# Patient Record
Sex: Male | Born: 1935 | Race: White | Hispanic: No | Marital: Single | Smoking: Never smoker
Health system: Southern US, Community
[De-identification: ages and names within clinical notes are randomized; demographics above are authoritative.]

## PROBLEM LIST (undated history)

## (undated) DIAGNOSIS — Z87442 Personal history of urinary calculi: Secondary | ICD-10-CM

## (undated) DIAGNOSIS — E118 Type 2 diabetes mellitus with unspecified complications: Secondary | ICD-10-CM

## (undated) DIAGNOSIS — K219 Gastro-esophageal reflux disease without esophagitis: Secondary | ICD-10-CM

## (undated) DIAGNOSIS — K635 Polyp of colon: Secondary | ICD-10-CM

## (undated) DIAGNOSIS — I499 Cardiac arrhythmia, unspecified: Secondary | ICD-10-CM

## (undated) DIAGNOSIS — M1990 Unspecified osteoarthritis, unspecified site: Secondary | ICD-10-CM

## (undated) DIAGNOSIS — N200 Calculus of kidney: Secondary | ICD-10-CM

## (undated) DIAGNOSIS — R7303 Prediabetes: Secondary | ICD-10-CM

## (undated) DIAGNOSIS — C61 Malignant neoplasm of prostate: Secondary | ICD-10-CM

## (undated) DIAGNOSIS — Z9289 Personal history of other medical treatment: Secondary | ICD-10-CM

## (undated) DIAGNOSIS — K602 Anal fissure, unspecified: Secondary | ICD-10-CM

## (undated) DIAGNOSIS — I4891 Unspecified atrial fibrillation: Secondary | ICD-10-CM

## (undated) DIAGNOSIS — M549 Dorsalgia, unspecified: Secondary | ICD-10-CM

## (undated) DIAGNOSIS — E785 Hyperlipidemia, unspecified: Secondary | ICD-10-CM

## (undated) DIAGNOSIS — I509 Heart failure, unspecified: Secondary | ICD-10-CM

## (undated) DIAGNOSIS — C439 Malignant melanoma of skin, unspecified: Secondary | ICD-10-CM

## (undated) DIAGNOSIS — Z9889 Other specified postprocedural states: Secondary | ICD-10-CM

## (undated) DIAGNOSIS — I2510 Atherosclerotic heart disease of native coronary artery without angina pectoris: Secondary | ICD-10-CM

## (undated) DIAGNOSIS — G4730 Sleep apnea, unspecified: Secondary | ICD-10-CM

## (undated) DIAGNOSIS — I10 Essential (primary) hypertension: Secondary | ICD-10-CM

## (undated) HISTORY — DX: Personal history of other medical treatment: Z92.89

## (undated) HISTORY — DX: Hyperlipidemia, unspecified: E78.5

## (undated) HISTORY — DX: Essential (primary) hypertension: I10

## (undated) HISTORY — DX: Heart failure, unspecified: I50.9

## (undated) HISTORY — DX: Anal fissure, unspecified: K60.2

## (undated) HISTORY — PX: URETHRAL STRICTURE DILATATION: SHX477

## (undated) HISTORY — DX: Other specified postprocedural states: Z98.890

## (undated) HISTORY — DX: Calculus of kidney: N20.0

## (undated) HISTORY — PX: UPPER GASTROINTESTINAL ENDOSCOPY: SHX188

## (undated) HISTORY — DX: Polyp of colon: K63.5

## (undated) HISTORY — DX: Type 2 diabetes mellitus with unspecified complications: E11.8

## (undated) HISTORY — DX: Atherosclerotic heart disease of native coronary artery without angina pectoris: I25.10

## (undated) HISTORY — DX: Prediabetes: R73.03

## (undated) HISTORY — DX: Unspecified atrial fibrillation: I48.91

## (undated) HISTORY — DX: Malignant neoplasm of prostate: C61

---
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
## 1978-08-10 HISTORY — PX: THROAT SURGERY: SHX803

## 1987-08-11 DIAGNOSIS — Z9889 Other specified postprocedural states: Secondary | ICD-10-CM

## 1987-08-11 HISTORY — DX: Other specified postprocedural states: Z98.890

## 1992-05-23 DIAGNOSIS — C4492 Squamous cell carcinoma of skin, unspecified: Secondary | ICD-10-CM

## 1992-05-23 HISTORY — DX: Squamous cell carcinoma of skin, unspecified: C44.92

## 2000-08-10 HISTORY — PX: POLYPECTOMY: SHX149

## 2000-08-10 HISTORY — PX: COLONOSCOPY: SHX174

## 2001-05-11 ENCOUNTER — Encounter (INDEPENDENT_AMBULATORY_CARE_PROVIDER_SITE_OTHER): Payer: Self-pay

## 2001-05-11 ENCOUNTER — Other Ambulatory Visit: Admission: RE | Admit: 2001-05-11 | Discharge: 2001-05-11 | Payer: Self-pay | Admitting: Gastroenterology

## 2001-06-17 ENCOUNTER — Ambulatory Visit (HOSPITAL_COMMUNITY): Admission: RE | Admit: 2001-06-17 | Discharge: 2001-06-17 | Payer: Self-pay | Admitting: Neurological Surgery

## 2001-06-17 ENCOUNTER — Encounter: Payer: Self-pay | Admitting: Neurological Surgery

## 2003-06-13 ENCOUNTER — Ambulatory Visit (HOSPITAL_COMMUNITY): Admission: RE | Admit: 2003-06-13 | Discharge: 2003-06-13 | Payer: Self-pay | Admitting: Neurological Surgery

## 2004-05-08 ENCOUNTER — Encounter: Admission: RE | Admit: 2004-05-08 | Discharge: 2004-05-08 | Payer: Self-pay | Admitting: Urology

## 2004-05-14 ENCOUNTER — Ambulatory Visit (HOSPITAL_COMMUNITY): Admission: RE | Admit: 2004-05-14 | Discharge: 2004-05-14 | Payer: Self-pay | Admitting: Urology

## 2004-05-14 ENCOUNTER — Ambulatory Visit (HOSPITAL_BASED_OUTPATIENT_CLINIC_OR_DEPARTMENT_OTHER): Admission: RE | Admit: 2004-05-14 | Discharge: 2004-05-14 | Payer: Self-pay | Admitting: Urology

## 2005-06-14 ENCOUNTER — Ambulatory Visit (HOSPITAL_COMMUNITY): Admission: RE | Admit: 2005-06-14 | Discharge: 2005-06-14 | Payer: Self-pay | Admitting: Neurological Surgery

## 2006-02-18 ENCOUNTER — Encounter: Admission: RE | Admit: 2006-02-18 | Discharge: 2006-02-18 | Payer: Self-pay | Admitting: Internal Medicine

## 2007-09-01 ENCOUNTER — Emergency Department (HOSPITAL_COMMUNITY): Admission: EM | Admit: 2007-09-01 | Discharge: 2007-09-02 | Payer: Self-pay | Admitting: Emergency Medicine

## 2008-03-16 ENCOUNTER — Ambulatory Visit (HOSPITAL_BASED_OUTPATIENT_CLINIC_OR_DEPARTMENT_OTHER): Admission: RE | Admit: 2008-03-16 | Discharge: 2008-03-16 | Payer: Self-pay | Admitting: Internal Medicine

## 2008-08-10 DIAGNOSIS — C61 Malignant neoplasm of prostate: Secondary | ICD-10-CM

## 2008-08-10 HISTORY — DX: Malignant neoplasm of prostate: C61

## 2009-01-25 ENCOUNTER — Emergency Department (HOSPITAL_COMMUNITY): Admission: EM | Admit: 2009-01-25 | Discharge: 2009-01-25 | Payer: Self-pay | Admitting: Emergency Medicine

## 2009-01-30 ENCOUNTER — Ambulatory Visit (HOSPITAL_COMMUNITY): Admission: RE | Admit: 2009-01-30 | Discharge: 2009-01-30 | Payer: Self-pay | Admitting: Urology

## 2009-02-05 ENCOUNTER — Ambulatory Visit: Admission: RE | Admit: 2009-02-05 | Discharge: 2009-04-17 | Payer: Self-pay | Admitting: Radiation Oncology

## 2009-02-28 ENCOUNTER — Encounter: Admission: RE | Admit: 2009-02-28 | Discharge: 2009-02-28 | Payer: Self-pay | Admitting: Urology

## 2009-03-13 ENCOUNTER — Ambulatory Visit (HOSPITAL_BASED_OUTPATIENT_CLINIC_OR_DEPARTMENT_OTHER): Admission: RE | Admit: 2009-03-13 | Discharge: 2009-03-13 | Payer: Self-pay | Admitting: Urology

## 2009-03-13 HISTORY — PX: INSERTION PROSTATE RADIATION SEED: SUR718

## 2010-02-13 DIAGNOSIS — Z9289 Personal history of other medical treatment: Secondary | ICD-10-CM

## 2010-02-13 HISTORY — DX: Personal history of other medical treatment: Z92.89

## 2010-11-16 LAB — COMPREHENSIVE METABOLIC PANEL
Alkaline Phosphatase: 42 U/L (ref 39–117)
BUN: 15 mg/dL (ref 6–23)
CO2: 27 mEq/L (ref 19–32)
Calcium: 9.3 mg/dL (ref 8.4–10.5)
GFR calc non Af Amer: >60 mL/min (ref >60)
Glucose, Bld: 121 mg/dL — ABNORMAL HIGH (ref 70–99)
Potassium: 4.4 mEq/L (ref 3.5–5.1)
Total Protein: 6.4 g/dL (ref 6.0–8.3)

## 2010-11-16 LAB — CBC
MCHC: 34 g/dL (ref 30.0–36.0)
MCV: 90.7 fL (ref 78.0–100.0)
RBC: 4.6 MIL/uL (ref 4.22–5.81)
RDW: 13.3 % (ref 11.5–15.5)
WBC: 6.4 10*3/uL (ref 4.0–10.5)

## 2010-11-16 LAB — PROTIME-INR
INR: 1 (ref 0.00–1.49)
Prothrombin Time: 13.9 seconds (ref 11.6–15.2)

## 2010-11-17 LAB — POCT I-STAT, CHEM 8
Chloride: 109 mEq/L (ref 96–112)
Glucose, Bld: 133 mg/dL — ABNORMAL HIGH (ref 70–99)
HCT: 38 % — ABNORMAL LOW (ref 39.0–52.0)
Hemoglobin: 12.9 g/dL — ABNORMAL LOW (ref 13.0–17.0)
Potassium: 4.6 mEq/L (ref 3.5–5.1)
Sodium: 140 mEq/L (ref 135–145)

## 2010-11-17 LAB — POCT CARDIAC MARKERS
CKMB, poc: <1 ng/mL — ABNORMAL LOW (ref 1.0–8.0)
Myoglobin, poc: 133 ng/mL (ref 12–200)
Troponin i, poc: <0.05 ng/mL (ref 0.00–0.09)

## 2010-11-17 LAB — DIFFERENTIAL
Basophils Absolute: 0 10*3/uL (ref 0.0–0.1)
Basophils Relative: 0 % (ref 0–1)
Eosinophils Absolute: 0.1 10*3/uL (ref 0.0–0.7)
Monocytes Relative: 6 % (ref 3–12)
Neutro Abs: 6.7 10*3/uL (ref 1.7–7.7)
Neutrophils Relative %: 80 % — ABNORMAL HIGH (ref 43–77)

## 2010-11-17 LAB — CBC
MCHC: 33.9 g/dL (ref 30.0–36.0)
Platelets: 133 10*3/uL — ABNORMAL LOW (ref 150–400)
RBC: 4.34 MIL/uL (ref 4.22–5.81)

## 2010-12-01 ENCOUNTER — Telehealth: Payer: Self-pay | Admitting: Cardiology

## 2010-12-01 NOTE — Telephone Encounter (Signed)
WANTED TO KNOW IF HE SHOULD COME IN TO SEE DR. Deborah Chalk. HE WAS HERE IN July 2011. PLS CALL HIM BACK TO LET HIM KNOW WHAT TO DO.

## 2010-12-02 NOTE — Telephone Encounter (Signed)
RN scheduled pt to see Dr. Deborah Chalk on 12/29/10.  Pt aware.

## 2010-12-23 NOTE — Op Note (Signed)
NAME:  Antonio Clark, Antonio Clark                 ACCOUNT NO.:  [id number]   MEDICAL RECORD NO.:  [id number]          PATIENT TYPE:  AMB   LOCATION:  [facility]                         FACILITY:  [facility]   PHYSICIAN:  Ronald L. Earlene Plater, M.D.  DATE OF BIRTH:  29-Nov-1935   DATE OF PROCEDURE:  03/13/2009  DATE OF DISCHARGE:                               OPERATIVE REPORT   DIAGNOSES:  Adenocarcinoma of the prostate and urethral stricture  disease.   OPERATIVE PROCEDURE:  Cystourethroscopy, visual internal urethrotomy,  radioactive seed implantation with iodine 125 isotope   SURGEON:  Ronald L. Earlene Plater, M.D.   ASSISTANT:  Maryln Gottron, M.D.   ANESTHESIA:  LMA.   ESTIMATED BLOOD LOSS:  15 mL.   TUBES:  A 20-French Foley.   COMPLICATIONS:  None.   A total of 91 seeds were implanted with 29 needles at 50.2320 mCi total  apparent activity.   INDICATIONS FOR PROCEDURE:  Mr. Eberlein is a very nice 75 year old [place]  male who originally had a low grade prostate cancer and underwent  expected management protocol.  He subsequently underwent a repeat biopsy  of his prostate.  His PSA was quite low at 1.88.  However, he was found  to have a nidus of Gleason score 7 or 3 + 4 and 15% on one core from the  right mid prostate and 3 + 3 or 6 from the right apex.  His metastatic  workup was negative and after understanding the risks, benefits and  alternatives he has elected proceed with seed implantation.  He has been  properly simulated and properly informed.  In addition, he has known  urethral stricture disease and has had some slowing of flow and it was  felt that we would be set up to perform an optical urethrotomy at the  time of the procedure.   PROCEDURE IN DETAIL:  The patient was placed in the supine position.  After proper LMA anesthesia was placed in the dorsal lithotomy position,  prepped and draped in a sterile fashion.  Cystourethroscopy was  performed with an optical urethrotome.  There was a  filamentous fairly  tight, deep bulbar urethral stricture just distal to the membranous  urethra.  It was approximately 5 mm in length.  Utilizing the cold knife  it was incised at the 12 o'clock position to bleeding tissue and opened  quite nicely.  The optical urethrotome was removed and a 20-French Foley  catheter was inserted with ease and the seed procedure was approached.  The transrectal ultrasound was performed with B and K biplanar  ultrasound and both axial and sagittal scans were performed.  Two  holding needles were placed in unused coordinates, utilizing both the  electronic and physical grid and replanting was performed and augmented  at surgery.  We were very comfortable with the plan as is documented in  the record.  A base needle was placed and the machine calibrated and  serial implantation was then performed and planned coordinates.  A total  of 91, I-125 seeds were planted with 29 needles with a total apparent  activity of 50.2320 mCi.  Following implantation, we were very  comfortable with the location.  All hardware was removed and a static  image was taken fluoroscopically and again, we were comfortable with the  location of the seeds.  The patient was then placed in the supine  position.  The Foley catheter was removed and scanned for seeds and  there were none within it.  Flexible cystourethroscopy was then  performed.  The urethra was widely patent.  The bladder was smooth wall  with grade 1 trabeculation.  There were no lesions noted.  Efflux of  clear urine was noted from normally placed ureteral orifices  bilaterally.  There were no seeds or spacers or lesions in the urethra  or in the bladder.  The flexible cystourethroscope was visually removed.  A new 20-French Foley catheter was inserted.  The bladder was drained  and the patient was taken to the recovery room stable.      Ronald L. Earlene Plater, M.D.  Electronically Signed     RLD/MEDQ  D:  03/13/2009   T:  03/13/2009  Job:  [id number]

## 2010-12-23 NOTE — Consult Note (Signed)
NAMEPAYTEN, BEAUMIER NO.:  [id number]   MEDICAL RECORD NO.:  [id number]          PATIENT TYPE:  EMS   LOCATION:  ED                           FACILITY:  [facility]   PHYSICIAN:  Lindaann Slough, M.D.  DATE OF BIRTH:  05-13-1936   DATE OF CONSULTATION:  09/01/2007  DATE OF DISCHARGE:  09/02/2007                                 CONSULTATION   REASON FOR CONSULTATION:  Hematuria and suprapubic pain.   The patient is 75 year old male patient of Dr. Earlene Plater who had urethral  dilation on September 01, 2007.  He returned to the office in the  afternoon and Dr. Earlene Plater did a cystoscopy and inserted a Foley catheter  in the bladder.  He did not have any blood clots in the bladder at that  time.  He was sent home; however, he returned to the emergency room in  the evening of September 01, 2007 complaining of suprapubic pain and the  catheter not draining.  The catheter was replaced with a #22 [place];  however, he continued to have pain.  The catheter was irrigated with  normal saline and there were no blood clots; however, he still continued  to complain of pain.  I was then asked to see the patient for further  evaluation.   I irrigated the catheter with normal saline and there were no blood  clots and the return from the irrigation was clear.  The patient  continued to complain of pain.  At that time I was concerned that he had  a bladder full of blood clots, and I decided to do a cystoscopy.  I  passed a flexible cystoscope in the bladder.  The anterior urethra is  normal.  The prostatic urethra is reddened and there is some edema at  the bladder neck.  There are no blood clots in the bladder.  There is no  tumor in the bladder.  The ureteral orifices are in normal position and  shape.  I then removed the cystoscope.   I inserted a #20 [place] coude catheter in the bladder and the catheter  was draining well.  I gave him an injection of 15 mg of morphine IM.   He was discharged  home and he will follow up with Dr. Earlene Plater.      Lindaann Slough, M.D.  Electronically Signed     MN/MEDQ  D:  09/02/2007  T:  09/02/2007  Job:  [id number]

## 2010-12-23 NOTE — Op Note (Signed)
NAME:  Antonio Clark, Antonio Clark                 ACCOUNT NO.:  [id number]   MEDICAL RECORD NO.:  [id number]          PATIENT TYPE:  AMB   LOCATION:  [facility]                         FACILITY:  [facility]   PHYSICIAN:  Ronald L. Earlene Plater, M.D.  DATE OF BIRTH:  [birth year]   DATE OF PROCEDURE:  03/16/2008  DATE OF DISCHARGE:                               OPERATIVE REPORT   __________   PREOPERATIVE DIAGNOSIS:  Bulbar urethral stricture.   POSTOPERATIVE DIAGNOSIS:  Bulbar urethral stricture.   PROCEDURES:  1. Cystourethroscopy.  2. DVIU of bulbar urethral stricture.  3. Placement of 20-French Foley catheter.  4. We used a 26-French resectoscope with a straight cold knife along      with a 0 degree lens and sterile water as our irrigant.   ATTENDING PHYSICIAN:  Dr. Maudie Flakes.   ANESTHESIA:  General.   INDICATIONS FOR PROCEDURE:  Antonio Clark is a 75 year old [place] male with  past medical history positive for urethral stricture.  He has had direct  visual internal urethrotomy in the past and the stricture has recurred.  He does have symptoms from this.  Preoperative counseling included  risks, benefits, consequences and concerns and options and informed  consent was obtained.   PROCEDURE IN DETAIL:  The patient was brought to the operating room and  placed in a supine position.  He was correctly identified by his  wristband and an appropriate timeout was taken.  General anesthesia was  delivered and IV antibiotics were administered.  He was placed in dorsal  lithotomy position with great care taken to minimize the risk of  peripheral neuropathy or compartment syndrome.  His perineum was prepped  and draped sterilely.  We began our procedure by performing a rigid  ureteroscopy with a 26-French rigid resectoscope.  We placed the sheath  over an obturator through a slightly narrowed fossa navicularis.  There  was some minimal bleeding from this.  The rest of the anterior urethra  was widely patent until the  bulbar urethra was encountered, where there  was a circumferential area of stricture noted by pale mucosa.  The  stricture measured approximately 1 cm.  In the background, the external  sphincter was visualized.  We were able to use a straight cold knife and  incised the stricture at the 12 o'clock position in the midline and  carried it down to pink and viable tissue.  There was no significant  bleeding.  Once our incision had been completed, we were able to advance  the resectoscope through it without any significant resistance.  We  continued our urethroscopy, which noted a normal wall membranous and  prostatic urethra with a high median bar.  Upon entering the bladder,  clear urine was identified.  Both ureteral orifices were noted to be in  the normal anatomic position, effluxing clear urine.  He had grade 1-2  trabeculations, but no urothelial abnormalities were appreciated.  We  then removed the resectoscope in an antegrade fashion, once again  inspecting our incision.  There was no significant bleeding.  We then  placed a 20-French catheter  transurethrally into the bladder without  resistance  or difficulty, inflated with 10 mL of sterile water and this marked the  end of our procedure.  He awoke from anesthesia and was taken to the  recovery room in stable condition.  He tolerated the procedure well.  There were no complications.  Dr. Earlene Plater was present and participated in  all aspects of the case.     ______________________________  Laury Deep, MD      Lucrezia Starch. Earlene Plater, M.D.  Electronically Signed    DW/MEDQ  D:  03/16/2008  T:  03/16/2008  Job:  [id number]

## 2010-12-26 ENCOUNTER — Encounter: Payer: Self-pay | Admitting: Cardiology

## 2010-12-26 NOTE — Op Note (Signed)
NAME:  Antonio Clark, Antonio Clark                 ACCOUNT NO.:  [id number]   MEDICAL RECORD NO.:  [id number]          PATIENT TYPE:  AMB   LOCATION:  [facility]                         FACILITY:  [facility]   PHYSICIAN:  Ronald L. Ovidio Hanger, M.D.DATE OF BIRTH:  1936/04/01   DATE OF PROCEDURE:  05/14/2004  DATE OF DISCHARGE:                                 OPERATIVE REPORT   PREOPERATIVE DIAGNOSIS:  Urethral stricture disease.   POSTOPERATIVE DIAGNOSIS:  Urethral stricture disease.   PROCEDURE:  Cystourethroscopy, optical urethrotomy.   SURGEON:  Lucrezia Starch. Earlene Plater, M.D.   ANESTHESIA:  LMA.   ESTIMATED BLOOD LOSS:  10 cc.   TUBES:  22 French Foley.   COMPLICATIONS:  None.   INDICATIONS FOR PROCEDURE:  Mr. Glasscock is a very nice 75 year old [place] male  with known urethral stricture disease.  He came in with great difficulty  urinating and had a very tight bulbar urethral stricture that could only be  dilated to 30 [place] with filiforms and followers.  After understanding the  risks, benefits, and alternatives, he elected to proceed with optical  urethrotomy.   DESCRIPTION OF PROCEDURE:  The patient was placed in the supine position.  After proper LMA anesthesia, he was placed in the dorsal lithotomy position  and prepped and draped with Betadine in sterile fashion.   Cystourethroscopy was performed with a 22.5 [place] Olympus panendoscope.  In  the deep bulbar urethra, there was an approximately 0.5-cm fairly tight  stricture but was well distal to the sphincter.  Utilizing the optical  urethrotome at the 12 o'clock position, the stricture was incised to healthy  tissue, and it opened it widely.  Cystourethroscopy was then performed with  the optical urethrotome.  Utilizing the 12 and 70-degree lenses, the bladder  was carefully inspected.  Grade 1 trabeculation was noted.  Efflux of clear  urine was noted from the normally placed ureteral orifices bilaterally.  There was moderate trilobar hypertrophy.   The scope was removed, and a 22  French 5 cc balloon Foley was passed.  The bladder was irrigated clear.   The patient was taken to the recovery room in stable condition.    RLD/MEDQ  D:  05/14/2004  T:  05/14/2004  Job:  [id number]

## 2010-12-29 ENCOUNTER — Ambulatory Visit (INDEPENDENT_AMBULATORY_CARE_PROVIDER_SITE_OTHER): Payer: Medicare Other | Admitting: Cardiology

## 2010-12-29 ENCOUNTER — Encounter: Payer: Self-pay | Admitting: Cardiology

## 2010-12-29 DIAGNOSIS — E785 Hyperlipidemia, unspecified: Secondary | ICD-10-CM | POA: Insufficient documentation

## 2010-12-29 DIAGNOSIS — R7303 Prediabetes: Secondary | ICD-10-CM | POA: Insufficient documentation

## 2010-12-29 DIAGNOSIS — I10 Essential (primary) hypertension: Secondary | ICD-10-CM | POA: Insufficient documentation

## 2010-12-29 DIAGNOSIS — E119 Type 2 diabetes mellitus without complications: Secondary | ICD-10-CM | POA: Insufficient documentation

## 2010-12-29 NOTE — Progress Notes (Signed)
Subjective:   Antonio Clark is seen today for followup visit. He has a strong family history of heart disease.  Overall, he is continued to do well. He is a strong family history of heart disease with his brother is dying after having bypass surgery. He walks on a daily basis. He stopped taking statin therapy because of aching in his legs he is currently taking red yeast rice extract. I've asked him to bring in lab work for me to review. I encouraged him to stay on statin therapy. Overall, he continues to do well. He did have a history of prostate cancer treated with seed implants in August of 2010.  Current Outpatient Prescriptions  Medication Sig Dispense Refill  . aspirin 81 MG tablet Take 81 mg by mouth daily.        . Flaxseed, Linseed, (FLAX SEED OIL) 1000 MG CAPS Take by mouth daily.        Marland Kitchen losartan-hydrochlorothiazide (HYZAAR) 100-12.5 MG per tablet Take 1 tablet by mouth daily.        . multivitamin (THERAGRAN) per tablet Take 1 tablet by mouth daily.        . Omega-3 Fatty Acids (FISH OIL) 1200 MG CAPS Take 1,200 mg by mouth daily.        . Red Yeast Rice 600 MG TABS Take 1,200 mg by mouth daily.        . Tamsulosin HCl (FLOMAX) 0.4 MG CAPS Take 0.4 mg by mouth daily.        Marland Kitchen DISCONTD: simvastatin (ZOCOR) 40 MG tablet Take 40 mg by mouth at bedtime.          No Known Allergies  Patient Active Problem List  Diagnoses  . Hyperlipidemia  . Borderline diabetic  . Hypertension    History  Smoking status  . Never Smoker   Smokeless tobacco  . Never Used    History  Alcohol Use No    Family History  Problem Relation Age of Onset  . Coronary artery disease Brother     3 brothers had CABG  . Arrhythmia Brother   . Heart failure Brother   . Heart disease Father   . Hypertension Father   . Stroke Mother     cerebral hemorrage  . Suicidality Sister   . Sudden death Father     Review of Systems:   The patient denies any heat or cold intolerance.  No weight gain or weight  loss.  The patient denies headaches or blurry vision.  There is no cough or sputum production.  The patient denies dizziness.  There is no hematuria or hematochezia.  The patient denies any muscle aches or arthritis.  The patient denies any rash.  The patient denies frequent falling or instability.  There is no history of depression or anxiety.  All other systems were reviewed and are negative.   Physical Exam:   Weight is 198. Blood pressure is 108/64, heart rate is 88.The head is normocephalic and atraumatic.  Pupils are equally round and reactive to light.  Sclerae nonicteric.  Conjunctiva is clear.  Oropharynx is unremarkable.  There's adequate oral airway.  Neck is supple there are no masses.  Thyroid is not enlarged.  There is no lymphadenopathy.  Lungs are clear.  Chest is symmetric.  Heart shows a regular rate and rhythm.  S1 and S2 are normal.  There is no murmur click or gallop.  Abdomen is soft normal bowel sounds.  There is no organomegaly.  Genital  and rectal deferred.  Extremities are without edema.  Peripheral pulses are adequate.  Neurologically intact.  Full range of motion.  The patient is not depressed.  Skin is warm and dry.  Assessment / Plan:

## 2010-12-29 NOTE — Assessment & Plan Note (Signed)
Blood pressure has been well-controlled. We'll continue current medications. I will have him see Dr. Shirlee Latch in approximately one year.

## 2010-12-29 NOTE — Assessment & Plan Note (Signed)
I will review recent lab work. I've encouraged him to stay on a statin therapy if at all possible.  I will have followup with Dr. Shirlee Latch because of the markedly positive family history of heart disease

## 2011-01-21 ENCOUNTER — Emergency Department (HOSPITAL_COMMUNITY)
Admission: EM | Admit: 2011-01-21 | Discharge: 2011-01-21 | Disposition: A | Discharge status: Home / Self Care | Payer: Medicare Other | Attending: Emergency Medicine | Admitting: Emergency Medicine

## 2011-01-21 ENCOUNTER — Emergency Department (HOSPITAL_COMMUNITY): Payer: Medicare Other

## 2011-01-21 DIAGNOSIS — R5383 Other fatigue: Secondary | ICD-10-CM | POA: Insufficient documentation

## 2011-01-21 DIAGNOSIS — R5381 Other malaise: Secondary | ICD-10-CM | POA: Insufficient documentation

## 2011-01-21 DIAGNOSIS — I10 Essential (primary) hypertension: Secondary | ICD-10-CM | POA: Insufficient documentation

## 2011-01-21 DIAGNOSIS — R112 Nausea with vomiting, unspecified: Secondary | ICD-10-CM | POA: Insufficient documentation

## 2011-01-21 DIAGNOSIS — E7800 Pure hypercholesterolemia, unspecified: Secondary | ICD-10-CM | POA: Insufficient documentation

## 2011-01-21 LAB — CBC
HCT: 39.4 % (ref 39.0–52.0)
MCHC: 34.5 g/dL (ref 30.0–36.0)
Platelets: 108 10*3/uL — ABNORMAL LOW (ref 150–400)
RDW: 13.1 % (ref 11.5–15.5)
WBC: 7.6 10*3/uL (ref 4.0–10.5)

## 2011-01-21 LAB — DIFFERENTIAL
Basophils Absolute: 0 10*3/uL (ref 0.0–0.1)
Eosinophils Absolute: 0.1 10*3/uL (ref 0.0–0.7)
Eosinophils Relative: 2 % (ref 0–5)
Monocytes Absolute: 0.3 10*3/uL (ref 0.1–1.0)

## 2011-01-21 LAB — URINALYSIS, ROUTINE W REFLEX MICROSCOPIC
Bilirubin Urine: NEGATIVE
Hgb urine dipstick: NEGATIVE
Ketones, ur: NEGATIVE mg/dL
Specific Gravity, Urine: 1.023 (ref 1.005–1.030)
pH: 5 (ref 5.0–8.0)

## 2011-01-21 LAB — CK TOTAL AND CKMB (NOT AT ARMC)
CK, MB: 3.2 ng/mL (ref 0.3–4.0)
CK, MB: 3.3 ng/mL (ref 0.3–4.0)
Relative Index: 1.4 (ref 0.0–2.5)
Relative Index: 1.5 (ref 0.0–2.5)
Total CK: 216 U/L (ref 7–232)

## 2011-01-21 LAB — BASIC METABOLIC PANEL
BUN: 18 mg/dL (ref 6–23)
Chloride: 102 mEq/L (ref 96–112)
Creatinine, Ser: 0.96 mg/dL (ref 0.4–1.5)
GFR calc Af Amer: >60 mL/min (ref >60)
GFR calc non Af Amer: >60 mL/min (ref >60)
Glucose, Bld: 175 mg/dL — ABNORMAL HIGH (ref 70–99)
Potassium: 3.4 mEq/L — ABNORMAL LOW (ref 3.5–5.1)

## 2011-01-21 LAB — URINE MICROSCOPIC-ADD ON

## 2011-01-21 LAB — TROPONIN I: Troponin I: <0.3 ng/mL (ref <0.30)

## 2011-04-15 ENCOUNTER — Other Ambulatory Visit: Payer: Self-pay | Admitting: Dermatology

## 2011-04-30 LAB — URINE MICROSCOPIC-ADD ON

## 2011-04-30 LAB — URINALYSIS, ROUTINE W REFLEX MICROSCOPIC
Nitrite: NEGATIVE
Specific Gravity, Urine: 1.005
pH: 5

## 2011-05-08 LAB — POCT I-STAT 4, (NA,K, GLUC, HGB,HCT): Glucose, Bld: 112 — ABNORMAL HIGH

## 2011-06-22 DIAGNOSIS — N35919 Unspecified urethral stricture, male, unspecified site: Secondary | ICD-10-CM | POA: Insufficient documentation

## 2011-08-10 ENCOUNTER — Emergency Department (HOSPITAL_COMMUNITY)
Admission: EM | Admit: 2011-08-10 | Discharge: 2011-08-10 | Discharge status: Against Medical Advice | Payer: Medicare Other | Attending: Emergency Medicine | Admitting: Emergency Medicine

## 2011-08-10 DIAGNOSIS — Z0389 Encounter for observation for other suspected diseases and conditions ruled out: Secondary | ICD-10-CM | POA: Insufficient documentation

## 2011-08-13 DIAGNOSIS — C61 Malignant neoplasm of prostate: Secondary | ICD-10-CM | POA: Diagnosis not present

## 2011-08-13 DIAGNOSIS — N529 Male erectile dysfunction, unspecified: Secondary | ICD-10-CM | POA: Diagnosis not present

## 2011-08-13 DIAGNOSIS — N400 Enlarged prostate without lower urinary tract symptoms: Secondary | ICD-10-CM | POA: Diagnosis not present

## 2011-08-13 DIAGNOSIS — N35919 Unspecified urethral stricture, male, unspecified site: Secondary | ICD-10-CM | POA: Diagnosis not present

## 2011-09-23 DIAGNOSIS — L570 Actinic keratosis: Secondary | ICD-10-CM | POA: Diagnosis not present

## 2011-10-26 ENCOUNTER — Other Ambulatory Visit: Payer: Self-pay | Admitting: Dermatology

## 2011-10-26 DIAGNOSIS — D485 Neoplasm of uncertain behavior of skin: Secondary | ICD-10-CM | POA: Diagnosis not present

## 2011-10-26 DIAGNOSIS — D0439 Carcinoma in situ of skin of other parts of face: Secondary | ICD-10-CM | POA: Diagnosis not present

## 2011-10-26 DIAGNOSIS — L570 Actinic keratosis: Secondary | ICD-10-CM | POA: Diagnosis not present

## 2011-11-02 ENCOUNTER — Encounter: Payer: Self-pay | Admitting: Cardiology

## 2011-11-02 ENCOUNTER — Ambulatory Visit (INDEPENDENT_AMBULATORY_CARE_PROVIDER_SITE_OTHER): Payer: Medicare Other | Admitting: Cardiology

## 2011-11-02 VITALS — BP 132/78 | HR 89 | Ht 69.0 in | Wt 200.4 lb

## 2011-11-02 DIAGNOSIS — E785 Hyperlipidemia, unspecified: Secondary | ICD-10-CM

## 2011-11-02 DIAGNOSIS — R079 Chest pain, unspecified: Secondary | ICD-10-CM

## 2011-11-02 DIAGNOSIS — I2510 Atherosclerotic heart disease of native coronary artery without angina pectoris: Secondary | ICD-10-CM | POA: Insufficient documentation

## 2011-11-02 DIAGNOSIS — I10 Essential (primary) hypertension: Secondary | ICD-10-CM | POA: Diagnosis not present

## 2011-11-02 MED ORDER — PRAVASTATIN SODIUM 40 MG PO TABS: 40.0000 mg | ORAL_TABLET | Freq: Every evening | ORAL | Status: DC

## 2011-11-02 NOTE — Assessment & Plan Note (Signed)
Per patient, lipids were high when checked by his PCP.  He was on a statin in the past and had myalgias.  He takes red yeast rice extract which includes pravastatin.  Therefore, I will have him stop the red yeast rice extract and instead take pravastatin 40 mg daily.  I will get lipids/LFTs in 2 months, would ideally like to see LDL< 100 with his family history.

## 2011-11-02 NOTE — Assessment & Plan Note (Signed)
One episode of exertional chest pain (short-lived) this past weekend.  No recent stress test.  Risk factors include HTN, hyperlipidemia, and strong family history of CAD.  Given his worrisome family history, I think that the best course here will be risk stratification with ETT-myoview.  He will continue ASA 81.

## 2011-11-02 NOTE — Assessment & Plan Note (Signed)
BP controlled on current regimen.

## 2011-11-02 NOTE — Patient Instructions (Signed)
Stop red yeast rice extract.  Start pravachol 40mg  daily in the evening.  Your physician recommends that you return for a FASTING lipid profile /liver profile in 2 months.  Your physician has requested that you have en exercise stress myoview. For further information please visit [email or web address] Please follow instruction sheet, as given.  Your physician wants you to follow-up in: 6 months with Dr Shirlee Latch. (September 2013). You will receive a reminder letter in the mail two months in advance. If you don't receive a letter, please call our office to schedule the follow-up appointment.

## 2011-11-02 NOTE — Progress Notes (Signed)
PCP: Dr. Allyne Gee  76 yo with history of HTN, hyperlipidemia, and strong family history of CAD presents for cardiology followup.  He has been seen by Dr. Deborah Chalk in the past and is seen by me for the first time today.  He had an episode of chest pain this past weekend while walking fast to get to the [employer].  It was left-sided and dull, lasting for about 30 seconds.  He kept on walking and it resolved.  He has not had a recurrence.  Prior to this episode, he had had no chest pain for years.  He is in good shape in general, walking 3 miles/day and doing yardwork.  No exertional dyspnea.  He has a strong family history of CAD, with 3 brothers having CABG in their late 51s to 20s and his father having sudden cardiac death.  He took a statin in the past, he is not sure which, and had myalgias.  He is now taking red yeast rice extract.  BP is under good control on current regimen.   ECG: NSR, normal  PMH: 1. Prostate cancer: s/p seed implantation 8/10.  2. Hyperlipidemia: Myalgias with one of the statins, ? Crestor 3. HTN 4. Borderline diabetes mellitus 5. Stress test ? 2005 normal per patient's report  SH: Never smoked, lives in [place], retired from [employer] and [employer], never married  FH: 3 brothers with CAD, all found in late 34s-70s.  Father with sudden cardiac death.  Mother with CVA.   ROS: All systems reviewed and negative except as per HPI.   Current Outpatient Prescriptions  Medication Sig Dispense Refill  . aspirin 81 MG tablet Take 81 mg by mouth daily.        . Flaxseed, Linseed, (FLAX SEED OIL) 1000 MG CAPS Take by mouth daily.        Marland Kitchen losartan-hydrochlorothiazide (HYZAAR) 100-12.5 MG per tablet Take 1 tablet by mouth daily.        . multivitamin (THERAGRAN) per tablet Take 1 tablet by mouth daily.        . Omega-3 Fatty Acids (FISH OIL) 1200 MG CAPS Take 1,200 mg by mouth daily.        . Tamsulosin HCl (FLOMAX) 0.4 MG CAPS Take 0.4 mg by mouth daily.        .  pravastatin (PRAVACHOL) 40 MG tablet Take 1 tablet (40 mg total) by mouth every evening.  30 tablet  6    BP 132/78  Pulse 89  Ht 5\' 9"  (1.753 m)  Wt 200 lb 6.4 oz (90.901 kg)  BMI 29.59 kg/m2 General: NAD Neck: No JVD, no thyromegaly or thyroid nodule.  Lungs: Clear to auscultation bilaterally with normal respiratory effort. CV: Nondisplaced PMI.  Heart regular S1/S2, no S3/S4, no murmur.  No peripheral edema.  No carotid bruit.  Normal pedal pulses.  Abdomen: Soft, nontender, no hepatosplenomegaly, no distention.  Neurologic: Alert and oriented x 3.  Psych: Normal affect. Extremities: No clubbing or cyanosis. Marland Kitchen

## 2011-11-12 ENCOUNTER — Encounter (HOSPITAL_COMMUNITY): Payer: Medicare Other

## 2011-11-19 DIAGNOSIS — M5137 Other intervertebral disc degeneration, lumbosacral region: Secondary | ICD-10-CM | POA: Diagnosis not present

## 2011-11-19 DIAGNOSIS — S336XXA Sprain of sacroiliac joint, initial encounter: Secondary | ICD-10-CM | POA: Diagnosis not present

## 2011-11-19 DIAGNOSIS — IMO0002 Reserved for concepts with insufficient information to code with codable children: Secondary | ICD-10-CM | POA: Diagnosis not present

## 2011-11-19 DIAGNOSIS — M999 Biomechanical lesion, unspecified: Secondary | ICD-10-CM | POA: Diagnosis not present

## 2011-11-20 DIAGNOSIS — IMO0002 Reserved for concepts with insufficient information to code with codable children: Secondary | ICD-10-CM | POA: Diagnosis not present

## 2011-11-20 DIAGNOSIS — M5137 Other intervertebral disc degeneration, lumbosacral region: Secondary | ICD-10-CM | POA: Diagnosis not present

## 2011-11-20 DIAGNOSIS — M999 Biomechanical lesion, unspecified: Secondary | ICD-10-CM | POA: Diagnosis not present

## 2011-11-20 DIAGNOSIS — S336XXA Sprain of sacroiliac joint, initial encounter: Secondary | ICD-10-CM | POA: Diagnosis not present

## 2011-11-23 DIAGNOSIS — M5137 Other intervertebral disc degeneration, lumbosacral region: Secondary | ICD-10-CM | POA: Diagnosis not present

## 2011-11-23 DIAGNOSIS — IMO0002 Reserved for concepts with insufficient information to code with codable children: Secondary | ICD-10-CM | POA: Diagnosis not present

## 2011-11-23 DIAGNOSIS — S336XXA Sprain of sacroiliac joint, initial encounter: Secondary | ICD-10-CM | POA: Diagnosis not present

## 2011-11-23 DIAGNOSIS — M999 Biomechanical lesion, unspecified: Secondary | ICD-10-CM | POA: Diagnosis not present

## 2011-11-25 DIAGNOSIS — S336XXA Sprain of sacroiliac joint, initial encounter: Secondary | ICD-10-CM | POA: Diagnosis not present

## 2011-11-25 DIAGNOSIS — M5137 Other intervertebral disc degeneration, lumbosacral region: Secondary | ICD-10-CM | POA: Diagnosis not present

## 2011-11-25 DIAGNOSIS — M999 Biomechanical lesion, unspecified: Secondary | ICD-10-CM | POA: Diagnosis not present

## 2011-11-25 DIAGNOSIS — IMO0002 Reserved for concepts with insufficient information to code with codable children: Secondary | ICD-10-CM | POA: Diagnosis not present

## 2011-11-26 ENCOUNTER — Ambulatory Visit (HOSPITAL_COMMUNITY): Payer: Medicare Other | Attending: Cardiology | Admitting: Radiology

## 2011-11-26 VITALS — BP 155/85 | Ht 69.0 in | Wt 200.0 lb

## 2011-11-26 DIAGNOSIS — Z8249 Family history of ischemic heart disease and other diseases of the circulatory system: Secondary | ICD-10-CM | POA: Diagnosis not present

## 2011-11-26 DIAGNOSIS — I10 Essential (primary) hypertension: Secondary | ICD-10-CM | POA: Insufficient documentation

## 2011-11-26 DIAGNOSIS — I4949 Other premature depolarization: Secondary | ICD-10-CM | POA: Diagnosis not present

## 2011-11-26 DIAGNOSIS — E785 Hyperlipidemia, unspecified: Secondary | ICD-10-CM | POA: Diagnosis not present

## 2011-11-26 DIAGNOSIS — R079 Chest pain, unspecified: Secondary | ICD-10-CM | POA: Insufficient documentation

## 2011-11-26 MED ORDER — TECHNETIUM TC 99M TETROFOSMIN IV KIT
10.0000 | PACK | Freq: Once | INTRAVENOUS | Status: AC | PRN
  Administered 2011-11-26: 10 via INTRAVENOUS

## 2011-11-26 MED ORDER — TECHNETIUM TC 99M TETROFOSMIN IV KIT
30.0000 | PACK | Freq: Once | INTRAVENOUS | Status: AC | PRN
  Administered 2011-11-26: 30 via INTRAVENOUS

## 2011-11-26 NOTE — Progress Notes (Addendum)
[facility] SITE 3 NUCLEAR MED [address] [place] [address] [phone]  Cardiology Nuclear Med Study  Antonio Clark is a 76 y.o. male     MRN : [id number]     DOB: July 07, 1936  Procedure Date: 11/26/2011  Nuclear Med Background Indication for Stress Test:  Evaluation for Ischemia History: 2005 MPS: NL per pt  Cardiac Risk Factors: Family History - CAD, Hypertension and Lipids  Symptoms:  Chest Pain while walking fast   Nuclear Pre-Procedure Caffeine/Decaff Intake:  None NPO After: 7:00pm   Lungs:  clear O2 Sat: 96% on room air. IV 0.9% NS with Angio Cath:  20g  IV Site: R Antecubital  IV Started by:  Stanton Kidney, EMT-P  Chest Size (in):  44 Cup Size: n/a  Height: 5\' 9"  (1.753 m)  Weight:  200 lb (90.719 kg)  BMI:  Body mass index is 29.53 kg/(m^2). Tech Comments:  NA    Nuclear Med Study 1 or 2 day study: 1 day  Stress Test Type:  Stress  Reading MD: Willa Rough, MD  Order Authorizing Provider:  Shirlee Latch  Resting Radionuclide: Technetium 93m Tetrofosmin  Resting Radionuclide Dose: 10.7 mCi   Stress Radionuclide:  Technetium 13m Tetrofosmin  Stress Radionuclide Dose: 33.0 mCi           Stress Protocol Rest HR: 64 Stress HR: 155  Rest BP: 155/85 Stress BP: 208/66  Exercise Time (min): 10:30 METS: 12.50   Predicted Max HR: 147 bpm % Max HR: 105.44 bpm Rate Pressure Product: 29528   Dose of Adenosine (mg):  n/a Dose of Lexiscan: n/a mg  Dose of Atropine (mg): n/a Dose of Dobutamine: n/a mcg/kg/min (at max HR)  Stress Test Technologist: Milana Na, EMT-P  Nuclear Technologist:  Domenic Polite, CNMT     Rest Procedure:  Myocardial perfusion imaging was performed at rest 45 minutes following the intravenous administration of Technetium 41m Tetrofosmin. Rest ECG: SR with PVCs  Stress Procedure:  The patient performed treadmill exercise using a Bruce  Protocol for 10:30 minutes. The patient stopped due to fatigue and denied any chest pain.   There were no significant ST-T wave changes and freq multi focal pvcs/pacs.  Technetium 47m Tetrofosmin was injected at peak exercise and myocardial perfusion imaging was performed after a brief delay. Stress ECG: No significant change from baseline ECG  QPS Raw Data Images:  Patient motion noted; appropriate software correction applied. Stress Images:  Normal homogeneous uptake in all areas of the myocardium. Rest Images:  Normal homogeneous uptake in all areas of the myocardium. Subtraction (SDS):  No evidence of ischemia. Transient Ischemic Dilatation (Normal <1.22):  0.93 Lung/Heart Ratio (Normal <0.45):  0.42  Quantitative Gated Spect Images QGS EDV:  103 ml QGS ESV:  47 ml  Impression Exercise Capacity:  Good exercise capacity. BP Response:  Hypertensive blood pressure response. Clinical Symptoms:  No symptoms. ECG Impression:  No significant ST segment change suggestive of ischemia. Comparison with Prior Nuclear Study: No previous nuclear study performed  Overall Impression:  Normal stress nuclear study.  LV Ejection Fraction: 54%.  LV Wall Motion:  Normal Wall Motion  Willa Rough, MD   No evidence for ischemia or infarction.  Normal EF.  Good exercise tolerance.  Please inform patient.   Antonio Clark 11/27/2011 10:59 AM

## 2011-11-27 ENCOUNTER — Telehealth: Payer: Self-pay | Admitting: *Deleted

## 2011-11-27 DIAGNOSIS — IMO0002 Reserved for concepts with insufficient information to code with codable children: Secondary | ICD-10-CM | POA: Diagnosis not present

## 2011-11-27 DIAGNOSIS — S336XXA Sprain of sacroiliac joint, initial encounter: Secondary | ICD-10-CM | POA: Diagnosis not present

## 2011-11-27 DIAGNOSIS — M999 Biomechanical lesion, unspecified: Secondary | ICD-10-CM | POA: Diagnosis not present

## 2011-11-27 DIAGNOSIS — M5137 Other intervertebral disc degeneration, lumbosacral region: Secondary | ICD-10-CM | POA: Diagnosis not present

## 2011-11-27 NOTE — Telephone Encounter (Signed)
F/U  Patient would like return call to discuss f/u with nurse, he can be reached at [phone]

## 2011-11-27 NOTE — Telephone Encounter (Signed)
Called pt and LM on identified answering machine "stress test normal"--nt

## 2011-11-30 NOTE — Telephone Encounter (Signed)
LMTCB

## 2011-11-30 NOTE — Progress Notes (Signed)
LMTCB

## 2011-12-01 NOTE — Telephone Encounter (Signed)
F/U  Patient returning nurse AL call, he can be reached at [phone].

## 2011-12-01 NOTE — Telephone Encounter (Signed)
Spoke with pt about recent myoview results 

## 2011-12-01 NOTE — Progress Notes (Signed)
Discussed with pt

## 2011-12-04 DIAGNOSIS — M5137 Other intervertebral disc degeneration, lumbosacral region: Secondary | ICD-10-CM | POA: Diagnosis not present

## 2011-12-04 DIAGNOSIS — IMO0002 Reserved for concepts with insufficient information to code with codable children: Secondary | ICD-10-CM | POA: Diagnosis not present

## 2011-12-04 DIAGNOSIS — S336XXA Sprain of sacroiliac joint, initial encounter: Secondary | ICD-10-CM | POA: Diagnosis not present

## 2011-12-04 DIAGNOSIS — M999 Biomechanical lesion, unspecified: Secondary | ICD-10-CM | POA: Diagnosis not present

## 2011-12-07 DIAGNOSIS — M5137 Other intervertebral disc degeneration, lumbosacral region: Secondary | ICD-10-CM | POA: Diagnosis not present

## 2011-12-07 DIAGNOSIS — S336XXA Sprain of sacroiliac joint, initial encounter: Secondary | ICD-10-CM | POA: Diagnosis not present

## 2011-12-07 DIAGNOSIS — IMO0002 Reserved for concepts with insufficient information to code with codable children: Secondary | ICD-10-CM | POA: Diagnosis not present

## 2011-12-07 DIAGNOSIS — M999 Biomechanical lesion, unspecified: Secondary | ICD-10-CM | POA: Diagnosis not present

## 2011-12-08 DIAGNOSIS — Z8546 Personal history of malignant neoplasm of prostate: Secondary | ICD-10-CM | POA: Diagnosis not present

## 2011-12-08 DIAGNOSIS — D075 Carcinoma in situ of prostate: Secondary | ICD-10-CM | POA: Diagnosis not present

## 2011-12-08 DIAGNOSIS — E785 Hyperlipidemia, unspecified: Secondary | ICD-10-CM | POA: Diagnosis not present

## 2011-12-08 DIAGNOSIS — H25019 Cortical age-related cataract, unspecified eye: Secondary | ICD-10-CM | POA: Diagnosis not present

## 2011-12-08 DIAGNOSIS — Z79899 Other long term (current) drug therapy: Secondary | ICD-10-CM | POA: Diagnosis not present

## 2011-12-08 DIAGNOSIS — I10 Essential (primary) hypertension: Secondary | ICD-10-CM | POA: Diagnosis not present

## 2011-12-08 DIAGNOSIS — H52209 Unspecified astigmatism, unspecified eye: Secondary | ICD-10-CM | POA: Diagnosis not present

## 2011-12-10 ENCOUNTER — Other Ambulatory Visit: Payer: Self-pay | Admitting: Dermatology

## 2011-12-10 DIAGNOSIS — IMO0002 Reserved for concepts with insufficient information to code with codable children: Secondary | ICD-10-CM | POA: Diagnosis not present

## 2011-12-10 DIAGNOSIS — D485 Neoplasm of uncertain behavior of skin: Secondary | ICD-10-CM | POA: Diagnosis not present

## 2011-12-10 DIAGNOSIS — D0439 Carcinoma in situ of skin of other parts of face: Secondary | ICD-10-CM | POA: Diagnosis not present

## 2011-12-10 DIAGNOSIS — M5137 Other intervertebral disc degeneration, lumbosacral region: Secondary | ICD-10-CM | POA: Diagnosis not present

## 2011-12-10 DIAGNOSIS — L820 Inflamed seborrheic keratosis: Secondary | ICD-10-CM | POA: Diagnosis not present

## 2011-12-10 DIAGNOSIS — M999 Biomechanical lesion, unspecified: Secondary | ICD-10-CM | POA: Diagnosis not present

## 2011-12-10 DIAGNOSIS — S336XXA Sprain of sacroiliac joint, initial encounter: Secondary | ICD-10-CM | POA: Diagnosis not present

## 2011-12-15 DIAGNOSIS — S336XXA Sprain of sacroiliac joint, initial encounter: Secondary | ICD-10-CM | POA: Diagnosis not present

## 2011-12-15 DIAGNOSIS — M999 Biomechanical lesion, unspecified: Secondary | ICD-10-CM | POA: Diagnosis not present

## 2011-12-15 DIAGNOSIS — IMO0002 Reserved for concepts with insufficient information to code with codable children: Secondary | ICD-10-CM | POA: Diagnosis not present

## 2011-12-15 DIAGNOSIS — M5137 Other intervertebral disc degeneration, lumbosacral region: Secondary | ICD-10-CM | POA: Diagnosis not present

## 2011-12-17 DIAGNOSIS — C61 Malignant neoplasm of prostate: Secondary | ICD-10-CM | POA: Diagnosis not present

## 2011-12-17 DIAGNOSIS — N35919 Unspecified urethral stricture, male, unspecified site: Secondary | ICD-10-CM | POA: Diagnosis not present

## 2011-12-17 DIAGNOSIS — N400 Enlarged prostate without lower urinary tract symptoms: Secondary | ICD-10-CM | POA: Diagnosis not present

## 2011-12-17 DIAGNOSIS — N529 Male erectile dysfunction, unspecified: Secondary | ICD-10-CM | POA: Diagnosis not present

## 2011-12-23 DIAGNOSIS — M999 Biomechanical lesion, unspecified: Secondary | ICD-10-CM | POA: Diagnosis not present

## 2011-12-23 DIAGNOSIS — IMO0002 Reserved for concepts with insufficient information to code with codable children: Secondary | ICD-10-CM | POA: Diagnosis not present

## 2011-12-23 DIAGNOSIS — S336XXA Sprain of sacroiliac joint, initial encounter: Secondary | ICD-10-CM | POA: Diagnosis not present

## 2011-12-23 DIAGNOSIS — M5137 Other intervertebral disc degeneration, lumbosacral region: Secondary | ICD-10-CM | POA: Diagnosis not present

## 2012-01-05 ENCOUNTER — Other Ambulatory Visit (INDEPENDENT_AMBULATORY_CARE_PROVIDER_SITE_OTHER): Payer: Medicare Other

## 2012-01-05 ENCOUNTER — Other Ambulatory Visit: Payer: Medicare Other

## 2012-01-05 DIAGNOSIS — R079 Chest pain, unspecified: Secondary | ICD-10-CM

## 2012-01-05 LAB — HEPATIC FUNCTION PANEL
Albumin: 3.9 g/dL (ref 3.5–5.2)
Alkaline Phosphatase: 39 U/L (ref 39–117)
Total Protein: 6.7 g/dL (ref 6.0–8.3)

## 2012-01-05 LAB — LIPID PANEL
Cholesterol: 143 mg/dL (ref 0–200)
LDL Cholesterol: 61 mg/dL (ref 0–99)
Triglycerides: 183 mg/dL — ABNORMAL HIGH (ref 0.0–149.0)

## 2012-01-06 DIAGNOSIS — IMO0002 Reserved for concepts with insufficient information to code with codable children: Secondary | ICD-10-CM | POA: Diagnosis not present

## 2012-01-06 DIAGNOSIS — S336XXA Sprain of sacroiliac joint, initial encounter: Secondary | ICD-10-CM | POA: Diagnosis not present

## 2012-01-06 DIAGNOSIS — M999 Biomechanical lesion, unspecified: Secondary | ICD-10-CM | POA: Diagnosis not present

## 2012-01-06 DIAGNOSIS — M5137 Other intervertebral disc degeneration, lumbosacral region: Secondary | ICD-10-CM | POA: Diagnosis not present

## 2012-01-07 ENCOUNTER — Telehealth: Payer: Self-pay | Admitting: Cardiology

## 2012-01-07 NOTE — Telephone Encounter (Signed)
close

## 2012-01-18 DIAGNOSIS — N35919 Unspecified urethral stricture, male, unspecified site: Secondary | ICD-10-CM | POA: Diagnosis not present

## 2012-01-18 DIAGNOSIS — C61 Malignant neoplasm of prostate: Secondary | ICD-10-CM | POA: Diagnosis not present

## 2012-02-03 DIAGNOSIS — M5137 Other intervertebral disc degeneration, lumbosacral region: Secondary | ICD-10-CM | POA: Diagnosis not present

## 2012-02-03 DIAGNOSIS — S336XXA Sprain of sacroiliac joint, initial encounter: Secondary | ICD-10-CM | POA: Diagnosis not present

## 2012-02-03 DIAGNOSIS — IMO0002 Reserved for concepts with insufficient information to code with codable children: Secondary | ICD-10-CM | POA: Diagnosis not present

## 2012-02-03 DIAGNOSIS — M999 Biomechanical lesion, unspecified: Secondary | ICD-10-CM | POA: Diagnosis not present

## 2012-02-17 ENCOUNTER — Encounter: Payer: Self-pay | Admitting: Gastroenterology

## 2012-03-09 DIAGNOSIS — S336XXA Sprain of sacroiliac joint, initial encounter: Secondary | ICD-10-CM | POA: Diagnosis not present

## 2012-03-09 DIAGNOSIS — M5137 Other intervertebral disc degeneration, lumbosacral region: Secondary | ICD-10-CM | POA: Diagnosis not present

## 2012-03-09 DIAGNOSIS — IMO0002 Reserved for concepts with insufficient information to code with codable children: Secondary | ICD-10-CM | POA: Diagnosis not present

## 2012-03-09 DIAGNOSIS — M999 Biomechanical lesion, unspecified: Secondary | ICD-10-CM | POA: Diagnosis not present

## 2012-03-14 DIAGNOSIS — S336XXA Sprain of sacroiliac joint, initial encounter: Secondary | ICD-10-CM | POA: Diagnosis not present

## 2012-03-14 DIAGNOSIS — M5137 Other intervertebral disc degeneration, lumbosacral region: Secondary | ICD-10-CM | POA: Diagnosis not present

## 2012-03-14 DIAGNOSIS — M999 Biomechanical lesion, unspecified: Secondary | ICD-10-CM | POA: Diagnosis not present

## 2012-03-14 DIAGNOSIS — IMO0002 Reserved for concepts with insufficient information to code with codable children: Secondary | ICD-10-CM | POA: Diagnosis not present

## 2012-03-15 DIAGNOSIS — M5137 Other intervertebral disc degeneration, lumbosacral region: Secondary | ICD-10-CM | POA: Diagnosis not present

## 2012-03-15 DIAGNOSIS — S336XXA Sprain of sacroiliac joint, initial encounter: Secondary | ICD-10-CM | POA: Diagnosis not present

## 2012-03-15 DIAGNOSIS — IMO0002 Reserved for concepts with insufficient information to code with codable children: Secondary | ICD-10-CM | POA: Diagnosis not present

## 2012-03-15 DIAGNOSIS — M999 Biomechanical lesion, unspecified: Secondary | ICD-10-CM | POA: Diagnosis not present

## 2012-03-16 ENCOUNTER — Ambulatory Visit (AMBULATORY_SURGERY_CENTER): Payer: Medicare Other | Admitting: *Deleted

## 2012-03-16 VITALS — Ht 69.0 in | Wt 201.7 lb

## 2012-03-16 DIAGNOSIS — S336XXA Sprain of sacroiliac joint, initial encounter: Secondary | ICD-10-CM | POA: Diagnosis not present

## 2012-03-16 DIAGNOSIS — M5137 Other intervertebral disc degeneration, lumbosacral region: Secondary | ICD-10-CM | POA: Diagnosis not present

## 2012-03-16 DIAGNOSIS — IMO0002 Reserved for concepts with insufficient information to code with codable children: Secondary | ICD-10-CM | POA: Diagnosis not present

## 2012-03-16 DIAGNOSIS — Z1211 Encounter for screening for malignant neoplasm of colon: Secondary | ICD-10-CM

## 2012-03-16 DIAGNOSIS — M999 Biomechanical lesion, unspecified: Secondary | ICD-10-CM | POA: Diagnosis not present

## 2012-03-16 MED ORDER — MOVIPREP 100 G PO SOLR: ORAL | Status: DC

## 2012-03-18 DIAGNOSIS — S336XXA Sprain of sacroiliac joint, initial encounter: Secondary | ICD-10-CM | POA: Diagnosis not present

## 2012-03-18 DIAGNOSIS — M5137 Other intervertebral disc degeneration, lumbosacral region: Secondary | ICD-10-CM | POA: Diagnosis not present

## 2012-03-18 DIAGNOSIS — IMO0002 Reserved for concepts with insufficient information to code with codable children: Secondary | ICD-10-CM | POA: Diagnosis not present

## 2012-03-18 DIAGNOSIS — M999 Biomechanical lesion, unspecified: Secondary | ICD-10-CM | POA: Diagnosis not present

## 2012-03-21 DIAGNOSIS — M5137 Other intervertebral disc degeneration, lumbosacral region: Secondary | ICD-10-CM | POA: Diagnosis not present

## 2012-03-21 DIAGNOSIS — IMO0002 Reserved for concepts with insufficient information to code with codable children: Secondary | ICD-10-CM | POA: Diagnosis not present

## 2012-03-21 DIAGNOSIS — M999 Biomechanical lesion, unspecified: Secondary | ICD-10-CM | POA: Diagnosis not present

## 2012-03-21 DIAGNOSIS — S336XXA Sprain of sacroiliac joint, initial encounter: Secondary | ICD-10-CM | POA: Diagnosis not present

## 2012-03-29 DIAGNOSIS — M5137 Other intervertebral disc degeneration, lumbosacral region: Secondary | ICD-10-CM | POA: Diagnosis not present

## 2012-03-29 DIAGNOSIS — M999 Biomechanical lesion, unspecified: Secondary | ICD-10-CM | POA: Diagnosis not present

## 2012-03-29 DIAGNOSIS — IMO0002 Reserved for concepts with insufficient information to code with codable children: Secondary | ICD-10-CM | POA: Diagnosis not present

## 2012-03-29 DIAGNOSIS — S336XXA Sprain of sacroiliac joint, initial encounter: Secondary | ICD-10-CM | POA: Diagnosis not present

## 2012-03-30 ENCOUNTER — Encounter: Payer: Self-pay | Admitting: Gastroenterology

## 2012-03-30 ENCOUNTER — Ambulatory Visit (AMBULATORY_SURGERY_CENTER): Payer: Medicare Other | Admitting: Gastroenterology

## 2012-03-30 VITALS — BP 184/83 | HR 76 | Temp 98.2°F | Resp 17 | Ht 69.0 in | Wt 201.0 lb

## 2012-03-30 DIAGNOSIS — G8389 Other specified paralytic syndromes: Secondary | ICD-10-CM | POA: Diagnosis not present

## 2012-03-30 DIAGNOSIS — I10 Essential (primary) hypertension: Secondary | ICD-10-CM | POA: Diagnosis not present

## 2012-03-30 DIAGNOSIS — Z1211 Encounter for screening for malignant neoplasm of colon: Secondary | ICD-10-CM | POA: Diagnosis not present

## 2012-03-30 DIAGNOSIS — D126 Benign neoplasm of colon, unspecified: Secondary | ICD-10-CM

## 2012-03-30 DIAGNOSIS — E785 Hyperlipidemia, unspecified: Secondary | ICD-10-CM | POA: Diagnosis not present

## 2012-03-30 MED ORDER — SODIUM CHLORIDE 0.9 % IV SOLN: 500.0000 mL | INTRAVENOUS | Status: DC

## 2012-03-30 NOTE — Patient Instructions (Addendum)
YOU HAD AN ENDOSCOPIC PROCEDURE TODAY AT THE [facility]: Refer to the procedure report that was given to you for any specific questions about what was found during the examination.  If the procedure report does not answer your questions, please call your gastroenterologist to clarify.  If you requested that your care partner not be given the details of your procedure findings, then the procedure report has been included in a sealed envelope for you to review at your convenience later.  YOU SHOULD EXPECT: Some feelings of bloating in the abdomen. Passage of more gas than usual.  Walking can help get rid of the air that was put into your GI tract during the procedure and reduce the bloating. If you had a lower endoscopy (such as a colonoscopy or flexible sigmoidoscopy) you may notice spotting of blood in your stool or on the toilet paper. If you underwent a bowel prep for your procedure, then you may not have a normal bowel movement for a few days.  DIET: Your first meal following the procedure should be a light meal and then it is ok to progress to your normal diet.  A half-sandwich or bowl of soup is an example of a good first meal.  Heavy or fried foods are harder to digest and may make you feel nauseous or bloated.  Likewise meals heavy in dairy and vegetables can cause extra gas to form and this can also increase the bloating.  Drink plenty of fluids but you should avoid alcoholic beverages for 24 hours.  ACTIVITY: Your care partner should take you home directly after the procedure.  You should plan to take it easy, moving slowly for the rest of the day.  You can resume normal activity the day after the procedure however you should NOT DRIVE or use heavy machinery for 24 hours (because of the sedation medicines used during the test).    SYMPTOMS TO REPORT IMMEDIATELY: A gastroenterologist can be reached at any hour.  During normal business hours, 8:30 AM to 5:00 PM Monday through Friday,  call [phone].  After hours and on weekends, please call the GI answering service at [phone] who will take a message and have the physician on call contact you.   Following lower endoscopy (colonoscopy or flexible sigmoidoscopy):  Excessive amounts of blood in the stool  Significant tenderness or worsening of abdominal pains  Swelling of the abdomen that is new, acute  Fever of 100F or higher  Following upper endoscopy (EGD)  Vomiting of blood or coffee ground material  New chest pain or pain under the shoulder blades  Painful or persistently difficult swallowing  New shortness of breath  Fever of 100F or higher  Black, tarry-looking stools  FOLLOW UP: If any biopsies were taken you will be contacted by phone or by letter within the next 1-3 weeks.  Call your gastroenterologist if you have not heard about the biopsies in 3 weeks.  Our staff will call the home number listed on your records the next business day following your procedure to check on you and address any questions or concerns that you may have at that time regarding the information given to you following your procedure. This is a courtesy call and so if there is no answer at the home number and we have not heard from you through the emergency physician on call, we will assume that you have returned to your regular daily activities without incident.  SIGNATURES/CONFIDENTIALITY: You and/or your care   partner have signed paperwork which will be entered into your electronic medical record.  These signatures attest to the fact that that the information above on your After Visit Summary has been reviewed and is understood.  Full responsibility of the confidentiality of this discharge information lies with you and/or your care-partner.  

## 2012-03-30 NOTE — Op Note (Addendum)
[facility] [address] [place], [address]   COLONOSCOPY PROCEDURE REPORT  PATIENT: Antonio Clark, Antonio Clark  MR#: [id number] BIRTHDATE: 13-Jun-1936 , 76  yrs. old GENDER: Male ENDOSCOPIST: Mardella Layman, MD, [facility] REFERRED BY: PROCEDURE DATE:  03/30/2012 PROCEDURE:   Colonoscopy with snare polypectomy ASA CLASS:   Class III INDICATIONS:average risk patient for colon cancer. MEDICATIONS: Propofol (Diprivan) and Propofol (Diprivan) 230 mg IV   DESCRIPTION OF PROCEDURE:   After the risks and benefits and of the procedure were explained, informed consent was obtained.  A digital rectal exam revealed no abnormalities of the rectum.    The LB [id number]  endoscope was introduced through the anus and advanced to the cecum, which was identified by both the appendix and ileocecal valve .  The quality of the prep was excellent, using MoviPrep .  The instrument was then slowly withdrawn as the colon was fully examined.     COLON FINDINGS: A smooth flat polyp ranging between 3-62mm in size was found in the ascending colon.  A polypectomy was performed using snare cautery.  The resection was complete and the polyp tissue was completely retrieved.     Retroflexed views revealed no abnormalities.     The scope was then withdrawn from the patient and the procedure completed.  COMPLICATIONS: There were no complications. ENDOSCOPIC IMPRESSION: 1.   Flat polyp ranging between 3-66mm in size was found in the ascending colon; polypectomy was performed using snare cautery 2.    r/o adenoma  RECOMMENDATIONS: 1.  Await pathology results 2.  Repeat colonoscopy in 5 years if polyp adenomatous; otherwise 10 years   REPEAT EXAM:  NW:GNFAO Allyne Gee, MD  _______________________________ eSigned:  Mardella Layman, MD, [facility] 03/30/2012 9:00 AM

## 2012-03-30 NOTE — Progress Notes (Signed)
Patient did not experience any of the following events: a burn prior to discharge; a fall within the facility; wrong site/side/patient/procedure/implant event; or a hospital transfer or hospital admission upon discharge from the facility. (G8907) Patient did not have preoperative order for IV antibiotic SSI prophylaxis. (G8918)  

## 2012-03-31 ENCOUNTER — Telehealth: Payer: Self-pay | Admitting: *Deleted

## 2012-03-31 NOTE — Telephone Encounter (Signed)
  Follow up Call-  Call back number 03/30/2012  Post procedure Call Back phone  # [phone] or [phone]  Permission to leave phone message Yes     Patient questions:  Do you have a fever, pain , or abdominal swelling? no Pain Score  0 *  Have you tolerated food without any problems? yes  Have you been able to return to your normal activities? yes  Do you have any questions about your discharge instructions: Diet   no Medications  no Follow up visit  no  Do you have questions or concerns about your Care? no  Actions: * If pain score is 4 or above: No action needed, pain <4.

## 2012-04-05 ENCOUNTER — Encounter: Payer: Self-pay | Admitting: Gastroenterology

## 2012-04-18 ENCOUNTER — Other Ambulatory Visit: Payer: Self-pay | Admitting: Dermatology

## 2012-04-18 DIAGNOSIS — D485 Neoplasm of uncertain behavior of skin: Secondary | ICD-10-CM | POA: Diagnosis not present

## 2012-04-18 DIAGNOSIS — L820 Inflamed seborrheic keratosis: Secondary | ICD-10-CM | POA: Diagnosis not present

## 2012-04-25 DIAGNOSIS — S336XXA Sprain of sacroiliac joint, initial encounter: Secondary | ICD-10-CM | POA: Diagnosis not present

## 2012-04-25 DIAGNOSIS — M999 Biomechanical lesion, unspecified: Secondary | ICD-10-CM | POA: Diagnosis not present

## 2012-04-25 DIAGNOSIS — IMO0002 Reserved for concepts with insufficient information to code with codable children: Secondary | ICD-10-CM | POA: Diagnosis not present

## 2012-04-25 DIAGNOSIS — M5137 Other intervertebral disc degeneration, lumbosacral region: Secondary | ICD-10-CM | POA: Diagnosis not present

## 2012-05-23 DIAGNOSIS — M999 Biomechanical lesion, unspecified: Secondary | ICD-10-CM | POA: Diagnosis not present

## 2012-05-23 DIAGNOSIS — IMO0002 Reserved for concepts with insufficient information to code with codable children: Secondary | ICD-10-CM | POA: Diagnosis not present

## 2012-05-23 DIAGNOSIS — S336XXA Sprain of sacroiliac joint, initial encounter: Secondary | ICD-10-CM | POA: Diagnosis not present

## 2012-05-23 DIAGNOSIS — M5137 Other intervertebral disc degeneration, lumbosacral region: Secondary | ICD-10-CM | POA: Diagnosis not present

## 2012-06-07 IMAGING — CR DG CHEST 2V
2 series · 2 of 2 positions shown · non-contrast
Comparison: 02/28/2009

CLINICAL DATA: Vomiting

CHEST - 2 VIEW

[w chest lat]
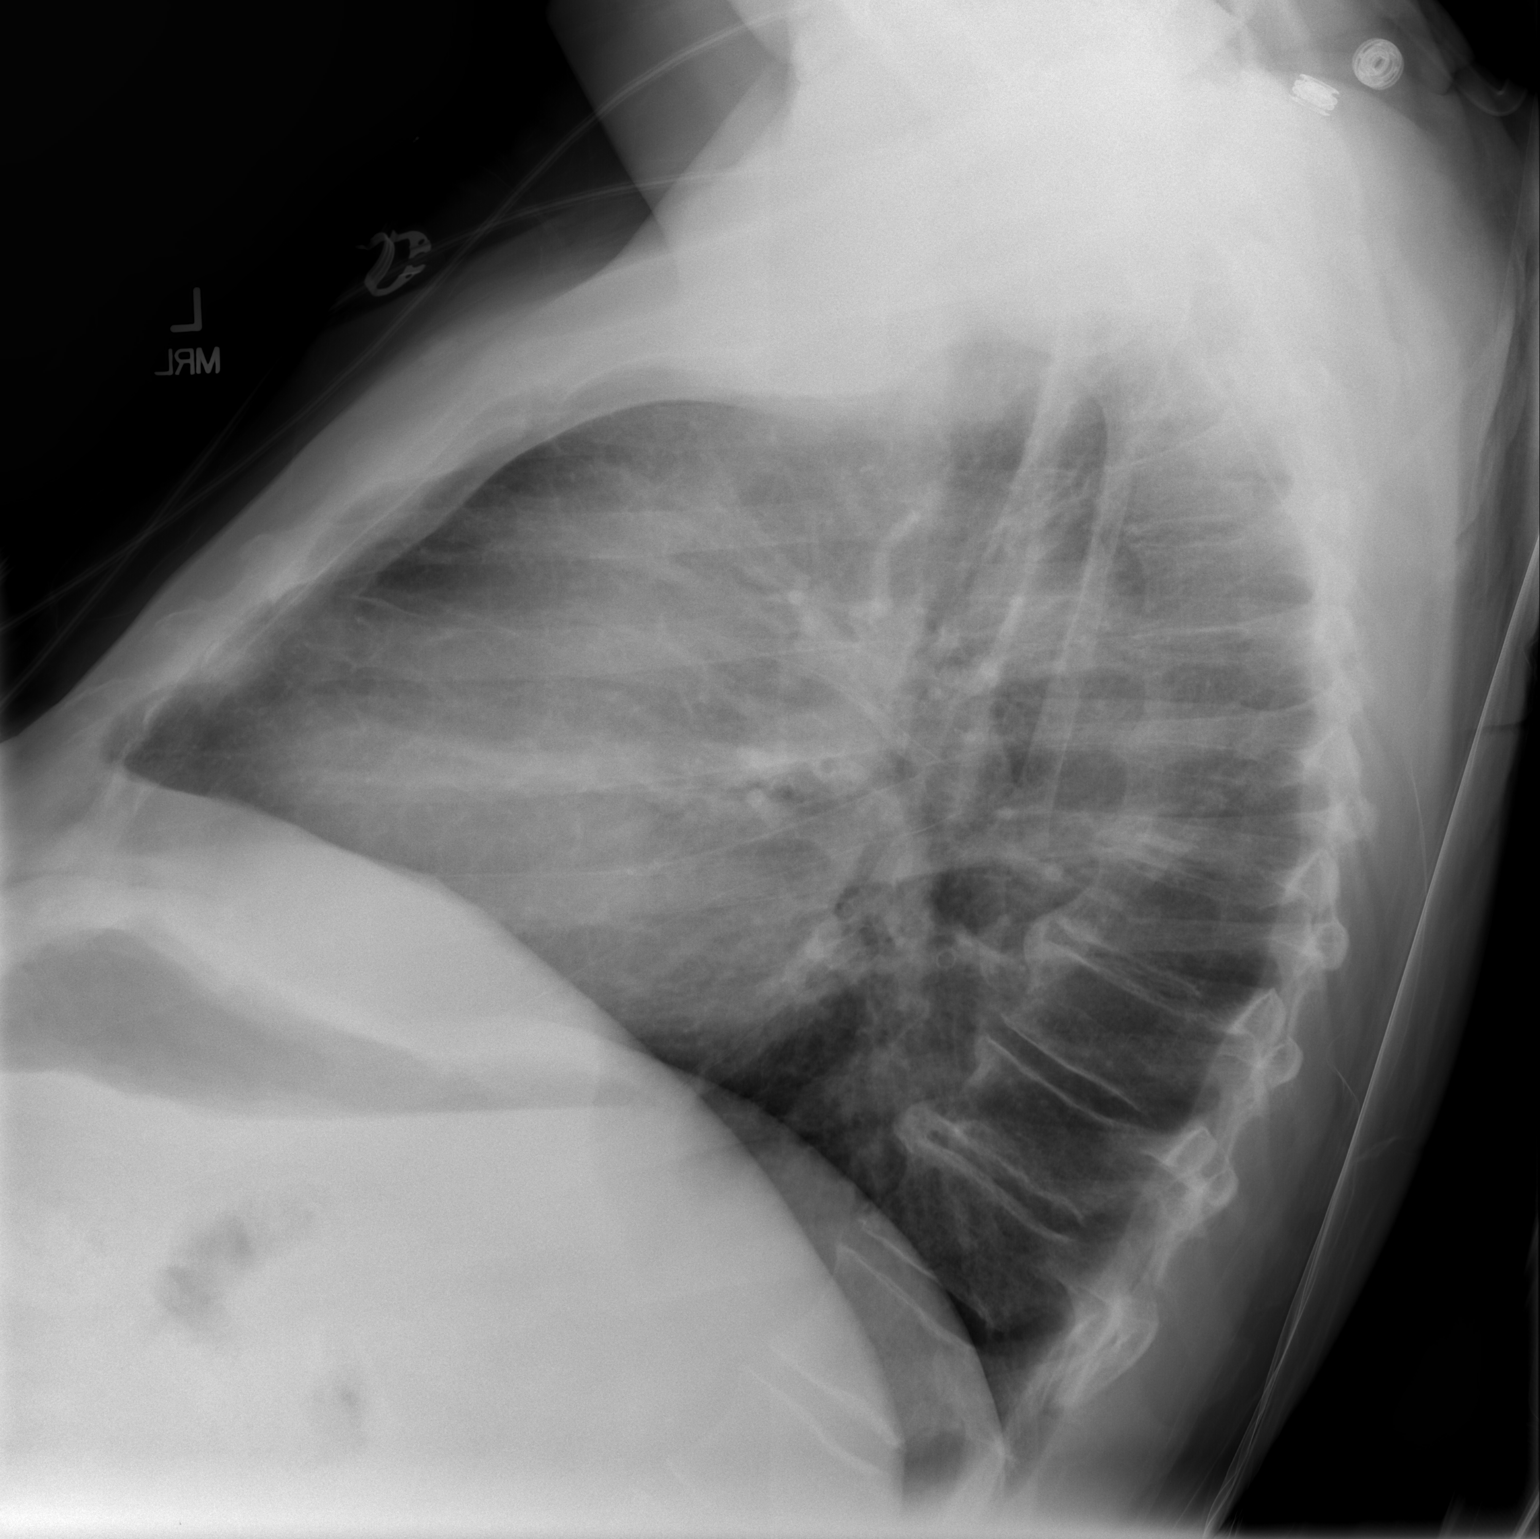

[view not recorded]
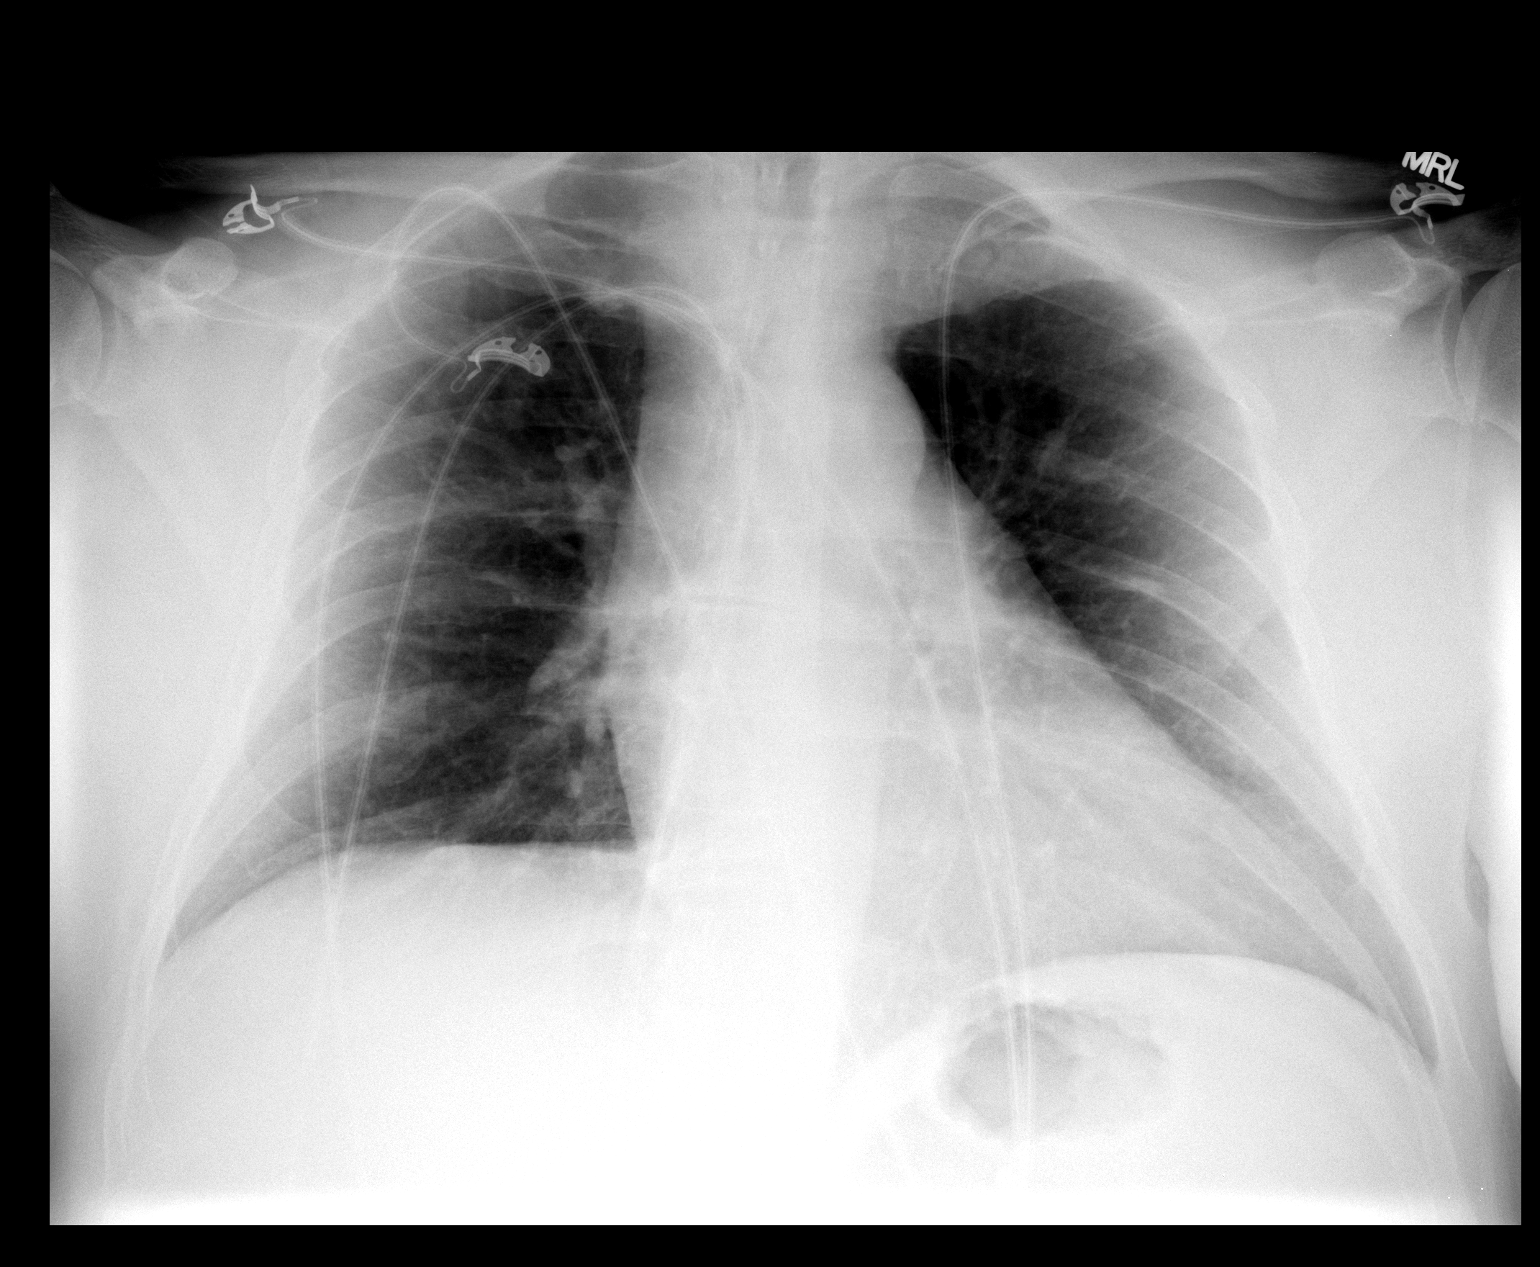

[2 of 2 positions shown; findings below may reference images not displayed]

FINDINGS: AP semi-erect and lateral views were obtained.  Heart and
lungs normal.  No pleural fluid or osseous lesions.
IMPRESSION: No active disease.

## 2012-06-14 ENCOUNTER — Other Ambulatory Visit: Payer: Self-pay | Admitting: Cardiology

## 2012-06-15 ENCOUNTER — Other Ambulatory Visit: Payer: Self-pay | Admitting: Cardiology

## 2012-06-15 DIAGNOSIS — J3489 Other specified disorders of nose and nasal sinuses: Secondary | ICD-10-CM | POA: Diagnosis not present

## 2012-06-15 DIAGNOSIS — Z79899 Other long term (current) drug therapy: Secondary | ICD-10-CM | POA: Diagnosis not present

## 2012-06-15 DIAGNOSIS — Z125 Encounter for screening for malignant neoplasm of prostate: Secondary | ICD-10-CM | POA: Diagnosis not present

## 2012-06-15 DIAGNOSIS — I10 Essential (primary) hypertension: Secondary | ICD-10-CM | POA: Diagnosis not present

## 2012-06-15 DIAGNOSIS — E785 Hyperlipidemia, unspecified: Secondary | ICD-10-CM | POA: Diagnosis not present

## 2012-06-15 DIAGNOSIS — K219 Gastro-esophageal reflux disease without esophagitis: Secondary | ICD-10-CM | POA: Diagnosis not present

## 2012-06-16 DIAGNOSIS — N35919 Unspecified urethral stricture, male, unspecified site: Secondary | ICD-10-CM | POA: Diagnosis not present

## 2012-06-16 DIAGNOSIS — C61 Malignant neoplasm of prostate: Secondary | ICD-10-CM | POA: Diagnosis not present

## 2012-06-16 DIAGNOSIS — N529 Male erectile dysfunction, unspecified: Secondary | ICD-10-CM | POA: Diagnosis not present

## 2012-06-16 DIAGNOSIS — N400 Enlarged prostate without lower urinary tract symptoms: Secondary | ICD-10-CM | POA: Diagnosis not present

## 2012-06-20 DIAGNOSIS — M5137 Other intervertebral disc degeneration, lumbosacral region: Secondary | ICD-10-CM | POA: Diagnosis not present

## 2012-06-20 DIAGNOSIS — S336XXA Sprain of sacroiliac joint, initial encounter: Secondary | ICD-10-CM | POA: Diagnosis not present

## 2012-06-20 DIAGNOSIS — M999 Biomechanical lesion, unspecified: Secondary | ICD-10-CM | POA: Diagnosis not present

## 2012-06-20 DIAGNOSIS — IMO0002 Reserved for concepts with insufficient information to code with codable children: Secondary | ICD-10-CM | POA: Diagnosis not present

## 2012-07-18 DIAGNOSIS — M999 Biomechanical lesion, unspecified: Secondary | ICD-10-CM | POA: Diagnosis not present

## 2012-07-18 DIAGNOSIS — IMO0002 Reserved for concepts with insufficient information to code with codable children: Secondary | ICD-10-CM | POA: Diagnosis not present

## 2012-07-18 DIAGNOSIS — S336XXA Sprain of sacroiliac joint, initial encounter: Secondary | ICD-10-CM | POA: Diagnosis not present

## 2012-07-18 DIAGNOSIS — M5137 Other intervertebral disc degeneration, lumbosacral region: Secondary | ICD-10-CM | POA: Diagnosis not present

## 2012-07-21 ENCOUNTER — Ambulatory Visit
Admission: RE | Admit: 2012-07-21 | Discharge: 2012-07-21 | Disposition: A | Discharge status: Home / Self Care | Payer: Medicare Other | Source: Ambulatory Visit | Attending: Adult Health | Admitting: Adult Health

## 2012-07-21 ENCOUNTER — Other Ambulatory Visit: Payer: Self-pay | Admitting: Adult Health

## 2012-07-21 DIAGNOSIS — R05 Cough: Secondary | ICD-10-CM | POA: Diagnosis not present

## 2012-07-21 DIAGNOSIS — R062 Wheezing: Secondary | ICD-10-CM | POA: Diagnosis not present

## 2012-07-25 DIAGNOSIS — N35919 Unspecified urethral stricture, male, unspecified site: Secondary | ICD-10-CM | POA: Diagnosis not present

## 2012-07-25 DIAGNOSIS — N529 Male erectile dysfunction, unspecified: Secondary | ICD-10-CM | POA: Insufficient documentation

## 2012-07-25 DIAGNOSIS — C61 Malignant neoplasm of prostate: Secondary | ICD-10-CM | POA: Diagnosis not present

## 2012-08-15 DIAGNOSIS — S336XXA Sprain of sacroiliac joint, initial encounter: Secondary | ICD-10-CM | POA: Diagnosis not present

## 2012-08-15 DIAGNOSIS — IMO0002 Reserved for concepts with insufficient information to code with codable children: Secondary | ICD-10-CM | POA: Diagnosis not present

## 2012-08-15 DIAGNOSIS — M5137 Other intervertebral disc degeneration, lumbosacral region: Secondary | ICD-10-CM | POA: Diagnosis not present

## 2012-08-15 DIAGNOSIS — M999 Biomechanical lesion, unspecified: Secondary | ICD-10-CM | POA: Diagnosis not present

## 2012-09-19 DIAGNOSIS — M999 Biomechanical lesion, unspecified: Secondary | ICD-10-CM | POA: Diagnosis not present

## 2012-09-19 DIAGNOSIS — IMO0002 Reserved for concepts with insufficient information to code with codable children: Secondary | ICD-10-CM | POA: Diagnosis not present

## 2012-09-19 DIAGNOSIS — M5137 Other intervertebral disc degeneration, lumbosacral region: Secondary | ICD-10-CM | POA: Diagnosis not present

## 2012-09-19 DIAGNOSIS — S336XXA Sprain of sacroiliac joint, initial encounter: Secondary | ICD-10-CM | POA: Diagnosis not present

## 2012-10-17 DIAGNOSIS — M999 Biomechanical lesion, unspecified: Secondary | ICD-10-CM | POA: Diagnosis not present

## 2012-10-17 DIAGNOSIS — S336XXA Sprain of sacroiliac joint, initial encounter: Secondary | ICD-10-CM | POA: Diagnosis not present

## 2012-10-17 DIAGNOSIS — IMO0002 Reserved for concepts with insufficient information to code with codable children: Secondary | ICD-10-CM | POA: Diagnosis not present

## 2012-10-17 DIAGNOSIS — M5137 Other intervertebral disc degeneration, lumbosacral region: Secondary | ICD-10-CM | POA: Diagnosis not present

## 2012-11-07 DIAGNOSIS — M999 Biomechanical lesion, unspecified: Secondary | ICD-10-CM | POA: Diagnosis not present

## 2012-11-07 DIAGNOSIS — S336XXA Sprain of sacroiliac joint, initial encounter: Secondary | ICD-10-CM | POA: Diagnosis not present

## 2012-11-07 DIAGNOSIS — IMO0002 Reserved for concepts with insufficient information to code with codable children: Secondary | ICD-10-CM | POA: Diagnosis not present

## 2012-11-07 DIAGNOSIS — M5137 Other intervertebral disc degeneration, lumbosacral region: Secondary | ICD-10-CM | POA: Diagnosis not present

## 2012-12-05 DIAGNOSIS — M5137 Other intervertebral disc degeneration, lumbosacral region: Secondary | ICD-10-CM | POA: Diagnosis not present

## 2012-12-05 DIAGNOSIS — M999 Biomechanical lesion, unspecified: Secondary | ICD-10-CM | POA: Diagnosis not present

## 2012-12-05 DIAGNOSIS — S336XXA Sprain of sacroiliac joint, initial encounter: Secondary | ICD-10-CM | POA: Diagnosis not present

## 2012-12-05 DIAGNOSIS — IMO0002 Reserved for concepts with insufficient information to code with codable children: Secondary | ICD-10-CM | POA: Diagnosis not present

## 2012-12-12 DIAGNOSIS — H52209 Unspecified astigmatism, unspecified eye: Secondary | ICD-10-CM | POA: Diagnosis not present

## 2012-12-12 DIAGNOSIS — H25019 Cortical age-related cataract, unspecified eye: Secondary | ICD-10-CM | POA: Diagnosis not present

## 2012-12-23 DIAGNOSIS — Z8546 Personal history of malignant neoplasm of prostate: Secondary | ICD-10-CM | POA: Diagnosis not present

## 2012-12-23 DIAGNOSIS — I10 Essential (primary) hypertension: Secondary | ICD-10-CM | POA: Diagnosis not present

## 2012-12-23 DIAGNOSIS — Z79899 Other long term (current) drug therapy: Secondary | ICD-10-CM | POA: Diagnosis not present

## 2012-12-23 DIAGNOSIS — E785 Hyperlipidemia, unspecified: Secondary | ICD-10-CM | POA: Diagnosis not present

## 2013-01-03 DIAGNOSIS — IMO0002 Reserved for concepts with insufficient information to code with codable children: Secondary | ICD-10-CM | POA: Diagnosis not present

## 2013-01-03 DIAGNOSIS — S336XXA Sprain of sacroiliac joint, initial encounter: Secondary | ICD-10-CM | POA: Diagnosis not present

## 2013-01-03 DIAGNOSIS — M5137 Other intervertebral disc degeneration, lumbosacral region: Secondary | ICD-10-CM | POA: Diagnosis not present

## 2013-01-03 DIAGNOSIS — M999 Biomechanical lesion, unspecified: Secondary | ICD-10-CM | POA: Diagnosis not present

## 2013-01-13 ENCOUNTER — Other Ambulatory Visit: Payer: Self-pay | Admitting: Cardiology

## 2013-01-16 NOTE — Telephone Encounter (Signed)
..   Requested Prescriptions   Pending Prescriptions Disp Refills  . pravastatin (PRAVACHOL) 40 MG tablet [Pharmacy Med Name: PRAVASTATIN SODIUM 40 MG TAB] 30 tablet 1    Sig: TAKE 1 TABLET (40 MG TOTAL) BY MOUTH EVERY EVENING.  Marland Kitchen.Patient needs to contact office to schedule  Appointment  for future refills.Ph:[phone]. Thank you.

## 2013-01-23 DIAGNOSIS — N529 Male erectile dysfunction, unspecified: Secondary | ICD-10-CM | POA: Diagnosis not present

## 2013-01-23 DIAGNOSIS — N35919 Unspecified urethral stricture, male, unspecified site: Secondary | ICD-10-CM | POA: Diagnosis not present

## 2013-01-23 DIAGNOSIS — C61 Malignant neoplasm of prostate: Secondary | ICD-10-CM | POA: Diagnosis not present

## 2013-02-06 DIAGNOSIS — M999 Biomechanical lesion, unspecified: Secondary | ICD-10-CM | POA: Diagnosis not present

## 2013-02-06 DIAGNOSIS — IMO0002 Reserved for concepts with insufficient information to code with codable children: Secondary | ICD-10-CM | POA: Diagnosis not present

## 2013-02-06 DIAGNOSIS — S336XXA Sprain of sacroiliac joint, initial encounter: Secondary | ICD-10-CM | POA: Diagnosis not present

## 2013-02-06 DIAGNOSIS — M5137 Other intervertebral disc degeneration, lumbosacral region: Secondary | ICD-10-CM | POA: Diagnosis not present

## 2013-03-06 DIAGNOSIS — M47814 Spondylosis without myelopathy or radiculopathy, thoracic region: Secondary | ICD-10-CM | POA: Diagnosis not present

## 2013-03-06 DIAGNOSIS — M999 Biomechanical lesion, unspecified: Secondary | ICD-10-CM | POA: Diagnosis not present

## 2013-03-06 DIAGNOSIS — IMO0002 Reserved for concepts with insufficient information to code with codable children: Secondary | ICD-10-CM | POA: Diagnosis not present

## 2013-03-09 DIAGNOSIS — C61 Malignant neoplasm of prostate: Secondary | ICD-10-CM | POA: Diagnosis not present

## 2013-03-09 DIAGNOSIS — E291 Testicular hypofunction: Secondary | ICD-10-CM | POA: Insufficient documentation

## 2013-03-09 DIAGNOSIS — N529 Male erectile dysfunction, unspecified: Secondary | ICD-10-CM | POA: Diagnosis not present

## 2013-03-09 DIAGNOSIS — N35919 Unspecified urethral stricture, male, unspecified site: Secondary | ICD-10-CM | POA: Diagnosis not present

## 2013-03-24 DIAGNOSIS — N35919 Unspecified urethral stricture, male, unspecified site: Secondary | ICD-10-CM | POA: Diagnosis not present

## 2013-03-24 DIAGNOSIS — E291 Testicular hypofunction: Secondary | ICD-10-CM | POA: Diagnosis not present

## 2013-03-24 DIAGNOSIS — N529 Male erectile dysfunction, unspecified: Secondary | ICD-10-CM | POA: Diagnosis not present

## 2013-03-24 DIAGNOSIS — C61 Malignant neoplasm of prostate: Secondary | ICD-10-CM | POA: Diagnosis not present

## 2013-04-17 DIAGNOSIS — M47814 Spondylosis without myelopathy or radiculopathy, thoracic region: Secondary | ICD-10-CM | POA: Diagnosis not present

## 2013-04-17 DIAGNOSIS — IMO0002 Reserved for concepts with insufficient information to code with codable children: Secondary | ICD-10-CM | POA: Diagnosis not present

## 2013-04-17 DIAGNOSIS — M999 Biomechanical lesion, unspecified: Secondary | ICD-10-CM | POA: Diagnosis not present

## 2013-05-15 DIAGNOSIS — IMO0002 Reserved for concepts with insufficient information to code with codable children: Secondary | ICD-10-CM | POA: Diagnosis not present

## 2013-05-15 DIAGNOSIS — M47814 Spondylosis without myelopathy or radiculopathy, thoracic region: Secondary | ICD-10-CM | POA: Diagnosis not present

## 2013-05-15 DIAGNOSIS — M999 Biomechanical lesion, unspecified: Secondary | ICD-10-CM | POA: Diagnosis not present

## 2013-06-07 DIAGNOSIS — IMO0002 Reserved for concepts with insufficient information to code with codable children: Secondary | ICD-10-CM | POA: Diagnosis not present

## 2013-06-07 DIAGNOSIS — M999 Biomechanical lesion, unspecified: Secondary | ICD-10-CM | POA: Diagnosis not present

## 2013-06-07 DIAGNOSIS — M47814 Spondylosis without myelopathy or radiculopathy, thoracic region: Secondary | ICD-10-CM | POA: Diagnosis not present

## 2013-06-12 DIAGNOSIS — M47814 Spondylosis without myelopathy or radiculopathy, thoracic region: Secondary | ICD-10-CM | POA: Diagnosis not present

## 2013-06-12 DIAGNOSIS — M999 Biomechanical lesion, unspecified: Secondary | ICD-10-CM | POA: Diagnosis not present

## 2013-06-12 DIAGNOSIS — IMO0002 Reserved for concepts with insufficient information to code with codable children: Secondary | ICD-10-CM | POA: Diagnosis not present

## 2013-06-14 DIAGNOSIS — IMO0002 Reserved for concepts with insufficient information to code with codable children: Secondary | ICD-10-CM | POA: Diagnosis not present

## 2013-06-14 DIAGNOSIS — M47814 Spondylosis without myelopathy or radiculopathy, thoracic region: Secondary | ICD-10-CM | POA: Diagnosis not present

## 2013-06-14 DIAGNOSIS — M999 Biomechanical lesion, unspecified: Secondary | ICD-10-CM | POA: Diagnosis not present

## 2013-06-16 DIAGNOSIS — IMO0002 Reserved for concepts with insufficient information to code with codable children: Secondary | ICD-10-CM | POA: Diagnosis not present

## 2013-06-16 DIAGNOSIS — M47814 Spondylosis without myelopathy or radiculopathy, thoracic region: Secondary | ICD-10-CM | POA: Diagnosis not present

## 2013-06-16 DIAGNOSIS — M999 Biomechanical lesion, unspecified: Secondary | ICD-10-CM | POA: Diagnosis not present

## 2013-06-21 DIAGNOSIS — IMO0002 Reserved for concepts with insufficient information to code with codable children: Secondary | ICD-10-CM | POA: Diagnosis not present

## 2013-06-21 DIAGNOSIS — M999 Biomechanical lesion, unspecified: Secondary | ICD-10-CM | POA: Diagnosis not present

## 2013-06-21 DIAGNOSIS — M47814 Spondylosis without myelopathy or radiculopathy, thoracic region: Secondary | ICD-10-CM | POA: Diagnosis not present

## 2013-07-24 DIAGNOSIS — E291 Testicular hypofunction: Secondary | ICD-10-CM | POA: Diagnosis not present

## 2013-07-24 DIAGNOSIS — N529 Male erectile dysfunction, unspecified: Secondary | ICD-10-CM | POA: Diagnosis not present

## 2013-07-24 DIAGNOSIS — N35919 Unspecified urethral stricture, male, unspecified site: Secondary | ICD-10-CM | POA: Diagnosis not present

## 2013-07-24 DIAGNOSIS — C61 Malignant neoplasm of prostate: Secondary | ICD-10-CM | POA: Diagnosis not present

## 2013-07-25 DIAGNOSIS — J45909 Unspecified asthma, uncomplicated: Secondary | ICD-10-CM | POA: Diagnosis not present

## 2013-07-26 ENCOUNTER — Other Ambulatory Visit: Payer: Self-pay | Admitting: Nurse Practitioner

## 2013-07-26 ENCOUNTER — Ambulatory Visit
Admission: RE | Admit: 2013-07-26 | Discharge: 2013-07-26 | Disposition: A | Discharge status: Home / Self Care | Payer: Medicare Other | Source: Ambulatory Visit | Attending: Nurse Practitioner | Admitting: Nurse Practitioner

## 2013-07-26 DIAGNOSIS — J189 Pneumonia, unspecified organism: Secondary | ICD-10-CM | POA: Diagnosis not present

## 2013-07-26 DIAGNOSIS — R05 Cough: Secondary | ICD-10-CM

## 2013-08-21 ENCOUNTER — Encounter: Payer: Self-pay | Admitting: Cardiology

## 2013-08-21 ENCOUNTER — Ambulatory Visit (INDEPENDENT_AMBULATORY_CARE_PROVIDER_SITE_OTHER): Payer: Medicare Other | Admitting: Cardiology

## 2013-08-21 VITALS — BP 120/90 | HR 73 | Ht 69.0 in | Wt 201.1 lb

## 2013-08-21 DIAGNOSIS — I10 Essential (primary) hypertension: Secondary | ICD-10-CM

## 2013-08-21 DIAGNOSIS — E785 Hyperlipidemia, unspecified: Secondary | ICD-10-CM | POA: Diagnosis not present

## 2013-08-21 LAB — BASIC METABOLIC PANEL
BUN: 16 mg/dL (ref 6–23)
CALCIUM: 9 mg/dL (ref 8.4–10.5)
CO2: 26 mEq/L (ref 19–32)
Chloride: 108 mEq/L (ref 96–112)
Creatinine, Ser: 1 mg/dL (ref 0.4–1.5)
GFR: 78.21 mL/min (ref >60.00)
GLUCOSE: 87 mg/dL (ref 70–99)
POTASSIUM: 4.2 meq/L (ref 3.5–5.1)
SODIUM: 140 meq/L (ref 135–145)

## 2013-08-21 LAB — LIPID PANEL
CHOL/HDL RATIO: 3
Cholesterol: 169 mg/dL (ref 0–200)
HDL: 50.3 mg/dL (ref >39.00)
LDL Cholesterol: 90 mg/dL (ref 0–99)
Triglycerides: 143 mg/dL (ref 0.0–149.0)
VLDL: 28.6 mg/dL (ref 0.0–40.0)

## 2013-08-21 NOTE — Patient Instructions (Signed)
Your physician recommends that you have a  lipid profile /BMET today.   Your physician wants you to follow-up in: 1 year with Dr Aundra Dubin. Antonio Clark 2016). You will receive a reminder letter in the mail two months in advance. If you don't receive a letter, please call our office to schedule the follow-up appointment.

## 2013-08-22 NOTE — Progress Notes (Signed)
Patient ID: Antonio Clark, male   DOB: 23-Mar-1936, 78 y.o.   MRN: [id number] PCP: Dr. Baird Cancer  78 yo with history of HTN, hyperlipidemia, and strong family history of CAD presents for cardiology followup.  He is in good shape in general, walking 30 minutes on the treadmill on most days.  No chest pain.  No exertional dyspnea.  He has a strong family history of CAD, with 3 brothers having CABG in their late 21s to 17s and his father having sudden cardiac death.  He is tolerating pravastatin.  BP is under good control on current regimen.  He had an ETT-Cardiolite in 4/13 for atypical chest pain.  This was a normal study.   ECG: NSR, normal  Labs (5/13): LDL 61, HDL 45  PMH: 1. Prostate cancer: s/p seed implantation 8/10.  2. Hyperlipidemia: Myalgias with one of the statins, ? Crestor 3. HTN 4. Borderline diabetes mellitus 5. Stress test ? 2005 normal per patient's report.  ETT-Cardiolite (4/13) with EF 54%, no ischemia or infarction.   SH: Never smoked, lives in [place], retired from [employer], never married  FH: 3 brothers with CAD, all found in late 70s-70s.  Father with sudden cardiac death.  Mother with CVA.   ROS: All systems reviewed and negative except as per HPI.   Current Outpatient Prescriptions  Medication Sig Dispense Refill  . aspirin 81 MG tablet Take 81 mg by mouth 2 (two) times daily.       Marland Kitchen losartan-hydrochlorothiazide (HYZAAR) 100-12.5 MG per tablet Take 1 tablet by mouth daily.        . multivitamin (THERAGRAN) per tablet Take 1 tablet by mouth daily.        . Omega-3 Fatty Acids (FISH OIL) 1200 MG CAPS Take 1,200 mg by mouth daily.        . pravastatin (PRAVACHOL) 40 MG tablet TAKE 1 TABLET (40 MG TOTAL) BY MOUTH EVERY EVENING.  30 tablet  5  . Tamsulosin HCl (FLOMAX) 0.4 MG CAPS Take 0.4 mg by mouth daily.         No current facility-administered medications for this visit.    BP 120/90  Pulse 73  Ht 5\' 9"  (1.753 m)  Wt 91.227 kg (201 lb 1.9 oz)   BMI 29.69 kg/m2 General: NAD Neck: No JVD, no thyromegaly or thyroid nodule.  Lungs: Clear to auscultation bilaterally with normal respiratory effort. CV: Nondisplaced PMI.  Heart regular S1/S2, no S3/S4, no murmur.  No peripheral edema.  No carotid bruit.  Normal pedal pulses.  Abdomen: Soft, nontender, no hepatosplenomegaly, no distention.  Neurologic: Alert and oriented x 3.  Psych: Normal affect. Extremities: No clubbing or cyanosis.   Assessment/Plan: 1. CAD:  No known CAD but strong family history.  ETT-Cardiolite in 4/13 was normal.  No further chest pain.  Continue ASA 81 mg daily.  Normal ECG.  2. HTN: BP is controlled on current regimen.  3. Hyperlipidemia: Check lipids on pravastatin.  I would like to keep LDL at least < 100.  4. Pre-operative evaluation: Patient needs hemorrhoid surgery.  I think that this can be done without further workup.   Loralie Champagne 08/22/2013

## 2013-08-28 ENCOUNTER — Telehealth: Payer: Self-pay | Admitting: Cardiology

## 2013-08-28 NOTE — Telephone Encounter (Signed)
Spoke with patient about recent lab results 

## 2013-08-28 NOTE — Telephone Encounter (Signed)
New message ° ° ° ° °Returned a nurses call °

## 2013-08-29 DIAGNOSIS — M999 Biomechanical lesion, unspecified: Secondary | ICD-10-CM | POA: Diagnosis not present

## 2013-08-29 DIAGNOSIS — IMO0002 Reserved for concepts with insufficient information to code with codable children: Secondary | ICD-10-CM | POA: Diagnosis not present

## 2013-08-29 DIAGNOSIS — M47814 Spondylosis without myelopathy or radiculopathy, thoracic region: Secondary | ICD-10-CM | POA: Diagnosis not present

## 2013-09-14 ENCOUNTER — Other Ambulatory Visit: Payer: Self-pay | Admitting: Cardiology

## 2013-09-27 DIAGNOSIS — M47814 Spondylosis without myelopathy or radiculopathy, thoracic region: Secondary | ICD-10-CM | POA: Diagnosis not present

## 2013-09-27 DIAGNOSIS — IMO0002 Reserved for concepts with insufficient information to code with codable children: Secondary | ICD-10-CM | POA: Diagnosis not present

## 2013-09-27 DIAGNOSIS — M999 Biomechanical lesion, unspecified: Secondary | ICD-10-CM | POA: Diagnosis not present

## 2013-10-10 DIAGNOSIS — C61 Malignant neoplasm of prostate: Secondary | ICD-10-CM | POA: Diagnosis not present

## 2013-10-10 DIAGNOSIS — N529 Male erectile dysfunction, unspecified: Secondary | ICD-10-CM | POA: Diagnosis not present

## 2013-10-10 DIAGNOSIS — N35919 Unspecified urethral stricture, male, unspecified site: Secondary | ICD-10-CM | POA: Diagnosis not present

## 2013-10-24 DIAGNOSIS — IMO0002 Reserved for concepts with insufficient information to code with codable children: Secondary | ICD-10-CM | POA: Diagnosis not present

## 2013-10-24 DIAGNOSIS — M47814 Spondylosis without myelopathy or radiculopathy, thoracic region: Secondary | ICD-10-CM | POA: Diagnosis not present

## 2013-10-24 DIAGNOSIS — M999 Biomechanical lesion, unspecified: Secondary | ICD-10-CM | POA: Diagnosis not present

## 2013-10-26 DIAGNOSIS — I491 Atrial premature depolarization: Secondary | ICD-10-CM | POA: Diagnosis not present

## 2013-10-26 DIAGNOSIS — I10 Essential (primary) hypertension: Secondary | ICD-10-CM | POA: Diagnosis not present

## 2013-11-06 DIAGNOSIS — N529 Male erectile dysfunction, unspecified: Secondary | ICD-10-CM | POA: Diagnosis not present

## 2013-11-06 DIAGNOSIS — I10 Essential (primary) hypertension: Secondary | ICD-10-CM | POA: Diagnosis not present

## 2013-11-06 DIAGNOSIS — Z7982 Long term (current) use of aspirin: Secondary | ICD-10-CM | POA: Diagnosis not present

## 2013-11-07 DIAGNOSIS — I10 Essential (primary) hypertension: Secondary | ICD-10-CM | POA: Diagnosis not present

## 2013-11-07 DIAGNOSIS — N529 Male erectile dysfunction, unspecified: Secondary | ICD-10-CM | POA: Diagnosis not present

## 2013-11-07 DIAGNOSIS — Z7982 Long term (current) use of aspirin: Secondary | ICD-10-CM | POA: Diagnosis not present

## 2013-12-04 DIAGNOSIS — M47814 Spondylosis without myelopathy or radiculopathy, thoracic region: Secondary | ICD-10-CM | POA: Diagnosis not present

## 2013-12-04 DIAGNOSIS — IMO0002 Reserved for concepts with insufficient information to code with codable children: Secondary | ICD-10-CM | POA: Diagnosis not present

## 2013-12-04 DIAGNOSIS — M999 Biomechanical lesion, unspecified: Secondary | ICD-10-CM | POA: Diagnosis not present

## 2013-12-28 ENCOUNTER — Other Ambulatory Visit: Payer: Self-pay

## 2013-12-28 MED ORDER — LOSARTAN POTASSIUM-HCTZ 100-12.5 MG PO TABS: 1.0000 | ORAL_TABLET | Freq: Every day | ORAL | Status: DC

## 2013-12-29 ENCOUNTER — Other Ambulatory Visit: Payer: Self-pay | Admitting: *Deleted

## 2013-12-29 MED ORDER — LOSARTAN POTASSIUM-HCTZ 100-12.5 MG PO TABS: 1.0000 | ORAL_TABLET | Freq: Every day | ORAL | Status: DC

## 2013-12-29 NOTE — Telephone Encounter (Signed)
Requested Prescriptions   Signed Prescriptions Disp Refills  . losartan-hydrochlorothiazide (HYZAAR) 100-12.5 MG per tablet 30 tablet 3    Sig: Take 1 tablet by mouth daily.    Authorizing Provider: Larey Dresser    Ordering User: Britt Bottom

## 2014-01-09 DIAGNOSIS — IMO0002 Reserved for concepts with insufficient information to code with codable children: Secondary | ICD-10-CM | POA: Diagnosis not present

## 2014-01-09 DIAGNOSIS — M999 Biomechanical lesion, unspecified: Secondary | ICD-10-CM | POA: Diagnosis not present

## 2014-01-09 DIAGNOSIS — M47814 Spondylosis without myelopathy or radiculopathy, thoracic region: Secondary | ICD-10-CM | POA: Diagnosis not present

## 2014-01-29 DIAGNOSIS — N529 Male erectile dysfunction, unspecified: Secondary | ICD-10-CM | POA: Diagnosis not present

## 2014-01-29 DIAGNOSIS — IMO0002 Reserved for concepts with insufficient information to code with codable children: Secondary | ICD-10-CM | POA: Diagnosis not present

## 2014-01-29 DIAGNOSIS — C61 Malignant neoplasm of prostate: Secondary | ICD-10-CM | POA: Diagnosis not present

## 2014-02-05 DIAGNOSIS — M47814 Spondylosis without myelopathy or radiculopathy, thoracic region: Secondary | ICD-10-CM | POA: Diagnosis not present

## 2014-02-05 DIAGNOSIS — IMO0002 Reserved for concepts with insufficient information to code with codable children: Secondary | ICD-10-CM | POA: Diagnosis not present

## 2014-02-05 DIAGNOSIS — M999 Biomechanical lesion, unspecified: Secondary | ICD-10-CM | POA: Diagnosis not present

## 2014-03-05 DIAGNOSIS — IMO0002 Reserved for concepts with insufficient information to code with codable children: Secondary | ICD-10-CM | POA: Diagnosis not present

## 2014-03-05 DIAGNOSIS — M999 Biomechanical lesion, unspecified: Secondary | ICD-10-CM | POA: Diagnosis not present

## 2014-03-05 DIAGNOSIS — M47814 Spondylosis without myelopathy or radiculopathy, thoracic region: Secondary | ICD-10-CM | POA: Diagnosis not present

## 2014-03-13 ENCOUNTER — Other Ambulatory Visit: Payer: Self-pay | Admitting: Cardiology

## 2014-03-19 DIAGNOSIS — E785 Hyperlipidemia, unspecified: Secondary | ICD-10-CM | POA: Diagnosis not present

## 2014-03-19 DIAGNOSIS — N182 Chronic kidney disease, stage 2 (mild): Secondary | ICD-10-CM | POA: Diagnosis not present

## 2014-03-19 DIAGNOSIS — H6120 Impacted cerumen, unspecified ear: Secondary | ICD-10-CM | POA: Diagnosis not present

## 2014-03-19 DIAGNOSIS — R7309 Other abnormal glucose: Secondary | ICD-10-CM | POA: Diagnosis not present

## 2014-03-19 DIAGNOSIS — I129 Hypertensive chronic kidney disease with stage 1 through stage 4 chronic kidney disease, or unspecified chronic kidney disease: Secondary | ICD-10-CM | POA: Diagnosis not present

## 2014-03-20 DIAGNOSIS — H5200 Hypermetropia, unspecified eye: Secondary | ICD-10-CM | POA: Diagnosis not present

## 2014-03-20 DIAGNOSIS — H2510 Age-related nuclear cataract, unspecified eye: Secondary | ICD-10-CM | POA: Diagnosis not present

## 2014-03-27 DIAGNOSIS — N529 Male erectile dysfunction, unspecified: Secondary | ICD-10-CM | POA: Diagnosis not present

## 2014-03-27 DIAGNOSIS — N35919 Unspecified urethral stricture, male, unspecified site: Secondary | ICD-10-CM | POA: Diagnosis not present

## 2014-03-27 DIAGNOSIS — E291 Testicular hypofunction: Secondary | ICD-10-CM | POA: Diagnosis not present

## 2014-03-27 DIAGNOSIS — Z8546 Personal history of malignant neoplasm of prostate: Secondary | ICD-10-CM | POA: Diagnosis not present

## 2014-04-02 DIAGNOSIS — M47814 Spondylosis without myelopathy or radiculopathy, thoracic region: Secondary | ICD-10-CM | POA: Diagnosis not present

## 2014-04-02 DIAGNOSIS — M999 Biomechanical lesion, unspecified: Secondary | ICD-10-CM | POA: Diagnosis not present

## 2014-04-02 DIAGNOSIS — IMO0002 Reserved for concepts with insufficient information to code with codable children: Secondary | ICD-10-CM | POA: Diagnosis not present

## 2014-04-04 DIAGNOSIS — IMO0002 Reserved for concepts with insufficient information to code with codable children: Secondary | ICD-10-CM | POA: Diagnosis not present

## 2014-04-04 DIAGNOSIS — M47814 Spondylosis without myelopathy or radiculopathy, thoracic region: Secondary | ICD-10-CM | POA: Diagnosis not present

## 2014-04-04 DIAGNOSIS — M999 Biomechanical lesion, unspecified: Secondary | ICD-10-CM | POA: Diagnosis not present

## 2014-04-09 DIAGNOSIS — M47814 Spondylosis without myelopathy or radiculopathy, thoracic region: Secondary | ICD-10-CM | POA: Diagnosis not present

## 2014-04-09 DIAGNOSIS — M999 Biomechanical lesion, unspecified: Secondary | ICD-10-CM | POA: Diagnosis not present

## 2014-04-09 DIAGNOSIS — IMO0002 Reserved for concepts with insufficient information to code with codable children: Secondary | ICD-10-CM | POA: Diagnosis not present

## 2014-04-10 ENCOUNTER — Other Ambulatory Visit: Payer: Self-pay | Admitting: Dermatology

## 2014-04-10 DIAGNOSIS — L565 Disseminated superficial actinic porokeratosis (DSAP): Secondary | ICD-10-CM | POA: Diagnosis not present

## 2014-04-10 DIAGNOSIS — D485 Neoplasm of uncertain behavior of skin: Secondary | ICD-10-CM | POA: Diagnosis not present

## 2014-04-10 DIAGNOSIS — L570 Actinic keratosis: Secondary | ICD-10-CM | POA: Diagnosis not present

## 2014-04-11 DIAGNOSIS — M999 Biomechanical lesion, unspecified: Secondary | ICD-10-CM | POA: Diagnosis not present

## 2014-04-11 DIAGNOSIS — IMO0002 Reserved for concepts with insufficient information to code with codable children: Secondary | ICD-10-CM | POA: Diagnosis not present

## 2014-04-11 DIAGNOSIS — M47814 Spondylosis without myelopathy or radiculopathy, thoracic region: Secondary | ICD-10-CM | POA: Diagnosis not present

## 2014-04-13 DIAGNOSIS — M999 Biomechanical lesion, unspecified: Secondary | ICD-10-CM | POA: Diagnosis not present

## 2014-04-13 DIAGNOSIS — IMO0002 Reserved for concepts with insufficient information to code with codable children: Secondary | ICD-10-CM | POA: Diagnosis not present

## 2014-04-13 DIAGNOSIS — M47814 Spondylosis without myelopathy or radiculopathy, thoracic region: Secondary | ICD-10-CM | POA: Diagnosis not present

## 2014-05-28 DIAGNOSIS — M9905 Segmental and somatic dysfunction of pelvic region: Secondary | ICD-10-CM | POA: Diagnosis not present

## 2014-05-28 DIAGNOSIS — M5136 Other intervertebral disc degeneration, lumbar region: Secondary | ICD-10-CM | POA: Diagnosis not present

## 2014-05-28 DIAGNOSIS — M9903 Segmental and somatic dysfunction of lumbar region: Secondary | ICD-10-CM | POA: Diagnosis not present

## 2014-05-28 DIAGNOSIS — M955 Acquired deformity of pelvis: Secondary | ICD-10-CM | POA: Diagnosis not present

## 2014-06-06 DIAGNOSIS — M5136 Other intervertebral disc degeneration, lumbar region: Secondary | ICD-10-CM | POA: Diagnosis not present

## 2014-06-06 DIAGNOSIS — M62830 Muscle spasm of back: Secondary | ICD-10-CM | POA: Diagnosis not present

## 2014-06-06 DIAGNOSIS — M9903 Segmental and somatic dysfunction of lumbar region: Secondary | ICD-10-CM | POA: Diagnosis not present

## 2014-06-06 DIAGNOSIS — M9902 Segmental and somatic dysfunction of thoracic region: Secondary | ICD-10-CM | POA: Diagnosis not present

## 2014-06-18 DIAGNOSIS — R7309 Other abnormal glucose: Secondary | ICD-10-CM | POA: Diagnosis not present

## 2014-06-18 DIAGNOSIS — I129 Hypertensive chronic kidney disease with stage 1 through stage 4 chronic kidney disease, or unspecified chronic kidney disease: Secondary | ICD-10-CM | POA: Diagnosis not present

## 2014-06-18 DIAGNOSIS — N182 Chronic kidney disease, stage 2 (mild): Secondary | ICD-10-CM | POA: Diagnosis not present

## 2014-06-20 ENCOUNTER — Other Ambulatory Visit: Payer: Self-pay | Admitting: Cardiology

## 2014-07-04 DIAGNOSIS — M62830 Muscle spasm of back: Secondary | ICD-10-CM | POA: Diagnosis not present

## 2014-07-04 DIAGNOSIS — M5136 Other intervertebral disc degeneration, lumbar region: Secondary | ICD-10-CM | POA: Diagnosis not present

## 2014-07-04 DIAGNOSIS — M9903 Segmental and somatic dysfunction of lumbar region: Secondary | ICD-10-CM | POA: Diagnosis not present

## 2014-07-04 DIAGNOSIS — M9902 Segmental and somatic dysfunction of thoracic region: Secondary | ICD-10-CM | POA: Diagnosis not present

## 2014-07-24 DIAGNOSIS — M5136 Other intervertebral disc degeneration, lumbar region: Secondary | ICD-10-CM | POA: Diagnosis not present

## 2014-07-24 DIAGNOSIS — M62830 Muscle spasm of back: Secondary | ICD-10-CM | POA: Diagnosis not present

## 2014-07-24 DIAGNOSIS — M9902 Segmental and somatic dysfunction of thoracic region: Secondary | ICD-10-CM | POA: Diagnosis not present

## 2014-07-24 DIAGNOSIS — M9903 Segmental and somatic dysfunction of lumbar region: Secondary | ICD-10-CM | POA: Diagnosis not present

## 2014-07-30 DIAGNOSIS — N529 Male erectile dysfunction, unspecified: Secondary | ICD-10-CM | POA: Diagnosis not present

## 2014-07-30 DIAGNOSIS — N35011 Post-traumatic bulbous urethral stricture: Secondary | ICD-10-CM | POA: Diagnosis not present

## 2014-07-30 DIAGNOSIS — N528 Other male erectile dysfunction: Secondary | ICD-10-CM | POA: Diagnosis not present

## 2014-07-30 DIAGNOSIS — E291 Testicular hypofunction: Secondary | ICD-10-CM | POA: Diagnosis not present

## 2014-07-30 DIAGNOSIS — I10 Essential (primary) hypertension: Secondary | ICD-10-CM | POA: Diagnosis not present

## 2014-07-30 DIAGNOSIS — N359 Urethral stricture, unspecified: Secondary | ICD-10-CM | POA: Diagnosis not present

## 2014-07-30 DIAGNOSIS — C61 Malignant neoplasm of prostate: Secondary | ICD-10-CM | POA: Diagnosis not present

## 2014-08-10 ENCOUNTER — Other Ambulatory Visit: Payer: Self-pay | Admitting: Cardiology

## 2014-08-13 DIAGNOSIS — J45901 Unspecified asthma with (acute) exacerbation: Secondary | ICD-10-CM | POA: Diagnosis not present

## 2014-08-13 DIAGNOSIS — R05 Cough: Secondary | ICD-10-CM | POA: Diagnosis not present

## 2014-08-13 DIAGNOSIS — J040 Acute laryngitis: Secondary | ICD-10-CM | POA: Diagnosis not present

## 2014-08-13 DIAGNOSIS — H6690 Otitis media, unspecified, unspecified ear: Secondary | ICD-10-CM | POA: Diagnosis not present

## 2014-08-22 DIAGNOSIS — M5136 Other intervertebral disc degeneration, lumbar region: Secondary | ICD-10-CM | POA: Diagnosis not present

## 2014-08-22 DIAGNOSIS — M62830 Muscle spasm of back: Secondary | ICD-10-CM | POA: Diagnosis not present

## 2014-08-22 DIAGNOSIS — M9902 Segmental and somatic dysfunction of thoracic region: Secondary | ICD-10-CM | POA: Diagnosis not present

## 2014-08-22 DIAGNOSIS — M9903 Segmental and somatic dysfunction of lumbar region: Secondary | ICD-10-CM | POA: Diagnosis not present

## 2014-09-08 ENCOUNTER — Other Ambulatory Visit: Payer: Self-pay | Admitting: Cardiology

## 2014-09-17 DIAGNOSIS — M9903 Segmental and somatic dysfunction of lumbar region: Secondary | ICD-10-CM | POA: Diagnosis not present

## 2014-09-17 DIAGNOSIS — M9902 Segmental and somatic dysfunction of thoracic region: Secondary | ICD-10-CM | POA: Diagnosis not present

## 2014-09-17 DIAGNOSIS — M5136 Other intervertebral disc degeneration, lumbar region: Secondary | ICD-10-CM | POA: Diagnosis not present

## 2014-09-17 DIAGNOSIS — M62830 Muscle spasm of back: Secondary | ICD-10-CM | POA: Diagnosis not present

## 2014-09-25 DIAGNOSIS — Z0000 Encounter for general adult medical examination without abnormal findings: Secondary | ICD-10-CM | POA: Diagnosis not present

## 2014-09-25 DIAGNOSIS — E559 Vitamin D deficiency, unspecified: Secondary | ICD-10-CM | POA: Diagnosis not present

## 2014-09-25 DIAGNOSIS — I129 Hypertensive chronic kidney disease with stage 1 through stage 4 chronic kidney disease, or unspecified chronic kidney disease: Secondary | ICD-10-CM | POA: Diagnosis not present

## 2014-09-25 DIAGNOSIS — N182 Chronic kidney disease, stage 2 (mild): Secondary | ICD-10-CM | POA: Diagnosis not present

## 2014-09-25 DIAGNOSIS — I120 Hypertensive chronic kidney disease with stage 5 chronic kidney disease or end stage renal disease: Secondary | ICD-10-CM | POA: Diagnosis not present

## 2014-09-25 DIAGNOSIS — Z1389 Encounter for screening for other disorder: Secondary | ICD-10-CM | POA: Diagnosis not present

## 2014-09-25 DIAGNOSIS — J45901 Unspecified asthma with (acute) exacerbation: Secondary | ICD-10-CM | POA: Diagnosis not present

## 2014-09-25 DIAGNOSIS — E780 Pure hypercholesterolemia: Secondary | ICD-10-CM | POA: Diagnosis not present

## 2014-09-25 DIAGNOSIS — R7309 Other abnormal glucose: Secondary | ICD-10-CM | POA: Diagnosis not present

## 2014-10-09 DIAGNOSIS — J45909 Unspecified asthma, uncomplicated: Secondary | ICD-10-CM | POA: Diagnosis not present

## 2014-10-09 DIAGNOSIS — Z23 Encounter for immunization: Secondary | ICD-10-CM | POA: Diagnosis not present

## 2014-10-10 ENCOUNTER — Other Ambulatory Visit: Payer: Self-pay | Admitting: Cardiology

## 2014-10-15 DIAGNOSIS — M62830 Muscle spasm of back: Secondary | ICD-10-CM | POA: Diagnosis not present

## 2014-10-15 DIAGNOSIS — M9902 Segmental and somatic dysfunction of thoracic region: Secondary | ICD-10-CM | POA: Diagnosis not present

## 2014-10-15 DIAGNOSIS — M5136 Other intervertebral disc degeneration, lumbar region: Secondary | ICD-10-CM | POA: Diagnosis not present

## 2014-10-15 DIAGNOSIS — M9903 Segmental and somatic dysfunction of lumbar region: Secondary | ICD-10-CM | POA: Diagnosis not present

## 2014-11-07 ENCOUNTER — Other Ambulatory Visit: Payer: Self-pay | Admitting: Cardiology

## 2014-11-12 DIAGNOSIS — M62830 Muscle spasm of back: Secondary | ICD-10-CM | POA: Diagnosis not present

## 2014-11-12 DIAGNOSIS — M9903 Segmental and somatic dysfunction of lumbar region: Secondary | ICD-10-CM | POA: Diagnosis not present

## 2014-11-12 DIAGNOSIS — M9902 Segmental and somatic dysfunction of thoracic region: Secondary | ICD-10-CM | POA: Diagnosis not present

## 2014-11-12 DIAGNOSIS — M5136 Other intervertebral disc degeneration, lumbar region: Secondary | ICD-10-CM | POA: Diagnosis not present

## 2014-11-14 DIAGNOSIS — M5136 Other intervertebral disc degeneration, lumbar region: Secondary | ICD-10-CM | POA: Diagnosis not present

## 2014-11-14 DIAGNOSIS — M9902 Segmental and somatic dysfunction of thoracic region: Secondary | ICD-10-CM | POA: Diagnosis not present

## 2014-11-14 DIAGNOSIS — M62830 Muscle spasm of back: Secondary | ICD-10-CM | POA: Diagnosis not present

## 2014-11-14 DIAGNOSIS — M9903 Segmental and somatic dysfunction of lumbar region: Secondary | ICD-10-CM | POA: Diagnosis not present

## 2014-11-19 DIAGNOSIS — M9903 Segmental and somatic dysfunction of lumbar region: Secondary | ICD-10-CM | POA: Diagnosis not present

## 2014-11-19 DIAGNOSIS — M9902 Segmental and somatic dysfunction of thoracic region: Secondary | ICD-10-CM | POA: Diagnosis not present

## 2014-11-19 DIAGNOSIS — M5136 Other intervertebral disc degeneration, lumbar region: Secondary | ICD-10-CM | POA: Diagnosis not present

## 2014-11-19 DIAGNOSIS — M62830 Muscle spasm of back: Secondary | ICD-10-CM | POA: Diagnosis not present

## 2014-11-26 DIAGNOSIS — M9903 Segmental and somatic dysfunction of lumbar region: Secondary | ICD-10-CM | POA: Diagnosis not present

## 2014-11-26 DIAGNOSIS — M9902 Segmental and somatic dysfunction of thoracic region: Secondary | ICD-10-CM | POA: Diagnosis not present

## 2014-11-26 DIAGNOSIS — M62830 Muscle spasm of back: Secondary | ICD-10-CM | POA: Diagnosis not present

## 2014-11-26 DIAGNOSIS — M5136 Other intervertebral disc degeneration, lumbar region: Secondary | ICD-10-CM | POA: Diagnosis not present

## 2014-12-03 DIAGNOSIS — M9903 Segmental and somatic dysfunction of lumbar region: Secondary | ICD-10-CM | POA: Diagnosis not present

## 2014-12-03 DIAGNOSIS — M9902 Segmental and somatic dysfunction of thoracic region: Secondary | ICD-10-CM | POA: Diagnosis not present

## 2014-12-03 DIAGNOSIS — M5136 Other intervertebral disc degeneration, lumbar region: Secondary | ICD-10-CM | POA: Diagnosis not present

## 2014-12-03 DIAGNOSIS — M62830 Muscle spasm of back: Secondary | ICD-10-CM | POA: Diagnosis not present

## 2014-12-05 DIAGNOSIS — M62830 Muscle spasm of back: Secondary | ICD-10-CM | POA: Diagnosis not present

## 2014-12-05 DIAGNOSIS — M9902 Segmental and somatic dysfunction of thoracic region: Secondary | ICD-10-CM | POA: Diagnosis not present

## 2014-12-05 DIAGNOSIS — M9903 Segmental and somatic dysfunction of lumbar region: Secondary | ICD-10-CM | POA: Diagnosis not present

## 2014-12-05 DIAGNOSIS — M5136 Other intervertebral disc degeneration, lumbar region: Secondary | ICD-10-CM | POA: Diagnosis not present

## 2014-12-10 DIAGNOSIS — M5136 Other intervertebral disc degeneration, lumbar region: Secondary | ICD-10-CM | POA: Diagnosis not present

## 2014-12-10 DIAGNOSIS — M62830 Muscle spasm of back: Secondary | ICD-10-CM | POA: Diagnosis not present

## 2014-12-10 DIAGNOSIS — M9903 Segmental and somatic dysfunction of lumbar region: Secondary | ICD-10-CM | POA: Diagnosis not present

## 2014-12-10 DIAGNOSIS — M9902 Segmental and somatic dysfunction of thoracic region: Secondary | ICD-10-CM | POA: Diagnosis not present

## 2014-12-11 ENCOUNTER — Other Ambulatory Visit: Payer: Self-pay | Admitting: Cardiology

## 2014-12-12 DIAGNOSIS — M9902 Segmental and somatic dysfunction of thoracic region: Secondary | ICD-10-CM | POA: Diagnosis not present

## 2014-12-12 DIAGNOSIS — M62830 Muscle spasm of back: Secondary | ICD-10-CM | POA: Diagnosis not present

## 2014-12-12 DIAGNOSIS — M5136 Other intervertebral disc degeneration, lumbar region: Secondary | ICD-10-CM | POA: Diagnosis not present

## 2014-12-12 DIAGNOSIS — M9903 Segmental and somatic dysfunction of lumbar region: Secondary | ICD-10-CM | POA: Diagnosis not present

## 2014-12-17 DIAGNOSIS — M62830 Muscle spasm of back: Secondary | ICD-10-CM | POA: Diagnosis not present

## 2014-12-17 DIAGNOSIS — M9903 Segmental and somatic dysfunction of lumbar region: Secondary | ICD-10-CM | POA: Diagnosis not present

## 2014-12-17 DIAGNOSIS — M9902 Segmental and somatic dysfunction of thoracic region: Secondary | ICD-10-CM | POA: Diagnosis not present

## 2014-12-17 DIAGNOSIS — M5136 Other intervertebral disc degeneration, lumbar region: Secondary | ICD-10-CM | POA: Diagnosis not present

## 2014-12-19 ENCOUNTER — Other Ambulatory Visit: Payer: Self-pay | Admitting: Cardiology

## 2014-12-24 DIAGNOSIS — M5136 Other intervertebral disc degeneration, lumbar region: Secondary | ICD-10-CM | POA: Diagnosis not present

## 2014-12-24 DIAGNOSIS — M9902 Segmental and somatic dysfunction of thoracic region: Secondary | ICD-10-CM | POA: Diagnosis not present

## 2014-12-24 DIAGNOSIS — M62830 Muscle spasm of back: Secondary | ICD-10-CM | POA: Diagnosis not present

## 2014-12-24 DIAGNOSIS — M9903 Segmental and somatic dysfunction of lumbar region: Secondary | ICD-10-CM | POA: Diagnosis not present

## 2014-12-31 DIAGNOSIS — M9903 Segmental and somatic dysfunction of lumbar region: Secondary | ICD-10-CM | POA: Diagnosis not present

## 2014-12-31 DIAGNOSIS — M5136 Other intervertebral disc degeneration, lumbar region: Secondary | ICD-10-CM | POA: Diagnosis not present

## 2014-12-31 DIAGNOSIS — M9902 Segmental and somatic dysfunction of thoracic region: Secondary | ICD-10-CM | POA: Diagnosis not present

## 2014-12-31 DIAGNOSIS — M62830 Muscle spasm of back: Secondary | ICD-10-CM | POA: Diagnosis not present

## 2015-01-09 ENCOUNTER — Other Ambulatory Visit: Payer: Self-pay | Admitting: Cardiology

## 2015-01-11 DIAGNOSIS — N528 Other male erectile dysfunction: Secondary | ICD-10-CM | POA: Diagnosis not present

## 2015-01-11 DIAGNOSIS — C61 Malignant neoplasm of prostate: Secondary | ICD-10-CM | POA: Diagnosis not present

## 2015-01-11 DIAGNOSIS — Z8546 Personal history of malignant neoplasm of prostate: Secondary | ICD-10-CM | POA: Diagnosis not present

## 2015-01-11 DIAGNOSIS — N35011 Post-traumatic bulbous urethral stricture: Secondary | ICD-10-CM | POA: Diagnosis not present

## 2015-03-10 ENCOUNTER — Other Ambulatory Visit: Payer: Self-pay | Admitting: Cardiology

## 2015-03-11 ENCOUNTER — Other Ambulatory Visit: Payer: Self-pay | Admitting: Dermatology

## 2015-03-11 ENCOUNTER — Other Ambulatory Visit: Payer: Medicare Other

## 2015-03-11 ENCOUNTER — Other Ambulatory Visit: Payer: Self-pay | Admitting: Internal Medicine

## 2015-03-11 ENCOUNTER — Ambulatory Visit
Admission: RE | Admit: 2015-03-11 | Discharge: 2015-03-11 | Disposition: A | Discharge status: Home / Self Care | Payer: Medicare Other | Source: Ambulatory Visit | Attending: Internal Medicine | Admitting: Internal Medicine

## 2015-03-11 DIAGNOSIS — C44329 Squamous cell carcinoma of skin of other parts of face: Secondary | ICD-10-CM | POA: Diagnosis not present

## 2015-03-11 DIAGNOSIS — R0781 Pleurodynia: Secondary | ICD-10-CM

## 2015-03-11 DIAGNOSIS — E780 Pure hypercholesterolemia: Secondary | ICD-10-CM | POA: Diagnosis not present

## 2015-03-11 DIAGNOSIS — C44320 Squamous cell carcinoma of skin of unspecified parts of face: Secondary | ICD-10-CM | POA: Diagnosis not present

## 2015-03-11 DIAGNOSIS — D485 Neoplasm of uncertain behavior of skin: Secondary | ICD-10-CM | POA: Diagnosis not present

## 2015-03-11 DIAGNOSIS — L570 Actinic keratosis: Secondary | ICD-10-CM | POA: Diagnosis not present

## 2015-03-11 DIAGNOSIS — C44629 Squamous cell carcinoma of skin of left upper limb, including shoulder: Secondary | ICD-10-CM | POA: Diagnosis not present

## 2015-03-11 DIAGNOSIS — N182 Chronic kidney disease, stage 2 (mild): Secondary | ICD-10-CM | POA: Diagnosis not present

## 2015-03-11 DIAGNOSIS — R7309 Other abnormal glucose: Secondary | ICD-10-CM | POA: Diagnosis not present

## 2015-03-11 DIAGNOSIS — I129 Hypertensive chronic kidney disease with stage 1 through stage 4 chronic kidney disease, or unspecified chronic kidney disease: Secondary | ICD-10-CM | POA: Diagnosis not present

## 2015-04-01 DIAGNOSIS — H25013 Cortical age-related cataract, bilateral: Secondary | ICD-10-CM | POA: Diagnosis not present

## 2015-04-01 DIAGNOSIS — H52203 Unspecified astigmatism, bilateral: Secondary | ICD-10-CM | POA: Diagnosis not present

## 2015-04-11 ENCOUNTER — Other Ambulatory Visit: Payer: Self-pay | Admitting: Cardiology

## 2015-04-11 ENCOUNTER — Other Ambulatory Visit: Payer: Self-pay | Admitting: Dermatology

## 2015-04-11 DIAGNOSIS — C44320 Squamous cell carcinoma of skin of unspecified parts of face: Secondary | ICD-10-CM | POA: Diagnosis not present

## 2015-04-11 DIAGNOSIS — L089 Local infection of the skin and subcutaneous tissue, unspecified: Secondary | ICD-10-CM | POA: Diagnosis not present

## 2015-04-19 DIAGNOSIS — Z4802 Encounter for removal of sutures: Secondary | ICD-10-CM | POA: Diagnosis not present

## 2015-04-24 DIAGNOSIS — M5136 Other intervertebral disc degeneration, lumbar region: Secondary | ICD-10-CM | POA: Diagnosis not present

## 2015-04-24 DIAGNOSIS — M9903 Segmental and somatic dysfunction of lumbar region: Secondary | ICD-10-CM | POA: Diagnosis not present

## 2015-04-24 DIAGNOSIS — M62830 Muscle spasm of back: Secondary | ICD-10-CM | POA: Diagnosis not present

## 2015-04-24 DIAGNOSIS — M9902 Segmental and somatic dysfunction of thoracic region: Secondary | ICD-10-CM | POA: Diagnosis not present

## 2015-04-25 DIAGNOSIS — E291 Testicular hypofunction: Secondary | ICD-10-CM | POA: Diagnosis not present

## 2015-04-25 DIAGNOSIS — Z8546 Personal history of malignant neoplasm of prostate: Secondary | ICD-10-CM | POA: Insufficient documentation

## 2015-04-25 DIAGNOSIS — Z9889 Other specified postprocedural states: Secondary | ICD-10-CM | POA: Diagnosis not present

## 2015-04-25 DIAGNOSIS — N529 Male erectile dysfunction, unspecified: Secondary | ICD-10-CM | POA: Diagnosis not present

## 2015-04-25 DIAGNOSIS — N35011 Post-traumatic bulbous urethral stricture: Secondary | ICD-10-CM | POA: Diagnosis not present

## 2015-04-25 DIAGNOSIS — R310 Gross hematuria: Secondary | ICD-10-CM | POA: Diagnosis not present

## 2015-04-25 DIAGNOSIS — N359 Urethral stricture, unspecified: Secondary | ICD-10-CM | POA: Diagnosis not present

## 2015-05-17 DIAGNOSIS — K8020 Calculus of gallbladder without cholecystitis without obstruction: Secondary | ICD-10-CM | POA: Diagnosis not present

## 2015-05-17 DIAGNOSIS — Z8546 Personal history of malignant neoplasm of prostate: Secondary | ICD-10-CM | POA: Diagnosis not present

## 2015-05-17 DIAGNOSIS — R828 Abnormal findings on cytological and histological examination of urine: Secondary | ICD-10-CM | POA: Diagnosis not present

## 2015-05-17 DIAGNOSIS — N359 Urethral stricture, unspecified: Secondary | ICD-10-CM | POA: Diagnosis not present

## 2015-05-17 DIAGNOSIS — R310 Gross hematuria: Secondary | ICD-10-CM | POA: Diagnosis not present

## 2015-05-17 DIAGNOSIS — E291 Testicular hypofunction: Secondary | ICD-10-CM | POA: Diagnosis not present

## 2015-05-17 DIAGNOSIS — K8689 Other specified diseases of pancreas: Secondary | ICD-10-CM | POA: Diagnosis not present

## 2015-05-17 DIAGNOSIS — N35011 Post-traumatic bulbous urethral stricture: Secondary | ICD-10-CM | POA: Diagnosis not present

## 2015-05-17 DIAGNOSIS — N281 Cyst of kidney, acquired: Secondary | ICD-10-CM | POA: Diagnosis not present

## 2015-05-17 DIAGNOSIS — N529 Male erectile dysfunction, unspecified: Secondary | ICD-10-CM | POA: Diagnosis not present

## 2015-05-28 DIAGNOSIS — Z23 Encounter for immunization: Secondary | ICD-10-CM | POA: Diagnosis not present

## 2015-06-06 DIAGNOSIS — C44629 Squamous cell carcinoma of skin of left upper limb, including shoulder: Secondary | ICD-10-CM | POA: Diagnosis not present

## 2015-06-21 ENCOUNTER — Other Ambulatory Visit: Payer: Self-pay | Admitting: Cardiology

## 2015-07-02 ENCOUNTER — Ambulatory Visit (INDEPENDENT_AMBULATORY_CARE_PROVIDER_SITE_OTHER): Payer: Medicare Other | Admitting: Cardiology

## 2015-07-02 ENCOUNTER — Encounter: Payer: Self-pay | Admitting: Cardiology

## 2015-07-02 ENCOUNTER — Encounter: Payer: Self-pay | Admitting: Family Medicine

## 2015-07-02 VITALS — BP 138/78 | HR 65 | Ht 69.0 in | Wt 196.8 lb

## 2015-07-02 DIAGNOSIS — E785 Hyperlipidemia, unspecified: Secondary | ICD-10-CM | POA: Diagnosis not present

## 2015-07-02 DIAGNOSIS — Z79899 Other long term (current) drug therapy: Secondary | ICD-10-CM | POA: Diagnosis not present

## 2015-07-02 DIAGNOSIS — I10 Essential (primary) hypertension: Secondary | ICD-10-CM

## 2015-07-02 LAB — LIPID PANEL
CHOL/HDL RATIO: 3.7 ratio (ref <=5.0)
Cholesterol: 154 mg/dL (ref 125–200)
HDL: 42 mg/dL (ref >=40)
LDL Cholesterol: 78 mg/dL (ref <130)
Triglycerides: 172 mg/dL — ABNORMAL HIGH (ref <150)
VLDL: 34 mg/dL — ABNORMAL HIGH (ref <30)

## 2015-07-02 LAB — CBC
HCT: 41.8 % (ref 39.0–52.0)
Hemoglobin: 14.2 g/dL (ref 13.0–17.0)
MCH: 30.1 pg (ref 26.0–34.0)
MCHC: 34 g/dL (ref 30.0–36.0)
MCV: 88.7 fL (ref 78.0–100.0)
MPV: 9.4 fL (ref 8.6–12.4)
PLATELETS: 148 10*3/uL — AB (ref 150–400)
RBC: 4.71 MIL/uL (ref 4.22–5.81)
RDW: 13.7 % (ref 11.5–15.5)
WBC: 5.9 10*3/uL (ref 4.0–10.5)

## 2015-07-02 LAB — BASIC METABOLIC PANEL
BUN: 15 mg/dL (ref 7–25)
CHLORIDE: 103 mmol/L (ref 98–110)
CO2: 27 mmol/L (ref 20–31)
Calcium: 9.3 mg/dL (ref 8.6–10.3)
Creat: 0.85 mg/dL (ref 0.70–1.18)
Glucose, Bld: 88 mg/dL (ref 65–99)
POTASSIUM: 4.2 mmol/L (ref 3.5–5.3)
SODIUM: 140 mmol/L (ref 135–146)

## 2015-07-02 NOTE — Patient Instructions (Addendum)
Medication Instructions:  The current medical regimen is effective;  continue present plan and medications.  Labwork: Please have blood work today (BMP/Lipid and CBC)  Follow-Up: Follow up in 1 year with Dr. Aundra Dubin.  You will receive a letter in the mail 2 months before you are due.  Please call us when you receive this letter to schedule your follow up appointment.   If you need a refill on your cardiac medications before your next appointment, please call your pharmacy.  Thank you for choosing [facility]!!

## 2015-07-02 NOTE — Progress Notes (Signed)
Patient ID: Antonio Clark, male   DOB: 11/16/1935, 79 y.o.   MRN: [id number] PCP: Dr. Dennard Schaumann  79 yo with history of HTN, hyperlipidemia, and strong family history of CAD presents for cardiology followup.  He is in good shape in general, walks 2-3 miles at the [facility] on most days.  No chest pain.  No exertional dyspnea.  He has a strong family history of CAD, with 3 brothers having CABG in their late 48s to 35s and his father having sudden cardiac death.  He is tolerating pravastatin.  BP is under good control on current regimen.  He had an ETT-Cardiolite in 4/13 for atypical chest pain.  This was a normal study.   ECG: NSR, PVC  Labs (5/13): LDL 61, HDL 45 Labs (1/15): LDL 90, HDL 50  PMH: 1. Prostate cancer: s/p seed implantation 8/10.  2. Hyperlipidemia: Myalgias with one of the statins, ? Crestor 3. HTN 4. Borderline diabetes mellitus 5. Stress test ? 2005 normal per patient's report.  ETT-Cardiolite (4/13) with EF 54%, no ischemia or infarction.   SH: Never smoked, lives in [place], retired from [employer], never married  FH: 3 brothers with CAD, all found in late 57s-70s.  Father with sudden cardiac death.  Mother with CVA.   ROS: All systems reviewed and negative except as per HPI.   Current Outpatient Prescriptions  Medication Sig Dispense Refill  . aspirin 81 MG tablet Take 81 mg by mouth 2 (two) times daily.     Marland Kitchen losartan-hydrochlorothiazide (HYZAAR) 100-12.5 MG tablet TAKE 1 TABLET BY MOUTH DAILY. 30 tablet 0  . multivitamin (THERAGRAN) per tablet Take 1 tablet by mouth daily.      . Omega-3 Fatty Acids (FISH OIL) 1200 MG CAPS Take 1,200 mg by mouth daily.      . pravastatin (PRAVACHOL) 40 MG tablet TAKE 1 TABLET (40 MG TOTAL) BY MOUTH EVERY EVENING. 30 tablet 6  . Tamsulosin HCl (FLOMAX) 0.4 MG CAPS Take 0.4 mg by mouth daily.       No current facility-administered medications for this visit.    BP 138/78 mmHg  Pulse 65  Ht 5\' 9"  (1.753 m)  Wt 196 lb 12.8  oz (89.268 kg)  BMI 29.05 kg/m2 General: NAD Neck: No JVD, no thyromegaly or thyroid nodule.  Lungs: Clear to auscultation bilaterally with normal respiratory effort. CV: Nondisplaced PMI.  Heart regular S1/S2, no S3/S4, no murmur.  No peripheral edema.  No carotid bruit.  Normal pedal pulses.  Abdomen: Soft, nontender, no hepatosplenomegaly, no distention.  Neurologic: Alert and oriented x 3.  Psych: Normal affect. Extremities: No clubbing or cyanosis.   Assessment/Plan: 1. CAD:  No known CAD but strong family history.  ETT-Cardiolite in 4/13 was normal.  No further chest pain.  Continue ASA 81 mg daily. 2. HTN: BP is controlled on current regimen. Check BMET on losartan/HCTZ. 3. Hyperlipidemia: Check lipids on pravastatin.  I would like to keep LDL at least < 100.   Loralie Champagne 07/02/2015

## 2015-07-08 ENCOUNTER — Encounter: Payer: Self-pay | Admitting: Family Medicine

## 2015-07-08 ENCOUNTER — Ambulatory Visit (INDEPENDENT_AMBULATORY_CARE_PROVIDER_SITE_OTHER): Payer: Medicare Other | Admitting: Family Medicine

## 2015-07-08 VITALS — BP 126/74 | HR 78 | Temp 98.5°F | Resp 16 | Ht 69.0 in | Wt 201.0 lb

## 2015-07-08 DIAGNOSIS — I10 Essential (primary) hypertension: Secondary | ICD-10-CM

## 2015-07-08 DIAGNOSIS — R7303 Prediabetes: Secondary | ICD-10-CM

## 2015-07-08 DIAGNOSIS — Z23 Encounter for immunization: Secondary | ICD-10-CM | POA: Diagnosis not present

## 2015-07-08 DIAGNOSIS — Z7189 Other specified counseling: Secondary | ICD-10-CM | POA: Diagnosis not present

## 2015-07-08 DIAGNOSIS — Z125 Encounter for screening for malignant neoplasm of prostate: Secondary | ICD-10-CM

## 2015-07-08 DIAGNOSIS — E785 Hyperlipidemia, unspecified: Secondary | ICD-10-CM

## 2015-07-08 DIAGNOSIS — Z7689 Persons encountering health services in other specified circumstances: Secondary | ICD-10-CM

## 2015-07-08 DIAGNOSIS — R7309 Other abnormal glucose: Secondary | ICD-10-CM | POA: Diagnosis not present

## 2015-07-08 DIAGNOSIS — Z0000 Encounter for general adult medical examination without abnormal findings: Secondary | ICD-10-CM | POA: Diagnosis not present

## 2015-07-08 LAB — HEMOGLOBIN A1C
HEMOGLOBIN A1C: 6.2 % — AB (ref <5.7)
Mean Plasma Glucose: 131 mg/dL — ABNORMAL HIGH (ref <117)

## 2015-07-08 NOTE — Addendum Note (Signed)
Addended by: Shary Decamp B on: 07/08/2015 02:43 PM   Modules accepted: Orders

## 2015-07-08 NOTE — Progress Notes (Signed)
Subjective:    Patient ID: Antonio Clark, male    DOB: [birth year], 79 y.o.   MRN: [id number]  HPI Patient is a very pleasant 79 year old [place] male who is here today to establish care. He has no medical concerns today. Past medical history is significant for prostate cancer. Dr. Rosana Hoes implanted radioactive seeds. He is currently followed by urology. His last PSA earlier this year was 0.01. He has a history of  Tubular adenomas. Last colonoscopy was in 2013 and is not due again until 2018. Patient states that he has had Pneumovax 23, the shingles vaccine, and his annual flu vaccine. He is due however for Prevnar 13. There is also a reported history of prediabetes. I have reviewed his recent lab work at his cardiologist office. His fasting blood sugar was excellent at 88. The remainder of his metabolic panel, CBC, and his fasting lipid panel were normal except for mild thrombocytopenia with platelet count 148. Past Medical History  Diagnosis Date  . Prostate CA ([place])   . Hyperlipidemia   . Borderline diabetic   . History of echocardiogram 07/ 07/ 2011  . Hypertension   . History of lithotripsy 1989   Past Surgical History  Procedure Laterality Date  . Insertion prostate radiation seed  8 4 2010    per Dr Rosana Hoes  . Throat surgery  1980    benign cyst  . Colonoscopy  2002  . Polypectomy  2002   Current Outpatient Prescriptions on File Prior to Visit  Medication Sig Dispense Refill  . aspirin 81 MG tablet Take 81 mg by mouth 2 (two) times daily.     Marland Kitchen losartan-hydrochlorothiazide (HYZAAR) 100-12.5 MG tablet TAKE 1 TABLET BY MOUTH DAILY. 30 tablet 0  . multivitamin (THERAGRAN) per tablet Take 1 tablet by mouth daily.      . Omega-3 Fatty Acids (FISH OIL) 1200 MG CAPS Take 1,200 mg by mouth daily.      . pravastatin (PRAVACHOL) 40 MG tablet TAKE 1 TABLET (40 MG TOTAL) BY MOUTH EVERY EVENING. 30 tablet 6  . Tamsulosin HCl (FLOMAX) 0.4 MG CAPS Take 0.4 mg by mouth daily.       No current  facility-administered medications on file prior to visit.   No Known Allergies Social History   Social History  . Marital Status: Single    Spouse Name: N/A  . Number of Children: N/A  . Years of Education: N/A   Occupational History  . retired    Social History Main Topics  . Smoking status: Never Smoker   . Smokeless tobacco: Never Used  . Alcohol Use: No  . Drug Use: No  . Sexual Activity: Yes   Other Topics Concern  . Not on file   Social History Narrative   Family History  Problem Relation Age of Onset  . Coronary artery disease Brother     3 brothers had CABG  . Arrhythmia Brother   . Diabetes Brother   . Hearing loss Brother   . Hypertension Brother   . Heart disease Brother   . Heart failure Brother   . Hypertension Brother   . Heart disease Father   . Hypertension Father   . Sudden death Father   . Stroke Mother     cerebral hemorrage  . Colon cancer Mother   . Cancer Mother   . Suicidality Sister   . Stroke Sister   . Diabetes Brother   . Hypertension Brother   . Hyperlipidemia Sister  Review of Systems  All other systems reviewed and are negative.      Objective:   Physical Exam  Constitutional: He is oriented to person, place, and time. He appears well-developed and well-nourished. No distress.  HENT:  Head: Normocephalic and atraumatic.  Right Ear: External ear normal.  Left Ear: External ear normal.  Nose: Nose normal.  Mouth/Throat: Oropharynx is clear and moist. No oropharyngeal exudate.  Eyes: Conjunctivae and EOM are normal. Pupils are equal, round, and reactive to light. Right eye exhibits no discharge. Left eye exhibits no discharge. No scleral icterus.  Neck: Normal range of motion. Neck supple. No JVD present. No tracheal deviation present. No thyromegaly present.  Cardiovascular: Normal rate, regular rhythm, normal heart sounds and intact distal pulses.  Exam reveals no gallop and no friction rub.   No murmur  heard. Pulmonary/Chest: Effort normal and breath sounds normal. No stridor. No respiratory distress. He has no wheezes. He has no rales. He exhibits no tenderness.  Abdominal: Soft. Bowel sounds are normal. He exhibits no distension and no mass. There is no tenderness. There is no rebound and no guarding.  Musculoskeletal: Normal range of motion. He exhibits no edema or tenderness.  Lymphadenopathy:    He has no cervical adenopathy.  Neurological: He is alert and oriented to person, place, and time. He has normal reflexes. He displays normal reflexes. No cranial nerve deficit. He exhibits normal muscle tone. Coordination normal.  Skin: Skin is warm. No rash noted. He is not diaphoretic. No erythema. No pallor.  Psychiatric: He has a normal mood and affect. His behavior is normal. Judgment and thought content normal.  Vitals reviewed. patient does have small actinic keratoses on his forehead, he sees Dr. [place] annually.        Assessment & Plan:  Establishing care with new doctor, encounter for  Routine general medical examination at a health care facility  Prostate cancer screening  Benign essential HTN  HLD (hyperlipidemia)  Prediabetes - Plan: Hemoglobin A1c  Cancer screening is up-to-date. His prostate exam/PSA is performed at urology annually. His colonoscopy is not due again until 2018. His immunizations are up-to-date except for Prevnar 13. He will receive that today in office. His recent fasting lab work is excellent. Given his reported history of prediabetes however, I would like to check a hemoglobin A1c. Blood pressure is excellent. Patient has his eyes checked annually. Exam today is significant for mild cataracts they're otherwise asymptomatic. Regular anticipatory guidance is provided. I will await the results of his hemoglobin A1c

## 2015-07-10 ENCOUNTER — Encounter: Payer: Self-pay | Admitting: Family Medicine

## 2015-07-15 DIAGNOSIS — Z8546 Personal history of malignant neoplasm of prostate: Secondary | ICD-10-CM | POA: Diagnosis not present

## 2015-07-15 DIAGNOSIS — N200 Calculus of kidney: Secondary | ICD-10-CM | POA: Diagnosis not present

## 2015-07-15 DIAGNOSIS — C61 Malignant neoplasm of prostate: Secondary | ICD-10-CM | POA: Diagnosis not present

## 2015-07-15 DIAGNOSIS — N529 Male erectile dysfunction, unspecified: Secondary | ICD-10-CM | POA: Diagnosis not present

## 2015-07-23 ENCOUNTER — Other Ambulatory Visit: Payer: Self-pay | Admitting: Cardiology

## 2015-08-23 ENCOUNTER — Other Ambulatory Visit: Payer: Self-pay | Admitting: Cardiology

## 2015-08-29 ENCOUNTER — Ambulatory Visit (INDEPENDENT_AMBULATORY_CARE_PROVIDER_SITE_OTHER): Payer: Medicare Other | Admitting: Family Medicine

## 2015-08-29 ENCOUNTER — Encounter: Payer: Self-pay | Admitting: Family Medicine

## 2015-08-29 VITALS — BP 130/70 | HR 64 | Temp 97.7°F | Resp 14 | Ht 69.0 in | Wt 202.0 lb

## 2015-08-29 DIAGNOSIS — J45901 Unspecified asthma with (acute) exacerbation: Secondary | ICD-10-CM | POA: Diagnosis not present

## 2015-08-29 MED ORDER — ALBUTEROL SULFATE HFA 108 (90 BASE) MCG/ACT IN AERS: 2.0000 | INHALATION_SPRAY | Freq: Four times a day (QID) | RESPIRATORY_TRACT | Status: DC | PRN

## 2015-08-29 MED ORDER — OMEPRAZOLE 20 MG PO CPDR: 20.0000 mg | DELAYED_RELEASE_CAPSULE | Freq: Every day | ORAL | Status: DC

## 2015-08-29 MED ORDER — PREDNISONE 20 MG PO TABS: ORAL_TABLET | ORAL | Status: DC

## 2015-08-29 NOTE — Progress Notes (Signed)
Subjective:    Patient ID: Antonio Clark, male    DOB: [birth year], 80 y.o.   MRN: [id number]  HPI 06/2015 Patient is a very pleasant 80 year old Antonio Clark male who is here today to establish care. He has no medical concerns today. Past medical history is significant for prostate cancer. Dr. Rosana Hoes implanted radioactive seeds. He is currently followed by urology. His last PSA earlier this year was 0.01. He has a history of  Tubular adenomas. Last colonoscopy was in 2013 and is not due again until 2018. Patient states that he has had Pneumovax 23, the shingles vaccine, and his annual flu vaccine. He is due however for Prevnar 13. There is also a reported history of prediabetes. I have reviewed his recent lab work at his cardiologist office. His fasting blood sugar was excellent at 88. The remainder of his metabolic panel, CBC, and his fasting lipid panel were normal except for mild thrombocytopenia with platelet count 148.  At that time, my plan was: Cancer screening is up-to-date. His prostate exam/PSA is performed at urology annually. His colonoscopy is not due again until 2018. His immunizations are up-to-date except for Prevnar 13. He will receive that today in office. His recent fasting lab work is excellent. Given his reported history of prediabetes however, I would like to check a hemoglobin A1c. Blood pressure is excellent. Patient has his eyes checked annually. Exam today is significant for mild cataracts they're otherwise asymptomatic. Regular anticipatory guidance is provided. I will await the results of his hemoglobin A1c Office Visit on 07/08/2015  Component Date Value Ref Range Status  . Hgb A1c MFr Bld 07/08/2015 6.2* <5.7 % Final   Comment:                                                                        According to the ADA Clinical Practice Recommendations for 2011, when HbA1c is used as a screening test:     >=6.5%   Diagnostic of Diabetes Mellitus            (if abnormal result  is confirmed)   5.7-6.4%   Increased risk of developing Diabetes Mellitus   References:Diagnosis and Classification of Diabetes Mellitus,Diabetes [id number] 1):S62-S69 and Standards of Medical Care in         Diabetes - 2011,Diabetes [id number] (Suppl 1):S11-S61.     . Mean Plasma Glucose 07/08/2015 131* <117 mg/dL Final  Office Visit on 07/02/2015  Component Date Value Ref Range Status  . WBC 07/02/2015 5.9  4.0 - 10.5 K/uL Final  . RBC 07/02/2015 4.71  4.22 - 5.81 MIL/uL Final  . Hemoglobin 07/02/2015 14.2  13.0 - 17.0 g/dL Final  . HCT 07/02/2015 41.8  39.0 - 52.0 % Final  . MCV 07/02/2015 88.7  78.0 - 100.0 fL Final  . MCH 07/02/2015 30.1  26.0 - 34.0 pg Final  . MCHC 07/02/2015 34.0  30.0 - 36.0 g/dL Final  . RDW 07/02/2015 13.7  11.5 - 15.5 % Final  . Platelets 07/02/2015 148* 150 - 400 K/uL Final  . MPV 07/02/2015 9.4  8.6 - 12.4 fL Final  . Sodium 07/02/2015 140  135 - 146 mmol/L Final  . Potassium 07/02/2015 4.2  3.5 - 5.3 mmol/L  Final  . Chloride 07/02/2015 103  98 - 110 mmol/L Final  . CO2 07/02/2015 27  20 - 31 mmol/L Final  . Glucose, Bld 07/02/2015 88  65 - 99 mg/dL Final  . BUN 07/02/2015 15  7 - 25 mg/dL Final  . Creat 07/02/2015 0.85  0.70 - 1.18 mg/dL Final  . Calcium 07/02/2015 9.3  8.6 - 10.3 mg/dL Final  . Cholesterol 07/02/2015 154  125 - 200 mg/dL Final  . Triglycerides 07/02/2015 172* <150 mg/dL Final  . HDL 07/02/2015 42  >=40 mg/dL Final  . Total CHOL/HDL Ratio 07/02/2015 3.7  <=5.0 Ratio Final  . VLDL 07/02/2015 34* <30 mg/dL Final  . LDL Cholesterol 07/02/2015 78  <130 mg/dL Final   Comment:   Total Cholesterol/HDL Ratio:CHD Risk                        Coronary Heart Disease Risk Table                                        Men       Women          1/2 Average Risk              3.4        3.3              Average Risk              5.0        4.4           2X Average Risk              9.6        7.1           3X Average Risk              23.4       11.0 Use the calculated Patient Ratio above and the CHD Risk table  to determine the patient's CHD Risk.    08/29/15 Usually once a year, the patient will develop a cough and wheezing. Previously his doctor treated him his reactive airway disease with prednisone and albuterol. He is only had to do this on one occasion in the past. Over the last 4 weeks he reports wheezing primarily at night with coughing and shortness of breath primarily at night. He denies any chest pain. He denies any peripheral edema. He denies any orthopnea. He denies any pleurisy. He denies any fevers or chills. He denies any rhinorrhea Past Medical History  Diagnosis Date  . Prostate CA ([place])   . Hyperlipidemia   . Borderline diabetic   . History of echocardiogram 07/ 07/ 2011  . Hypertension   . History of lithotripsy 1989   Past Surgical History  Procedure Laterality Date  . Insertion prostate radiation seed  8 4 2010    per Dr Rosana Hoes  . Throat surgery  1980    benign cyst  . Colonoscopy  2002  . Polypectomy  2002  . Urethral stricture dilatation      also penile implant   Current Outpatient Prescriptions on File Prior to Visit  Medication Sig Dispense Refill  . aspirin 81 MG tablet Take 81 mg by mouth 2 (two) times daily.     Marland Kitchen losartan-hydrochlorothiazide (HYZAAR) 100-12.5 MG tablet TAKE 1 TABLET BY MOUTH  DAILY. 30 tablet 10  . multivitamin (THERAGRAN) per tablet Take 1 tablet by mouth daily.      . Omega-3 Fatty Acids (FISH OIL) 1200 MG CAPS Take 1,200 mg by mouth daily.      . pravastatin (PRAVACHOL) 40 MG tablet TAKE 1 TABLET (40 MG TOTAL) BY MOUTH EVERY EVENING. 30 tablet 9  . Tamsulosin HCl (FLOMAX) 0.4 MG CAPS Take 0.4 mg by mouth daily.       No current facility-administered medications on file prior to visit.   No Known Allergies Social History   Social History  . Marital Status: Single    Spouse Name: N/A  . Number of Children: N/A  . Years of Education: N/A   Occupational  History  . retired    Social History Main Topics  . Smoking status: Never Smoker   . Smokeless tobacco: Never Used  . Alcohol Use: No  . Drug Use: No  . Sexual Activity: Yes   Other Topics Concern  . Not on file   Social History Narrative   Family History  Problem Relation Age of Onset  . Coronary artery disease Brother     3 brothers had CABG  . Arrhythmia Brother   . Diabetes Brother   . Hearing loss Brother   . Hypertension Brother   . Heart disease Brother   . Heart failure Brother   . Hypertension Brother   . Heart disease Father   . Hypertension Father   . Sudden death Father   . Stroke Mother     cerebral hemorrage  . Colon cancer Mother   . Cancer Mother   . Suicidality Sister   . Stroke Sister   . Diabetes Brother   . Hypertension Brother   . Hyperlipidemia Sister      Review of Systems  All other systems reviewed and are negative.      Objective:   Physical Exam  Constitutional: He appears well-developed and well-nourished. No distress.  HENT:  Head: Normocephalic and atraumatic.  Right Ear: External ear normal.  Left Ear: External ear normal.  Nose: Nose normal.  Mouth/Throat: Oropharynx is clear and moist. No oropharyngeal exudate.  Eyes: Conjunctivae and EOM are normal. Pupils are equal, round, and reactive to light. Right eye exhibits no discharge. Left eye exhibits no discharge. No scleral icterus.  Neck: Normal range of motion. Neck supple.  Cardiovascular: Normal rate, regular rhythm, normal heart sounds and intact distal pulses.  Exam reveals no gallop and no friction rub.   No murmur heard. Pulmonary/Chest: Effort normal and breath sounds normal. No respiratory distress. He has no wheezes. He has no rales. He exhibits no tenderness.  Abdominal: Soft. Bowel sounds are normal. He exhibits no distension. There is no tenderness. There is no rebound.  Lymphadenopathy:    He has no cervical adenopathy.  Skin: He is not diaphoretic.  Vitals  reviewed.         Assessment & Plan:  Reactive airway disease with acute exacerbation - Plan: predniSONE (DELTASONE) 20 MG tablet, albuterol (PROVENTIL HFA;VENTOLIN HFA) 108 (90 Base) MCG/ACT inhaler  I will treat the patient has reactive airway disease with a prednisone taper pack and albuterol 2 puffs inhaled every 4-6 hours as needed. I would like to obtain records from his previous doctor to look at the pulmonary function tests to determine if he has mild asthma. If this becomes a frequent exacerbation, he would likely benefit from taking an inhaled steroid on a daily basis to  prevent having to use prednisone. At the current time however he is only required it twice in his life and therefore I'm not willing to put him on the medication yet. There is no evidence of an infection today

## 2015-09-02 ENCOUNTER — Telehealth: Payer: Self-pay | Admitting: Family Medicine

## 2015-09-02 NOTE — Telephone Encounter (Signed)
Patient calling to let you know that he is better but is still coughing some  [phone]

## 2015-09-02 NOTE — Telephone Encounter (Signed)
LMTRC

## 2015-09-02 NOTE — Telephone Encounter (Signed)
I would suggest trying the patient on Symbicort 160/4.5, 2 puffs inhaled twice a day for possible asthma

## 2015-09-02 NOTE — Telephone Encounter (Signed)
Pt aware and sample left up front.

## 2015-10-01 DIAGNOSIS — L570 Actinic keratosis: Secondary | ICD-10-CM | POA: Diagnosis not present

## 2016-01-13 DIAGNOSIS — N200 Calculus of kidney: Secondary | ICD-10-CM | POA: Diagnosis not present

## 2016-01-13 DIAGNOSIS — N528 Other male erectile dysfunction: Secondary | ICD-10-CM | POA: Diagnosis not present

## 2016-01-13 DIAGNOSIS — N35013 Post-traumatic anterior urethral stricture: Secondary | ICD-10-CM | POA: Diagnosis not present

## 2016-01-13 DIAGNOSIS — Z8546 Personal history of malignant neoplasm of prostate: Secondary | ICD-10-CM | POA: Diagnosis not present

## 2016-01-13 DIAGNOSIS — C61 Malignant neoplasm of prostate: Secondary | ICD-10-CM | POA: Diagnosis not present

## 2016-01-15 ENCOUNTER — Other Ambulatory Visit: Payer: Medicare Other

## 2016-01-15 DIAGNOSIS — E785 Hyperlipidemia, unspecified: Secondary | ICD-10-CM | POA: Diagnosis not present

## 2016-01-15 DIAGNOSIS — I10 Essential (primary) hypertension: Secondary | ICD-10-CM

## 2016-01-15 DIAGNOSIS — R7303 Prediabetes: Secondary | ICD-10-CM | POA: Diagnosis not present

## 2016-01-15 DIAGNOSIS — R7309 Other abnormal glucose: Secondary | ICD-10-CM | POA: Diagnosis not present

## 2016-01-15 LAB — CBC WITH DIFFERENTIAL/PLATELET
BASOS PCT: 0 %
Basophils Absolute: 0 cells/uL (ref 0–200)
Eosinophils Absolute: 275 cells/uL (ref 15–500)
Eosinophils Relative: 5 %
HEMATOCRIT: 42.1 % (ref 38.5–50.0)
Hemoglobin: 14.2 g/dL (ref 13.0–17.0)
Lymphocytes Relative: 34 %
Lymphs Abs: 1870 cells/uL (ref 850–3900)
MCH: 30.3 pg (ref 27.0–33.0)
MCHC: 33.7 g/dL (ref 32.0–36.0)
MCV: 89.8 fL (ref 80.0–100.0)
MONO ABS: 385 {cells}/uL (ref 200–950)
MPV: 9.2 fL (ref 7.5–12.5)
Monocytes Relative: 7 %
NEUTROS ABS: 2970 {cells}/uL (ref 1500–7800)
Neutrophils Relative %: 54 %
Platelets: 142 10*3/uL (ref 140–400)
RBC: 4.69 MIL/uL (ref 4.20–5.80)
RDW: 13.9 % (ref 11.0–15.0)
WBC: 5.5 10*3/uL (ref 3.8–10.8)

## 2016-01-15 LAB — COMPLETE METABOLIC PANEL WITH GFR
ALK PHOS: 43 U/L (ref 40–115)
ALT: 15 U/L (ref 9–46)
AST: 16 U/L (ref 10–35)
Albumin: 4.3 g/dL (ref 3.6–5.1)
BUN: 19 mg/dL (ref 7–25)
CALCIUM: 9.1 mg/dL (ref 8.6–10.3)
CHLORIDE: 102 mmol/L (ref 98–110)
CO2: 22 mmol/L (ref 20–31)
Creat: 1.02 mg/dL (ref 0.70–1.18)
GFR, Est African American: 82 mL/min (ref >=60)
GFR, Est Non African American: 71 mL/min (ref >=60)
GLUCOSE: 134 mg/dL — AB (ref 70–99)
Potassium: 4.4 mmol/L (ref 3.5–5.3)
SODIUM: 137 mmol/L (ref 135–146)
Total Bilirubin: 0.7 mg/dL (ref 0.2–1.2)
Total Protein: 6.5 g/dL (ref 6.1–8.1)

## 2016-01-15 LAB — LIPID PANEL
Cholesterol: 161 mg/dL (ref 125–200)
HDL: 46 mg/dL (ref >=40)
LDL Cholesterol: 72 mg/dL (ref <130)
Total CHOL/HDL Ratio: 3.5 Ratio (ref <=5.0)
Triglycerides: 214 mg/dL — ABNORMAL HIGH (ref <150)
VLDL: 43 mg/dL — AB (ref <30)

## 2016-01-15 LAB — HEMOGLOBIN A1C
HEMOGLOBIN A1C: 6.5 % — AB (ref <5.7)
MEAN PLASMA GLUCOSE: 140 mg/dL

## 2016-01-20 ENCOUNTER — Ambulatory Visit (INDEPENDENT_AMBULATORY_CARE_PROVIDER_SITE_OTHER): Payer: Medicare Other | Admitting: Family Medicine

## 2016-01-20 ENCOUNTER — Encounter: Payer: Self-pay | Admitting: Family Medicine

## 2016-01-20 VITALS — BP 132/80 | HR 76 | Temp 98.1°F | Resp 18 | Ht 69.0 in | Wt 199.0 lb

## 2016-01-20 DIAGNOSIS — Z0000 Encounter for general adult medical examination without abnormal findings: Secondary | ICD-10-CM | POA: Diagnosis not present

## 2016-01-20 DIAGNOSIS — E785 Hyperlipidemia, unspecified: Secondary | ICD-10-CM

## 2016-01-20 DIAGNOSIS — Z23 Encounter for immunization: Secondary | ICD-10-CM | POA: Diagnosis not present

## 2016-01-20 DIAGNOSIS — I10 Essential (primary) hypertension: Secondary | ICD-10-CM | POA: Diagnosis not present

## 2016-01-20 DIAGNOSIS — R7303 Prediabetes: Secondary | ICD-10-CM | POA: Diagnosis not present

## 2016-01-20 NOTE — Progress Notes (Signed)
Subjective:    Patient ID: Antonio Clark, male    DOB: July 23, 1936, 80 y.o.   MRN: [id number]  HPI He has no medical concerns today. Past medical history is significant for prostate cancer. Dr. Rosana Hoes implanted radioactive seeds. He is currently followed by urology. His last PSA earlier this year was 0.04. He has a history of tubular adenomas. Last colonoscopy was in 2013 and is not due again until 2018. Patient states that he has had the shingles vaccine, and his annual flu vaccine, and prevnar 13.Most recent lab work is listed below: Lab on 01/15/2016  Component Date Value Ref Range Status  . WBC 01/15/2016 5.5  3.8 - 10.8 K/uL Final  . RBC 01/15/2016 4.69  4.20 - 5.80 MIL/uL Final  . Hemoglobin 01/15/2016 14.2  13.0 - 17.0 g/dL Final  . HCT 01/15/2016 42.1  38.5 - 50.0 % Final  . MCV 01/15/2016 89.8  80.0 - 100.0 fL Final  . MCH 01/15/2016 30.3  27.0 - 33.0 pg Final  . MCHC 01/15/2016 33.7  32.0 - 36.0 g/dL Final  . RDW 01/15/2016 13.9  11.0 - 15.0 % Final  . Platelets 01/15/2016 142  140 - 400 K/uL Final  . MPV 01/15/2016 9.2  7.5 - 12.5 fL Final  . Neutro Abs 01/15/2016 2970  1500 - 7800 cells/uL Final  . Lymphs Abs 01/15/2016 1870  850 - 3900 cells/uL Final  . Monocytes Absolute 01/15/2016 385  200 - 950 cells/uL Final  . Eosinophils Absolute 01/15/2016 275  15 - 500 cells/uL Final  . Basophils Absolute 01/15/2016 0  0 - 200 cells/uL Final  . Neutrophils Relative % 01/15/2016 54   Final  . Lymphocytes Relative 01/15/2016 34   Final  . Monocytes Relative 01/15/2016 7   Final  . Eosinophils Relative 01/15/2016 5   Final  . Basophils Relative 01/15/2016 0   Final  . Smear Review 01/15/2016 Criteria for review not met   Final   ** Please note change in unit of measure and reference range(s). **  . Sodium 01/15/2016 137  135 - 146 mmol/L Final  . Potassium 01/15/2016 4.4  3.5 - 5.3 mmol/L Final  . Chloride 01/15/2016 102  98 - 110 mmol/L Final  . CO2 01/15/2016 22  20 - 31 mmol/L  Final  . Glucose, Bld 01/15/2016 134* 70 - 99 mg/dL Final  . BUN 01/15/2016 19  7 - 25 mg/dL Final  . Creat 01/15/2016 1.02  0.70 - 1.18 mg/dL Final  . Total Bilirubin 01/15/2016 0.7  0.2 - 1.2 mg/dL Final  . Alkaline Phosphatase 01/15/2016 43  40 - 115 U/L Final  . AST 01/15/2016 16  10 - 35 U/L Final  . ALT 01/15/2016 15  9 - 46 U/L Final  . Total Protein 01/15/2016 6.5  6.1 - 8.1 g/dL Final  . Albumin 01/15/2016 4.3  3.6 - 5.1 g/dL Final  . Calcium 01/15/2016 9.1  8.6 - 10.3 mg/dL Final  . GFR, Est African American 01/15/2016 82  >=60 mL/min Final  . GFR, Est Non African American 01/15/2016 71  >=60 mL/min Final   Comment:   The estimated GFR is a calculation valid for adults (>=37 years old) that uses the CKD-EPI algorithm to adjust for age and sex. It is   not to be used for children, pregnant women, hospitalized patients,    patients on dialysis, or with rapidly changing kidney function. According to the NKDEP, eGFR >89 is normal, 60-89 shows mild  impairment, 30-59 shows moderate impairment, 15-29 shows severe impairment and <15 is ESRD.     Marland Kitchen Cholesterol 01/15/2016 161  125 - 200 mg/dL Final  . Triglycerides 01/15/2016 214* <150 mg/dL Final  . HDL 01/15/2016 46  >=40 mg/dL Final  . Total CHOL/HDL Ratio 01/15/2016 3.5  <=5.0 Ratio Final  . VLDL 01/15/2016 43* <30 mg/dL Final  . LDL Cholesterol 01/15/2016 72  <130 mg/dL Final   Comment:   Total Cholesterol/HDL Ratio:CHD Risk                        Coronary Heart Disease Risk Table                                        Men       Women          1/2 Average Risk              3.4        3.3              Average Risk              5.0        4.4           2X Average Risk              9.6        7.1           3X Average Risk             23.4       11.0 Use the calculated Patient Ratio above and the CHD Risk table  to determine the patient's CHD Risk.   . Hgb A1c MFr Bld 01/15/2016 6.5* <5.7 % Final   Comment:   For someone  without known diabetes, a hemoglobin A1c value of 6.5% or greater indicates that they may have diabetes and this should be confirmed with a follow-up test.   For someone with known diabetes, a value <7% indicates that their diabetes is well controlled and a value greater than or equal to 7% indicates suboptimal control. A1c targets should be individualized based on duration of diabetes, age, comorbid conditions, and other considerations.   Currently, no consensus exists for use of hemoglobin A1c for diagnosis of diabetes for children.     . Mean Plasma Glucose 01/15/2016 140   Final    Past Medical History  Diagnosis Date  . Prostate CA ([place])   . Hyperlipidemia   . Borderline diabetic   . History of echocardiogram 07/ 07/ 2011  . Hypertension   . History of lithotripsy 1989   Past Surgical History  Procedure Laterality Date  . Insertion prostate radiation seed  8 4 2010    per Dr Rosana Hoes  . Throat surgery  1980    benign cyst  . Colonoscopy  2002  . Polypectomy  2002  . Urethral stricture dilatation      also penile implant   Current Outpatient Prescriptions on File Prior to Visit  Medication Sig Dispense Refill  . albuterol (PROVENTIL HFA;VENTOLIN HFA) 108 (90 Base) MCG/ACT inhaler Inhale 2 puffs into the lungs every 6 (six) hours as needed for wheezing or shortness of breath. 1 Inhaler 0  . aspirin 81 MG tablet Take 81 mg by mouth 2 (two) times daily.     Marland Kitchen  losartan-hydrochlorothiazide (HYZAAR) 100-12.5 MG tablet TAKE 1 TABLET BY MOUTH DAILY. 30 tablet 10  . multivitamin (THERAGRAN) per tablet Take 1 tablet by mouth daily.      . Omega-3 Fatty Acids (FISH OIL) 1200 MG CAPS Take 1,200 mg by mouth daily.      Marland Kitchen omeprazole (PRILOSEC) 20 MG capsule Take 1 capsule (20 mg total) by mouth daily. 90 capsule 4  . pravastatin (PRAVACHOL) 40 MG tablet TAKE 1 TABLET (40 MG TOTAL) BY MOUTH EVERY EVENING. 30 tablet 9  . Tamsulosin HCl (FLOMAX) 0.4 MG CAPS Take 0.4 mg by mouth daily.        No current facility-administered medications on file prior to visit.   No Known Allergies Social History   Social History  . Marital Status: Single    Spouse Name: N/A  . Number of Children: N/A  . Years of Education: N/A   Occupational History  . retired    Social History Main Topics  . Smoking status: Never Smoker   . Smokeless tobacco: Never Used  . Alcohol Use: No  . Drug Use: No  . Sexual Activity: Yes   Other Topics Concern  . Not on file   Social History Narrative   Family History  Problem Relation Age of Onset  . Coronary artery disease Brother     3 brothers had CABG  . Arrhythmia Brother   . Diabetes Brother   . Hearing loss Brother   . Hypertension Brother   . Heart disease Brother   . Heart failure Brother   . Hypertension Brother   . Heart disease Father   . Hypertension Father   . Sudden death Father   . Stroke Mother     cerebral hemorrage  . Colon cancer Mother   . Cancer Mother   . Suicidality Sister   . Stroke Sister   . Diabetes Brother   . Hypertension Brother   . Hyperlipidemia Sister      Review of Systems  All other systems reviewed and are negative.      Objective:   Physical Exam  Constitutional: He is oriented to person, place, and time. He appears well-developed and well-nourished. No distress.  HENT:  Head: Normocephalic and atraumatic.  Right Ear: External ear normal.  Left Ear: External ear normal.  Nose: Nose normal.  Mouth/Throat: Oropharynx is clear and moist. No oropharyngeal exudate.  Eyes: Conjunctivae and EOM are normal. Pupils are equal, round, and reactive to light. Right eye exhibits no discharge. Left eye exhibits no discharge. No scleral icterus.  Neck: Normal range of motion. Neck supple. No JVD present. No tracheal deviation present. No thyromegaly present.  Cardiovascular: Normal rate, regular rhythm, normal heart sounds and intact distal pulses.  Exam reveals no gallop and no friction rub.   No  murmur heard. Pulmonary/Chest: Effort normal and breath sounds normal. No stridor. No respiratory distress. He has no wheezes. He has no rales. He exhibits no tenderness.  Abdominal: Soft. Bowel sounds are normal. He exhibits no distension and no mass. There is no tenderness. There is no rebound and no guarding.  Musculoskeletal: Normal range of motion. He exhibits no edema or tenderness.  Lymphadenopathy:    He has no cervical adenopathy.  Neurological: He is alert and oriented to person, place, and time. He has normal reflexes. No cranial nerve deficit. He exhibits normal muscle tone. Coordination normal.  Skin: Skin is warm. No rash noted. He is not diaphoretic. No erythema. No pallor.  Psychiatric:  He has a normal mood and affect. His behavior is normal. Judgment and thought content normal.  Vitals reviewed. patient does have small actinic keratoses on his forehead, he sees Dr. [place] annually.        Assessment & Plan:  Need for prophylactic vaccination against Streptococcus pneumoniae (pneumococcus) - Plan: Pneumococcal polysaccharide vaccine 23-valent greater than or equal to 2yo subcutaneous/IM  Routine general medical examination at a health care facility  Benign essential HTN  HLD (hyperlipidemia)  Prediabetes  PSA is stable and followed by urology. Colonoscopy is up-to-date. He received Pneumovax 23 today in clinic. Otherwise immunizations are up-to-date. Cholesterol is excellent. Lab work is significant for diabetes with a hemoglobin A1c of 6.5. We spent 15 minutes discussing therapeutic lifestyle changes including diet exercise and low carbohydrate diet. I will recheck this in 6 months. The remainder of his preventative care is up-to-date.

## 2016-04-07 DIAGNOSIS — H52203 Unspecified astigmatism, bilateral: Secondary | ICD-10-CM | POA: Diagnosis not present

## 2016-04-07 DIAGNOSIS — H25813 Combined forms of age-related cataract, bilateral: Secondary | ICD-10-CM | POA: Diagnosis not present

## 2016-05-04 DIAGNOSIS — M62830 Muscle spasm of back: Secondary | ICD-10-CM | POA: Diagnosis not present

## 2016-05-04 DIAGNOSIS — M5136 Other intervertebral disc degeneration, lumbar region: Secondary | ICD-10-CM | POA: Diagnosis not present

## 2016-05-04 DIAGNOSIS — M9903 Segmental and somatic dysfunction of lumbar region: Secondary | ICD-10-CM | POA: Diagnosis not present

## 2016-05-04 DIAGNOSIS — M9902 Segmental and somatic dysfunction of thoracic region: Secondary | ICD-10-CM | POA: Diagnosis not present

## 2016-05-06 DIAGNOSIS — M62830 Muscle spasm of back: Secondary | ICD-10-CM | POA: Diagnosis not present

## 2016-05-06 DIAGNOSIS — M9903 Segmental and somatic dysfunction of lumbar region: Secondary | ICD-10-CM | POA: Diagnosis not present

## 2016-05-06 DIAGNOSIS — M5136 Other intervertebral disc degeneration, lumbar region: Secondary | ICD-10-CM | POA: Diagnosis not present

## 2016-05-06 DIAGNOSIS — M9902 Segmental and somatic dysfunction of thoracic region: Secondary | ICD-10-CM | POA: Diagnosis not present

## 2016-05-11 DIAGNOSIS — M62830 Muscle spasm of back: Secondary | ICD-10-CM | POA: Diagnosis not present

## 2016-05-11 DIAGNOSIS — M9902 Segmental and somatic dysfunction of thoracic region: Secondary | ICD-10-CM | POA: Diagnosis not present

## 2016-05-11 DIAGNOSIS — M9903 Segmental and somatic dysfunction of lumbar region: Secondary | ICD-10-CM | POA: Diagnosis not present

## 2016-05-11 DIAGNOSIS — M5136 Other intervertebral disc degeneration, lumbar region: Secondary | ICD-10-CM | POA: Diagnosis not present

## 2016-05-12 ENCOUNTER — Ambulatory Visit (INDEPENDENT_AMBULATORY_CARE_PROVIDER_SITE_OTHER): Payer: Medicare Other

## 2016-05-12 DIAGNOSIS — Z23 Encounter for immunization: Secondary | ICD-10-CM

## 2016-05-13 DIAGNOSIS — M9903 Segmental and somatic dysfunction of lumbar region: Secondary | ICD-10-CM | POA: Diagnosis not present

## 2016-05-13 DIAGNOSIS — M5136 Other intervertebral disc degeneration, lumbar region: Secondary | ICD-10-CM | POA: Diagnosis not present

## 2016-05-13 DIAGNOSIS — M9902 Segmental and somatic dysfunction of thoracic region: Secondary | ICD-10-CM | POA: Diagnosis not present

## 2016-05-13 DIAGNOSIS — M62830 Muscle spasm of back: Secondary | ICD-10-CM | POA: Diagnosis not present

## 2016-05-18 DIAGNOSIS — M5136 Other intervertebral disc degeneration, lumbar region: Secondary | ICD-10-CM | POA: Diagnosis not present

## 2016-05-18 DIAGNOSIS — M9902 Segmental and somatic dysfunction of thoracic region: Secondary | ICD-10-CM | POA: Diagnosis not present

## 2016-05-18 DIAGNOSIS — M62830 Muscle spasm of back: Secondary | ICD-10-CM | POA: Diagnosis not present

## 2016-05-18 DIAGNOSIS — M9903 Segmental and somatic dysfunction of lumbar region: Secondary | ICD-10-CM | POA: Diagnosis not present

## 2016-05-25 DIAGNOSIS — M5136 Other intervertebral disc degeneration, lumbar region: Secondary | ICD-10-CM | POA: Diagnosis not present

## 2016-05-25 DIAGNOSIS — M9903 Segmental and somatic dysfunction of lumbar region: Secondary | ICD-10-CM | POA: Diagnosis not present

## 2016-05-25 DIAGNOSIS — M9902 Segmental and somatic dysfunction of thoracic region: Secondary | ICD-10-CM | POA: Diagnosis not present

## 2016-05-25 DIAGNOSIS — M62830 Muscle spasm of back: Secondary | ICD-10-CM | POA: Diagnosis not present

## 2016-06-14 ENCOUNTER — Other Ambulatory Visit: Payer: Self-pay | Admitting: Cardiology

## 2016-06-21 ENCOUNTER — Other Ambulatory Visit: Payer: Self-pay | Admitting: Cardiology

## 2016-06-23 DIAGNOSIS — R3912 Poor urinary stream: Secondary | ICD-10-CM | POA: Diagnosis not present

## 2016-06-23 DIAGNOSIS — C61 Malignant neoplasm of prostate: Secondary | ICD-10-CM | POA: Diagnosis not present

## 2016-06-23 DIAGNOSIS — N529 Male erectile dysfunction, unspecified: Secondary | ICD-10-CM | POA: Diagnosis not present

## 2016-06-23 DIAGNOSIS — N35013 Post-traumatic anterior urethral stricture: Secondary | ICD-10-CM | POA: Diagnosis not present

## 2016-06-23 DIAGNOSIS — E291 Testicular hypofunction: Secondary | ICD-10-CM | POA: Diagnosis not present

## 2016-07-16 ENCOUNTER — Other Ambulatory Visit: Payer: Self-pay | Admitting: Cardiology

## 2016-07-17 DIAGNOSIS — N35013 Post-traumatic anterior urethral stricture: Secondary | ICD-10-CM | POA: Diagnosis not present

## 2016-07-17 DIAGNOSIS — N200 Calculus of kidney: Secondary | ICD-10-CM | POA: Diagnosis not present

## 2016-07-17 DIAGNOSIS — N529 Male erectile dysfunction, unspecified: Secondary | ICD-10-CM | POA: Diagnosis not present

## 2016-07-17 DIAGNOSIS — Z8546 Personal history of malignant neoplasm of prostate: Secondary | ICD-10-CM | POA: Diagnosis not present

## 2016-07-17 DIAGNOSIS — E291 Testicular hypofunction: Secondary | ICD-10-CM | POA: Diagnosis not present

## 2016-07-24 ENCOUNTER — Ambulatory Visit (INDEPENDENT_AMBULATORY_CARE_PROVIDER_SITE_OTHER): Payer: Medicare Other | Admitting: Family Medicine

## 2016-07-24 VITALS — BP 136/76 | HR 60 | Temp 98.1°F | Resp 18 | Ht 69.0 in | Wt 200.0 lb

## 2016-07-24 DIAGNOSIS — Z79899 Other long term (current) drug therapy: Secondary | ICD-10-CM | POA: Diagnosis not present

## 2016-07-24 DIAGNOSIS — E7800 Pure hypercholesterolemia, unspecified: Secondary | ICD-10-CM | POA: Diagnosis not present

## 2016-07-24 DIAGNOSIS — R7303 Prediabetes: Secondary | ICD-10-CM

## 2016-07-24 DIAGNOSIS — I10 Essential (primary) hypertension: Secondary | ICD-10-CM | POA: Diagnosis not present

## 2016-07-24 LAB — HEMOGLOBIN A1C
Hgb A1c MFr Bld: 5.7 % — ABNORMAL HIGH (ref <5.7)
Mean Plasma Glucose: 117 mg/dL

## 2016-07-24 LAB — COMPLETE METABOLIC PANEL WITH GFR
ALT: 14 U/L (ref 9–46)
AST: 16 U/L (ref 10–35)
Albumin: 4.5 g/dL (ref 3.6–5.1)
Alkaline Phosphatase: 52 U/L (ref 40–115)
BUN: 18 mg/dL (ref 7–25)
CALCIUM: 9.4 mg/dL (ref 8.6–10.3)
CHLORIDE: 104 mmol/L (ref 98–110)
CO2: 25 mmol/L (ref 20–31)
Creat: 1.02 mg/dL (ref 0.70–1.18)
GFR, EST NON AFRICAN AMERICAN: 70 mL/min (ref >=60)
GFR, Est African American: 81 mL/min (ref >=60)
Glucose, Bld: 135 mg/dL — ABNORMAL HIGH (ref 70–99)
POTASSIUM: 4.2 mmol/L (ref 3.5–5.3)
Sodium: 139 mmol/L (ref 135–146)
Total Bilirubin: 0.5 mg/dL (ref 0.2–1.2)
Total Protein: 6.8 g/dL (ref 6.1–8.1)

## 2016-07-24 LAB — CBC WITH DIFFERENTIAL/PLATELET
Basophils Absolute: 0 cells/uL (ref 0–200)
Basophils Relative: 0 %
Eosinophils Absolute: 204 cells/uL (ref 15–500)
Eosinophils Relative: 4 %
HEMATOCRIT: 40.8 % (ref 38.5–50.0)
Hemoglobin: 13.8 g/dL (ref 13.0–17.0)
Lymphocytes Relative: 26 %
Lymphs Abs: 1326 cells/uL (ref 850–3900)
MCH: 30.4 pg (ref 27.0–33.0)
MCHC: 33.8 g/dL (ref 32.0–36.0)
MCV: 89.9 fL (ref 80.0–100.0)
MONO ABS: 510 {cells}/uL (ref 200–950)
MPV: 9.4 fL (ref 7.5–12.5)
Monocytes Relative: 10 %
NEUTROS PCT: 60 %
Neutro Abs: 3060 cells/uL (ref 1500–7800)
Platelets: 132 10*3/uL — ABNORMAL LOW (ref 140–400)
RBC: 4.54 MIL/uL (ref 4.20–5.80)
RDW: 13.9 % (ref 11.0–15.0)
WBC: 5.1 10*3/uL (ref 3.8–10.8)

## 2016-07-24 LAB — LIPID PANEL
Cholesterol: 147 mg/dL (ref <200)
HDL: 52 mg/dL (ref >40)
LDL Cholesterol: 73 mg/dL (ref <100)
Total CHOL/HDL Ratio: 2.8 Ratio (ref <5.0)
Triglycerides: 111 mg/dL (ref <150)
VLDL: 22 mg/dL (ref <30)

## 2016-07-24 NOTE — Progress Notes (Signed)
Subjective:    Patient ID: Antonio Clark, male    DOB: 05/01/1936, 80 y.o.   MRN: [id number]  HPI Patient is here today for follow-up of his diabetes. He is not checking his blood sugars. However he denies any polyuria, polydipsia, or blurred vision. He denies any neuropathy in his feet or in his legs. He denies any hypoglycemic episodes. His blood pressure today is adequately controlled. He denies any chest pain shortness of breath or dyspnea on exertion. He denies any myalgias or right upper quadrant pain on his statin medication. His immunizations are up-to-date. Past Medical History:  Diagnosis Date  . Borderline diabetic   . History of echocardiogram 07/ 07/ 2011  . History of lithotripsy 1989  . Hyperlipidemia   . Hypertension   . Prostate CA [facility])    Past Surgical History:  Procedure Laterality Date  . COLONOSCOPY  2002  . INSERTION PROSTATE RADIATION SEED  8 4 2010   per Dr Rosana Hoes  . POLYPECTOMY  2002  . THROAT SURGERY  1980   benign cyst  . URETHRAL STRICTURE DILATATION     also penile implant   Current Outpatient Prescriptions on File Prior to Visit  Medication Sig Dispense Refill  . albuterol (PROVENTIL HFA;VENTOLIN HFA) 108 (90 Base) MCG/ACT inhaler Inhale 2 puffs into the lungs every 6 (six) hours as needed for wheezing or shortness of breath. 1 Inhaler 0  . aspirin 81 MG tablet Take 81 mg by mouth 2 (two) times daily.     Marland Kitchen losartan-hydrochlorothiazide (HYZAAR) 100-12.5 MG tablet Take 1 tablet by mouth daily. *Please call and schedule an appointment for further refills* 15 tablet 0  . multivitamin (THERAGRAN) per tablet Take 1 tablet by mouth daily.      . Omega-3 Fatty Acids (FISH OIL) 1200 MG CAPS Take 1,200 mg by mouth daily.      Marland Kitchen omeprazole (PRILOSEC) 20 MG capsule Take 1 capsule (20 mg total) by mouth daily. 90 capsule 4  . pravastatin (PRAVACHOL) 40 MG tablet TAKE 1 TABLET (40 MG TOTAL) BY MOUTH EVERY EVENING. 30 tablet 0  . Tamsulosin HCl (FLOMAX) 0.4 MG  CAPS Take 0.4 mg by mouth daily.       No current facility-administered medications on file prior to visit.    No Known Allergies Social History   Social History  . Marital status: Single    Spouse name: N/A  . Number of children: N/A  . Years of education: N/A   Occupational History  . retired    Social History Main Topics  . Smoking status: Never Smoker  . Smokeless tobacco: Never Used  . Alcohol use No  . Drug use: No  . Sexual activity: Yes   Other Topics Concern  . Not on file   Social History Narrative  . No narrative on file      Review of Systems  All other systems reviewed and are negative.      Objective:   Physical Exam  Neck: Neck supple. No JVD present.  Cardiovascular: Normal rate, regular rhythm and normal heart sounds.   Pulmonary/Chest: Effort normal and breath sounds normal. No respiratory distress. He has no wheezes. He has no rales.  Abdominal: Soft. Bowel sounds are normal. He exhibits no distension. There is no tenderness. There is no rebound.  Musculoskeletal: He exhibits no edema.  Vitals reviewed.         Assessment & Plan:  Benign essential HTN  Pure hypercholesterolemia  Prediabetes -  Plan: CBC with Differential/Platelet, COMPLETE METABOLIC PANEL WITH GFR, Lipid panel, Hemoglobin A1c Blood pressures acceptable. Check fasting lipid panel. Goal LDL cholesterol is less than 100. Check hemoglobin A1c. Goal hemoglobin A1c is less than 6.5. Recommended annual diabetic eye exam. Immunizations are up-to-date.

## 2016-07-26 IMAGING — CR DG RIBS W/ CHEST 3+V*R*
3 series · 3 of 3 positions shown · non-contrast
Comparison: Chest radiograph 07/26/2013.

CLINICAL DATA: RIGHT rib pain. Symptoms for 3 months. Chest wall
discomfort. No trauma.

EXAM:
RIGHT RIBS AND CHEST - 3+ VIEW

[w chest pa]
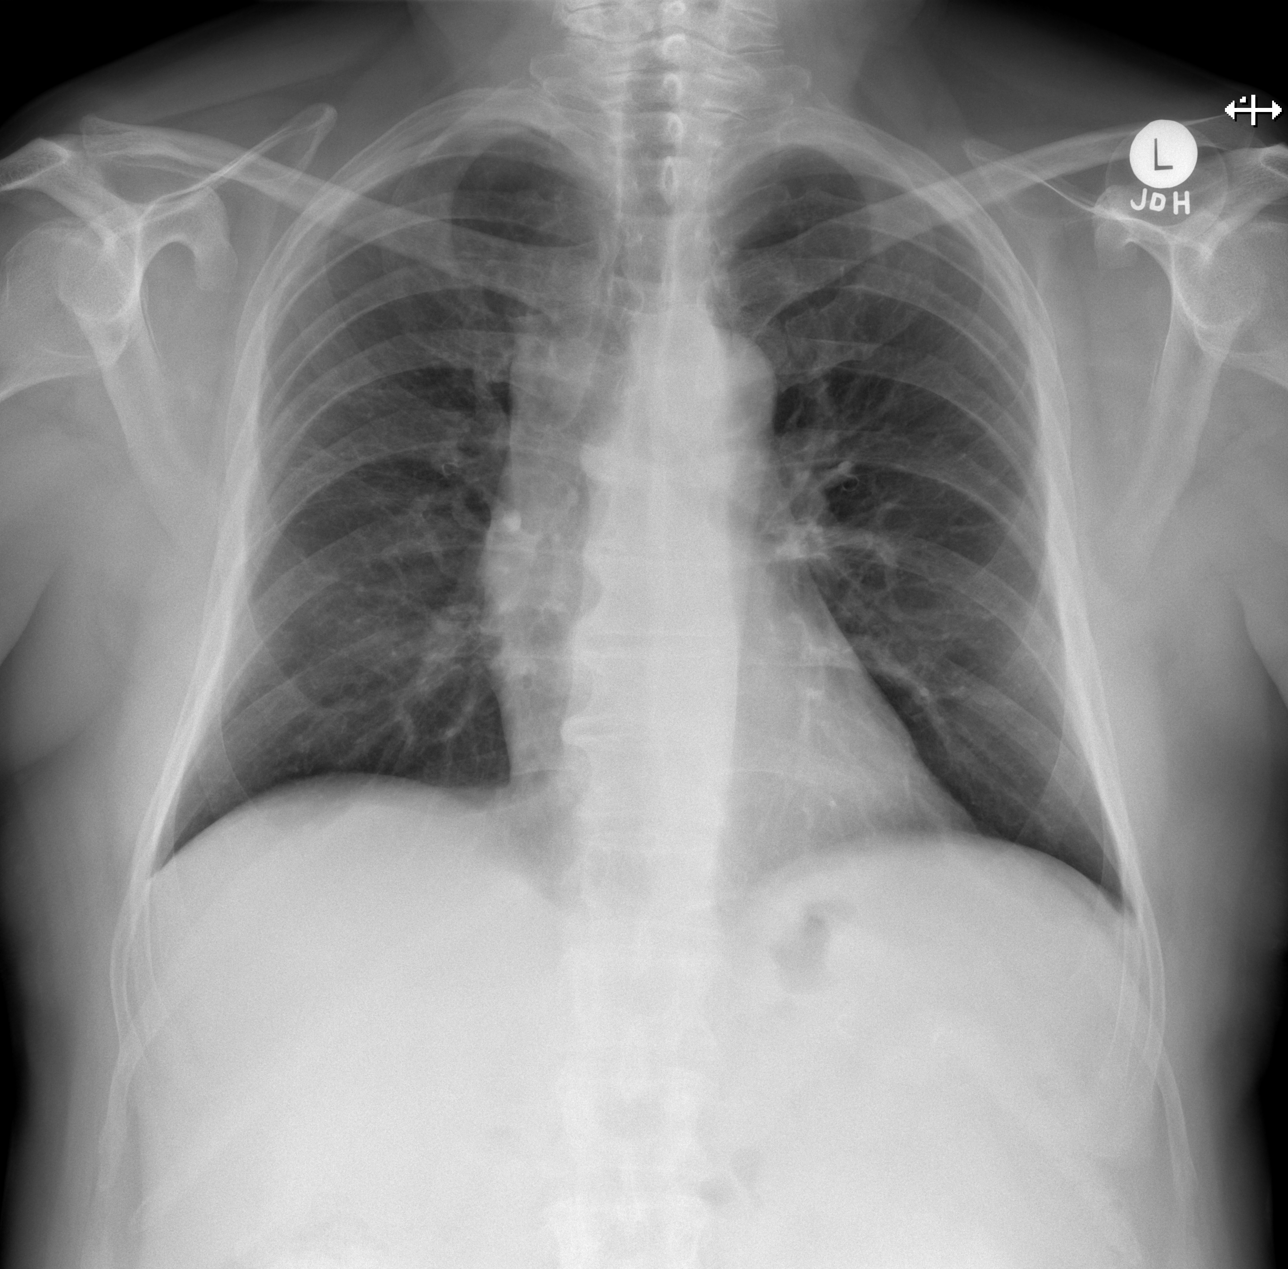

[w ribs ap lower right]
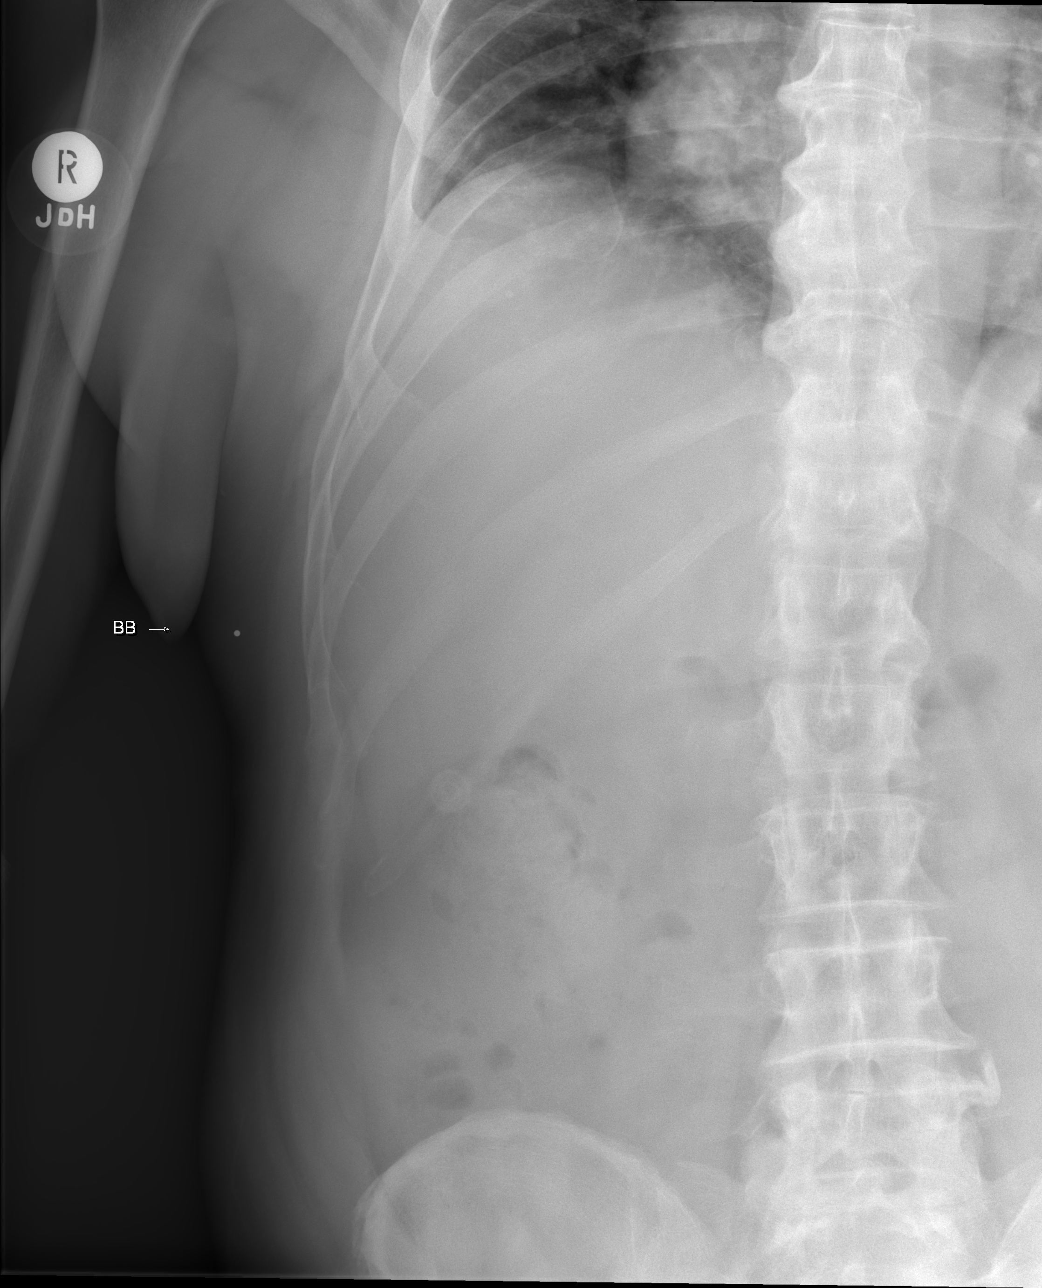

[w ribs obl right]
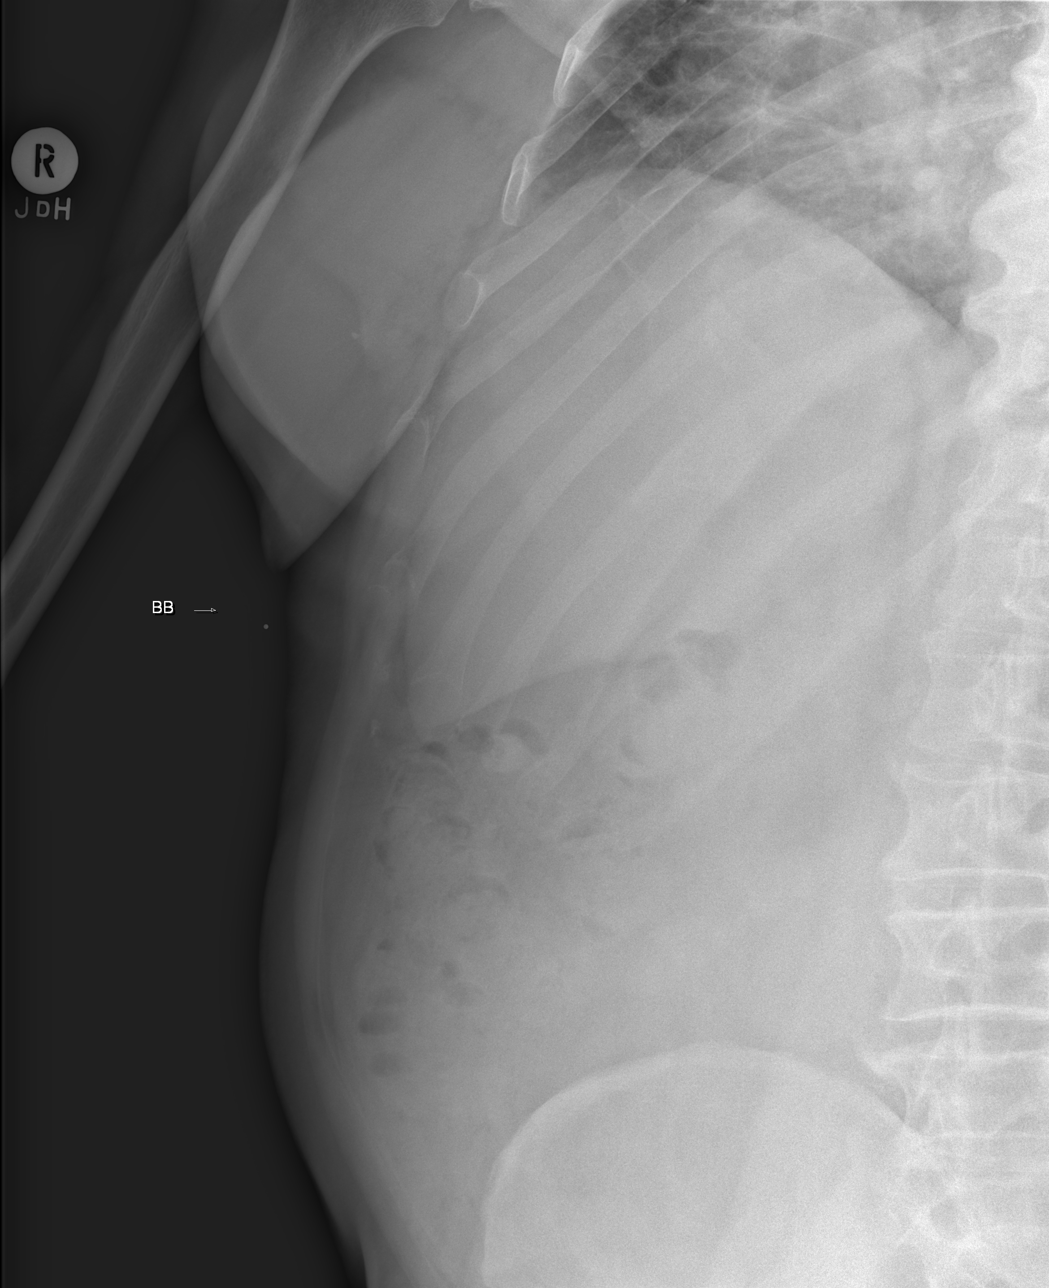

[3 of 3 positions shown; findings below may reference images not displayed]

FINDINGS: Normal heart size with clear lung fields.  No bony abnormality.

Area of concern is marked with a BB. Rib detail demonstrates occult
or healing fracture. No osseous expansile or destructive lesion.
Chest wall appears grossly unremarkable.
IMPRESSION: Negative.

## 2016-08-04 ENCOUNTER — Other Ambulatory Visit: Payer: Self-pay | Admitting: Cardiology

## 2016-08-07 DIAGNOSIS — N35013 Post-traumatic anterior urethral stricture: Secondary | ICD-10-CM | POA: Diagnosis not present

## 2016-08-07 DIAGNOSIS — N200 Calculus of kidney: Secondary | ICD-10-CM | POA: Diagnosis not present

## 2016-08-07 DIAGNOSIS — N528 Other male erectile dysfunction: Secondary | ICD-10-CM | POA: Diagnosis not present

## 2016-08-07 DIAGNOSIS — C61 Malignant neoplasm of prostate: Secondary | ICD-10-CM | POA: Diagnosis not present

## 2016-08-07 DIAGNOSIS — Z8546 Personal history of malignant neoplasm of prostate: Secondary | ICD-10-CM | POA: Diagnosis not present

## 2016-09-06 ENCOUNTER — Other Ambulatory Visit: Payer: Self-pay | Admitting: Cardiology

## 2016-09-09 ENCOUNTER — Encounter: Payer: Self-pay | Admitting: Cardiology

## 2016-09-09 ENCOUNTER — Ambulatory Visit (INDEPENDENT_AMBULATORY_CARE_PROVIDER_SITE_OTHER): Payer: Medicare Other | Admitting: Cardiology

## 2016-09-09 VITALS — BP 138/80 | HR 64 | Ht 69.0 in | Wt 202.0 lb

## 2016-09-09 DIAGNOSIS — Z79899 Other long term (current) drug therapy: Secondary | ICD-10-CM

## 2016-09-09 DIAGNOSIS — I10 Essential (primary) hypertension: Secondary | ICD-10-CM | POA: Diagnosis not present

## 2016-09-09 DIAGNOSIS — E7800 Pure hypercholesterolemia, unspecified: Secondary | ICD-10-CM

## 2016-09-09 DIAGNOSIS — Z8249 Family history of ischemic heart disease and other diseases of the circulatory system: Secondary | ICD-10-CM

## 2016-09-09 NOTE — Patient Instructions (Signed)

## 2016-09-09 NOTE — Progress Notes (Signed)
Cardiology Office Note    Date:  09/09/2016   ID:  Antonio Clark, DOB 1935/10/17, MRN [id number]  PCP:  Odette Fraction, MD  Cardiologist:   Candee Furbish, MD     History of Present Illness:  Antonio Clark is a 81 y.o. male with history of diabetes or hypertension, hyperlipidemia seen last in clinic by Dr. Aundra Dubin on 07/02/15 here for follow-up. Antonio Clark.   Strong family history of CAD. 3 family members have bypass. He also has another sister that is in her 74s. Tolerating pravastatin. Cardiolite 2013 atypical chest pain-normal. EKG with normal sinus rhythm, PVCs.  Blood sugar has been elevated recently. Blood pressure at times can be elevated as well. Compliant with medications.  Walks at the [facility] most days to 3 miles.  Retired from [employer] and [employer]. Has been taking aspirin 81 mg for prevention.  Enjoys going on [employer] trips. [place].  Past Medical History:  Diagnosis Date  . Borderline diabetic   . History of echocardiogram 07/ 07/ 2011  . History of lithotripsy 1989  . Hyperlipidemia   . Hypertension   . Prostate CA [facility])     Past Surgical History:  Procedure Laterality Date  . COLONOSCOPY  2002  . INSERTION PROSTATE RADIATION SEED  8 4 2010   per Dr Rosana Hoes  . POLYPECTOMY  2002  . THROAT SURGERY  1980   benign cyst  . URETHRAL STRICTURE DILATATION     also penile implant    Current Medications: Outpatient Medications Prior to Visit  Medication Sig Dispense Refill  . albuterol (PROVENTIL HFA;VENTOLIN HFA) 108 (90 Base) MCG/ACT inhaler Inhale 2 puffs into the lungs every 6 (six) hours as needed for wheezing or shortness of breath. 1 Inhaler 0  . aspirin 81 MG tablet Take 81 mg by mouth 2 (two) times daily.     Marland Kitchen losartan-hydrochlorothiazide (HYZAAR) 100-12.5 MG tablet Take 1 tablet by mouth daily. 30 tablet 0  . multivitamin (THERAGRAN) per tablet Take 1 tablet by mouth daily.      . Omega-3 Fatty Acids (FISH OIL) 1200 MG CAPS Take 1,200 mg by  mouth daily.      Marland Kitchen omeprazole (PRILOSEC) 20 MG capsule Take 1 capsule (20 mg total) by mouth daily. 90 capsule 4  . pravastatin (PRAVACHOL) 40 MG tablet TAKE 1 TABLET (40 MG TOTAL) BY MOUTH EVERY EVENING. 30 tablet 0  . Tamsulosin HCl (FLOMAX) 0.4 MG CAPS Take 0.4 mg by mouth daily.       No facility-administered medications prior to visit.      Allergies:   Patient has no known allergies.   Social History   Social History  . Marital status: Single    Spouse name: N/A  . Number of children: N/A  . Years of education: N/A   Occupational History  . retired    Social History Main Topics  . Smoking status: Never Smoker  . Smokeless tobacco: Never Used  . Alcohol use No  . Drug use: No  . Sexual activity: Yes   Other Topics Concern  . None   Social History Narrative  . None     Family History:  The patient's family history includes Arrhythmia in his brother; Clark in his mother; Colon Clark in his mother; Coronary artery disease in his brother; Diabetes in his brother and brother; Hearing loss in his brother; Heart disease in his brother and father; Heart failure in his brother; Hyperlipidemia in his sister; Hypertension in his  brother, brother, brother, and father; Stroke in his mother and sister; Sudden death in his father; Suicidality in his sister.   ROS:   Please see the history of present illness.    ROS All other systems reviewed and are negative.   PHYSICAL EXAM:   VS:  BP 138/80   Pulse 64   Ht 5\' 9"  (1.753 m)   Wt 202 lb (91.6 kg)   BMI 29.83 kg/m    GEN: Well nourished, well developed, in no acute distress  HEENT: normal  Neck: no JVD, carotid bruits, or masses Cardiac: RRR; no murmurs, rubs, or gallops,no edema  Respiratory:  clear to auscultation bilaterally, normal work of breathing GI: soft, nontender, nondistended, + BS, protuberant abdomen MS: no deformity or atrophy  Skin: warm and dry, no rash Neuro:  Alert and Oriented x 3, Strength and  sensation are intact Psych: euthymic mood, full affect  Wt Readings from Last 3 Encounters:  09/09/16 202 lb (91.6 kg)  07/24/16 200 lb (90.7 kg)  01/20/16 199 lb (90.3 kg)      Studies/Labs Reviewed:   EKG:  EKG is ordered today.  The ekg ordered today demonstrates 09/09/16-sinus rhythm 64 with no other abnormalities. Personally viewed.  Recent Labs: 07/24/2016: ALT 14; BUN 18; Creat 1.02; Hemoglobin 13.8; Platelets 132; Potassium 4.2; Sodium 139   Lipid Panel    Component Value Date/Time   CHOL 147 07/24/2016 0816   TRIG 111 07/24/2016 0816   HDL 52 07/24/2016 0816   CHOLHDL 2.8 07/24/2016 0816   VLDL 22 07/24/2016 0816   LDLCALC 73 07/24/2016 0816    Additional studies/ records that were reviewed today include:  Prior medical records reviewed, lab work reviewed. EKG reviewed.    ASSESSMENT:    1. Essential hypertension   2. Pure hypercholesterolemia   3. Long-term use of high-risk medication   4. Family history of early CAD      PLAN:  In order of problems listed above:  Family history of CAD  - Continue with primary prevention efforts. Aspirin given increased risk.  Essential hypertension  - Medications reviewed. Stable.   Hyperlipidemia  - Pravastatin.  - LDL 73  Elevated glucose  - Strongly encouraged weight loss. This will help not only with his glucose but also with his blood pressure.    Medication Adjustments/Labs and Tests Ordered: Current medicines are reviewed at length with the patient today.  Concerns regarding medicines are outlined above.  Medication changes, Labs and Tests ordered today are listed in the Patient Instructions below. Patient Instructions  Medication Instructions:  The current medical regimen is effective;  continue present plan and medications.  Follow-Up: Follow up in 1 year with Dr. Marlou Porch.  You will receive a letter in the mail 2 months before you are due.  Please call us when you receive this letter to schedule  your follow up appointment.  If you need a refill on your cardiac medications before your next appointment, please call your pharmacy.  Thank you for choosing [facility]!!        Signed, Candee Furbish, MD  09/09/2016 10:43 AM    [facility] [address], [place], [place]  [address] Phone: [phone]; Fax: [phone]

## 2016-09-10 ENCOUNTER — Other Ambulatory Visit: Payer: Self-pay | Admitting: Cardiology

## 2016-09-21 ENCOUNTER — Encounter: Payer: Self-pay | Admitting: Family Medicine

## 2016-09-21 ENCOUNTER — Ambulatory Visit (INDEPENDENT_AMBULATORY_CARE_PROVIDER_SITE_OTHER): Payer: Medicare Other | Admitting: Family Medicine

## 2016-09-21 VITALS — BP 116/74 | HR 68 | Temp 101.3°F | Resp 16 | Ht 69.0 in | Wt 200.0 lb

## 2016-09-21 DIAGNOSIS — J101 Influenza due to other identified influenza virus with other respiratory manifestations: Secondary | ICD-10-CM

## 2016-09-21 DIAGNOSIS — R509 Fever, unspecified: Secondary | ICD-10-CM | POA: Diagnosis not present

## 2016-09-21 LAB — INFLUENZA A AND B AG, IMMUNOASSAY
Influenza A Antigen: DETECTED — AB
Influenza B Antigen: NOT DETECTED

## 2016-09-21 MED ORDER — OSELTAMIVIR PHOSPHATE 75 MG PO CAPS: 75.0000 mg | ORAL_CAPSULE | Freq: Two times a day (BID) | ORAL | 0 refills | Status: DC

## 2016-09-21 NOTE — Progress Notes (Signed)
Subjective:    Patient ID: Antonio Clark, male    DOB: 1935/11/10, 81 y.o.   MRN: [id number]  HPI Symptoms began less than 48 hours ago. Symptoms consist of fever to 102, diffuse body aches, mild rhinorrhea, mild cough. He denies any shortness of breath or chest pain. He denies any sore throat. He denies any sinus pain. He denies any nausea vomiting or diarrhea. He denies any dysuria or rash. Flu test is positive Past Medical History:  Diagnosis Date  . Borderline diabetic   . History of echocardiogram 07/ 07/ 2011  . History of lithotripsy 1989  . Hyperlipidemia   . Hypertension   . Prostate CA [facility])    Past Surgical History:  Procedure Laterality Date  . COLONOSCOPY  2002  . INSERTION PROSTATE RADIATION SEED  8 4 2010   per Dr Rosana Hoes  . POLYPECTOMY  2002  . THROAT SURGERY  1980   benign cyst  . URETHRAL STRICTURE DILATATION     also penile implant   Current Outpatient Prescriptions on File Prior to Visit  Medication Sig Dispense Refill  . albuterol (PROVENTIL HFA;VENTOLIN HFA) 108 (90 Base) MCG/ACT inhaler Inhale 2 puffs into the lungs every 6 (six) hours as needed for wheezing or shortness of breath. 1 Inhaler 0  . aspirin 81 MG tablet Take 81 mg by mouth 2 (two) times daily.     Marland Kitchen losartan-hydrochlorothiazide (HYZAAR) 100-12.5 MG tablet Take 1 tablet by mouth daily. 30 tablet 0  . multivitamin (THERAGRAN) per tablet Take 1 tablet by mouth daily.      . Omega-3 Fatty Acids (FISH OIL) 1200 MG CAPS Take 1,200 mg by mouth daily.      Marland Kitchen omeprazole (PRILOSEC) 20 MG capsule Take 1 capsule (20 mg total) by mouth daily. 90 capsule 4  . pravastatin (PRAVACHOL) 40 MG tablet TAKE 1 TABLET (40 MG TOTAL) BY MOUTH EVERY EVENING. 90 tablet 3  . Tamsulosin HCl (FLOMAX) 0.4 MG CAPS Take 0.4 mg by mouth daily.       No current facility-administered medications on file prior to visit.    No Known Allergies Social History   Social History  . Marital status: Single    Spouse name: N/A  .  Number of children: N/A  . Years of education: N/A   Occupational History  . retired    Social History Main Topics  . Smoking status: Never Smoker  . Smokeless tobacco: Never Used  . Alcohol use No  . Drug use: No  . Sexual activity: Yes   Other Topics Concern  . Not on file   Social History Narrative  . No narrative on file      Review of Systems  All other systems reviewed and are negative.      Objective:   Physical Exam  Constitutional: He appears well-developed and well-nourished.  HENT:  Right Ear: External ear normal.  Left Ear: External ear normal.  Nose: Nose normal.  Mouth/Throat: Oropharynx is clear and moist. No oropharyngeal exudate.  Eyes: Conjunctivae are normal.  Neck: Neck supple.  Cardiovascular: Normal rate, regular rhythm and normal heart sounds.   No murmur heard. Pulmonary/Chest: Effort normal and breath sounds normal. No respiratory distress. He has no wheezes. He has no rales.  Abdominal: Soft. Bowel sounds are normal.  Lymphadenopathy:    He has no cervical adenopathy.  Vitals reviewed.         Assessment & Plan:  Fever chills - Plan: Influenza A and  B Ag, Immunoassay  Influenza A - Plan: oseltamivir (TAMIFLU) 75 MG capsule  Patient has influenza. Begin Tamiflu 75 mg by mouth twice a day for 5 days. Treat the fever with ibuprofen and/or Tylenol. Push fluids. I recommended the patient temporarily discontinue his blood pressure medication as he appears slightly dehydrated and his blood pressure is slightly low. Resume the medication as soon as he starts to feel better

## 2016-10-11 ENCOUNTER — Other Ambulatory Visit: Payer: Self-pay | Admitting: Cardiology

## 2016-11-11 DIAGNOSIS — L570 Actinic keratosis: Secondary | ICD-10-CM | POA: Diagnosis not present

## 2016-11-11 DIAGNOSIS — L299 Pruritus, unspecified: Secondary | ICD-10-CM | POA: Diagnosis not present

## 2016-11-11 DIAGNOSIS — D229 Melanocytic nevi, unspecified: Secondary | ICD-10-CM | POA: Diagnosis not present

## 2016-11-11 DIAGNOSIS — L111 Transient acantholytic dermatosis [Grover]: Secondary | ICD-10-CM | POA: Diagnosis not present

## 2017-01-13 ENCOUNTER — Other Ambulatory Visit: Payer: Self-pay | Admitting: Family Medicine

## 2017-01-13 DIAGNOSIS — Z0000 Encounter for general adult medical examination without abnormal findings: Secondary | ICD-10-CM

## 2017-01-13 DIAGNOSIS — I10 Essential (primary) hypertension: Secondary | ICD-10-CM

## 2017-01-13 DIAGNOSIS — Z79899 Other long term (current) drug therapy: Secondary | ICD-10-CM

## 2017-01-13 DIAGNOSIS — E7800 Pure hypercholesterolemia, unspecified: Secondary | ICD-10-CM

## 2017-01-13 DIAGNOSIS — R7303 Prediabetes: Secondary | ICD-10-CM

## 2017-01-18 ENCOUNTER — Other Ambulatory Visit: Payer: Medicare Other

## 2017-01-18 DIAGNOSIS — Z0000 Encounter for general adult medical examination without abnormal findings: Secondary | ICD-10-CM

## 2017-01-18 DIAGNOSIS — E7800 Pure hypercholesterolemia, unspecified: Secondary | ICD-10-CM

## 2017-01-18 DIAGNOSIS — R7303 Prediabetes: Secondary | ICD-10-CM

## 2017-01-18 DIAGNOSIS — I10 Essential (primary) hypertension: Secondary | ICD-10-CM

## 2017-01-18 DIAGNOSIS — Z79899 Other long term (current) drug therapy: Secondary | ICD-10-CM | POA: Diagnosis not present

## 2017-01-18 LAB — CBC WITH DIFFERENTIAL/PLATELET
BASOS PCT: 1 %
Basophils Absolute: 59 cells/uL (ref 0–200)
EOS PCT: 4 %
Eosinophils Absolute: 236 cells/uL (ref 15–500)
HCT: 41 % (ref 38.5–50.0)
HEMOGLOBIN: 13.6 g/dL (ref 13.0–17.0)
LYMPHS ABS: 1770 {cells}/uL (ref 850–3900)
Lymphocytes Relative: 30 %
MCH: 30.1 pg (ref 27.0–33.0)
MCHC: 33.2 g/dL (ref 32.0–36.0)
MCV: 90.7 fL (ref 80.0–100.0)
MPV: 8.8 fL (ref 7.5–12.5)
Monocytes Absolute: 354 cells/uL (ref 200–950)
Monocytes Relative: 6 %
NEUTROS ABS: 3481 {cells}/uL (ref 1500–7800)
Neutrophils Relative %: 59 %
Platelets: 133 10*3/uL — ABNORMAL LOW (ref 140–400)
RBC: 4.52 MIL/uL (ref 4.20–5.80)
RDW: 14.2 % (ref 11.0–15.0)
WBC: 5.9 10*3/uL (ref 3.8–10.8)

## 2017-01-19 DIAGNOSIS — R3912 Poor urinary stream: Secondary | ICD-10-CM | POA: Diagnosis not present

## 2017-01-19 DIAGNOSIS — E291 Testicular hypofunction: Secondary | ICD-10-CM | POA: Diagnosis not present

## 2017-01-19 DIAGNOSIS — Z8546 Personal history of malignant neoplasm of prostate: Secondary | ICD-10-CM | POA: Diagnosis not present

## 2017-01-19 DIAGNOSIS — N529 Male erectile dysfunction, unspecified: Secondary | ICD-10-CM | POA: Diagnosis not present

## 2017-01-19 DIAGNOSIS — N35013 Post-traumatic anterior urethral stricture: Secondary | ICD-10-CM | POA: Diagnosis not present

## 2017-01-19 LAB — LIPID PANEL
CHOL/HDL RATIO: 3.3 ratio (ref <5.0)
CHOLESTEROL: 143 mg/dL (ref <200)
HDL: 44 mg/dL (ref >40)
LDL Cholesterol: 65 mg/dL (ref <100)
Triglycerides: 169 mg/dL — ABNORMAL HIGH (ref <150)
VLDL: 34 mg/dL — AB (ref <30)

## 2017-01-19 LAB — HEMOGLOBIN A1C
Hgb A1c MFr Bld: 5.7 % — ABNORMAL HIGH (ref <5.7)
Mean Plasma Glucose: 117 mg/dL

## 2017-01-19 LAB — COMPREHENSIVE METABOLIC PANEL
ALK PHOS: 49 U/L (ref 40–115)
ALT: 15 U/L (ref 9–46)
AST: 15 U/L (ref 10–35)
Albumin: 4.3 g/dL (ref 3.6–5.1)
BILIRUBIN TOTAL: 0.5 mg/dL (ref 0.2–1.2)
BUN: 19 mg/dL (ref 7–25)
CALCIUM: 9.2 mg/dL (ref 8.6–10.3)
CO2: 22 mmol/L (ref 20–31)
CREATININE: 0.95 mg/dL (ref 0.70–1.18)
Chloride: 105 mmol/L (ref 98–110)
GLUCOSE: 113 mg/dL — AB (ref 70–99)
Potassium: 4 mmol/L (ref 3.5–5.3)
SODIUM: 140 mmol/L (ref 135–146)
Total Protein: 6.8 g/dL (ref 6.1–8.1)

## 2017-01-22 ENCOUNTER — Ambulatory Visit (INDEPENDENT_AMBULATORY_CARE_PROVIDER_SITE_OTHER): Payer: Medicare Other | Admitting: Family Medicine

## 2017-01-22 ENCOUNTER — Encounter: Payer: Self-pay | Admitting: Family Medicine

## 2017-01-22 VITALS — BP 126/72 | HR 68 | Temp 98.2°F | Resp 14 | Ht 69.0 in | Wt 189.0 lb

## 2017-01-22 DIAGNOSIS — Z0000 Encounter for general adult medical examination without abnormal findings: Secondary | ICD-10-CM

## 2017-01-22 DIAGNOSIS — R7303 Prediabetes: Secondary | ICD-10-CM | POA: Diagnosis not present

## 2017-01-22 DIAGNOSIS — E7800 Pure hypercholesterolemia, unspecified: Secondary | ICD-10-CM | POA: Diagnosis not present

## 2017-01-22 DIAGNOSIS — I10 Essential (primary) hypertension: Secondary | ICD-10-CM

## 2017-01-22 DIAGNOSIS — Z1211 Encounter for screening for malignant neoplasm of colon: Secondary | ICD-10-CM

## 2017-01-22 NOTE — Progress Notes (Signed)
Subjective:    Patient ID: Antonio Clark, male    DOB: [birth year], 81 y.o.   MRN: [id number]  HPI  Here for CPE.  He has no medical concerns today. Past medical history is significant for prostate cancer. Dr. Rosana Hoes implanted radioactive seeds. He is currently followed by urology.  He has a history of tubular adenomas. Last colonoscopy was in 2013 and is now due again. Most recent lab work is listed below: Appointment on 01/18/2017  Component Date Value Ref Range Status  . WBC 01/18/2017 5.9  3.8 - 10.8 K/uL Final  . RBC 01/18/2017 4.52  4.20 - 5.80 MIL/uL Final  . Hemoglobin 01/18/2017 13.6  13.0 - 17.0 g/dL Final  . HCT 01/18/2017 41.0  38.5 - 50.0 % Final  . MCV 01/18/2017 90.7  80.0 - 100.0 fL Final  . MCH 01/18/2017 30.1  27.0 - 33.0 pg Final  . MCHC 01/18/2017 33.2  32.0 - 36.0 g/dL Final  . RDW 01/18/2017 14.2  11.0 - 15.0 % Final  . Platelets 01/18/2017 133* 140 - 400 K/uL Final  . MPV 01/18/2017 8.8  7.5 - 12.5 fL Final  . Neutro Abs 01/18/2017 3481  1,500 - 7,800 cells/uL Final  . Lymphs Abs 01/18/2017 1770  850 - 3,900 cells/uL Final  . Monocytes Absolute 01/18/2017 354  200 - 950 cells/uL Final  . Eosinophils Absolute 01/18/2017 236  15 - 500 cells/uL Final  . Basophils Absolute 01/18/2017 59  0 - 200 cells/uL Final  . Neutrophils Relative % 01/18/2017 59  % Final  . Lymphocytes Relative 01/18/2017 30  % Final  . Monocytes Relative 01/18/2017 6  % Final  . Eosinophils Relative 01/18/2017 4  % Final  . Basophils Relative 01/18/2017 1  % Final  . Smear Review 01/18/2017 Criteria for review not met   Final  . Sodium 01/18/2017 140  135 - 146 mmol/L Final  . Potassium 01/18/2017 4.0  3.5 - 5.3 mmol/L Final  . Chloride 01/18/2017 105  98 - 110 mmol/L Final  . CO2 01/18/2017 22  20 - 31 mmol/L Final  . Glucose, Bld 01/18/2017 113* 70 - 99 mg/dL Final  . BUN 01/18/2017 19  7 - 25 mg/dL Final  . Creat 01/18/2017 0.95  0.70 - 1.18 mg/dL Final   Comment:   For patients > or =  79 years of age: The upper reference limit for Creatinine is approximately 13% higher for people identified as African-American.     . Total Bilirubin 01/18/2017 0.5  0.2 - 1.2 mg/dL Final  . Alkaline Phosphatase 01/18/2017 49  40 - 115 U/L Final  . AST 01/18/2017 15  10 - 35 U/L Final  . ALT 01/18/2017 15  9 - 46 U/L Final  . Total Protein 01/18/2017 6.8  6.1 - 8.1 g/dL Final  . Albumin 01/18/2017 4.3  3.6 - 5.1 g/dL Final  . Calcium 01/18/2017 9.2  8.6 - 10.3 mg/dL Final  . Hgb A1c MFr Bld 01/18/2017 5.7* <5.7 % Final   Comment:   For someone without known diabetes, a hemoglobin A1c value between 5.7% and 6.4% is consistent with prediabetes and should be confirmed with a follow-up test.   For someone with known diabetes, a value <7% indicates that their diabetes is well controlled. A1c targets should be individualized based on duration of diabetes, age, co-morbid conditions and other considerations.   This assay result is consistent with an increased risk of diabetes.   Currently, no consensus exists  regarding use of hemoglobin A1c for diagnosis of diabetes in children.     . Mean Plasma Glucose 01/18/2017 117  mg/dL Final  . Cholesterol 01/18/2017 143  <200 mg/dL Final  . Triglycerides 01/18/2017 169* <150 mg/dL Final  . HDL 01/18/2017 44  >40 mg/dL Final  . Total CHOL/HDL Ratio 01/18/2017 3.3  <5.0 Ratio Final  . VLDL 01/18/2017 34* <30 mg/dL Final  . LDL Cholesterol 01/18/2017 65  <100 mg/dL Final    Past Medical History:  Diagnosis Date  . Borderline diabetic   . History of echocardiogram 07/ 07/ 2011  . History of lithotripsy 1989  . Hyperlipidemia   . Hypertension   . Prostate CA [facility])    Past Surgical History:  Procedure Laterality Date  . COLONOSCOPY  2002  . INSERTION PROSTATE RADIATION SEED  8 4 2010   per Dr Rosana Hoes  . POLYPECTOMY  2002  . THROAT SURGERY  1980   benign cyst  . URETHRAL STRICTURE DILATATION     also penile implant   Current  Outpatient Prescriptions on File Prior to Visit  Medication Sig Dispense Refill  . albuterol (PROVENTIL HFA;VENTOLIN HFA) 108 (90 Base) MCG/ACT inhaler Inhale 2 puffs into the lungs every 6 (six) hours as needed for wheezing or shortness of breath. 1 Inhaler 0  . aspirin 81 MG tablet Take 81 mg by mouth 2 (two) times daily.     Marland Kitchen losartan-hydrochlorothiazide (HYZAAR) 100-12.5 MG tablet TAKE 1 TABLET BY MOUTH DAILY. 30 tablet 10  . multivitamin (THERAGRAN) per tablet Take 1 tablet by mouth daily.      . Omega-3 Fatty Acids (FISH OIL) 1200 MG CAPS Take 1,200 mg by mouth daily.      Marland Kitchen omeprazole (PRILOSEC) 20 MG capsule Take 1 capsule (20 mg total) by mouth daily. 90 capsule 4  . pravastatin (PRAVACHOL) 40 MG tablet TAKE 1 TABLET (40 MG TOTAL) BY MOUTH EVERY EVENING. 90 tablet 3  . Tamsulosin HCl (FLOMAX) 0.4 MG CAPS Take 0.4 mg by mouth daily.       No current facility-administered medications on file prior to visit.    No Known Allergies Social History   Social History  . Marital status: Single    Spouse name: N/A  . Number of children: N/A  . Years of education: N/A   Occupational History  . retired    Social History Main Topics  . Smoking status: Never Smoker  . Smokeless tobacco: Never Used  . Alcohol use No  . Drug use: No  . Sexual activity: Yes   Other Topics Concern  . Not on file   Social History Narrative  . No narrative on file   Family History  Problem Relation Age of Onset  . Coronary artery disease Brother        3 brothers had CABG  . Arrhythmia Brother   . Diabetes Brother   . Hearing loss Brother   . Hypertension Brother   . Heart disease Brother   . Heart failure Brother   . Hypertension Brother   . Heart disease Father   . Hypertension Father   . Sudden death Father   . Stroke Mother        cerebral hemorrage  . Colon cancer Mother   . Cancer Mother   . Suicidality Sister   . Stroke Sister   . Diabetes Brother   . Hypertension Brother   .  Hyperlipidemia Sister      Review of Systems  All other systems reviewed and are negative.      Objective:   Physical Exam  Constitutional: He is oriented to person, place, and time. He appears well-developed and well-nourished. No distress.  HENT:  Head: Normocephalic and atraumatic.  Right Ear: External ear normal.  Left Ear: External ear normal.  Nose: Nose normal.  Mouth/Throat: Oropharynx is clear and moist. No oropharyngeal exudate.  Eyes: Conjunctivae and EOM are normal. Pupils are equal, round, and reactive to light. Right eye exhibits no discharge. Left eye exhibits no discharge. No scleral icterus.  Neck: Normal range of motion. Neck supple. No JVD present. No tracheal deviation present. No thyromegaly present.  Cardiovascular: Normal rate, regular rhythm, normal heart sounds and intact distal pulses.  Exam reveals no gallop and no friction rub.   No murmur heard. Pulmonary/Chest: Effort normal and breath sounds normal. No stridor. No respiratory distress. He has no wheezes. He has no rales. He exhibits no tenderness.  Abdominal: Soft. Bowel sounds are normal. He exhibits no distension and no mass. There is no tenderness. There is no rebound and no guarding.  Musculoskeletal: Normal range of motion. He exhibits no edema or tenderness.  Lymphadenopathy:    He has no cervical adenopathy.  Neurological: He is alert and oriented to person, place, and time. He has normal reflexes. No cranial nerve deficit. He exhibits normal muscle tone. Coordination normal.  Skin: Skin is warm. No rash noted. He is not diaphoretic. No erythema. No pallor.  Psychiatric: He has a normal mood and affect. His behavior is normal. Judgment and thought content normal.  Vitals reviewed. patient does have small actinic keratoses on his forehead, he sees Dr. [place]ble [place] annually.        Assessment & Plan:  Routine general medical examination at a health care facility  Benign essential HTN  Pure  hypercholesterolemia  Prediabetes  Colon cancer screening - Plan: Ambulatory referral to Gastroenterology  Patient follows up with urology annually. He sees his urologist next week. He is due for colonoscopy. Even though he is almost 81 years old, he is in tremendous physical condition and would like to proceed with a colonoscopy. This will be the last time if no concerning findings. Immunizations are up-to-date. Immunization History  Administered Date(s) Administered  . Influenza,inj,Quad PF,36+ Mos 05/12/2016  . Influenza-Unspecified 05/30/2015  . Pneumococcal Conjugate-13 07/08/2015  . Pneumococcal Polysaccharide-23 01/20/2016  . Tdap 06/11/2011   Lab work is outstanding. Fasting blood sugar is much better than last time. He has been working hard on this and I congratulated him on his efforts and his success.

## 2017-02-19 DIAGNOSIS — C61 Malignant neoplasm of prostate: Secondary | ICD-10-CM | POA: Diagnosis not present

## 2017-02-19 DIAGNOSIS — N200 Calculus of kidney: Secondary | ICD-10-CM | POA: Diagnosis not present

## 2017-02-19 DIAGNOSIS — N529 Male erectile dysfunction, unspecified: Secondary | ICD-10-CM | POA: Diagnosis not present

## 2017-02-19 DIAGNOSIS — N35013 Post-traumatic anterior urethral stricture: Secondary | ICD-10-CM | POA: Diagnosis not present

## 2017-02-22 ENCOUNTER — Encounter: Payer: Self-pay | Admitting: Gastroenterology

## 2017-03-08 ENCOUNTER — Other Ambulatory Visit: Payer: Self-pay

## 2017-04-19 ENCOUNTER — Ambulatory Visit (AMBULATORY_SURGERY_CENTER): Payer: Self-pay | Admitting: *Deleted

## 2017-04-19 VITALS — Ht 69.0 in | Wt 197.0 lb

## 2017-04-19 DIAGNOSIS — Z86010 Personal history of colonic polyps: Secondary | ICD-10-CM

## 2017-04-19 MED ORDER — NA SULFATE-K SULFATE-MG SULF 17.5-3.13-1.6 GM/177ML PO SOLN: ORAL | 0 refills | Status: DC

## 2017-04-19 NOTE — Progress Notes (Signed)
Patient denies any allergies to eggs or soy. Patient denies any problems with anesthesia/sedation. Patient denies any oxygen use at home and does not take any diet/weight loss medications. EMMI education assisgned to patient on colonoscopy, this was explained and instructions given to patient. 

## 2017-04-21 DIAGNOSIS — H35351 Cystoid macular degeneration, right eye: Secondary | ICD-10-CM | POA: Diagnosis not present

## 2017-04-21 DIAGNOSIS — H35372 Puckering of macula, left eye: Secondary | ICD-10-CM | POA: Diagnosis not present

## 2017-04-21 DIAGNOSIS — H2513 Age-related nuclear cataract, bilateral: Secondary | ICD-10-CM | POA: Diagnosis not present

## 2017-04-21 DIAGNOSIS — H5203 Hypermetropia, bilateral: Secondary | ICD-10-CM | POA: Diagnosis not present

## 2017-04-27 DIAGNOSIS — H43822 Vitreomacular adhesion, left eye: Secondary | ICD-10-CM | POA: Diagnosis not present

## 2017-04-27 DIAGNOSIS — H35351 Cystoid macular degeneration, right eye: Secondary | ICD-10-CM | POA: Diagnosis not present

## 2017-05-03 ENCOUNTER — Encounter: Payer: Self-pay | Admitting: Gastroenterology

## 2017-05-03 ENCOUNTER — Ambulatory Visit (AMBULATORY_SURGERY_CENTER): Payer: Medicare Other | Admitting: Gastroenterology

## 2017-05-03 VITALS — BP 132/65 | HR 67 | Temp 97.5°F | Resp 20 | Ht 69.0 in | Wt 197.0 lb

## 2017-05-03 DIAGNOSIS — D123 Benign neoplasm of transverse colon: Secondary | ICD-10-CM

## 2017-05-03 DIAGNOSIS — Z86010 Personal history of colonic polyps: Secondary | ICD-10-CM

## 2017-05-03 DIAGNOSIS — I10 Essential (primary) hypertension: Secondary | ICD-10-CM | POA: Diagnosis not present

## 2017-05-03 DIAGNOSIS — D122 Benign neoplasm of ascending colon: Secondary | ICD-10-CM

## 2017-05-03 MED ORDER — SODIUM CHLORIDE 0.9 % IV SOLN: 500.0000 mL | INTRAVENOUS | Status: DC

## 2017-05-03 NOTE — Progress Notes (Signed)
Called to room to assist during endoscopic procedure.  Patient ID and intended procedure confirmed with present staff. Received instructions for my participation in the procedure from the performing physician.  

## 2017-05-03 NOTE — Progress Notes (Signed)
Report given to PACU, vss 

## 2017-05-03 NOTE — Progress Notes (Signed)
Pt's states no medical or surgical changes since previsit or office visit. maw 

## 2017-05-03 NOTE — Patient Instructions (Signed)
YOU HAD AN ENDOSCOPIC PROCEDURE TODAY AT [facility]:   Refer to the procedure report that was given to you for any specific questions about what was found during the examination.  If the procedure report does not answer your questions, please call your gastroenterologist to clarify.  If you requested that your care partner not be given the details of your procedure findings, then the procedure report has been included in a sealed envelope for you to review at your convenience later.  YOU SHOULD EXPECT: Some feelings of bloating in the abdomen. Passage of more gas than usual.  Walking can help get rid of the air that was put into your GI tract during the procedure and reduce the bloating. If you had a lower endoscopy (such as a colonoscopy or flexible sigmoidoscopy) you may notice spotting of blood in your stool or on the toilet paper. If you underwent a bowel prep for your procedure, you may not have a normal bowel movement for a few days.  Please Note:  You might notice some irritation and congestion in your nose or some drainage.  This is from the oxygen used during your procedure.  There is no need for concern and it should clear up in a day or so.  SYMPTOMS TO REPORT IMMEDIATELY:   Following lower endoscopy (colonoscopy or flexible sigmoidoscopy):  Excessive amounts of blood in the stool  Significant tenderness or worsening of abdominal pains  Swelling of the abdomen that is new, acute  Fever of 100F or higher  For urgent or emergent issues, a gastroenterologist can be reached at any hour by calling [phone].   DIET:  We do recommend a small meal at first, but then you may proceed to your regular diet.  Drink plenty of fluids but you should avoid alcoholic beverages for 24 hours.  ACTIVITY:  You should plan to take it easy for the rest of today and you should NOT DRIVE or use heavy machinery until tomorrow (because of the sedation medicines used during the test).     FOLLOW UP: Our staff will call the number listed on your records the next business day following your procedure to check on you and address any questions or concerns that you may have regarding the information given to you following your procedure. If we do not reach you, we will leave a message.  However, if you are feeling well and you are not experiencing any problems, there is no need to return our call.  We will assume that you have returned to your regular daily activities without incident.  If any biopsies were taken you will be contacted by phone or by letter within the next 1-3 weeks.  Please call us at [phone] if you have not heard about the biopsies in 3 weeks.   No repeat colonoscopy due to age  Await for biopsy results Polyps (handout given) Hemorrhoids (handout given) Diverticulosis (handout given)   SIGNATURES/CONFIDENTIALITY: You and/or your care partner have signed paperwork which will be entered into your electronic medical record.  These signatures attest to the fact that that the information above on your After Visit Summary has been reviewed and is understood.  Full responsibility of the confidentiality of this discharge information lies with you and/or your care-partner.

## 2017-05-03 NOTE — Op Note (Signed)
[facility] Patient Name: Antonio Clark Procedure Date: 05/03/2017 9:12 AM MRN: [id number] Endoscopist: Mauri Pole , MD Age: 81 Referring MD:  Date of Birth: 11-Jan-1936 Gender: Male Account #: [id number] Procedure:                Colonoscopy Indications:              High risk colon cancer surveillance: Personal                            history of colonic polyps Medicines:                Monitored Anesthesia Care Procedure:                Pre-Anesthesia Assessment:                           - Prior to the procedure, a History and Physical                            was performed, and patient medications and                            allergies were reviewed. The patient's tolerance of                            previous anesthesia was also reviewed. The risks                            and benefits of the procedure and the sedation                            options and risks were discussed with the patient.                            All questions were answered, and informed consent                            was obtained. Prior Anticoagulants: The patient has                            taken no previous anticoagulant or antiplatelet                            agents. ASA Grade Assessment: II - A patient with                            mild systemic disease. After reviewing the risks                            and benefits, the patient was deemed in                            satisfactory condition to undergo the procedure.  After obtaining informed consent, the colonoscope                            was passed under direct vision. Throughout the                            procedure, the patient's blood pressure, pulse, and                            oxygen saturations were monitored continuously. The                            Colonoscope was introduced through the anus and                            advanced to the the cecum, identified  by                            appendiceal orifice and ileocecal valve. The                            colonoscopy was performed without difficulty. The                            patient tolerated the procedure well. The quality                            of the bowel preparation was excellent. The                            ileocecal valve, appendiceal orifice, and rectum                            were photographed. Scope In: 9:17:36 AM Scope Out: 9:32:12 AM Scope Withdrawal Time: 0 hours 11 minutes 8 seconds  Total Procedure Duration: 0 hours 14 minutes 36 seconds  Findings:                 The perianal and digital rectal examinations were                            normal.                           Two sessile polyps were found in the transverse                            colon and ascending colon. The polyps were 5 to 7                            mm in size. These polyps were removed with a cold                            snare. Resection and retrieval were complete.  Scattered small-mouthed diverticula were found in                            the sigmoid colon, descending colon and ascending                            colon.                           Non-bleeding internal hemorrhoids were found during                            retroflexion. The hemorrhoids were small.                           The exam was otherwise without abnormality. Complications:            No immediate complications. Estimated Blood Loss:     Estimated blood loss was minimal. Impression:               - Two 5 to 7 mm polyps in the transverse colon and                            in the ascending colon, removed with a cold snare.                            Resected and retrieved.                           - Diverticulosis in the sigmoid colon, in the                            descending colon and in the ascending colon.                           - Non-bleeding internal hemorrhoids.                            - The examination was otherwise normal. Recommendation:           - Patient has a contact number available for                            emergencies. The signs and symptoms of potential                            delayed complications were discussed with the                            patient. Return to normal activities tomorrow.                            Written discharge instructions were provided to the                            patient.                           -  Resume previous diet.                           - Continue present medications.                           - Await pathology results.                           - No repeat colonoscopy due to age. Mauri Pole, MD 05/03/2017 9:38:38 AM This report has been signed electronically.

## 2017-05-04 ENCOUNTER — Telehealth: Payer: Self-pay

## 2017-05-04 NOTE — Telephone Encounter (Signed)
  Follow up Call-  Call Averiana Clouatre number 05/03/2017  Post procedure Call Imogean Ciampa phone  # [phone] cell  Permission to leave phone message Yes  Some recent data might be hidden     Patient questions:  Do you have a fever, pain , or abdominal swelling? No. Pain Score  0 *  Have you tolerated food without any problems? Yes.    Have you been able to return to your normal activities? Yes.    Do you have any questions about your discharge instructions: Diet   No. Medications  No. Follow up visit  No.  Do you have questions or concerns about your Care? No.  Actions: * If pain score is 4 or above: No action needed, pain <4.

## 2017-05-06 DIAGNOSIS — H43822 Vitreomacular adhesion, left eye: Secondary | ICD-10-CM | POA: Diagnosis not present

## 2017-05-06 DIAGNOSIS — H35351 Cystoid macular degeneration, right eye: Secondary | ICD-10-CM | POA: Diagnosis not present

## 2017-05-10 ENCOUNTER — Encounter: Payer: Self-pay | Admitting: Gastroenterology

## 2017-06-03 DIAGNOSIS — H35071 Retinal telangiectasis, right eye: Secondary | ICD-10-CM | POA: Diagnosis not present

## 2017-06-03 DIAGNOSIS — E119 Type 2 diabetes mellitus without complications: Secondary | ICD-10-CM | POA: Diagnosis not present

## 2017-06-03 DIAGNOSIS — H43822 Vitreomacular adhesion, left eye: Secondary | ICD-10-CM | POA: Diagnosis not present

## 2017-07-09 ENCOUNTER — Ambulatory Visit (INDEPENDENT_AMBULATORY_CARE_PROVIDER_SITE_OTHER): Payer: Medicare Other

## 2017-07-09 ENCOUNTER — Ambulatory Visit: Payer: Self-pay

## 2017-07-09 DIAGNOSIS — Z23 Encounter for immunization: Secondary | ICD-10-CM

## 2017-07-09 NOTE — Progress Notes (Signed)
Patient was seen in office for 1st dose of shingrix vaccine.Patient received the vaccine in his left deltoid.Patient tolerated well.

## 2017-07-27 DIAGNOSIS — E119 Type 2 diabetes mellitus without complications: Secondary | ICD-10-CM | POA: Diagnosis not present

## 2017-07-27 DIAGNOSIS — H43822 Vitreomacular adhesion, left eye: Secondary | ICD-10-CM | POA: Diagnosis not present

## 2017-07-27 DIAGNOSIS — H35071 Retinal telangiectasis, right eye: Secondary | ICD-10-CM | POA: Diagnosis not present

## 2017-07-29 DIAGNOSIS — N99114 Postprocedural urethral stricture, male, unspecified: Secondary | ICD-10-CM | POA: Diagnosis not present

## 2017-07-29 DIAGNOSIS — E291 Testicular hypofunction: Secondary | ICD-10-CM | POA: Diagnosis not present

## 2017-07-29 DIAGNOSIS — N529 Male erectile dysfunction, unspecified: Secondary | ICD-10-CM | POA: Diagnosis not present

## 2017-07-29 DIAGNOSIS — Z8546 Personal history of malignant neoplasm of prostate: Secondary | ICD-10-CM | POA: Diagnosis not present

## 2017-07-29 DIAGNOSIS — R3912 Poor urinary stream: Secondary | ICD-10-CM | POA: Diagnosis not present

## 2017-08-23 DIAGNOSIS — C61 Malignant neoplasm of prostate: Secondary | ICD-10-CM | POA: Diagnosis not present

## 2017-08-23 DIAGNOSIS — Z8546 Personal history of malignant neoplasm of prostate: Secondary | ICD-10-CM | POA: Diagnosis not present

## 2017-08-23 DIAGNOSIS — E291 Testicular hypofunction: Secondary | ICD-10-CM | POA: Diagnosis not present

## 2017-08-23 DIAGNOSIS — N528 Other male erectile dysfunction: Secondary | ICD-10-CM | POA: Diagnosis not present

## 2017-08-23 DIAGNOSIS — N200 Calculus of kidney: Secondary | ICD-10-CM | POA: Diagnosis not present

## 2017-08-23 DIAGNOSIS — N99114 Postprocedural urethral stricture, male, unspecified: Secondary | ICD-10-CM | POA: Diagnosis not present

## 2017-09-01 ENCOUNTER — Other Ambulatory Visit: Payer: Self-pay | Admitting: Cardiology

## 2017-09-01 DIAGNOSIS — M9902 Segmental and somatic dysfunction of thoracic region: Secondary | ICD-10-CM | POA: Diagnosis not present

## 2017-09-01 DIAGNOSIS — M5136 Other intervertebral disc degeneration, lumbar region: Secondary | ICD-10-CM | POA: Diagnosis not present

## 2017-09-01 DIAGNOSIS — M9903 Segmental and somatic dysfunction of lumbar region: Secondary | ICD-10-CM | POA: Diagnosis not present

## 2017-09-01 DIAGNOSIS — M62830 Muscle spasm of back: Secondary | ICD-10-CM | POA: Diagnosis not present

## 2017-09-02 ENCOUNTER — Other Ambulatory Visit: Payer: Self-pay | Admitting: Cardiology

## 2017-09-07 DIAGNOSIS — M9903 Segmental and somatic dysfunction of lumbar region: Secondary | ICD-10-CM | POA: Diagnosis not present

## 2017-09-07 DIAGNOSIS — M9902 Segmental and somatic dysfunction of thoracic region: Secondary | ICD-10-CM | POA: Diagnosis not present

## 2017-09-07 DIAGNOSIS — M62830 Muscle spasm of back: Secondary | ICD-10-CM | POA: Diagnosis not present

## 2017-09-07 DIAGNOSIS — M5136 Other intervertebral disc degeneration, lumbar region: Secondary | ICD-10-CM | POA: Diagnosis not present

## 2017-09-09 ENCOUNTER — Encounter: Payer: Self-pay | Admitting: Cardiology

## 2017-09-09 ENCOUNTER — Encounter (INDEPENDENT_AMBULATORY_CARE_PROVIDER_SITE_OTHER): Payer: Self-pay

## 2017-09-09 ENCOUNTER — Ambulatory Visit (INDEPENDENT_AMBULATORY_CARE_PROVIDER_SITE_OTHER): Payer: Medicare Other | Admitting: Cardiology

## 2017-09-09 VITALS — BP 144/78 | HR 75 | Ht 69.0 in | Wt 202.0 lb

## 2017-09-09 DIAGNOSIS — E7800 Pure hypercholesterolemia, unspecified: Secondary | ICD-10-CM | POA: Diagnosis not present

## 2017-09-09 DIAGNOSIS — I10 Essential (primary) hypertension: Secondary | ICD-10-CM

## 2017-09-09 DIAGNOSIS — Z8249 Family history of ischemic heart disease and other diseases of the circulatory system: Secondary | ICD-10-CM | POA: Diagnosis not present

## 2017-09-09 NOTE — Progress Notes (Signed)
Cardiology Office Note    Date:  09/09/2017   ID:  [name], DOB 1936-08-09, MRN [id number]  PCP:  Susy Frizzle, MD  Cardiologist:   Candee Furbish, MD     History of Present Illness:  Antonio Clark is a 82 y.o. male with history of diabetes or hypertension, hyperlipidemia seen previously by Dr. Aundra Dubin on 07/02/15 here for follow-up. Antonio Clark.   Strong family history of CAD. 3 family members have bypass. He also has another sister that is in her 71s. Tolerating pravastatin.   Brother died age 88 CAD.  Cardiolite 2011-11-27 atypical chest pain-normal. EKG with normal sinus rhythm, PVCs.  Blood sugar has been elevated recently. Blood pressure at times can be elevated as well. Compliant with medications.  Walks at the [facility] most days to 3 miles.  Retired from [employer] and [employer]. Has been taking aspirin 81 mg for prevention.  Enjoys going on [employer] trips. [place].  Past Medical History:  Diagnosis Date  . Borderline diabetic   . History of echocardiogram 07/ 07/ 11/26/2009  . History of lithotripsy 11/27/1987  . Hyperlipidemia   . Hypertension   . Prostate CA ([facility]) 11/26/2008    Past Surgical History:  Procedure Laterality Date  . COLONOSCOPY  26-Nov-2000  . INSERTION PROSTATE RADIATION SEED  8 4 2010   per Dr Rosana Hoes  . POLYPECTOMY  11/26/2000  . THROAT SURGERY  1980   benign cyst  . URETHRAL STRICTURE DILATATION     also penile implant    Current Medications: Outpatient Medications Prior to Visit  Medication Sig Dispense Refill  . aspirin 81 MG tablet Take 81 mg by mouth 2 (two) times daily.     Marland Kitchen losartan-hydrochlorothiazide (HYZAAR) 100-12.5 MG tablet Take 1 tablet by mouth daily. Please keep upcoming appt for future refills. Thank you 30 tablet 0  . multivitamin (THERAGRAN) per tablet Take 1 tablet by mouth daily.      . Omega-3 Fatty Acids (FISH OIL) 1200 MG CAPS Take 1,200 mg by mouth daily.      Marland Kitchen omeprazole (PRILOSEC) 20 MG capsule Take 1 capsule (20 mg total) by mouth  daily. 90 capsule 4  . pravastatin (PRAVACHOL) 40 MG tablet TAKE 1 TABLET (40 MG TOTAL) BY MOUTH EVERY EVENING. 30 tablet 0  . Tamsulosin HCl (FLOMAX) 0.4 MG CAPS Take 0.4 mg by mouth daily.      Marland Kitchen albuterol (PROVENTIL HFA;VENTOLIN HFA) 108 (90 Base) MCG/ACT inhaler Inhale 2 puffs into the lungs every 6 (six) hours as needed for wheezing or shortness of breath. (Patient not taking: Reported on 04/19/2017) 1 Inhaler 0   No facility-administered medications prior to visit.      Allergies:   Patient has no known allergies.   Social History   Socioeconomic History  . Marital status: Single    Spouse name: None  . Number of children: None  . Years of education: None  . Highest education level: None  Social Needs  . Financial resource strain: None  . Food insecurity - worry: None  . Food insecurity - inability: None  . Transportation needs - medical: None  . Transportation needs - non-medical: None  Occupational History  . Occupation: retired  Tobacco Use  . Smoking status: Never Smoker  . Smokeless tobacco: Never Used  Substance and Sexual Activity  . Alcohol use: No  . Drug use: No  . Sexual activity: Yes  Other Topics Concern  . None  Social History Narrative  .  None     Family History:  The patient's family history includes Arrhythmia in his brother; Clark in his mother; Colon Clark (age of onset: 33) in his mother; Coronary artery disease in his brother; Diabetes in his brother and brother; Hearing loss in his brother; Heart disease in his brother and father; Heart failure in his brother; Hyperlipidemia in his sister; Hypertension in his brother, brother, brother, and father; Stroke in his mother and sister; Sudden death in his father; Suicidality in his sister.   ROS:   Please see the history of present illness.    Review of Systems  All other systems reviewed and are negative.     PHYSICAL EXAM:   VS:  BP (!) 144/78   Pulse 75   Ht 5\' 9"  (1.753 m)   Wt 202 lb  (91.6 kg)   BMI 29.83 kg/m    GEN: Well nourished, well developed, in no acute distress  HEENT: normal  Neck: no JVD, carotid bruits, or masses Cardiac: RRR; no murmurs, rubs, or gallops,no edema  Respiratory:  clear to auscultation bilaterally, normal work of breathing GI: soft, nontender, nondistended, + BS MS: no deformity or atrophy  Skin: warm and dry, no rash Neuro:  Alert and Oriented x 3, Strength and sensation are intact Psych: euthymic mood, full affect   Wt Readings from Last 3 Encounters:  09/09/17 202 lb (91.6 kg)  05/03/17 197 lb (89.4 kg)  04/19/17 197 lb (89.4 kg)      Studies/Labs Reviewed:   EKG:  EKG is ordered today.  The ekg ordered today demonstrates 09/09/17 - NSR personally viewed. 09/09/16-sinus rhythm 64 with no other abnormalities. Personally viewed.  Recent Labs: 01/18/2017: ALT 15; BUN 19; Creat 0.95; Hemoglobin 13.6; Platelets 133; Potassium 4.0; Sodium 140   Lipid Panel    Component Value Date/Time   CHOL 143 01/18/2017 0813   TRIG 169 (H) 01/18/2017 0813   HDL 44 01/18/2017 0813   CHOLHDL 3.3 01/18/2017 0813   VLDL 34 (H) 01/18/2017 0813   LDLCALC 65 01/18/2017 0813    Additional studies/ records that were reviewed today include:  Prior medical records reviewed, lab work reviewed. EKG reviewed.    ASSESSMENT:    1. Essential hypertension   2. Pure hypercholesterolemia      PLAN:  In order of problems listed above:  Family history of CAD  - Continue with primary prevention efforts. Aspirin given increased risk. Takes 81 BID. Brother 22 MI  Essential hypertension  - Medications reviewed. Stable.   Hyperlipidemia  - Pravastatin. Doing well. No side effects.   - LDL 65  Elevated glucose  - Strongly encouraged weight loss. This will help not only with his glucose but also with his blood pressure. Discussed again.   Enjoy mission trip.   One year f/u    Medication Adjustments/Labs and Tests Ordered: Current medicines  are reviewed at length with the patient today.  Concerns regarding medicines are outlined above.  Medication changes, Labs and Tests ordered today are listed in the Patient Instructions below. Patient Instructions  Medication Instructions:  The current medical regimen is effective;  continue present plan and medications.  Follow-Up: Follow up in 1 year with Dr. Marlou Porch.  You will receive a letter in the mail 2 months before you are due.  Please call us when you receive this letter to schedule your follow up appointment.  If you need a refill on your cardiac medications before your next appointment, please call your pharmacy.  Thank you for choosing [facility]!!        Signed, Candee Furbish, MD  09/09/2017 11:40 AM    [address] [place], [place],   [address] Phone: [phone]; Fax: [phone]

## 2017-09-09 NOTE — Patient Instructions (Signed)

## 2017-09-10 ENCOUNTER — Ambulatory Visit: Payer: Medicare Other

## 2017-09-30 ENCOUNTER — Ambulatory Visit (INDEPENDENT_AMBULATORY_CARE_PROVIDER_SITE_OTHER): Payer: Medicare Other | Admitting: *Deleted

## 2017-09-30 ENCOUNTER — Other Ambulatory Visit: Payer: Self-pay

## 2017-09-30 DIAGNOSIS — Z23 Encounter for immunization: Secondary | ICD-10-CM

## 2017-10-04 ENCOUNTER — Other Ambulatory Visit: Payer: Self-pay | Admitting: Cardiology

## 2017-10-04 DIAGNOSIS — M62830 Muscle spasm of back: Secondary | ICD-10-CM | POA: Diagnosis not present

## 2017-10-04 DIAGNOSIS — M5136 Other intervertebral disc degeneration, lumbar region: Secondary | ICD-10-CM | POA: Diagnosis not present

## 2017-10-04 DIAGNOSIS — M9902 Segmental and somatic dysfunction of thoracic region: Secondary | ICD-10-CM | POA: Diagnosis not present

## 2017-10-04 DIAGNOSIS — M9903 Segmental and somatic dysfunction of lumbar region: Secondary | ICD-10-CM | POA: Diagnosis not present

## 2017-10-13 DIAGNOSIS — H25813 Combined forms of age-related cataract, bilateral: Secondary | ICD-10-CM | POA: Diagnosis not present

## 2017-10-13 DIAGNOSIS — H35372 Puckering of macula, left eye: Secondary | ICD-10-CM | POA: Diagnosis not present

## 2017-10-13 DIAGNOSIS — H35351 Cystoid macular degeneration, right eye: Secondary | ICD-10-CM | POA: Diagnosis not present

## 2017-10-17 ENCOUNTER — Other Ambulatory Visit: Payer: Self-pay | Admitting: Cardiology

## 2017-10-20 DIAGNOSIS — M9902 Segmental and somatic dysfunction of thoracic region: Secondary | ICD-10-CM | POA: Diagnosis not present

## 2017-10-20 DIAGNOSIS — M62830 Muscle spasm of back: Secondary | ICD-10-CM | POA: Diagnosis not present

## 2017-10-20 DIAGNOSIS — M9903 Segmental and somatic dysfunction of lumbar region: Secondary | ICD-10-CM | POA: Diagnosis not present

## 2017-10-20 DIAGNOSIS — M5136 Other intervertebral disc degeneration, lumbar region: Secondary | ICD-10-CM | POA: Diagnosis not present

## 2017-10-25 DIAGNOSIS — M9903 Segmental and somatic dysfunction of lumbar region: Secondary | ICD-10-CM | POA: Diagnosis not present

## 2017-10-25 DIAGNOSIS — M9902 Segmental and somatic dysfunction of thoracic region: Secondary | ICD-10-CM | POA: Diagnosis not present

## 2017-10-25 DIAGNOSIS — M62830 Muscle spasm of back: Secondary | ICD-10-CM | POA: Diagnosis not present

## 2017-10-25 DIAGNOSIS — M5136 Other intervertebral disc degeneration, lumbar region: Secondary | ICD-10-CM | POA: Diagnosis not present

## 2017-10-29 ENCOUNTER — Encounter: Payer: Self-pay | Admitting: Internal Medicine

## 2017-10-29 ENCOUNTER — Ambulatory Visit (INDEPENDENT_AMBULATORY_CARE_PROVIDER_SITE_OTHER): Payer: Medicare Other | Admitting: Internal Medicine

## 2017-10-29 ENCOUNTER — Telehealth: Payer: Self-pay | Admitting: Cardiology

## 2017-10-29 VITALS — BP 126/90 | HR 149 | Ht 69.0 in | Wt 202.0 lb

## 2017-10-29 DIAGNOSIS — I4891 Unspecified atrial fibrillation: Secondary | ICD-10-CM

## 2017-10-29 DIAGNOSIS — E291 Testicular hypofunction: Secondary | ICD-10-CM | POA: Diagnosis not present

## 2017-10-29 DIAGNOSIS — N99114 Postprocedural urethral stricture, male, unspecified: Secondary | ICD-10-CM | POA: Diagnosis not present

## 2017-10-29 DIAGNOSIS — R000 Tachycardia, unspecified: Secondary | ICD-10-CM | POA: Diagnosis not present

## 2017-10-29 DIAGNOSIS — R3912 Poor urinary stream: Secondary | ICD-10-CM | POA: Diagnosis not present

## 2017-10-29 DIAGNOSIS — Z8546 Personal history of malignant neoplasm of prostate: Secondary | ICD-10-CM | POA: Diagnosis not present

## 2017-10-29 DIAGNOSIS — N529 Male erectile dysfunction, unspecified: Secondary | ICD-10-CM | POA: Diagnosis not present

## 2017-10-29 LAB — CBC WITH DIFFERENTIAL/PLATELET
BASOS: 0 %
Basophils Absolute: 0 10*3/uL (ref 0.0–0.2)
EOS (ABSOLUTE): 0.1 10*3/uL (ref 0.0–0.4)
EOS: 2 %
HEMATOCRIT: 42.7 % (ref 37.5–51.0)
HEMOGLOBIN: 14.6 g/dL (ref 13.0–17.7)
IMMATURE GRANULOCYTES: 0 %
Immature Grans (Abs): 0 10*3/uL (ref 0.0–0.1)
LYMPHS ABS: 1.6 10*3/uL (ref 0.7–3.1)
Lymphs: 25 %
MCH: 30.3 pg (ref 26.6–33.0)
MCHC: 34.2 g/dL (ref 31.5–35.7)
MCV: 89 fL (ref 79–97)
MONOCYTES: 9 %
Monocytes Absolute: 0.6 10*3/uL (ref 0.1–0.9)
NEUTROS PCT: 64 %
Neutrophils Absolute: 4.1 10*3/uL (ref 1.4–7.0)
Platelets: 144 10*3/uL — ABNORMAL LOW (ref 150–379)
RBC: 4.82 x10E6/uL (ref 4.14–5.80)
RDW: 14.2 % (ref 12.3–15.4)
WBC: 6.5 10*3/uL (ref 3.4–10.8)

## 2017-10-29 LAB — BASIC METABOLIC PANEL
BUN / CREAT RATIO: 19 (ref 10–24)
BUN: 19 mg/dL (ref 8–27)
CO2: 20 mmol/L (ref 20–29)
CREATININE: 1.02 mg/dL (ref 0.76–1.27)
Calcium: 10 mg/dL (ref 8.6–10.2)
Chloride: 103 mmol/L (ref 96–106)
GFR calc Af Amer: 80 mL/min/{1.73_m2} (ref >59)
GFR, EST NON AFRICAN AMERICAN: 70 mL/min/{1.73_m2} (ref >59)
Glucose: 112 mg/dL — ABNORMAL HIGH (ref 65–99)
Potassium: 4.4 mmol/L (ref 3.5–5.2)
SODIUM: 140 mmol/L (ref 134–144)

## 2017-10-29 NOTE — Patient Instructions (Addendum)
Medication Instructions:  Your physician has recommended you make the following change in your medication:   1. STOP Aspirin 2. Begin taking Eliquis (apixiban), 5mg  tablet, two times daily  Labwork:  You will have a CBC and BMP drawn today  Testing/Procedures: Your physician has requested that you have an echocardiogram. Echocardiography is a painless test that uses sound waves to create images of your heart. It provides your doctor with information about the size and shape of your heart and how well your heart's chambers and valves are working. This procedure takes approximately one hour. There are no restrictions for this procedure.  Your physician has recommended that you have a sleep study. This test records several body functions during sleep, including: brain activity, eye movement, oxygen and carbon dioxide blood levels, heart rate and rhythm, breathing rate and rhythm, the flow of air through your mouth and nose, snoring, body muscle movements, and chest and belly movement.   Follow-Up: Your physician recommends that you schedule a follow-up appointment in:   Follow up with Dr Marlou Porch within the month  Any Other Special Instructions Will Be Listed Below (If Applicable).     If you need a refill on your cardiac medications before your next appointment, please call your pharmacy.  Eliquis (Apixaban) oral tablets What is this medicine? APIXABAN (a PIX a ban) is an anticoagulant (blood thinner). It is used to lower the chance of stroke in people with a medical condition called atrial fibrillation. It is also used to treat or prevent blood clots in the lungs or in the veins. This medicine may be used for other purposes; ask your health care provider or pharmacist if you have questions. COMMON BRAND NAME(S): Eliquis What should I tell my health care provider before I take this medicine? They need to know if you have any of these conditions: -bleeding disorders -bleeding in the  brain -blood in your stools (black or tarry stools) or if you have blood in your vomit -history of stomach bleeding -kidney disease -liver disease -mechanical heart valve -an unusual or allergic reaction to apixaban, other medicines, foods, dyes, or preservatives -pregnant or trying to get pregnant -breast-feeding How should I use this medicine? Take this medicine by mouth with a glass of water. Follow the directions on the prescription label. You can take it with or without food. If it upsets your stomach, take it with food. Take your medicine at regular intervals. Do not take it more often than directed. Do not stop taking except on your doctor's advice. Stopping this medicine may increase your risk of a blot clot. Be sure to refill your prescription before you run out of medicine. Talk to your pediatrician regarding the use of this medicine in children. Special care may be needed. Overdosage: If you think you have taken too much of this medicine contact a poison control center or emergency room at once. NOTE: This medicine is only for you. Do not share this medicine with others. What if I miss a dose? If you miss a dose, take it as soon as you can. If it is almost time for your next dose, take only that dose. Do not take double or extra doses. What may interact with this medicine? This medicine may interact with the following: -aspirin and aspirin-like medicines -certain medicines for fungal infections like ketoconazole and itraconazole -certain medicines for seizures like carbamazepine and phenytoin -certain medicines that treat or prevent blood clots like warfarin, enoxaparin, and dalteparin -clarithromycin -NSAIDs, medicines for pain  and inflammation, like ibuprofen or naproxen -rifampin -ritonavir -St. John's wort This list may not describe all possible interactions. Give your health care provider a list of all the medicines, herbs, non-prescription drugs, or dietary supplements you  use. Also tell them if you smoke, drink alcohol, or use illegal drugs. Some items may interact with your medicine. What should I watch for while using this medicine? Visit your doctor or health care professional for regular checks on your progress. Notify your doctor or health care professional and seek emergency treatment if you develop breathing problems; changes in vision; chest pain; severe, sudden headache; pain, swelling, warmth in the leg; trouble speaking; sudden numbness or weakness of the face, arm or leg. These can be signs that your condition has gotten worse. If you are going to have surgery or other procedure, tell your doctor that you are taking this medicine. What side effects may I notice from receiving this medicine? Side effects that you should report to your doctor or health care professional as soon as possible: -allergic reactions like skin rash, itching or hives, swelling of the face, lips, or tongue -signs and symptoms of bleeding such as bloody or black, tarry stools; red or dark-brown urine; spitting up blood or brown material that looks like coffee grounds; red spots on the skin; unusual bruising or bleeding from the eye, gums, or nose This list may not describe all possible side effects. Call your doctor for medical advice about side effects. You may report side effects to FDA at 1-800-FDA-1088. Where should I keep my medicine? Keep out of the reach of children. Store at room temperature between 20 and 25 degrees C (68 and 77 degrees F). Throw away any unused medicine after the expiration date. NOTE: This sheet is a summary. It may not cover all possible information. If you have questions about this medicine, talk to your doctor, pharmacist, or health care provider.  2018 Elsevier/Gold Standard (2016-02-17 11:54:23)

## 2017-10-29 NOTE — Progress Notes (Signed)
Patient Care Team: Susy Frizzle, MD as PCP - General (Family Medicine)   HPI  Antonio Clark is a 82 y.o. male Seen today as an add-on from the urology office where he presented with tachycardia.  He had noted tachycardia this morning when he took a shower and appreciated significant fatigue.  His blood pressure machine said that his blood systolic was in the 42V and his heart rate was in the 150s.  He was able to proceed to his urology appointment where they confirmed the abnormal vital signs called our office and referred him here.  This is his first known episode.  He denies prior neurological events.  He is fit.  He exercises regularly.  No abnormal bleeding history on aspirin.  Records and Results Reviewed    Thromboembolic risk factors ( age  -2, HTN-1, DM-[borderline]) for a CHADSVASc Score of 3   Myoview 2013 neg no echo   Past Medical History:  Diagnosis Date  . Borderline diabetic   . History of echocardiogram 07/ 07/ 2011  . History of lithotripsy 1989  . Hyperlipidemia   . Hypertension   . Prostate CA ([place]) 2010    Past Surgical History:  Procedure Laterality Date  . COLONOSCOPY  2002  . INSERTION PROSTATE RADIATION SEED  8 4 2010   per Dr Rosana Hoes  . POLYPECTOMY  2002  . THROAT SURGERY  1980   benign cyst  . URETHRAL STRICTURE DILATATION     also penile implant    Current Meds  Medication Sig  . aspirin 81 MG tablet Take 81 mg by mouth 2 (two) times daily.   Marland Kitchen losartan-hydrochlorothiazide (HYZAAR) 100-12.5 MG tablet Take 1 tablet by mouth daily.  . multivitamin (THERAGRAN) per tablet Take 1 tablet by mouth daily.    . Omega-3 Fatty Acids (FISH OIL) 1200 MG CAPS Take 1,200 mg by mouth daily.    Marland Kitchen omeprazole (PRILOSEC) 20 MG capsule Take 1 capsule (20 mg total) by mouth daily.  . pravastatin (PRAVACHOL) 40 MG tablet TAKE 1 TABLET BY MOUTH EVERY DAY IN THE EVENING  . Tamsulosin HCl (FLOMAX) 0.4 MG CAPS Take 0.4 mg by mouth daily.      No  Known Allergies    Review of Systems negative except from HPI and PMH  Physical Exam BP 126/90   Pulse (!) 149   Ht 5\' 9"  (1.753 m)   Wt 202 lb (91.6 kg)   SpO2 98%   BMI 29.83 kg/m  Well developed and well nourished in no acute distress HENT normal E scleral and icterus clear Neck Supple JVP flat; carotids brisk and full Clear to ausculation Regular rate and rhythm, no murmurs gallops or rub Soft with active bowel sounds No clubbing cyanosis  Edema Alert and oriented, grossly normal motor and sensory function Skin Warm and Dry  ECG demonstrates first atrial flutter with 2-1 conduction and variable conduction through the right bundle resulting in a bigeminal pattern ECG #2 demonstrates sinus rhythm at 98 with frequent PVCs.  Intervals 18/12/36 with a left axis of -64 PVCs had a left bundle branch block pattern  Assessment and  Plan  Atrial flutter-paroxysmal  Hypertension  Diabetes  Sleep disordered breathing and daytime somnolence   The patient has paroxysmal atrial flutter.  He converted spontaneously.  He was moderately symptomatic with some lightheadedness hypotension and fatigue.  His thromboembolic risk profile suggests anticoagulation is in order.  The issue as to whether this episode is  more like SCAF or clinical atrial fibrillation is not so clear to me with the brevity of the event.  However, following discussions related to risks of anticoagulation versus aspirin and thromboembolic risk being somewhere between a SCAF risk and a clinical risk i.e. 2-4%, the patient is interested in undertaking anticoagulation.  We reviewed major bleeding risks.  He has sleep disordered breathing and daytime somnolence.  Modification of atrial fibrillation risks merits than a sleep study.  We will need  echocardiogram to look at LV function and LA structure   More than 50% of 40 min was spent in counseling related to the above Current medicines are reviewed at length with  the patient today .  The patient does not  have concerns regarding medicines.

## 2017-11-01 DIAGNOSIS — M5136 Other intervertebral disc degeneration, lumbar region: Secondary | ICD-10-CM | POA: Diagnosis not present

## 2017-11-01 DIAGNOSIS — M9905 Segmental and somatic dysfunction of pelvic region: Secondary | ICD-10-CM | POA: Diagnosis not present

## 2017-11-01 DIAGNOSIS — M5416 Radiculopathy, lumbar region: Secondary | ICD-10-CM | POA: Diagnosis not present

## 2017-11-01 DIAGNOSIS — M9903 Segmental and somatic dysfunction of lumbar region: Secondary | ICD-10-CM | POA: Diagnosis not present

## 2017-11-02 ENCOUNTER — Telehealth: Payer: Self-pay | Admitting: Internal Medicine

## 2017-11-02 ENCOUNTER — Telehealth: Payer: Self-pay | Admitting: *Deleted

## 2017-11-02 MED ORDER — APIXABAN 5 MG PO TABS: 5.0000 mg | ORAL_TABLET | Freq: Two times a day (BID) | ORAL | 11 refills | Status: DC

## 2017-11-02 NOTE — Addendum Note (Signed)
Addended by: Dollene Primrose on: 11/02/2017 12:55 PM   Modules accepted: Orders

## 2017-11-02 NOTE — Telephone Encounter (Signed)
Sent to pre cert/ sleep pool 

## 2017-11-02 NOTE — Telephone Encounter (Signed)
Antonio Clark ( [place] ) is calling to get clarification on Eliquis 5 mg ( the date it was written) the date says 11/27/17. Please call to clarify . Thanks

## 2017-11-02 NOTE — Telephone Encounter (Signed)
Spoke with Judson Roch, pharmacist, pt had a hand written prescription for Eliquis 5mg  bid. I sent in 30 days prescription with 11 refills so pt can use his discount card given to him with his samples. No additional requests.

## 2017-11-02 NOTE — Telephone Encounter (Signed)
-----   Message from Dollene Primrose, RN sent at 10/29/2017 12:21 PM EDT ----- Hello,  We will need a sleep study scheduled for this patient.   Thank you :)  [place]

## 2017-11-05 ENCOUNTER — Ambulatory Visit (HOSPITAL_COMMUNITY): Payer: Medicare Other | Attending: Cardiovascular Disease

## 2017-11-05 ENCOUNTER — Other Ambulatory Visit: Payer: Self-pay

## 2017-11-05 ENCOUNTER — Telehealth: Payer: Self-pay | Admitting: *Deleted

## 2017-11-05 DIAGNOSIS — I351 Nonrheumatic aortic (valve) insufficiency: Secondary | ICD-10-CM | POA: Diagnosis not present

## 2017-11-05 DIAGNOSIS — I119 Hypertensive heart disease without heart failure: Secondary | ICD-10-CM | POA: Insufficient documentation

## 2017-11-05 DIAGNOSIS — I4891 Unspecified atrial fibrillation: Secondary | ICD-10-CM

## 2017-11-05 DIAGNOSIS — E785 Hyperlipidemia, unspecified: Secondary | ICD-10-CM | POA: Diagnosis not present

## 2017-11-05 NOTE — Telephone Encounter (Signed)
-----   Message from Dollene Primrose, RN sent at 10/29/2017 12:21 PM EDT ----- Hello,  We will need a sleep study scheduled for this patient.   Thank you :)  [place]

## 2017-11-05 NOTE — Telephone Encounter (Signed)
Sent to precert 

## 2017-11-05 NOTE — Telephone Encounter (Signed)
Faxed PA request for in-lab sleep study.

## 2017-11-08 DIAGNOSIS — M9903 Segmental and somatic dysfunction of lumbar region: Secondary | ICD-10-CM | POA: Diagnosis not present

## 2017-11-08 DIAGNOSIS — M9905 Segmental and somatic dysfunction of pelvic region: Secondary | ICD-10-CM | POA: Diagnosis not present

## 2017-11-08 DIAGNOSIS — M5136 Other intervertebral disc degeneration, lumbar region: Secondary | ICD-10-CM | POA: Diagnosis not present

## 2017-11-08 DIAGNOSIS — M5416 Radiculopathy, lumbar region: Secondary | ICD-10-CM | POA: Diagnosis not present

## 2017-11-11 DIAGNOSIS — H2511 Age-related nuclear cataract, right eye: Secondary | ICD-10-CM | POA: Diagnosis not present

## 2017-11-11 DIAGNOSIS — H25811 Combined forms of age-related cataract, right eye: Secondary | ICD-10-CM | POA: Diagnosis not present

## 2017-11-15 ENCOUNTER — Encounter: Payer: Self-pay | Admitting: *Deleted

## 2017-11-15 NOTE — Telephone Encounter (Signed)
Patient is scheduled for lab study on December 06 2017. The sleep study will be done at [facility] sleep lab. Left detailed message on voicemail with date and time of titration and informed patient to call back to confirm or reschedule.

## 2017-11-15 NOTE — Addendum Note (Signed)
Addended by: Freada Bergeron on: 11/15/2017 06:23 PM   Modules accepted: Level of Service, SmartSet

## 2017-11-15 NOTE — Telephone Encounter (Signed)
This encounter was created in error - please disregard.

## 2017-11-19 DIAGNOSIS — Z8546 Personal history of malignant neoplasm of prostate: Secondary | ICD-10-CM | POA: Diagnosis not present

## 2017-11-19 DIAGNOSIS — N99114 Postprocedural urethral stricture, male, unspecified: Secondary | ICD-10-CM | POA: Diagnosis not present

## 2017-11-19 DIAGNOSIS — R3912 Poor urinary stream: Secondary | ICD-10-CM | POA: Diagnosis not present

## 2017-11-25 DIAGNOSIS — H35071 Retinal telangiectasis, right eye: Secondary | ICD-10-CM | POA: Diagnosis not present

## 2017-11-25 DIAGNOSIS — H43822 Vitreomacular adhesion, left eye: Secondary | ICD-10-CM | POA: Diagnosis not present

## 2017-12-06 ENCOUNTER — Ambulatory Visit (HOSPITAL_BASED_OUTPATIENT_CLINIC_OR_DEPARTMENT_OTHER): Payer: Medicare Other | Attending: Internal Medicine | Admitting: Cardiology

## 2017-12-06 DIAGNOSIS — G4733 Obstructive sleep apnea (adult) (pediatric): Secondary | ICD-10-CM | POA: Diagnosis not present

## 2017-12-06 DIAGNOSIS — I4891 Unspecified atrial fibrillation: Secondary | ICD-10-CM | POA: Diagnosis not present

## 2017-12-19 NOTE — Procedures (Signed)
   Patient Name: Antonio Clark, Antonio Clark Date:07/27/2017 12/06/2017 Gender: Male D.O.B: 11/03/1935 Age (years): 81 Referring Provider: Virl Axe Height (inches): 28 Interpreting Physician: Fransico Him MD, ABSM Weight (lbs): 197 RPSGT: Zadie Rhine BMI: 29 MRN: [id number] Neck Size: 16.50  CLINICAL INFORMATION Sleep Study Type: NPSG  Indication for sleep study: OSA  Epworth Sleepiness Score: 6  SLEEP STUDY TECHNIQUE As per the AASM Manual for the Scoring of Sleep and Associated Events v2.3 (April 2016) with a hypopnea requiring 4% desaturations.  The channels recorded and monitored were frontal, central and occipital EEG, electrooculogram (EOG), submentalis EMG (chin), nasal and oral airflow, thoracic and abdominal wall motion, anterior tibialis EMG, snore microphone, electrocardiogram, and pulse oximetry.  MEDICATIONS Medications self-administered by patient taken the night of the study : N/A  SLEEP ARCHITECTURE The study was initiated at 10:17:20 PM and ended at 5:01:08 AM.  Sleep onset time was 97.0 minutes and the sleep efficiency was 66.2%%. The total sleep time was 267.5 minutes.  Stage REM latency was 95.5 minutes.  The patient spent 2.1%% of the night in stage N1 sleep, 68.4%% in stage N2 sleep, 3.2%% in stage N3 and 26.36% in REM.  Alpha intrusion was absent.  Supine sleep was 0.00%.  RESPIRATORY PARAMETERS The overall apnea/hypopnea index (AHI) was 13.5 per hour. There were 1 total apneas, including 1 obstructive, 0 central and 0 mixed apneas. There were 59 hypopneas and 7 RERAs.  The AHI during Stage REM sleep was 23.8 per hour.  AHI while supine was N/A per hour.  The mean oxygen saturation was 91.7%. The minimum SpO2 during sleep was 82.0%.  moderate snoring was noted during this study.  CARDIAC DATA The 2 lead EKG demonstrated sinus rhythm. The mean heart rate was 58.9 beats per minute. Other EKG findings include: PVCs.  LEG MOVEMENT DATA The total  PLMS were 0 with a resulting PLMS index of 0.0. Associated arousal with leg movement index was 0.9 .  IMPRESSIONS - Mild to moderate obstructive sleep apnea occurred during this study (AHI = 13.5/h). - No significant central sleep apnea occurred during this study (CAI = 0.0/h). - Mild oxygen desaturation was noted during this study (Min O2 = 82.0%). - The patient snored with moderate snoring volume. - EKG findings include PVCs. - Clinically significant periodic limb movements did not occur during sleep. No significant associated arousals.  DIAGNOSIS - Obstructive Sleep Apnea (327.23 [G47.33 ICD-10]) - Nocturnal Hypoxemia (327.26 [G47.36 ICD-10])  RECOMMENDATIONS - Therapeutic CPAP titration to determine optimal pressure required to alleviate sleep disordered breathing. - Avoid alcohol, sedatives and other CNS depressants that may worsen sleep apnea and disrupt normal sleep architecture. - Sleep hygiene should be reviewed to assess factors that may improve sleep quality. - Weight management and regular exercise should be initiated or continued if appropriate.  [Electronically signed] 12/19/2017 01:05 PM  Fransico Him MD, ABSM Diplomate, American Board of Sleep Medicine

## 2017-12-20 ENCOUNTER — Telehealth: Payer: Self-pay | Admitting: *Deleted

## 2017-12-20 DIAGNOSIS — R400 Somnolence: Secondary | ICD-10-CM

## 2017-12-20 NOTE — Telephone Encounter (Signed)
Informed patient of sleep study results and patient understanding was verbalized. Patient understands his sleep study showed he has  sleep apnea and Dr Radford Pax recommends a CPAP titration. Please set up titration in the sleep lab.

## 2017-12-20 NOTE — Telephone Encounter (Signed)
-----   Message from Sueanne Margarita, MD sent at 12/19/2017  1:07 PM EDT ----- Please let patient know that they have sleep apnea and recommend CPAP titration. Please set up titration in the sleep lab.

## 2017-12-23 DIAGNOSIS — H268 Other specified cataract: Secondary | ICD-10-CM | POA: Diagnosis not present

## 2017-12-23 DIAGNOSIS — H25812 Combined forms of age-related cataract, left eye: Secondary | ICD-10-CM | POA: Diagnosis not present

## 2017-12-24 ENCOUNTER — Telehealth: Payer: Self-pay | Admitting: *Deleted

## 2017-12-24 NOTE — Telephone Encounter (Signed)
PA submitted to [facility] for CPAP titration in lab study.

## 2017-12-24 NOTE — Telephone Encounter (Signed)
-----   Message from Freada Bergeron, Barlow sent at 12/23/2017  1:04 PM EDT ----- Regarding: PRE CERT CPAP TITRATION

## 2017-12-28 ENCOUNTER — Telehealth: Payer: Self-pay | Admitting: *Deleted

## 2017-12-28 NOTE — Telephone Encounter (Signed)
-----   Message from Freada Bergeron, Mazie sent at 12/23/2017  1:04 PM EDT ----- Regarding: PRE CERT CPAP TITRATION

## 2017-12-28 NOTE — Telephone Encounter (Signed)
Received a call from [facility] Informing me the patient's medicare is primary therefore a PA is not required for the titration study. Gae Bon notified ok to schedule.

## 2017-12-29 ENCOUNTER — Encounter: Payer: Self-pay | Admitting: Cardiology

## 2017-12-29 ENCOUNTER — Ambulatory Visit (INDEPENDENT_AMBULATORY_CARE_PROVIDER_SITE_OTHER): Payer: Medicare Other | Admitting: Cardiology

## 2017-12-29 ENCOUNTER — Encounter: Payer: Self-pay | Admitting: *Deleted

## 2017-12-29 VITALS — BP 120/82 | HR 60 | Ht 69.0 in | Wt 201.2 lb

## 2017-12-29 DIAGNOSIS — G4733 Obstructive sleep apnea (adult) (pediatric): Secondary | ICD-10-CM | POA: Diagnosis not present

## 2017-12-29 DIAGNOSIS — I4891 Unspecified atrial fibrillation: Secondary | ICD-10-CM | POA: Diagnosis not present

## 2017-12-29 DIAGNOSIS — Z8249 Family history of ischemic heart disease and other diseases of the circulatory system: Secondary | ICD-10-CM | POA: Diagnosis not present

## 2017-12-29 NOTE — Progress Notes (Signed)
Cardiology Office Note    Date:  12/29/2017   ID:  Antonio Clark, DOB [birth year], MRN [id number]  PCP:  Susy Frizzle, MD  Cardiologist:   Candee Furbish, MD     History of Present Illness:  Antonio Clark is a 82 y.o. male with history of diabetes or hypertension, hyperlipidemia seen previously by Dr. Aundra Dubin on 07/02/15 here for follow-up. Antonio Clark.   Strong family history of CAD. 3 family members have bypass. He also has another sister that is in her 20s. Tolerating pravastatin.   Brother died age 65 CAD.  Cardiolite 11-26-11 atypical chest pain-normal. EKG with normal sinus rhythm, PVCs.  Blood sugar has been elevated recently. Blood pressure at times can be elevated as well. Compliant with medications.  Walks at the [facility] most days to 3 miles.  Retired from [employer] and [employer]. Has been taking aspirin 81 mg for prevention.  Enjoys going on [employer] trips. [place].  12/29/17 - rapid HR, BP 90's. Saw Dr. Caryl Comes. Urology office.  By the time he saw Dr. Caryl Comes, atrial fibrillation was resolved.  Sleep eval. +OSA. Trying CPAP but claustrophobic. Started Eliquis.   Past Medical History:  Diagnosis Date  . Borderline diabetic   . History of echocardiogram 07/ 07/ Nov 25, 2009  . History of lithotripsy 11/26/87  . Hyperlipidemia   . Hypertension   . Prostate CA ([place]) 11-25-2008    Past Surgical History:  Procedure Laterality Date  . COLONOSCOPY  Nov 25, 2000  . INSERTION PROSTATE RADIATION SEED  8 4 2010   per Dr Rosana Hoes  . POLYPECTOMY  11/25/2000  . THROAT SURGERY  1980   benign cyst  . URETHRAL STRICTURE DILATATION     also penile implant    Current Medications: Outpatient Medications Prior to Visit  Medication Sig Dispense Refill  . apixaban (ELIQUIS) 5 MG TABS tablet Take 1 tablet (5 mg total) by mouth 2 (two) times daily. 60 tablet 11  . losartan-hydrochlorothiazide (HYZAAR) 100-12.5 MG tablet Take 1 tablet by mouth daily. 90 tablet 3  . multivitamin (THERAGRAN) per tablet Take 1 tablet  by mouth daily.      . Omega-3 Fatty Acids (FISH OIL) 1200 MG CAPS Take 1,200 mg by mouth daily.      Marland Kitchen omeprazole (PRILOSEC) 20 MG capsule Take 1 capsule (20 mg total) by mouth daily. 90 capsule 4  . pravastatin (PRAVACHOL) 40 MG tablet TAKE 1 TABLET BY MOUTH EVERY DAY IN THE EVENING 90 tablet 3  . Tamsulosin HCl (FLOMAX) 0.4 MG CAPS Take 0.4 mg by mouth daily.       No facility-administered medications prior to visit.      Allergies:   Patient has no known allergies.   Social History   Socioeconomic History  . Marital status: Single    Spouse name: Not on file  . Number of children: Not on file  . Years of education: Not on file  . Highest education level: Not on file  Occupational History  . Occupation: retired  Scientific laboratory technician  . Financial resource strain: Not on file  . Food insecurity:    Worry: Not on file    Inability: Not on file  . Transportation needs:    Medical: Not on file    Non-medical: Not on file  Tobacco Use  . Smoking status: Never Smoker  . Smokeless tobacco: Never Used  Substance and Sexual Activity  . Alcohol use: No  . Drug use: No  . Sexual activity: Yes  Lifestyle  . Physical activity:    Days per week: Not on file    Minutes per session: Not on file  . Stress: Not on file  Relationships  . Social connections:    Talks on phone: Not on file    Gets together: Not on file    Attends religious service: Not on file    Active member of club or organization: Not on file    Attends meetings of clubs or organizations: Not on file    Relationship status: Not on file  Other Topics Concern  . Not on file  Social History Narrative  . Not on file     Family History:  The patient's family history includes Arrhythmia in his brother; Clark in his mother; Colon Clark (age of onset: 89) in his mother; Coronary artery disease in his brother; Diabetes in his brother and brother; Hearing loss in his brother; Heart disease in his brother and father; Heart  failure in his brother; Hyperlipidemia in his sister; Hypertension in his brother, brother, brother, and father; Stroke in his mother and sister; Sudden death in his father; Suicidality in his sister.   ROS:   Please see the history of present illness.    Review of Systems  All other systems reviewed and are negative.     PHYSICAL EXAM:   VS:  BP 120/82   Pulse 60   Ht 5\' 9"  (1.753 m)   Wt 201 lb 3.2 oz (91.3 kg)   BMI 29.71 kg/m    GEN: Well nourished, well developed, in no acute distress  HEENT: normal  Neck: no JVD, carotid bruits, or masses Cardiac: RRR; no murmurs, rubs, or gallops,no edema  Respiratory:  clear to auscultation bilaterally, normal work of breathing GI: soft, nontender, nondistended, + BS MS: no deformity or atrophy  Skin: warm and dry, no rash Neuro:  Alert and Oriented x 3, Strength and sensation are intact Psych: euthymic mood, full affect    Wt Readings from Last 3 Encounters:  12/29/17 201 lb 3.2 oz (91.3 kg)  12/06/17 197 lb (89.4 kg)  10/29/17 202 lb (91.6 kg)      Studies/Labs Reviewed:   EKG:  09/09/17 - NSR personally viewed. 09/09/16-sinus rhythm 64 with no other abnormalities. Personally viewed.  Recent Labs: 01/18/2017: ALT 15 10/29/2017: BUN 19; Creatinine, Ser 1.02; Hemoglobin 14.6; Platelets 144; Potassium 4.4; Sodium 140   Lipid Panel    Component Value Date/Time   CHOL 143 01/18/2017 0813   TRIG 169 (H) 01/18/2017 0813   HDL 44 01/18/2017 0813   CHOLHDL 3.3 01/18/2017 0813   VLDL 34 (H) 01/18/2017 0813   LDLCALC 65 01/18/2017 0813    Additional studies/ records that were reviewed today include:  Prior medical records reviewed, lab work reviewed. EKG reviewed.  11/05/2017-echocardiogram - Left ventricle: The cavity size was normal. There was mild focal   basal hypertrophy of the septum. Systolic function was normal.   The estimated ejection fraction was in the range of 50% to 55%.   Wall motion was normal; there were no  regional wall motion   abnormalities. Features are consistent with a pseudonormal left   ventricular filling pattern, with concomitant abnormal relaxation   and increased filling pressure (grade 2 diastolic dysfunction). - Aortic valve: Trileaflet; moderately thickened, mildly calcified   leaflets. There was mild regurgitation. Regurgitation pressure   half-time: 701 ms. - Aorta: Aortic root dimension: 38 mm (ED). - Aortic root: The aortic root was mildly dilated. -  Mitral valve: Calcified annulus. There was trivial regurgitation. - Left atrium: The atrium was moderately dilated. - Right ventricle: The cavity size was mildly dilated. Wall   thickness was normal. - Atrial septum: There was increased thickness of the septum,   consistent with lipomatous hypertrophy. - Tricuspid valve: There was trivial regurgitation. - Pulmonic valve: There was trivial regurgitation.    ASSESSMENT:    1. New onset atrial fibrillation ([place])   2. Family history of early CAD   3. OSA (obstructive sleep apnea)      PLAN:  In order of problems listed above:  ATRIAL FIBRILLATION-newly diagnosed 10/29/2017  - CHADSVAC - 3.  Symptomatic, blood pressure was low, felt poor.  Seen originally by Dr. Caryl Comes as a work in visit.  DOD.  He is not on any antiarrhythmics.  If heart rate were to accelerate again and hypotension were to occur, we have room to decrease his Hyzaar and potentially add diltiazem for instance.  Antiarrhythmics could be a strategy we have to watch for hypotension.  We also have ablative therapies as well.  Roderic Palau in atrial fibrillation clinic can be helpful.  OSA  - claustrophobic.  Concerned about mask.  Family history of CAD  - Continue with primary prevention efforts.  Brother had myocardial infarction age 72.  We have stopped the aspirin since he is on Eliquis.  Essential hypertension  - Medications reviewed. Stable.  Very well controlled.   Hyperlipidemia  - Pravastatin.  Doing well. No side effects.   - LDL 65 doing well.  Elevated glucose  - Strongly encouraged weight loss. This will help not only with his glucose but also with his blood pressure.   A1c 5.7.  Enjoy mission trip.   One year f/u with me, 6 months with Cecille Rubin.     Medication Adjustments/Labs and Tests Ordered: Current medicines are reviewed at length with the patient today.  Concerns regarding medicines are outlined above.  Medication changes, Labs and Tests ordered today are listed in the Patient Instructions below. Patient Instructions  Medication Instructions:  The current medical regimen is effective;  continue present plan and medications.  Follow-Up: Follow up in 6 months with Truitt Merle, NP.  You will receive a letter in the mail 2 months before you are due.  Please call us when you receive this letter to schedule your follow up appointment.  Follow up in 1 year with Dr. Marlou Porch.  You will receive a letter in the mail 2 months before you are due.  Please call us when you receive this letter to schedule your follow up appointment.  If you need a refill on your cardiac medications before your next appointment, please call your pharmacy.  Thank you for choosing [facility]!!        Signed, Candee Furbish, MD  12/29/2017 9:10 AM    [facility] [address], [place], [place]  [address] Phone: [phone]; Fax: [phone]

## 2017-12-29 NOTE — Telephone Encounter (Addendum)
Patient is scheduled for lab study on 01/24/18. Patient understands his CPAP titration study will be done at [facility] sleep lab. Patient understands he will receive a sleep packet in a week or so. Patient understands to call if he does not receive the sleep packet in a timely manner. Patient agrees with treatment and thanked me for call.

## 2017-12-29 NOTE — Patient Instructions (Signed)
Medication Instructions:  The current medical regimen is effective;  continue present plan and medications.  Follow-Up: Follow up in 6 months with Lori Gerhardt, NP.  You will receive a letter in the mail 2 months before you are due.  Please call us when you receive this letter to schedule your follow up appointment.  Follow up in 1 year with Dr. Skains.  You will receive a letter in the mail 2 months before you are due.  Please call us when you receive this letter to schedule your follow up appointment.  If you need a refill on your cardiac medications before your next appointment, please call your pharmacy.  Thank you for choosing [facility]!!     

## 2018-01-07 DIAGNOSIS — Z960 Presence of urogenital implants: Secondary | ICD-10-CM | POA: Diagnosis not present

## 2018-01-07 DIAGNOSIS — N393 Stress incontinence (female) (male): Secondary | ICD-10-CM | POA: Diagnosis not present

## 2018-01-07 DIAGNOSIS — E291 Testicular hypofunction: Secondary | ICD-10-CM | POA: Diagnosis not present

## 2018-01-07 DIAGNOSIS — Z87442 Personal history of urinary calculi: Secondary | ICD-10-CM | POA: Diagnosis not present

## 2018-01-07 DIAGNOSIS — C61 Malignant neoplasm of prostate: Secondary | ICD-10-CM | POA: Diagnosis not present

## 2018-01-07 DIAGNOSIS — R3912 Poor urinary stream: Secondary | ICD-10-CM | POA: Diagnosis not present

## 2018-01-07 DIAGNOSIS — N529 Male erectile dysfunction, unspecified: Secondary | ICD-10-CM | POA: Diagnosis not present

## 2018-01-07 DIAGNOSIS — N99114 Postprocedural urethral stricture, male, unspecified: Secondary | ICD-10-CM | POA: Diagnosis not present

## 2018-01-11 ENCOUNTER — Other Ambulatory Visit: Payer: Self-pay | Admitting: Family Medicine

## 2018-01-11 DIAGNOSIS — E785 Hyperlipidemia, unspecified: Secondary | ICD-10-CM

## 2018-01-11 DIAGNOSIS — Z8546 Personal history of malignant neoplasm of prostate: Secondary | ICD-10-CM

## 2018-01-11 DIAGNOSIS — E7800 Pure hypercholesterolemia, unspecified: Secondary | ICD-10-CM

## 2018-01-11 DIAGNOSIS — Z79899 Other long term (current) drug therapy: Secondary | ICD-10-CM

## 2018-01-11 DIAGNOSIS — C61 Malignant neoplasm of prostate: Secondary | ICD-10-CM

## 2018-01-11 DIAGNOSIS — Z0000 Encounter for general adult medical examination without abnormal findings: Secondary | ICD-10-CM

## 2018-01-11 DIAGNOSIS — I10 Essential (primary) hypertension: Secondary | ICD-10-CM

## 2018-01-24 ENCOUNTER — Other Ambulatory Visit: Payer: Medicare Other

## 2018-01-24 ENCOUNTER — Ambulatory Visit (HOSPITAL_BASED_OUTPATIENT_CLINIC_OR_DEPARTMENT_OTHER): Payer: Medicare Other | Attending: Cardiology | Admitting: Cardiology

## 2018-01-24 VITALS — Ht 69.0 in | Wt 197.0 lb

## 2018-01-24 DIAGNOSIS — R400 Somnolence: Secondary | ICD-10-CM

## 2018-01-24 DIAGNOSIS — G4733 Obstructive sleep apnea (adult) (pediatric): Secondary | ICD-10-CM | POA: Diagnosis not present

## 2018-01-25 ENCOUNTER — Encounter (HOSPITAL_BASED_OUTPATIENT_CLINIC_OR_DEPARTMENT_OTHER): Payer: Medicare Other

## 2018-01-25 ENCOUNTER — Other Ambulatory Visit: Payer: Medicare Other

## 2018-01-25 DIAGNOSIS — I10 Essential (primary) hypertension: Secondary | ICD-10-CM

## 2018-01-25 DIAGNOSIS — E7800 Pure hypercholesterolemia, unspecified: Secondary | ICD-10-CM

## 2018-01-25 DIAGNOSIS — Z79899 Other long term (current) drug therapy: Secondary | ICD-10-CM | POA: Diagnosis not present

## 2018-01-25 DIAGNOSIS — Z0000 Encounter for general adult medical examination without abnormal findings: Secondary | ICD-10-CM | POA: Diagnosis not present

## 2018-01-25 DIAGNOSIS — E785 Hyperlipidemia, unspecified: Secondary | ICD-10-CM

## 2018-01-27 ENCOUNTER — Telehealth: Payer: Self-pay | Admitting: *Deleted

## 2018-01-27 MED ORDER — DABIGATRAN ETEXILATE MESYLATE 150 MG PO CAPS: 150.0000 mg | ORAL_CAPSULE | Freq: Two times a day (BID) | ORAL | 3 refills | Status: DC

## 2018-01-27 NOTE — Telephone Encounter (Signed)
Pt requests something less expensive than Eliquis.  Pradaxa is on his formulary and has been ordred by Dr Marlou Porch 150 mg twice a day.

## 2018-01-28 ENCOUNTER — Encounter: Payer: Self-pay | Admitting: Family Medicine

## 2018-01-28 ENCOUNTER — Ambulatory Visit (INDEPENDENT_AMBULATORY_CARE_PROVIDER_SITE_OTHER): Payer: Medicare Other | Admitting: Family Medicine

## 2018-01-28 VITALS — BP 128/70 | HR 80 | Temp 97.9°F | Resp 16 | Ht 69.0 in | Wt 200.0 lb

## 2018-01-28 DIAGNOSIS — I10 Essential (primary) hypertension: Secondary | ICD-10-CM

## 2018-01-28 DIAGNOSIS — I4891 Unspecified atrial fibrillation: Secondary | ICD-10-CM | POA: Diagnosis not present

## 2018-01-28 DIAGNOSIS — Z0000 Encounter for general adult medical examination without abnormal findings: Secondary | ICD-10-CM

## 2018-01-28 DIAGNOSIS — M720 Palmar fascial fibromatosis [Dupuytren]: Secondary | ICD-10-CM | POA: Diagnosis not present

## 2018-01-28 DIAGNOSIS — E7800 Pure hypercholesterolemia, unspecified: Secondary | ICD-10-CM | POA: Diagnosis not present

## 2018-01-28 DIAGNOSIS — R7303 Prediabetes: Secondary | ICD-10-CM | POA: Diagnosis not present

## 2018-01-28 DIAGNOSIS — I480 Paroxysmal atrial fibrillation: Secondary | ICD-10-CM | POA: Insufficient documentation

## 2018-01-28 DIAGNOSIS — C61 Malignant neoplasm of prostate: Secondary | ICD-10-CM

## 2018-01-28 NOTE — Progress Notes (Signed)
Subjective:    Patient ID: Antonio Clark, male    DOB: 06/14/1936, 82 y.o.   MRN: [id number]  HPI  Here for CPE.  He has no medical concerns today. Past medical history is significant for prostate cancer. Dr. Rosana Hoes implanted radioactive seeds. He is currently followed by urology.  Since I last saw the patient, he developed atrial fibrillation.  He has paroxysmal atrial fibrillation as today he is in normal sinus rhythm.  His rate is well controlled and he is currently anticoagulated with Pradaxa.  The remainder of his cancer screening is up-to-date.  Over the last few months, he admits to dietary indiscretion.  He is been eating a very high carbohydrate diet and therefore his fasting blood sugar has risen from 112-126 and his triglycerides have gone from 169 to greater than 300.  He is exercising but is not eating well.  His immunizations are up-to-date and his cancer screening is up-to-date.  Most recent lab work is listed below: Appointment on 01/25/2018  Component Date Value Ref Range Status  . WBC 01/25/2018 5.6  3.8 - 10.8 Thousand/uL Final  . RBC 01/25/2018 4.51  4.20 - 5.80 Million/uL Final  . Hemoglobin 01/25/2018 13.7  13.2 - 17.1 g/dL Final  . HCT 01/25/2018 39.3  38.5 - 50.0 % Final  . MCV 01/25/2018 87.1  80.0 - 100.0 fL Final  . MCH 01/25/2018 30.4  27.0 - 33.0 pg Final  . MCHC 01/25/2018 34.9  32.0 - 36.0 g/dL Final  . RDW 01/25/2018 13.1  11.0 - 15.0 % Final  . Platelets 01/25/2018 132* 140 - 400 Thousand/uL Final  . MPV 01/25/2018 9.2  7.5 - 12.5 fL Final  . Neutro Abs 01/25/2018 3,063  1,500 - 7,800 cells/uL Final  . Lymphs Abs 01/25/2018 1,467  850 - 3,900 cells/uL Final  . WBC mixed population 01/25/2018 560  200 - 950 cells/uL Final  . Eosinophils Absolute 01/25/2018 459  15 - 500 cells/uL Final  . Basophils Absolute 01/25/2018 50  0 - 200 cells/uL Final  . Neutrophils Relative % 01/25/2018 54.7  % Final  . Total Lymphocyte 01/25/2018 26.2  % Final  . Monocytes Relative  01/25/2018 10.0  % Final  . Eosinophils Relative 01/25/2018 8.2  % Final  . Basophils Relative 01/25/2018 0.9  % Final  . Glucose, Bld 01/25/2018 126* 65 - 99 mg/dL Final   Comment: .            Fasting reference interval . For someone without known diabetes, a glucose value >125 mg/dL indicates that they may have diabetes and this should be confirmed with a follow-up test. .   . BUN 01/25/2018 19  7 - 25 mg/dL Final  . Creat 01/25/2018 0.96  0.70 - 1.11 mg/dL Final   Comment: For patients >12 years of age, the reference limit for Creatinine is approximately 13% higher for people identified as African-American. .   Havery Moros Ratio 80/32/1224 NOT APPLICABLE  6 - 22 (calc) Final  . Sodium 01/25/2018 137  135 - 146 mmol/L Final  . Potassium 01/25/2018 4.1  3.5 - 5.3 mmol/L Final  . Chloride 01/25/2018 101  98 - 110 mmol/L Final  . CO2 01/25/2018 27  20 - 32 mmol/L Final  . Calcium 01/25/2018 9.7  8.6 - 10.3 mg/dL Final  . Total Protein 01/25/2018 6.9  6.1 - 8.1 g/dL Final  . Albumin 01/25/2018 4.5  3.6 - 5.1 g/dL Final  . Globulin 01/25/2018 2.4  1.9 -  3.7 g/dL (calc) Final  . AG Ratio 01/25/2018 1.9  1.0 - 2.5 (calc) Final  . Total Bilirubin 01/25/2018 0.6  0.2 - 1.2 mg/dL Final  . Alkaline phosphatase (APISO) 01/25/2018 50  40 - 115 U/L Final  . AST 01/25/2018 16  10 - 35 U/L Final  . ALT 01/25/2018 16  9 - 46 U/L Final  . Cholesterol 01/25/2018 168  <200 mg/dL Final  . HDL 01/25/2018 45  >40 mg/dL Final  . Triglycerides 01/25/2018 312* <150 mg/dL Final  . LDL Cholesterol (Calc) 01/25/2018 85  mg/dL (calc) Final   Comment: Reference range: <100 . Desirable range <100 mg/dL for primary prevention;   <70 mg/dL for patients with CHD or diabetic patients  with > or = 2 CHD risk factors. Marland Kitchen LDL-C is now calculated using the Martin-Hopkins  calculation, which is a validated novel method providing  better accuracy than the Friedewald equation in the  estimation of LDL-C.    Cresenciano Genre et al. Annamaria Helling. 0762;263(33): 2061-2068  (http://education.[facility].com/faq/FAQ164)   . Total CHOL/HDL Ratio 01/25/2018 3.7  <5.0 (calc) Final  . Non-HDL Cholesterol (Calc) 01/25/2018 123  <130 mg/dL (calc) Final   Comment: For patients with diabetes plus 1 major ASCVD risk  factor, treating to a non-HDL-C goal of <100 mg/dL  (LDL-C of <70 mg/dL) is considered a therapeutic  option.     Past Medical History:  Diagnosis Date  . Borderline diabetic   . History of echocardiogram 07/ 07/ 2011  . History of lithotripsy 1989  . Hyperlipidemia   . Hypertension   . Prostate CA ([place]) 2010   Past Surgical History:  Procedure Laterality Date  . COLONOSCOPY  2002  . INSERTION PROSTATE RADIATION SEED  8 4 2010   per Dr Rosana Hoes  . POLYPECTOMY  2002  . THROAT SURGERY  1980   benign cyst  . URETHRAL STRICTURE DILATATION     also penile implant   Current Outpatient Medications on File Prior to Visit  Medication Sig Dispense Refill  . dabigatran (PRADAXA) 150 MG CAPS capsule Take 1 capsule (150 mg total) by mouth 2 (two) times daily. 180 capsule 3  . losartan-hydrochlorothiazide (HYZAAR) 100-12.5 MG tablet Take 1 tablet by mouth daily. 90 tablet 3  . multivitamin (THERAGRAN) per tablet Take 1 tablet by mouth daily.      . Omega-3 Fatty Acids (FISH OIL) 1200 MG CAPS Take 1,200 mg by mouth daily.      Marland Kitchen omeprazole (PRILOSEC) 20 MG capsule Take 1 capsule (20 mg total) by mouth daily. 90 capsule 4  . pravastatin (PRAVACHOL) 40 MG tablet TAKE 1 TABLET BY MOUTH EVERY DAY IN THE EVENING 90 tablet 3  . Tamsulosin HCl (FLOMAX) 0.4 MG CAPS Take 0.4 mg by mouth daily.       No current facility-administered medications on file prior to visit.    No Known Allergies Social History   Socioeconomic History  . Marital status: Single    Spouse name: Not on file  . Number of children: Not on file  . Years of education: Not on file  . Highest education level: Not on file  Occupational  History  . Occupation: retired  Scientific laboratory technician  . Financial resource strain: Not on file  . Food insecurity:    Worry: Not on file    Inability: Not on file  . Transportation needs:    Medical: Not on file    Non-medical: Not on file  Tobacco Use  . Smoking  status: Never Smoker  . Smokeless tobacco: Never Used  Substance and Sexual Activity  . Alcohol use: No  . Drug use: No  . Sexual activity: Yes  Lifestyle  . Physical activity:    Days per week: Not on file    Minutes per session: Not on file  . Stress: Not on file  Relationships  . Social connections:    Talks on phone: Not on file    Gets together: Not on file    Attends religious service: Not on file    Active member of club or organization: Not on file    Attends meetings of clubs or organizations: Not on file    Relationship status: Not on file  . Intimate partner violence:    Fear of current or ex partner: Not on file    Emotionally abused: Not on file    Physically abused: Not on file    Forced sexual activity: Not on file  Other Topics Concern  . Not on file  Social History Narrative  . Not on file   Family History  Problem Relation Age of Onset  . Coronary artery disease Brother        3 brothers had CABG  . Arrhythmia Brother   . Diabetes Brother   . Hearing loss Brother   . Hypertension Brother   . Heart disease Brother   . Heart failure Brother   . Hypertension Brother   . Heart disease Father   . Hypertension Father   . Sudden death Father   . Stroke Mother        cerebral hemorrage  . Colon cancer Mother 35       small intestine  . Cancer Mother   . Suicidality Sister   . Stroke Sister   . Diabetes Brother   . Hypertension Brother   . Hyperlipidemia Sister   . Esophageal cancer Neg Hx   . Pancreatic cancer Neg Hx   . Prostate cancer Neg Hx   . Rectal cancer Neg Hx   . Stomach cancer Neg Hx      Review of Systems  All other systems reviewed and are negative.      Objective:    Physical Exam  Constitutional: He is oriented to person, place, and time. He appears well-developed and well-nourished. No distress.  HENT:  Head: Normocephalic and atraumatic.  Right Ear: External ear normal.  Left Ear: External ear normal.  Nose: Nose normal.  Mouth/Throat: Oropharynx is clear and moist. No oropharyngeal exudate.  Eyes: Pupils are equal, round, and reactive to light. Conjunctivae and EOM are normal. Right eye exhibits no discharge. Left eye exhibits no discharge. No scleral icterus.  Neck: Normal range of motion. Neck supple. No JVD present. No tracheal deviation present. No thyromegaly present.  Cardiovascular: Normal rate, regular rhythm, normal heart sounds and intact distal pulses. Exam reveals no gallop and no friction rub.  No murmur heard. Pulmonary/Chest: Effort normal and breath sounds normal. No stridor. No respiratory distress. He has no wheezes. He has no rales. He exhibits no tenderness.  Abdominal: Soft. Bowel sounds are normal. He exhibits no distension and no mass. There is no tenderness. There is no rebound and no guarding.  Musculoskeletal: Normal range of motion. He exhibits no edema or tenderness.  Lymphadenopathy:    He has no cervical adenopathy.  Neurological: He is alert and oriented to person, place, and time. He has normal reflexes. No cranial nerve deficit. He exhibits normal muscle tone.  Coordination normal.  Skin: Skin is warm. No rash noted. He is not diaphoretic. No erythema. No pallor.  Psychiatric: He has a normal mood and affect. His behavior is normal. Judgment and thought content normal.  Vitals reviewed. patient does have small actinic keratoses on his forehead, he sees Dr. [place] annually. Patient has scar tissue in the flexor tendon in the palm of his hand of his fourth digit on both hands and also the third digit on his right hand consistent with the contracture    Assessment & Plan:  Routine general medical examination at a  health care facility  Benign essential HTN  Pure hypercholesterolemia  Malignant neoplasm of prostate [facility])  Prediabetes  Patient is scheduled to see his urologist next week to check his PSA.  The remainder of his cancer screening is up-to-date.  He has Dupuytren's contracture in both hands I would like to see a hand surgeon to discuss treatment options.  I will schedule this for him.  His immunizations are up-to-date.  Lab work is excellent except for the significant rise in his triglycerides along with his fasting blood sugar.  Patient would like to try 3 months of aggressive diet and lifestyle modifications and then recheck fasting lab work which I believe is reasonable.  Reassess lab work in 3 months.  Today he is in normal sinus rhythm however he remains appropriately anticoagulated for paroxysmal atrial fibrillation Immunization History  Administered Date(s) Administered  . Influenza, High Dose Seasonal PF 06/07/2017  . Influenza,inj,Quad PF,6+ Mos 05/12/2016  . Influenza-Unspecified 05/30/2015  . Pneumococcal Conjugate-13 07/08/2015  . Pneumococcal Polysaccharide-23 01/20/2016  . Tdap 06/11/2011  . Zoster Recombinat (Shingrix) 07/09/2017, 09/30/2017   Lab work is outstanding. Fasting blood sugar is much better than last time. He has been working hard on this and I congratulated him on his efforts and his success.

## 2018-01-31 DIAGNOSIS — E291 Testicular hypofunction: Secondary | ICD-10-CM | POA: Diagnosis not present

## 2018-01-31 DIAGNOSIS — N200 Calculus of kidney: Secondary | ICD-10-CM | POA: Diagnosis not present

## 2018-01-31 DIAGNOSIS — N529 Male erectile dysfunction, unspecified: Secondary | ICD-10-CM | POA: Diagnosis not present

## 2018-01-31 DIAGNOSIS — Z960 Presence of urogenital implants: Secondary | ICD-10-CM | POA: Diagnosis not present

## 2018-01-31 DIAGNOSIS — C61 Malignant neoplasm of prostate: Secondary | ICD-10-CM | POA: Diagnosis not present

## 2018-01-31 DIAGNOSIS — N99114 Postprocedural urethral stricture, male, unspecified: Secondary | ICD-10-CM | POA: Diagnosis not present

## 2018-01-31 DIAGNOSIS — R3912 Poor urinary stream: Secondary | ICD-10-CM | POA: Diagnosis not present

## 2018-01-31 NOTE — Procedures (Signed)
   Patient Name: Antonio Clark, Antonio Clark Date: 01/24/2018 Gender: Male D.O.B: [birth year] Age (years): 80 Referring Provider: Virl Axe Height (inches): 18 Interpreting Physician: Fransico Him MD, ABSM Weight (lbs): 197 RPSGT: Jorge Ny BMI: 29 MRN: [id number] Neck Size: 16.50  CLINICAL INFORMATION The patient is referred for a CPAP titration to treat sleep apnea.  SLEEP STUDY TECHNIQUE As per the AASM Manual for the Scoring of Sleep and Associated Events v2.3 (April 2016) with a hypopnea requiring 4% desaturations.  The channels recorded and monitored were frontal, central and occipital EEG, electrooculogram (EOG), submentalis EMG (chin), nasal and oral airflow, thoracic and abdominal wall motion, anterior tibialis EMG, snore microphone, electrocardiogram, and pulse oximetry. Continuous positive airway pressure (CPAP) was initiated at the beginning of the study and titrated to treat sleep-disordered breathing.  MEDICATIONS Medications self-administered by patient taken the night of the study : N/A  TECHNICIAN COMMENTS Comments added by technician: NONE Comments added by scorer: N/A  RESPIRATORY PARAMETERS Optimal PAP Pressure (cm): N/A  AHI at Optimal Pressure (/hr):N/A Overall Minimal O2 (%):86.0  Supine % at Optimal Pressure (%):N/A Minimal O2 at Optimal Pressure (%): 86.0   SLEEP ARCHITECTURE The study was initiated at 10:26:14 PM and ended at 5:17:59 AM.  Sleep onset time was 41.7 minutes and the sleep efficiency was 57.8%%. The total sleep time was 238 minutes.  The patient spent 18.1%% of the night in stage N1 sleep, 67.2%% in stage N2 sleep, 0.0%% in stage N3 and 14.71% in REM.Stage REM latency was 104.0 minutes  Wake after sleep onset was 132.1. Alpha intrusion was absent. Supine sleep was 18.07%.  CARDIAC DATA The 2 lead EKG demonstrated sinus rhythm. The mean heart rate was 63.3 beats per minute. Other EKG findings include: None.  LEG MOVEMENT DATA The  total Periodic Limb Movements of Sleep (PLMS) were 0. The PLMS index was 0.0. A PLMS index of <15 is considered normal in adults.  IMPRESSIONS - An optimal PAP pressure could not be selected for this patient based on the available study data. - Central sleep apnea was not noted during this titration (CAI = 3.3/h). - Moderate oxygen desaturations were observed during this titration (min O2 = 86.0%). - The patient snored with soft snoring volume during this titration study. - No cardiac abnormalities were observed during this study. - Clinically significant periodic limb movements were not noted during this study. Arousals associated with PLMs were significant.  DIAGNOSIS - Obstructive Sleep Apnea (327.23 [G47.33 ICD-10])  RECOMMENDATIONS - Recommend BiPAP titration in lab as patient could not be adequately titrated on CPAP due to ongoing respiratory events.  - Avoid alcohol, sedatives and other CNS depressants that may worsen sleep apnea and disrupt normal sleep architecture. - Sleep hygiene should be reviewed to assess factors that may improve sleep quality. - Weight management and regular exercise should be initiated or continued.p  [Electronically signed] 01/31/2018 09:57 PM  Fransico Him MD, ABSM Diplomate, American Board of Sleep Medicine

## 2018-02-01 ENCOUNTER — Telehealth: Payer: Self-pay | Admitting: *Deleted

## 2018-02-01 DIAGNOSIS — G4733 Obstructive sleep apnea (adult) (pediatric): Secondary | ICD-10-CM

## 2018-02-01 NOTE — Telephone Encounter (Signed)
Bipap titration sent to sleep pool 

## 2018-02-01 NOTE — Telephone Encounter (Signed)
-----   Message from Sueanne Margarita, MD sent at 01/31/2018 10:00 PM EDT ----- Please let patient know that due to severity of OSA he could not be adequately titrated on CPAP.  Please set up BIPAP titration

## 2018-02-01 NOTE — Telephone Encounter (Signed)
Informed patient of sleep study results and patient understanding was verbalized. Patient understands his sleep study showed that due to severity of OSA he could not be adequately titrated on CPAP.  Please set up BIPAP titration. Pt is aware and agreeable to abnormal results.

## 2018-02-02 DIAGNOSIS — M720 Palmar fascial fibromatosis [Dupuytren]: Secondary | ICD-10-CM | POA: Diagnosis not present

## 2018-02-03 ENCOUNTER — Telehealth: Payer: Self-pay | Admitting: *Deleted

## 2018-02-03 NOTE — Telephone Encounter (Signed)
PA request sent to [facility] for BIPAP titration.

## 2018-02-08 ENCOUNTER — Telehealth: Payer: Self-pay | Admitting: *Deleted

## 2018-02-08 NOTE — Telephone Encounter (Signed)
-----   Message from Freada Bergeron, [place] sent at 02/01/2018  5:11 PM EDT ----- Regarding: pre cert Bipap titration

## 2018-02-08 NOTE — Telephone Encounter (Signed)
Staff message sent to [facility] approval received. Ok to schedule BIPAP titration. Approval information given to link appointment.

## 2018-02-09 NOTE — Telephone Encounter (Signed)
Patient is scheduled for lab study on 03/14/18. Patient understands his sleep study will be done at [facility] sleep lab. Patient understands he will receive a sleep packet in a week or so. Patient understands to call if he does not receive the sleep packet in a timely manner. Patient agrees with treatment and thanked me for call.

## 2018-02-11 NOTE — Telephone Encounter (Signed)
Referral assigned

## 2018-02-11 NOTE — Telephone Encounter (Signed)
  Lauralee Evener, CMA  Freada Bergeron, [place] approval received. Ok to schedule. auth # [id number] valid 02/03/18 to 08/09/18. Assign referral.     ----- Message -----  From: Freada Bergeron, CMA  Sent: 02/01/2018  5:11 PM  To: [facility]  Subject: pre cert                     Bipap titration

## 2018-03-10 ENCOUNTER — Encounter (HOSPITAL_BASED_OUTPATIENT_CLINIC_OR_DEPARTMENT_OTHER): Payer: Medicare Other

## 2018-03-11 DIAGNOSIS — C61 Malignant neoplasm of prostate: Secondary | ICD-10-CM | POA: Diagnosis not present

## 2018-03-11 DIAGNOSIS — N200 Calculus of kidney: Secondary | ICD-10-CM | POA: Diagnosis not present

## 2018-03-11 DIAGNOSIS — N99114 Postprocedural urethral stricture, male, unspecified: Secondary | ICD-10-CM | POA: Diagnosis not present

## 2018-03-11 DIAGNOSIS — N528 Other male erectile dysfunction: Secondary | ICD-10-CM | POA: Diagnosis not present

## 2018-03-11 DIAGNOSIS — N489 Disorder of penis, unspecified: Secondary | ICD-10-CM | POA: Diagnosis not present

## 2018-03-14 ENCOUNTER — Ambulatory Visit (HOSPITAL_BASED_OUTPATIENT_CLINIC_OR_DEPARTMENT_OTHER): Payer: Medicare Other | Attending: Cardiology | Admitting: Cardiology

## 2018-03-14 VITALS — Ht 69.0 in | Wt 202.0 lb

## 2018-03-14 DIAGNOSIS — G4733 Obstructive sleep apnea (adult) (pediatric): Secondary | ICD-10-CM | POA: Diagnosis not present

## 2018-03-15 NOTE — Procedures (Signed)
   Patient Name: Antonio Clark, Antonio Clark Date: 03/14/2018 Gender: Male D.O.B: 1935/10/12 Age (years): 6 Referring Provider: Virl Axe Height (inches): 74 Interpreting Physician: Fransico Him MD, ABSM Weight (lbs): 202 RPSGT: Zadie Rhine BMI: 30 MRN: [id number] Neck Size: 16.50  CLINICAL INFORMATION  The patient is referred for a BiPAP titration to treat sleep apnea.  SLEEP STUDY TECHNIQUE  As per the AASM Manual for the Scoring of Sleep and Associated Events v2.3 (April 2016) with a hypopnea requiring 4% desaturations. The channels recorded and monitored were frontal, central and occipital EEG, electrooculogram (EOG), submentalis EMG (chin), nasal and oral airflow, thoracic and abdominal wall motion, anterior tibialis EMG, snore microphone, electrocardiogram, and pulse oximetry. Bilevel positive airway pressure (BPAP) was initiated at the beginning of the study and titrated to treat sleep-disordered breathing.  MEDICATIONS  Medications self-administered by patient taken the night of the study : N/A  RESPIRATORY PARAMETERS  Optimal IPAP Pressure (cm):13 AHI at Optimal Pressure (/hr)0.0 Optimal EPAP Pressure (cm):9 Overall Minimal O2 (%):88.0 Minimal O2 at Optimal Pressure (%):93.0  SLEEP ARCHITECTURE  Start Time:9:50:29 PM Stop Time:4:02:27 AM Total Time (min):372 Total Sleep Time (min):242.5 Sleep Latency (min):42.5 Sleep Efficiency (%):65.2% REM Latency (min):96.0 WASO (min):87.0 Stage N1 (%):4.9% Stage N2 (%):84.1% Stage N3 (%):2.1% Stage R (%):8.9 Supine (%):0.87 Arousal Index (/hr):20.8  CARDIAC DATA  The 2 lead EKG demonstrated sinus rhythm. The mean heart rate was 60.8 beats per minute. Other EKG findings include: PVCs and PACs.  LEG MOVEMENT DATA  The total Periodic Limb Movements of Sleep (PLMS) were 0. The PLMS index was 0.0. A PLMS index of <15 is considered normal in adults.  IMPRESSIONS  - An optimal PAP pressure was selected for this patient ( 13/9  cm of water) - Central sleep apnea was not noted during this titration (CAI = 0.2/h). - Mild oxygen desaturations were observed during this titration (min O2 = 88.0%). - No snoring was audible during this study. - 2-lead EKG demonstrated: PVCs and PACs - Clinically significant periodic limb movements were not noted during this study. Arousals associated with PLMs were rare.  DIAGNOSIS  - Obstructive Sleep Apnea (327.23 [G47.33 ICD-10])  RECOMMENDATIONS  - Trial of BiPAP therapy on 13/9 cm H2O with a Small size Philips Respironics Full Face Mask Dreamwear mask and heated humidification. - Avoid alcohol, sedatives and other CNS depressants that may worsen sleep apnea and disrupt normal sleep architecture. - Sleep hygiene should be reviewed to assess factors that may improve sleep quality. - Weight management and regular exercise should be initiated or continued. - Return to [facility] Center for re-evaluation after 10 weeks of therapy  [Electronically signed] 03/15/2018 09:26 PM  Fransico Him MD, ABSM Diplomate, American Board of Sleep Medicine

## 2018-03-17 ENCOUNTER — Telehealth: Payer: Self-pay

## 2018-03-17 DIAGNOSIS — N529 Male erectile dysfunction, unspecified: Secondary | ICD-10-CM | POA: Diagnosis not present

## 2018-03-17 DIAGNOSIS — N489 Disorder of penis, unspecified: Secondary | ICD-10-CM | POA: Diagnosis not present

## 2018-03-17 DIAGNOSIS — E291 Testicular hypofunction: Secondary | ICD-10-CM | POA: Diagnosis not present

## 2018-03-17 DIAGNOSIS — N35914 Unspecified anterior urethral stricture, male: Secondary | ICD-10-CM | POA: Diagnosis not present

## 2018-03-17 DIAGNOSIS — R3912 Poor urinary stream: Secondary | ICD-10-CM | POA: Diagnosis not present

## 2018-03-17 DIAGNOSIS — Z8546 Personal history of malignant neoplasm of prostate: Secondary | ICD-10-CM | POA: Diagnosis not present

## 2018-03-17 NOTE — Telephone Encounter (Signed)
   [facility] Pre-operative Risk Assessment    Request for surgical clearance:  1. What type of surgery is being performed? Not provided  2. When is this surgery scheduled? 05/02/18  3. What type of clearance is required (medical clearance vs. Pharmacy clearance to hold med vs. Both)? Pharmacy clearance to hold med  4. Are there any medications that need to be held prior to surgery and how long? Pradaxa x 5 days   5. Practice name and name of physician performing surgery? Lenoria Chime, MD at [facility]   6. What is your office phone number [phone]   7.   What is your office fax number [phone]  8.   Anesthesia type (None, local, MAC, general) ? Not provided   Antonio Clark 03/17/2018, 4:40 PM  _________________________________________________________________   (provider comments below)  Fax request received did not include exactly what type of surgery is being performed or the anesthesia type.  VM left at [phone] advising that this request could not be processed until that information was received.    Please update request upon receipt of missing information

## 2018-03-17 NOTE — Telephone Encounter (Signed)
We will need to clarify what procedure will be performed to provide proper clearance.

## 2018-03-18 NOTE — Telephone Encounter (Addendum)
Pt takes Pradaxa for afib with CHADS2VASc score of 3 (age x2, HTN). Pt is pre diabetic with A1c of 5.7 but not taking any meds for this. CAD shows on PMH however pt only has a family history of this. CrCl is 1mL/min. Recommend holding Pradaxa for 3 days per protocol as this will be sufficient to clear Pradaxa prior to procedure.

## 2018-03-18 NOTE — Telephone Encounter (Signed)
Follow up    Antonio Clark returning call to provide the additional information for clearance          [facility] Pre-operative Risk Assessment    Request for surgical clearance:  1. What type of surgery is being performed? Laser of the ureteral stricture, biopsy of an aligment on the penis gland, work on penis implant  2. When is this surgery scheduled? 05/02/2018   3. What type of clearance is required (medical clearance vs. Pharmacy clearance to hold med vs. Both)? Pharmacy  4. Are there any medications that need to be held prior to surgery and how long? Pradaxa hold for 5 days  5. Practice name and name of physician performing surgery? Lenoria Chime, MD at [facility]   6. What is your office phone number [phone]    7.   What is your office fax number [phone]  8.   Anesthesia type (None, local, MAC, general) ? Not provided    Antonio Clark 03/18/2018, 8:52 AM  _________________________________________________________________   (provider comments below)

## 2018-03-21 NOTE — Telephone Encounter (Signed)
Informed patient of sleep study results and patient understanding was verbalized. Patient understands his sleep study showed they had a successful PAP titration and orders are in Epic. Upon patient request DME selection is [facility]. Patient understands he will be contacted by [facility] to set up his cpap. Patient understands to call if [facility] does not contact him with new setup in a timely manner. Patient understands they will be called once confirmation has been received from [facility] that they have received their new machine to schedule 10 week follow up appointment.  [facility] notified of new cpap order  Please add to airview Patient was grateful for the call and thanked me. 

## 2018-03-21 NOTE — Telephone Encounter (Signed)
Routing to requested provider.   Burtis Junes, RN, [facility] [address] [place], [place]  [address] [phone].

## 2018-04-13 ENCOUNTER — Other Ambulatory Visit: Payer: Self-pay | Admitting: Family Medicine

## 2018-04-13 DIAGNOSIS — Z79899 Other long term (current) drug therapy: Secondary | ICD-10-CM

## 2018-04-13 DIAGNOSIS — I10 Essential (primary) hypertension: Secondary | ICD-10-CM

## 2018-04-13 DIAGNOSIS — E7800 Pure hypercholesterolemia, unspecified: Secondary | ICD-10-CM

## 2018-04-22 DIAGNOSIS — K219 Gastro-esophageal reflux disease without esophagitis: Secondary | ICD-10-CM | POA: Diagnosis not present

## 2018-04-22 DIAGNOSIS — I4891 Unspecified atrial fibrillation: Secondary | ICD-10-CM | POA: Diagnosis not present

## 2018-04-22 DIAGNOSIS — C61 Malignant neoplasm of prostate: Secondary | ICD-10-CM | POA: Diagnosis not present

## 2018-04-22 DIAGNOSIS — N489 Disorder of penis, unspecified: Secondary | ICD-10-CM | POA: Diagnosis not present

## 2018-04-22 DIAGNOSIS — Z01810 Encounter for preprocedural cardiovascular examination: Secondary | ICD-10-CM | POA: Diagnosis not present

## 2018-04-22 DIAGNOSIS — Z79899 Other long term (current) drug therapy: Secondary | ICD-10-CM | POA: Diagnosis not present

## 2018-04-22 DIAGNOSIS — Z8546 Personal history of malignant neoplasm of prostate: Secondary | ICD-10-CM | POA: Diagnosis not present

## 2018-04-22 DIAGNOSIS — Z960 Presence of urogenital implants: Secondary | ICD-10-CM | POA: Diagnosis not present

## 2018-04-22 DIAGNOSIS — N35919 Unspecified urethral stricture, male, unspecified site: Secondary | ICD-10-CM | POA: Diagnosis not present

## 2018-04-22 DIAGNOSIS — Z923 Personal history of irradiation: Secondary | ICD-10-CM | POA: Diagnosis not present

## 2018-04-22 DIAGNOSIS — N529 Male erectile dysfunction, unspecified: Secondary | ICD-10-CM | POA: Diagnosis not present

## 2018-04-22 DIAGNOSIS — E785 Hyperlipidemia, unspecified: Secondary | ICD-10-CM | POA: Diagnosis not present

## 2018-04-22 DIAGNOSIS — G4733 Obstructive sleep apnea (adult) (pediatric): Secondary | ICD-10-CM | POA: Diagnosis not present

## 2018-04-22 DIAGNOSIS — N509 Disorder of male genital organs, unspecified: Secondary | ICD-10-CM | POA: Diagnosis not present

## 2018-04-22 DIAGNOSIS — I10 Essential (primary) hypertension: Secondary | ICD-10-CM | POA: Diagnosis not present

## 2018-04-22 DIAGNOSIS — N99114 Postprocedural urethral stricture, male, unspecified: Secondary | ICD-10-CM | POA: Diagnosis not present

## 2018-04-29 ENCOUNTER — Other Ambulatory Visit: Payer: Medicare Other

## 2018-04-29 DIAGNOSIS — I10 Essential (primary) hypertension: Secondary | ICD-10-CM | POA: Diagnosis not present

## 2018-04-29 DIAGNOSIS — Z79899 Other long term (current) drug therapy: Secondary | ICD-10-CM

## 2018-04-29 DIAGNOSIS — E7800 Pure hypercholesterolemia, unspecified: Secondary | ICD-10-CM

## 2018-04-29 DIAGNOSIS — R7309 Other abnormal glucose: Secondary | ICD-10-CM | POA: Diagnosis not present

## 2018-05-02 DIAGNOSIS — N509 Disorder of male genital organs, unspecified: Secondary | ICD-10-CM | POA: Diagnosis not present

## 2018-05-02 DIAGNOSIS — L438 Other lichen planus: Secondary | ICD-10-CM | POA: Diagnosis not present

## 2018-05-02 DIAGNOSIS — I10 Essential (primary) hypertension: Secondary | ICD-10-CM | POA: Diagnosis not present

## 2018-05-02 DIAGNOSIS — N489 Disorder of penis, unspecified: Secondary | ICD-10-CM | POA: Diagnosis not present

## 2018-05-02 DIAGNOSIS — N35919 Unspecified urethral stricture, male, unspecified site: Secondary | ICD-10-CM | POA: Diagnosis not present

## 2018-05-02 DIAGNOSIS — Z8546 Personal history of malignant neoplasm of prostate: Secondary | ICD-10-CM | POA: Diagnosis not present

## 2018-05-02 DIAGNOSIS — N35911 Unspecified urethral stricture, male, meatal: Secondary | ICD-10-CM | POA: Diagnosis not present

## 2018-05-02 DIAGNOSIS — E785 Hyperlipidemia, unspecified: Secondary | ICD-10-CM | POA: Diagnosis not present

## 2018-05-02 DIAGNOSIS — I4891 Unspecified atrial fibrillation: Secondary | ICD-10-CM | POA: Diagnosis not present

## 2018-05-02 DIAGNOSIS — N4889 Other specified disorders of penis: Secondary | ICD-10-CM | POA: Diagnosis not present

## 2018-05-03 LAB — TEST AUTHORIZATION

## 2018-05-03 LAB — CBC WITH DIFFERENTIAL/PLATELET
Basophils Absolute: 38 cells/uL (ref 0–200)
Basophils Relative: 0.8 %
EOS PCT: 6.9 %
Eosinophils Absolute: 331 cells/uL (ref 15–500)
HCT: 38.9 % (ref 38.5–50.0)
Hemoglobin: 13.5 g/dL (ref 13.2–17.1)
Lymphs Abs: 1339 cells/uL (ref 850–3900)
MCH: 30.3 pg (ref 27.0–33.0)
MCHC: 34.7 g/dL (ref 32.0–36.0)
MCV: 87.2 fL (ref 80.0–100.0)
MPV: 9.4 fL (ref 7.5–12.5)
Monocytes Relative: 9.2 %
NEUTROS PCT: 55.2 %
Neutro Abs: 2650 cells/uL (ref 1500–7800)
PLATELETS: 138 10*3/uL — AB (ref 140–400)
RBC: 4.46 10*6/uL (ref 4.20–5.80)
RDW: 13 % (ref 11.0–15.0)
TOTAL LYMPHOCYTE: 27.9 %
WBC mixed population: 442 cells/uL (ref 200–950)
WBC: 4.8 10*3/uL (ref 3.8–10.8)

## 2018-05-03 LAB — LIPID PANEL
CHOLESTEROL: 140 mg/dL (ref <200)
HDL: 44 mg/dL (ref >40)
LDL Cholesterol (Calc): 72 mg/dL (calc)
Non-HDL Cholesterol (Calc): 96 mg/dL (calc) (ref <130)
Total CHOL/HDL Ratio: 3.2 (calc) (ref <5.0)
Triglycerides: 158 mg/dL — ABNORMAL HIGH (ref <150)

## 2018-05-03 LAB — COMPREHENSIVE METABOLIC PANEL
AG RATIO: 2.3 (calc) (ref 1.0–2.5)
ALBUMIN MSPROF: 4.6 g/dL (ref 3.6–5.1)
ALT: 16 U/L (ref 9–46)
AST: 18 U/L (ref 10–35)
Alkaline phosphatase (APISO): 52 U/L (ref 40–115)
BILIRUBIN TOTAL: 0.8 mg/dL (ref 0.2–1.2)
BUN/Creatinine Ratio: 18 (calc) (ref 6–22)
BUN: 21 mg/dL (ref 7–25)
CALCIUM: 9.6 mg/dL (ref 8.6–10.3)
CHLORIDE: 103 mmol/L (ref 98–110)
CO2: 26 mmol/L (ref 20–32)
Creat: 1.14 mg/dL — ABNORMAL HIGH (ref 0.70–1.11)
Globulin: 2 g/dL (calc) (ref 1.9–3.7)
Glucose, Bld: 168 mg/dL — ABNORMAL HIGH (ref 65–99)
POTASSIUM: 4.5 mmol/L (ref 3.5–5.3)
Sodium: 139 mmol/L (ref 135–146)
TOTAL PROTEIN: 6.6 g/dL (ref 6.1–8.1)

## 2018-05-03 LAB — HEMOGLOBIN A1C W/OUT EAG: Hgb A1c MFr Bld: 6.3 % of total Hgb — ABNORMAL HIGH (ref <5.7)

## 2018-05-04 DIAGNOSIS — R339 Retention of urine, unspecified: Secondary | ICD-10-CM | POA: Diagnosis not present

## 2018-05-04 DIAGNOSIS — Z466 Encounter for fitting and adjustment of urinary device: Secondary | ICD-10-CM | POA: Diagnosis not present

## 2018-05-10 LAB — CBC WITH DIFFERENTIAL/PLATELET
Basophils Absolute: 50 cells/uL (ref 0–200)
Basophils Relative: 0.9 %
EOS PCT: 8.2 %
Eosinophils Absolute: 459 cells/uL (ref 15–500)
HEMATOCRIT: 39.3 % (ref 38.5–50.0)
HEMOGLOBIN: 13.7 g/dL (ref 13.2–17.1)
LYMPHS ABS: 1467 {cells}/uL (ref 850–3900)
MCH: 30.4 pg (ref 27.0–33.0)
MCHC: 34.9 g/dL (ref 32.0–36.0)
MCV: 87.1 fL (ref 80.0–100.0)
MPV: 9.2 fL (ref 7.5–12.5)
Monocytes Relative: 10 %
Neutro Abs: 3063 cells/uL (ref 1500–7800)
Neutrophils Relative %: 54.7 %
Platelets: 132 10*3/uL — ABNORMAL LOW (ref 140–400)
RBC: 4.51 10*6/uL (ref 4.20–5.80)
RDW: 13.1 % (ref 11.0–15.0)
Total Lymphocyte: 26.2 %
WBC: 5.6 10*3/uL (ref 3.8–10.8)
WBCMIX: 560 {cells}/uL (ref 200–950)

## 2018-05-10 LAB — COMPREHENSIVE METABOLIC PANEL
AG RATIO: 1.9 (calc) (ref 1.0–2.5)
ALBUMIN MSPROF: 4.5 g/dL (ref 3.6–5.1)
ALT: 16 U/L (ref 9–46)
AST: 16 U/L (ref 10–35)
Alkaline phosphatase (APISO): 50 U/L (ref 40–115)
BILIRUBIN TOTAL: 0.6 mg/dL (ref 0.2–1.2)
BUN: 19 mg/dL (ref 7–25)
CALCIUM: 9.7 mg/dL (ref 8.6–10.3)
CHLORIDE: 101 mmol/L (ref 98–110)
CO2: 27 mmol/L (ref 20–32)
Creat: 0.96 mg/dL (ref 0.70–1.11)
GLOBULIN: 2.4 g/dL (ref 1.9–3.7)
Glucose, Bld: 126 mg/dL — ABNORMAL HIGH (ref 65–99)
Potassium: 4.1 mmol/L (ref 3.5–5.3)
SODIUM: 137 mmol/L (ref 135–146)
TOTAL PROTEIN: 6.9 g/dL (ref 6.1–8.1)

## 2018-05-10 LAB — LIPID PANEL
CHOL/HDL RATIO: 3.7 (calc) (ref <5.0)
Cholesterol: 168 mg/dL (ref <200)
HDL: 45 mg/dL (ref >40)
LDL CHOLESTEROL (CALC): 85 mg/dL
Non-HDL Cholesterol (Calc): 123 mg/dL (calc) (ref <130)
Triglycerides: 312 mg/dL — ABNORMAL HIGH (ref <150)

## 2018-05-27 ENCOUNTER — Telehealth: Payer: Self-pay | Admitting: *Deleted

## 2018-05-27 NOTE — Telephone Encounter (Signed)
Patient called stating he can only wear his cpap for 2 hours and then it starts stopping up his head and sinuses. Please advise,

## 2018-05-31 DIAGNOSIS — H35071 Retinal telangiectasis, right eye: Secondary | ICD-10-CM | POA: Diagnosis not present

## 2018-05-31 NOTE — Telephone Encounter (Signed)
Patient has been advised to use nasal saline spray twice daily and find out if there is condensation in the mask or tubing. Pt is aware and agreeable to this treatment.

## 2018-05-31 NOTE — Telephone Encounter (Signed)
  Antonio Margarita, MD  Freada Bergeron, CMA        Please have him use saline nasal spray twice daily and find out if there is condensation in the mask or tubing   Traci

## 2018-06-01 NOTE — Telephone Encounter (Signed)
Patient has expressed to [facility] that he feels his pressure is too strong at 13/9. Anderson Malta says she would be happy to change to auto Bilevel if you would send the perimeters.

## 2018-06-02 NOTE — Telephone Encounter (Signed)
Faxed today to [facility]

## 2018-06-02 NOTE — Telephone Encounter (Signed)
  Antonio Margarita, MD  Freada Bergeron, CMA        Auto BIPAP is fine with IPAP max 62m H2O and EPAP min 6cm H2O with PS of 5cm H2O and get a download in 2 weeks   Fransico Him

## 2018-06-22 ENCOUNTER — Other Ambulatory Visit: Payer: Self-pay | Admitting: Dermatology

## 2018-06-22 DIAGNOSIS — L570 Actinic keratosis: Secondary | ICD-10-CM | POA: Diagnosis not present

## 2018-06-22 DIAGNOSIS — D0462 Carcinoma in situ of skin of left upper limb, including shoulder: Secondary | ICD-10-CM | POA: Diagnosis not present

## 2018-07-04 ENCOUNTER — Ambulatory Visit (INDEPENDENT_AMBULATORY_CARE_PROVIDER_SITE_OTHER): Payer: Medicare Other | Admitting: Nurse Practitioner

## 2018-07-04 ENCOUNTER — Encounter: Payer: Self-pay | Admitting: Nurse Practitioner

## 2018-07-04 VITALS — BP 158/82 | HR 70 | Ht 69.0 in | Wt 202.0 lb

## 2018-07-04 DIAGNOSIS — E7800 Pure hypercholesterolemia, unspecified: Secondary | ICD-10-CM

## 2018-07-04 DIAGNOSIS — I10 Essential (primary) hypertension: Secondary | ICD-10-CM | POA: Diagnosis not present

## 2018-07-04 DIAGNOSIS — Z79899 Other long term (current) drug therapy: Secondary | ICD-10-CM | POA: Diagnosis not present

## 2018-07-04 DIAGNOSIS — G4733 Obstructive sleep apnea (adult) (pediatric): Secondary | ICD-10-CM | POA: Diagnosis not present

## 2018-07-04 DIAGNOSIS — I480 Paroxysmal atrial fibrillation: Secondary | ICD-10-CM | POA: Diagnosis not present

## 2018-07-04 NOTE — Progress Notes (Signed)
CARDIOLOGY OFFICE NOTE  Date:  07/04/2018    Antonio Clark Date of Birth: 1936/05/01 Medical Record #[id number]  PCP:  Susy Frizzle, MD  Cardiologist:  [facility]  Chief Complaint  Patient presents with  . Atrial Fibrillation    6 month check - seen for Dr. Marlou Porch    History of Present Illness: Antonio Clark is a 82 y.o. male who presents today for a follow up visit. Seen for Dr. Marlou Porch. Formerly seen by Dr. Aundra Dubin as well as Dr. Susa Simmonds.   I previously took care of both his brother and his sister in law Data processing manager and Janson Lamar).   He has a history of HTN and HLD. Strong FH for CAD. Remote Cardiolite in 2013 was normal.   Seen back in May - had had AF while in the Urology office. Found to have OSA - but CPAP made him claustrophobic. Placed on Eliquis - changed to Pradaxa due to insurance coverage.   Comes in today. Here alone. He has been doing well. No AF that he is aware of. He had to have some adjustments of his CPAP - this is better for him and he is able to tolerate better. No chest pain. Breathing is good. Not dizzy. No falls. He walks 3 miles a day. Labs from PCP from September noted - has become borderline diabetic. BP typically lower at home.   Past Medical History:  Diagnosis Date  . Atrial fibrillation ([place])   . Borderline diabetic   . History of echocardiogram 07/ 07/ 2011  . History of lithotripsy 1989  . Hyperlipidemia   . Hypertension   . Prostate CA ([place]) 2010    Past Surgical History:  Procedure Laterality Date  . COLONOSCOPY  2002  . INSERTION PROSTATE RADIATION SEED  8 4 2010   per Dr Rosana Hoes  . POLYPECTOMY  2002  . THROAT SURGERY  1980   benign cyst  . URETHRAL STRICTURE DILATATION     also penile implant     Medications: Current Meds  Medication Sig  . dabigatran (PRADAXA) 150 MG CAPS capsule Take 1 capsule (150 mg total) by mouth 2 (two) times daily.  Marland Kitchen losartan-hydrochlorothiazide (HYZAAR) 100-12.5 MG tablet Take 1 tablet by mouth  daily.  . multivitamin (THERAGRAN) per tablet Take 1 tablet by mouth daily.    . Omega-3 Fatty Acids (FISH OIL) 1200 MG CAPS Take 1,200 mg by mouth daily.    Marland Kitchen omeprazole (PRILOSEC) 20 MG capsule Take 1 capsule (20 mg total) by mouth daily.  . pravastatin (PRAVACHOL) 40 MG tablet TAKE 1 TABLET BY MOUTH EVERY DAY IN THE EVENING  . Tamsulosin HCl (FLOMAX) 0.4 MG CAPS Take 0.4 mg by mouth daily.       Allergies: No Known Allergies  Social History: The patient  reports that he has never smoked. He has never used smokeless tobacco. He reports that he does not drink alcohol or use drugs.   Family History: The patient's family history includes Arrhythmia in his brother; Cancer in his mother; Colon cancer (age of onset: 74) in his mother; Coronary artery disease in his brother; Diabetes in his brother and brother; Hearing loss in his brother; Heart disease in his brother and father; Heart failure in his brother; Hyperlipidemia in his sister; Hypertension in his brother, brother, brother, and father; Stroke in his mother and sister; Sudden death in his father; Suicidality in his sister.   Review of Systems: Please see the history of present  illness.   Otherwise, the review of systems is positive for none.   All other systems are reviewed and negative.   Physical Exam: VS:  BP (!) 158/82 (BP Location: Left Arm, Patient Position: Sitting, Cuff Size: Normal)   Pulse 70   Ht 5\' 9"  (1.753 m)   Wt 202 lb (91.6 kg)   SpO2 99% Comment: at rest  BMI 29.83 kg/m  .  BMI Body mass index is 29.83 kg/m.  Wt Readings from Last 3 Encounters:  07/04/18 202 lb (91.6 kg)  03/14/18 202 lb (91.6 kg)  01/28/18 200 lb (90.7 kg)    General: Pleasant. Alert. He is in no acute distress.   HEENT: Normal.  Neck: Supple, no JVD, carotid bruits, or masses noted.  Cardiac: Regular rate and rhythm. No murmurs, rubs, or gallops. No edema.  Respiratory:  Lungs are clear to auscultation bilaterally with normal work of  breathing.  GI: Soft and nontender.  MS: No deformity or atrophy. Gait and ROM intact.  Skin: Warm and dry. Color is normal.  Neuro:  Strength and sensation are intact and no gross focal deficits noted.  Psych: Alert, appropriate and with normal affect.   LABORATORY DATA:  EKG:  EKG is not ordered today.  Lab Results  Component Value Date   WBC 4.8 04/29/2018   HGB 13.5 04/29/2018   HCT 38.9 04/29/2018   PLT 138 (L) 04/29/2018   GLUCOSE 168 (H) 04/29/2018   CHOL 140 04/29/2018   TRIG 158 (H) 04/29/2018   HDL 44 04/29/2018   LDLCALC 72 04/29/2018   ALT 16 04/29/2018   AST 18 04/29/2018   NA 139 04/29/2018   K 4.5 04/29/2018   CL 103 04/29/2018   CREATININE 1.14 (H) 04/29/2018   BUN 21 04/29/2018   CO2 26 04/29/2018   INR 1.0 03/06/2009   HGBA1C 6.3 (H) 04/29/2018     BNP (last 3 results) No results for input(s): BNP in the last 8760 hours.  ProBNP (last 3 results) No results for input(s): PROBNP in the last 8760 hours.   Other Studies Reviewed Today:  11/05/2017- Echocardiogram - Left ventricle: The cavity size was normal. There was mild focal basal hypertrophy of the septum. Systolic function was normal. The estimated ejection fraction was in the range of 50% to 55%. Wall motion was normal; there were no regional wall motion abnormalities. Features are consistent with a pseudonormal left ventricular filling pattern, with concomitant abnormal relaxation and increased filling pressure (grade 2 diastolic dysfunction). - Aortic valve: Trileaflet; moderately thickened, mildly calcified leaflets. There was mild regurgitation. Regurgitation pressure half-time: 701 ms. - Aorta: Aortic root dimension: 38 mm (ED). - Aortic root: The aortic root was mildly dilated. - Mitral valve: Calcified annulus. There was trivial regurgitation. - Left atrium: The atrium was moderately dilated. - Right ventricle: The cavity size was mildly dilated. Wall thickness  was normal. - Atrial septum: There was increased thickness of the septum, consistent with lipomatous hypertrophy. - Tricuspid valve: There was trivial regurgitation. - Pulmonic valve: There was trivial regurgitation.  Assessment/Plan:  1. PAF - remains in NSR - remains on anticoagulation. Surveillance labs from September noted.   2. FH + CAD - CV risk factor modification advised. He has no active symptoms.   3. OSA - on CPAP - tolerating better after adjustments.   4. Chronic anticoagulation - no problems noted.   5. HTN - recheck by me is 128/80  6. HLD - on statin therapy.   7.  Borderline DM - following with PCP  Current medicines are reviewed with the patient today.  The patient does not have concerns regarding medicines other than what has been noted above.  The following changes have been made:  See above.  Labs/ tests ordered today include:   No orders of the defined types were placed in this encounter.    Disposition:   FU with me in 6 months.    Patient is agreeable to this plan and will call if any problems develop in the interim.   SignedTruitt Merle, NP  07/04/2018 9:03 AM  [facility] [address] [place], [place]  [address] Phone: [phone] Fax: [phone]

## 2018-07-04 NOTE — Patient Instructions (Addendum)
We will be checking the following labs today - NONE    Medication Instructions:    Continue with your current medicines.    If you need a refill on your cardiac medications before your next appointment, please call your pharmacy.     Testing/Procedures To Be Arranged:  N/A  Follow-Up:   See me in 6 months    At [facility], you and your health needs are our priority.  As part of our continuing mission to provide you with exceptional heart care, we have created designated Provider Care Teams.  These Care Teams include your primary Cardiologist (physician) and Advanced Practice Providers (APPs -  Physician Assistants and Nurse Practitioners) who all work together to provide you with the care you need, when you need it.  Special Instructions:  . None  Call the [facility] office at [phone] if you have any questions, problems or concerns.       

## 2018-07-18 ENCOUNTER — Other Ambulatory Visit: Payer: Self-pay | Admitting: *Deleted

## 2018-07-18 MED ORDER — LOSARTAN POTASSIUM 100 MG PO TABS: 100.0000 mg | ORAL_TABLET | Freq: Every day | ORAL | 2 refills | Status: DC

## 2018-07-18 MED ORDER — HYDROCHLOROTHIAZIDE 25 MG PO TABS: 12.5000 mg | ORAL_TABLET | Freq: Every day | ORAL | 2 refills | Status: DC

## 2018-07-18 NOTE — Progress Notes (Signed)
Verbal order given to change from combo drug to losartan 100 HCTZ 12.5 mg #90 X 2

## 2018-08-01 ENCOUNTER — Other Ambulatory Visit: Payer: Medicare Other

## 2018-08-01 DIAGNOSIS — E7800 Pure hypercholesterolemia, unspecified: Secondary | ICD-10-CM

## 2018-08-01 DIAGNOSIS — I10 Essential (primary) hypertension: Secondary | ICD-10-CM

## 2018-08-01 DIAGNOSIS — R7303 Prediabetes: Secondary | ICD-10-CM | POA: Diagnosis not present

## 2018-08-02 LAB — LIPID PANEL
CHOL/HDL RATIO: 2.9 (calc) (ref <5.0)
CHOLESTEROL: 123 mg/dL (ref <200)
HDL: 43 mg/dL (ref >40)
LDL Cholesterol (Calc): 60 mg/dL (calc)
Non-HDL Cholesterol (Calc): 80 mg/dL (calc) (ref <130)
Triglycerides: 110 mg/dL (ref <150)

## 2018-08-02 LAB — CBC WITH DIFFERENTIAL/PLATELET
Absolute Monocytes: 584 cells/uL (ref 200–950)
Basophils Absolute: 29 cells/uL (ref 0–200)
Basophils Relative: 0.7 %
EOS ABS: 290 {cells}/uL (ref 15–500)
Eosinophils Relative: 6.9 %
HCT: 37.5 % — ABNORMAL LOW (ref 38.5–50.0)
HEMOGLOBIN: 12.4 g/dL — AB (ref 13.2–17.1)
Lymphs Abs: 853 cells/uL (ref 850–3900)
MCH: 29.6 pg (ref 27.0–33.0)
MCHC: 33.1 g/dL (ref 32.0–36.0)
MCV: 89.5 fL (ref 80.0–100.0)
MONOS PCT: 13.9 %
MPV: 9.4 fL (ref 7.5–12.5)
NEUTROS ABS: 2444 {cells}/uL (ref 1500–7800)
Neutrophils Relative %: 58.2 %
Platelets: 135 10*3/uL — ABNORMAL LOW (ref 140–400)
RBC: 4.19 10*6/uL — AB (ref 4.20–5.80)
RDW: 13 % (ref 11.0–15.0)
Total Lymphocyte: 20.3 %
WBC: 4.2 10*3/uL (ref 3.8–10.8)

## 2018-08-02 LAB — COMPREHENSIVE METABOLIC PANEL
AG Ratio: 2.1 (calc) (ref 1.0–2.5)
ALBUMIN MSPROF: 4.2 g/dL (ref 3.6–5.1)
ALT: 16 U/L (ref 9–46)
AST: 18 U/L (ref 10–35)
Alkaline phosphatase (APISO): 50 U/L (ref 40–115)
BILIRUBIN TOTAL: 0.5 mg/dL (ref 0.2–1.2)
BUN: 14 mg/dL (ref 7–25)
CALCIUM: 9.1 mg/dL (ref 8.6–10.3)
CHLORIDE: 107 mmol/L (ref 98–110)
CO2: 26 mmol/L (ref 20–32)
Creat: 1 mg/dL (ref 0.70–1.11)
GLOBULIN: 2 g/dL (ref 1.9–3.7)
Glucose, Bld: 140 mg/dL — ABNORMAL HIGH (ref 65–99)
POTASSIUM: 5 mmol/L (ref 3.5–5.3)
SODIUM: 141 mmol/L (ref 135–146)
TOTAL PROTEIN: 6.2 g/dL (ref 6.1–8.1)

## 2018-08-02 LAB — HEMOGLOBIN A1C
EAG (MMOL/L): 7 (calc)
HEMOGLOBIN A1C: 6 %{Hb} — AB (ref <5.7)
Mean Plasma Glucose: 126 (calc)

## 2018-08-08 ENCOUNTER — Encounter: Payer: Self-pay | Admitting: Family Medicine

## 2018-08-08 DIAGNOSIS — M9903 Segmental and somatic dysfunction of lumbar region: Secondary | ICD-10-CM | POA: Diagnosis not present

## 2018-08-08 DIAGNOSIS — M5416 Radiculopathy, lumbar region: Secondary | ICD-10-CM | POA: Diagnosis not present

## 2018-08-08 DIAGNOSIS — M5136 Other intervertebral disc degeneration, lumbar region: Secondary | ICD-10-CM | POA: Diagnosis not present

## 2018-08-08 DIAGNOSIS — M9905 Segmental and somatic dysfunction of pelvic region: Secondary | ICD-10-CM | POA: Diagnosis not present

## 2018-09-21 DIAGNOSIS — M9903 Segmental and somatic dysfunction of lumbar region: Secondary | ICD-10-CM | POA: Diagnosis not present

## 2018-09-21 DIAGNOSIS — M5416 Radiculopathy, lumbar region: Secondary | ICD-10-CM | POA: Diagnosis not present

## 2018-09-21 DIAGNOSIS — M9905 Segmental and somatic dysfunction of pelvic region: Secondary | ICD-10-CM | POA: Diagnosis not present

## 2018-09-21 DIAGNOSIS — M5136 Other intervertebral disc degeneration, lumbar region: Secondary | ICD-10-CM | POA: Diagnosis not present

## 2018-09-26 DIAGNOSIS — M5416 Radiculopathy, lumbar region: Secondary | ICD-10-CM | POA: Diagnosis not present

## 2018-09-26 DIAGNOSIS — M9905 Segmental and somatic dysfunction of pelvic region: Secondary | ICD-10-CM | POA: Diagnosis not present

## 2018-09-26 DIAGNOSIS — M9903 Segmental and somatic dysfunction of lumbar region: Secondary | ICD-10-CM | POA: Diagnosis not present

## 2018-09-26 DIAGNOSIS — M5136 Other intervertebral disc degeneration, lumbar region: Secondary | ICD-10-CM | POA: Diagnosis not present

## 2018-09-28 DIAGNOSIS — M9903 Segmental and somatic dysfunction of lumbar region: Secondary | ICD-10-CM | POA: Diagnosis not present

## 2018-09-28 DIAGNOSIS — M5136 Other intervertebral disc degeneration, lumbar region: Secondary | ICD-10-CM | POA: Diagnosis not present

## 2018-09-28 DIAGNOSIS — M9905 Segmental and somatic dysfunction of pelvic region: Secondary | ICD-10-CM | POA: Diagnosis not present

## 2018-09-28 DIAGNOSIS — M5416 Radiculopathy, lumbar region: Secondary | ICD-10-CM | POA: Diagnosis not present

## 2018-10-03 ENCOUNTER — Other Ambulatory Visit: Payer: Self-pay | Admitting: Cardiology

## 2018-10-03 DIAGNOSIS — M5136 Other intervertebral disc degeneration, lumbar region: Secondary | ICD-10-CM | POA: Diagnosis not present

## 2018-10-03 DIAGNOSIS — M5416 Radiculopathy, lumbar region: Secondary | ICD-10-CM | POA: Diagnosis not present

## 2018-10-03 DIAGNOSIS — M9905 Segmental and somatic dysfunction of pelvic region: Secondary | ICD-10-CM | POA: Diagnosis not present

## 2018-10-03 DIAGNOSIS — M9903 Segmental and somatic dysfunction of lumbar region: Secondary | ICD-10-CM | POA: Diagnosis not present

## 2018-10-05 DIAGNOSIS — M9903 Segmental and somatic dysfunction of lumbar region: Secondary | ICD-10-CM | POA: Diagnosis not present

## 2018-10-05 DIAGNOSIS — M5136 Other intervertebral disc degeneration, lumbar region: Secondary | ICD-10-CM | POA: Diagnosis not present

## 2018-10-05 DIAGNOSIS — M5416 Radiculopathy, lumbar region: Secondary | ICD-10-CM | POA: Diagnosis not present

## 2018-10-05 DIAGNOSIS — M9905 Segmental and somatic dysfunction of pelvic region: Secondary | ICD-10-CM | POA: Diagnosis not present

## 2018-10-10 DIAGNOSIS — M9905 Segmental and somatic dysfunction of pelvic region: Secondary | ICD-10-CM | POA: Diagnosis not present

## 2018-10-10 DIAGNOSIS — M5416 Radiculopathy, lumbar region: Secondary | ICD-10-CM | POA: Diagnosis not present

## 2018-10-10 DIAGNOSIS — M5136 Other intervertebral disc degeneration, lumbar region: Secondary | ICD-10-CM | POA: Diagnosis not present

## 2018-10-10 DIAGNOSIS — M9903 Segmental and somatic dysfunction of lumbar region: Secondary | ICD-10-CM | POA: Diagnosis not present

## 2018-10-12 DIAGNOSIS — M9905 Segmental and somatic dysfunction of pelvic region: Secondary | ICD-10-CM | POA: Diagnosis not present

## 2018-10-12 DIAGNOSIS — M9903 Segmental and somatic dysfunction of lumbar region: Secondary | ICD-10-CM | POA: Diagnosis not present

## 2018-10-12 DIAGNOSIS — M5136 Other intervertebral disc degeneration, lumbar region: Secondary | ICD-10-CM | POA: Diagnosis not present

## 2018-10-12 DIAGNOSIS — M5416 Radiculopathy, lumbar region: Secondary | ICD-10-CM | POA: Diagnosis not present

## 2018-10-14 ENCOUNTER — Telehealth: Payer: Self-pay | Admitting: Cardiology

## 2018-10-14 NOTE — Telephone Encounter (Signed)
Per Pt's pharmacist, losartan is on backorder at this time and the pt is completely out of stock. [facility] pharmacist would like to know if he could substitute with Valsartan or Olmesartan.   I will forward to Dr Marlou Porch for further recommendation.

## 2018-10-14 NOTE — Telephone Encounter (Signed)
° ° °  Pt c/o medication issue:  1. Name of Medication: losartan (COZAAR) 100 MG tablet  2. How are you currently taking this medication (dosage and times per day)? n/a  3. Are you having a reaction (difficulty breathing--STAT)? no  4. What is your medication issue? Out of stock - prescribe different  med

## 2018-10-17 ENCOUNTER — Other Ambulatory Visit: Payer: Self-pay

## 2018-10-17 DIAGNOSIS — M9903 Segmental and somatic dysfunction of lumbar region: Secondary | ICD-10-CM | POA: Diagnosis not present

## 2018-10-17 DIAGNOSIS — M9905 Segmental and somatic dysfunction of pelvic region: Secondary | ICD-10-CM | POA: Diagnosis not present

## 2018-10-17 DIAGNOSIS — M5416 Radiculopathy, lumbar region: Secondary | ICD-10-CM | POA: Diagnosis not present

## 2018-10-17 DIAGNOSIS — M5136 Other intervertebral disc degeneration, lumbar region: Secondary | ICD-10-CM | POA: Diagnosis not present

## 2018-10-17 MED ORDER — LOSARTAN POTASSIUM 100 MG PO TABS: 100.0000 mg | ORAL_TABLET | Freq: Every day | ORAL | 2 refills | Status: DC

## 2018-10-17 NOTE — Telephone Encounter (Signed)
lpmtcb 3/9 

## 2018-10-17 NOTE — Telephone Encounter (Signed)
Valsartan 320mg  PO QD

## 2018-10-18 MED ORDER — VALSARTAN 320 MG PO TABS: 320.0000 mg | ORAL_TABLET | Freq: Every day | ORAL | 3 refills | Status: DC

## 2018-10-18 NOTE — Telephone Encounter (Signed)
D/c Losartan and start Va;sartan 320 mg per Dr Marlou Porch

## 2018-10-19 DIAGNOSIS — M9905 Segmental and somatic dysfunction of pelvic region: Secondary | ICD-10-CM | POA: Diagnosis not present

## 2018-10-19 DIAGNOSIS — M9903 Segmental and somatic dysfunction of lumbar region: Secondary | ICD-10-CM | POA: Diagnosis not present

## 2018-10-19 DIAGNOSIS — M5416 Radiculopathy, lumbar region: Secondary | ICD-10-CM | POA: Diagnosis not present

## 2018-10-19 DIAGNOSIS — M5136 Other intervertebral disc degeneration, lumbar region: Secondary | ICD-10-CM | POA: Diagnosis not present

## 2018-10-20 DIAGNOSIS — N99114 Postprocedural urethral stricture, male, unspecified: Secondary | ICD-10-CM | POA: Diagnosis not present

## 2018-10-20 DIAGNOSIS — N529 Male erectile dysfunction, unspecified: Secondary | ICD-10-CM | POA: Diagnosis not present

## 2018-10-20 DIAGNOSIS — N200 Calculus of kidney: Secondary | ICD-10-CM | POA: Diagnosis not present

## 2018-10-20 DIAGNOSIS — E291 Testicular hypofunction: Secondary | ICD-10-CM | POA: Diagnosis not present

## 2018-10-20 DIAGNOSIS — N489 Disorder of penis, unspecified: Secondary | ICD-10-CM | POA: Diagnosis not present

## 2018-10-20 DIAGNOSIS — C61 Malignant neoplasm of prostate: Secondary | ICD-10-CM | POA: Diagnosis not present

## 2018-10-20 DIAGNOSIS — Z8546 Personal history of malignant neoplasm of prostate: Secondary | ICD-10-CM | POA: Diagnosis not present

## 2018-10-20 DIAGNOSIS — R3912 Poor urinary stream: Secondary | ICD-10-CM | POA: Diagnosis not present

## 2018-11-25 DIAGNOSIS — R3912 Poor urinary stream: Secondary | ICD-10-CM | POA: Diagnosis not present

## 2018-11-25 DIAGNOSIS — N529 Male erectile dysfunction, unspecified: Secondary | ICD-10-CM | POA: Diagnosis not present

## 2018-11-25 DIAGNOSIS — E291 Testicular hypofunction: Secondary | ICD-10-CM | POA: Diagnosis not present

## 2018-11-25 DIAGNOSIS — Z8546 Personal history of malignant neoplasm of prostate: Secondary | ICD-10-CM | POA: Diagnosis not present

## 2018-11-25 DIAGNOSIS — N99114 Postprocedural urethral stricture, male, unspecified: Secondary | ICD-10-CM | POA: Diagnosis not present

## 2018-12-12 DIAGNOSIS — M9903 Segmental and somatic dysfunction of lumbar region: Secondary | ICD-10-CM | POA: Diagnosis not present

## 2018-12-12 DIAGNOSIS — M5136 Other intervertebral disc degeneration, lumbar region: Secondary | ICD-10-CM | POA: Diagnosis not present

## 2018-12-12 DIAGNOSIS — M5416 Radiculopathy, lumbar region: Secondary | ICD-10-CM | POA: Diagnosis not present

## 2018-12-12 DIAGNOSIS — M9905 Segmental and somatic dysfunction of pelvic region: Secondary | ICD-10-CM | POA: Diagnosis not present

## 2018-12-13 DIAGNOSIS — M9903 Segmental and somatic dysfunction of lumbar region: Secondary | ICD-10-CM | POA: Diagnosis not present

## 2018-12-13 DIAGNOSIS — M5416 Radiculopathy, lumbar region: Secondary | ICD-10-CM | POA: Diagnosis not present

## 2018-12-13 DIAGNOSIS — M9905 Segmental and somatic dysfunction of pelvic region: Secondary | ICD-10-CM | POA: Diagnosis not present

## 2018-12-13 DIAGNOSIS — M5136 Other intervertebral disc degeneration, lumbar region: Secondary | ICD-10-CM | POA: Diagnosis not present

## 2018-12-14 DIAGNOSIS — M9903 Segmental and somatic dysfunction of lumbar region: Secondary | ICD-10-CM | POA: Diagnosis not present

## 2018-12-14 DIAGNOSIS — M9905 Segmental and somatic dysfunction of pelvic region: Secondary | ICD-10-CM | POA: Diagnosis not present

## 2018-12-14 DIAGNOSIS — M5416 Radiculopathy, lumbar region: Secondary | ICD-10-CM | POA: Diagnosis not present

## 2018-12-14 DIAGNOSIS — M5136 Other intervertebral disc degeneration, lumbar region: Secondary | ICD-10-CM | POA: Diagnosis not present

## 2018-12-16 DIAGNOSIS — M5416 Radiculopathy, lumbar region: Secondary | ICD-10-CM | POA: Diagnosis not present

## 2018-12-16 DIAGNOSIS — M5136 Other intervertebral disc degeneration, lumbar region: Secondary | ICD-10-CM | POA: Diagnosis not present

## 2018-12-16 DIAGNOSIS — M9905 Segmental and somatic dysfunction of pelvic region: Secondary | ICD-10-CM | POA: Diagnosis not present

## 2018-12-16 DIAGNOSIS — M9903 Segmental and somatic dysfunction of lumbar region: Secondary | ICD-10-CM | POA: Diagnosis not present

## 2018-12-19 DIAGNOSIS — M9903 Segmental and somatic dysfunction of lumbar region: Secondary | ICD-10-CM | POA: Diagnosis not present

## 2018-12-19 DIAGNOSIS — M9905 Segmental and somatic dysfunction of pelvic region: Secondary | ICD-10-CM | POA: Diagnosis not present

## 2018-12-19 DIAGNOSIS — M5136 Other intervertebral disc degeneration, lumbar region: Secondary | ICD-10-CM | POA: Diagnosis not present

## 2018-12-19 DIAGNOSIS — M5416 Radiculopathy, lumbar region: Secondary | ICD-10-CM | POA: Diagnosis not present

## 2018-12-20 DIAGNOSIS — M9905 Segmental and somatic dysfunction of pelvic region: Secondary | ICD-10-CM | POA: Diagnosis not present

## 2018-12-20 DIAGNOSIS — M5136 Other intervertebral disc degeneration, lumbar region: Secondary | ICD-10-CM | POA: Diagnosis not present

## 2018-12-20 DIAGNOSIS — M9903 Segmental and somatic dysfunction of lumbar region: Secondary | ICD-10-CM | POA: Diagnosis not present

## 2018-12-20 DIAGNOSIS — M5416 Radiculopathy, lumbar region: Secondary | ICD-10-CM | POA: Diagnosis not present

## 2018-12-21 DIAGNOSIS — M9903 Segmental and somatic dysfunction of lumbar region: Secondary | ICD-10-CM | POA: Diagnosis not present

## 2018-12-21 DIAGNOSIS — M5416 Radiculopathy, lumbar region: Secondary | ICD-10-CM | POA: Diagnosis not present

## 2018-12-21 DIAGNOSIS — M9905 Segmental and somatic dysfunction of pelvic region: Secondary | ICD-10-CM | POA: Diagnosis not present

## 2018-12-21 DIAGNOSIS — M5136 Other intervertebral disc degeneration, lumbar region: Secondary | ICD-10-CM | POA: Diagnosis not present

## 2018-12-26 ENCOUNTER — Ambulatory Visit: Payer: Medicare Other | Admitting: Nurse Practitioner

## 2018-12-26 DIAGNOSIS — M9905 Segmental and somatic dysfunction of pelvic region: Secondary | ICD-10-CM | POA: Diagnosis not present

## 2018-12-26 DIAGNOSIS — M5416 Radiculopathy, lumbar region: Secondary | ICD-10-CM | POA: Diagnosis not present

## 2018-12-26 DIAGNOSIS — M5136 Other intervertebral disc degeneration, lumbar region: Secondary | ICD-10-CM | POA: Diagnosis not present

## 2018-12-26 DIAGNOSIS — M9903 Segmental and somatic dysfunction of lumbar region: Secondary | ICD-10-CM | POA: Diagnosis not present

## 2018-12-28 DIAGNOSIS — M5416 Radiculopathy, lumbar region: Secondary | ICD-10-CM | POA: Diagnosis not present

## 2018-12-28 DIAGNOSIS — M9903 Segmental and somatic dysfunction of lumbar region: Secondary | ICD-10-CM | POA: Diagnosis not present

## 2018-12-28 DIAGNOSIS — M9905 Segmental and somatic dysfunction of pelvic region: Secondary | ICD-10-CM | POA: Diagnosis not present

## 2018-12-28 DIAGNOSIS — M5136 Other intervertebral disc degeneration, lumbar region: Secondary | ICD-10-CM | POA: Diagnosis not present

## 2018-12-29 ENCOUNTER — Telehealth: Payer: Self-pay | Admitting: *Deleted

## 2018-12-29 NOTE — Telephone Encounter (Signed)
Virtual Visit Pre-Appointment Phone Call  "(Name), I am calling you today to discuss your upcoming appointment. We are currently trying to limit exposure to the virus that causes COVID-19 by seeing patients at home rather than in the office."  1. "What is the BEST phone number to call the day of the visit?" - include this in appointment notes  2. "Do you have or have access to (through a family member/friend) a smartphone with video capability that we can use for your visit?" a. If yes - list this number in appt notes as "cell" (if different from BEST phone #) and list the appointment type as a VIDEO visit in appointment notes b. If no - list the appointment type as a PHONE visit in appointment notes  3. Confirm consent - "In the setting of the current Covid19 crisis, you are scheduled for a (phone or video) visit with your provider on (Tuesday, May 26) at (3:00 pm).  Just as we do with many in-office visits, in order for you to participate in this visit, we must obtain consent.  If you'd like, I can send this to your mychart (if signed up) or email for you to review.  Otherwise, I can obtain your verbal consent now.  All virtual visits are billed to your insurance company just like a normal visit would be.  By agreeing to a virtual visit, we'd like you to understand that the technology does not allow for your provider to perform an examination, and thus may limit your provider's ability to fully assess your condition. If your provider identifies any concerns that need to be evaluated in person, we will make arrangements to do so.  Finally, though the technology is pretty good, we cannot assure that it will always work on either your or our end, and in the setting of a video visit, we may have to convert it to a phone-only visit.  In either situation, we cannot ensure that we have a secure connection.  Are you willing to proceed?" STAFF: Did the patient verbally acknowledge consent to telehealth  visit? Document YES/NO here: YES.   4. Advise patient to be prepared - "Two hours prior to your appointment, go ahead and check your blood pressure, pulse, oxygen saturation, and your weight (if you have the equipment to check those) and write them all down. When your visit starts, your provider will ask you for this information. If you have an Apple Watch or Kardia device, please plan to have heart rate information ready on the day of your appointment. Please have a pen and paper handy nearby the day of the visit as well."  5. Give patient instructions for MyChart download to smartphone OR Doximity/Doxy.me as below if video visit (depending on what platform provider is using)  6. Inform patient they will receive a phone call 15 minutes prior to their appointment time (may be from unknown caller ID) so they should be prepared to answer    TELEPHONE CALL NOTE  Antonio Clark has been deemed a candidate for a follow-up tele-health visit to limit community exposure during the Covid-19 pandemic. I spoke with the patient via phone to ensure availability of phone/video source, confirm preferred email & phone number, and discuss instructions and expectations.  I reminded Antonio Clark to be prepared with any vital sign and/or heart rhythm information that could potentially be obtained via home monitoring, at the time of his visit. I reminded Antonio Clark to expect a  phone call prior to his visit.  Danielle Avanell Shackleton 12/29/2018 9:53 AM   INSTRUCTIONS FOR DOWNLOADING THE MYCHART APP TO SMARTPHONE  - The patient must first make sure to have activated MyChart and know their login information - If Apple, go to [employer] and type in MyChart in the search bar and download the app. If Android, ask patient to go to [employer] and type in [place] in the search bar and download the app. The app is free but as with any other app downloads, their phone may require them to verify saved payment  information or Apple/Android password.  - The patient will need to then log into the app with their MyChart username and password, and select [facility] as their healthcare provider to link the account. When it is time for your visit, go to the MyChart app, find appointments, and click Begin Video Visit. Be sure to Select Allow for your device to access the Microphone and Camera for your visit. You will then be connected, and your provider will be with you shortly.  **If they have any issues connecting, or need assistance please contact MyChart service desk [phone])**  **If using a computer, in order to ensure the best quality for their visit they will need to use either of the following Internet Browsers: [employer], or Google Chrome**  IF USING DOXIMITY or DOXY.ME - The patient will receive a link just prior to their visit by text.     FULL LENGTH CONSENT FOR TELE-HEALTH VISIT   I hereby voluntarily request, consent and authorize [facility] and its employed or contracted physicians, physician assistants, nurse practitioners or other licensed health care professionals (the Practitioner), to provide me with telemedicine health care services (the "Services") as deemed necessary by the treating Practitioner. I acknowledge and consent to receive the Services by the Practitioner via telemedicine. I understand that the telemedicine visit will involve communicating with the Practitioner through live audiovisual communication technology and the disclosure of certain medical information by electronic transmission. I acknowledge that I have been given the opportunity to request an in-person assessment or other available alternative prior to the telemedicine visit and am voluntarily participating in the telemedicine visit.  I understand that I have the right to withhold or withdraw my consent to the use of telemedicine in the course of my care at any time, without affecting my right  to future care or treatment, and that the Practitioner or I may terminate the telemedicine visit at any time. I understand that I have the right to inspect all information obtained and/or recorded in the course of the telemedicine visit and may receive copies of available information for a reasonable fee.  I understand that some of the potential risks of receiving the Services via telemedicine include:  Marland Kitchen Delay or interruption in medical evaluation due to technological equipment failure or disruption; . Information transmitted may not be sufficient (e.g. poor resolution of images) to allow for appropriate medical decision making by the Practitioner; and/or  . In rare instances, security protocols could fail, causing a breach of personal health information.  Furthermore, I acknowledge that it is my responsibility to provide information about my medical history, conditions and care that is complete and accurate to the best of my ability. I acknowledge that Practitioner's advice, recommendations, and/or decision may be based on factors not within their control, such as incomplete or inaccurate data provided by me or distortions of diagnostic images or specimens that may result  from electronic transmissions. I understand that the practice of medicine is not an exact science and that Practitioner makes no warranties or guarantees regarding treatment outcomes. I acknowledge that I will receive a copy of this consent concurrently upon execution via email to the email address I last provided but may also request a printed copy by calling the office of [facility].    I understand that my insurance will be billed for this visit.   I have read or had this consent read to me. . I understand the contents of this consent, which adequately explains the benefits and risks of the Services being provided via telemedicine.  . I have been provided ample opportunity to ask questions regarding this consent and the Services  and have had my questions answered to my satisfaction. . I give my informed consent for the services to be provided through the use of telemedicine in my medical care  By participating in this telemedicine visit I agree to the above.

## 2019-01-02 NOTE — Progress Notes (Signed)
Telehealth Visit     Virtual Visit via Video Note   This visit type was conducted due to national recommendations for restrictions regarding the COVID-19 Pandemic (e.g. social distancing) in an effort to limit this patient's exposure and mitigate transmission in our community.  Due to his co-morbid illnesses, this patient is at least at moderate risk for complications without adequate follow up.  This format is felt to be most appropriate for this patient at this time.  All issues noted in this document were discussed and addressed.  A limited physical exam was performed with this format.  Please refer to the patient's chart for his consent to telehealth for [facility].   Evaluation Performed:  Follow-up visit  This visit type was conducted due to national recommendations for restrictions regarding the COVID-19 Pandemic (e.g. social distancing).  This format is felt to be most appropriate for this patient at this time.  All issues noted in this document were discussed and addressed.  No physical exam was performed (except for noted visual exam findings with Video Visits).  Please refer to the patient's chart (MyChart message for video visits and phone note for telephone visits) for the patient's consent to telehealth for [facility].  Date:  01/03/2019   ID:  Antonio Clark, DOB Mar 27, 1936, MRN [id number]  Patient Location:  Car  Provider location:   Home  PCP:  Susy Frizzle, MD  Cardiologist:  Marisa Cyphers Electrophysiologist:  None   Chief Complaint:  Follow up  History of Present Illness:    Antonio Clark is a 83 y.o. male who presents via audio/video conferencing for a telehealth visit today.  Seen for Dr. Marlou Porch. Formerly seen by Dr. Aundra Dubin as well as Dr. Susa Simmonds.   I previously took care of both his brother and his sister in law Data processing manager and Mostafa Yuan).   He has a history of HTN and HLD. Strong FH for CAD. Remote Cardiolite in 2013 was normal.   Seen back in  May of 2019 by Dr. Marlou Porch - had had AF while in the Urology office. Found to have OSA - but CPAP made him claustrophobic. Placed on Eliquis - changed to Pradaxa due to insurance coverage.   I saw him in November - he was doing well. Was able to tolerate CPAP after some adjustments. Walking 3 miles a day. Rhythm was good. BP typically lower at home.   The patient does not have symptoms concerning for COVID-19 infection (fever, chills, cough, or new shortness of breath).   Seen today via Doximity video. He has consented for this visit. No real concerns. Doing well. He is actually waiting to go into the dentist office. He has been more limited by his back - had an injury earlier this year. [place] [facility] [place] [facility] And he had stumped his toe - but that is getting better. No chest pain. Not short of breath. No problem with the Pradaxa - no bleeding or excessive bruising. No palpitations. He has been successful with weight loss.   Past Medical History:  Diagnosis Date  . Atrial fibrillation ([place])   . Borderline diabetic   . History of echocardiogram 07/ 07/ 2011  . History of lithotripsy 1989  . Hyperlipidemia   . Hypertension   . Prostate CA ([place]) 2010   Past Surgical History:  Procedure Laterality Date  . COLONOSCOPY  2002  . INSERTION PROSTATE RADIATION SEED  8 4 2010   per  Dr Rosana Hoes  . POLYPECTOMY  2002  . THROAT SURGERY  1980   benign cyst  . URETHRAL STRICTURE DILATATION     also penile implant     Current Meds  Medication Sig  . clobetasol ointment (TEMOVATE) 7.74 % Apply 1 application topically as needed (skin irritation).   . dabigatran (PRADAXA) 150 MG CAPS capsule Take 1 capsule (150 mg total) by mouth 2 (two) times daily.  . hydrochlorothiazide (MICROZIDE) 12.5 MG capsule Take 12.5 mg by mouth daily.  . multivitamin (THERAGRAN) per tablet Take 1 tablet by mouth daily.    . Omega-3 Fatty Acids (FISH OIL) 1200 MG CAPS Take 1,200 mg by mouth daily.    Marland Kitchen  omeprazole (PRILOSEC) 20 MG capsule Take 20 mg by mouth as needed (heartburn).  . pravastatin (PRAVACHOL) 40 MG tablet TAKE 1 TABLET BY MOUTH EVERY DAY IN THE EVENING  . Tamsulosin HCl (FLOMAX) 0.4 MG CAPS Take 0.4 mg by mouth daily.    . valsartan (DIOVAN) 320 MG tablet Take 1 tablet (320 mg total) by mouth daily.  . [DISCONTINUED] omeprazole (PRILOSEC) 20 MG capsule Take 1 capsule (20 mg total) by mouth daily. (Patient taking differently: Take 20 mg by mouth as needed (heart burn). )     Allergies:   Patient has no known allergies.   Social History   Tobacco Use  . Smoking status: Never Smoker  . Smokeless tobacco: Never Used  Substance Use Topics  . Alcohol use: No  . Drug use: No     Family Hx: The patient's family history includes Arrhythmia in his brother; Cancer in his mother; Colon cancer (age of onset: 29) in his mother; Coronary artery disease in his brother; Diabetes in his brother and brother; Hearing loss in his brother; Heart disease in his brother and father; Heart failure in his brother; Hyperlipidemia in his sister; Hypertension in his brother, brother, brother, and father; Stroke in his mother and sister; Sudden death in his father; Suicidality in his sister. There is no history of Esophageal cancer, Pancreatic cancer, Prostate cancer, Rectal cancer, or Stomach cancer.  ROS:   Please see the history of present illness.   All other systems reviewed are negative.    Objective:    Vital Signs:  BP 135/64   Pulse 85   Ht 5\' 9"  (1.753 m)   Wt 193 lb (87.5 kg)   BMI 28.50 kg/m    Wt Readings from Last 3 Encounters:  01/03/19 193 lb (87.5 kg)  07/04/18 202 lb (91.6 kg)  03/14/18 202 lb (91.6 kg)    Alert male in no acute distress. Not short of breath with activity. Appropriate with his responses.    Labs/Other Tests and Data Reviewed:    Lab Results  Component Value Date   WBC 4.2 08/01/2018   HGB 12.4 (L) 08/01/2018   HCT 37.5 (L) 08/01/2018   PLT 135  (L) 08/01/2018   GLUCOSE 140 (H) 08/01/2018   CHOL 123 08/01/2018   TRIG 110 08/01/2018   HDL 43 08/01/2018   LDLCALC 60 08/01/2018   ALT 16 08/01/2018   AST 18 08/01/2018   NA 141 08/01/2018   K 5.0 08/01/2018   CL 107 08/01/2018   CREATININE 1.00 08/01/2018   BUN 14 08/01/2018   CO2 26 08/01/2018   INR 1.0 03/06/2009   HGBA1C 6.0 (H) 08/01/2018     BNP (last 3 results) No results for input(s): BNP in the last 8760 hours.  ProBNP (last 3  results) No results for input(s): PROBNP in the last 8760 hours.    Prior CV studies:    The following studies were reviewed today:  11/05/2017- Echocardiogram - Left ventricle: The cavity size was normal. There was mild focal basal hypertrophy of the septum. Systolic function was normal. The estimated ejection fraction was in the range of 50% to 55%. Wall motion was normal; there were no regional wall motion abnormalities. Features are consistent with a pseudonormal left ventricular filling pattern, with concomitant abnormal relaxation and increased filling pressure (grade 2 diastolic dysfunction). - Aortic valve: Trileaflet; moderately thickened, mildly calcified leaflets. There was mild regurgitation. Regurgitation pressure half-time: 701 ms. - Aorta: Aortic root dimension: 38 mm (ED). - Aortic root: The aortic root was mildly dilated. - Mitral valve: Calcified annulus. There was trivial regurgitation. - Left atrium: The atrium was moderately dilated. - Right ventricle: The cavity size was mildly dilated. Wall thickness was normal. - Atrial septum: There was increased thickness of the septum, consistent with lipomatous hypertrophy. - Tricuspid valve: There was trivial regurgitation. - Pulmonic valve: There was trivial regurgitation.    ASSESSMENT & PLAN:    1. PAF - remains in NSR - remains on anticoagulation. Surveillance labs from December 2019 noted.   2. FH + CAD - CV risk factor modification  advised. He has no active symptoms.   3. OSA - on CPAP - tolerating better after adjustments.   4. Chronic anticoagulation - no problems noted.   5. HTN - BP looks good today - no changes made today. He will continue to monitor.   6. HLD - on statin therapy. LDL is 60 per lab from 07/2018  7. Borderline DM - not discussed  8. COVID-19 Education: The signs and symptoms of COVID-19 were discussed with the patient and how to seek care for testing (follow up with PCP or arrange E-visit).  The importance of social distancing, staying at home, hand hygiene and wearing a mask when out in public were discussed today.  Patient Risk:   After full review of this patient's clinical status, I feel that they are at least moderate risk at this time.  Time:   Today, I have spent 8 minutes with the patient with telehealth technology discussing the above issues.     Medication Adjustments/Labs and Tests Ordered: Current medicines are reviewed at length with the patient today.  Concerns regarding medicines are outlined above.   Tests Ordered: No orders of the defined types were placed in this encounter.   Medication Changes: No orders of the defined types were placed in this encounter.   Disposition:  FU with me in about 6 months.    Patient is agreeable to this plan and will call if any problems develop in the interim.   Amie Critchley, NP  01/03/2019 2:58 PM    [facility]

## 2019-01-03 ENCOUNTER — Telehealth (INDEPENDENT_AMBULATORY_CARE_PROVIDER_SITE_OTHER): Payer: Medicare Other | Admitting: Nurse Practitioner

## 2019-01-03 ENCOUNTER — Other Ambulatory Visit: Payer: Self-pay

## 2019-01-03 ENCOUNTER — Encounter: Payer: Self-pay | Admitting: Nurse Practitioner

## 2019-01-03 VITALS — BP 135/64 | HR 85 | Ht 69.0 in | Wt 193.0 lb

## 2019-01-03 DIAGNOSIS — I10 Essential (primary) hypertension: Secondary | ICD-10-CM

## 2019-01-03 DIAGNOSIS — E7800 Pure hypercholesterolemia, unspecified: Secondary | ICD-10-CM

## 2019-01-03 DIAGNOSIS — Z7189 Other specified counseling: Secondary | ICD-10-CM

## 2019-01-03 DIAGNOSIS — I480 Paroxysmal atrial fibrillation: Secondary | ICD-10-CM

## 2019-01-03 NOTE — Patient Instructions (Addendum)
After Visit Summary:  We will be checking the following labs today - NONE   Medication Instructions:    Continue with your current medicines.    If you need a refill on your cardiac medications before your next appointment, please call your pharmacy.     Testing/Procedures To Be Arranged:  N/A  Follow-Up:   See me in about 6 months.    At [facility], you and your health needs are our priority.  As part of our continuing mission to provide you with exceptional heart care, we have created designated Provider Care Teams.  These Care Teams include your primary Cardiologist (physician) and Advanced Practice Providers (APPs -  Physician Assistants and Nurse Practitioners) who all work together to provide you with the care you need, when you need it.  Special Instructions:  . Stay safe, stay home, wash your hands for at least 20 seconds and wear a mask when out in public.  . It was good to talk with you today.    Call the [facility] office at [phone] if you have any questions, problems or concerns.       

## 2019-01-04 DIAGNOSIS — M9905 Segmental and somatic dysfunction of pelvic region: Secondary | ICD-10-CM | POA: Diagnosis not present

## 2019-01-04 DIAGNOSIS — M9903 Segmental and somatic dysfunction of lumbar region: Secondary | ICD-10-CM | POA: Diagnosis not present

## 2019-01-04 DIAGNOSIS — M5416 Radiculopathy, lumbar region: Secondary | ICD-10-CM | POA: Diagnosis not present

## 2019-01-04 DIAGNOSIS — M5136 Other intervertebral disc degeneration, lumbar region: Secondary | ICD-10-CM | POA: Diagnosis not present

## 2019-01-05 DIAGNOSIS — N99114 Postprocedural urethral stricture, male, unspecified: Secondary | ICD-10-CM | POA: Diagnosis not present

## 2019-01-05 DIAGNOSIS — C61 Malignant neoplasm of prostate: Secondary | ICD-10-CM | POA: Diagnosis not present

## 2019-01-05 DIAGNOSIS — E291 Testicular hypofunction: Secondary | ICD-10-CM | POA: Diagnosis not present

## 2019-01-05 DIAGNOSIS — N528 Other male erectile dysfunction: Secondary | ICD-10-CM | POA: Diagnosis not present

## 2019-01-09 DIAGNOSIS — M5136 Other intervertebral disc degeneration, lumbar region: Secondary | ICD-10-CM | POA: Diagnosis not present

## 2019-01-09 DIAGNOSIS — M5416 Radiculopathy, lumbar region: Secondary | ICD-10-CM | POA: Diagnosis not present

## 2019-01-09 DIAGNOSIS — M9903 Segmental and somatic dysfunction of lumbar region: Secondary | ICD-10-CM | POA: Diagnosis not present

## 2019-01-09 DIAGNOSIS — M9905 Segmental and somatic dysfunction of pelvic region: Secondary | ICD-10-CM | POA: Diagnosis not present

## 2019-01-10 DIAGNOSIS — M9905 Segmental and somatic dysfunction of pelvic region: Secondary | ICD-10-CM | POA: Diagnosis not present

## 2019-01-10 DIAGNOSIS — M9903 Segmental and somatic dysfunction of lumbar region: Secondary | ICD-10-CM | POA: Diagnosis not present

## 2019-01-10 DIAGNOSIS — M5416 Radiculopathy, lumbar region: Secondary | ICD-10-CM | POA: Diagnosis not present

## 2019-01-10 DIAGNOSIS — M5136 Other intervertebral disc degeneration, lumbar region: Secondary | ICD-10-CM | POA: Diagnosis not present

## 2019-01-11 DIAGNOSIS — H35071 Retinal telangiectasis, right eye: Secondary | ICD-10-CM | POA: Diagnosis not present

## 2019-01-11 DIAGNOSIS — H35372 Puckering of macula, left eye: Secondary | ICD-10-CM | POA: Diagnosis not present

## 2019-01-11 DIAGNOSIS — H5213 Myopia, bilateral: Secondary | ICD-10-CM | POA: Diagnosis not present

## 2019-01-11 DIAGNOSIS — Z961 Presence of intraocular lens: Secondary | ICD-10-CM | POA: Diagnosis not present

## 2019-01-16 DIAGNOSIS — M5416 Radiculopathy, lumbar region: Secondary | ICD-10-CM | POA: Diagnosis not present

## 2019-01-16 DIAGNOSIS — M5136 Other intervertebral disc degeneration, lumbar region: Secondary | ICD-10-CM | POA: Diagnosis not present

## 2019-01-16 DIAGNOSIS — M9905 Segmental and somatic dysfunction of pelvic region: Secondary | ICD-10-CM | POA: Diagnosis not present

## 2019-01-16 DIAGNOSIS — M9903 Segmental and somatic dysfunction of lumbar region: Secondary | ICD-10-CM | POA: Diagnosis not present

## 2019-01-18 DIAGNOSIS — M5136 Other intervertebral disc degeneration, lumbar region: Secondary | ICD-10-CM | POA: Diagnosis not present

## 2019-01-18 DIAGNOSIS — M9903 Segmental and somatic dysfunction of lumbar region: Secondary | ICD-10-CM | POA: Diagnosis not present

## 2019-01-18 DIAGNOSIS — M5416 Radiculopathy, lumbar region: Secondary | ICD-10-CM | POA: Diagnosis not present

## 2019-01-18 DIAGNOSIS — M9905 Segmental and somatic dysfunction of pelvic region: Secondary | ICD-10-CM | POA: Diagnosis not present

## 2019-01-23 DIAGNOSIS — M9903 Segmental and somatic dysfunction of lumbar region: Secondary | ICD-10-CM | POA: Diagnosis not present

## 2019-01-23 DIAGNOSIS — M5136 Other intervertebral disc degeneration, lumbar region: Secondary | ICD-10-CM | POA: Diagnosis not present

## 2019-01-23 DIAGNOSIS — M9905 Segmental and somatic dysfunction of pelvic region: Secondary | ICD-10-CM | POA: Diagnosis not present

## 2019-01-23 DIAGNOSIS — M5416 Radiculopathy, lumbar region: Secondary | ICD-10-CM | POA: Diagnosis not present

## 2019-01-25 DIAGNOSIS — M9905 Segmental and somatic dysfunction of pelvic region: Secondary | ICD-10-CM | POA: Diagnosis not present

## 2019-01-25 DIAGNOSIS — M5416 Radiculopathy, lumbar region: Secondary | ICD-10-CM | POA: Diagnosis not present

## 2019-01-25 DIAGNOSIS — M5136 Other intervertebral disc degeneration, lumbar region: Secondary | ICD-10-CM | POA: Diagnosis not present

## 2019-01-25 DIAGNOSIS — M9903 Segmental and somatic dysfunction of lumbar region: Secondary | ICD-10-CM | POA: Diagnosis not present

## 2019-01-30 DIAGNOSIS — M9905 Segmental and somatic dysfunction of pelvic region: Secondary | ICD-10-CM | POA: Diagnosis not present

## 2019-01-30 DIAGNOSIS — M9903 Segmental and somatic dysfunction of lumbar region: Secondary | ICD-10-CM | POA: Diagnosis not present

## 2019-01-30 DIAGNOSIS — M5136 Other intervertebral disc degeneration, lumbar region: Secondary | ICD-10-CM | POA: Diagnosis not present

## 2019-01-30 DIAGNOSIS — M5416 Radiculopathy, lumbar region: Secondary | ICD-10-CM | POA: Diagnosis not present

## 2019-02-01 ENCOUNTER — Other Ambulatory Visit: Payer: Self-pay | Admitting: Cardiology

## 2019-02-01 DIAGNOSIS — M9903 Segmental and somatic dysfunction of lumbar region: Secondary | ICD-10-CM | POA: Diagnosis not present

## 2019-02-01 DIAGNOSIS — M9905 Segmental and somatic dysfunction of pelvic region: Secondary | ICD-10-CM | POA: Diagnosis not present

## 2019-02-01 DIAGNOSIS — M5416 Radiculopathy, lumbar region: Secondary | ICD-10-CM | POA: Diagnosis not present

## 2019-02-01 DIAGNOSIS — M5136 Other intervertebral disc degeneration, lumbar region: Secondary | ICD-10-CM | POA: Diagnosis not present

## 2019-02-01 NOTE — Telephone Encounter (Signed)
Age 83, weight 87.5kg, SCr 1 on 08/01/18, CrCl 80mL/min, last visit in May 2020, afib indication

## 2019-02-06 DIAGNOSIS — M9905 Segmental and somatic dysfunction of pelvic region: Secondary | ICD-10-CM | POA: Diagnosis not present

## 2019-02-06 DIAGNOSIS — M9903 Segmental and somatic dysfunction of lumbar region: Secondary | ICD-10-CM | POA: Diagnosis not present

## 2019-02-06 DIAGNOSIS — M5136 Other intervertebral disc degeneration, lumbar region: Secondary | ICD-10-CM | POA: Diagnosis not present

## 2019-02-06 DIAGNOSIS — M5416 Radiculopathy, lumbar region: Secondary | ICD-10-CM | POA: Diagnosis not present

## 2019-02-08 DIAGNOSIS — M5136 Other intervertebral disc degeneration, lumbar region: Secondary | ICD-10-CM | POA: Diagnosis not present

## 2019-02-08 DIAGNOSIS — M5416 Radiculopathy, lumbar region: Secondary | ICD-10-CM | POA: Diagnosis not present

## 2019-02-08 DIAGNOSIS — M9905 Segmental and somatic dysfunction of pelvic region: Secondary | ICD-10-CM | POA: Diagnosis not present

## 2019-02-08 DIAGNOSIS — M9903 Segmental and somatic dysfunction of lumbar region: Secondary | ICD-10-CM | POA: Diagnosis not present

## 2019-02-09 ENCOUNTER — Other Ambulatory Visit: Payer: Medicare Other

## 2019-02-09 ENCOUNTER — Other Ambulatory Visit: Payer: Self-pay

## 2019-02-09 DIAGNOSIS — I10 Essential (primary) hypertension: Secondary | ICD-10-CM | POA: Diagnosis not present

## 2019-02-09 DIAGNOSIS — R7303 Prediabetes: Secondary | ICD-10-CM | POA: Diagnosis not present

## 2019-02-09 DIAGNOSIS — E7800 Pure hypercholesterolemia, unspecified: Secondary | ICD-10-CM | POA: Diagnosis not present

## 2019-02-10 LAB — COMPREHENSIVE METABOLIC PANEL
AG Ratio: 1.9 (calc) (ref 1.0–2.5)
ALT: 12 U/L (ref 9–46)
AST: 14 U/L (ref 10–35)
Albumin: 4.2 g/dL (ref 3.6–5.1)
Alkaline phosphatase (APISO): 49 U/L (ref 35–144)
BUN: 22 mg/dL (ref 7–25)
CO2: 24 mmol/L (ref 20–32)
Calcium: 9.4 mg/dL (ref 8.6–10.3)
Chloride: 107 mmol/L (ref 98–110)
Creat: 1 mg/dL (ref 0.70–1.11)
Globulin: 2.2 g/dL (calc) (ref 1.9–3.7)
Glucose, Bld: 129 mg/dL — ABNORMAL HIGH (ref 65–99)
Potassium: 4.3 mmol/L (ref 3.5–5.3)
Sodium: 140 mmol/L (ref 135–146)
Total Bilirubin: 0.6 mg/dL (ref 0.2–1.2)
Total Protein: 6.4 g/dL (ref 6.1–8.1)

## 2019-02-10 LAB — CBC WITH DIFFERENTIAL/PLATELET
Absolute Monocytes: 440 cells/uL (ref 200–950)
Basophils Absolute: 30 cells/uL (ref 0–200)
Basophils Relative: 0.6 %
Eosinophils Absolute: 350 cells/uL (ref 15–500)
Eosinophils Relative: 7 %
HCT: 38.8 % (ref 38.5–50.0)
Hemoglobin: 13.1 g/dL — ABNORMAL LOW (ref 13.2–17.1)
Lymphs Abs: 1485 cells/uL (ref 850–3900)
MCH: 30.3 pg (ref 27.0–33.0)
MCHC: 33.8 g/dL (ref 32.0–36.0)
MCV: 89.8 fL (ref 80.0–100.0)
MPV: 9.9 fL (ref 7.5–12.5)
Monocytes Relative: 8.8 %
Neutro Abs: 2695 cells/uL (ref 1500–7800)
Neutrophils Relative %: 53.9 %
Platelets: 132 10*3/uL — ABNORMAL LOW (ref 140–400)
RBC: 4.32 10*6/uL (ref 4.20–5.80)
RDW: 13.4 % (ref 11.0–15.0)
Total Lymphocyte: 29.7 %
WBC: 5 10*3/uL (ref 3.8–10.8)

## 2019-02-10 LAB — HEMOGLOBIN A1C
Hgb A1c MFr Bld: 6.6 % of total Hgb — ABNORMAL HIGH (ref <5.7)
Mean Plasma Glucose: 143 (calc)
eAG (mmol/L): 7.9 (calc)

## 2019-02-10 LAB — LIPID PANEL
Cholesterol: 141 mg/dL (ref <200)
HDL: 42 mg/dL (ref >=40)
LDL Cholesterol (Calc): 73 mg/dL (calc)
Non-HDL Cholesterol (Calc): 99 mg/dL (calc) (ref <130)
Total CHOL/HDL Ratio: 3.4 (calc) (ref <5.0)
Triglycerides: 179 mg/dL — ABNORMAL HIGH (ref <150)

## 2019-02-13 DIAGNOSIS — M5136 Other intervertebral disc degeneration, lumbar region: Secondary | ICD-10-CM | POA: Diagnosis not present

## 2019-02-13 DIAGNOSIS — M5416 Radiculopathy, lumbar region: Secondary | ICD-10-CM | POA: Diagnosis not present

## 2019-02-13 DIAGNOSIS — M9903 Segmental and somatic dysfunction of lumbar region: Secondary | ICD-10-CM | POA: Diagnosis not present

## 2019-02-13 DIAGNOSIS — M9905 Segmental and somatic dysfunction of pelvic region: Secondary | ICD-10-CM | POA: Diagnosis not present

## 2019-02-14 ENCOUNTER — Encounter: Payer: Self-pay | Admitting: Family Medicine

## 2019-02-14 ENCOUNTER — Other Ambulatory Visit: Payer: Self-pay

## 2019-02-14 ENCOUNTER — Ambulatory Visit (INDEPENDENT_AMBULATORY_CARE_PROVIDER_SITE_OTHER): Payer: Medicare Other | Admitting: Family Medicine

## 2019-02-14 VITALS — BP 154/82 | HR 70 | Temp 98.9°F | Resp 18 | Ht 69.0 in | Wt 200.0 lb

## 2019-02-14 DIAGNOSIS — Z0001 Encounter for general adult medical examination with abnormal findings: Secondary | ICD-10-CM | POA: Diagnosis not present

## 2019-02-14 DIAGNOSIS — I4891 Unspecified atrial fibrillation: Secondary | ICD-10-CM | POA: Diagnosis not present

## 2019-02-14 DIAGNOSIS — E7800 Pure hypercholesterolemia, unspecified: Secondary | ICD-10-CM

## 2019-02-14 DIAGNOSIS — M720 Palmar fascial fibromatosis [Dupuytren]: Secondary | ICD-10-CM | POA: Diagnosis not present

## 2019-02-14 DIAGNOSIS — R7303 Prediabetes: Secondary | ICD-10-CM | POA: Diagnosis not present

## 2019-02-14 DIAGNOSIS — I10 Essential (primary) hypertension: Secondary | ICD-10-CM | POA: Diagnosis not present

## 2019-02-14 DIAGNOSIS — Z0000 Encounter for general adult medical examination without abnormal findings: Secondary | ICD-10-CM

## 2019-02-14 MED ORDER — PREDNISONE 20 MG PO TABS: ORAL_TABLET | ORAL | 0 refills | Status: DC

## 2019-02-14 NOTE — Progress Notes (Signed)
Subjective:    Patient ID: Antonio Clark, male    DOB: 05/22/1936, 83 y.o.   MRN: [id number]  HPI  Here for CPE.  He has no medical concerns today. Past medical history is significant for prostate cancer. Dr. Rosana Hoes implanted radioactive seeds. He is currently followed by urology.  Due to age, the patient does not require colonoscopy.  He does complain of pain in both hands left greater than right.  He has a significant flexor contracture in the fourth digit bilaterally consistent with diffusions contracture.  He would like to see a hand surgeon to discuss treatment options.  He also complains of a one-month history of low back pain located around the level of L5 radiating into his right upper thigh.  It occurred while he was on a mission trip doing physical labor.  Is made worse by standing and walking.  He denies any leg weakness.  He denies any saddle anesthesia.  He denies any fevers or chills.  He has seen a chiropractor who did x-rays who said that he had degenerative disc disease at L5-S1 per his report.  There was no fracture seen.  There was no evidence of bone cancer on the x-ray per the patient's report.  He also has a red macule below his right breast.  There is thick Gastineau scale.  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  I have recommended that he follow-up with his dermatologist for an excisional biopsy.  Recent lab work is listed below.  Immunizations are listed below. Immunization History  Administered Date(s) Administered  . Influenza, High Dose Seasonal PF 06/07/2017, 05/07/2018  . Influenza,inj,Quad PF,6+ Mos 05/12/2016  . Influenza-Unspecified 05/30/2015  . Pneumococcal Conjugate-13 07/08/2015  . Pneumococcal Polysaccharide-23 01/20/2016  . Tdap 06/11/2011  . Zoster Recombinat (Shingrix) 07/09/2017, 09/30/2017    Lab on 02/09/2019  Component Date Value Ref Range Status  . WBC 02/09/2019 5.0  3.8 - 10.8 Thousand/uL Final  . RBC 02/09/2019 4.32   4.20 - 5.80 Million/uL Final  . Hemoglobin 02/09/2019 13.1* 13.2 - 17.1 g/dL Final  . HCT 02/09/2019 38.8  38.5 - 50.0 % Final  . MCV 02/09/2019 89.8  80.0 - 100.0 fL Final  . MCH 02/09/2019 30.3  27.0 - 33.0 pg Final  . MCHC 02/09/2019 33.8  32.0 - 36.0 g/dL Final  . RDW 02/09/2019 13.4  11.0 - 15.0 % Final  . Platelets 02/09/2019 132* 140 - 400 Thousand/uL Final  . MPV 02/09/2019 9.9  7.5 - 12.5 fL Final  . Neutro Abs 02/09/2019 2,695  1,500 - 7,800 cells/uL Final  . Lymphs Abs 02/09/2019 1,485  850 - 3,900 cells/uL Final  . Absolute Monocytes 02/09/2019 440  200 - 950 cells/uL Final  . Eosinophils Absolute 02/09/2019 350  15 - 500 cells/uL Final  . Basophils Absolute 02/09/2019 30  0 - 200 cells/uL Final  . Neutrophils Relative % 02/09/2019 53.9  % Final  . Total Lymphocyte 02/09/2019 29.7  % Final  . Monocytes Relative 02/09/2019 8.8  % Final  . Eosinophils Relative 02/09/2019 7.0  % Final  . Basophils Relative 02/09/2019 0.6  % Final  . Glucose, Bld 02/09/2019 129* 65 - 99 mg/dL Final   Comment: .            Fasting reference interval . For someone without known diabetes, a glucose value >125 mg/dL indicates that they may have diabetes and this should be confirmed with a follow-up test. .   Marland Kitchen  BUN 02/09/2019 22  7 - 25 mg/dL Final  . Creat 02/09/2019 1.00  0.70 - 1.11 mg/dL Final   Comment: For patients >78 years of age, the reference limit for Creatinine is approximately 13% higher for people identified as African-American. .   Havery Moros Ratio 45/40/9811 NOT APPLICABLE  6 - 22 (calc) Final  . Sodium 02/09/2019 140  135 - 146 mmol/L Final  . Potassium 02/09/2019 4.3  3.5 - 5.3 mmol/L Final  . Chloride 02/09/2019 107  98 - 110 mmol/L Final  . CO2 02/09/2019 24  20 - 32 mmol/L Final  . Calcium 02/09/2019 9.4  8.6 - 10.3 mg/dL Final  . Total Protein 02/09/2019 6.4  6.1 - 8.1 g/dL Final  . Albumin 02/09/2019 4.2  3.6 - 5.1 g/dL Final  . Globulin 02/09/2019 2.2  1.9  - 3.7 g/dL (calc) Final  . AG Ratio 02/09/2019 1.9  1.0 - 2.5 (calc) Final  . Total Bilirubin 02/09/2019 0.6  0.2 - 1.2 mg/dL Final  . Alkaline phosphatase (APISO) 02/09/2019 49  35 - 144 U/L Final  . AST 02/09/2019 14  10 - 35 U/L Final  . ALT 02/09/2019 12  9 - 46 U/L Final  . Cholesterol 02/09/2019 141  <200 mg/dL Final  . HDL 02/09/2019 42  > OR = 40 mg/dL Final  . Triglycerides 02/09/2019 179* <150 mg/dL Final  . LDL Cholesterol (Calc) 02/09/2019 73  mg/dL (calc) Final   Comment: Reference range: <100 . Desirable range <100 mg/dL for primary prevention;   <70 mg/dL for patients with CHD or diabetic patients  with > or = 2 CHD risk factors. Marland Kitchen LDL-C is now calculated using the Martin-Hopkins  calculation, which is a validated novel method providing  better accuracy than the Friedewald equation in the  estimation of LDL-C.  Cresenciano Genre et al. Annamaria Helling. 9147;829(56): 2061-2068  (http://education.[employer].com/faq/FAQ164)   . Total CHOL/HDL Ratio 02/09/2019 3.4  <5.0 (calc) Final  . Non-HDL Cholesterol (Calc) 02/09/2019 99  <130 mg/dL (calc) Final   Comment: For patients with diabetes plus 1 major ASCVD risk  factor, treating to a non-HDL-C goal of <100 mg/dL  (LDL-C of <70 mg/dL) is considered a therapeutic  option.   . Hgb A1c MFr Bld 02/09/2019 6.6* <5.7 % of total Hgb Final   Comment: For someone without known diabetes, a hemoglobin A1c value of 6.5% or greater indicates that they may have  diabetes and this should be confirmed with a follow-up  test. . For someone with known diabetes, a value <7% indicates  that their diabetes is well controlled and a value  greater than or equal to 7% indicates suboptimal  control. A1c targets should be individualized based on  duration of diabetes, age, comorbid conditions, and  other considerations. . Currently, no consensus exists regarding use of hemoglobin A1c for diagnosis of diabetes for children. .   . Mean Plasma Glucose  02/09/2019 143  (calc) Final  . eAG (mmol/L) 02/09/2019 7.9  (calc) Final    Past Medical History:  Diagnosis Date  . Atrial fibrillation ([place])   . Borderline diabetic   . History of echocardiogram 07/ 07/ 2011  . History of lithotripsy 1989  . Hyperlipidemia   . Hypertension   . Prostate CA ([place]) 2010   Past Surgical History:  Procedure Laterality Date  . COLONOSCOPY  2002  . INSERTION PROSTATE RADIATION SEED  8 4 2010   per Dr Rosana Hoes  . POLYPECTOMY  2002  . THROAT SURGERY  1980   benign cyst  . URETHRAL STRICTURE DILATATION     also penile implant   Current Outpatient Medications on File Prior to Visit  Medication Sig Dispense Refill  . clobetasol ointment (TEMOVATE) 3.22 % Apply 1 application topically as needed (skin irritation).     . hydrochlorothiazide (MICROZIDE) 12.5 MG capsule Take 12.5 mg by mouth daily.    . multivitamin (THERAGRAN) per tablet Take 1 tablet by mouth daily.      . Omega-3 Fatty Acids (FISH OIL) 1200 MG CAPS Take 1,200 mg by mouth daily.      Marland Kitchen omeprazole (PRILOSEC) 20 MG capsule Take 20 mg by mouth as needed (heartburn).    Marland Kitchen PRADAXA 150 MG CAPS capsule TAKE 1 CAPSULE BY MOUTH TWICE A DAY 180 capsule 1  . pravastatin (PRAVACHOL) 40 MG tablet TAKE 1 TABLET BY MOUTH EVERY DAY IN THE EVENING 90 tablet 2  . Tamsulosin HCl (FLOMAX) 0.4 MG CAPS Take 0.4 mg by mouth daily.      . valsartan (DIOVAN) 320 MG tablet Take 1 tablet (320 mg total) by mouth daily. 90 tablet 3   No current facility-administered medications on file prior to visit.    No Known Allergies Social History   Socioeconomic History  . Marital status: Single    Spouse name: Not on file  . Number of children: Not on file  . Years of education: Not on file  . Highest education level: Not on file  Occupational History  . Occupation: retired  Scientific laboratory technician  . Financial resource strain: Not on file  . Food insecurity    Worry: Not on file    Inability: Not on file  . Transportation  needs    Medical: Not on file    Non-medical: Not on file  Tobacco Use  . Smoking status: Never Smoker  . Smokeless tobacco: Never Used  Substance and Sexual Activity  . Alcohol use: No  . Drug use: No  . Sexual activity: Yes  Lifestyle  . Physical activity    Days per week: Not on file    Minutes per session: Not on file  . Stress: Not on file  Relationships  . Social Herbalist on phone: Not on file    Gets together: Not on file    Attends religious service: Not on file    Active member of club or organization: Not on file    Attends meetings of clubs or organizations: Not on file    Relationship status: Not on file  . Intimate partner violence    Fear of current or ex partner: Not on file    Emotionally abused: Not on file    Physically abused: Not on file    Forced sexual activity: Not on file  Other Topics Concern  . Not on file  Social History Narrative  . Not on file   Family History  Problem Relation Age of Onset  . Coronary artery disease Brother        3 brothers had CABG  . Arrhythmia Brother   . Diabetes Brother   . Hearing loss Brother   . Hypertension Brother   . Heart disease Brother   . Heart failure Brother   . Hypertension Brother   . Heart disease Father   . Hypertension Father   . Sudden death Father   . Stroke Mother        cerebral hemorrage  . Colon cancer Mother 28  small intestine  . Cancer Mother   . Suicidality Sister   . Stroke Sister   . Diabetes Brother   . Hypertension Brother   . Hyperlipidemia Sister   . Esophageal cancer Neg Hx   . Pancreatic cancer Neg Hx   . Prostate cancer Neg Hx   . Rectal cancer Neg Hx   . Stomach cancer Neg Hx      Review of Systems  All other systems reviewed and are negative.      Objective:   Physical Exam  Constitutional: He is oriented to person, place, and time. He appears well-developed and well-nourished. No distress.  HENT:  Head: Normocephalic and atraumatic.   Right Ear: External ear normal.  Left Ear: External ear normal.  Nose: Nose normal.  Mouth/Throat: Oropharynx is clear and moist. No oropharyngeal exudate.  Eyes: Pupils are equal, round, and reactive to light. Conjunctivae and EOM are normal. Right eye exhibits no discharge. Left eye exhibits no discharge. No scleral icterus.  Neck: Normal range of motion. Neck supple. No JVD present. No tracheal deviation present. No thyromegaly present.  Cardiovascular: Normal rate, regular rhythm, normal heart sounds and intact distal pulses. Exam reveals no gallop and no friction rub.  No murmur heard. Pulmonary/Chest: Effort normal and breath sounds normal. No stridor. No respiratory distress. He has no wheezes. He has no rales. He exhibits no tenderness.  Abdominal: Soft. Bowel sounds are normal. He exhibits no distension and no mass. There is no abdominal tenderness. There is no rebound and no guarding.  Musculoskeletal: Normal range of motion.        General: No tenderness or edema.  Lymphadenopathy:    He has no cervical adenopathy.  Neurological: He is alert and oriented to person, place, and time. He has normal reflexes. No cranial nerve deficit. He exhibits normal muscle tone. Coordination normal.  Skin: Skin is warm. No rash noted. He is not diaphoretic. No erythema. No pallor.  Psychiatric: He has a normal mood and affect. His behavior is normal. Judgment and thought content normal.  Vitals reviewed. patient does have small actinic keratoses on his forehead, he sees Dr. [place] annually. Patient has scar tissue in the flexor tendon in the palm of his hand of his fourth digit on both hands and also the third digit on his right hand consistent with the contracture    Assessment & Plan:  The primary encounter diagnosis was Dupuytren contracture. Diagnoses of Benign essential HTN, Pure hypercholesterolemia, Atrial fibrillation, unspecified type [facility]), Routine general medical examination at a  health care facility, and Borderline diabetic were also pertinent to this visit. Diabetes test has worsened.  I have recommended a low carbohydrate diet exercise and weight loss and reassess lab work in 6 months.  If worsening beyond 7, I would then add metformin.  Blood pressure today is elevated however the patient checks it every day at home and states that his systolic blood pressure is never greater than 140.  He monitors this closely at home.  Cholesterol is acceptable.  His PSA is followed by his urologist.  He does not require colonoscopy due to his age.  I recommended a dermatology consultation due to the actinic keratosis on his right chest below his nipple that is concerning for possible squamous cell carcinoma.  I will consult a hand surgeon given the diffusions contractures on both hands that are now causing him pain.  I will give the patient a prednisone taper pack for 6 days for what sounds  like degenerative disc disease in his lower back.  If no improvement is seen at that point, I would recommend an MRI of the lumbar spine to evaluate for the possibility of epidural steroid injections.  Reassess lab work in 6 months

## 2019-02-20 DIAGNOSIS — M5136 Other intervertebral disc degeneration, lumbar region: Secondary | ICD-10-CM | POA: Diagnosis not present

## 2019-02-20 DIAGNOSIS — M9903 Segmental and somatic dysfunction of lumbar region: Secondary | ICD-10-CM | POA: Diagnosis not present

## 2019-02-20 DIAGNOSIS — M9905 Segmental and somatic dysfunction of pelvic region: Secondary | ICD-10-CM | POA: Diagnosis not present

## 2019-02-20 DIAGNOSIS — M720 Palmar fascial fibromatosis [Dupuytren]: Secondary | ICD-10-CM | POA: Diagnosis not present

## 2019-02-20 DIAGNOSIS — M5416 Radiculopathy, lumbar region: Secondary | ICD-10-CM | POA: Diagnosis not present

## 2019-02-21 ENCOUNTER — Other Ambulatory Visit: Payer: Self-pay | Admitting: Orthopedic Surgery

## 2019-02-21 ENCOUNTER — Telehealth: Payer: Self-pay | Admitting: *Deleted

## 2019-02-21 NOTE — Telephone Encounter (Signed)
   [facility] Pre-operative Risk Assessment    Request for surgical clearance:  1. What type of surgery is being performed? SEGMENTAL FASCIECTOMY OF LEFT RING FINGER  2. When is this surgery scheduled? 03/28/19  3. What type of clearance is required (medical clearance vs. Pharmacy clearance to hold med vs. Both)? MEDICAL  4. Are there any medications that need to be held prior to surgery and how long? PRADAXA  5. Practice name and name of physician performing surgery? [facility] [place]; DR. Fredna Dow  6. What is your office phone number [phone]   7.   What is your office fax number [phone] 8.   Anesthesia type (None, local, MAC, general) ? NOT LISTED    Julaine Hua 02/21/2019, 5:10 PM  _________________________________________________________________   (provider comments below)

## 2019-02-22 NOTE — Telephone Encounter (Signed)
Patient with diagnosis of Afib on Pradaxa for anticoagulation.    Procedure: SEGMENTAL FASCIECTOMY OF LEFT RING FINGER Date of procedure: 03/28/2019  CHADS2-VASc score of  4 (CHF, HTN, AGE,AGE,)  CrCl 46ml/min  Per office protocol, patient can hold Pradaxa for 3 days prior to procedure.

## 2019-02-22 NOTE — Telephone Encounter (Signed)
Pharm please address pradaxa thanks.

## 2019-02-23 NOTE — Telephone Encounter (Signed)
Left message to call back  Kerin Ransom PA-C 02/23/2019 8:46 AM

## 2019-02-24 NOTE — Telephone Encounter (Signed)
   Primary Cardiologist: Dr Marlou Porch  Chart reviewed and patient contacted by phone today as part of pre-operative protocol coverage. Given past medical history and time since last visit, based on ACC/AHA guidelines, Antonio Clark would be at acceptable risk for the planned procedure without further cardiovascular testing.   OK to hold Pradaxa 3 days pre op  I will route this recommendation to the requesting party via Epic fax function and remove from pre-op pool.  Please call with questions.  Kerin Ransom, PA-C 02/24/2019, 3:55 PM

## 2019-02-24 NOTE — Telephone Encounter (Signed)
Patient is returning call.  °

## 2019-03-20 ENCOUNTER — Telehealth: Payer: Self-pay | Admitting: Family Medicine

## 2019-03-20 MED ORDER — PREDNISONE 20 MG PO TABS: ORAL_TABLET | ORAL | 0 refills | Status: DC

## 2019-03-20 NOTE — Telephone Encounter (Signed)
Can have 1 more prednisone taper and if no better, recommend MRI

## 2019-03-20 NOTE — Telephone Encounter (Signed)
Pt called and states that the prednisone worked well but he is still having some pain in his lower back. He questioned a refill of pred or ???

## 2019-03-20 NOTE — Telephone Encounter (Signed)
LMTRC

## 2019-03-20 NOTE — Telephone Encounter (Signed)
Patient aware of providers recommendations and med sent to pharm 

## 2019-03-21 ENCOUNTER — Other Ambulatory Visit: Payer: Self-pay

## 2019-03-21 ENCOUNTER — Encounter (HOSPITAL_BASED_OUTPATIENT_CLINIC_OR_DEPARTMENT_OTHER): Payer: Self-pay | Admitting: *Deleted

## 2019-03-24 ENCOUNTER — Encounter (HOSPITAL_BASED_OUTPATIENT_CLINIC_OR_DEPARTMENT_OTHER)
Admission: RE | Admit: 2019-03-24 | Discharge: 2019-03-24 | Disposition: A | Discharge status: Home / Self Care | Payer: Medicare Other | Source: Ambulatory Visit | Attending: Orthopedic Surgery | Admitting: Orthopedic Surgery

## 2019-03-24 ENCOUNTER — Other Ambulatory Visit (HOSPITAL_COMMUNITY)
Admission: RE | Admit: 2019-03-24 | Discharge: 2019-03-24 | Disposition: A | Discharge status: Home / Self Care | Payer: Medicare Other | Source: Ambulatory Visit | Attending: Orthopedic Surgery | Admitting: Orthopedic Surgery

## 2019-03-24 ENCOUNTER — Telehealth: Payer: Self-pay | Admitting: Family Medicine

## 2019-03-24 ENCOUNTER — Other Ambulatory Visit: Payer: Self-pay

## 2019-03-24 DIAGNOSIS — Z01812 Encounter for preprocedural laboratory examination: Secondary | ICD-10-CM | POA: Insufficient documentation

## 2019-03-24 DIAGNOSIS — M720 Palmar fascial fibromatosis [Dupuytren]: Secondary | ICD-10-CM | POA: Diagnosis not present

## 2019-03-24 DIAGNOSIS — I2510 Atherosclerotic heart disease of native coronary artery without angina pectoris: Secondary | ICD-10-CM | POA: Diagnosis not present

## 2019-03-24 DIAGNOSIS — K219 Gastro-esophageal reflux disease without esophagitis: Secondary | ICD-10-CM | POA: Diagnosis not present

## 2019-03-24 DIAGNOSIS — Z20828 Contact with and (suspected) exposure to other viral communicable diseases: Secondary | ICD-10-CM | POA: Diagnosis not present

## 2019-03-24 DIAGNOSIS — I10 Essential (primary) hypertension: Secondary | ICD-10-CM | POA: Diagnosis not present

## 2019-03-24 DIAGNOSIS — Z8546 Personal history of malignant neoplasm of prostate: Secondary | ICD-10-CM | POA: Diagnosis not present

## 2019-03-24 DIAGNOSIS — G4730 Sleep apnea, unspecified: Secondary | ICD-10-CM | POA: Diagnosis not present

## 2019-03-24 LAB — BASIC METABOLIC PANEL
Anion gap: 12 (ref 5–15)
BUN: 30 mg/dL — ABNORMAL HIGH (ref 8–23)
CO2: 20 mmol/L — ABNORMAL LOW (ref 22–32)
Calcium: 9.1 mg/dL (ref 8.9–10.3)
Chloride: 102 mmol/L (ref 98–111)
Creatinine, Ser: 1.02 mg/dL (ref 0.61–1.24)
GFR calc Af Amer: >60 mL/min (ref >60)
GFR calc non Af Amer: >60 mL/min (ref >60)
Glucose, Bld: 169 mg/dL — ABNORMAL HIGH (ref 70–99)
Potassium: 4.4 mmol/L (ref 3.5–5.1)
Sodium: 134 mmol/L — ABNORMAL LOW (ref 135–145)

## 2019-03-24 LAB — SARS CORONAVIRUS 2 (TAT 6-24 HRS): SARS Coronavirus 2: NEGATIVE

## 2019-03-24 NOTE — Telephone Encounter (Signed)
Pt states that the prednisone worked wonders for his back and would like a note to go back to walking at [employer].  He goes to the [employer]

## 2019-03-24 NOTE — Progress Notes (Signed)

## 2019-03-27 ENCOUNTER — Encounter (HOSPITAL_BASED_OUTPATIENT_CLINIC_OR_DEPARTMENT_OTHER): Payer: Self-pay | Admitting: Anesthesiology

## 2019-03-27 NOTE — Anesthesia Preprocedure Evaluation (Addendum)
Anesthesia Evaluation  Patient identified by MRN, date of birth, ID band Patient awake    Reviewed: Allergy & Precautions, NPO status , Patient's Chart, lab work & pertinent test results  Airway Mallampati: II  TM Distance: >3 FB Neck ROM: Full    Dental no notable dental hx. (+) Caps   Pulmonary sleep apnea and Continuous Positive Airway Pressure Ventilation ,    Pulmonary exam normal breath sounds clear to auscultation       Cardiovascular hypertension, Pt. on medications + CAD  + dysrhythmias Atrial Fibrillation  Rhythm:Regular Rate:Normal  EKG 03/24/2019 NSR, RBBB  Echo 11/05/2017 Left ventricle: The cavity size was normal. There was mild focal basal hypertrophy of the septum. Systolic function was normal. The estimated ejection fraction was in the range of 50% to 55%. Wall motion was normal; there were no regional wall motion  abnormalities. Features are consistent with a pseudonormal left  ventricular filling pattern, with concomitant abnormal relaxation and increased filling pressure (grade 2 diastolic dysfunction). - Aortic valve: Trileaflet; moderately thickened, mildly calcified  leaflets. There was mild regurgitation. Regurgitation pressure half-time: 701 ms. - Aorta: Aortic root dimension: 38 mm (ED). - Aortic root: The aortic root was mildly dilated. - Mitral valve: Calcified annulus. There was trivial regurgitation. - Left atrium: The atrium was moderately dilated. - Right ventricle: The cavity size was mildly dilated. Wall   thickness was normal. - Atrial septum: There was increased thickness of the septum, consistent with lipomatous hypertrophy. - Tricuspid valve: There was trivial regurgitation. - Pulmonic valve: There was trivial regurgitation.    Neuro/Psych negative neurological ROS  negative psych ROS   GI/Hepatic Neg liver ROS, GERD  Medicated and Controlled,  Endo/Other  HLD  Renal/GU Renal  diseaseHx/o renal calculi   Hx/o prostate Ca    Musculoskeletal  (+) Arthritis , Osteoarthritis,    Abdominal (+) - obese,   Peds  Hematology Pradaxa therapy- last dose 8/11   Anesthesia Other Findings   Reproductive/Obstetrics                            Anesthesia Physical Anesthesia Plan  ASA: III  Anesthesia Plan: Regional and MAC   Post-op Pain Management:    Induction:   PONV Risk Score and Plan: 1 and Ondansetron, Treatment may vary due to age or medical condition and Propofol infusion  Airway Management Planned: Natural Airway  Additional Equipment:   Intra-op Plan:   Post-operative Plan: Extubation in OR  Informed Consent: I have reviewed the patients History and Physical, chart, labs and discussed the procedure including the risks, benefits and alternatives for the proposed anesthesia with the patient or authorized representative who has indicated his/her understanding and acceptance.     Dental advisory given  Plan Discussed with: CRNA and Surgeon  Anesthesia Plan Comments:        Anesthesia Quick Evaluation

## 2019-03-27 NOTE — Telephone Encounter (Signed)
Patient aware of providers recommendations and states that if he decides to go back to the [place] they are social distancing people but you have to have a MD note to go in.

## 2019-03-27 NOTE — Telephone Encounter (Signed)
Recommend waiting 2 weeks to allow back to heal.  Otherwise, would not recommend exercising in crowds such as a gym given his age and comorbidities.  Would be safer to walk outside in the early morning.  However, I am fine writing a note if he insists.

## 2019-03-28 ENCOUNTER — Encounter (HOSPITAL_BASED_OUTPATIENT_CLINIC_OR_DEPARTMENT_OTHER): Payer: Self-pay

## 2019-03-28 ENCOUNTER — Ambulatory Visit (HOSPITAL_BASED_OUTPATIENT_CLINIC_OR_DEPARTMENT_OTHER)
Admission: RE | Admit: 2019-03-28 | Discharge: 2019-03-28 | Disposition: A | Discharge status: Home / Self Care | Payer: Medicare Other | Attending: Orthopedic Surgery | Admitting: Orthopedic Surgery

## 2019-03-28 ENCOUNTER — Other Ambulatory Visit: Payer: Self-pay

## 2019-03-28 ENCOUNTER — Ambulatory Visit (HOSPITAL_BASED_OUTPATIENT_CLINIC_OR_DEPARTMENT_OTHER): Payer: Medicare Other | Admitting: Anesthesiology

## 2019-03-28 ENCOUNTER — Encounter (HOSPITAL_BASED_OUTPATIENT_CLINIC_OR_DEPARTMENT_OTHER)
Admission: RE | Disposition: A | Discharge status: Home / Self Care | Payer: Self-pay | Source: Home / Self Care | Attending: Orthopedic Surgery

## 2019-03-28 DIAGNOSIS — G4730 Sleep apnea, unspecified: Secondary | ICD-10-CM | POA: Diagnosis not present

## 2019-03-28 DIAGNOSIS — I2510 Atherosclerotic heart disease of native coronary artery without angina pectoris: Secondary | ICD-10-CM | POA: Insufficient documentation

## 2019-03-28 DIAGNOSIS — Z8546 Personal history of malignant neoplasm of prostate: Secondary | ICD-10-CM | POA: Diagnosis not present

## 2019-03-28 DIAGNOSIS — I10 Essential (primary) hypertension: Secondary | ICD-10-CM | POA: Insufficient documentation

## 2019-03-28 DIAGNOSIS — K219 Gastro-esophageal reflux disease without esophagitis: Secondary | ICD-10-CM | POA: Diagnosis not present

## 2019-03-28 DIAGNOSIS — M720 Palmar fascial fibromatosis [Dupuytren]: Secondary | ICD-10-CM | POA: Insufficient documentation

## 2019-03-28 DIAGNOSIS — C61 Malignant neoplasm of prostate: Secondary | ICD-10-CM | POA: Diagnosis not present

## 2019-03-28 HISTORY — DX: Cardiac arrhythmia, unspecified: I49.9

## 2019-03-28 HISTORY — PX: FASCIECTOMY: SHX6525

## 2019-03-28 HISTORY — DX: Gastro-esophageal reflux disease without esophagitis: K21.9

## 2019-03-28 HISTORY — DX: Sleep apnea, unspecified: G47.30

## 2019-03-28 HISTORY — DX: Unspecified osteoarthritis, unspecified site: M19.90

## 2019-03-28 HISTORY — DX: Prediabetes: R73.03

## 2019-03-28 SURGERY — FASCIECTOMY, PALM
Anesthesia: Monitor Anesthesia Care | Site: Hand | Laterality: Left

## 2019-03-28 MED ORDER — ONDANSETRON HCL 4 MG/2ML IJ SOLN: 4.0000 mg | Freq: Once | INTRAMUSCULAR | Status: DC | PRN

## 2019-03-28 MED ORDER — CEFAZOLIN SODIUM-DEXTROSE 2-4 GM/100ML-% IV SOLN
INTRAVENOUS | Status: AC
  Filled 2019-03-28: qty 100

## 2019-03-28 MED ORDER — BUPIVACAINE HCL (PF) 0.25 % IJ SOLN
INTRAMUSCULAR | Status: AC
  Filled 2019-03-28: qty 30

## 2019-03-28 MED ORDER — CHLORHEXIDINE GLUCONATE 4 % EX LIQD: 60.0000 mL | Freq: Once | CUTANEOUS | Status: DC

## 2019-03-28 MED ORDER — CEFAZOLIN SODIUM-DEXTROSE 2-4 GM/100ML-% IV SOLN: 2.0000 g | INTRAVENOUS | Status: DC

## 2019-03-28 MED ORDER — TRAMADOL HCL 50 MG PO TABS: 50.0000 mg | ORAL_TABLET | Freq: Four times a day (QID) | ORAL | 0 refills | Status: DC | PRN

## 2019-03-28 MED ORDER — FENTANYL CITRATE (PF) 100 MCG/2ML IJ SOLN
INTRAMUSCULAR | Status: AC
  Filled 2019-03-28: qty 2

## 2019-03-28 MED ORDER — MIDAZOLAM HCL 2 MG/2ML IJ SOLN
INTRAMUSCULAR | Status: AC
  Filled 2019-03-28: qty 2

## 2019-03-28 MED ORDER — ONDANSETRON HCL 4 MG/2ML IJ SOLN
INTRAMUSCULAR | Status: DC | PRN
  Administered 2019-03-28: 4 mg via INTRAVENOUS

## 2019-03-28 MED ORDER — FENTANYL CITRATE (PF) 100 MCG/2ML IJ SOLN: 25.0000 ug | INTRAMUSCULAR | Status: DC | PRN

## 2019-03-28 MED ORDER — SCOPOLAMINE 1 MG/3DAYS TD PT72: 1.0000 | MEDICATED_PATCH | Freq: Once | TRANSDERMAL | Status: DC

## 2019-03-28 MED ORDER — BUPIVACAINE-EPINEPHRINE (PF) 0.5% -1:200000 IJ SOLN
INTRAMUSCULAR | Status: DC | PRN
  Administered 2019-03-28: 30 mL via PERINEURAL

## 2019-03-28 MED ORDER — FENTANYL CITRATE (PF) 100 MCG/2ML IJ SOLN
50.0000 ug | INTRAMUSCULAR | Status: DC | PRN
  Administered 2019-03-28: 50 ug via INTRAVENOUS

## 2019-03-28 MED ORDER — PROPOFOL 500 MG/50ML IV EMUL
INTRAVENOUS | Status: DC | PRN
  Administered 2019-03-28: 75 ug/kg/min via INTRAVENOUS

## 2019-03-28 MED ORDER — LACTATED RINGERS IV SOLN
INTRAVENOUS | Status: DC
  Administered 2019-03-28: 08:00:00 via INTRAVENOUS

## 2019-03-28 MED ORDER — MIDAZOLAM HCL 2 MG/2ML IJ SOLN
1.0000 mg | INTRAMUSCULAR | Status: DC | PRN
  Administered 2019-03-28: 1 mg via INTRAVENOUS

## 2019-03-28 MED ORDER — PROPOFOL 10 MG/ML IV BOLUS
INTRAVENOUS | Status: DC | PRN
  Administered 2019-03-28: 20 mg via INTRAVENOUS

## 2019-03-28 SURGICAL SUPPLY — 42 items
BLADE MINI RND TIP GREEN BEAV (BLADE) ×3 IMPLANT
BLADE SURG 15 STRL LF DISP TIS (BLADE) ×2 IMPLANT
BLADE SURG 15 STRL SS (BLADE) ×4
BNDG COHESIVE 3X5 TAN STRL LF (GAUZE/BANDAGES/DRESSINGS) ×3 IMPLANT
BNDG ESMARK 4X9 LF (GAUZE/BANDAGES/DRESSINGS) ×3 IMPLANT
BNDG GAUZE ELAST 4 BULKY (GAUZE/BANDAGES/DRESSINGS) ×3 IMPLANT
CHLORAPREP W/TINT 26 (MISCELLANEOUS) ×3 IMPLANT
CORD BIPOLAR FORCEPS 12FT (ELECTRODE) ×3 IMPLANT
COVER BACK TABLE REUSABLE LG (DRAPES) ×3 IMPLANT
COVER MAYO STAND REUSABLE (DRAPES) ×3 IMPLANT
COVER WAND RF STERILE (DRAPES) IMPLANT
CUFF TOURN SGL QUICK 18X4 (TOURNIQUET CUFF) IMPLANT
DECANTER SPIKE VIAL GLASS SM (MISCELLANEOUS) IMPLANT
DRAPE EXTREMITY T 121X128X90 (DISPOSABLE) ×3 IMPLANT
DRAPE SURG 17X23 STRL (DRAPES) ×3 IMPLANT
GAUZE SPONGE 4X4 12PLY STRL (GAUZE/BANDAGES/DRESSINGS) ×3 IMPLANT
GAUZE XEROFORM 1X8 LF (GAUZE/BANDAGES/DRESSINGS) ×3 IMPLANT
GLOVE BIOGEL PI IND STRL 8 (GLOVE) ×2 IMPLANT
GLOVE BIOGEL PI IND STRL 8.5 (GLOVE) ×1 IMPLANT
GLOVE BIOGEL PI INDICATOR 8 (GLOVE) ×4
GLOVE BIOGEL PI INDICATOR 8.5 (GLOVE) ×2
GLOVE ECLIPSE 8.0 STRL XLNG CF (GLOVE) ×6 IMPLANT
GLOVE SURG ORTHO 8.0 STRL STRW (GLOVE) ×3 IMPLANT
GOWN STRL REUS W/ TWL LRG LVL3 (GOWN DISPOSABLE) ×2 IMPLANT
GOWN STRL REUS W/TWL LRG LVL3 (GOWN DISPOSABLE) ×4
GOWN STRL REUS W/TWL XL LVL3 (GOWN DISPOSABLE) ×6 IMPLANT
LOOP VESSEL MAXI BLUE (MISCELLANEOUS) ×3 IMPLANT
NEEDLE PRECISIONGLIDE 27X1.5 (NEEDLE) ×3 IMPLANT
NS IRRIG 1000ML POUR BTL (IV SOLUTION) ×3 IMPLANT
PACK BASIN DAY SURGERY FS (CUSTOM PROCEDURE TRAY) ×3 IMPLANT
PAD CAST 3X4 CTTN HI CHSV (CAST SUPPLIES) ×1 IMPLANT
PADDING CAST COTTON 3X4 STRL (CAST SUPPLIES) ×2
SLEEVE SCD COMPRESS KNEE MED (MISCELLANEOUS) ×3 IMPLANT
SPLINT PLASTER CAST XFAST 3X15 (CAST SUPPLIES) ×10 IMPLANT
SPLINT PLASTER XTRA FASTSET 3X (CAST SUPPLIES) ×20
STOCKINETTE 4X48 STRL (DRAPES) ×3 IMPLANT
SUT ETHILON 4 0 PS 2 18 (SUTURE) ×3 IMPLANT
SUT SILK 2 0 PERMA HAND 18 BK (SUTURE) IMPLANT
SYR BULB 3OZ (MISCELLANEOUS) ×3 IMPLANT
SYR CONTROL 10ML LL (SYRINGE) ×3 IMPLANT
TOWEL GREEN STERILE FF (TOWEL DISPOSABLE) ×6 IMPLANT
UNDERPAD 30X30 (UNDERPADS AND DIAPERS) ×3 IMPLANT

## 2019-03-28 NOTE — Discharge Instructions (Addendum)

## 2019-03-28 NOTE — Anesthesia Procedure Notes (Signed)
Anesthesia Regional Block: Axillary brachial plexus block   Pre-Anesthetic Checklist: ,, timeout performed, Correct Patient, Correct Site, Correct Laterality, Correct Procedure, Correct Position, site marked, Risks and benefits discussed,  Surgical consent,  Pre-op evaluation,  At surgeon's request and post-op pain management  Laterality: Left  Prep: chloraprep       Needles:  Injection technique: Single-shot  Needle Type: Echogenic Stimulator Needle     Needle Length: 9cm  Needle Gauge: 21   Needle insertion depth: 6 cm   Additional Needles:   Procedures:,,,, ultrasound used (permanent image in chart),,,,  Motor weakness within 8 minutes.  Narrative:  Start time: 03/28/2019 7:52 AM End time: 03/28/2019 7:57 AM Injection made incrementally with aspirations every 5 mL.  Performed by: Personally  Anesthesiologist: Josephine Igo, MD  Additional Notes: Timeout performed. Patient sedated. Relevant anatomy ID'd using [place]. Incremental 2-27ml injection of LA with frequent aspiration. Patient tolerated procedure well.        Left Axilary Block

## 2019-03-28 NOTE — Op Note (Signed)
NAME: Antonio Clark [facility] MEDICAL RECORD NO: [id number] DATE OF BIRTH: 09/21/1935 FAC[name] LOCATION: [facility] PHYSICIAN: Wynonia Sours, MD   OPERATIVE REPORT   DATE OF PROCEDURE: 03/28/19    PREOPERATIVE DIAGNOSIS:   Dupuytren's contracture left ring finger   POSTOPERATIVE DIAGNOSIS:   Same   PROCEDURE:   Segmental fasciectomy palmar fascia left ring finger   SURGEON: Daryll Brod, M.D.   ASSISTANT: Leverne Humbles, [facility]   ANESTHESIA:  Regional with sedation   INTRAVENOUS FLUIDS:  Per anesthesia flow sheet.   ESTIMATED BLOOD LOSS:  Minimal.   COMPLICATIONS:  None.   SPECIMENS:   Palmar fascia   TOURNIQUET TIME:    Total Tourniquet Time Documented: Upper Arm (Left) - 28 minutes Total: Upper Arm (Left) - 28 minutes    DISPOSITION:  Stable to PACU.   INDICATIONS: Patient is an 83 year old male with a history of Dupuytren's contracture with contracture of his left ring finger which has progressed over the past year.  He is desirous having this removed to straighten his finger to the extent necessary.  Pre-peri-and postoperative course been discussed along with risks and complications he is aware that there is no guarantee to the surgery the possibility of infection recurrence injury to arteries nerves tendons complete relief symptoms and dystrophy.  The preoperative area the patient is seen extremity marked by both patient and surgeon antibiotic given a supraclavicular block was carried out without difficulty in the preoperative area.  OPERATIVE COURSE: He was brought to the operating room placed in a supine position prepped and draped with the left arm free.  A three-minute dry time was allowed timeout taken confirming patient procedure after ChloraPrep prep prep.  The limb was exsanguinated with an Esmarch bandage turn placed high in the arm was inflated to 275 mmHg.  Transverse incisions were made beginning at the junction of the palmar fascia with the flexor  retinaculum.  This carried down through subcutaneous tissue.  Bleeders were electrocauterized with bipolar.  The cord to the ring finger was isolated.  This was dissected free from the underlying neurovascular structures flexor tendons and transected with sharp dissection.  A second incision was made at the mid palm again carried down through subcutaneous tissue.  The cord was isolated from the flexor tendon neurovascular bundle radial and ulnarly which were protected retractors were placed.  A 1-1/2 cm segment of cord and palmar fascia was removed and sent to pathology.  This allowed the metacarpal phalangeal joint to come fully straight.  A transverse incision was then made at the metacarpal flexion crease of the finger carried down through subcutaneous tissue this was a large central radial cord.  Neurovascular bundles were identified both radially and ulnarly the skin was then dissected free and a segment approximately a centimeter was removed taking care to protect neurovascular bundle flexor tendons.  Distal of the PIP joint, to within 5 degrees of full extension.  The cord proceeded on the radial aspect of the finger.  A fourth incision was made over the PIP area.  This was carried down again through subcutaneous tissue with blunt sharp dissection it was dissected free from each neurovascular bundle which was protected and a segment removed approximately a centimeter in length.  This allowed the finger to come fully straight.  The wounds were irrigated the neurovascular bundles were identified and found to be intact in each of the incisions.  The wounds were copiously irrigated with saline the distal wound was able  to be closed the mid wound was able to be closed in the palm the other 2 wounds were left open sterile compressive dressing splint was applied and deflation the tourniquet all fingers immediately pink.  He was taken to the recovery room for observation in satisfactory condition.  Will be discharged  home to return the hand center [facility] in 1 week on Tylenol ibuprofen for pain with Ultram for breakthrough.   Daryll Brod, MD Electronically signed, 03/28/19

## 2019-03-28 NOTE — Transfer of Care (Signed)
Immediate Anesthesia Transfer of Care Note  Patient: Antonio Clark  Procedure(s) Performed: SEGMENTAL FASCIECTOMY LEFT RING FINGER (Left Hand)  Patient Location: PACU  Anesthesia Type:MAC combined with regional for post-op pain  Level of Consciousness: awake, alert  and oriented  Airway & Oxygen Therapy: Patient Spontanous Breathing and Patient connected to face mask oxygen  Post-op Assessment: Report given to RN and Post -op Vital signs reviewed and stable  Post vital signs: Reviewed and stable  Last Vitals:  Vitals Value Taken Time  BP    Temp    Pulse 74 03/28/19 0932  Resp 22 03/28/19 0932  SpO2 98 % 03/28/19 0932  Vitals shown include unvalidated device data.  Last Pain:  Vitals:   03/28/19 0707  TempSrc: Oral  PainSc: 4       Patients Stated Pain Goal: 4 (27/61/47 0929)  Complications: No apparent anesthesia complications

## 2019-03-28 NOTE — Anesthesia Postprocedure Evaluation (Signed)
Anesthesia Post Note  Patient: Antonio Clark  Procedure(s) Performed: SEGMENTAL FASCIECTOMY LEFT RING FINGER (Left Hand)     Patient location during evaluation: PACU Anesthesia Type: Regional and MAC Level of consciousness: awake and alert and oriented Pain management: pain level controlled Vital Signs Assessment: post-procedure vital signs reviewed and stable Respiratory status: spontaneous breathing, nonlabored ventilation and respiratory function stable Cardiovascular status: stable and blood pressure returned to baseline Postop Assessment: no apparent nausea or vomiting Anesthetic complications: no    Last Vitals:  Vitals:   03/28/19 0945 03/28/19 0949  BP: (!) 124/54   Pulse: 68 66  Resp: (!) 22 (!) 21  Temp:    SpO2: 97% 100%    Last Pain:  Vitals:   03/28/19 0949  TempSrc:   PainSc: 0-No pain                 Kamal Jurgens A.

## 2019-03-28 NOTE — Brief Op Note (Signed)
03/28/2019  9:26 AM  PATIENT:  Antonio Clark  83 y.o. male  PRE-OPERATIVE DIAGNOSIS:  DUPUYTRENS CONTRACTURE LEFT RING FINGER  POST-OPERATIVE DIAGNOSIS:  DUPUYTRENS CONTRACTURE LEFT RING FINGER  PROCEDURE:  Procedure(s) with comments: SEGMENTAL FASCIECTOMY LEFT RING FINGER (Left) - ANESTHESIA  AXILLARY BLOCK  SURGEON:  Surgeon(s) and Role:    Daryll Brod, MD - Primary  PHYSICIAN ASSISTANT:   ASSISTANTS: Leverne Humbles PA-C  ANESTHESIA:   regional and IV sedation  EBL: 76ml BLOOD ADMINISTERED:none  DRAINS: none   LOCAL MEDICATIONS USED:  NONE  SPECIMEN:  Excision  DISPOSITION OF SPECIMEN:  PATHOLOGY  COUNTS:  YES  TOURNIQUET:   Total Tourniquet Time Documented: Upper Arm (Left) - 28 minutes Total: Upper Arm (Left) - 28 minutes   DICTATION: .Viviann Spare Dictation  PLAN OF CARE: Discharge to home after PACU  PATIENT DISPOSITION:  PACU - hemodynamically stable.

## 2019-03-28 NOTE — Progress Notes (Signed)
Assisted Dr. Foster with left, ultrasound guided, axillary block. Side rails up, monitors on throughout procedure. See vital signs in flow sheet. Tolerated Procedure well. 

## 2019-03-28 NOTE — H&P (Signed)
Antonio Clark is an 83 y.o. male.   Chief Complaint: finger contracture left ring HPI: Antonio Clark is a 83 year old right-hand-dominant male referred by Antonio. Cindi Clark for consultation regarding flexion deformities of his ring fingers bilaterally. The right has been going on for years the left over the past 3 to 4 months. He has no history of injury. Is not complaining any pain or discomfort. He has siblings 4 brothers all of whom had similar problems he is of [place] descent. He states his mother had similar problems he does not have lumps on his feet or penis. He is borderline diabetic with no history of thyroid problems arthritis or gout. Family history is positive diabetes negative for thyroid problems arthritis and gout.   Past Medical History:  Diagnosis Date  . Arthritis   . Atrial fibrillation ([place])   . Borderline diabetic   . Dysrhythmia    a-fib  . GERD (gastroesophageal reflux disease)   . History of echocardiogram 07/ 07/ 2011  . History of lithotripsy 1989  . Hyperlipidemia   . Hypertension   . Pre-diabetes   . Prostate CA ([place]) 2010  . Sleep apnea    uses CPAP nightly    Past Surgical History:  Procedure Laterality Date  . COLONOSCOPY  2002  . INSERTION PROSTATE RADIATION SEED  8 4 2010   per Antonio Clark  . POLYPECTOMY  2002  . THROAT SURGERY  1980   benign cyst  . URETHRAL STRICTURE DILATATION     also penile implant    Family History  Problem Relation Age of Onset  . Coronary artery disease Brother        3 brothers had CABG  . Arrhythmia Brother   . Diabetes Brother   . Hearing loss Brother   . Hypertension Brother   . Heart disease Brother   . Heart failure Brother   . Hypertension Brother   . Heart disease Father   . Hypertension Father   . Sudden death Father   . Stroke Mother        cerebral hemorrage  . Colon cancer Mother 3       small intestine  . Cancer Mother   . Suicidality Sister   . Stroke Sister   . Diabetes Brother   . Hypertension  Brother   . Hyperlipidemia Sister   . Esophageal cancer Neg Hx   . Pancreatic cancer Neg Hx   . Prostate cancer Neg Hx   . Rectal cancer Neg Hx   . Stomach cancer Neg Hx    Social History:  reports that he has never smoked. He has never used smokeless tobacco. He reports current alcohol use. He reports that he does not use drugs.  Allergies: No Known Allergies  No medications prior to admission.    No results found for this or any previous visit (from the past 48 hour(s)).  No results found.   Pertinent items are noted in HPI.  Height 5\' 9"  (1.753 m), weight 86.2 kg.  General appearance: alert, cooperative and appears stated age Head: Normocephalic, without obvious abnormality Neck: no JVD Resp: clear to auscultation bilaterally Cardio: regular rate and rhythm, S1, S2 normal, no murmur, click, rub or gallop GI: soft, non-tender; bowel sounds normal; no masses,  no organomegaly Extremities: finger contracture left ring Pulses: 2+ and symmetric Skin: Skin color, texture, turgor normal. No rashes or lesions Neurologic: Grossly normal Incision/Wound: na  Assessment/Plan  Assessment:  1. Contracture of palmar fascia  Plan: We have discussed that he has every indication to proceed with intervention with regards to the Dupuytren's. We have discussed various alternatives including Xiaflex injection fasciotomy fasciectomy aponeurotomy we like to proceed with a segmental fasciectomy to his left ring finger. Pre-peri-and postoperative course are discussed along with risk complications. He is aware that there is no guarantee is surgery the possibility of infection recurrence injury to arteries nerves tendons complete relief of symptoms dystrophy possibility of incomplete extension the possibility loss of the finger the possibility of neurovascular injuries. Is aware that he will have open areas that will take period of time to heal. He would like to proceed in this scheduled as an  outpatient under regional anesthesia for segmental fasciectomy left ring finger.   Antonio Clark 03/28/2019, 5:48 AM

## 2019-03-29 ENCOUNTER — Encounter (HOSPITAL_BASED_OUTPATIENT_CLINIC_OR_DEPARTMENT_OTHER): Payer: Self-pay | Admitting: Orthopedic Surgery

## 2019-04-02 ENCOUNTER — Other Ambulatory Visit: Payer: Self-pay | Admitting: Cardiology

## 2019-04-27 DIAGNOSIS — Z23 Encounter for immunization: Secondary | ICD-10-CM | POA: Diagnosis not present

## 2019-05-01 ENCOUNTER — Other Ambulatory Visit: Payer: Self-pay | Admitting: Dermatology

## 2019-05-01 DIAGNOSIS — D0461 Carcinoma in situ of skin of right upper limb, including shoulder: Secondary | ICD-10-CM | POA: Diagnosis not present

## 2019-05-01 DIAGNOSIS — C439 Malignant melanoma of skin, unspecified: Secondary | ICD-10-CM

## 2019-05-01 DIAGNOSIS — L570 Actinic keratosis: Secondary | ICD-10-CM | POA: Diagnosis not present

## 2019-05-01 DIAGNOSIS — D044 Carcinoma in situ of skin of scalp and neck: Secondary | ICD-10-CM | POA: Diagnosis not present

## 2019-05-01 DIAGNOSIS — C4359 Malignant melanoma of other part of trunk: Secondary | ICD-10-CM | POA: Diagnosis not present

## 2019-05-01 HISTORY — DX: Malignant melanoma of skin, unspecified: C43.9

## 2019-05-08 DIAGNOSIS — M720 Palmar fascial fibromatosis [Dupuytren]: Secondary | ICD-10-CM | POA: Diagnosis not present

## 2019-05-08 DIAGNOSIS — M25442 Effusion, left hand: Secondary | ICD-10-CM | POA: Diagnosis not present

## 2019-05-08 DIAGNOSIS — R29898 Other symptoms and signs involving the musculoskeletal system: Secondary | ICD-10-CM | POA: Diagnosis not present

## 2019-05-08 DIAGNOSIS — M25642 Stiffness of left hand, not elsewhere classified: Secondary | ICD-10-CM | POA: Diagnosis not present

## 2019-05-10 DIAGNOSIS — M9905 Segmental and somatic dysfunction of pelvic region: Secondary | ICD-10-CM | POA: Diagnosis not present

## 2019-05-10 DIAGNOSIS — M5136 Other intervertebral disc degeneration, lumbar region: Secondary | ICD-10-CM | POA: Diagnosis not present

## 2019-05-10 DIAGNOSIS — M5416 Radiculopathy, lumbar region: Secondary | ICD-10-CM | POA: Diagnosis not present

## 2019-05-10 DIAGNOSIS — M9903 Segmental and somatic dysfunction of lumbar region: Secondary | ICD-10-CM | POA: Diagnosis not present

## 2019-05-19 DIAGNOSIS — M25442 Effusion, left hand: Secondary | ICD-10-CM | POA: Diagnosis not present

## 2019-05-19 DIAGNOSIS — M25642 Stiffness of left hand, not elsewhere classified: Secondary | ICD-10-CM | POA: Diagnosis not present

## 2019-05-19 DIAGNOSIS — M720 Palmar fascial fibromatosis [Dupuytren]: Secondary | ICD-10-CM | POA: Diagnosis not present

## 2019-05-19 DIAGNOSIS — R29898 Other symptoms and signs involving the musculoskeletal system: Secondary | ICD-10-CM | POA: Diagnosis not present

## 2019-05-22 DIAGNOSIS — M9903 Segmental and somatic dysfunction of lumbar region: Secondary | ICD-10-CM | POA: Diagnosis not present

## 2019-05-22 DIAGNOSIS — M5136 Other intervertebral disc degeneration, lumbar region: Secondary | ICD-10-CM | POA: Diagnosis not present

## 2019-05-22 DIAGNOSIS — M5416 Radiculopathy, lumbar region: Secondary | ICD-10-CM | POA: Diagnosis not present

## 2019-05-22 DIAGNOSIS — M9905 Segmental and somatic dysfunction of pelvic region: Secondary | ICD-10-CM | POA: Diagnosis not present

## 2019-05-23 DIAGNOSIS — M5416 Radiculopathy, lumbar region: Secondary | ICD-10-CM | POA: Diagnosis not present

## 2019-05-23 DIAGNOSIS — M25442 Effusion, left hand: Secondary | ICD-10-CM | POA: Diagnosis not present

## 2019-05-23 DIAGNOSIS — R29898 Other symptoms and signs involving the musculoskeletal system: Secondary | ICD-10-CM | POA: Diagnosis not present

## 2019-05-23 DIAGNOSIS — M9903 Segmental and somatic dysfunction of lumbar region: Secondary | ICD-10-CM | POA: Diagnosis not present

## 2019-05-23 DIAGNOSIS — M9905 Segmental and somatic dysfunction of pelvic region: Secondary | ICD-10-CM | POA: Diagnosis not present

## 2019-05-23 DIAGNOSIS — M5136 Other intervertebral disc degeneration, lumbar region: Secondary | ICD-10-CM | POA: Diagnosis not present

## 2019-05-23 DIAGNOSIS — M720 Palmar fascial fibromatosis [Dupuytren]: Secondary | ICD-10-CM | POA: Diagnosis not present

## 2019-05-23 DIAGNOSIS — M25642 Stiffness of left hand, not elsewhere classified: Secondary | ICD-10-CM | POA: Diagnosis not present

## 2019-05-24 DIAGNOSIS — M9905 Segmental and somatic dysfunction of pelvic region: Secondary | ICD-10-CM | POA: Diagnosis not present

## 2019-05-24 DIAGNOSIS — M9903 Segmental and somatic dysfunction of lumbar region: Secondary | ICD-10-CM | POA: Diagnosis not present

## 2019-05-24 DIAGNOSIS — M5136 Other intervertebral disc degeneration, lumbar region: Secondary | ICD-10-CM | POA: Diagnosis not present

## 2019-05-24 DIAGNOSIS — M5416 Radiculopathy, lumbar region: Secondary | ICD-10-CM | POA: Diagnosis not present

## 2019-05-29 DIAGNOSIS — M9905 Segmental and somatic dysfunction of pelvic region: Secondary | ICD-10-CM | POA: Diagnosis not present

## 2019-05-29 DIAGNOSIS — M5416 Radiculopathy, lumbar region: Secondary | ICD-10-CM | POA: Diagnosis not present

## 2019-05-29 DIAGNOSIS — M9903 Segmental and somatic dysfunction of lumbar region: Secondary | ICD-10-CM | POA: Diagnosis not present

## 2019-05-29 DIAGNOSIS — M5136 Other intervertebral disc degeneration, lumbar region: Secondary | ICD-10-CM | POA: Diagnosis not present

## 2019-05-30 DIAGNOSIS — M5416 Radiculopathy, lumbar region: Secondary | ICD-10-CM | POA: Diagnosis not present

## 2019-05-30 DIAGNOSIS — M9905 Segmental and somatic dysfunction of pelvic region: Secondary | ICD-10-CM | POA: Diagnosis not present

## 2019-05-30 DIAGNOSIS — M5136 Other intervertebral disc degeneration, lumbar region: Secondary | ICD-10-CM | POA: Diagnosis not present

## 2019-05-30 DIAGNOSIS — M9903 Segmental and somatic dysfunction of lumbar region: Secondary | ICD-10-CM | POA: Diagnosis not present

## 2019-05-31 DIAGNOSIS — M25442 Effusion, left hand: Secondary | ICD-10-CM | POA: Diagnosis not present

## 2019-05-31 DIAGNOSIS — R29898 Other symptoms and signs involving the musculoskeletal system: Secondary | ICD-10-CM | POA: Diagnosis not present

## 2019-05-31 DIAGNOSIS — M720 Palmar fascial fibromatosis [Dupuytren]: Secondary | ICD-10-CM | POA: Diagnosis not present

## 2019-05-31 DIAGNOSIS — M25642 Stiffness of left hand, not elsewhere classified: Secondary | ICD-10-CM | POA: Diagnosis not present

## 2019-06-03 ENCOUNTER — Other Ambulatory Visit: Payer: Self-pay | Admitting: General Surgery

## 2019-06-05 ENCOUNTER — Other Ambulatory Visit: Payer: Self-pay | Admitting: General Surgery

## 2019-06-05 DIAGNOSIS — M5136 Other intervertebral disc degeneration, lumbar region: Secondary | ICD-10-CM | POA: Diagnosis not present

## 2019-06-05 DIAGNOSIS — C4359 Malignant melanoma of other part of trunk: Secondary | ICD-10-CM | POA: Diagnosis not present

## 2019-06-05 DIAGNOSIS — M9903 Segmental and somatic dysfunction of lumbar region: Secondary | ICD-10-CM | POA: Diagnosis not present

## 2019-06-05 DIAGNOSIS — M5416 Radiculopathy, lumbar region: Secondary | ICD-10-CM | POA: Diagnosis not present

## 2019-06-05 DIAGNOSIS — M9905 Segmental and somatic dysfunction of pelvic region: Secondary | ICD-10-CM | POA: Diagnosis not present

## 2019-06-07 DIAGNOSIS — M5416 Radiculopathy, lumbar region: Secondary | ICD-10-CM | POA: Diagnosis not present

## 2019-06-07 DIAGNOSIS — M5136 Other intervertebral disc degeneration, lumbar region: Secondary | ICD-10-CM | POA: Diagnosis not present

## 2019-06-07 DIAGNOSIS — M9905 Segmental and somatic dysfunction of pelvic region: Secondary | ICD-10-CM | POA: Diagnosis not present

## 2019-06-07 DIAGNOSIS — M9903 Segmental and somatic dysfunction of lumbar region: Secondary | ICD-10-CM | POA: Diagnosis not present

## 2019-06-12 DIAGNOSIS — M5136 Other intervertebral disc degeneration, lumbar region: Secondary | ICD-10-CM | POA: Diagnosis not present

## 2019-06-12 DIAGNOSIS — M5416 Radiculopathy, lumbar region: Secondary | ICD-10-CM | POA: Diagnosis not present

## 2019-06-12 DIAGNOSIS — M9903 Segmental and somatic dysfunction of lumbar region: Secondary | ICD-10-CM | POA: Diagnosis not present

## 2019-06-12 DIAGNOSIS — M9905 Segmental and somatic dysfunction of pelvic region: Secondary | ICD-10-CM | POA: Diagnosis not present

## 2019-06-14 ENCOUNTER — Other Ambulatory Visit: Payer: Self-pay | Admitting: *Deleted

## 2019-06-14 ENCOUNTER — Telehealth: Payer: Self-pay | Admitting: Cardiology

## 2019-06-14 DIAGNOSIS — M5136 Other intervertebral disc degeneration, lumbar region: Secondary | ICD-10-CM | POA: Diagnosis not present

## 2019-06-14 DIAGNOSIS — M9903 Segmental and somatic dysfunction of lumbar region: Secondary | ICD-10-CM | POA: Diagnosis not present

## 2019-06-14 DIAGNOSIS — M9905 Segmental and somatic dysfunction of pelvic region: Secondary | ICD-10-CM | POA: Diagnosis not present

## 2019-06-14 DIAGNOSIS — M5416 Radiculopathy, lumbar region: Secondary | ICD-10-CM | POA: Diagnosis not present

## 2019-06-14 MED ORDER — HYDROCHLOROTHIAZIDE 25 MG PO TABS: 25.0000 mg | ORAL_TABLET | Freq: Every day | ORAL | Status: DC

## 2019-06-14 MED ORDER — HYDROCHLOROTHIAZIDE 25 MG PO TABS: 25.0000 mg | ORAL_TABLET | Freq: Every day | ORAL | 9 refills | Status: DC

## 2019-06-14 NOTE — Telephone Encounter (Signed)
Pt c/o BP issue: STAT if pt c/o blurred vision, one-sided weakness or slurred speech  1. What are your last 5 BP readings?  137/74 175/75 167/67 171/71 166/72  2. Are you having any other symptoms (ex. Dizziness, headache, blurred vision, passed out)? no  3. What is your BP issue? Patient states he feels fine but has elevated BP that he would like to discuss with a nurse.

## 2019-06-14 NOTE — Telephone Encounter (Signed)
S/w pt is aware of Lori's recommendation's.  Pt will increase HCTZ to one tablet (25 mg ) daily, sent in today to requested pharmacy # [id number].  Pt will keep diary of bp and bring to office along with bp cuff the day of pt's upcoming appt with Leisa Lenz, NP.  Pt did state has upcoming surgery on 11/12 for melanoma on chest.  Waiting for instructions on holding pradaxa.

## 2019-06-14 NOTE — Telephone Encounter (Signed)
Would increase the HCTZ to 25 mg a day - will plan to repeat BMET on return visit.  Continue to monitor BP in the interim.  Restrict salt.   Cecille Rubin

## 2019-06-16 ENCOUNTER — Other Ambulatory Visit: Payer: Self-pay

## 2019-06-16 ENCOUNTER — Encounter (HOSPITAL_BASED_OUTPATIENT_CLINIC_OR_DEPARTMENT_OTHER): Payer: Self-pay | Admitting: *Deleted

## 2019-06-19 ENCOUNTER — Other Ambulatory Visit (HOSPITAL_COMMUNITY)
Admission: RE | Admit: 2019-06-19 | Discharge: 2019-06-19 | Disposition: A | Discharge status: Home / Self Care | Payer: Medicare Other | Source: Ambulatory Visit | Attending: General Surgery | Admitting: General Surgery

## 2019-06-19 ENCOUNTER — Other Ambulatory Visit: Payer: Self-pay

## 2019-06-19 ENCOUNTER — Encounter (HOSPITAL_BASED_OUTPATIENT_CLINIC_OR_DEPARTMENT_OTHER)
Admission: RE | Admit: 2019-06-19 | Discharge: 2019-06-19 | Disposition: A | Discharge status: Home / Self Care | Payer: Medicare Other | Source: Ambulatory Visit | Attending: General Surgery | Admitting: General Surgery

## 2019-06-19 DIAGNOSIS — Z01812 Encounter for preprocedural laboratory examination: Secondary | ICD-10-CM | POA: Diagnosis not present

## 2019-06-19 DIAGNOSIS — Z20828 Contact with and (suspected) exposure to other viral communicable diseases: Secondary | ICD-10-CM | POA: Diagnosis not present

## 2019-06-19 LAB — CBC WITH DIFFERENTIAL/PLATELET
Abs Immature Granulocytes: 0.01 10*3/uL (ref 0.00–0.07)
Basophils Absolute: 0 10*3/uL (ref 0.0–0.1)
Basophils Relative: 1 %
Eosinophils Absolute: 0.3 10*3/uL (ref 0.0–0.5)
Eosinophils Relative: 5 %
HCT: 41.3 % (ref 39.0–52.0)
Hemoglobin: 14 g/dL (ref 13.0–17.0)
Immature Granulocytes: 0 %
Lymphocytes Relative: 32 %
Lymphs Abs: 1.7 10*3/uL (ref 0.7–4.0)
MCH: 30.5 pg (ref 26.0–34.0)
MCHC: 33.9 g/dL (ref 30.0–36.0)
MCV: 90 fL (ref 80.0–100.0)
Monocytes Absolute: 0.5 10*3/uL (ref 0.1–1.0)
Monocytes Relative: 9 %
Neutro Abs: 2.8 10*3/uL (ref 1.7–7.7)
Neutrophils Relative %: 53 %
Platelets: 146 10*3/uL — ABNORMAL LOW (ref 150–400)
RBC: 4.59 MIL/uL (ref 4.22–5.81)
RDW: 12.7 % (ref 11.5–15.5)
WBC: 5.2 10*3/uL (ref 4.0–10.5)
nRBC: 0 % (ref 0.0–0.2)

## 2019-06-19 LAB — COMPREHENSIVE METABOLIC PANEL
ALT: 20 U/L (ref 0–44)
AST: 22 U/L (ref 15–41)
Albumin: 4.2 g/dL (ref 3.5–5.0)
Alkaline Phosphatase: 45 U/L (ref 38–126)
Anion gap: 11 (ref 5–15)
BUN: 18 mg/dL (ref 8–23)
CO2: 22 mmol/L (ref 22–32)
Calcium: 9.4 mg/dL (ref 8.9–10.3)
Chloride: 105 mmol/L (ref 98–111)
Creatinine, Ser: 1.16 mg/dL (ref 0.61–1.24)
GFR calc Af Amer: >60 mL/min (ref >60)
GFR calc non Af Amer: 59 mL/min — ABNORMAL LOW (ref >60)
Glucose, Bld: 168 mg/dL — ABNORMAL HIGH (ref 70–99)
Potassium: 4.6 mmol/L (ref 3.5–5.1)
Sodium: 138 mmol/L (ref 135–145)
Total Bilirubin: 0.7 mg/dL (ref 0.3–1.2)
Total Protein: 6.7 g/dL (ref 6.5–8.1)

## 2019-06-19 NOTE — Progress Notes (Signed)

## 2019-06-20 LAB — NOVEL CORONAVIRUS, NAA (HOSP ORDER, SEND-OUT TO REF LAB; TAT 18-24 HRS): SARS-CoV-2, NAA: NOT DETECTED

## 2019-06-21 NOTE — H&P (Signed)
[name] Appointment: 06/05/2019 9:45 AM Location: [facility] Patient #: [id number] DOB: [birth year] Single / Language: [name] / Race: Ciotti Male   History of Present Illness Stark Klein MD; 06/05/2019 9:50 AM) The patient is a 83 year old male who presents with malignant melanoma. Pt is an 83 yo M who is referred by Dr. Denna Haggard with a new diagnosis of malignant melanoma of the right chest wall. He presented with an abnormal lesion on his right chest wall. He stated that it was just reddish, but was getting larger. Dr. Denna Haggard told him that without the history of the recent change, he might not have biopsied it. He has history of basal cell and squamous cell carcinomas. He had three things biopsied and the right chest wall ended up being melanoma. The breslow depth was 0.5 mm wtih positive peripheral margins and focally positive deep margin. There was no LVI, neurotropism, or satellitosis. TIL were normal. No appreciable mitoses were seen. No ulceration or regression was seen.   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   Path was at [place]. 05/01/2019   Past Surgical History (April Staton, [place]; 06/05/2019 9:28 AM) Anal Fissure Repair   Allergies (April Staton, [place]; 06/05/2019 9:28 AM) No Known Drug Allergies [06/05/2019]:  Medication History (April Staton, CMA; 06/05/2019 9:34 AM) Clobetasol & Clobetasol Emul (0.05 & 0.05% Misc, External) Active. hydroCHLOROthiazide (12.5MG  Tablet, Oral) Active. Multivitamin (Oral) Active. Fish Oil (1000MG  Capsule, Oral) Active. Omeprazole (40MG  Capsule DR, Oral) Active. Pradaxa (150MG  Capsule, Oral) Active. Pravastatin Sodium (40MG  Tablet, Oral) Active. Tamsulosin HCl (0.4MG  Capsule, Oral) Active. predniSONE (20MG  Tablet, Oral) Active. traMADol HCl (50MG  Tablet, Oral) Active. Valsartan (320MG  Tablet, Oral) Active. Medications Reconciled  Social History (April Staton,  CMA; 06/05/2019 9:28 AM) No drug use   Family History (April Staton, [place]; 06/05/2019 9:28 AM) Depression  Sister. Diabetes Mellitus  Brother. Heart Disease  Brother, Father.  Other Problems (April Staton, CMA; 06/05/2019 9:28 AM) Atrial Fibrillation  Back Pain  Hemorrhoids  High blood pressure     Review of Systems Stark Klein MD; 06/05/2019 9:50 AM) General Not Present- Appetite Loss, Chills, Fatigue, Fever, Night Sweats, Weight Gain and Weight Loss. HEENT Not Present- Earache, Hearing Loss, Hoarseness, Nose Bleed, Oral Ulcers, Ringing in the Ears, Seasonal Allergies, Sinus Pain, Sore Throat, Visual Disturbances, Wears glasses/contact lenses and Yellow Eyes. Neurological Not Present- Decreased Memory, Fainting, Headaches, Numbness, Seizures, Tingling, Tremor, Trouble walking and Weakness. Psychiatric Not Present- Anxiety, Bipolar, Change in Sleep Pattern, Depression, Fearful and Frequent crying. Hematology Present- Blood Thinners. Not Present- Easy Bruising, Excessive bleeding, Gland problems, HIV and Persistent Infections. All other systems negative  Vitals (April Staton CMA; 06/05/2019 9:31 AM) 06/05/2019 9:30 AM Weight: 201.13 lb Height: 69in Body Surface Area: 2.07 m Body Mass Index: 29.7 kg/m  Temp.: 97.64F(Oral)  Pulse: 89 (Regular)  P.OX: 96% (Room air) BP: 140/82 (Sitting, Left Arm, Standard)       Physical Exam Stark Klein MD; 06/05/2019 9:51 AM) General Mental Status-Alert. General Appearance-Consistent with stated age. Hydration-Well hydrated. Voice-Normal. Note: looks younger than stated age.   Integumentary Note: reddish scar inferomedial to right nipple. freckles and small other nevi scattered.   Head and Neck Head-normocephalic, atraumatic with no lesions or palpable masses. Trachea-midline. Thyroid Gland Characteristics - normal size and consistency.  Eye Eyeball - Bilateral-Extraocular movements  intact. Sclera/Conjunctiva - Bilateral-No scleral icterus.  Chest and Lung Exam Chest and lung exam reveals -quiet, even and easy respiratory effort  with no use of accessory muscles and on auscultation, normal breath sounds, no adventitious sounds and normal vocal resonance. Inspection Chest Wall - Normal. Back - normal.  Cardiovascular Cardiovascular examination reveals -normal heart sounds, regular rate and rhythm with no murmurs and normal pedal pulses bilaterally.  Abdomen Inspection Inspection of the abdomen reveals - No Hernias. Palpation/Percussion Palpation and Percussion of the abdomen reveal - Soft, Non Tender, No Rebound tenderness, No Rigidity (guarding) and No hepatosplenomegaly. Auscultation Auscultation of the abdomen reveals - Bowel sounds normal.  Neurologic Neurologic evaluation reveals -alert and oriented x 3 with no impairment of recent or remote memory. Mental Status-Normal.  Musculoskeletal Global Assessment -Note: no gross deformities.  Normal Exam - Left-Upper Extremity Strength Normal and Lower Extremity Strength Normal. Normal Exam - Right-Upper Extremity Strength Normal and Lower Extremity Strength Normal.  Lymphatic Head & Neck  General Head & Neck Lymphatics: Bilateral - Description - Normal. Axillary  General Axillary Region: Bilateral - Description - Normal. Tenderness - Non Tender. Femoral & Inguinal  Generalized Femoral & Inguinal Lymphatics: Bilateral - Description - No Generalized lymphadenopathy.    Assessment & Plan Stark Klein MD; 06/05/2019 9:52 AM) MALIGNANT MELANOMA OF SKIN OF CHEST (C43.59) Impression: Will plan WLE with advancement flap closure.  Do not think he needs sentinel node give only focally positive deep margin, no appreciable residual lesion, and decision dx category of 1.  Reviewed risks of surgery including wound infection/dehiscence, dissatisfaction with scar, possible permanent numbness of skin  around incision, heart or lung issues, possible melanoma recurrence, blood clot, and more.  Will contact Dr. Marlou Porch regarding pradaxa  He understands and wishes to proceed. Current Plans You are being scheduled for surgery- Our schedulers will call you.  You should hear from our office's scheduling department within 5 working days about the location, date, and time of surgery. We try to make accommodations for patient's preferences in scheduling surgery, but sometimes the OR schedule or the surgeon's schedule prevents [place] from making those accommodations.  If you have not heard from our office [phone]) in 5 working days, call the office and ask for your surgeon's nurse.  If you have other questions about your diagnosis, plan, or surgery, call the office and ask for your surgeon's nurse.  Pt Education - Melanoma: skin cancer   Signed electronically by Stark Klein, MD (06/05/2019 9:54 AM)

## 2019-06-22 ENCOUNTER — Ambulatory Visit (HOSPITAL_BASED_OUTPATIENT_CLINIC_OR_DEPARTMENT_OTHER): Payer: Medicare Other | Admitting: Anesthesiology

## 2019-06-22 ENCOUNTER — Encounter (HOSPITAL_BASED_OUTPATIENT_CLINIC_OR_DEPARTMENT_OTHER): Payer: Self-pay | Admitting: *Deleted

## 2019-06-22 ENCOUNTER — Other Ambulatory Visit: Payer: Self-pay

## 2019-06-22 ENCOUNTER — Ambulatory Visit (HOSPITAL_BASED_OUTPATIENT_CLINIC_OR_DEPARTMENT_OTHER)
Admission: RE | Admit: 2019-06-22 | Discharge: 2019-06-22 | Disposition: A | Discharge status: Home / Self Care | Payer: Medicare Other | Attending: General Surgery | Admitting: General Surgery

## 2019-06-22 ENCOUNTER — Encounter (HOSPITAL_BASED_OUTPATIENT_CLINIC_OR_DEPARTMENT_OTHER)
Admission: RE | Disposition: A | Discharge status: Home / Self Care | Payer: Self-pay | Source: Home / Self Care | Attending: General Surgery

## 2019-06-22 DIAGNOSIS — M1990 Unspecified osteoarthritis, unspecified site: Secondary | ICD-10-CM | POA: Diagnosis not present

## 2019-06-22 DIAGNOSIS — I2510 Atherosclerotic heart disease of native coronary artery without angina pectoris: Secondary | ICD-10-CM | POA: Diagnosis not present

## 2019-06-22 DIAGNOSIS — Z79899 Other long term (current) drug therapy: Secondary | ICD-10-CM | POA: Insufficient documentation

## 2019-06-22 DIAGNOSIS — Z7901 Long term (current) use of anticoagulants: Secondary | ICD-10-CM | POA: Diagnosis not present

## 2019-06-22 DIAGNOSIS — Z85828 Personal history of other malignant neoplasm of skin: Secondary | ICD-10-CM | POA: Insufficient documentation

## 2019-06-22 DIAGNOSIS — I4891 Unspecified atrial fibrillation: Secondary | ICD-10-CM | POA: Insufficient documentation

## 2019-06-22 DIAGNOSIS — I10 Essential (primary) hypertension: Secondary | ICD-10-CM | POA: Insufficient documentation

## 2019-06-22 DIAGNOSIS — C4359 Malignant melanoma of other part of trunk: Secondary | ICD-10-CM | POA: Insufficient documentation

## 2019-06-22 DIAGNOSIS — K219 Gastro-esophageal reflux disease without esophagitis: Secondary | ICD-10-CM | POA: Insufficient documentation

## 2019-06-22 DIAGNOSIS — G4730 Sleep apnea, unspecified: Secondary | ICD-10-CM | POA: Diagnosis not present

## 2019-06-22 DIAGNOSIS — E785 Hyperlipidemia, unspecified: Secondary | ICD-10-CM | POA: Insufficient documentation

## 2019-06-22 HISTORY — DX: Dorsalgia, unspecified: M54.9

## 2019-06-22 HISTORY — PX: MELANOMA EXCISION: SHX5266

## 2019-06-22 SURGERY — EXCISION, MELANOMA
Anesthesia: General | Site: Chest | Laterality: Right

## 2019-06-22 MED ORDER — PHENYLEPHRINE HCL-NACL 10-0.9 MG/250ML-% IV SOLN
INTRAVENOUS | Status: DC | PRN
  Administered 2019-06-22: 40 ug/min via INTRAVENOUS

## 2019-06-22 MED ORDER — MIDAZOLAM HCL 2 MG/2ML IJ SOLN
INTRAMUSCULAR | Status: AC
  Filled 2019-06-22: qty 2

## 2019-06-22 MED ORDER — BUPIVACAINE HCL (PF) 0.25 % IJ SOLN
INTRAMUSCULAR | Status: DC | PRN
  Administered 2019-06-22: 20 mL

## 2019-06-22 MED ORDER — FENTANYL CITRATE (PF) 100 MCG/2ML IJ SOLN
INTRAMUSCULAR | Status: AC
  Filled 2019-06-22: qty 2

## 2019-06-22 MED ORDER — CEFAZOLIN SODIUM-DEXTROSE 2-4 GM/100ML-% IV SOLN
INTRAVENOUS | Status: AC
  Filled 2019-06-22: qty 100

## 2019-06-22 MED ORDER — LIDOCAINE-EPINEPHRINE (PF) 1 %-1:200000 IJ SOLN
INTRAMUSCULAR | Status: DC | PRN
  Administered 2019-06-22: 20 mL

## 2019-06-22 MED ORDER — OXYCODONE HCL 5 MG PO TABS: 2.5000 mg | ORAL_TABLET | Freq: Four times a day (QID) | ORAL | 0 refills | Status: DC | PRN

## 2019-06-22 MED ORDER — PROPOFOL 10 MG/ML IV BOLUS
INTRAVENOUS | Status: AC
  Filled 2019-06-22: qty 20

## 2019-06-22 MED ORDER — PHENYLEPHRINE HCL (PRESSORS) 10 MG/ML IV SOLN
INTRAVENOUS | Status: DC | PRN
  Administered 2019-06-22 (×2): 80 ug via INTRAVENOUS

## 2019-06-22 MED ORDER — LACTATED RINGERS IV SOLN
INTRAVENOUS | Status: DC
  Administered 2019-06-22: 13:00:00 via INTRAVENOUS

## 2019-06-22 MED ORDER — ACETAMINOPHEN 500 MG PO TABS
ORAL_TABLET | ORAL | Status: AC
  Filled 2019-06-22: qty 2

## 2019-06-22 MED ORDER — LIDOCAINE HCL (CARDIAC) PF 100 MG/5ML IV SOSY
PREFILLED_SYRINGE | INTRAVENOUS | Status: DC | PRN
  Administered 2019-06-22: 40 mg via INTRAVENOUS

## 2019-06-22 MED ORDER — LIDOCAINE 2% (20 MG/ML) 5 ML SYRINGE
INTRAMUSCULAR | Status: DC | PRN
  Administered 2019-06-22: 50 mg via INTRAVENOUS

## 2019-06-22 MED ORDER — ONDANSETRON HCL 4 MG/2ML IJ SOLN: 4.0000 mg | Freq: Once | INTRAMUSCULAR | Status: DC | PRN

## 2019-06-22 MED ORDER — ACETAMINOPHEN 500 MG PO TABS
1000.0000 mg | ORAL_TABLET | ORAL | Status: AC
  Administered 2019-06-22: 1000 mg via ORAL

## 2019-06-22 MED ORDER — CHLORHEXIDINE GLUCONATE CLOTH 2 % EX PADS: 6.0000 | MEDICATED_PAD | Freq: Once | CUTANEOUS | Status: DC

## 2019-06-22 MED ORDER — FENTANYL CITRATE (PF) 100 MCG/2ML IJ SOLN: 25.0000 ug | INTRAMUSCULAR | Status: DC | PRN

## 2019-06-22 MED ORDER — FENTANYL CITRATE (PF) 100 MCG/2ML IJ SOLN
50.0000 ug | INTRAMUSCULAR | Status: DC | PRN
  Administered 2019-06-22: 50 ug via INTRAVENOUS

## 2019-06-22 MED ORDER — CEFAZOLIN SODIUM-DEXTROSE 2-4 GM/100ML-% IV SOLN
2.0000 g | INTRAVENOUS | Status: AC
  Administered 2019-06-22: 2 g via INTRAVENOUS

## 2019-06-22 MED ORDER — MIDAZOLAM HCL 2 MG/2ML IJ SOLN
1.0000 mg | INTRAMUSCULAR | Status: DC | PRN
  Administered 2019-06-22: 14:00:00 1 mg via INTRAVENOUS

## 2019-06-22 MED ORDER — DEXAMETHASONE SODIUM PHOSPHATE 4 MG/ML IJ SOLN
INTRAMUSCULAR | Status: DC | PRN
  Administered 2019-06-22: 4 mg via INTRAVENOUS
  Administered 2019-06-22: 5 mg via INTRAVENOUS

## 2019-06-22 MED ORDER — PROPOFOL 10 MG/ML IV BOLUS
INTRAVENOUS | Status: DC | PRN
  Administered 2019-06-22: 120 mg via INTRAVENOUS

## 2019-06-22 SURGICAL SUPPLY — 60 items
BENZOIN TINCTURE PRP APPL 2/3 (GAUZE/BANDAGES/DRESSINGS) ×3 IMPLANT
BLADE HEX COATED 2.75 (ELECTRODE) ×3 IMPLANT
BLADE SURG 10 STRL SS (BLADE) ×3 IMPLANT
BLADE SURG 15 STRL LF DISP TIS (BLADE) ×1 IMPLANT
BLADE SURG 15 STRL SS (BLADE) ×2
BNDG COHESIVE 4X5 TAN STRL (GAUZE/BANDAGES/DRESSINGS) IMPLANT
CANISTER SUCT 1200ML W/VALVE (MISCELLANEOUS) IMPLANT
CHLORAPREP W/TINT 26 (MISCELLANEOUS) ×3 IMPLANT
CLIP VESOCCLUDE MED 6/CT (CLIP) IMPLANT
CLOSURE WOUND 1/2 X4 (GAUZE/BANDAGES/DRESSINGS) ×1
COVER BACK TABLE REUSABLE LG (DRAPES) IMPLANT
COVER MAYO STAND REUSABLE (DRAPES) IMPLANT
COVER PROBE W GEL 5X96 (DRAPES) IMPLANT
COVER WAND RF STERILE (DRAPES) IMPLANT
DECANTER SPIKE VIAL GLASS SM (MISCELLANEOUS) ×3 IMPLANT
DERMABOND ADVANCED (GAUZE/BANDAGES/DRESSINGS)
DERMABOND ADVANCED .7 DNX12 (GAUZE/BANDAGES/DRESSINGS) IMPLANT
DRAPE IMP U-DRAPE 54X76 (DRAPES) ×3 IMPLANT
DRAPE LAPAROTOMY 100X72 PEDS (DRAPES) IMPLANT
DRAPE UTILITY XL STRL (DRAPES) ×3 IMPLANT
DRSG TEGADERM 4X4.75 (GAUZE/BANDAGES/DRESSINGS) ×12 IMPLANT
ELECT REM PT RETURN 9FT ADLT (ELECTROSURGICAL) ×3
ELECTRODE REM PT RTRN 9FT ADLT (ELECTROSURGICAL) ×1 IMPLANT
GAUZE SPONGE 4X4 12PLY STRL LF (GAUZE/BANDAGES/DRESSINGS) ×3 IMPLANT
GLOVE BIO SURGEON STRL SZ 6 (GLOVE) ×3 IMPLANT
GLOVE BIOGEL PI IND STRL 6.5 (GLOVE) ×1 IMPLANT
GLOVE BIOGEL PI INDICATOR 6.5 (GLOVE) ×2
GOWN STRL REUS W/ TWL LRG LVL3 (GOWN DISPOSABLE) ×1 IMPLANT
GOWN STRL REUS W/TWL 2XL LVL3 (GOWN DISPOSABLE) ×3 IMPLANT
GOWN STRL REUS W/TWL LRG LVL3 (GOWN DISPOSABLE) ×2
MICROMATRIX 500MG (Tissue) ×3 IMPLANT
NDL SAFETY ECLIPSE 18X1.5 (NEEDLE) IMPLANT
NEEDLE HYPO 18GX1.5 SHARP (NEEDLE)
NEEDLE HYPO 25X1 1.5 SAFETY (NEEDLE) ×3 IMPLANT
NS IRRIG 1000ML POUR BTL (IV SOLUTION) ×3 IMPLANT
PACK BASIN DAY SURGERY FS (CUSTOM PROCEDURE TRAY) ×3 IMPLANT
PACK UNIVERSAL I (CUSTOM PROCEDURE TRAY) ×3 IMPLANT
PENCIL SMOKE EVACUATOR (MISCELLANEOUS) ×3 IMPLANT
SLEEVE SCD COMPRESS KNEE MED (MISCELLANEOUS) ×3 IMPLANT
SOLUTION PARTIC MCRMTRX 500MG (Tissue) ×1 IMPLANT
SPONGE LAP 18X18 RF (DISPOSABLE) ×3 IMPLANT
STAPLER VISISTAT 35W (STAPLE) IMPLANT
STOCKINETTE IMPERVIOUS LG (DRAPES) IMPLANT
STRIP CLOSURE SKIN 1/2X4 (GAUZE/BANDAGES/DRESSINGS) ×2 IMPLANT
SUT ETHILON 2 0 FS 18 (SUTURE) IMPLANT
SUT MNCRL AB 4-0 PS2 18 (SUTURE) ×3 IMPLANT
SUT SILK 2 0 SH (SUTURE) ×3 IMPLANT
SUT SILK 3 0 TIES 17X18 (SUTURE)
SUT SILK 3-0 18XBRD TIE BLK (SUTURE) IMPLANT
SUT VIC AB 2-0 SH 27 (SUTURE) ×4
SUT VIC AB 2-0 SH 27XBRD (SUTURE) ×2 IMPLANT
SUT VIC AB 3-0 SH 27 (SUTURE) ×4
SUT VIC AB 3-0 SH 27X BRD (SUTURE) ×2 IMPLANT
SUT VICRYL 4-0 PS2 18IN ABS (SUTURE) ×3 IMPLANT
SYR CONTROL 10ML LL (SYRINGE) ×3 IMPLANT
SYR TB 1ML LL NO SAFETY (SYRINGE) ×3 IMPLANT
TOWEL GREEN STERILE FF (TOWEL DISPOSABLE) ×3 IMPLANT
TUBE CONNECTING 20'X1/4 (TUBING)
TUBE CONNECTING 20X1/4 (TUBING) IMPLANT
YANKAUER SUCT BULB TIP NO VENT (SUCTIONS) IMPLANT

## 2019-06-22 NOTE — Anesthesia Preprocedure Evaluation (Signed)
Anesthesia Evaluation  Patient identified by MRN, date of birth, ID band Patient awake    Reviewed: Allergy & Precautions, NPO status , Patient's Chart, lab work & pertinent test results  Airway Mallampati: II  TM Distance: >3 FB Neck ROM: Full    Dental no notable dental hx. (+) Caps   Pulmonary sleep apnea and Continuous Positive Airway Pressure Ventilation ,    Pulmonary exam normal breath sounds clear to auscultation       Cardiovascular hypertension, Pt. on medications + CAD  + dysrhythmias Atrial Fibrillation  Rhythm:Regular Rate:Normal  EKG 03/24/2019 NSR, RBBB  Echo 11/05/2017 Left ventricle: The cavity size was normal. There was mild focal basal hypertrophy of the septum. Systolic function was normal. The estimated ejection fraction was in the range of 50% to 55%. Wall motion was normal; there were no regional wall motion  abnormalities. Features are consistent with a pseudonormal left  ventricular filling pattern, with concomitant abnormal relaxation and increased filling pressure (grade 2 diastolic dysfunction). - Aortic valve: Trileaflet; moderately thickened, mildly calcified  leaflets. There was mild regurgitation. Regurgitation pressure half-time: 701 ms. - Aorta: Aortic root dimension: 38 mm (ED). - Aortic root: The aortic root was mildly dilated. - Mitral valve: Calcified annulus. There was trivial regurgitation. - Left atrium: The atrium was moderately dilated. - Right ventricle: The cavity size was mildly dilated. Wall   thickness was normal. - Atrial septum: There was increased thickness of the septum, consistent with lipomatous hypertrophy. - Tricuspid valve: There was trivial regurgitation. - Pulmonic valve: There was trivial regurgitation.    Neuro/Psych negative neurological ROS  negative psych ROS   GI/Hepatic Neg liver ROS, GERD  Medicated and Controlled,  Endo/Other  HLD  Renal/GU Renal  diseaseHx/o renal calculi   Hx/o prostate Ca negative genitourinary   Musculoskeletal  (+) Arthritis , Osteoarthritis,    Abdominal (+) - obese,   Peds  Hematology Pradaxa therapy- last dose 8/11   Anesthesia Other Findings Melanoma right chest wall  Reproductive/Obstetrics negative OB ROS                             Anesthesia Physical  Anesthesia Plan  ASA: III  Anesthesia Plan: General   Post-op Pain Management:    Induction: Intravenous  PONV Risk Score and Plan: 1 and Ondansetron, Treatment may vary due to age or medical condition and Propofol infusion  Airway Management Planned: Natural Airway  Additional Equipment: None  Intra-op Plan:   Post-operative Plan: Extubation in OR  Informed Consent: I have reviewed the patients History and Physical, chart, labs and discussed the procedure including the risks, benefits and alternatives for the proposed anesthesia with the patient or authorized representative who has indicated his/her understanding and acceptance.     Dental advisory given  Plan Discussed with: CRNA and Surgeon  Anesthesia Plan Comments:         Anesthesia Quick Evaluation

## 2019-06-22 NOTE — Op Note (Signed)
PRE-OPERATIVE DIAGNOSIS: cT1aN0 right chest wall melanoma  POST-OPERATIVE DIAGNOSIS:  Same  PROCEDURE:  Procedure(s): Wide local excision 1 cm margins, advancement flap closure for defect 4.1 x 9.1 cm SURGEON:  Surgeon(s): Stark Klein, MD  ANESTHESIA:   local and general  DRAINS: none   LOCAL MEDICATIONS USED:  MARCAINE    and XYLOCAINE   SPECIMEN:  Source of Specimen:  wide local excision right chest wall melanoma   FINDINGS:  No gross residual disease  DISPOSITION OF SPECIMEN:  PATHOLOGY  COUNTS:  YES  PLAN OF CARE: Discharge to home after PACU  PATIENT DISPOSITION:  PACU - hemodynamically stable.    PROCEDURE:   Pt was identified in the holding area, taken to the OR, and placed supine on the OR table.  General anesthesia was induced.  Time out was performed according to the surgical safety checklist.  When all was correct, we continued.      The patient was placed into the supine position.  The right chest wall was prepped and draped in sterile fashion.  The melanoma was identified and 1 cm margins were marked out.  This was turned into an ellipse.  Local was administered under the melanoma and the adjacent tissue.  A #10 blade was used to incise the skin around the melanoma.  The cautery was used to take the dissection down to the fascia.  The skin was marked in situ with orientation sutures.  The cautery was used to take the specimen off the fascia, and it was passed off the table.    Skin hooks were used to elevate the edges of the incision and the skin was freed up in all directions to create advancement flaps.  This was pulled together in a transverse orientation. The skin was pulled together to check the tension. . Deep interrupted 2-0 vicryl sutures were placed to relieve tension.  The skin was then reapproximated with 3-0 interrupted vicryl deep dermal sutures and 4-0 monocryl running subcuticular sutures.  Three 2-0 nylon horizontal mattress sutures were placed as well.     The melanoma site was cleaned, dried, and dressed with Benzoin, steristrips, gauze, and tegaderm.    Needle, sponge, and instrument counts were correct.  The patient was awakened from anesthesia and taken to the PACU in stable condition.

## 2019-06-22 NOTE — Discharge Instructions (Addendum)
[place] Office Phone Number [phone]   POST OP INSTRUCTIONS  Always review your discharge instruction sheet given to you by the facility where your surgery was performed.  IF YOU HAVE DISABILITY OR FAMILY LEAVE FORMS, YOU MUST BRING THEM TO THE OFFICE FOR PROCESSING.  DO NOT GIVE THEM TO YOUR DOCTOR.  1. A prescription for pain medication may be given to you upon discharge.  Take your pain medication as prescribed, if needed.  If narcotic pain medicine is not needed, then you may take acetaminophen (Tylenol) or ibuprofen (Advil) as needed. 2. Take your usually prescribed medications unless otherwise directed 3. If you need a refill on your pain medication, please contact your pharmacy.  They will contact our office to request authorization.  Prescriptions will not be filled after 5pm or on week-ends. 4. You should eat very light the first 24 hours after surgery, such as soup, crackers, pudding, etc.  Resume your normal diet the day after surgery 5. It is common to experience some constipation if taking pain medication after surgery.  Increasing fluid intake and taking a stool softener will usually help or prevent this problem from occurring.  A mild laxative (Milk of Magnesia or Miralax) should be taken according to package directions if there are no bowel movements after 48 hours. 6. You may shower in 48 hours.  The surgical glue will flake off in 2-3 weeks.   7. ACTIVITIES:  No strenuous activity or heavy lifting for 1 week.   a. You may drive when you no longer are taking prescription pain medication, you can comfortably wear a seatbelt, and you can safely maneuver your car and apply brakes. b. RETURN TO WORK:  __________n/a_______________ Dennis Bast should see your doctor in the office for a follow-up appointment approximately three-four weeks after your surgery.    WHEN TO CALL YOUR DOCTOR: 1. Fever over 101.0 2. Nausea and/or vomiting. 3. Extreme swelling or  bruising. 4. Continued bleeding from incision. 5. Increased pain, redness, or drainage from the incision.  The clinic staff is available to answer your questions during regular business hours.  Please dont hesitate to call and ask to speak to one of the nurses for clinical concerns.  If you have a medical emergency, go to the nearest emergency room or call 911.  A surgeon from [facility] is always on call at the hospital.  For further questions, please visit [email or web address]     No Tylenol until 6:30 PM on 06/22/2019.    Post Anesthesia Home Care Instructions  Activity: Get plenty of rest for the remainder of the day. A responsible individual must stay with you for 24 hours following the procedure.  For the next 24 hours, DO NOT: -Drive a car -Paediatric nurse -Drink alcoholic beverages -Take any medication unless instructed by your physician -Make any legal decisions or sign important papers.  Meals: Start with liquid foods such as gelatin or soup. Progress to regular foods as tolerated. Avoid greasy, spicy, heavy foods. If nausea and/or vomiting occur, drink only clear liquids until the nausea and/or vomiting subsides. Call your physician if vomiting continues.  Special Instructions/Symptoms: Your throat may feel dry or sore from the anesthesia or the breathing tube placed in your throat during surgery. If this causes discomfort, gargle with warm salt water. The discomfort should disappear within 24 hours.  If you had a scopolamine patch placed behind your ear for the management of post- operative nausea and/or vomiting:  1. The medication in the  patch is effective for 72 hours, after which it should be removed.  Wrap patch in a tissue and discard in the trash. Wash hands thoroughly with soap and water. 2. You may remove the patch earlier than 72 hours if you experience unpleasant side effects which may include dry mouth, dizziness or visual  disturbances. 3. Avoid touching the patch. Wash your hands with soap and water after contact with the patch.

## 2019-06-22 NOTE — Interval H&P Note (Signed)
History and Physical Interval Note:  06/22/2019 1:24 PM  Antonio Clark  has presented today for surgery, with the diagnosis of MELANOMA RIGHT CHEST WALL.  The various methods of treatment have been discussed with the patient and family. After consideration of risks, benefits and other options for treatment, the patient has consented to  Procedure(s): WIDE LOCAL EXCISION RIGHT CHEST WALL MELANOMA, ADVANCEMENT FLAP CLOSURE FOR DEFECT 3X6 CM (Right) as a surgical intervention.  The patient's history has been reviewed, patient examined, no change in status, stable for surgery.  I have reviewed the patient's chart and labs.  Questions were answered to the patient's satisfaction.     Stark Klein

## 2019-06-22 NOTE — Anesthesia Postprocedure Evaluation (Signed)
Anesthesia Post Note  Patient: Antonio Clark  Procedure(s) Performed: WIDE LOCAL EXCISION RIGHT CHEST WALL MELANOMA, ADVANCEMENT FLAP CLOSURE FOR DEFECT 3X6 CM (Right Chest)     Patient location during evaluation: PACU Anesthesia Type: General Level of consciousness: awake and alert Pain management: pain level controlled Vital Signs Assessment: post-procedure vital signs reviewed and stable Respiratory status: spontaneous breathing, nonlabored ventilation, respiratory function stable and patient connected to nasal cannula oxygen Cardiovascular status: blood pressure returned to baseline and stable Postop Assessment: no apparent nausea or vomiting Anesthetic complications: no    Last Vitals:  Vitals:   06/22/19 1552 06/22/19 1600  BP: (!) 101/51 (!) 113/53  Pulse: 73 71  Resp: 18 17  Temp:    SpO2: 97% 99%    Last Pain:  Vitals:   06/22/19 1600  TempSrc:   PainSc: 0-No pain                 Pervis Hocking

## 2019-06-22 NOTE — Anesthesia Procedure Notes (Signed)
Procedure Name: LMA Insertion Date/Time: 06/22/2019 1:54 PM Performed by: Willa Frater, CRNA Pre-anesthesia Checklist: Patient identified, Emergency Drugs available, Suction available and Patient being monitored Patient Re-evaluated:Patient Re-evaluated prior to induction Oxygen Delivery Method: Circle system utilized Preoxygenation: Pre-oxygenation with 100% oxygen Induction Type: IV induction Ventilation: Mask ventilation without difficulty LMA: LMA inserted LMA Size: 4.0 Number of attempts: 1 Airway Equipment and Method: Bite block Placement Confirmation: positive ETCO2 Tube secured with: Tape Dental Injury: Teeth and Oropharynx as per pre-operative assessment

## 2019-06-22 NOTE — Progress Notes (Signed)
CARDIOLOGY OFFICE NOTE  Date:  06/27/2019    Antonio Clark Date of Birth: 1936-05-11 Medical Record [id number]  PCP:  Susy Frizzle, MD  Cardiologist:  Marisa Cyphers  Chief Complaint  Patient presents with  . Follow-up  . Hypertension    History of Present Illness: Antonio Clark is a 83 y.o. male who presents today for a 6 month check.  Seen for Dr. Marlou Porch. Formerly seen by Dr. Webb Silversmith well as Dr. Susa Simmonds.   I previously took care of both his brother and his sister in law(Bill and Faustino Ferm).  He has a history of HTN and HLD. Strong FH for CAD. Remote Cardiolite in 2013 was normal.   Seen back in May of 2019 by Dr. Marlou Porch - had had AF while in the Urology office. Found to have OSA - but CPAP made him claustrophobic. Placed on Eliquis- changed to Pradaxa due to insurance coverage.   Last seen here in the office back in November - he was doing well. We did a telehealth visit back in May - was more limited by his back from an earlier injury. [facility]. BP was up last month and we increased his HCTZ for him.   The patient does not have symptoms concerning for COVID-19 infection (fever, chills, cough, or new shortness of breath).   Comes in today. Here alone. He feels pretty good. Obviously getting too much salt - likes "wings to go", [employer], etc and loves salt. He had a melanoma removed from his chest last week - this went ok for him. No palpitations, no AF that he is aware of. He is back exercising - walks 3 miles a day.   Past Medical History:  Diagnosis Date  . Arthritis   . Atrial fibrillation ([place])   . Back pain   . Borderline diabetic   . Dysrhythmia    a-fib  . GERD (gastroesophageal reflux disease)   . History of echocardiogram 07/ 07/ 2011  . History of lithotripsy 1989  . Hyperlipidemia   . Hypertension   . Pre-diabetes   . Prostate CA ([place]) 2010  . Sleep apnea    uses CPAP nightly    Past Surgical  History:  Procedure Laterality Date  . COLONOSCOPY  2002  . FASCIECTOMY Left 03/28/2019   Procedure: SEGMENTAL FASCIECTOMY LEFT RING FINGER;  Surgeon: Daryll Brod, MD;  Location: [facility];  Service: Orthopedics;  Laterality: Left;  ANESTHESIA  AXILLARY BLOCK  . INSERTION PROSTATE RADIATION SEED  8 4 2010   per Dr Rosana Hoes  . MELANOMA EXCISION Right 06/22/2019   Procedure: WIDE LOCAL EXCISION RIGHT CHEST WALL MELANOMA, ADVANCEMENT FLAP CLOSURE FOR DEFECT 3X6 CM;  Surgeon: Stark Klein, MD;  Location: [facility];  Service: General;  Laterality: Right;  . POLYPECTOMY  2002  . THROAT SURGERY  1980   benign cyst  . URETHRAL STRICTURE DILATATION     also penile implant     Medications: Current Meds  Medication Sig  . clobetasol ointment (TEMOVATE) [id number] % Apply 1 application topically as needed (skin irritation).   . hydrochlorothiazide (HYDRODIURIL) 25 MG tablet Take 1 tablet (25 mg total) by mouth daily.  . multivitamin (THERAGRAN) per tablet Take 1 tablet by mouth daily.    . Omega-3 Fatty Acids (FISH OIL) 1200 MG CAPS Take 1,200 mg by mouth daily.    Marland Kitchen omeprazole (PRILOSEC) 20 MG capsule Take 20 mg by  mouth as needed (heartburn).  Marland Kitchen oxyCODONE (OXY IR/ROXICODONE) 5 MG immediate release tablet Take 0.5-1 tablets (2.5-5 mg total) by mouth every 6 (six) hours as needed for severe pain.  Marland Kitchen PRADAXA 150 MG CAPS capsule TAKE 1 CAPSULE BY MOUTH TWICE A DAY  . pravastatin (PRAVACHOL) 40 MG tablet TAKE 1 TABLET BY MOUTH EVERY DAY IN THE EVENING  . Tamsulosin HCl (FLOMAX) 0.4 MG CAPS Take 0.4 mg by mouth daily.    . traMADol (ULTRAM) 50 MG tablet Take 1 tablet (50 mg total) by mouth every 6 (six) hours as needed.  . valsartan (DIOVAN) 320 MG tablet Take 1 tablet (320 mg total) by mouth daily.     Allergies: No Known Allergies  Social History: The patient  reports that he has never smoked. He has never used smokeless tobacco. He reports current alcohol use. He  reports that he does not use drugs.   Family History: The patient's family history includes Arrhythmia in his brother; Cancer in his mother; Colon cancer (age of onset: 76) in his mother; Coronary artery disease in his brother; Diabetes in his brother and brother; Hearing loss in his brother; Heart disease in his brother and father; Heart failure in his brother; Hyperlipidemia in his sister; Hypertension in his brother, brother, brother, and father; Stroke in his mother and sister; Sudden death in his father; Suicidality in his sister.   Review of Systems: Please see the history of present illness.   All other systems are reviewed and negative.   Physical Exam: VS:  BP (!) 148/84   Pulse 94   Ht 5\' 9"  (1.753 m)   Wt 197 lb (89.4 kg)   SpO2 95%   BMI 29.09 kg/m  .  BMI Body mass index is 29.09 kg/m.  Wt Readings from Last 3 Encounters:  06/27/19 197 lb (89.4 kg)  06/22/19 194 lb 10.7 oz (88.3 kg)  03/28/19 194 lb 14.2 oz (88.4 kg)    General: Pleasant. Well developed, well nourished and in no acute distress.   HEENT: Normal.  Neck: Supple, no JVD, carotid bruits, or masses noted.  Cardiac: Regular rate and rhythm. No murmurs, rubs, or gallops. No edema.  Respiratory:  Lungs are clear to auscultation bilaterally with normal work of breathing.  GI: Soft and nontender.  MS: No deformity or atrophy. Gait and ROM intact.  Skin: Warm and dry. Color is normal.  Neuro:  Strength and sensation are intact and no gross focal deficits noted.  Psych: Alert, appropriate and with normal affect.   LABORATORY DATA:  EKG:  EKG is not ordered today.   Lab Results  Component Value Date   WBC 5.2 06/19/2019   HGB 14.0 06/19/2019   HCT 41.3 06/19/2019   PLT 146 (L) 06/19/2019   GLUCOSE 168 (H) 06/19/2019   CHOL 141 02/09/2019   TRIG 179 (H) 02/09/2019   HDL 42 02/09/2019   LDLCALC 73 02/09/2019   ALT 20 06/19/2019   AST 22 06/19/2019   NA 138 06/19/2019   K 4.6 06/19/2019   CL 105  06/19/2019   CREATININE 1.16 06/19/2019   BUN 18 06/19/2019   CO2 22 06/19/2019   INR 1.0 03/06/2009   HGBA1C 6.6 (H) 02/09/2019     BNP (last 3 results) No results for input(s): BNP in the last 8760 hours.  ProBNP (last 3 results) No results for input(s): PROBNP in the last 8760 hours.   Other Studies Reviewed Today:  11/05/2017-Echocardiogram - Left ventricle: The cavity  size was normal. There was mild focal basal hypertrophy of the septum. Systolic function was normal. The estimated ejection fraction was in the range of 50% to 55%. Wall motion was normal; there were no regional wall motion abnormalities. Features are consistent with a pseudonormal left ventricular filling pattern, with concomitant abnormal relaxation and increased filling pressure (grade 2 diastolic dysfunction). - Aortic valve: Trileaflet; moderately thickened, mildly calcified leaflets. There was mild regurgitation. Regurgitation pressure half-time: 701 ms. - Aorta: Aortic root dimension: 38 mm (ED). - Aortic root: The aortic root was mildly dilated. - Mitral valve: Calcified annulus. There was trivial regurgitation. - Left atrium: The atrium was moderately dilated. - Right ventricle: The cavity size was mildly dilated. Wall thickness was normal. - Atrial septum: There was increased thickness of the septum, consistent with lipomatous hypertrophy. - Tricuspid valve: There was trivial regurgitation. - Pulmonic valve: There was trivial regurgitation.    ASSESSMENT & PLAN:    1. PAF -remains in NSR per exam. Remains on anticoagulation.   2. HTN - BP is better- he needs to work on less salt - will give him a couple of months and then see him back. His cuff is about 10 points lower than ours. He knows what he needs to be doing.   3. FH +CAD - continue with risk factor modification.   4. OSA - on CPAP  5. Chronic anticoagulation- no problems noted.  Labs noted.   6.  COVID-19 Education: The signs and symptoms of COVID-19 were discussed with the patient and how to seek care for testing (follow up with PCP or arrange E-visit).  The importance of social distancing, staying at home, hand hygiene and wearing a mask when out in public were discussed today.  Current medicines are reviewed with the patient today.  The patient does not have concerns regarding medicines other than what has been noted above.  The following changes have been made:  See above.  Labs/ tests ordered today include:   No orders of the defined types were placed in this encounter.    Disposition:   FU with me in about 3 to 4 months.   Patient is agreeable to this plan and will call if any problems develop in the interim.   SignedTruitt Merle, NP  06/27/2019 3:27 PM  [facility] [address] [place], [place]  [address] Phone: [phone] Fax: [phone]

## 2019-06-22 NOTE — Transfer of Care (Signed)
Immediate Anesthesia Transfer of Care Note  Patient: Antonio Clark  Procedure(s) Performed: WIDE LOCAL EXCISION RIGHT CHEST WALL MELANOMA, ADVANCEMENT FLAP CLOSURE FOR DEFECT 3X6 CM (Right Chest)  Patient Location: PACU  Anesthesia Type:General  Level of Consciousness: awake, alert  and oriented  Airway & Oxygen Therapy: Patient Spontanous Breathing and Patient connected to face mask oxygen  Post-op Assessment: Report given to RN and Post -op Vital signs reviewed and stable  Post vital signs: Reviewed and stable  Last Vitals:  Vitals Value Taken Time  BP 122/50 06/22/19 1503  Temp    Pulse 77 06/22/19 1506  Resp 19 06/22/19 1506  SpO2 98 % 06/22/19 1506  Vitals shown include unvalidated device data.  Last Pain:  Vitals:   06/22/19 1222  TempSrc: Oral  PainSc: 0-No pain         Complications: No apparent anesthesia complications

## 2019-06-26 ENCOUNTER — Encounter (HOSPITAL_BASED_OUTPATIENT_CLINIC_OR_DEPARTMENT_OTHER): Payer: Self-pay | Admitting: General Surgery

## 2019-06-27 ENCOUNTER — Other Ambulatory Visit: Payer: Self-pay

## 2019-06-27 ENCOUNTER — Ambulatory Visit (INDEPENDENT_AMBULATORY_CARE_PROVIDER_SITE_OTHER): Payer: Medicare Other | Admitting: Nurse Practitioner

## 2019-06-27 ENCOUNTER — Encounter: Payer: Self-pay | Admitting: Nurse Practitioner

## 2019-06-27 VITALS — BP 148/84 | HR 94 | Ht 69.0 in | Wt 197.0 lb

## 2019-06-27 DIAGNOSIS — Z7189 Other specified counseling: Secondary | ICD-10-CM | POA: Diagnosis not present

## 2019-06-27 DIAGNOSIS — G4733 Obstructive sleep apnea (adult) (pediatric): Secondary | ICD-10-CM

## 2019-06-27 DIAGNOSIS — I10 Essential (primary) hypertension: Secondary | ICD-10-CM | POA: Diagnosis not present

## 2019-06-27 DIAGNOSIS — I480 Paroxysmal atrial fibrillation: Secondary | ICD-10-CM

## 2019-06-27 DIAGNOSIS — E7800 Pure hypercholesterolemia, unspecified: Secondary | ICD-10-CM

## 2019-06-27 LAB — SURGICAL PATHOLOGY

## 2019-06-27 NOTE — Patient Instructions (Addendum)
After Visit Summary:  We will be checking the following labs today - NONE   Medication Instructions:    Continue with your current medicines.    If you need a refill on your cardiac medications before your next appointment, please call your pharmacy.     Testing/Procedures To Be Arranged:  N/A  Follow-Up:   See me in 3 months    At [facility], you and your health needs are our priority.  As part of our continuing mission to provide you with exceptional heart care, we have created designated Provider Care Teams.  These Care Teams include your primary Cardiologist (physician) and Advanced Practice Providers (APPs -  Physician Assistants and Nurse Practitioners) who all work together to provide you with the care you need, when you need it.  Special Instructions:  . Stay safe, stay home, wash your hands for at least 20 seconds and wear a mask when out in public.  . It was good to talk with you today.  . Less salt, less salt, less salt.  Marland Kitchen Keep a check on your BP for me.    Call the [place] office at [phone] if you have any questions, problems or concerns.

## 2019-06-28 NOTE — Progress Notes (Signed)
Please let patient know margins are negative!

## 2019-07-19 DIAGNOSIS — H35373 Puckering of macula, bilateral: Secondary | ICD-10-CM | POA: Diagnosis not present

## 2019-07-24 ENCOUNTER — Other Ambulatory Visit: Payer: Self-pay | Admitting: General Surgery

## 2019-07-24 DIAGNOSIS — C4359 Malignant melanoma of other part of trunk: Secondary | ICD-10-CM

## 2019-07-25 ENCOUNTER — Other Ambulatory Visit: Payer: Self-pay | Admitting: General Surgery

## 2019-07-25 ENCOUNTER — Other Ambulatory Visit (HOSPITAL_COMMUNITY): Payer: Self-pay | Admitting: General Surgery

## 2019-07-25 DIAGNOSIS — C4359 Malignant melanoma of other part of trunk: Secondary | ICD-10-CM

## 2019-07-26 ENCOUNTER — Other Ambulatory Visit: Payer: Self-pay

## 2019-07-26 ENCOUNTER — Ambulatory Visit
Admission: RE | Admit: 2019-07-26 | Discharge: 2019-07-26 | Disposition: A | Discharge status: Home / Self Care | Payer: Medicare Other | Source: Ambulatory Visit | Attending: General Surgery | Admitting: General Surgery

## 2019-07-26 ENCOUNTER — Other Ambulatory Visit: Payer: Self-pay | Admitting: Cardiology

## 2019-07-26 DIAGNOSIS — C4359 Malignant melanoma of other part of trunk: Secondary | ICD-10-CM

## 2019-07-26 MED ORDER — IOPAMIDOL (ISOVUE-300) INJECTION 61%
75.0000 mL | Freq: Once | INTRAVENOUS | Status: AC | PRN
  Administered 2019-07-26: 75 mL via INTRAVENOUS

## 2019-07-26 NOTE — Telephone Encounter (Addendum)
Pt last saw Truitt Merle, NP on 06/27/19, last labs 06/19/19 Creat 1.16, age 83, weight 89.4kg, CrCl 63.15, based on CrCl pt is on appropriate dosage of Pradaxa 150mg  BID.  Will refill rx.

## 2019-07-27 ENCOUNTER — Encounter: Payer: Self-pay | Admitting: Family Medicine

## 2019-07-27 DIAGNOSIS — C439 Malignant melanoma of skin, unspecified: Secondary | ICD-10-CM | POA: Insufficient documentation

## 2019-08-01 ENCOUNTER — Other Ambulatory Visit: Payer: Self-pay | Admitting: Cardiology

## 2019-08-14 ENCOUNTER — Other Ambulatory Visit: Payer: Medicare Other

## 2019-08-14 ENCOUNTER — Other Ambulatory Visit: Payer: Self-pay

## 2019-08-14 DIAGNOSIS — E785 Hyperlipidemia, unspecified: Secondary | ICD-10-CM

## 2019-08-14 DIAGNOSIS — R7303 Prediabetes: Secondary | ICD-10-CM

## 2019-08-14 DIAGNOSIS — I10 Essential (primary) hypertension: Secondary | ICD-10-CM

## 2019-08-15 LAB — CBC WITH DIFFERENTIAL/PLATELET
Absolute Monocytes: 524 cells/uL (ref 200–950)
Basophils Absolute: 38 cells/uL (ref 0–200)
Basophils Relative: 0.7 %
Eosinophils Absolute: 232 cells/uL (ref 15–500)
Eosinophils Relative: 4.3 %
HCT: 40.6 % (ref 38.5–50.0)
Hemoglobin: 13.6 g/dL (ref 13.2–17.1)
Lymphs Abs: 1766 cells/uL (ref 850–3900)
MCH: 29.4 pg (ref 27.0–33.0)
MCHC: 33.5 g/dL (ref 32.0–36.0)
MCV: 87.9 fL (ref 80.0–100.0)
MPV: 9.9 fL (ref 7.5–12.5)
Monocytes Relative: 9.7 %
Neutro Abs: 2840 cells/uL (ref 1500–7800)
Neutrophils Relative %: 52.6 %
Platelets: 140 10*3/uL (ref 140–400)
RBC: 4.62 10*6/uL (ref 4.20–5.80)
RDW: 12.5 % (ref 11.0–15.0)
Total Lymphocyte: 32.7 %
WBC: 5.4 10*3/uL (ref 3.8–10.8)

## 2019-08-15 LAB — COMPREHENSIVE METABOLIC PANEL
AG Ratio: 1.9 (calc) (ref 1.0–2.5)
ALT: 11 U/L (ref 9–46)
AST: 15 U/L (ref 10–35)
Albumin: 4.3 g/dL (ref 3.6–5.1)
Alkaline phosphatase (APISO): 47 U/L (ref 35–144)
BUN: 23 mg/dL (ref 7–25)
CO2: 25 mmol/L (ref 20–32)
Calcium: 9.4 mg/dL (ref 8.6–10.3)
Chloride: 105 mmol/L (ref 98–110)
Creat: 1.02 mg/dL (ref 0.70–1.11)
Globulin: 2.3 g/dL (calc) (ref 1.9–3.7)
Glucose, Bld: 180 mg/dL — ABNORMAL HIGH (ref 65–99)
Potassium: 4.3 mmol/L (ref 3.5–5.3)
Sodium: 139 mmol/L (ref 135–146)
Total Bilirubin: 0.4 mg/dL (ref 0.2–1.2)
Total Protein: 6.6 g/dL (ref 6.1–8.1)

## 2019-08-15 LAB — LIPID PANEL
Cholesterol: 167 mg/dL (ref <200)
HDL: 45 mg/dL (ref >=40)
LDL Cholesterol (Calc): 94 mg/dL (calc)
Non-HDL Cholesterol (Calc): 122 mg/dL (calc) (ref <130)
Total CHOL/HDL Ratio: 3.7 (calc) (ref <5.0)
Triglycerides: 184 mg/dL — ABNORMAL HIGH (ref <150)

## 2019-08-15 LAB — HEMOGLOBIN A1C
Hgb A1c MFr Bld: 7 % of total Hgb — ABNORMAL HIGH (ref <5.7)
Mean Plasma Glucose: 154 (calc)
eAG (mmol/L): 8.5 (calc)

## 2019-08-16 ENCOUNTER — Telehealth: Payer: Self-pay | Admitting: Cardiology

## 2019-08-16 ENCOUNTER — Other Ambulatory Visit: Payer: Self-pay

## 2019-08-16 ENCOUNTER — Encounter (HOSPITAL_BASED_OUTPATIENT_CLINIC_OR_DEPARTMENT_OTHER): Payer: Self-pay | Admitting: General Surgery

## 2019-08-16 NOTE — Telephone Encounter (Signed)
Pt takes Pradaxa for afib with CHADS2VASc score of 6 (age x2, CHF, HTN, DM, CAD). SCr 1.16, CrCl 73mL/min. Recommend holding Pradaxa for 3 days prior to biopsy.

## 2019-08-16 NOTE — Telephone Encounter (Signed)
Antonio Clark from [facility] just called for surgical clearance on this patient for a lymph node biopsy. She was wondering if she could get the surgical clearance for his August procedure from [facility] faxed to them as well. Please advise.    Fax number: [phone]

## 2019-08-16 NOTE — Telephone Encounter (Signed)
   Primary Cardiologist: Candee Furbish, MD  Chart reviewed as part of pre-operative protocol coverage. Patient was contacted 08/16/2019 in reference to pre-operative risk assessment for pending surgery as outlined below.  Antonio Clark was last seen on 06/27/2019 by Truitt Merle, NP.  Since that day, Antonio Clark has done well from a cardiac standpoint. He has had several procedures in the past year without complications. He continues to be quite active, walking 2-3 miles a day. He can easily complete 4 METs without anginal complaints.  Therefore, based on ACC/AHA guidelines, the patient would be at acceptable risk for the planned procedure without further cardiovascular testing.   Per pharmacy recommendations, patient can hold pradaxa 3 days prior to his upcoming biopsy and should restart pradaxa when cleared to do so by Dr. Barry Dienes.  I will route this recommendation to the requesting party via Epic fax function and remove from pre-op pool.  Please call with questions.  Abigail Butts, PA-C 08/16/2019, 5:07 PM

## 2019-08-16 NOTE — Telephone Encounter (Signed)
   [facility] Pre-operative Risk Assessment    Request for surgical clearance:  1. What type of surgery is being performed? Lymph node biopsy   2. When is this surgery scheduled? 08/23/19  3. What type of clearance is required (medical clearance vs. Pharmacy clearance to hold med vs. Both)? Cardiac Clearance  4. Are there any medications that need to be held prior to surgery and how long? Pradaxa  5. Practice name and name of physician performing surgery? Dr. Haskel Schroeder [facility]  6. What is your office phone number [phone]   7.   What is your office fax number [phone]  8.   Anesthesia type (None, local, MAC, general) ? General   Antonio Clark 08/16/2019, 3:05 PM  _________________________________________________________________   (provider comments below)

## 2019-08-17 ENCOUNTER — Encounter: Payer: Self-pay | Admitting: Family Medicine

## 2019-08-17 ENCOUNTER — Ambulatory Visit
Admission: RE | Admit: 2019-08-17 | Discharge: 2019-08-17 | Disposition: A | Discharge status: Home / Self Care | Payer: Medicare Other | Source: Ambulatory Visit | Attending: Family Medicine | Admitting: Family Medicine

## 2019-08-17 ENCOUNTER — Ambulatory Visit (INDEPENDENT_AMBULATORY_CARE_PROVIDER_SITE_OTHER): Payer: Medicare Other | Admitting: Family Medicine

## 2019-08-17 VITALS — BP 152/80 | HR 72 | Temp 96.4°F | Resp 14 | Ht 69.0 in | Wt 196.0 lb

## 2019-08-17 DIAGNOSIS — R7303 Prediabetes: Secondary | ICD-10-CM

## 2019-08-17 DIAGNOSIS — E7800 Pure hypercholesterolemia, unspecified: Secondary | ICD-10-CM | POA: Diagnosis not present

## 2019-08-17 DIAGNOSIS — M545 Low back pain, unspecified: Secondary | ICD-10-CM

## 2019-08-17 DIAGNOSIS — I10 Essential (primary) hypertension: Secondary | ICD-10-CM

## 2019-08-17 MED ORDER — ATORVASTATIN CALCIUM 40 MG PO TABS: 40.0000 mg | ORAL_TABLET | Freq: Every day | ORAL | 3 refills | Status: DC

## 2019-08-17 NOTE — Telephone Encounter (Signed)
I called and spoke with [facility], they did not receive fax yesterday. I faxed surgical clearance again. Will check back later today to make sure they received fax.

## 2019-08-17 NOTE — Progress Notes (Signed)
Subjective:    Patient ID: Antonio Clark, male    DOB: 1935/12/18, 84 y.o.   MRN: [id number]  HPI  Patient is a very pleasant 84 year old Caucasian male here today for regular diabetes follow-up.  Unfortunately he was recently diagnosed with melanoma of the right chest below his right nipple.  This was surgically excised.  He is going for lymph node biopsy later this month.  He is reporting occasional low back pain with forward flexion.  His most recent lab work is listed below.  Hemoglobin A1c has increased from 6.6-7.0 but is still acceptable.  LDL cholesterol however has risen from just barely over 7010 in the 90s and he has a history of coronary artery disease.  He is taking pravastatin 40 mg a day.  He denies any history of side effects due to the Lipitor Crestor.  He denies any neuropathy in his feet.  He denies any polyuria, polydipsia, or blurry vision.  He denies any chest pain shortness of breath or dyspnea on exertion. Lab on 08/14/2019  Component Date Value Ref Range Status  . Hgb A1c MFr Bld 08/14/2019 7.0* <5.7 % of total Hgb Final   Comment: For someone without known diabetes, a hemoglobin A1c value of 6.5% or greater indicates that they may have  diabetes and this should be confirmed with a follow-up  test. . For someone with known diabetes, a value <7% indicates  that their diabetes is well controlled and a value  greater than or equal to 7% indicates suboptimal  control. A1c targets should be individualized based on  duration of diabetes, age, comorbid conditions, and  other considerations. . Currently, no consensus exists regarding use of hemoglobin A1c for diagnosis of diabetes for children. .   . Mean Plasma Glucose 08/14/2019 154  (calc) Final  . eAG (mmol/L) 08/14/2019 8.5  (calc) Final  . Cholesterol 08/14/2019 167  <200 mg/dL Final  . HDL 08/14/2019 45  > OR = 40 mg/dL Final  . Triglycerides 08/14/2019 184* <150 mg/dL Final  . LDL Cholesterol (Calc) 08/14/2019  94  mg/dL (calc) Final   Comment: Reference range: <100 . Desirable range <100 mg/dL for primary prevention;   <70 mg/dL for patients with CHD or diabetic patients  with > or = 2 CHD risk factors. Marland Kitchen LDL-C is now calculated using the Martin-Hopkins  calculation, which is a validated novel method providing  better accuracy than the Friedewald equation in the  estimation of LDL-C.  Cresenciano Genre et al. Annamaria Helling. [id number]): 2061-2068  (http://education.[facility].com/faq/FAQ164)   . Total CHOL/HDL Ratio 08/14/2019 3.7  <5.0 (calc) Final  . Non-HDL Cholesterol (Calc) 08/14/2019 122  <130 mg/dL (calc) Final   Comment: For patients with diabetes plus 1 major ASCVD risk  factor, treating to a non-HDL-C goal of <100 mg/dL  (LDL-C of <70 mg/dL) is considered a therapeutic  option.   . Glucose, Bld 08/14/2019 180* 65 - 99 mg/dL Final   Comment: .            Fasting reference interval . For someone without known diabetes, a glucose value >125 mg/dL indicates that they may have diabetes and this should be confirmed with a follow-up test. .   . BUN 08/14/2019 23  7 - 25 mg/dL Final  . Creat 08/14/2019 1.02  0.70 - 1.11 mg/dL Final   Comment: For patients >26 years of age, the reference limit for Creatinine is approximately 13% higher for people identified as African-American. .   Havery Moros  Ratio [id number] NOT APPLICABLE  6 - 22 (calc) Final  . Sodium 08/14/2019 139  135 - 146 mmol/L Final  . Potassium 08/14/2019 4.3  3.5 - 5.3 mmol/L Final  . Chloride 08/14/2019 105  98 - 110 mmol/L Final  . CO2 08/14/2019 25  20 - 32 mmol/L Final  . Calcium 08/14/2019 9.4  8.6 - 10.3 mg/dL Final  . Total Protein 08/14/2019 6.6  6.1 - 8.1 g/dL Final  . Albumin 08/14/2019 4.3  3.6 - 5.1 g/dL Final  . Globulin 08/14/2019 2.3  1.9 - 3.7 g/dL (calc) Final  . AG Ratio 08/14/2019 1.9  1.0 - 2.5 (calc) Final  . Total Bilirubin 08/14/2019 0.4  0.2 - 1.2 mg/dL Final  . Alkaline phosphatase (APISO)  08/14/2019 47  35 - 144 U/L Final  . AST 08/14/2019 15  10 - 35 U/L Final  . ALT 08/14/2019 11  9 - 46 U/L Final  . WBC 08/14/2019 5.4  3.8 - 10.8 Thousand/uL Final  . RBC 08/14/2019 4.62  4.20 - 5.80 Million/uL Final  . Hemoglobin 08/14/2019 13.6  13.2 - 17.1 g/dL Final  . HCT 08/14/2019 40.6  38.5 - 50.0 % Final  . MCV 08/14/2019 87.9  80.0 - 100.0 fL Final  . MCH 08/14/2019 29.4  27.0 - 33.0 pg Final  . MCHC 08/14/2019 33.5  32.0 - 36.0 g/dL Final  . RDW 08/14/2019 12.5  11.0 - 15.0 % Final  . Platelets 08/14/2019 140  140 - 400 Thousand/uL Final  . MPV 08/14/2019 9.9  7.5 - 12.5 fL Final  . Neutro Abs 08/14/2019 2,840  1,500 - 7,800 cells/uL Final  . Lymphs Abs 08/14/2019 1,766  850 - 3,900 cells/uL Final  . Absolute Monocytes 08/14/2019 524  200 - 950 cells/uL Final  . Eosinophils Absolute 08/14/2019 232  15 - 500 cells/uL Final  . Basophils Absolute 08/14/2019 38  0 - 200 cells/uL Final  . Neutrophils Relative % 08/14/2019 52.6  % Final  . Total Lymphocyte 08/14/2019 32.7  % Final  . Monocytes Relative 08/14/2019 9.7  % Final  . Eosinophils Relative 08/14/2019 4.3  % Final  . Basophils Relative 08/14/2019 0.7  % Final    Past Medical History:  Diagnosis Date  . Arthritis   . Atrial fibrillation ([place])   . Back pain   . Borderline diabetic   . Dysrhythmia    a-fib  . GERD (gastroesophageal reflux disease)   . History of echocardiogram 07/ 07/ 2011  . History of lithotripsy 1989  . Hyperlipidemia   . Hypertension   . Melanoma ([place])    right chest wall (12/20)  . Pre-diabetes   . Prostate CA ([place]) 2010  . Sleep apnea    uses CPAP nightly   Past Surgical History:  Procedure Laterality Date  . COLONOSCOPY  2002  . FASCIECTOMY Left 03/28/2019   Procedure: SEGMENTAL FASCIECTOMY LEFT RING FINGER;  Surgeon: Daryll Brod, MD;  Location: [facility];  Service: Orthopedics;  Laterality: Left;  ANESTHESIA  AXILLARY BLOCK  . INSERTION PROSTATE RADIATION SEED  8 4  2010   per Dr Rosana Hoes  . MELANOMA EXCISION Right 06/22/2019   Procedure: WIDE LOCAL EXCISION RIGHT CHEST WALL MELANOMA, ADVANCEMENT FLAP CLOSURE FOR DEFECT 3X6 CM;  Surgeon: Stark Klein, MD;  Location: [facility];  Service: General;  Laterality: Right;  . POLYPECTOMY  2002  . THROAT SURGERY  1980   benign cyst  . URETHRAL STRICTURE DILATATION  also penile implant   Current Outpatient Medications on File Prior to Visit  Medication Sig Dispense Refill  . clobetasol ointment (TEMOVATE) [id number] % Apply 1 application topically as needed (skin irritation).     . hydrochlorothiazide (HYDRODIURIL) 25 MG tablet Take 1 tablet (25 mg total) by mouth daily. 25 tablet 9  . multivitamin (THERAGRAN) per tablet Take 1 tablet by mouth daily.      . Omega-3 Fatty Acids (FISH OIL) 1200 MG CAPS Take 1,200 mg by mouth daily.      Marland Kitchen omeprazole (PRILOSEC) 20 MG capsule Take 20 mg by mouth as needed (heartburn).    Marland Kitchen PRADAXA 150 MG CAPS capsule TAKE 1 CAPSULE BY MOUTH TWICE A DAY 60 capsule 5  . pravastatin (PRAVACHOL) 40 MG tablet Take 1 tablet (40 mg total) by mouth daily. Please keep upcoming appt for future refills. Thank you 90 tablet 0  . Tamsulosin HCl (FLOMAX) 0.4 MG CAPS Take 0.4 mg by mouth daily.      . valsartan (DIOVAN) 320 MG tablet Take 1 tablet (320 mg total) by mouth daily. 90 tablet 3   No current facility-administered medications on file prior to visit.   No Known Allergies Social History   Socioeconomic History  . Marital status: Single    Spouse name: Not on file  . Number of children: Not on file  . Years of education: Not on file  . Highest education level: Not on file  Occupational History  . Occupation: retired  Tobacco Use  . Smoking status: Never Smoker  . Smokeless tobacco: Never Used  Substance and Sexual Activity  . Alcohol use: Yes    Comment: social  . Drug use: No  . Sexual activity: Yes  Other Topics Concern  . Not on file  Social History  Narrative  . Not on file   Social Determinants of Health   Financial Resource Strain:   . Difficulty of Paying Living Expenses: Not on file  Food Insecurity:   . Worried About Charity fundraiser in the Last Year: Not on file  . Ran Out of Food in the Last Year: Not on file  Transportation Needs:   . Lack of Transportation (Medical): Not on file  . Lack of Transportation (Non-Medical): Not on file  Physical Activity:   . Days of Exercise per Week: Not on file  . Minutes of Exercise per Session: Not on file  Stress:   . Feeling of Stress : Not on file  Social Connections:   . Frequency of Communication with Friends and Family: Not on file  . Frequency of Social Gatherings with Friends and Family: Not on file  . Attends Religious Services: Not on file  . Active Member of Clubs or Organizations: Not on file  . Attends Archivist Meetings: Not on file  . Marital Status: Not on file  Intimate Partner Violence:   . Fear of Current or Ex-Partner: Not on file  . Emotionally Abused: Not on file  . Physically Abused: Not on file  . Sexually Abused: Not on file   Family History  Problem Relation Age of Onset  . Coronary artery disease Brother        3 brothers had CABG  . Arrhythmia Brother   . Diabetes Brother   . Hearing loss Brother   . Hypertension Brother   . Heart disease Brother   . Heart failure Brother   . Hypertension Brother   . Heart disease Father   .  Hypertension Father   . Sudden death Father   . Stroke Mother        cerebral hemorrage  . Colon cancer Mother 74       small intestine  . Cancer Mother   . Suicidality Sister   . Stroke Sister   . Diabetes Brother   . Hypertension Brother   . Hyperlipidemia Sister   . Esophageal cancer Neg Hx   . Pancreatic cancer Neg Hx   . Prostate cancer Neg Hx   . Rectal cancer Neg Hx   . Stomach cancer Neg Hx      Review of Systems  Musculoskeletal: Positive for back pain.  All other systems reviewed and  are negative.      Objective:   Physical Exam  Constitutional: He is oriented to person, place, and time. He appears well-developed and well-nourished. No distress.  HENT:  Head: Normocephalic and atraumatic.  Right Ear: External ear normal.  Left Ear: External ear normal.  Nose: Nose normal.  Mouth/Throat: Oropharynx is clear and moist. No oropharyngeal exudate.  Eyes: Pupils are equal, round, and reactive to light. Conjunctivae and EOM are normal. Right eye exhibits no discharge. Left eye exhibits no discharge. No scleral icterus.  Neck: No JVD present. No tracheal deviation present. No thyromegaly present.  Cardiovascular: Normal rate, regular rhythm, normal heart sounds and intact distal pulses. Exam reveals no gallop and no friction rub.  No murmur heard. Pulmonary/Chest: Effort normal and breath sounds normal. No stridor. No respiratory distress. He has no wheezes. He has no rales. He exhibits no tenderness.  Abdominal: Soft. Bowel sounds are normal. He exhibits no distension and no mass. There is no abdominal tenderness. There is no rebound and no guarding.  Musculoskeletal:        General: No tenderness or edema. Normal range of motion.     Cervical back: Normal range of motion and neck supple.  Lymphadenopathy:    He has no cervical adenopathy.  Neurological: He is alert and oriented to person, place, and time. He has normal reflexes. No cranial nerve deficit. He exhibits normal muscle tone. Coordination normal.  Skin: Skin is warm. No rash noted. He is not diaphoretic. No erythema. No pallor.  Psychiatric: He has a normal mood and affect. His behavior is normal. Judgment and thought content normal.  Vitals reviewed. e    Assessment & Plan:  Low back pain at multiple sites - Plan: DG Lumbar Spine Complete  Benign essential HTN  Pure hypercholesterolemia  Borderline diabetic  A1c is still acceptable at 7.0.  Encouraged the patient a low carbohydrate diet.  Blood  pressure today is elevated however he is watching his blood pressure every day at home and says it is consistently less than 140/90.  He states that this is only because he is nervous here.  Cholesterol is too high given his history of coronary artery disease so I recommended discontinuing pravastatin and replacing it with Lipitor 40 mg a day.  Given the patient's low back pain and his recent diagnosis of melanoma, I have recommended an x-ray of the lumbar spine however I suspect musculoskeletal pain and will treated as such if the x-ray is normal.  Patient will call me if his blood pressures greater than 140/90 we can add amlodipine.  Recheck in 6 months or as needed.

## 2019-08-18 ENCOUNTER — Other Ambulatory Visit: Payer: Self-pay

## 2019-08-18 ENCOUNTER — Encounter (HOSPITAL_BASED_OUTPATIENT_CLINIC_OR_DEPARTMENT_OTHER)
Admission: RE | Admit: 2019-08-18 | Discharge: 2019-08-18 | Disposition: A | Discharge status: Home / Self Care | Payer: Medicare Other | Source: Ambulatory Visit | Attending: General Surgery | Admitting: General Surgery

## 2019-08-18 DIAGNOSIS — Z01812 Encounter for preprocedural laboratory examination: Secondary | ICD-10-CM | POA: Diagnosis not present

## 2019-08-18 LAB — BASIC METABOLIC PANEL
Anion gap: 9 (ref 5–15)
BUN: 23 mg/dL (ref 8–23)
CO2: 24 mmol/L (ref 22–32)
Calcium: 9.6 mg/dL (ref 8.9–10.3)
Chloride: 105 mmol/L (ref 98–111)
Creatinine, Ser: 1.12 mg/dL (ref 0.61–1.24)
GFR calc Af Amer: >60 mL/min (ref >60)
GFR calc non Af Amer: >60 mL/min (ref >60)
Glucose, Bld: 159 mg/dL — ABNORMAL HIGH (ref 70–99)
Potassium: 4.6 mmol/L (ref 3.5–5.1)
Sodium: 138 mmol/L (ref 135–145)

## 2019-08-18 MED ORDER — ENSURE PRE-SURGERY PO LIQD: 296.0000 mL | Freq: Once | ORAL | Status: DC

## 2019-08-18 NOTE — Progress Notes (Signed)

## 2019-08-19 ENCOUNTER — Other Ambulatory Visit (HOSPITAL_COMMUNITY)
Admission: RE | Admit: 2019-08-19 | Discharge: 2019-08-19 | Disposition: A | Discharge status: Home / Self Care | Payer: Medicare Other | Source: Ambulatory Visit | Attending: General Surgery | Admitting: General Surgery

## 2019-08-19 DIAGNOSIS — Z20822 Contact with and (suspected) exposure to covid-19: Secondary | ICD-10-CM | POA: Diagnosis not present

## 2019-08-19 DIAGNOSIS — Z01812 Encounter for preprocedural laboratory examination: Secondary | ICD-10-CM | POA: Insufficient documentation

## 2019-08-20 LAB — NOVEL CORONAVIRUS, NAA (HOSP ORDER, SEND-OUT TO REF LAB; TAT 18-24 HRS): SARS-CoV-2, NAA: NOT DETECTED

## 2019-08-22 ENCOUNTER — Encounter (HOSPITAL_BASED_OUTPATIENT_CLINIC_OR_DEPARTMENT_OTHER): Payer: Self-pay | Admitting: General Surgery

## 2019-08-22 NOTE — H&P (Signed)
Antonio Clark Documented: 07/24/2019 9:12 AM Location: [facility] Patient #: [id number] DOB: [birth year] Single / Language: Antonio Clark / Race: Simer Male   History of Present Illness Antonio Klein MD; 07/24/2019 9:44 AM) The patient is a 84 year old male who presents for a follow-up for Malignant melanoma. Pt is an 84 yo M who is referred by Dr. Denna Clark with a new diagnosis of malignant melanoma of the right chest wall. He presented with an abnormal lesion on his right chest wall. He stated that it was just reddish, but was getting larger. Dr. Denna Clark told him that without the history of the recent change, he might not have biopsied it. He has history of basal cell and squamous cell carcinomas. He had three things biopsied and the right chest wall ended up being melanoma. The breslow depth was 0.5 mm wtih positive peripheral margins and focally positive deep margin. There was no LVI, neurotropism, or satellitosis. TIL were normal. No appreciable mitoses were seen. No ulceration or regression was seen. 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   Biopsy path was at [facility]. 05/01/2019   Pt underwent wide local excision with advancement flap closure 06/22/2019. Unfortunately, his melanoma was upstaged significantly. His depth was 2.7 mm, margins were free. He is doign well. He is just a little sore.   pathology 06/22/2019 A. SKIN, RIGHT CHEST WALL, EXCISION: RESIDUAL MALIGNANT MELANOMA, 2.7 MM IN DEPTH, STAGE UPSTAGED TO T3A NX MX; PLEASE SEE MELANOMA TABLE MELANOMA TABLE (AJCC 8TH EDITION*) PROCEDURE: EXCISION SPECIMEN ANATOMIC SITE: RIGHT CHEST WALL HISTOLOGIC TYPE: SUPERFICIAL SPREADING BRESLOW'S DEPTH/MAXIMUM TUMOR THICKNESS: 2.7 MM (block A6) CLARK/ANATOMIC LEVEL: IV MARGINS PERIPHERAL MARGINS: FREE DEEP MARGIN:  FREE ULCERATION: ABSENT SATELLITOSIS: ABSENT MITOTIC INDEX: 1/mm2 LYMPHO-VASCULAR INVASION: PRESENT (block A8) NEUROTROPISM: ABSENT TUMOR-INFILTRATING LYMPHOCYTES: NON-BRISK TUMOR REGRESSION: ABSENT LYMPH NODES (IF APPLICABLE): N/A PATHOLOGIC STAGE: PT3a COMMENT: THE MARGINS ARE FREE. MELAN-A AND SOX-10 WERE USED IN SELECTED SECTIONS TO EVALUATE THE MELANOMA. DR. Andria Clark IS IN AGREEMENT WITH THE DIAGNOSIS. *There is an incidental dysplastic nevus with moderate atypia; also free at the margin.   Allergies (Antonio Clark, CMA; 07/24/2019 9:25 AM) No Known Drug Allergies [06/05/2019]:  Medication History (Antonio Clark, CMA; 07/24/2019 9:25 AM) Clobetasol & Clobetasol Emul (0.05 & 0.05% Misc, External) Active. hydroCHLOROthiazide (12.5MG  Tablet, Oral) Active. Multivitamin (Oral) Active. Fish Oil (1000MG  Capsule, Oral) Active. Omeprazole (40MG  Capsule DR, Oral) Active. Pradaxa (150MG  Capsule, Oral) Active. Pravastatin Sodium (40MG  Tablet, Oral) Active. Tamsulosin HCl (0.4MG  Capsule, Oral) Active. predniSONE (20MG  Tablet, Oral) Active. traMADol HCl (50MG  Tablet, Oral) Active. Valsartan (320MG  Tablet, Oral) Active. Medications Reconciled    Review of Systems Antonio Klein MD; 07/24/2019 9:44 AM) All other systems negative  Vitals (Antonio Clark [place]; 07/24/2019 9:26 AM) 07/24/2019 9:25 AM Weight: 198.13 lb Height: 69in Body Surface Area: 2.06 m Body Mass Index: 29.26 kg/m  Temp.: 97.16F(Tympanic)  Pulse: 94 (Regular)  P.OX: 98% (Room air) BP: 118/62 (Sitting, Left Arm, Standard)       Physical Exam Antonio Klein MD; 07/24/2019 9:44 AM) Chest and Lung Exam Note: right chest wall incision healing well. sutures removed. no erythema. replaced with steristrips.     Assessment & Plan Antonio Klein MD; 07/24/2019 9:45 AM) MALIGNANT  MELANOMA OF SKIN OF CHEST (C43.59) Impression: Pt has been upstaged. Will do SLn bx. Will also order chest CT.  If SLN or CT positive, will send to oncology. Current Plans CT CHEST W CON ([address]) (r/o metastatic dx from melanoma)  You are being scheduled for surgery- Our schedulers will call you.  You should hear from our office's scheduling department within 5 working days about the location, date, and time of surgery. We try to make accommodations for patient's preferences in scheduling surgery, but sometimes the OR schedule or the surgeon's schedule prevents [place] from making those accommodations.  If you have not heard from our office [phone]) in 5 working days, call the office and ask for your surgeon's nurse.  If you have other questions about your diagnosis, plan, or surgery, call the office and ask for your surgeon's nurse.

## 2019-08-22 NOTE — Anesthesia Preprocedure Evaluation (Addendum)
Anesthesia Evaluation  Patient identified by MRN, date of birth, ID band Patient awake    Reviewed: Allergy & Precautions, NPO status , Patient's Chart, lab work & pertinent test results, reviewed documented beta blocker date and time   Airway Mallampati: II  TM Distance: >3 FB Neck ROM: Full    Dental  (+) Teeth Intact, Caps, Implants, Missing   Pulmonary sleep apnea and Continuous Positive Airway Pressure Ventilation ,    Pulmonary exam normal breath sounds clear to auscultation       Cardiovascular hypertension, Pt. on medications and Pt. on home beta blockers + CAD  Normal cardiovascular exam+ dysrhythmias Atrial Fibrillation  Rhythm:Regular Rate:Normal     Neuro/Psych negative neurological ROS  negative psych ROS   GI/Hepatic Neg liver ROS, GERD  ,  Endo/Other  Hyperlipidemia Borderline DM  Renal/GU Renal diseaseHx/o renal calculi   Hx/o prostate Ca Hx/o urethral stricture    Musculoskeletal  (+) Arthritis , Osteoarthritis,  Hx/o melanoma back   Abdominal   Peds  Hematology pradaxa therapy- last dose 08/19/2019   Anesthesia Other Findings   Reproductive/Obstetrics                           Anesthesia Physical Anesthesia Plan  ASA: III  Anesthesia Plan: General   Post-op Pain Management:    Induction: Intravenous  PONV Risk Score and Plan:   Airway Management Planned: LMA  Additional Equipment:   Intra-op Plan:   Post-operative Plan: Extubation in OR  Informed Consent: I have reviewed the patients History and Physical, chart, labs and discussed the procedure including the risks, benefits and alternatives for the proposed anesthesia with the patient or authorized representative who has indicated his/her understanding and acceptance.     Dental advisory given  Plan Discussed with: CRNA and Surgeon  Anesthesia Plan Comments:        Anesthesia Quick Evaluation

## 2019-08-23 ENCOUNTER — Other Ambulatory Visit: Payer: Self-pay

## 2019-08-23 ENCOUNTER — Ambulatory Visit (HOSPITAL_BASED_OUTPATIENT_CLINIC_OR_DEPARTMENT_OTHER): Payer: Medicare Other | Admitting: Anesthesiology

## 2019-08-23 ENCOUNTER — Ambulatory Visit (HOSPITAL_BASED_OUTPATIENT_CLINIC_OR_DEPARTMENT_OTHER)
Admission: RE | Admit: 2019-08-23 | Discharge: 2019-08-23 | Disposition: A | Discharge status: Home / Self Care | Payer: Medicare Other | Attending: General Surgery | Admitting: General Surgery

## 2019-08-23 ENCOUNTER — Encounter (HOSPITAL_BASED_OUTPATIENT_CLINIC_OR_DEPARTMENT_OTHER)
Admission: RE | Disposition: A | Discharge status: Home / Self Care | Payer: Self-pay | Source: Home / Self Care | Attending: General Surgery

## 2019-08-23 ENCOUNTER — Encounter (HOSPITAL_BASED_OUTPATIENT_CLINIC_OR_DEPARTMENT_OTHER): Payer: Self-pay | Admitting: General Surgery

## 2019-08-23 ENCOUNTER — Ambulatory Visit (HOSPITAL_COMMUNITY)
Admission: RE | Admit: 2019-08-23 | Discharge: 2019-08-23 | Disposition: A | Discharge status: Home / Self Care | Payer: Medicare Other | Source: Ambulatory Visit | Attending: General Surgery | Admitting: General Surgery

## 2019-08-23 DIAGNOSIS — M1990 Unspecified osteoarthritis, unspecified site: Secondary | ICD-10-CM | POA: Insufficient documentation

## 2019-08-23 DIAGNOSIS — R7303 Prediabetes: Secondary | ICD-10-CM | POA: Insufficient documentation

## 2019-08-23 DIAGNOSIS — Z85828 Personal history of other malignant neoplasm of skin: Secondary | ICD-10-CM | POA: Insufficient documentation

## 2019-08-23 DIAGNOSIS — Z8546 Personal history of malignant neoplasm of prostate: Secondary | ICD-10-CM | POA: Diagnosis not present

## 2019-08-23 DIAGNOSIS — C4359 Malignant melanoma of other part of trunk: Secondary | ICD-10-CM

## 2019-08-23 DIAGNOSIS — G4730 Sleep apnea, unspecified: Secondary | ICD-10-CM | POA: Insufficient documentation

## 2019-08-23 DIAGNOSIS — Z79899 Other long term (current) drug therapy: Secondary | ICD-10-CM | POA: Diagnosis not present

## 2019-08-23 DIAGNOSIS — Z87442 Personal history of urinary calculi: Secondary | ICD-10-CM | POA: Diagnosis not present

## 2019-08-23 DIAGNOSIS — I4891 Unspecified atrial fibrillation: Secondary | ICD-10-CM | POA: Diagnosis not present

## 2019-08-23 DIAGNOSIS — E785 Hyperlipidemia, unspecified: Secondary | ICD-10-CM | POA: Insufficient documentation

## 2019-08-23 DIAGNOSIS — K219 Gastro-esophageal reflux disease without esophagitis: Secondary | ICD-10-CM | POA: Insufficient documentation

## 2019-08-23 DIAGNOSIS — I2510 Atherosclerotic heart disease of native coronary artery without angina pectoris: Secondary | ICD-10-CM | POA: Insufficient documentation

## 2019-08-23 DIAGNOSIS — I10 Essential (primary) hypertension: Secondary | ICD-10-CM | POA: Diagnosis not present

## 2019-08-23 HISTORY — PX: AXILLARY SENTINEL NODE BIOPSY: SHX5738

## 2019-08-23 SURGERY — BIOPSY, LYMPH NODE, SENTINEL, AXILLARY
Anesthesia: General | Site: Axilla | Laterality: Right

## 2019-08-23 MED ORDER — FENTANYL CITRATE (PF) 100 MCG/2ML IJ SOLN: 25.0000 ug | INTRAMUSCULAR | Status: DC | PRN

## 2019-08-23 MED ORDER — OXYCODONE HCL 5 MG PO TABS: 5.0000 mg | ORAL_TABLET | Freq: Once | ORAL | Status: DC | PRN

## 2019-08-23 MED ORDER — MIDAZOLAM HCL 2 MG/2ML IJ SOLN: 1.0000 mg | INTRAMUSCULAR | Status: DC | PRN

## 2019-08-23 MED ORDER — LIDOCAINE-EPINEPHRINE 0.5 %-1:200000 IJ SOLN
INTRAMUSCULAR | Status: AC
  Filled 2019-08-23: qty 2

## 2019-08-23 MED ORDER — LIDOCAINE-EPINEPHRINE 0.5 %-1:200000 IJ SOLN
INTRAMUSCULAR | Status: DC | PRN
  Administered 2019-08-23: 10 mL

## 2019-08-23 MED ORDER — MEPERIDINE HCL 25 MG/ML IJ SOLN: 6.2500 mg | INTRAMUSCULAR | Status: DC | PRN

## 2019-08-23 MED ORDER — ONDANSETRON HCL 4 MG/2ML IJ SOLN: 4.0000 mg | Freq: Once | INTRAMUSCULAR | Status: DC | PRN

## 2019-08-23 MED ORDER — DEXAMETHASONE SODIUM PHOSPHATE 10 MG/ML IJ SOLN
INTRAMUSCULAR | Status: DC | PRN
  Administered 2019-08-23: 5 mg via INTRAVENOUS

## 2019-08-23 MED ORDER — ACETAMINOPHEN 500 MG PO TABS
1000.0000 mg | ORAL_TABLET | ORAL | Status: AC
  Administered 2019-08-23: 1000 mg via ORAL

## 2019-08-23 MED ORDER — CHLORHEXIDINE GLUCONATE CLOTH 2 % EX PADS: 6.0000 | MEDICATED_PAD | Freq: Once | CUTANEOUS | Status: DC

## 2019-08-23 MED ORDER — LIDOCAINE 2% (20 MG/ML) 5 ML SYRINGE
INTRAMUSCULAR | Status: DC | PRN
  Administered 2019-08-23: 50 mg via INTRAVENOUS

## 2019-08-23 MED ORDER — MIDAZOLAM HCL 2 MG/2ML IJ SOLN
INTRAMUSCULAR | Status: AC
  Filled 2019-08-23: qty 2

## 2019-08-23 MED ORDER — GABAPENTIN 100 MG PO CAPS
100.0000 mg | ORAL_CAPSULE | ORAL | Status: AC
  Administered 2019-08-23: 100 mg via ORAL

## 2019-08-23 MED ORDER — TRAMADOL HCL 50 MG PO TABS: 50.0000 mg | ORAL_TABLET | Freq: Three times a day (TID) | ORAL | 0 refills | Status: DC | PRN

## 2019-08-23 MED ORDER — BUPIVACAINE HCL (PF) 0.25 % IJ SOLN
INTRAMUSCULAR | Status: AC
  Filled 2019-08-23: qty 30

## 2019-08-23 MED ORDER — ACETAMINOPHEN 500 MG PO TABS
ORAL_TABLET | ORAL | Status: AC
  Filled 2019-08-23: qty 2

## 2019-08-23 MED ORDER — PROPOFOL 10 MG/ML IV BOLUS
INTRAVENOUS | Status: DC | PRN
  Administered 2019-08-23: 140 mg via INTRAVENOUS

## 2019-08-23 MED ORDER — BUPIVACAINE HCL (PF) 0.25 % IJ SOLN
INTRAMUSCULAR | Status: DC | PRN
  Administered 2019-08-23: 10 mL

## 2019-08-23 MED ORDER — TECHNETIUM TC 99M SULFUR COLLOID FILTERED
0.5000 | Freq: Once | INTRAVENOUS | Status: AC | PRN
  Administered 2019-08-23: 08:00:00 0.5 via INTRADERMAL

## 2019-08-23 MED ORDER — LACTATED RINGERS IV SOLN: INTRAVENOUS | Status: DC

## 2019-08-23 MED ORDER — ONDANSETRON HCL 4 MG/2ML IJ SOLN
INTRAMUSCULAR | Status: DC | PRN
  Administered 2019-08-23: 4 mg via INTRAVENOUS

## 2019-08-23 MED ORDER — FENTANYL CITRATE (PF) 100 MCG/2ML IJ SOLN
50.0000 ug | INTRAMUSCULAR | Status: DC | PRN
  Administered 2019-08-23: 09:00:00 25 ug via INTRAVENOUS

## 2019-08-23 MED ORDER — GABAPENTIN 100 MG PO CAPS
ORAL_CAPSULE | ORAL | Status: AC
  Filled 2019-08-23: qty 1

## 2019-08-23 MED ORDER — OXYCODONE HCL 5 MG/5ML PO SOLN: 5.0000 mg | Freq: Once | ORAL | Status: DC | PRN

## 2019-08-23 MED ORDER — CEFAZOLIN SODIUM-DEXTROSE 2-4 GM/100ML-% IV SOLN
INTRAVENOUS | Status: AC
  Filled 2019-08-23: qty 100

## 2019-08-23 MED ORDER — PHENYLEPHRINE 40 MCG/ML (10ML) SYRINGE FOR IV PUSH (FOR BLOOD PRESSURE SUPPORT)
PREFILLED_SYRINGE | INTRAVENOUS | Status: DC | PRN
  Administered 2019-08-23 (×7): 80 ug via INTRAVENOUS

## 2019-08-23 MED ORDER — FENTANYL CITRATE (PF) 100 MCG/2ML IJ SOLN
INTRAMUSCULAR | Status: AC
  Filled 2019-08-23: qty 2

## 2019-08-23 MED ORDER — ACETAMINOPHEN 500 MG PO TABS: 1000.0000 mg | ORAL_TABLET | ORAL | Status: DC

## 2019-08-23 MED ORDER — PROPOFOL 10 MG/ML IV BOLUS
INTRAVENOUS | Status: AC
  Filled 2019-08-23: qty 40

## 2019-08-23 MED ORDER — CEFAZOLIN SODIUM-DEXTROSE 2-4 GM/100ML-% IV SOLN
2.0000 g | INTRAVENOUS | Status: AC
  Administered 2019-08-23: 2 g via INTRAVENOUS

## 2019-08-23 SURGICAL SUPPLY — 52 items
BLADE CLIPPER SURG (BLADE) ×4 IMPLANT
BLADE HEX COATED 2.75 (ELECTRODE) ×4 IMPLANT
BLADE SURG 10 STRL SS (BLADE) ×4 IMPLANT
BLADE SURG 15 STRL LF DISP TIS (BLADE) ×2 IMPLANT
BLADE SURG 15 STRL SS (BLADE) ×2
BNDG COHESIVE 4X5 TAN STRL (GAUZE/BANDAGES/DRESSINGS) ×4 IMPLANT
CANISTER SUCT 1200ML W/VALVE (MISCELLANEOUS) ×4 IMPLANT
CHLORAPREP W/TINT 26 (MISCELLANEOUS) ×4 IMPLANT
CLIP VESOCCLUDE MED 6/CT (CLIP) ×12 IMPLANT
CLOSURE WOUND 1/2 X4 (GAUZE/BANDAGES/DRESSINGS) ×1
COVER MAYO STAND STRL (DRAPES) ×4 IMPLANT
COVER PROBE W GEL 5X96 (DRAPES) ×4 IMPLANT
DECANTER SPIKE VIAL GLASS SM (MISCELLANEOUS) IMPLANT
DERMABOND ADVANCED (GAUZE/BANDAGES/DRESSINGS) ×2
DERMABOND ADVANCED .7 DNX12 (GAUZE/BANDAGES/DRESSINGS) ×2 IMPLANT
DRAPE UTILITY XL STRL (DRAPES) ×4 IMPLANT
ELECT REM PT RETURN 9FT ADLT (ELECTROSURGICAL) ×4
ELECTRODE REM PT RTRN 9FT ADLT (ELECTROSURGICAL) ×2 IMPLANT
GLOVE BIO SURGEON STRL SZ 6 (GLOVE) ×4 IMPLANT
GLOVE BIO SURGEON STRL SZ 6.5 (GLOVE) ×6 IMPLANT
GLOVE BIO SURGEONS STRL SZ 6.5 (GLOVE) ×2
GLOVE BIOGEL PI IND STRL 6.5 (GLOVE) ×4 IMPLANT
GLOVE BIOGEL PI IND STRL 7.0 (GLOVE) ×2 IMPLANT
GLOVE BIOGEL PI INDICATOR 6.5 (GLOVE) ×4
GLOVE BIOGEL PI INDICATOR 7.0 (GLOVE) ×2
GOWN STRL REUS W/ TWL LRG LVL3 (GOWN DISPOSABLE) ×4 IMPLANT
GOWN STRL REUS W/TWL 2XL LVL3 (GOWN DISPOSABLE) ×4 IMPLANT
GOWN STRL REUS W/TWL LRG LVL3 (GOWN DISPOSABLE) ×4
NEEDLE HYPO 25X1 1.5 SAFETY (NEEDLE) ×4 IMPLANT
NS IRRIG 1000ML POUR BTL (IV SOLUTION) ×4 IMPLANT
PACK BASIN DAY SURGERY FS (CUSTOM PROCEDURE TRAY) ×4 IMPLANT
PACK UNIVERSAL I (CUSTOM PROCEDURE TRAY) ×4 IMPLANT
PENCIL SMOKE EVACUATOR (MISCELLANEOUS) ×4 IMPLANT
SLEEVE SCD COMPRESS KNEE MED (MISCELLANEOUS) ×4 IMPLANT
SPONGE LAP 18X18 RF (DISPOSABLE) ×4 IMPLANT
STOCKINETTE IMPERVIOUS LG (DRAPES) ×4 IMPLANT
STRIP CLOSURE SKIN 1/2X4 (GAUZE/BANDAGES/DRESSINGS) ×3 IMPLANT
SUT ETHILON 2 0 FS 18 (SUTURE) IMPLANT
SUT MON AB 4-0 PC3 18 (SUTURE) ×4 IMPLANT
SUT SILK 2 0 SH (SUTURE) IMPLANT
SUT VIC AB 2-0 SH 27 (SUTURE)
SUT VIC AB 2-0 SH 27XBRD (SUTURE) IMPLANT
SUT VIC AB 3-0 SH 27 (SUTURE) ×2
SUT VIC AB 3-0 SH 27X BRD (SUTURE) ×2 IMPLANT
SYR BULB 3OZ (MISCELLANEOUS) ×4 IMPLANT
SYR CONTROL 10ML LL (SYRINGE) ×4 IMPLANT
SYR TB 1ML LL NO SAFETY (SYRINGE) ×4 IMPLANT
TOWEL GREEN STERILE FF (TOWEL DISPOSABLE) ×4 IMPLANT
TUBE CONNECTING 20'X1/4 (TUBING) ×1
TUBE CONNECTING 20X1/4 (TUBING) ×3 IMPLANT
UNDERPAD 30X36 HEAVY ABSORB (UNDERPADS AND DIAPERS) ×4 IMPLANT
YANKAUER SUCT BULB TIP NO VENT (SUCTIONS) ×4 IMPLANT

## 2019-08-23 NOTE — Interval H&P Note (Signed)
History and Physical Interval Note:  08/23/2019 9:11 AM  Antonio Clark  has presented today for surgery, with the diagnosis of RIGHT CHEST MELANOMA.  The various methods of treatment have been discussed with the patient and family. After consideration of risks, benefits and other options for treatment, the patient has consented to  Procedure(s): SENTINEL LYMPH NODE BIOSPY RIGHT AXILLA (Right) as a surgical intervention.  The patient's history has been reviewed, patient examined, no change in status, stable for surgery.  I have reviewed the patient's chart and labs.  Questions were answered to the patient's satisfaction.     Stark Klein

## 2019-08-23 NOTE — Anesthesia Procedure Notes (Signed)
Procedure Name: LMA Insertion Date/Time: 08/23/2019 9:28 AM Performed by: Gwyndolyn Saxon, CRNA Pre-anesthesia Checklist: Patient identified, Emergency Drugs available, Suction available and Patient being monitored Patient Re-evaluated:Patient Re-evaluated prior to induction Oxygen Delivery Method: Circle system utilized Preoxygenation: Pre-oxygenation with 100% oxygen Induction Type: IV induction Ventilation: Mask ventilation without difficulty LMA: LMA inserted LMA Size: 4.0 Number of attempts: 1 Placement Confirmation: positive ETCO2 and breath sounds checked- equal and bilateral Tube secured with: Tape Dental Injury: Teeth and Oropharynx as per pre-operative assessment

## 2019-08-23 NOTE — Progress Notes (Signed)
Timeout was performed with nuclear medicine.  Remained at bedside with patient, he tolerated the procedure well.

## 2019-08-23 NOTE — Transfer of Care (Signed)
Immediate Anesthesia Transfer of Care Note  Patient: Antonio Clark  Procedure(s) Performed: Efraim Kaufmann LYMPH NODE BIOSPY RIGHT AXILLA (Right Axilla)  Patient Location: PACU  Anesthesia Type:General  Level of Consciousness: awake and alert   Airway & Oxygen Therapy: Patient Spontanous Breathing and Patient connected to face mask oxygen  Post-op Assessment: Report given to RN and Post -op Vital signs reviewed and stable  Post vital signs: Reviewed and stable  Last Vitals:  Vitals Value Taken Time  BP 131/61 08/23/19 1017  Temp    Pulse 67 08/23/19 1019  Resp 20 08/23/19 1019  SpO2 100 % 08/23/19 1019  Vitals shown include unvalidated device data.  Last Pain:  Vitals:   08/23/19 0737  TempSrc: Tympanic  PainSc: 0-No pain         Complications: No apparent anesthesia complications

## 2019-08-23 NOTE — Anesthesia Postprocedure Evaluation (Signed)
Anesthesia Post Note  Patient: ASMIR LAUERSDORF  Procedure(s) Performed: SENTINEL LYMPH NODE BIOSPY RIGHT AXILLA (Right Axilla)     Patient location during evaluation: PACU Anesthesia Type: General Level of consciousness: awake and alert and oriented Pain management: pain level controlled Vital Signs Assessment: post-procedure vital signs reviewed and stable Respiratory status: spontaneous breathing, nonlabored ventilation and respiratory function stable Cardiovascular status: blood pressure returned to baseline and stable Postop Assessment: no apparent nausea or vomiting Anesthetic complications: no    Last Vitals:  Vitals:   08/23/19 1030 08/23/19 1045  BP: (!) 117/55 118/62  Pulse: 65 64  Resp: 15 14  Temp:    SpO2: 97% 98%    Last Pain:  Vitals:   08/23/19 1017  TempSrc:   PainSc: 0-No pain                 Teja Judice A.

## 2019-08-23 NOTE — Discharge Instructions (Addendum)
[facility] Office Phone Number [phone]   No Tylenol until after 4pm today.   POST OP INSTRUCTIONS  Always review your discharge instruction sheet given to you by the facility where your surgery was performed.  IF YOU HAVE DISABILITY OR FAMILY LEAVE FORMS, YOU MUST BRING THEM TO THE OFFICE FOR PROCESSING.  DO NOT GIVE THEM TO YOUR DOCTOR.  1. A prescription for pain medication may be given to you upon discharge.  Take your pain medication as prescribed, if needed.  If narcotic pain medicine is not needed, then you may take acetaminophen (Tylenol) or ibuprofen (Advil) as needed. 2. Take your usually prescribed medications unless otherwise directed 3. If you need a refill on your pain medication, please contact your pharmacy.  They will contact our office to request authorization.  Prescriptions will not be filled after 5pm or on week-ends. 4. You should eat very light the first 24 hours after surgery, such as soup, crackers, pudding, etc.  Resume your normal diet the day after surgery 5. It is common to experience some constipation if taking pain medication after surgery.  Increasing fluid intake and taking a stool softener will usually help or prevent this problem from occurring.  A mild laxative (Milk of Magnesia or Miralax) should be taken according to package directions if there are no bowel movements after 48 hours. 6. You may shower in 48 hours.  The surgical glue will flake off in 2-3 weeks.   7. ACTIVITIES:  No strenuous activity or heavy lifting for 1 week.   a. You may drive when you no longer are taking prescription pain medication, you can comfortably wear a seatbelt, and you can safely maneuver your car and apply brakes. b. RETURN TO WORK:  __________n/a_______________ Dennis Bast should see your doctor in the office for a follow-up appointment approximately three-four weeks after your surgery.    WHEN TO CALL YOUR DOCTOR: 1. Fever over 101.0 2. Nausea and/or  vomiting. 3. Extreme swelling or bruising. 4. Continued bleeding from incision. 5. Increased pain, redness, or drainage from the incision.  The clinic staff is available to answer your questions during regular business hours.  Please don't hesitate to call and ask to speak to one of the nurses for clinical concerns.  If you have a medical emergency, go to the nearest emergency room or call 911.  A surgeon from [facility] is always on call at the hospital.  For further questions, please visit [email or web address]       Post Anesthesia Home Care Instructions  Activity: Get plenty of rest for the remainder of the day. A responsible individual must stay with you for 24 hours following the procedure.  For the next 24 hours, DO NOT: -Drive a car -Paediatric nurse -Drink alcoholic beverages -Take any medication unless instructed by your physician -Make any legal decisions or sign important papers.  Meals: Start with liquid foods such as gelatin or soup. Progress to regular foods as tolerated. Avoid greasy, spicy, heavy foods. If nausea and/or vomiting occur, drink only clear liquids until the nausea and/or vomiting subsides. Call your physician if vomiting continues.  Special Instructions/Symptoms: Your throat may feel dry or sore from the anesthesia or the breathing tube placed in your throat during surgery. If this causes discomfort, gargle with warm salt water. The discomfort should disappear within 24 hours.  If you had a scopolamine patch placed behind your ear for the management of post- operative nausea and/or vomiting:  1. The medication in the  patch is effective for 72 hours, after which it should be removed.  Wrap patch in a tissue and discard in the trash. Wash hands thoroughly with soap and water. 2. You may remove the patch earlier than 72 hours if you experience unpleasant side effects which may include dry mouth, dizziness or visual disturbances. 3.  Avoid touching the patch. Wash your hands with soap and water after contact with the patch.

## 2019-08-23 NOTE — Op Note (Signed)
PRE-OPERATIVE DIAGNOSIS: pT3a cNx right chest wall melanoma  POST-OPERATIVE DIAGNOSIS:  Same  PROCEDURE:  Procedure(s): Right axillary sentinel lymph node mapping and biopsy  SURGEON:  Surgeon(s): Stark Klein, MD  ASSIST:  Carlena Hurl, PA-C  ANESTHESIA:   local and general  DRAINS: none   LOCAL MEDICATIONS USED:  MARCAINE    and XYLOCAINE   SPECIMEN:  Source of Specimen:  Three deep right axillary sentinel lymph nodes  FINDINGS:  SLN #1 hot, cps 144.  SLN #2 palpable.  SLN #3 hot, cps 95.  Background 0  DISPOSITION OF SPECIMEN:  PATHOLOGY  COUNTS:  YES  PLAN OF CARE: Discharge to home after PACU  PATIENT DISPOSITION:  PACU - hemodynamically stable.    PROCEDURE:   Pt was identified in the holding area, taken to the OR, and placed supine on the OR table.  General anesthesia was induced.  Time out was performed according to the surgical safety checklist.  When all was correct, we continued.  Methylene blue was not injected as no additional skin was planned to be resected.    The patient was placed into the supine position.   The right upper chest/axilla, and right upper arm were prepped and draped in sterile fashion.    The point of maximum signal intensity was identified with the neoprobe.  A 4 cm incision was made with a #15 blade.  The subcutaneous tissues were divided with the cautery.  A Weitlaner retractor was used to assist with visualization.  The tonsil clamp was used to bluntly dissect the axillary fat pad.  Three deep right axillary sentinel lymph nodes were identified as described above.  The lymphovascular channels were clipped with hemoclips.  The nodes were passed off as specimens.  Hemostasis was achieved with the cautery.  The axilla was irrigated and closed with 3-0 Vicryl deep dermal interrupted sutures and 4-0 Monocryl running subcuticular suture.  The axilla was dressed with dermabond.    Needle, sponge, and instrument counts were correct.  The patient was  awakened from anesthesia and taken to the PACU in stable condition.

## 2019-08-24 ENCOUNTER — Encounter: Payer: Self-pay | Admitting: *Deleted

## 2019-08-28 LAB — SURGICAL PATHOLOGY

## 2019-08-30 ENCOUNTER — Telehealth: Payer: Self-pay | Admitting: Family Medicine

## 2019-08-30 NOTE — Telephone Encounter (Signed)
Pt called and wanted to know if you thought shots in his back would help with his pain?

## 2019-08-31 ENCOUNTER — Other Ambulatory Visit: Payer: Self-pay

## 2019-08-31 DIAGNOSIS — M545 Low back pain, unspecified: Secondary | ICD-10-CM

## 2019-08-31 NOTE — Telephone Encounter (Signed)
Pt notified. Pt states he will try PT. Referral has been placed.

## 2019-08-31 NOTE — Telephone Encounter (Signed)
Xray shows diffuse arthritis show an isolated shot in one spot would not likely help. I would recommend PT first.

## 2019-09-01 ENCOUNTER — Ambulatory Visit: Payer: Medicare Other | Attending: Internal Medicine

## 2019-09-01 DIAGNOSIS — Z23 Encounter for immunization: Secondary | ICD-10-CM | POA: Insufficient documentation

## 2019-09-01 NOTE — Progress Notes (Signed)
   Covid-19 Vaccination Clinic  Name:  Antonio Clark    MRN: [id number] DOB: Sep 27, 1935  09/01/2019  [name] was observed post Covid-19 immunization for 15 minutes without incidence. He was provided with Vaccine Information Sheet and instruction to access the V-Safe system.   [name] was instructed to call 911 with any severe reactions post vaccine: Marland Kitchen Difficulty breathing  . Swelling of your face and throat  . A fast heartbeat  . A bad rash all over your body  . Dizziness and weakness    Immunizations Administered    Name Date Dose VIS Date Route   Pfizer COVID-19 Vaccine 09/01/2019 12:35 PM 0.3 mL 07/21/2019 Intramuscular   Manufacturer: [place]   Lot: [id number]   [place]: [id number]

## 2019-09-11 ENCOUNTER — Ambulatory Visit: Payer: Medicare Other | Attending: Family Medicine

## 2019-09-11 ENCOUNTER — Other Ambulatory Visit: Payer: Self-pay

## 2019-09-11 DIAGNOSIS — M2560 Stiffness of unspecified joint, not elsewhere classified: Secondary | ICD-10-CM | POA: Diagnosis not present

## 2019-09-11 DIAGNOSIS — M545 Low back pain, unspecified: Secondary | ICD-10-CM

## 2019-09-11 DIAGNOSIS — M62830 Muscle spasm of back: Secondary | ICD-10-CM | POA: Insufficient documentation

## 2019-09-11 NOTE — Patient Instructions (Signed)
Prone press ups  X 5-01 5-10 sec hold 2x/day,   Knee to chest 30 xec x 2-3 2x/day,  LTR  10-20 sec 2-3 reps RT and LT 2 x/day

## 2019-09-11 NOTE — Therapy (Signed)
[place], [place], [address] Phone: [phone]   Fax:  [phone]  Physical Therapy Evaluation  Patient Details  Name: Antonio Clark MRN: [id number] Date of Birth: [birth year] Referring Provider (PT): Jenna Luo, MD   Encounter Date: 09/11/2019  PT End of Session - 09/11/19 1014    Visit Number  1    Number of Visits  12    Date for PT Re-Evaluation  10/20/19    Authorization Type  MCR/UHC    PT Start Time  1020    PT Stop Time  1115    PT Time Calculation (min)  55 min    Activity Tolerance  Patient tolerated treatment well    Behavior During Therapy  [facility] for tasks assessed/performed       Past Medical History:  Diagnosis Date  . Arthritis   . Atrial fibrillation ([place])   . Back pain   . Borderline diabetic   . Dysrhythmia    a-fib  . GERD (gastroesophageal reflux disease)   . History of echocardiogram 07/ 07/ 2011  . History of lithotripsy 1989  . Hyperlipidemia   . Hypertension   . Melanoma ([place])    right chest wall (12/20)  . Pre-diabetes   . Prostate CA ([place]) 2010  . Sleep apnea    uses CPAP nightly    Past Surgical History:  Procedure Laterality Date  . AXILLARY SENTINEL NODE BIOPSY Right 08/23/2019   Procedure: SENTINEL LYMPH NODE BIOSPY RIGHT AXILLA;  Surgeon: Stark Klein, MD;  Location: [facility];  Service: General;  Laterality: Right;  . COLONOSCOPY  2002  . FASCIECTOMY Left 03/28/2019   Procedure: SEGMENTAL FASCIECTOMY LEFT RING FINGER;  Surgeon: Daryll Brod, MD;  Location: [facility];  Service: Orthopedics;  Laterality: Left;  ANESTHESIA  AXILLARY BLOCK  . INSERTION PROSTATE RADIATION SEED  8 4 2010   per Dr Rosana Hoes  . MELANOMA EXCISION Right 06/22/2019   Procedure: WIDE LOCAL EXCISION RIGHT CHEST WALL MELANOMA, ADVANCEMENT FLAP CLOSURE FOR DEFECT 3X6 CM;  Surgeon: Stark Klein, MD;  Location: [facility];  Service: General;   Laterality: Right;  . POLYPECTOMY  2002  . THROAT SURGERY  1980   benign cyst  . URETHRAL STRICTURE DILATATION     also penile implant    There were no vitals filed for this visit.   Subjective Assessment - 09/11/19 1013    Subjective  He reports lower back pain.  Since 09/2018 bending at 45 degrees ahs significant pain.  Pain post mission trip  not during.  no problems walking. Getting out of chair  causes pain .  Bending causes most pain.  Limited with yard work    Limitations  Lifting;Sitting;House hold activities;Other (comment)    How long can you sit comfortably?  as needed    How long can you stand comfortably?  As needed    How long can you walk comfortably?  as needed    Diagnostic tests  xray:  degenerative changes    Patient Stated Goals  Decrease pain with bending and activity.    Currently in Pain?  No/denies    Pain Score  --   6-7/10 with bending   Pain Location  Back    Pain Orientation  Right;Left;Lower   more on RT   Pain Descriptors / Indicators  Dull    Pain Type  Chronic pain    Pain Onset  More than a  month ago    Pain Frequency  Intermittent    Aggravating Factors   bending    Pain Relieving Factors  changing to more erect postion         [facility] PT Assessment - 09/11/19 0001      Assessment   Medical Diagnosis  LBP    Referring Provider (PT)  Jenna Luo, MD    Onset Date/Surgical Date  --   09/2018   Next MD Visit  AS needed    Prior Therapy  No      Precautions   Precautions  None      Restrictions   Weight Bearing Restrictions  No      Balance Screen   Has the patient fallen in the past 6 months  No      Prior Function   Level of Independence  Independent   gets help with heavier yard activity now     Observation/Other Assessments   Focus on Therapeutic Outcomes (FOTO)   44% limited      Posture/Postural Control   Posture Comments  mild flat back  ilia  level       ROM / Strength   AROM / PROM / Strength  AROM;PROM;Strength       AROM   AROM Assessment Site  Lumbar    Lumbar Flexion  30    Lumbar Extension  20    Lumbar - Right Side Bend  10    Lumbar - Left Side Bend  10      PROM   Overall PROM Comments  stiffness of hip rotation more with IR LT hip      Strength   Overall Strength Comments  WNL      Flexibility   Soft Tissue Assessment /Muscle Length  yes    Hamstrings  --      Palpation   Palpation comment  --      Ambulation/Gait   Gait Comments  WNL                Objective measurements completed on examination: See above findings.              PT Education - 09/11/19 1122    Education Details  POC HEP   model to  demo mechanics of stretching,  using support with pillow when sitting to decreased flexion    Person(s) Educated  Patient    Methods  Tactile cues;Verbal cues;Handout    Comprehension  Verbalized understanding;Returned demonstration       PT Short Term Goals - 09/11/19 1019      PT SHORT TERM GOAL #1   Title  He will be independent with initial hEP    Time  3    Period  Weeks    Status  New      PT SHORT TERM GOAL #2   Title  He will report back pain decreased 25% or more   with getting out of chair    Time  3    Period  Weeks    Status  New        PT Long Term Goals - 09/11/19 1020      PT LONG TERM GOAL #1   Title  He will be indpendent with all HEP issued    Period  Weeks    Status  New      PT LONG TERM GOAL #2   Title  He will report pain   decreased  50% or more  bending to brush teeth and do self care    Time  6    Period  Weeks    Status  New      PT LONG TERM GOAL #3   Title  He will be able to stand from a chair without pain.    Time  6    Period  Weeks    Status  New      PT LONG TERM GOAL #4   Title  He will return to lifting with yard work with no pain.    Time  6    Period  Weeks    Status  New      PT LONG TERM GOAL #5   Title  FOTO score with improve to  34 % limited    Time  6    Period  Weeks    Status   New             Plan - 09/11/19 1018    Clinical Impression Statement  [name] presents with chronic  intermittant LBP since Feb 2020. He has pain with flexion  and getting out of chair. He is stiff in lower back and rotation of hips.    He has only mild tenderness to palpation and PA  pressures to lumar spine and sacrum. He shoulder benefit from skilled PT and consistnet HEP    Personal Factors and Comorbidities  Age;Time since onset of injury/illness/exacerbation    Examination-Activity Limitations  Bed Mobility   getting out of chair.   Examination-Participation Restrictions  Yard Work    Stability/Clinical Decision Making  Stable/Uncomplicated    Clinical Decision Making  Low    Rehab Potential  Good    PT Frequency  2x / week    PT Duration  6 weeks    PT Treatment/Interventions  Passive range of motion;Dry needling;Therapeutic exercise;Therapeutic activities;Moist Heat;Electrical Stimulation;Iontophoresis 4mg /ml Dexamethasone;Ultrasound;Traction    PT Next Visit Plan  REveiew stretching and  add to HEP as needed. manual and modalities as needed.    PT Home Exercise Plan  LTR, KtC, pressups    Consulted and Agree with Plan of Care  Patient       Patient will benefit from skilled therapeutic intervention in order to improve the following deficits and impairments:  Pain, Postural dysfunction, Decreased strength, Decreased activity tolerance, Increased muscle spasms  Visit Diagnosis: Bilateral low back pain without sciatica, unspecified chronicity  Muscle spasm of back     Problem List Patient Active Problem List   Diagnosis Date Noted  . Melanoma ([place])   . Atrial fibrillation ([place])   . Malignant neoplasm of prostate ([place]) 07/15/2015  . Recurrent nephrolithiasis 07/15/2015  . Personal history of prostate cancer 04/25/2015  . 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 Coronary artery disease 11/02/2011  . Urethral  stricture 06/22/2011  . Hyperlipidemia   . Borderline diabetic   . Hypertension     Darrel Hoover  PT 09/11/2019, 11:32 AM  [facility] [address] [place], [place], [address] Phone: [phone]   Fax:  [phone]  Name: Antonio Clark MRN: [id number] Date of Birth: 1936/04/13

## 2019-09-12 ENCOUNTER — Other Ambulatory Visit: Payer: Self-pay | Admitting: Cardiology

## 2019-09-14 ENCOUNTER — Ambulatory Visit: Payer: Medicare Other

## 2019-09-14 ENCOUNTER — Other Ambulatory Visit: Payer: Self-pay

## 2019-09-14 DIAGNOSIS — M545 Low back pain, unspecified: Secondary | ICD-10-CM

## 2019-09-14 DIAGNOSIS — M2560 Stiffness of unspecified joint, not elsewhere classified: Secondary | ICD-10-CM | POA: Diagnosis not present

## 2019-09-14 DIAGNOSIS — M62830 Muscle spasm of back: Secondary | ICD-10-CM | POA: Diagnosis not present

## 2019-09-14 NOTE — Therapy (Signed)
[place], [place], [address] Phone: [phone]   Fax:  [phone]  Physical Therapy Treatment  Patient Details  Name: Antonio Clark MRN: [id number] Date of Birth: Jan 11, 1936 Referring Provider (PT): Jenna Luo, MD   Encounter Date: 09/14/2019  PT End of Session - 09/14/19 1329    Visit Number  2    Number of Visits  12    Date for PT Re-Evaluation  10/20/19    Authorization Type  MCR/UHC    PT Start Time  1215    PT Stop Time  1255    PT Time Calculation (min)  40 min    Activity Tolerance  Patient tolerated treatment well    Behavior During Therapy  [facility] for tasks assessed/performed       Past Medical History:  Diagnosis Date  . Arthritis   . Atrial fibrillation ([place])   . Back pain   . Borderline diabetic   . Dysrhythmia    a-fib  . GERD (gastroesophageal reflux disease)   . History of echocardiogram 07/ 07/ 2011  . History of lithotripsy 1989  . Hyperlipidemia   . Hypertension   . Melanoma ([place])    right chest wall (12/20)  . Pre-diabetes   . Prostate CA ([place]) 2010  . Sleep apnea    uses CPAP nightly    Past Surgical History:  Procedure Laterality Date  . AXILLARY SENTINEL NODE BIOPSY Right 08/23/2019   Procedure: SENTINEL LYMPH NODE BIOSPY RIGHT AXILLA;  Surgeon: Stark Klein, MD;  Location: [facility];  Service: General;  Laterality: Right;  . COLONOSCOPY  2002  . FASCIECTOMY Left 03/28/2019   Procedure: SEGMENTAL FASCIECTOMY LEFT RING FINGER;  Surgeon: Daryll Brod, MD;  Location: [facility];  Service: Orthopedics;  Laterality: Left;  ANESTHESIA  AXILLARY BLOCK  . INSERTION PROSTATE RADIATION SEED  8 4 2010   per Dr Rosana Hoes  . MELANOMA EXCISION Right 06/22/2019   Procedure: WIDE LOCAL EXCISION RIGHT CHEST WALL MELANOMA, ADVANCEMENT FLAP CLOSURE FOR DEFECT 3X6 CM;  Surgeon: Stark Klein, MD;  Location: [facility];  Service: General;   Laterality: Right;  . POLYPECTOMY  2002  . THROAT SURGERY  1980   benign cyst  . URETHRAL STRICTURE DILATATION     also penile implant    There were no vitals filed for this visit.  Subjective Assessment - 09/14/19 1229    Subjective  No pain    Currently in Pain?  No/denies                       [facility] Adult PT Treatment/Exercise - 09/14/19 0001      Exercises   Exercises  Lumbar      Lumbar Exercises: Stretches   Single Knee to Chest Stretch  Right;Left;2 reps;30 seconds    Lower Trunk Rotation  2 reps;30 seconds      Lumbar Exercises: Aerobic   Nustep  L5  LE  5 min      Lumbar Exercises: Supine   Pelvic Tilt  5 seconds;15 reps    Bridge  15 reps      Lumbar Exercises: Sidelying   Clam  Right;Left;15 reps      Manual Therapy   Manual Therapy  Joint mobilization;Soft tissue mobilization;Passive ROM    Joint Mobilization  lumbar and sacrun GR 3-4    Soft tissue mobilization  lower back     Passive ROM  Hip IR bilaterally             PT Education - 09/14/19 1327    Education Details  HEP    Person(s) Educated  Patient    Methods  Explanation;Demonstration;Tactile cues;Verbal cues;Handout    Comprehension  Returned demonstration;Verbalized understanding       PT Short Term Goals - 09/11/19 1019      PT SHORT TERM GOAL #1   Title  He will be independent with initial hEP    Time  3    Period  Weeks    Status  New      PT SHORT TERM GOAL #2   Title  He will report back pain decreased 25% or more   with getting out of chair    Time  3    Period  Weeks    Status  New        PT Long Term Goals - 09/11/19 1020      PT LONG TERM GOAL #1   Title  He will be indpendent with all HEP issued    Period  Weeks    Status  New      PT LONG TERM GOAL #2   Title  He will report pain   decreased  50% or more  bending to brush teeth and do self care    Time  6    Period  Weeks    Status  New      PT LONG TERM GOAL #3   Title  He will be  able to stand from a chair without pain.    Time  6    Period  Weeks    Status  New      PT LONG TERM GOAL #4   Title  He will return to lifting with yard work with no pain.    Time  6    Period  Weeks    Status  New      PT LONG TERM GOAL #5   Title  FOTO score with improve to  34 % limited    Time  6    Period  Weeks    Status  New            Plan - 09/14/19 1230    Clinical Impression Statement  He reported feeling better and was able to bend with less pain post session . Progressing. Added strength exer for haome    PT Treatment/Interventions  Passive range of motion;Dry needling;Therapeutic exercise;Therapeutic activities;Moist Heat;Electrical Stimulation;Iontophoresis 4mg /ml Dexamethasone;Ultrasound;Traction    PT Next Visit Plan  REveiew add to HEP as needed. manual and modalities as needed.    PT Home Exercise Plan  LTR, KtC, pressups,, bridge, clam, PPT    Consulted and Agree with Plan of Care  Patient       Patient will benefit from skilled therapeutic intervention in order to improve the following deficits and impairments:  Pain, Postural dysfunction, Decreased strength, Decreased activity tolerance, Increased muscle spasms  Visit Diagnosis: Bilateral low back pain without sciatica, unspecified chronicity  Muscle spasm of back  Joint stiffness of spine     Problem List Patient Active Problem List   Diagnosis Date Noted  . Melanoma ([place])   . Atrial fibrillation ([place])   . Malignant neoplasm of prostate ([place]) 07/15/2015  . Recurrent nephrolithiasis 07/15/2015  . Personal history of prostate cancer 04/25/2015  . 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  Coronary artery disease 11/02/2011  . Urethral stricture 06/22/2011  . Hyperlipidemia   . Borderline diabetic   . Hypertension     Darrel Hoover  PT 09/14/2019, 1:30 PM  [facility] [address] [place], [place], [address] Phone: [phone]   Fax:  [phone]  Name: Antonio Clark MRN: [id number] Date of Birth: Dec 25, 1935

## 2019-09-14 NOTE — Patient Instructions (Signed)
Bridge, side hip clam, PPT x 10-20 reps daily 1-2 sets hole 1-5 sec

## 2019-09-18 ENCOUNTER — Other Ambulatory Visit: Payer: Self-pay

## 2019-09-18 ENCOUNTER — Ambulatory Visit: Payer: Medicare Other | Admitting: Physical Therapy

## 2019-09-18 ENCOUNTER — Encounter: Payer: Self-pay | Admitting: Physical Therapy

## 2019-09-18 DIAGNOSIS — M545 Low back pain, unspecified: Secondary | ICD-10-CM

## 2019-09-18 DIAGNOSIS — M62830 Muscle spasm of back: Secondary | ICD-10-CM

## 2019-09-18 DIAGNOSIS — M2560 Stiffness of unspecified joint, not elsewhere classified: Secondary | ICD-10-CM | POA: Diagnosis not present

## 2019-09-18 NOTE — Therapy (Signed)
[place], [place], [address] Phone: [phone]   Fax:  [phone]  Physical Therapy Treatment  Patient Details  Name: Antonio Clark MRN: [id number] Date of Birth: 08-03-1936 Referring Provider (PT): Jenna Luo, MD   Encounter Date: 09/18/2019  PT End of Session - 09/18/19 0848    Visit Number  3    Number of Visits  12    Date for PT Re-Evaluation  10/20/19    Authorization Type  MCR/UHC    PT Start Time  0845    PT Stop Time  0923    PT Time Calculation (min)  38 min       Past Medical History:  Diagnosis Date  . Arthritis   . Atrial fibrillation ([place])   . Back pain   . Borderline diabetic   . Dysrhythmia    a-fib  . GERD (gastroesophageal reflux disease)   . History of echocardiogram 07/ 07/ 2011  . History of lithotripsy 1989  . Hyperlipidemia   . Hypertension   . Melanoma ([place])    right chest wall (12/20)  . Pre-diabetes   . Prostate CA ([place]) 2010  . Sleep apnea    uses CPAP nightly    Past Surgical History:  Procedure Laterality Date  . AXILLARY SENTINEL NODE BIOPSY Right 08/23/2019   Procedure: SENTINEL LYMPH NODE BIOSPY RIGHT AXILLA;  Surgeon: Stark Klein, MD;  Location: [facility];  Service: General;  Laterality: Right;  . COLONOSCOPY  2002  . FASCIECTOMY Left 03/28/2019   Procedure: SEGMENTAL FASCIECTOMY LEFT RING FINGER;  Surgeon: Daryll Brod, MD;  Location: [facility];  Service: Orthopedics;  Laterality: Left;  ANESTHESIA  AXILLARY BLOCK  . INSERTION PROSTATE RADIATION SEED  8 4 2010   per Dr Rosana Hoes  . MELANOMA EXCISION Right 06/22/2019   Procedure: WIDE LOCAL EXCISION RIGHT CHEST WALL MELANOMA, ADVANCEMENT FLAP CLOSURE FOR DEFECT 3X6 CM;  Surgeon: Stark Klein, MD;  Location: [facility];  Service: General;  Laterality: Right;  . POLYPECTOMY  2002  . THROAT SURGERY  1980   benign cyst  . URETHRAL STRICTURE DILATATION      also penile implant    There were no vitals filed for this visit.  Subjective Assessment - 09/18/19 0846    Subjective  Had a little pain when I woke up. The exercises help resolve the pain. I also have a "teeter" I can do some stretches while I am on there.    Currently in Pain?  No/denies                       [facility] Adult PT Treatment/Exercise - 09/18/19 0001      Self-Care   Self-Care  ADL's    ADL's  Instructed pt in staggered stand verses foot in cabinet while washing dishes/ rushing teeth- he reported no back pain when using this strategy       Lumbar Exercises: Stretches   Single Knee to Chest Stretch  Right;Left;2 reps;30 seconds    Lower Trunk Rotation  2 reps;30 seconds      Lumbar Exercises: Aerobic   Nustep  L5  LE  5 min      Lumbar Exercises: Seated   Sit to Stand  10 reps    Other Seated Lumbar Exercises  modified childs pose using rolling stool forward and lateral        Lumbar Exercises: Supine  Pelvic Tilt  5 seconds;15 reps    Pelvic Tilt Limitations  cues for technique and breathing     Bridge  20 reps    Bridge Limitations  cues for breathing     Straight Leg Raise  10 reps    Straight Leg Raises Limitations  cues for abdominal draw in       Lumbar Exercises: Sidelying   Clam  Right;Left;20 reps    Hip Abduction  Right;Left;10 reps               PT Short Term Goals - 09/11/19 1019      PT SHORT TERM GOAL #1   Title  He will be independent with initial hEP    Time  3    Period  Weeks    Status  New      PT SHORT TERM GOAL #2   Title  He will report back pain decreased 25% or more   with getting out of chair    Time  3    Period  Weeks    Status  New        PT Long Term Goals - 09/11/19 1020      PT LONG TERM GOAL #1   Title  He will be indpendent with all HEP issued    Period  Weeks    Status  New      PT LONG TERM GOAL #2   Title  He will report pain   decreased  50% or more  bending to brush teeth and do  self care    Time  6    Period  Weeks    Status  New      PT LONG TERM GOAL #3   Title  He will be able to stand from a chair without pain.    Time  6    Period  Weeks    Status  New      PT LONG TERM GOAL #4   Title  He will return to lifting with yard work with no pain.    Time  6    Period  Weeks    Status  New      PT LONG TERM GOAL #5   Title  FOTO score with improve to  34 % limited    Time  6    Period  Weeks    Status  New            Plan - 09/18/19 0848    Clinical Impression Statement  Pt reports pain is still present with leaning forward. progressed with core strength and reviewed HEP. Instructed him in options for lreaning at sink to prevent back pain.    PT Next Visit Plan  REveiew add to HEP as needed. manual and modalities as needed.    PT Home Exercise Plan  LTR, KtC, pressups,, bridge, clam, PPT       Patient will benefit from skilled therapeutic intervention in order to improve the following deficits and impairments:  Pain, Postural dysfunction, Decreased strength, Decreased activity tolerance, Increased muscle spasms  Visit Diagnosis: Muscle spasm of back  Bilateral low back pain without sciatica, unspecified chronicity  Joint stiffness of spine     Problem List Patient Active Problem List   Diagnosis Date Noted  . Melanoma ([place])   . Atrial fibrillation ([place])   . Malignant neoplasm of prostate ([place]) 07/15/2015  . Recurrent nephrolithiasis 07/15/2015  . Personal history of prostate  cancer 04/25/2015  . 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 Coronary artery disease 11/02/2011  . Urethral stricture 06/22/2011  . Hyperlipidemia   . Borderline diabetic   . Hypertension     Dorene Ar, [place] 09/18/2019, 9:45 AM  [facility] [address] [place], [place], [address] Phone: [phone]   Fax:  [phone]  Name: Antonio Clark MRN: [id number] Date of Birth: 10-27-1935

## 2019-09-20 ENCOUNTER — Other Ambulatory Visit: Payer: Self-pay

## 2019-09-20 ENCOUNTER — Encounter: Payer: Self-pay | Admitting: Physical Therapy

## 2019-09-20 ENCOUNTER — Ambulatory Visit: Payer: Medicare Other | Admitting: Physical Therapy

## 2019-09-20 DIAGNOSIS — M2560 Stiffness of unspecified joint, not elsewhere classified: Secondary | ICD-10-CM | POA: Diagnosis not present

## 2019-09-20 DIAGNOSIS — M62830 Muscle spasm of back: Secondary | ICD-10-CM

## 2019-09-20 DIAGNOSIS — M545 Low back pain, unspecified: Secondary | ICD-10-CM

## 2019-09-20 NOTE — Therapy (Signed)
[place], [place], [address] Phone: [phone]   Fax:  [phone]  Physical Therapy Treatment  Patient Details  Name: Antonio Clark MRN: [id number] Date of Birth: [birth year] Referring Provider (PT): Jenna Luo, MD   Encounter Date: 09/20/2019  PT End of Session - 09/20/19 1016    Visit Number  4    Number of Visits  12    Date for PT Re-Evaluation  10/20/19    Authorization Type  MCR/UHC    PT Start Time  1010    PT Stop Time  1048    PT Time Calculation (min)  38 min       Past Medical History:  Diagnosis Date  . Arthritis   . Atrial fibrillation ([place])   . Back pain   . Borderline diabetic   . Dysrhythmia    a-fib  . GERD (gastroesophageal reflux disease)   . History of echocardiogram 07/ 07/ 2011  . History of lithotripsy 1989  . Hyperlipidemia   . Hypertension   . Melanoma ([place])    right chest wall (12/20)  . Pre-diabetes   . Prostate CA ([place]) 2010  . Sleep apnea    uses CPAP nightly    Past Surgical History:  Procedure Laterality Date  . AXILLARY SENTINEL NODE BIOPSY Right 08/23/2019   Procedure: SENTINEL LYMPH NODE BIOSPY RIGHT AXILLA;  Surgeon: Stark Klein, MD;  Location: [facility];  Service: General;  Laterality: Right;  . COLONOSCOPY  2002  . FASCIECTOMY Left 03/28/2019   Procedure: SEGMENTAL FASCIECTOMY LEFT RING FINGER;  Surgeon: Daryll Brod, MD;  Location: [facility];  Service: Orthopedics;  Laterality: Left;  ANESTHESIA  AXILLARY BLOCK  . INSERTION PROSTATE RADIATION SEED  8 4 2010   per Dr Rosana Hoes  . MELANOMA EXCISION Right 06/22/2019   Procedure: WIDE LOCAL EXCISION RIGHT CHEST WALL MELANOMA, ADVANCEMENT FLAP CLOSURE FOR DEFECT 3X6 CM;  Surgeon: Stark Klein, MD;  Location: [facility];  Service: General;  Laterality: Right;  . POLYPECTOMY  2002  . THROAT SURGERY  1980   benign cyst  . URETHRAL STRICTURE DILATATION      also penile implant    There were no vitals filed for this visit.  Subjective Assessment - 09/20/19 1012    Subjective  I walked 2 miles yesterday and I did my exercises. The body mechanics tips help to relieve pain with bending at the sink. Having less pai when first rise in the morning.    Currently in Pain?  No/denies                       [facility] Adult PT Treatment/Exercise - 09/20/19 0001      Lumbar Exercises: Stretches   Single Knee to Chest Stretch  Right;Left;2 reps;30 seconds    Lower Trunk Rotation  2 reps;30 seconds      Lumbar Exercises: Aerobic   Nustep  L5  LE  5 min      Lumbar Exercises: Standing   Row  20 reps    Theraband Level (Row)  Level 3 (Green)    Shoulder Extension  20 reps    Theraband Level (Shoulder Extension)  Level 3 (Green)    Other Standing Lumbar Exercises  sit-stand x 10, hip hinge x 10 with cues for alignment given     Other Standing Lumbar Exercises  Diagonal pull down and across green band x 15 each  Lumbar Exercises: Seated   Other Seated Lumbar Exercises  modified childs pose using physioball forward and lateral        Lumbar Exercises: Supine   Bridge  20 reps    Bridge Limitations  cues for breathing       Lumbar Exercises: Sidelying   Clam  Right;Left;20 reps    Hip Abduction  Right;Left;10 reps   2 sets              PT Short Term Goals - 09/11/19 1019      PT SHORT TERM GOAL #1   Title  He will be independent with initial hEP    Time  3    Period  Weeks    Status  New      PT SHORT TERM GOAL #2   Title  He will report back pain decreased 25% or more   with getting out of chair    Time  3    Period  Weeks    Status  New        PT Long Term Goals - 09/11/19 1020      PT LONG TERM GOAL #1   Title  He will be indpendent with all HEP issued    Period  Weeks    Status  New      PT LONG TERM GOAL #2   Title  He will report pain   decreased  50% or more  bending to brush teeth and do self  care    Time  6    Period  Weeks    Status  New      PT LONG TERM GOAL #3   Title  He will be able to stand from a chair without pain.    Time  6    Period  Weeks    Status  New      PT LONG TERM GOAL #4   Title  He will return to lifting with yard work with no pain.    Time  6    Period  Weeks    Status  New      PT LONG TERM GOAL #5   Title  FOTO score with improve to  34 % limited    Time  6    Period  Weeks    Status  New            Plan - 09/20/19 1053    Clinical Impression Statement  pt reprots body mechanics adjustments have been helpful for leaning at sink. Continued with hip hinge education posterior chain strenthening. Encouraged him to be mindful of mechanics with bending and transitions. He had no pain at end of session.    PT Next Visit Plan  assess toletance to strengthening this session, possible update HEP    PT Home Exercise Plan  LTR, KtC, pressups,, bridge, clam, PPT       Patient will benefit from skilled therapeutic intervention in order to improve the following deficits and impairments:  Pain, Postural dysfunction, Decreased strength, Decreased activity tolerance, Increased muscle spasms  Visit Diagnosis: Muscle spasm of back  Bilateral low back pain without sciatica, unspecified chronicity  Joint stiffness of spine     Problem List Patient Active Problem List   Diagnosis Date Noted  . Melanoma ([place])   . Atrial fibrillation ([place])   . Malignant neoplasm of prostate ([place]) 07/15/2015  . Recurrent nephrolithiasis 07/15/2015  . Personal history of prostate cancer 04/25/2015  .  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 Coronary artery disease 11/02/2011  . Urethral stricture 06/22/2011  . Hyperlipidemia   . Borderline diabetic   . Hypertension     Dorene Ar, [place] 09/20/2019, 10:55 AM  [facility] [address] [place], [place], [address] Phone: [phone]   Fax:  [phone]  Name: Antonio Clark MRN: [id number] Date of Birth: 05/31/1936

## 2019-09-21 ENCOUNTER — Ambulatory Visit: Payer: Medicare Other | Attending: Internal Medicine

## 2019-09-21 DIAGNOSIS — Z23 Encounter for immunization: Secondary | ICD-10-CM

## 2019-09-21 NOTE — Progress Notes (Signed)
   Covid-19 Vaccination Clinic  Name:  Antonio Clark    MRN: [id number] DOB: Aug 02, 1936  09/21/2019  Mr. Antonio Clark was observed post Covid-19 immunization for 15 minutes without incidence. He was provided with Vaccine Information Sheet and instruction to access the V-Safe system.   Mr. Antonio Clark was instructed to call 911 with any severe reactions post vaccine: Marland Kitchen Difficulty breathing  . Swelling of your face and throat  . A fast heartbeat  . A bad rash all over your body  . Dizziness and weakness    Immunizations Administered    Name Date Dose VIS Date Route   Pfizer COVID-19 Vaccine 09/21/2019  8:05 AM 0.3 mL 07/21/2019 Intramuscular   Manufacturer: [place]   Lot: [id number]   [place]: [id number]

## 2019-09-25 ENCOUNTER — Ambulatory Visit: Payer: Medicare Other

## 2019-09-28 ENCOUNTER — Ambulatory Visit: Payer: Medicare Other

## 2019-09-28 NOTE — Progress Notes (Signed)
CARDIOLOGY OFFICE NOTE  Date:  10/02/2019    Antonio Clark Date of Birth: 1936/07/27 Medical Record [id number]  PCP:  Antonio Frizzle, MD  Cardiologist:  Antonio Clark  Chief Complaint  Patient presents with  . Follow-up    History of Present Illness: Antonio Clark is a 84 y.o. male who presents today for a follow up visit. Seen for Dr. Marlou Clark. Formerly seen by Dr. Webb Clark well as Dr. Susa Clark.   I previously took care of both his brother and his sister in law(Antonio and Antonio Clark).  He has a history of HTN and HLD. Strong FH for CAD. Remote Cardiolite in 2013 was normal.   Seen back in Mayof 2019 by Dr. Marlou Clark- had had AF while in the Urology office. Found to have OSA - but CPAP made him claustrophobic. Placed on Eliquis- changed to Pradaxa due to insurance coverage.  I have followed him over the past year or so - we have increased his HCTZ for better BP control - last seen in November - was doing ok - still loves too much salt. He was back exercising.   Phone call last month about needing lymph node biopsy.   The patient does not have symptoms concerning for COVID-19 infection (fever, chills, cough, or new shortness of breath).   Comes in today. Here alone. He is doing well. No chest pain. Breahting is good. More issues with his back. Getting some therapy for his back - this has helped some and he is trying to keep walking. He wants to find out what is wrong with his back - discussing with PCP.  He has had both COVID vaccines. Tolerating his Pradaxa. No falls. [facility] and enjoying that.    Past Medical History:  Diagnosis Date  . Arthritis   . Atrial fibrillation ([place])   . Back pain   . Borderline diabetic   . Dysrhythmia    a-fib  . GERD (gastroesophageal reflux disease)   . History of echocardiogram 07/ 07/ 2011  . History of lithotripsy 1989  . Hyperlipidemia   . Hypertension   . Melanoma ([place])    right chest wall  (12/20)  . Pre-diabetes   . Prostate CA ([place]) 2010  . Sleep apnea    uses CPAP nightly    Past Surgical History:  Procedure Laterality Date  . AXILLARY SENTINEL NODE BIOPSY Right 08/23/2019   Procedure: SENTINEL LYMPH NODE BIOSPY RIGHT AXILLA;  Surgeon: Antonio Klein, MD;  Location: [facility];  Service: General;  Laterality: Right;  . COLONOSCOPY  2002  . FASCIECTOMY Left 03/28/2019   Procedure: SEGMENTAL FASCIECTOMY LEFT RING FINGER;  Surgeon: Antonio Brod, MD;  Location: [facility];  Service: Orthopedics;  Laterality: Left;  ANESTHESIA  AXILLARY BLOCK  . INSERTION PROSTATE RADIATION SEED  8 4 2010   per Dr Antonio Clark  . MELANOMA EXCISION Right 06/22/2019   Procedure: WIDE LOCAL EXCISION RIGHT CHEST WALL MELANOMA, ADVANCEMENT FLAP CLOSURE FOR DEFECT 3X6 CM;  Surgeon: Antonio Klein, MD;  Location: [facility];  Service: General;  Laterality: Right;  . POLYPECTOMY  2002  . THROAT SURGERY  1980   benign cyst  . URETHRAL STRICTURE DILATATION     also penile implant     Medications: Current Meds  Medication Sig  . atorvastatin (LIPITOR) 40 MG tablet Take 1 tablet (40 mg total) by mouth daily. Please stop pravastatin  .  clobetasol ointment (TEMOVATE) [id number] % Apply 1 application topically as needed (skin irritation).   . hydrocortisone (ANUSOL-HC) 2.5 % rectal cream Apply 1 application topically 2 (two) times daily as needed.  . multivitamin (THERAGRAN) per tablet Take 1 tablet by mouth daily.    . Omega-3 Fatty Acids (FISH OIL) 1200 MG CAPS Take 1,200 mg by mouth daily.    Marland Kitchen omeprazole (PRILOSEC) 20 MG capsule Take 20 mg by mouth as needed (heartburn).  Marland Kitchen PRADAXA 150 MG CAPS capsule TAKE 1 CAPSULE BY MOUTH TWICE A DAY  . pravastatin (PRAVACHOL) 40 MG tablet Take 1 tablet (40 mg total) by mouth daily. Please keep upcoming appt for future refills. Thank you  . Tamsulosin HCl (FLOMAX) 0.4 MG CAPS Take 0.4 mg by mouth daily.    . traMADol (ULTRAM) 50  MG tablet Take 1 tablet (50 mg total) by mouth every 8 (eight) hours as needed for moderate pain or severe pain.  . valsartan (DIOVAN) 320 MG tablet TAKE 1 TABLET BY MOUTH EVERY DAY     Allergies: No Known Allergies  Social History: The patient  reports that he has never smoked. He has never used smokeless tobacco. He reports current alcohol use. He reports that he does not use drugs.   Family History: The patient's family history includes Arrhythmia in his brother; Cancer in his mother; Colon cancer (age of onset: 50) in his mother; Coronary artery disease in his brother; Diabetes in his brother and brother; Hearing loss in his brother; Heart disease in his brother and father; Heart failure in his brother; Hyperlipidemia in his sister; Hypertension in his brother, brother, brother, and father; Stroke in his mother and sister; Sudden death in his father; Suicidality in his sister.   Review of Systems: Please see the history of present illness.   All other systems are reviewed and negative.   Physical Exam: VS:  BP 124/80   Pulse 79   Ht 5\' 9"  (1.753 m)   Wt 197 lb 12.8 oz (89.7 kg)   SpO2 100%   BMI 29.21 kg/m  .  BMI Body mass index is 29.21 kg/m.  Wt Readings from Last 3 Encounters:  10/02/19 197 lb 12.8 oz (89.7 kg)  08/23/19 194 lb 7.1 oz (88.2 kg)  08/17/19 196 lb (88.9 kg)    General: Pleasant. He looks younger than his stated age.  Cardiac: Regular rate and rhythm today. No murmurs, rubs, or gallops. No edema.  Respiratory:  Lungs are clear to auscultation bilaterally with normal work of breathing.  GI: Soft and nontender.  MS: No deformity or atrophy. Gait and ROM intact.  Skin: Warm and dry. Color is normal.  Neuro:  Strength and sensation are intact and no gross focal deficits noted.  Psych: Alert, appropriate and with normal affect.   LABORATORY DATA:  EKG:  EKG is not ordered today.   Lab Results  Component Value Date   WBC 5.4 08/14/2019   HGB 13.6  08/14/2019   HCT 40.6 08/14/2019   PLT 140 08/14/2019   GLUCOSE 159 (H) 08/18/2019   CHOL 167 08/14/2019   TRIG 184 (H) 08/14/2019   HDL 45 08/14/2019   LDLCALC 94 08/14/2019   ALT 11 08/14/2019   AST 15 08/14/2019   NA 138 08/18/2019   K 4.6 08/18/2019   CL 105 08/18/2019   CREATININE 1.12 08/18/2019   BUN 23 08/18/2019   CO2 24 08/18/2019   INR 1.0 03/06/2009   HGBA1C 7.0 (H) 08/14/2019  BNP (last 3 results) No results for input(s): BNP in the last 8760 hours.  ProBNP (last 3 results) No results for input(s): PROBNP in the last 8760 hours.   Other Studies Reviewed Today:  Echocardiogram 10/2017 - Left ventricle: The cavity size was normal. There was mild focal basal hypertrophy of the septum. Systolic function was normal. The estimated ejection fraction was in the range of 50% to 55%. Wall motion was normal; there were no regional wall motion abnormalities. Features are consistent with a pseudonormal left ventricular filling pattern, with concomitant abnormal relaxation and increased filling pressure (grade 2 diastolic dysfunction). - Aortic valve: Trileaflet; moderately thickened, mildly calcified leaflets. There was mild regurgitation. Regurgitation pressure half-time: 701 ms. - Aorta: Aortic root dimension: 38 mm (ED). - Aortic root: The aortic root was mildly dilated. - Mitral valve: Calcified annulus. There was trivial regurgitation. - Left atrium: The atrium was moderately dilated. - Right ventricle: The cavity size was mildly dilated. Wall thickness was normal. - Atrial septum: There was increased thickness of the septum, consistent with lipomatous hypertrophy. - Tricuspid valve: There was trivial regurgitation. - Pulmonic valve: There was trivial regurgitation.    ASSESSMENT & PLAN:   1. HTN- BP looks great - will continue with his HCTZ at current dose - he is doing much better with salt restriction. He is to let me know if  he has any dizzy spells.   2. PAF - in sinus by exam today.   3. Chronic anticoagulation - no problems noted.   4. OSA - on CPAP  5. DM - most recent A1C is noted - he is trying to work on this.   6. COVID-19 Education: The signs and symptoms of COVID-19 were discussed with the patient and how to seek care for testing (follow up with PCP or arrange E-visit).  The importance of social distancing, staying at home, hand hygiene and wearing a mask when out in public were discussed today. He has had both COVID vaccines.   Current medicines are reviewed with the patient today.  The patient does not have concerns regarding medicines other than what has been noted above.  The following changes have been made:  See above.  Labs/ tests ordered today include:   No orders of the defined types were placed in this encounter.    Disposition:   FU with me in about 4 months.   Patient is agreeable to this plan and will call if any problems develop in the interim.   SignedTruitt Merle, NP  10/02/2019 10:45 AM  [facility] [address] [place], [place]  [address] Phone: [phone] Fax: [phone]

## 2019-10-02 ENCOUNTER — Encounter: Payer: Self-pay | Admitting: Nurse Practitioner

## 2019-10-02 ENCOUNTER — Other Ambulatory Visit: Payer: Self-pay

## 2019-10-02 ENCOUNTER — Ambulatory Visit (INDEPENDENT_AMBULATORY_CARE_PROVIDER_SITE_OTHER): Payer: Medicare Other | Admitting: Nurse Practitioner

## 2019-10-02 VITALS — BP 124/80 | HR 79 | Ht 69.0 in | Wt 197.8 lb

## 2019-10-02 DIAGNOSIS — I10 Essential (primary) hypertension: Secondary | ICD-10-CM | POA: Diagnosis not present

## 2019-10-02 DIAGNOSIS — E7800 Pure hypercholesterolemia, unspecified: Secondary | ICD-10-CM | POA: Diagnosis not present

## 2019-10-02 DIAGNOSIS — I480 Paroxysmal atrial fibrillation: Secondary | ICD-10-CM | POA: Diagnosis not present

## 2019-10-02 DIAGNOSIS — Z7189 Other specified counseling: Secondary | ICD-10-CM | POA: Diagnosis not present

## 2019-10-02 NOTE — Patient Instructions (Addendum)
After Visit Summary:  We will be checking the following labs today - NONE   Medication Instructions:    Continue with your current medicines.    If you need a refill on your cardiac medications before your next appointment, please call your pharmacy.     Testing/Procedures To Be Arranged:  N/A  Follow-Up:   See me in 4 months    At [facility], you and your health needs are our priority.  As part of our continuing mission to provide you with exceptional heart care, we have created designated Provider Care Teams.  These Care Teams include your primary Cardiologist (physician) and Advanced Practice Providers (APPs -  Physician Assistants and Nurse Practitioners) who all work together to provide you with the care you need, when you need it.  Special Instructions:  . Stay safe, stay home, wash your hands for at least 20 seconds and wear a mask when out in public.  . It was good to talk with you today.  Marland Kitchen Keep a check on your BP for me - call me if the top number is consistently below 100 - or if you start having any lightheaded/dizzy spells.  Marland Kitchen Keep up the good job with restricting your salt.    Call the [place] office at [phone] if you have any questions, problems or concerns.

## 2019-10-03 ENCOUNTER — Ambulatory Visit: Payer: Medicare Other

## 2019-10-03 DIAGNOSIS — M545 Low back pain, unspecified: Secondary | ICD-10-CM

## 2019-10-03 DIAGNOSIS — M62830 Muscle spasm of back: Secondary | ICD-10-CM

## 2019-10-03 DIAGNOSIS — M2560 Stiffness of unspecified joint, not elsewhere classified: Secondary | ICD-10-CM

## 2019-10-03 NOTE — Therapy (Signed)
[place], [place], [address] Phone: [phone]   Fax:  [phone]  Physical Therapy Treatment  Patient Details  Name: Antonio Clark MRN: [id number] Date of Birth: 1936/02/17 Referring Provider (PT): Jenna Luo, MD   Encounter Date: 10/03/2019  PT End of Session - 10/03/19 1136    Visit Number  5    Number of Visits  12    Date for PT Re-Evaluation  10/20/19    Authorization Type  MCR/UHC    PT Start Time  1694    PT Stop Time  1130    PT Time Calculation (min)  45 min    Activity Tolerance  Patient tolerated treatment well;No increased pain    Behavior During Therapy  WFL for tasks assessed/performed       Past Medical History:  Diagnosis Date  . Arthritis   . Atrial fibrillation ([place])   . Back pain   . Borderline diabetic   . Dysrhythmia    a-fib  . GERD (gastroesophageal reflux disease)   . History of echocardiogram 07/ 07/ 2011  . History of lithotripsy 1989  . Hyperlipidemia   . Hypertension   . Melanoma ([place])    right chest wall (12/20)  . Pre-diabetes   . Prostate CA ([place]) 2010  . Sleep apnea    uses CPAP nightly    Past Surgical History:  Procedure Laterality Date  . AXILLARY SENTINEL NODE BIOPSY Right 08/23/2019   Procedure: SENTINEL LYMPH NODE BIOSPY RIGHT AXILLA;  Surgeon: Stark Klein, MD;  Location: [facility];  Service: General;  Laterality: Right;  . COLONOSCOPY  2002  . FASCIECTOMY Left 03/28/2019   Procedure: SEGMENTAL FASCIECTOMY LEFT RING FINGER;  Surgeon: Daryll Brod, MD;  Location: [facility];  Service: Orthopedics;  Laterality: Left;  ANESTHESIA  AXILLARY BLOCK  . INSERTION PROSTATE RADIATION SEED  8 4 2010   per Dr Rosana Hoes  . MELANOMA EXCISION Right 06/22/2019   Procedure: WIDE LOCAL EXCISION RIGHT CHEST WALL MELANOMA, ADVANCEMENT FLAP CLOSURE FOR DEFECT 3X6 CM;  Surgeon: Stark Klein, MD;  Location: [facility];   Service: General;  Laterality: Right;  . POLYPECTOMY  2002  . THROAT SURGERY  1980   benign cyst  . URETHRAL STRICTURE DILATATION     also penile implant    There were no vitals filed for this visit.  Subjective Assessment - 10/03/19 1059    Subjective  RT SI pain  back doing better with good mechanics. RT SI areas started recently.  no injury    Pain Score  3     Pain Location  Pelvis    Pain Orientation  Right   SI area   Pain Descriptors / Indicators  Aching    Pain Type  Chronic pain    Pain Onset  More than a month ago    Pain Frequency  Intermittent    Aggravating Factors   bending.    Pain Relieving Factors  stand erect.                       [facility] Adult PT Treatment/Exercise - 10/03/19 0001      Lumbar Exercises: Aerobic   Nustep  L5  LE  6 min      Manual Therapy   Joint Mobilization  Sacral PA glides LT     Passive ROM  long axis traction to RT leg , Hip rotation stretching,  siometric hip adduction and abduction.    MET for Lt ant pelvis.  Stretch for 2-5 min             PT Education - 10/03/19 1136    Education Details  discussed pelvis and asymetry and how this may or may notr cause pain.       PT Short Term Goals - 10/03/19 1138      PT SHORT TERM GOAL #1   Title  He will be independent with initial hEP    Status  Achieved      PT SHORT TERM GOAL #2   Title  He will report back pain decreased 25% or more   with getting out of chair    Baseline  with good nmechanics    Status  Achieved        PT Long Term Goals - 09/11/19 1020      PT LONG TERM GOAL #1   Title  He will be indpendent with all HEP issued    Period  Weeks    Status  New      PT LONG TERM GOAL #2   Title  He will report pain   decreased  50% or more  bending to brush teeth and do self care    Time  6    Period  Weeks    Status  New      PT LONG TERM GOAL #3   Title  He will be able to stand from a chair without pain.    Time  6    Period  Weeks     Status  New      PT LONG TERM GOAL #4   Title  He will return to lifting with yard work with no pain.    Time  6    Period  Weeks    Status  New      PT LONG TERM GOAL #5   Title  FOTO score with improve to  34 % limited    Time  6    Period  Weeks    Status  New            Plan - 10/03/19 1137    Clinical Impression Statement  AFter stretching pelvis and Leg more symetrical and less pain but not resolved. Asked to ice at home 1-2x/day.    Continue manual and exercise    PT Treatment/Interventions  Passive range of motion;Dry needling;Therapeutic exercise;Therapeutic activities;Moist Heat;Electrical Stimulation;Iontophoresis 34m/ml Dexamethasone;Ultrasound;Traction    PT Next Visit Plan  core strength  and lifting, assess sacrum  and SI Manual if needed    PT Home Exercise Plan  LTR, KtC, pressups,, bridge, clam, PPT    Consulted and Agree with Plan of Care  Patient       Patient will benefit from skilled therapeutic intervention in order to improve the following deficits and impairments:  Pain, Postural dysfunction, Decreased strength, Decreased activity tolerance, Increased muscle spasms  Visit Diagnosis: Muscle spasm of back  Bilateral low back pain without sciatica, unspecified chronicity  Joint stiffness of spine     Problem List Patient Active Problem List   Diagnosis Date Noted  . [place]anoma ([place]   . Atrial fibrillation ([facility]   . Malignant neoplasm of prostate ([facility] 07/15/2015  . Recurrent nephrolithiasis 07/15/2015  . Personal history of prostate cancer 04/25/2015  . 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 Coronary artery disease 11/02/2011  .  Urethral stricture 06/22/2011  . Hyperlipidemia   . Borderline diabetic   . Hypertension     Darrel Hoover  PT 10/03/2019, 11:42 AM  [facility] [address] [place], [place], [address] Phone:  [phone]   Fax:  [phone]  Name: Antonio Clark MRN: [id number] Date of Birth: [birth year]

## 2019-10-05 ENCOUNTER — Other Ambulatory Visit: Payer: Self-pay

## 2019-10-05 ENCOUNTER — Ambulatory Visit: Payer: Medicare Other

## 2019-10-05 DIAGNOSIS — M62830 Muscle spasm of back: Secondary | ICD-10-CM | POA: Diagnosis not present

## 2019-10-05 DIAGNOSIS — M545 Low back pain, unspecified: Secondary | ICD-10-CM

## 2019-10-05 DIAGNOSIS — M2560 Stiffness of unspecified joint, not elsewhere classified: Secondary | ICD-10-CM | POA: Diagnosis not present

## 2019-10-05 NOTE — Therapy (Signed)
[place], [place], [address] Phone: [phone]   Fax:  [phone]  Physical Therapy Treatment  Patient Details  Name: Antonio Clark MRN: [id number] Date of Birth: March 28, 1936 Referring Provider (PT): Jenna Luo, MD   Encounter Date: 10/05/2019  PT End of Session - 10/05/19 1051    Visit Number  6    Number of Visits  12    Date for PT Re-Evaluation  10/20/19    Authorization Type  MCR/UHC    PT Start Time  1048    PT Stop Time  1130    PT Time Calculation (min)  42 min    Activity Tolerance  Patient tolerated treatment well;No increased pain    Behavior During Therapy  WFL for tasks assessed/performed       Past Medical History:  Diagnosis Date  . Arthritis   . Atrial fibrillation ([place])   . Back pain   . Borderline diabetic   . Dysrhythmia    a-fib  . GERD (gastroesophageal reflux disease)   . History of echocardiogram 07/ 07/ 2011  . History of lithotripsy 1989  . Hyperlipidemia   . Hypertension   . Melanoma ([place])    right chest wall (12/20)  . Pre-diabetes   . Prostate CA ([place]) 2010  . Sleep apnea    uses CPAP nightly    Past Surgical History:  Procedure Laterality Date  . AXILLARY SENTINEL NODE BIOPSY Right 08/23/2019   Procedure: SENTINEL LYMPH NODE BIOSPY RIGHT AXILLA;  Surgeon: Stark Klein, MD;  Location: [facility];  Service: General;  Laterality: Right;  . COLONOSCOPY  2002  . FASCIECTOMY Left 03/28/2019   Procedure: SEGMENTAL FASCIECTOMY LEFT RING FINGER;  Surgeon: Daryll Brod, MD;  Location: [facility];  Service: Orthopedics;  Laterality: Left;  ANESTHESIA  AXILLARY BLOCK  . INSERTION PROSTATE RADIATION SEED  8 4 2010   per Dr Rosana Hoes  . MELANOMA EXCISION Right 06/22/2019   Procedure: WIDE LOCAL EXCISION RIGHT CHEST WALL MELANOMA, ADVANCEMENT FLAP CLOSURE FOR DEFECT 3X6 CM;  Surgeon: Stark Klein, MD;  Location: [facility];   Service: General;  Laterality: Right;  . POLYPECTOMY  2002  . THROAT SURGERY  1980   benign cyst  . URETHRAL STRICTURE DILATATION     also penile implant    There were no vitals filed for this visit.  Subjective Assessment - 10/05/19 1051    Subjective  Spot still there but no bad today    Pain Score  2     Pain Location  --   SI   Pain Orientation  Right                       [facility] Adult PT Treatment/Exercise - 10/05/19 0001      Lumbar Exercises: Aerobic   Nustep  L5  LE  6 min      Manual Therapy   Joint Mobilization  Sacral PA glides LT and RT Lumbar PA RT and LT lower lumbar     Passive ROM  long axis traction to RT leg , Hip rotation stretching,  siometric hip adduction and abduction.    MET for Lt ant pelvis.  Stretch for 2-5 min             PT Education - 10/05/19 1133    Education Details  With model discussed which structures may contribute to his pain.  Person(s) Educated  Patient    Methods  Explanation    Comprehension  Verbalized understanding       PT Short Term Goals - 10/03/19 1138      PT SHORT TERM GOAL #1   Title  He will be independent with initial hEP    Status  Achieved      PT SHORT TERM GOAL #2   Title  He will report back pain decreased 25% or more   with getting out of chair    Baseline  with good nmechanics    Status  Achieved        PT Long Term Goals - 09/11/19 1020      PT LONG TERM GOAL #1   Title  He will be indpendent with all HEP issued    Period  Weeks    Status  New      PT LONG TERM GOAL #2   Title  He will report pain   decreased  50% or more  bending to brush teeth and do self care    Time  6    Period  Weeks    Status  New      PT LONG TERM GOAL #3   Title  He will be able to stand from a chair without pain.    Time  6    Period  Weeks    Status  New      PT LONG TERM GOAL #4   Title  He will return to lifting with yard work with no pain.    Time  6    Period  Weeks    Status   New      PT LONG TERM GOAL #5   Title  FOTO score with improve to  34 % limited    Time  6    Period  Weeks    Status  New            Plan - 10/05/19 1052    Clinical Impression Statement  Some better but the pain remains RT SI area. Traction eases pain . Will continue x 2-4 visits  and if not bette rreturn to MD.    PT Treatment/Interventions  Passive range of motion;Dry needling;Therapeutic exercise;Therapeutic activities;Moist Heat;Electrical Stimulation;Iontophoresis 26m/ml Dexamethasone;Ultrasound;Traction    PT Next Visit Plan  core strength  and lifting, assess sacrum  and SI Manual if needed    PT Home Exercise Plan  LTR, KtC, pressups,, bridge, clam, PPT    Consulted and Agree with Plan of Care  Patient       Patient will benefit from skilled therapeutic intervention in order to improve the following deficits and impairments:  Pain, Postural dysfunction, Decreased strength, Decreased activity tolerance, Increased muscle spasms  Visit Diagnosis: Bilateral low back pain without sciatica, unspecified chronicity  Muscle spasm of back  Joint stiffness of spine     Problem List Patient Active Problem List   Diagnosis Date Noted  . Melanoma ([place]   . At[place] fibrillation ([place]   . Malignant neoplasm of prostate ([facility] 07/15/2015  . Recurrent nephrolithiasis 07/15/2015  . Personal history of prostate cancer 04/25/2015  . 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 Coronary artery disease 11/02/2011  . Urethral stricture 06/22/2011  . Hyperlipidemia   . Borderline diabetic   . Hypertension     CDarrel Hoover PT 10/05/2019, 12:06 PM  [phone]  [place], [place], [address] Phone: [phone]   Fax:  [phone]  Name: Antonio Clark MRN: [id number] Date of Birth: February 17, 1936

## 2019-10-17 ENCOUNTER — Ambulatory Visit: Payer: Medicare Other | Attending: Family Medicine

## 2019-10-17 ENCOUNTER — Other Ambulatory Visit: Payer: Self-pay

## 2019-10-17 DIAGNOSIS — M545 Low back pain, unspecified: Secondary | ICD-10-CM

## 2019-10-17 DIAGNOSIS — M2560 Stiffness of unspecified joint, not elsewhere classified: Secondary | ICD-10-CM | POA: Diagnosis not present

## 2019-10-17 DIAGNOSIS — M62830 Muscle spasm of back: Secondary | ICD-10-CM | POA: Diagnosis not present

## 2019-10-17 NOTE — Therapy (Signed)
[place], [place], [address] Phone: [phone]   Fax:  [phone]  Physical Therapy Treatment  Patient Details  Name: Antonio Clark MRN: [id number] Date of Birth: 02-Nov-1935 Referring Provider (PT): Jenna Luo, MD   Encounter Date: 10/17/2019  PT End of Session - 10/17/19 1049    Visit Number  7    Number of Visits  12    Date for PT Re-Evaluation  10/20/19    Authorization Type  MCR/UHC    PT Start Time  1048    PT Stop Time  1130    PT Time Calculation (min)  42 min    Activity Tolerance  Patient tolerated treatment well;No increased pain    Behavior During Therapy  WFL for tasks assessed/performed       Past Medical History:  Diagnosis Date  . Arthritis   . Atrial fibrillation ([place])   . Back pain   . Borderline diabetic   . Dysrhythmia    a-fib  . GERD (gastroesophageal reflux disease)   . History of echocardiogram 07/ 07/ 2011  . History of lithotripsy 1989  . Hyperlipidemia   . Hypertension   . Melanoma ([place])    right chest wall (12/20)  . Pre-diabetes   . Prostate CA ([place]) 2010  . Sleep apnea    uses CPAP nightly    Past Surgical History:  Procedure Laterality Date  . AXILLARY SENTINEL NODE BIOPSY Right 08/23/2019   Procedure: SENTINEL LYMPH NODE BIOSPY RIGHT AXILLA;  Surgeon: Stark Klein, MD;  Location: [facility];  Service: General;  Laterality: Right;  . COLONOSCOPY  2002  . FASCIECTOMY Left 03/28/2019   Procedure: SEGMENTAL FASCIECTOMY LEFT RING FINGER;  Surgeon: Daryll Brod, MD;  Location: [facility];  Service: Orthopedics;  Laterality: Left;  ANESTHESIA  AXILLARY BLOCK  . INSERTION PROSTATE RADIATION SEED  8 4 2010   per Dr Rosana Hoes  . MELANOMA EXCISION Right 06/22/2019   Procedure: WIDE LOCAL EXCISION RIGHT CHEST WALL MELANOMA, ADVANCEMENT FLAP CLOSURE FOR DEFECT 3X6 CM;  Surgeon: Stark Klein, MD;  Location: [facility];   Service: General;  Laterality: Right;  . POLYPECTOMY  2002  . THROAT SURGERY  1980   benign cyst  . URETHRAL STRICTURE DILATATION     also penile implant    There were no vitals filed for this visit.  Subjective Assessment - 10/17/19 1128    Subjective  Spot still there but not bad today. Cleaned yard along road to prep for fert. no increased pain post so far today    Pain Score  1     Pain Location  --   RT SI PSIS   Pain Orientation  Right    Pain Descriptors / Indicators  Aching    Pain Type  Chronic pain    Pain Onset  More than a month ago    Pain Frequency  Intermittent    Aggravating Factors   bedning    Pain Relieving Factors  stand up                       [facility] Adult PT Treatment/Exercise - 10/17/19 0001      Lumbar Exercises: Aerobic   Nustep  L5  LE  6 min      Lumbar Exercises: Supine   Other Supine Lumbar Exercises  Feet in 2 inch block, ball between  feet red band around knees,  PPT pelvic rot so LT knee posterior to RT,  RT hip clam with 10 sec hold.  multiple cueing for technique.       Manual Therapy   Joint Mobilization  Sacral PA glides LT and RT Lumbar PA RT and LT lower lumbar  and with RT hip IR stretching             PT Education - 10/17/19 1124    Education Details  HEP    Person(s) Educated  Patient    Methods  Explanation;Demonstration;Tactile cues;Verbal cues;Handout    Comprehension  Returned demonstration;Verbalized understanding       PT Short Term Goals - 10/03/19 1138      PT SHORT TERM GOAL #1   Title  He will be independent with initial hEP    Status  Achieved      PT SHORT TERM GOAL #2   Title  He will report back pain decreased 25% or more   with getting out of chair    Baseline  with good nmechanics    Status  Achieved        PT Long Term Goals - 10/17/19 1125      PT LONG TERM GOAL #1   Title  He will be indpendent with all HEP issued    Status  On-going      PT LONG TERM GOAL #2   Title  He  will report pain   decreased  50% or more  bending to brush teeth and do self care    Status  On-going      PT LONG TERM GOAL #3   Title  He will be able to stand from a chair without pain.    Baseline  min pain now    Status  On-going      PT LONG TERM GOAL #4   Title  He will return to lifting with yard work with no pain.    Baseline  no reported pain after cleaning yard this week end to spread fertilizer    Status  On-going            Plan - 10/17/19 1049    Clinical Impression Statement  No better or worse . initiated RT SIJ exercise for home . Will work for 2-3 more sesssion and if no better witll discharge    PT Treatment/Interventions  Passive range of motion;Dry needling;Therapeutic exercise;Therapeutic activities;Moist Heat;Electrical Stimulation;Iontophoresis 4mg /ml Dexamethasone;Ultrasound;Traction    PT Next Visit Plan  core strength  and lifting, assess sacrum  and SI Manual if needed,  check flexion    PT Home Exercise Plan  LTR, KtC, pressups,, bridge, clam, PPT, RT SIJ  pain exer    Consulted and Agree with Plan of Care  Patient       Patient will benefit from skilled therapeutic intervention in order to improve the following deficits and impairments:  Pain, Postural dysfunction, Decreased strength, Decreased activity tolerance, Increased muscle spasms  Visit Diagnosis: Bilateral low back pain without sciatica, unspecified chronicity  Muscle spasm of back  Joint stiffness of spine     Problem List Patient Active Problem List   Diagnosis Date Noted  . Melanoma ([place])   . Atrial fibrillation ([place])   . Malignant neoplasm of prostate ([place]) 07/15/2015  . Recurrent nephrolithiasis 07/15/2015  . Personal history of prostate cancer 04/25/2015  . 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 Coronary artery disease 11/02/2011  . Urethral stricture  06/22/2011  . Hyperlipidemia   . Borderline diabetic   .  Hypertension     Darrel Hoover PT 10/17/2019, 11:30 AM  [facility] [address] [place], [place], [address] Phone: [phone]   Fax:  [phone]  Name: Antonio Clark MRN: [id number] Date of Birth: [birth year]

## 2019-10-17 NOTE — Patient Instructions (Signed)
Supine feet on 2 inch block  PPT with retraction LT hip so RT knee slightly  More forward  Then red band clam  RT knee. 10 sec hold and  5 reps 2x/day

## 2019-10-19 ENCOUNTER — Ambulatory Visit: Payer: Medicare Other

## 2019-10-19 ENCOUNTER — Other Ambulatory Visit: Payer: Self-pay

## 2019-10-19 DIAGNOSIS — M545 Low back pain: Secondary | ICD-10-CM | POA: Diagnosis not present

## 2019-10-19 DIAGNOSIS — M2560 Stiffness of unspecified joint, not elsewhere classified: Secondary | ICD-10-CM

## 2019-10-19 DIAGNOSIS — M62830 Muscle spasm of back: Secondary | ICD-10-CM | POA: Diagnosis not present

## 2019-10-19 NOTE — Therapy (Signed)
[place], [place], [address] Phone: [phone]   Fax:  [phone]  Physical Therapy Treatment  Patient Details  Name: Antonio Clark MRN: [id number] Date of Birth: Dec 02, 1935 Referring Provider (PT): Antonio Luo, MD   Encounter Date: 10/19/2019  PT End of Session - 10/19/19 1133    Visit Number  8    Number of Visits  12    Date for PT Re-Evaluation  11/10/19    Authorization Type  MCR/UHC    PT Start Time  1130    PT Stop Time  1215    PT Time Calculation (min)  45 min    Activity Tolerance  Patient tolerated treatment well;No increased pain    Behavior During Therapy  WFL for tasks assessed/performed       Past Medical History:  Diagnosis Date  . Arthritis   . Atrial fibrillation ([place])   . Back pain   . Borderline diabetic   . Dysrhythmia    a-fib  . GERD (gastroesophageal reflux disease)   . History of echocardiogram 07/ 07/ 2011  . History of lithotripsy 1989  . Hyperlipidemia   . Hypertension   . Melanoma ([place])    right chest wall (12/20)  . Pre-diabetes   . Prostate CA ([place]) 2010  . Sleep apnea    uses CPAP nightly    Past Surgical History:  Procedure Laterality Date  . AXILLARY SENTINEL NODE BIOPSY Right 08/23/2019   Procedure: SENTINEL LYMPH NODE BIOSPY RIGHT AXILLA;  Surgeon: Stark Klein, MD;  Location: [facility];  Service: General;  Laterality: Right;  . COLONOSCOPY  2002  . FASCIECTOMY Left 03/28/2019   Procedure: SEGMENTAL FASCIECTOMY LEFT RING FINGER;  Surgeon: Daryll Brod, MD;  Location: [facility];  Service: Orthopedics;  Laterality: Left;  ANESTHESIA  AXILLARY BLOCK  . INSERTION PROSTATE RADIATION SEED  8 4 2010   per Dr Rosana Hoes  . MELANOMA EXCISION Right 06/22/2019   Procedure: WIDE LOCAL EXCISION RIGHT CHEST WALL MELANOMA, ADVANCEMENT FLAP CLOSURE FOR DEFECT 3X6 CM;  Surgeon: Stark Klein, MD;  Location: [facility];   Service: General;  Laterality: Right;  . POLYPECTOMY  2002  . THROAT SURGERY  1980   benign cyst  . URETHRAL STRICTURE DILATATION     also penile implant    There were no vitals filed for this visit.  Subjective Assessment - 10/19/19 1221    Subjective  Increased pain to 5-6/10 due to picing up yard debris bending and lifting with pitch fork and cutting limbs. I feel like overall im' better with decreased pain with activity.   Manual last time made me feel good    Pain Score  5     Pain Location  --   PSIS and RT lower lumbar   Pain Orientation  Right    Pain Descriptors / Indicators  Aching    Pain Type  Chronic pain    Pain Onset  More than a month ago    Pain Frequency  Intermittent                       [facility] Adult PT Treatment/Exercise - 10/19/19 0001      Lumbar Exercises: Aerobic   Nustep  L5  LE  6 min      Modalities   Modalities  Moist Heat      Moist Heat Therapy   Number Minutes Moist Heat  8 Minutes    Moist Heat Location  Lumbar Spine      Manual Therapy   Joint Mobilization  Sacral PA glides LT and RT Lumbar PA RT and LT lower lumbar  and with RT hip IR stretching    Soft tissue mobilization  paraspinals RT and LT lower lumbar               PT Short Term Goals - 10/03/19 1138      PT SHORT TERM GOAL #1   Title  He will be independent with initial hEP    Status  Achieved      PT SHORT TERM GOAL #2   Title  He will report back pain decreased 25% or more   with getting out of chair    Baseline  with good nmechanics    Status  Achieved        PT Long Term Goals - 10/17/19 1125      PT LONG TERM GOAL #1   Title  He will be indpendent with all HEP issued    Status  On-going      PT LONG TERM GOAL #2   Title  He will report pain   decreased  50% or more  bending to brush teeth and do self care    Status  On-going      PT LONG TERM GOAL #3   Title  He will be able to stand from a chair without pain.    Baseline  min pain  now    Status  On-going      PT LONG TERM GOAL #4   Title  He will return to lifting with yard work with no pain.    Baseline  no reported pain after cleaning yard this week end to spread fertilizer    Status  On-going            Plan - 10/19/19 1133    Clinical Impression Statement  Pain decreased 3/10 post session. hefeels he is better and wants to continue PT  if able. Continues with flexion increaseing pain and some asymetry in pelvis. Will see x 4 visits with manual and exercise.    PT Treatment/Interventions  Passive range of motion;Dry needling;Therapeutic exercise;Therapeutic activities;Moist Heat;Electrical Stimulation;Iontophoresis 4mg /ml Dexamethasone;Ultrasound;Traction    PT Next Visit Plan  core strength  and lifting, assess sacrum  and SI Manual if needed,  check flexion                 FOTO    PT Home Exercise Plan  LTR, KtC, pressups,, bridge, clam, PPT, RT SIJ  pain exer    Consulted and Agree with Plan of Care  Patient       Patient will benefit from skilled therapeutic intervention in order to improve the following deficits and impairments:  Pain, Postural dysfunction, Decreased strength, Decreased activity tolerance, Increased muscle spasms  Visit Diagnosis: Muscle spasm of back  Joint stiffness of spine     Problem List Patient Active Problem List   Diagnosis Date Noted  . Melanoma ([place])   . Atrial fibrillation ([place])   . Malignant neoplasm of prostate ([place]) 07/15/2015  . Recurrent nephrolithiasis 07/15/2015  . Personal history of prostate cancer 04/25/2015  . 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 Coronary artery disease 11/02/2011  . Urethral stricture 06/22/2011  . Hyperlipidemia   . Borderline diabetic   . Hypertension     Antonio Clark,  [name]  PT 10/19/2019, 12:43 PM  [facility] [address] [place], [place], [address] Phone: [phone]    Fax:  [phone]  Name: Antonio Clark MRN: [id number] Date of Birth: 05/16/1936

## 2019-10-27 DIAGNOSIS — K649 Unspecified hemorrhoids: Secondary | ICD-10-CM | POA: Diagnosis not present

## 2019-10-30 ENCOUNTER — Ambulatory Visit: Payer: Medicare Other

## 2019-10-30 ENCOUNTER — Other Ambulatory Visit: Payer: Self-pay

## 2019-10-30 DIAGNOSIS — M545 Low back pain, unspecified: Secondary | ICD-10-CM

## 2019-10-30 DIAGNOSIS — M2560 Stiffness of unspecified joint, not elsewhere classified: Secondary | ICD-10-CM

## 2019-10-30 DIAGNOSIS — M62830 Muscle spasm of back: Secondary | ICD-10-CM

## 2019-10-30 NOTE — Therapy (Signed)
[place], [place], [address] Phone: [phone]   Fax:  [phone]  Physical Therapy Treatment  Patient Details  Name: ILYASS SLAPPY MRN: [id number] Date of Birth: 11/28/1935 Referring Provider (PT): Jenna Luo, MD   Encounter Date: 10/30/2019  PT End of Session - 10/30/19 1004    Visit Number  9    Number of Visits  12    Date for PT Re-Evaluation  11/10/19    Authorization Type  MCR/UHC    PT Start Time  1000    PT Stop Time  1045    PT Time Calculation (min)  45 min    Activity Tolerance  Patient tolerated treatment well    Behavior During Therapy  [facility] for tasks assessed/performed       Past Medical History:  Diagnosis Date  . Arthritis   . Atrial fibrillation ([place])   . Back pain   . Borderline diabetic   . Dysrhythmia    a-fib  . GERD (gastroesophageal reflux disease)   . History of echocardiogram 07/ 07/ 2011  . History of lithotripsy 1989  . Hyperlipidemia   . Hypertension   . Melanoma ([place])    right chest wall (12/20)  . Pre-diabetes   . Prostate CA ([place]) 2010  . Sleep apnea    uses CPAP nightly    Past Surgical History:  Procedure Laterality Date  . AXILLARY SENTINEL NODE BIOPSY Right 08/23/2019   Procedure: SENTINEL LYMPH NODE BIOSPY RIGHT AXILLA;  Surgeon: Stark Klein, MD;  Location: [place];  Service: General;  Laterality: Right;  . COLONOSCOPY  2002  . FASCIECTOMY Left 03/28/2019   Procedure: SEGMENTAL FASCIECTOMY LEFT RING FINGER;  Surgeon: Daryll Brod, MD;  Location: [place];  Service: Orthopedics;  Laterality: Left;  ANESTHESIA  AXILLARY BLOCK  . INSERTION PROSTATE RADIATION SEED  8 4 2010   per Dr Rosana Hoes  . MELANOMA EXCISION Right 06/22/2019   Procedure: WIDE LOCAL EXCISION RIGHT CHEST WALL MELANOMA, ADVANCEMENT FLAP CLOSURE FOR DEFECT 3X6 CM;  Surgeon: Stark Klein, MD;  Location: [place];  Service: General;   Laterality: Right;  . POLYPECTOMY  2002  . THROAT SURGERY  1980   benign cyst  . URETHRAL STRICTURE DILATATION     also penile implant    There were no vitals filed for this visit.  Subjective Assessment - 10/30/19 1008    Subjective  No pain , can feels its not normal like Lt side    Currently in Pain?  No/denies                       [facility] Adult PT Treatment/Exercise - 10/30/19 0001      Lumbar Exercises: Aerobic   Nustep  L5  LE  6 min      Lumbar Exercises: Standing   Row  20 reps    Theraband Level (Row)  Level 2 (Red)    Shoulder Extension  20 reps    Theraband Level (Shoulder Extension)  Level 2 (Red)    Other Standing Lumbar Exercises  trunk rotation cross chest pull x 20 red band       Manual Therapy   Joint Mobilization  Sacral PA glides LT and RT Lumbar PA RT and LT lower lumbar  and with RT hip IR stretching    Soft tissue mobilization  paraspinals RT and LT lower lumbar  PT Short Term Goals - 10/03/19 1138      PT SHORT TERM GOAL #1   Title  He will be independent with initial hEP    Status  Achieved      PT SHORT TERM GOAL #2   Title  He will report back pain decreased 25% or more   with getting out of chair    Baseline  with good nmechanics    Status  Achieved        PT Long Term Goals - 10/17/19 1125      PT LONG TERM GOAL #1   Title  He will be indpendent with all HEP issued    Status  On-going      PT LONG TERM GOAL #2   Title  He will report pain   decreased  50% or more  bending to brush teeth and do self care    Status  On-going      PT LONG TERM GOAL #3   Title  He will be able to stand from a chair without pain.    Baseline  min pain now    Status  On-going      PT LONG TERM GOAL #4   Title  He will return to lifting with yard work with no pain.    Baseline  no reported pain after cleaning yard this week end to spread fertilizer    Status  On-going            Plan - 10/30/19 1005     Clinical Impression Statement  No pain post and agreed to band HEP.   Continue manual and exercise    PT Treatment/Interventions  Passive range of motion;Dry needling;Therapeutic exercise;Therapeutic activities;Moist Heat;Electrical Stimulation;Iontophoresis 4mg /ml Dexamethasone;Ultrasound;Traction    PT Next Visit Plan  core strength  and lifting, assess sacrum  and SI Manual if needed,  check flexion                 FOTO    PT Home Exercise Plan  LTR, KtC, pressups,, bridge, clam, PPT, RT SIJ  pain exer    Consulted and Agree with Plan of Care  Patient       Patient will benefit from skilled therapeutic intervention in order to improve the following deficits and impairments:  Pain, Postural dysfunction, Decreased strength, Decreased activity tolerance, Increased muscle spasms  Visit Diagnosis: Muscle spasm of back  Joint stiffness of spine  Bilateral low back pain without sciatica, unspecified chronicity     Problem List Patient Active Problem List   Diagnosis Date Noted  . Melanoma ([place])   . Atrial fibrillation ([place])   . Malignant neoplasm of prostate ([place]) 07/15/2015  . Recurrent nephrolithiasis 07/15/2015  . Personal history of prostate cancer 04/25/2015  . 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 Coronary artery disease 11/02/2011  . Urethral stricture 06/22/2011  . Hyperlipidemia   . Borderline diabetic   . Hypertension     Darrel Hoover  PT 10/30/2019, 10:46 AM  [facility] [address] [place], [place], [address] Phone: [phone]   Fax:  [phone]  Name: WHITAKER ISER MRN: [id number] Date of Birth: [birth year]

## 2019-11-02 ENCOUNTER — Ambulatory Visit: Payer: Medicare Other

## 2019-11-02 ENCOUNTER — Other Ambulatory Visit: Payer: Self-pay

## 2019-11-02 DIAGNOSIS — M545 Low back pain, unspecified: Secondary | ICD-10-CM

## 2019-11-02 DIAGNOSIS — M2560 Stiffness of unspecified joint, not elsewhere classified: Secondary | ICD-10-CM | POA: Diagnosis not present

## 2019-11-02 DIAGNOSIS — M62830 Muscle spasm of back: Secondary | ICD-10-CM | POA: Diagnosis not present

## 2019-11-02 NOTE — Therapy (Signed)
[place], [place], [address] Phone: [phone]   Fax:  [phone]  Physical Therapy Treatment  Patient Details  Name: Antonio [place] MRN: [id number] Date of Birth: 1935-10-07 Referring Provider (PT): Jenna Luo, MD   Encounter Date: 11/02/2019  PT End of Session - 11/02/19 1355    Visit Number  10    Number of Visits  12    Date for PT Re-Evaluation  11/10/19    Authorization Type  MCR/UHC    PT Start Time  0153    PT Stop Time  0232    PT Time Calculation (min)  39 min    Activity Tolerance  Patient tolerated treatment well    Behavior During Therapy  [facility] for tasks assessed/performed       Past Medical History:  Diagnosis Date  . Arthritis   . Atrial fibrillation ([place])   . Back pain   . Borderline diabetic   . Dysrhythmia    a-fib  . GERD (gastroesophageal reflux disease)   . History of echocardiogram 07/ 07/ 2011  . History of lithotripsy 1989  . Hyperlipidemia   . Hypertension   . Melanoma ([place])    right chest wall (12/20)  . Pre-diabetes   . Prostate CA ([place]) 2010  . Sleep apnea    uses CPAP nightly    Past Surgical History:  Procedure Laterality Date  . AXILLARY SENTINEL NODE BIOPSY Right 08/23/2019   Procedure: SENTINEL LYMPH NODE BIOSPY RIGHT AXILLA;  Surgeon: Stark Klein, MD;  Location: [place];  Service: General;  Laterality: Right;  . COLONOSCOPY  2002  . FASCIECTOMY Left 03/28/2019   Procedure: SEGMENTAL FASCIECTOMY LEFT RING FINGER;  Surgeon: Daryll Brod, MD;  Location: [place];  Service: Orthopedics;  Laterality: Left;  ANESTHESIA  AXILLARY BLOCK  . INSERTION PROSTATE RADIATION SEED  8 4 2010   per Dr Rosana Hoes  . MELANOMA EXCISION Right 06/22/2019   Procedure: WIDE LOCAL EXCISION RIGHT CHEST WALL MELANOMA, ADVANCEMENT FLAP CLOSURE FOR DEFECT 3X6 CM;  Surgeon: Stark Klein, MD;  Location: [place];  Service: General;   Laterality: Right;  . POLYPECTOMY  2002  . THROAT SURGERY  1980   benign cyst  . URETHRAL STRICTURE DILATATION     also penile implant    There were no vitals filed for this visit.  Subjective Assessment - 11/02/19 1358    Subjective  No pain , can feels its not normal like Lt side    Currently in Pain?  No/denies                       [facility] Adult PT Treatment/Exercise - 11/02/19 0001      Lumbar Exercises: Aerobic   Nustep  L5  LE  6 min      Lumbar Exercises: Standing   Row  20 reps    Theraband Level (Row)  Level 4 (Blue)    Shoulder Extension  20 reps    Theraband Level (Shoulder Extension)  Level 4 (Blue)      Lumbar Exercises: Supine   Clam  20 reps    Clam Limitations  LT with PPT    Bridge with [employer]  15 reps    Bridge with [employer] Limitations  pelvic lift with PPT      Lumbar Exercises: Sidelying   Other Sidelying Lumbar Exercises  LT side  ball between feet  bilateral knee lift with RT knee lift and hold x  10 sec x 8 reps      Lumbar Exercises: Prone   Straight Leg Raises Limitations  2x10 RT/LT       Manual Therapy   Joint Mobilization  Sacral PA glides LT and RT Lumbar PA RT and LT lower lumbar  and with RT hip IR stretching    Soft tissue mobilization  paraspinals RT and LT lower lumbar               PT Short Term Goals - 10/03/19 1138      PT SHORT TERM GOAL #1   Title  He will be independent with initial hEP    Status  Achieved      PT SHORT TERM GOAL #2   Title  He will report back pain decreased 25% or more   with getting out of chair    Baseline  with good nmechanics    Status  Achieved        PT Long Term Goals - 11/02/19 1434      PT LONG TERM GOAL #1   Title  He will be indpendent with all HEP issued    Status  On-going      PT LONG TERM GOAL #2   Title  He will report pain   decreased  50% or more  bending to brush teeth and do self care    Status  Achieved      PT LONG TERM GOAL #3   Title   He will be able to stand from a chair without pain.    Baseline  min pain now    Status  On-going      PT LONG TERM GOAL #4   Title  He will return to lifting with yard work with no pain.    Baseline  after lastentry he did yard work with bending and had increased RT SI pain    Status  On-going      PT LONG TERM GOAL #5   Title  FOTO score with improve to  34 % limited    Status  Unable to assess            Plan - 11/02/19 1356    Clinical Impression Statement  Manual eases stiffness. He feels better after manual.  added some exercises for stability in bck and pelvis.    Will continue per plan then probable discharge    PT Treatment/Interventions  Passive range of motion;Dry needling;Therapeutic exercise;Therapeutic activities;Moist Heat;Electrical Stimulation;Iontophoresis 4mg /ml Dexamethasone;Ultrasound;Traction    PT Next Visit Plan  core strength  I Manual ,  check flexion                 FOTO    PT Home Exercise Plan  LTR, KtC, pressups,, bridge, clam, PPT, RT SIJ  pain exer, green band row/shld ext , , Lt single leg clam with PPT,  prone hip ext over pillow,   ball squeeze  with pelvic lift and PPT    Consulted and Agree with Plan of Care  Patient       Patient will benefit from skilled therapeutic intervention in order to improve the following deficits and impairments:  Pain, Postural dysfunction, Decreased strength, Decreased activity tolerance, Increased muscle spasms  Visit Diagnosis: Muscle spasm of back  Joint stiffness of spine  Bilateral low back pain without sciatica, unspecified chronicity     Problem List Patient Active Problem List  Diagnosis Date Noted  . Melanoma ([place])   . Atrial fibrillation ([place])   . Malignant neoplasm of prostate ([place]) 07/15/2015  . Recurrent nephrolithiasis 07/15/2015  . Personal history of prostate cancer 04/25/2015  . 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 Coronary artery  disease 11/02/2011  . Urethral stricture 06/22/2011  . Hyperlipidemia   . Borderline diabetic   . Hypertension     Darrel Hoover PT 11/02/2019, 2:41 PM  [facility] [address] [place], [place], [address] Phone: [phone]   Fax:  [phone]  Name: Antonio [place] MRN: [id number] Date of Birth: 1935-09-27

## 2019-11-03 ENCOUNTER — Telehealth: Payer: Self-pay | Admitting: Family Medicine

## 2019-11-03 DIAGNOSIS — M545 Low back pain, unspecified: Secondary | ICD-10-CM

## 2019-11-03 NOTE — Telephone Encounter (Signed)
Pt called and states that he has gone through about 2 months of PT and his back is better but still in pain and wold like to know what is the next step?

## 2019-11-06 ENCOUNTER — Ambulatory Visit: Payer: Medicare Other | Admitting: Physical Therapy

## 2019-11-06 ENCOUNTER — Other Ambulatory Visit: Payer: Self-pay

## 2019-11-06 ENCOUNTER — Encounter: Payer: Self-pay | Admitting: Physical Therapy

## 2019-11-06 DIAGNOSIS — M2560 Stiffness of unspecified joint, not elsewhere classified: Secondary | ICD-10-CM | POA: Diagnosis not present

## 2019-11-06 DIAGNOSIS — M62830 Muscle spasm of back: Secondary | ICD-10-CM | POA: Diagnosis not present

## 2019-11-06 DIAGNOSIS — M545 Low back pain, unspecified: Secondary | ICD-10-CM

## 2019-11-06 NOTE — Therapy (Addendum)
[place], [place], [address] Phone: [phone]   Fax:  [phone]  Physical Therapy Treatment/Discharge  Patient Details  Name: Antonio [place] MRN: [id number] Date of Birth: April 27, 1936 Referring Provider (PT): Jenna Luo, MD   Encounter Date: 11/06/2019  PT End of Session - 11/06/19 0913    Visit Number  11    Number of Visits  12    Date for PT Re-Evaluation  11/10/19    Authorization Type  MCR/UHC    PT Start Time  0912    PT Stop Time  0950    PT Time Calculation (min)  38 min    Activity Tolerance  Patient tolerated treatment well    Behavior During Therapy  [facility] for tasks assessed/performed;Anxious       Past Medical History:  Diagnosis Date  . Arthritis   . Atrial fibrillation ([place])   . Back pain   . Borderline diabetic   . Dysrhythmia    a-fib  . GERD (gastroesophageal reflux disease)   . History of echocardiogram 07/ 07/ 2011  . History of lithotripsy 1989  . Hyperlipidemia   . Hypertension   . Melanoma ([place])    right chest wall (12/20)  . Pre-diabetes   . Prostate CA ([place]) 2010  . Sleep apnea    uses CPAP nightly    Past Surgical History:  Procedure Laterality Date  . AXILLARY SENTINEL NODE BIOPSY Right 08/23/2019   Procedure: SENTINEL LYMPH NODE BIOSPY RIGHT AXILLA;  Surgeon: Stark Klein, MD;  Location: [place];  Service: General;  Laterality: Right;  . COLONOSCOPY  2002  . FASCIECTOMY Left 03/28/2019   Procedure: SEGMENTAL FASCIECTOMY LEFT RING FINGER;  Surgeon: Daryll Brod, MD;  Location: [place];  Service: Orthopedics;  Laterality: Left;  ANESTHESIA  AXILLARY BLOCK  . INSERTION PROSTATE RADIATION SEED  8 4 2010   per Dr Rosana Hoes  . MELANOMA EXCISION Right 06/22/2019   Procedure: WIDE LOCAL EXCISION RIGHT CHEST WALL MELANOMA, ADVANCEMENT FLAP CLOSURE FOR DEFECT 3X6 CM;  Surgeon: Stark Klein, MD;  Location: [place];   Service: General;  Laterality: Right;  . POLYPECTOMY  2002  . THROAT SURGERY  1980   benign cyst  . URETHRAL STRICTURE DILATATION     also penile implant    There were no vitals filed for this visit.  Subjective Assessment - 11/06/19 0914    Subjective  I was able to ride lawn mower saturday without difficulty but I cannot bend over to pick things up. It feels good after PT but it goes back the next day. It is just right there (pointing to Rt SIJ).    Diagnostic tests  xray:  degenerative changes    Patient Stated Goals  Decrease pain with bending and activity.    Currently in Pain?  Yes    Pain Score  6     Pain Location  --   SIJ   Pain Orientation  Right    Pain Descriptors / Indicators  --   I can press on thtat one spot   Aggravating Factors   bending forward, mid range in sit to stand    Pain Relieving Factors  laying down, standing all the way up         [facility] PT Assessment - 11/06/19 0001      Assessment   Medical Diagnosis  LBP    Referring Provider (PT)  Jenna Luo, MD  Onset Date/Surgical Date  --   09/2018     Prior Function   Level of Independence  Independent      Observation/Other Assessments   Focus on Therapeutic Outcomes (FOTO)   54% limited      Posture/Postural Control   Posture Comments  decreased curvature      AROM   Lumbar Flexion  30   pin upon standing at 10 deg   Lumbar Extension  20    Lumbar - Right Side Bend  20    Lumbar - Left Side Bend  18                           PT Education - 11/06/19 1054    Education Details  anatomy of condition, HEP, injections v surgery, FOTO, goals, progress & plateau    Person(s) Educated  Patient    Methods  Explanation    Comprehension  Verbalized understanding       PT Short Term Goals - 10/03/19 1138      PT SHORT TERM GOAL #1   Title  He will be independent with initial hEP    Status  Achieved      PT SHORT TERM GOAL #2   Title  He will report back pain  decreased 25% or more   with getting out of chair    Baseline  with good nmechanics    Status  Achieved        PT Long Term Goals - 11/06/19 0935      PT LONG TERM GOAL #1   Title  He will be indpendent with all HEP issued    Status  Achieved      PT LONG TERM GOAL #2   Title  He will report pain   decreased  50% or more  bending to brush teeth and do self care    Status  Achieved      PT LONG TERM GOAL #3   Title  He will be able to stand from a chair without pain.    Baseline  pain every time at mid range but fine when standing    Status  On-going      PT LONG TERM GOAL #4   Title  He will return to lifting with yard work with no pain.    Baseline  was able to ride mower without limitation but had pain with bending to pick up sticks    Status  On-going      PT LONG TERM GOAL #5   Title  FOTO score with improve to  34 % limited    Baseline  54% limited    Status  On-going            Plan - 11/06/19 1138    Clinical Impression Statement  Antonio Clark has made progress since beginning PT in that pain has centralized to his Rt SIJ and he reports doing more activity than prior. He is very frustrated by the pain and feels that it is causing him to feel depressed. We discussed DN as a possible option but he feels that he wants to discuss his current state with PCP. I advised that I feel he would benefit from SIJ injection and then continue with PT following. He will be officially d/c from PT at this time pending continued treatment from MD. Encouraged him to contact us with any further questions.    PT Treatment/Interventions  Passive range of motion;Dry needling;Therapeutic exercise;Therapeutic activities;Moist Heat;Electrical Stimulation;Iontophoresis 50m/ml Dexamethasone;Ultrasound;Traction    PT Home Exercise Plan  LTR, KtC, pressups,, bridge, clam, PPT, RT SIJ  pain exer, green band row/shld ext , , Lt single leg clam with PPT,  prone hip ext over pillow,   ball squeeze  with  pelvic lift and PPT    Consulted and Agree with Plan of Care  Patient       Patient will benefit from skilled therapeutic intervention in order to improve the following deficits and impairments:  Pain, Postural dysfunction, Decreased strength, Decreased activity tolerance, Increased muscle spasms  Visit Diagnosis: Muscle spasm of back  Joint stiffness of spine  Bilateral low back pain without sciatica, unspecified chronicity     Problem List Patient Active Problem List   Diagnosis Date Noted  . [place]anoma ([place]   . Atrial fibrillation ([place]   . Malignant [place] of [place] 07/15/2015  . Recurrent nephrolithiasis 07/15/2015  . Personal history of prostate cancer 04/25/2015  . 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 Coronary artery disease 11/02/2011  . Urethral stricture 06/22/2011  . Hyperlipidemia   . Borderline diabetic   . Hypertension     PHYSICAL THERAPY DISCHARGE SUMMARY  Visits from Start of Care: 11  Current functional level related to goals / functional outcomes: See above   Remaining deficits: See above   Education / Equipment: Anatomy of condition, POC, HEP, exercise form/rationale  Plan: Patient agrees to discharge.  Patient goals were partially met. Patient is being discharged due to lack of progress.  ?????     Antonio Clark C. Zinia Innocent PT, DPT 11/06/19 11:59 AM   [facility] [address]Phone: [phone]  Fax:  [phone] Name: HCEBERT DETTMANNMRN: [id number] of Birth: 6April 11, 1937

## 2019-11-06 NOTE — Telephone Encounter (Signed)
If the pain is severe, the next step would be to see orthopedics

## 2019-11-07 NOTE — Telephone Encounter (Signed)
LMTRC

## 2019-11-08 NOTE — Telephone Encounter (Signed)
Pt aware and referral placed.  

## 2019-11-15 DIAGNOSIS — M545 Low back pain: Secondary | ICD-10-CM | POA: Diagnosis not present

## 2019-11-15 DIAGNOSIS — M5136 Other intervertebral disc degeneration, lumbar region: Secondary | ICD-10-CM | POA: Diagnosis not present

## 2019-11-27 DIAGNOSIS — M545 Low back pain: Secondary | ICD-10-CM | POA: Diagnosis not present

## 2019-12-05 DIAGNOSIS — M545 Low back pain: Secondary | ICD-10-CM | POA: Diagnosis not present

## 2019-12-14 ENCOUNTER — Telehealth: Payer: Self-pay

## 2019-12-14 NOTE — Telephone Encounter (Signed)
   Primary Cardiologist: Candee Furbish, MD  Chart reviewed as part of pre-operative protocol coverage. Patient was contacted 12/14/2019 in reference to pre-operative risk assessment for pending surgery as outlined below.  GARRON GALEANA was last seen on 10/02/19 by Truitt Merle, NP.  Since that day, SILVESTER WITHROW has done well from a cardiac standpoint.  Therefore, based on ACC/AHA guidelines, the patient would be at acceptable risk for the planned procedure without further cardiovascular testing.   Per Pharmacy-  Procedure: LUMBAR ESI Date of procedure: 12/26/19  CHADS2-VASc score of  5 (HTN, AGE, DM2, CAD, AGE)  CrCl 51 ml/min  Per office protocol, patient can hold pradaxa for 4 days prior to procedure.    I will route this recommendation to the requesting party via Epic fax function and remove from pre-op pool.  Please call with questions.  Reino Bellis, NP 12/14/2019, 10:59 AM

## 2019-12-14 NOTE — Telephone Encounter (Signed)
Patient with diagnosis of afib on Pradaxa for anticoagulation.    Procedure: LUMBAR ESI Date of procedure: 12/26/19  CHADS2-VASc score of  5 (HTN, AGE, DM2, CAD, AGE)  CrCl 51 ml/min  Per office protocol, patient can hold pradaxa for 4 days prior to procedure.

## 2019-12-14 NOTE — Telephone Encounter (Signed)
   [facility] Pre-operative Risk Assessment    Request for surgical clearance:  1. What type of surgery is being performed? LUMBAR ESI  2. When is this surgery scheduled? 12/26/19   3. What type of clearance is required (medical clearance vs. Pharmacy clearance to hold med vs. Both)? PHARMACY  4. Are there any medications that need to be held prior to surgery and how long? PRADAXA X 5 DAYS PRIOR TO PROCEDURE   5. Practice name and name of physician performing surgery? [facility]; DR. Nelva Bush   6. What is your office phone number [phone]    7.   What is your office fax number: [phone] ATTN: SUSAN  8.   Anesthesia type (None, local, MAC, general) ? NONE LISTED   Antonio Clark 12/14/2019, 8:25 AM  _________________________________________________________________   (provider comments below)

## 2019-12-14 NOTE — Telephone Encounter (Addendum)
Pharmacy, please comment regarding holding pradaxa for ESI, (hx of Afib) route response to P CV DIV PREOP.Thanks!

## 2019-12-26 DIAGNOSIS — M5136 Other intervertebral disc degeneration, lumbar region: Secondary | ICD-10-CM | POA: Diagnosis not present

## 2020-01-10 NOTE — Progress Notes (Deleted)
CARDIOLOGY OFFICE NOTE  Date:  01/10/2020    Antonio Clark Date of Birth: [birth year] Medical Record [id number]  PCP:  Susy Frizzle, MD  Cardiologist:  Servando Snare & ***    No chief complaint on file.   History of Present Illness: Antonio Clark is a 84 y.o. male who presents today for a *** Seen for Dr. Marlou Porch. Formerly seen by Dr. Webb Silversmith well as Dr. Susa Simmonds.   I previously took care of both his brother and his sister in law(Bill and Bowman Blazina).  He has a history of HTN and HLD. Strong FH for CAD. Remote Cardiolite in 2013 was normal.   Seen back in Mayof 2019 by Dr. Marlou Porch- had had AF while in the Urology office. Found to have OSA - but CPAP made him claustrophobic. Placed on Eliquis- changed to Pradaxa due to insurance coverage.  I have followed him over the past year or so - we have increased his HCTZ for better BP control - last seen in November - was doing ok - still loves too much salt. He was back exercising.   Phone call last month about needing lymph node biopsy.   The patient does not have symptoms concerning for COVID-19 infection (fever, chills, cough, or new shortness of breath).   Comes in today. Here alone. He is doing well. No chest pain. Breahting is good. More issues with his back. Getting some therapy for his back - this has helped some and he is trying to keep walking. He wants to find out what is wrong with his back - discussing with PCP.  He has had both COVID vaccines. Tolerating his Pradaxa. No falls. [facility] and enjoying that.   The patient {does/does not:200015} have symptoms concerning for COVID-19 infection (fever, chills, cough, or new shortness of breath).   Comes in today. Here with   Past Medical History:  Diagnosis Date  . Arthritis   . Atrial fibrillation ([place])   . Back pain   . Borderline diabetic   . Dysrhythmia    a-fib  . GERD (gastroesophageal reflux disease)   . History of  echocardiogram 07/ 07/ 2011  . History of lithotripsy 1989  . Hyperlipidemia   . Hypertension   . Melanoma ([place])    right chest wall (12/20)  . Pre-diabetes   . Prostate CA ([place]) 2010  . Sleep apnea    uses CPAP nightly    Past Surgical History:  Procedure Laterality Date  . AXILLARY SENTINEL NODE BIOPSY Right 08/23/2019   Procedure: SENTINEL LYMPH NODE BIOSPY RIGHT AXILLA;  Surgeon: Stark Klein, MD;  Location: [facility];  Service: General;  Laterality: Right;  . COLONOSCOPY  2002  . FASCIECTOMY Left 03/28/2019   Procedure: SEGMENTAL FASCIECTOMY LEFT RING FINGER;  Surgeon: Daryll Brod, MD;  Location: [facility];  Service: Orthopedics;  Laterality: Left;  ANESTHESIA  AXILLARY BLOCK  . INSERTION PROSTATE RADIATION SEED  8 4 2010   per Dr Rosana Hoes  . MELANOMA EXCISION Right 06/22/2019   Procedure: WIDE LOCAL EXCISION RIGHT CHEST WALL MELANOMA, ADVANCEMENT FLAP CLOSURE FOR DEFECT 3X6 CM;  Surgeon: Stark Klein, MD;  Location: [facility];  Service: General;  Laterality: Right;  . POLYPECTOMY  2002  . THROAT SURGERY  1980   benign cyst  . URETHRAL STRICTURE DILATATION     also penile implant     Medications: No outpatient medications have been  marked as taking for the 01/24/20 encounter (Appointment) with Burtis Junes, NP.     Allergies: No Known Allergies  Social History: The patient  reports that he has never smoked. He has never used smokeless tobacco. He reports current alcohol use. He reports that he does not use drugs.   Family History: The patient's ***family history includes Arrhythmia in his brother; Cancer in his mother; Colon cancer (age of onset: 4) in his mother; Coronary artery disease in his brother; Diabetes in his brother and brother; Hearing loss in his brother; Heart disease in his brother and father; Heart failure in his brother; Hyperlipidemia in his sister; Hypertension in his brother, brother, brother, and  father; Stroke in his mother and sister; Sudden death in his father; Suicidality in his sister.   Review of Systems: Please see the history of present illness.   All other systems are reviewed and negative.   Physical Exam: VS:  There were no vitals taken for this visit. Marland Kitchen  BMI There is no height or weight on file to calculate BMI.  Wt Readings from Last 3 Encounters:  10/02/19 197 lb 12.8 oz (89.7 kg)  08/23/19 194 lb 7.1 oz (88.2 kg)  08/17/19 196 lb (88.9 kg)    General: Pleasant. Well developed, well nourished and in no acute distress.   HEENT: Normal.  Neck: Supple, no JVD, carotid bruits, or masses noted.  Cardiac: ***Regular rate and rhythm. No murmurs, rubs, or gallops. No edema.  Respiratory:  Lungs are clear to auscultation bilaterally with normal work of breathing.  GI: Soft and nontender.  MS: No deformity or atrophy. Gait and ROM intact.  Skin: Warm and dry. Color is normal.  Neuro:  Strength and sensation are intact and no gross focal deficits noted.  Psych: Alert, appropriate and with normal affect.   LABORATORY DATA:  EKG:  EKG {ACTION; IS/IS GI:[id number] ordered today.  Personally reviewed by me. This demonstrates ***.  Lab Results  Component Value Date   WBC 5.4 08/14/2019   HGB 13.6 08/14/2019   HCT 40.6 08/14/2019   PLT 140 08/14/2019   GLUCOSE 159 (H) 08/18/2019   CHOL 167 08/14/2019   TRIG 184 (H) 08/14/2019   HDL 45 08/14/2019   LDLCALC 94 08/14/2019   ALT 11 08/14/2019   AST 15 08/14/2019   NA 138 08/18/2019   K 4.6 08/18/2019   CL 105 08/18/2019   CREATININE 1.12 08/18/2019   BUN 23 08/18/2019   CO2 24 08/18/2019   INR 1.0 03/06/2009   HGBA1C 7.0 (H) 08/14/2019     BNP (last 3 results) No results for input(s): BNP in the last 8760 hours.  ProBNP (last 3 results) No results for input(s): PROBNP in the last 8760 hours.   Other Studies Reviewed Today:   Assessment/Plan: Echocardiogram 10/2017 - Left ventricle: The cavity size was  normal. There was mild focal basal hypertrophy of the septum. Systolic function was normal. The estimated ejection fraction was in the range of 50% to 55%. Wall motion was normal; there were no regional wall motion abnormalities. Features are consistent with a pseudonormal left ventricular filling pattern, with concomitant abnormal relaxation and increased filling pressure (grade 2 diastolic dysfunction). - Aortic valve: Trileaflet; moderately thickened, mildly calcified leaflets. There was mild regurgitation. Regurgitation pressure half-time: 701 ms. - Aorta: Aortic root dimension: 38 mm (ED). - Aortic root: The aortic root was mildly dilated. - Mitral valve: Calcified annulus. There was trivial regurgitation. - Left atrium: The atrium was  moderately dilated. - Right ventricle: The cavity size was mildly dilated. Wall thickness was normal. - Atrial septum: There was increased thickness of the septum, consistent with lipomatous hypertrophy. - Tricuspid valve: There was trivial regurgitation. - Pulmonic valve: There was trivial regurgitation.    ASSESSMENT & PLAN:   1. HTN- BP looks great - will continue with his HCTZ at current dose - he is doing much better with salt restriction. He is to let me know if he has any dizzy spells.   2. PAF - in sinus by exam today.   3. Chronic anticoagulation - no problems noted.   4. OSA - on CPAP  5. DM - most recent A1C is noted - he is trying to work on this.   Marland Kitchen COVID-19 Education: The signs and symptoms of COVID-19 were discussed with the patient and how to seek care for testing (follow up with PCP or arrange E-visit).  The importance of social distancing, staying at home, hand hygiene and wearing a mask when out in public were discussed today.  Current medicines are reviewed with the patient today.  The patient does not have concerns regarding medicines other than what has been noted above.  The following  changes have been made:  See above.  Labs/ tests ordered today include:   No orders of the defined types were placed in this encounter.    Disposition:   FU with *** in {gen number [id number] {Days to years:10300}.   Patient is agreeable to this plan and will call if any problems develop in the interim.   SignedTruitt Merle, NP  01/10/2020 7:50 AM  [facility] [address] [place], [place]  [address] Phone: [phone] Fax: [phone]

## 2020-01-11 ENCOUNTER — Other Ambulatory Visit: Payer: Self-pay | Admitting: Cardiology

## 2020-01-11 DIAGNOSIS — M545 Low back pain: Secondary | ICD-10-CM | POA: Diagnosis not present

## 2020-01-11 NOTE — Telephone Encounter (Signed)
Age 84, weight 89kg, SCr 1.12, CrCl 95mL/min, afib indication, last OV Feb 2021

## 2020-01-16 ENCOUNTER — Encounter: Payer: Self-pay | Admitting: *Deleted

## 2020-01-17 ENCOUNTER — Other Ambulatory Visit: Payer: Self-pay

## 2020-01-17 ENCOUNTER — Ambulatory Visit (INDEPENDENT_AMBULATORY_CARE_PROVIDER_SITE_OTHER): Payer: Medicare Other | Admitting: Dermatology

## 2020-01-17 DIAGNOSIS — L570 Actinic keratosis: Secondary | ICD-10-CM | POA: Diagnosis not present

## 2020-01-17 DIAGNOSIS — L309 Dermatitis, unspecified: Secondary | ICD-10-CM

## 2020-01-17 DIAGNOSIS — Z1283 Encounter for screening for malignant neoplasm of skin: Secondary | ICD-10-CM | POA: Diagnosis not present

## 2020-01-17 MED ORDER — BETAMETHASONE DIPROPIONATE AUG 0.05 % EX CREA: TOPICAL_CREAM | Freq: Two times a day (BID) | CUTANEOUS | 3 refills | Status: DC

## 2020-01-24 ENCOUNTER — Ambulatory Visit: Payer: Medicare Other | Admitting: Nurse Practitioner

## 2020-01-25 DIAGNOSIS — M47896 Other spondylosis, lumbar region: Secondary | ICD-10-CM | POA: Diagnosis not present

## 2020-01-25 DIAGNOSIS — M47816 Spondylosis without myelopathy or radiculopathy, lumbar region: Secondary | ICD-10-CM | POA: Diagnosis not present

## 2020-02-12 ENCOUNTER — Encounter: Payer: Self-pay | Admitting: Dermatology

## 2020-02-12 NOTE — Progress Notes (Signed)
   Follow-Up Visit   Subjective  Antonio Clark is a 84 y.o. male who presents for the following: Rash (On abdomen x 1 month. Started on side areas. The back itches more than the side. Used the triamcinolone cream and it did help some. No very itchy, Just red. Comes and goes.) and Skin Problem (Check scalp. Patient has alot of crusty spots that he wants check. ).  Rash Location: Worst on torso Duration: Months Quality:  Associated Signs/Symptoms: Modifying Factors: Topical triamcinolone some help but itching is typically not severe Severity:  Timing: Context:   The following portions of the chart were reviewed this encounter and updated as appropriate:     Objective  Well appearing patient in no apparent distress; mood and affect are within normal limits.  A full examination was performed including scalp, head, eyes, ears, nose, lips, neck, chest, axillae, abdomen, back, buttocks, bilateral upper extremities, bilateral lower extremities, hands, feet, fingers, toes, fingernails, and toenails. All findings within normal limits unless otherwise noted below.   Assessment & Plan  Dermatitis (6) Left Abdomen (side) - Upper; Right Abdomen (side) - Lower; Left Upper Back; Right Upper Back; Left Lower Back; Right Lower Back  augmented betamethasone dipropionate (DIPROLENE-AF) 0.05 % cream - Left Abdomen (side) - Upper, Left Lower Back, Left Upper Back, Right Abdomen (side) - Lower, Right Lower Back, Right Upper Back  AK (actinic keratosis) (2) Mid Parietal Scalp; Dorsum of Nose  Destruction of lesion - Mid Parietal Scalp  Destruction method: cryotherapy   Informed consent: discussed and consent obtained   Timeout:  patient name, date of birth, surgical site, and procedure verified Lesion destroyed using liquid nitrogen: Yes   Region frozen until ice ball extended beyond lesion: Yes   Outcome: patient tolerated procedure well with no complications    Skin cancer screening performed  today.

## 2020-02-16 ENCOUNTER — Telehealth: Payer: Self-pay | Admitting: *Deleted

## 2020-02-16 NOTE — Telephone Encounter (Signed)
   [facility] Pre-operative Risk Assessment    HEARTCARE STAFF: - Please ensure there is not already an duplicate clearance open for this procedure. - Under Visit Info/Reason for Call, type in Other and utilize the format Clearance MM/DD/YY or Clearance TBD. Do not use dashes or single digits. - If request is for dental extraction, please clarify the # of teeth to be extracted.  Request for surgical clearance:  1. What type of surgery is being performed? Selective nerve root block   2. When is this surgery scheduled? TBD   3. What type of clearance is required (medical clearance vs. Pharmacy clearance to hold med vs. Both)? medical  4. Are there any medications that need to be held prior to surgery and how long? Pradaxa?   5. Practice name and name of physician performing surgery? [facility]; Suella Broad  6. What is the office phone number? [phone]   7.   What is the office fax number? [phone]  8.   Anesthesia type (None, local, MAC, general) ? Not listed   Antonio Clark 02/16/2020, 3:41 PM  _________________________________________________________________   (provider comments below)

## 2020-02-16 NOTE — Telephone Encounter (Signed)
Clinical pharmacist to review. Patient was last cleared for [facility] in May. Recommendation at the time was "hold pradaxa for 4 days prior to procedure"

## 2020-02-19 NOTE — Telephone Encounter (Signed)
   Primary Cardiologist: Candee Furbish, MD  Chart reviewed as part of pre-operative protocol coverage. Patient was contacted 02/19/2020 in reference to pre-operative risk assessment for pending surgery as outlined below.  Antonio Clark was last seen on 10/02/19 by Truitt Merle.  Since that day, Antonio Clark has done fine. He exercises routinely including riding a stationary bike for 20 minutes at a time and walking on a treadmill for 30 minutes at a time. He can easily complete 4 METs without anginal complaints. More recently he has noticed an increase in his paroxysmal atrial fibrillation. He does not appreciate any racing heart beats but has had some increased fatigue when in atrial fibrillation. Based on this, he preferred to discuss with Dr. Marlou Porch prior to his procedure - he has a visit with him 03/08/20 which is a reasonable timeframe so as to not delay his surgery too long.   Of note, patient reports that Dr. Nelva Bush told him he did not need to hold his pradaxa for this type of injection.   Pre-op covering staff: - Please add "pre-op clearance" to the appointment notes so provider is aware. - Please contact requesting surgeon's office via preferred method (i.e, phone, fax) to inform them of need for appointment prior to surgery. - Please clarify with the requesting surgeon's office whether pradaxa needs to be held for his injection.   Abigail Butts, PA-C  02/19/2020, 10:36 AM  .

## 2020-02-19 NOTE — Telephone Encounter (Signed)
Added into appt notes for pre-op on

## 2020-02-19 NOTE — Telephone Encounter (Signed)
Forwarded to requesting providers office via EPIC fax function 

## 2020-02-19 NOTE — Telephone Encounter (Signed)
Patient with diagnosis of afib on Pradaxa for anticoagulation.    Procedure: Selective nerve root block  Date of procedure: TBD  CHADS2-VASc score of  5 (HTN, AGE, DM2, CAD, AGE)  CrCl 50 ml/min  Per office protocol, patient can hold Pradaxa for 3 days prior to procedure.

## 2020-03-06 ENCOUNTER — Other Ambulatory Visit: Payer: Self-pay

## 2020-03-06 ENCOUNTER — Ambulatory Visit (INDEPENDENT_AMBULATORY_CARE_PROVIDER_SITE_OTHER): Payer: Medicare Other | Admitting: Dermatology

## 2020-03-06 DIAGNOSIS — L570 Actinic keratosis: Secondary | ICD-10-CM | POA: Diagnosis not present

## 2020-03-06 DIAGNOSIS — L309 Dermatitis, unspecified: Secondary | ICD-10-CM

## 2020-03-06 NOTE — Patient Instructions (Addendum)
Routine follow-up for an extensive lower torso rash on Antonio Clark date of birth 05-25-1936.  He diligently used a prescription cream with essentially complete clearing of the active rash.  The itching is gone.  There is some residual erythema on the lower stomach and noticeable postinflammatory hyperpigmentation on the lower back and the sides.  He can did not discontinue use of the cream but may restart it for a week if there is a mild flare.  For now no biopsy or patch testing is indicated.  The second issue we discussed today were some crusts on the right cheek right hand (2 and left forearm; these are most likely precancerous keratoses and were treated with 5-second liquid nitrogen freeze.  These will swell and may peel in the next 2 weeks.  There is no restriction or special care.  If the freezing fails, Antonio Clark will return in the fall or winter for biopsies.  Finally we discussed the little warty bumps that he is prone to get on the lower legs and feet that represent acro keratoses.  They tend to peel off with some topical when he gets a pedicure but they always recur.  I discouraged him from having these surgically removed or frozen or treated with any wart medicine.  If any of them grow or bleed I will recheck.  Routine follow-up as needed for melanoma follow up.

## 2020-03-07 DIAGNOSIS — M5416 Radiculopathy, lumbar region: Secondary | ICD-10-CM | POA: Diagnosis not present

## 2020-03-08 ENCOUNTER — Encounter: Payer: Self-pay | Admitting: Cardiology

## 2020-03-08 ENCOUNTER — Other Ambulatory Visit: Payer: Self-pay

## 2020-03-08 ENCOUNTER — Ambulatory Visit (INDEPENDENT_AMBULATORY_CARE_PROVIDER_SITE_OTHER): Payer: Medicare Other | Admitting: Cardiology

## 2020-03-08 VITALS — BP 120/80 | HR 100 | Ht 69.0 in | Wt 195.2 lb

## 2020-03-08 DIAGNOSIS — I10 Essential (primary) hypertension: Secondary | ICD-10-CM

## 2020-03-08 DIAGNOSIS — G4733 Obstructive sleep apnea (adult) (pediatric): Secondary | ICD-10-CM | POA: Diagnosis not present

## 2020-03-08 DIAGNOSIS — I480 Paroxysmal atrial fibrillation: Secondary | ICD-10-CM

## 2020-03-08 NOTE — Progress Notes (Signed)
Cardiology Office Note:    Date:  03/08/2020   ID:  Antonio Clark, DOB Nov 03, 1935, MRN [id number]  PCP:  Susy Frizzle, MD  [facility] Cardiologist:  Candee Furbish, MD  [facility] Electrophysiologist:  None   Referring MD: Susy Frizzle, MD     History of Present Illness:    Antonio Clark is a 84 y.o. male here for paroxysmal atrial fibrillation.  Symptomatic.  On Pradaxa.  Please see below for details.  No fevers chills nausea vomiting syncope bleeding.  Past Medical History:  Diagnosis Date   Arthritis    Atrial fibrillation ([place])    Back pain    Borderline diabetic    Dysrhythmia    a-fib   GERD (gastroesophageal reflux disease)    History of echocardiogram 07/ 07/ 2011   History of lithotripsy 1989   Hyperlipidemia    Hypertension    Melanoma ([place]) 05/01/2019   right chest wall (12/20)   Pre-diabetes    Prostate CA ([place]) 2010   Sleep apnea    uses CPAP nightly   Squamous cell carcinoma of skin 05/23/1992   bowens-left parietal scalp (CX35FU)   Squamous cell carcinoma of skin 03/14/2008   in situ-left upper outer forehead-medial ([id number])   Squamous cell carcinoma of skin 03/14/2008   in situ-crown of scalp ([id number])   Squamous cell carcinoma of skin 04/15/2011   in situ-right dorsal forearm (txpbx)   Squamous cell carcinoma of skin 10/16/2011   in situ-left sideburn   Squamous cell carcinoma of skin 03/11/2015   ka-left sideburn ([id number])   Squamous cell carcinoma of skin 03/11/2015   ka-left forearm ([id number])   Squamous cell carcinoma of skin 06/22/2018   in situ-left forearm, sup (txpbx)   Squamous cell carcinoma of skin 05/01/2019   in situ-mid anterior scalp    Squamous cell carcinoma of skin 05/01/2019   in situ-right upper arm    Past Surgical History:  Procedure Laterality Date   AXILLARY SENTINEL NODE BIOPSY Right 08/23/2019   Procedure: SENTINEL LYMPH NODE BIOSPY RIGHT AXILLA;  Surgeon: Stark Klein, MD;   Location: [place];  Service: General;  Laterality: Right;   COLONOSCOPY  2002   FASCIECTOMY Left 03/28/2019   Procedure: SEGMENTAL FASCIECTOMY LEFT RING FINGER;  Surgeon: Daryll Brod, MD;  Location: [place];  Service: Orthopedics;  Laterality: Left;  ANESTHESIA  AXILLARY BLOCK   INSERTION PROSTATE RADIATION SEED  8 4 2010   per Dr Rosana Hoes   MELANOMA EXCISION Right 06/22/2019   Procedure: WIDE LOCAL EXCISION RIGHT CHEST WALL MELANOMA, ADVANCEMENT FLAP CLOSURE FOR DEFECT 3X6 CM;  Surgeon: Stark Klein, MD;  Location: [place];  Service: General;  Laterality: Right;   POLYPECTOMY  2002   THROAT SURGERY  1980   benign cyst   URETHRAL STRICTURE DILATATION     also penile implant    Current Medications: Current Meds  Medication Sig   atorvastatin (LIPITOR) 40 MG tablet Take 1 tablet (40 mg total) by mouth daily. Please stop pravastatin   clobetasol ointment (TEMOVATE) 9.41 % Apply 1 application topically as needed (skin irritation).    hydrochlorothiazide (HYDRODIURIL) 25 MG tablet Take 1 tablet (25 mg total) by mouth daily.   hydrocortisone (ANUSOL-HC) 2.5 % rectal cream Apply 1 application topically 2 (two) times daily as needed.   LORazepam (ATIVAN) 1 MG tablet TAKE 1 TABLET TO 2 TABLETS 90 MINUTES PRIOR TO MRI   multivitamin (THERAGRAN) per tablet Take  1 tablet by mouth daily.     Omega-3 Fatty Acids (FISH OIL) 1200 MG CAPS Take 1,200 mg by mouth daily.     omeprazole (PRILOSEC) 20 MG capsule Take 20 mg by mouth as needed (heartburn).   PRADAXA 150 MG CAPS capsule TAKE 1 CAPSULE BY MOUTH TWICE A DAY   Tamsulosin HCl (FLOMAX) 0.4 MG CAPS Take 0.4 mg by mouth daily.     traMADol (ULTRAM) 50 MG tablet Take 1 tablet (50 mg total) by mouth every 8 (eight) hours as needed for moderate pain or severe pain.   valsartan (DIOVAN) 320 MG tablet TAKE 1 TABLET BY MOUTH EVERY DAY     Allergies:   Patient has no known allergies.    Social History   Socioeconomic History   Marital status: Single    Spouse name: Not on file   Number of children: Not on file   Years of education: Not on file   Highest education level: Not on file  Occupational History   Occupation: retired  Tobacco Use   Smoking status: Never Smoker   Smokeless tobacco: Never Used  Scientific laboratory technician Use: Never used  Substance and Sexual Activity   Alcohol use: Yes    Comment: social   Drug use: No   Sexual activity: Yes  Other Topics Concern   Not on file  Social History Narrative   Not on file   Social Determinants of Health   Financial Resource Strain:    Difficulty of Paying Living Expenses:   Food Insecurity:    Worried About Charity fundraiser in the Last Year:    Arboriculturist in the Last Year:   Transportation Needs:    Film/video editor (Medical):    Lack of Transportation (Non-Medical):   Physical Activity:    Days of Exercise per Week:    Minutes of Exercise per Session:   Stress:    Feeling of Stress :   Social Connections:    Frequency of Communication with Friends and Family:    Frequency of Social Gatherings with Friends and Family:    Attends Religious Services:    Active Member of Clubs or Organizations:    Attends Music therapist:    Marital Status:      Family History: The patient's family history includes Arrhythmia in his brother; Cancer in his mother; Colon cancer (age of onset: 15) in his mother; Coronary artery disease in his brother; Diabetes in his brother and brother; Hearing loss in his brother; Heart disease in his brother and father; Heart failure in his brother; Hyperlipidemia in his sister; Hypertension in his brother, brother, brother, and father; Stroke in his mother and sister; Sudden death in his father; Suicidality in his sister. There is no history of Esophageal cancer, Pancreatic cancer, Prostate cancer, Rectal cancer, or Stomach  cancer.  ROS:   Please see the history of present illness.     All other systems reviewed and are negative.  EKGs/Labs/Other Studies Reviewed:    The following studies were reviewed today:  Echocardiogram 10/2017 - Left ventricle: The cavity size was normal. There was mild focal basal hypertrophy of the septum. Systolic function was normal. The estimated ejection fraction was in the range of 50% to 55%. Wall motion was normal; there were no regional wall motion abnormalities. Features are consistent with a pseudonormal left ventricular filling pattern, with concomitant abnormal relaxation and increased filling pressure (grade 2 diastolic dysfunction). - Aortic  valve: Trileaflet; moderately thickened, mildly calcified leaflets. There was mild regurgitation. Regurgitation pressure half-time: 701 ms. - Aorta: Aortic root dimension: 38 mm (ED). - Aortic root: The aortic root was mildly dilated. - Mitral valve: Calcified annulus. There was trivial regurgitation. - Left atrium: The atrium was moderately dilated. - Right ventricle: The cavity size was mildly dilated. Wall thickness was normal. - Atrial septum: There was increased thickness of the septum, consistent with lipomatous hypertrophy. - Tricuspid valve: There was trivial regurgitation. - Pulmonic valve: There was trivial regurgitation.    EKG:  EKG is  ordered today.  The ekg ordered today demonstrates AFIB 100 bpm  Recent Labs: 08/14/2019: ALT 11; Hemoglobin 13.6; Platelets 140 08/18/2019: BUN 23; Creatinine, Ser 1.12; Potassium 4.6; Sodium 138  Recent Lipid Panel    Component Value Date/Time   CHOL 167 08/14/2019 0856   TRIG 184 (H) 08/14/2019 0856   HDL 45 08/14/2019 0856   CHOLHDL 3.7 08/14/2019 0856   VLDL 34 (H) 01/18/2017 0813   LDLCALC 94 08/14/2019 0856    Physical Exam:    VS:  BP 120/80    Pulse 100    Ht 5\' 9"  (1.753 m)    Wt 195 lb 3.2 oz (88.5 kg)    SpO2 95%    BMI 28.83 kg/m      Wt Readings from Last 3 Encounters:  03/08/20 195 lb 3.2 oz (88.5 kg)  10/02/19 197 lb 12.8 oz (89.7 kg)  08/23/19 194 lb 7.1 oz (88.2 kg)     GEN:  Well nourished, well developed in no acute distress HEENT: Normal NECK: No JVD; No carotid bruits LYMPHATICS: No lymphadenopathy CARDIAC: irreg irreg, no murmurs, rubs, gallops RESPIRATORY:  Clear to auscultation without rales, wheezing or rhonchi  ABDOMEN: Soft, non-tender, non-distended MUSCULOSKELETAL:  No edema; No deformity  SKIN: Warm and dry NEUROLOGIC:  Alert and oriented x 3 PSYCHIATRIC:  Normal affect   ASSESSMENT:    1. PAF (paroxysmal atrial fibrillation) ([place])   2. Essential hypertension   3. OSA (obstructive sleep apnea)    PLAN:    In order of problems listed above:  Atrial fibrillation paroxysmal -CHADS2-VASc score of  5 (HTN, AGE, DM2, CAD, AGE) -Back in 2019, had atrial fibrillation while in the urology office. -Heart rate currently 100.  Prior visit in February, he was in sinus rhythm with heart rate of 79 bpm. --No energy. BP 100. Symptomatic.  He feels as though he has been in atrial fibrillation over the past 2 to 3 weeks. -Overall fairly reasonable rate control right now however I do not have much blood pressure to play with.  At times at home his blood pressure will be 100.  I would like for him to discuss with EP further strategies.  Certainly we can always set him up for a cardioversion if necessary however would he be a potential ablation candidate.  Left atrial size is moderate on last echocardiogram.  I know age is a consideration.  Chronic anticoagulation -On Pradaxa.  May hold 4 days prior to procedure per protocol if necessary.  Obstructive sleep apnea -CPAP made him claustrophobic.  Diabetes with essential hypertension -Low-salt.  Exercising.  Back pain.  Strong family history of coronary artery disease -Continue with prevention strategy.  On atorvastatin 40 mg a day.  Melanoma  resection  - prior November, held Pradaxa.   Medication Adjustments/Labs and Tests Ordered: Current medicines are reviewed at length with the patient today.  Concerns regarding medicines are outlined above.  Orders Placed This Encounter  Procedures   Ambulatory referral to Cardiac Electrophysiology   EKG 12-Lead   No orders of the defined types were placed in this encounter.   Patient Instructions  Your physician recommends that you continue on your current medications as directed. Please refer to the Current Medication list given to you today.   You have been referred to EP [facility] ASAP  Your physician recommends that you schedule a follow-up appointment in: [place] NP    Signed, Candee Furbish, MD  03/08/2020 10:02 AM    [place]

## 2020-03-08 NOTE — Patient Instructions (Signed)
Your physician recommends that you continue on your current medications as directed. Please refer to the Current Medication list given to you today.   You have been referred to EP DR [facility] ASAP  Your physician recommends that you schedule a follow-up appointment in: [place] NP

## 2020-03-24 NOTE — H&P (View-Only) (Signed)
Cardiology Office Note:    Date:  03/27/2020   ID:  NYMIR RINGLER, DOB 25-Sep-1935, MRN [id number]  PCP:  Susy Frizzle, MD  [facility] Cardiologist:  Candee Furbish, MD  [facility] Electrophysiologist:  Vickie Epley, MD   Referring MD: Jerline Pain, MD   Symptomatic atrial fibrillation  History of Present Illness:    Antonio Clark is a 84 y.o. male with a hx of hypertension, diabetes, hyperlipidemia, obstructive sleep apnea and paroxysmal atrial fibrillation presents to the clinic for evaluation of his symptomatic atrial fibrillation.  He was last seen in clinic March 08, 2020 by Dr. Marlou Porch who noted the patient to be in atrial fibrillation with uncontrolled ventricular rates.  The patient at that time believed that he was in atrial fibrillation for at least 2 to 3 weeks.  Blood pressure limits medical therapy for his A. Fib.   Mr. Cerone tells me that he is overall very active and goes to the [facility] to exercise routinely.  He used to walk several miles a day but stopped doing this due to some back discomfort.  [facility]  Past Medical History:  Diagnosis Date  . Arthritis   . Atrial fibrillation ([facility])   . Back pain   . Borderline diabetic   . Dysrhythmia    a-fib  . GERD (gastroesophageal reflux disease)   . History of echocardiogram 07/ 07/ 2011  . History of lithotripsy 1989  . Hyperlipidemia   . Hypertension   . Melanoma ([place]) 05/01/2019   right chest wall (12/20)  . Pre-diabetes   . Prostate CA ([place]) 2010  . Sleep apnea    uses CPAP nightly  . Squamous cell carcinoma of skin 05/23/1992   bowens-left parietal scalp (CX35FU)  . Squamous cell carcinoma of skin 03/14/2008   in situ-left upper outer forehead-medial (CX35FU)  . Squamous cell carcinoma of skin 03/14/2008   in situ-crown of scalp (Cx35FU)  . Squamous cell carcinoma of skin 04/15/2011   in situ-right dorsal forearm (txpbx)  . Squamous cell carcinoma of  skin 10/16/2011   in situ-left sideburn  . Squamous cell carcinoma of skin 03/11/2015   ka-left sideburn (CX35FU)  . Squamous cell carcinoma of skin 03/11/2015   ka-left forearm (CX35FU)  . Squamous cell carcinoma of skin 06/22/2018   in situ-left forearm, sup (txpbx)  . Squamous cell carcinoma of skin 05/01/2019   in situ-mid anterior scalp   . Squamous cell carcinoma of skin 05/01/2019   in situ-right upper arm    Past Surgical History:  Procedure Laterality Date  . AXILLARY SENTINEL NODE BIOPSY Right 08/23/2019   Procedure: SENTINEL LYMPH NODE BIOSPY RIGHT AXILLA;  Surgeon: Stark Klein, MD;  Location: [place];  Service: General;  Laterality: Right;  . COLONOSCOPY  2002  . FASCIECTOMY Left 03/28/2019   Procedure: SEGMENTAL FASCIECTOMY LEFT RING FINGER;  Surgeon: Daryll Brod, MD;  Location: [place];  Service: Orthopedics;  Laterality: Left;  ANESTHESIA  AXILLARY BLOCK  . INSERTION PROSTATE RADIATION SEED  8 4 2010   per Dr Rosana Hoes  . MELANOMA EXCISION Right 06/22/2019   Procedure: WIDE LOCAL EXCISION RIGHT CHEST WALL MELANOMA, ADVANCEMENT FLAP CLOSURE FOR DEFECT 3X6 CM;  Surgeon: Stark Klein, MD;  Location: [place];  Service: General;  Laterality: Right;  . POLYPECTOMY  2002  . THROAT SURGERY  1980   benign cyst  . URETHRAL STRICTURE DILATATION  also penile implant    Current Medications: Current Meds  Medication Sig  . atorvastatin (LIPITOR) 40 MG tablet Take 1 tablet (40 mg total) by mouth daily. Please stop pravastatin  . clobetasol ointment (TEMOVATE) 9.37 % Apply 1 application topically as needed (skin irritation).   . hydrocortisone (ANUSOL-HC) 2.5 % rectal cream Apply 1 application topically 2 (two) times daily as needed.  Marland Kitchen LORazepam (ATIVAN) 1 MG tablet TAKE 1 TABLET TO 2 TABLETS 90 MINUTES PRIOR TO MRI  . multivitamin (THERAGRAN) per tablet Take 1 tablet by mouth daily.    . Omega-3 Fatty Acids (FISH OIL) 1200  MG CAPS Take 1,200 mg by mouth daily.    Marland Kitchen omeprazole (PRILOSEC) 20 MG capsule Take 20 mg by mouth as needed (heartburn).  Marland Kitchen PRADAXA 150 MG CAPS capsule TAKE 1 CAPSULE BY MOUTH TWICE A DAY  . Tamsulosin HCl (FLOMAX) 0.4 MG CAPS Take 0.4 mg by mouth daily.    . valsartan (DIOVAN) 320 MG tablet TAKE 1 TABLET BY MOUTH EVERY DAY     Allergies:   Patient has no known allergies.   Social History   Socioeconomic History  . Marital status: Single    Spouse name: Not on file  . Number of children: Not on file  . Years of education: Not on file  . Highest education level: Not on file  Occupational History  . Occupation: retired  Tobacco Use  . Smoking status: Never Smoker  . Smokeless tobacco: Never Used  Vaping Use  . Vaping Use: Never used  Substance and Sexual Activity  . Alcohol use: Yes    Comment: social  . Drug use: No  . Sexual activity: Yes  Other Topics Concern  . Not on file  Social History Narrative  . Not on file   Social Determinants of Health   Financial Resource Strain:   . Difficulty of Paying Living Expenses:   Food Insecurity:   . Worried About Charity fundraiser in the Last Year:   . Arboriculturist in the Last Year:   Transportation Needs:   . Film/video editor (Medical):   Marland Kitchen Lack of Transportation (Non-Medical):   Physical Activity:   . Days of Exercise per Week:   . Minutes of Exercise per Session:   Stress:   . Feeling of Stress :   Social Connections:   . Frequency of Communication with Friends and Family:   . Frequency of Social Gatherings with Friends and Family:   . Attends Religious Services:   . Active Member of Clubs or Organizations:   . Attends Archivist Meetings:   Marland Kitchen Marital Status:      Family History: The patient's family history includes Arrhythmia in his brother; Cancer in his mother; Colon cancer (age of onset: 55) in his mother; Coronary artery disease in his brother; Diabetes in his brother and brother; Hearing  loss in his brother; Heart disease in his brother and father; Heart failure in his brother; Hyperlipidemia in his sister; Hypertension in his brother, brother, brother, and father; Stroke in his mother and sister; Sudden death in his father; Suicidality in his sister. There is no history of Esophageal cancer, Pancreatic cancer, Prostate cancer, Rectal cancer, or Stomach cancer.  ROS:   Please see the history of present illness.     All other systems reviewed and are negative.  EKGs/Labs/Other Studies Reviewed:    The following studies were reviewed today: Stress test and echo and ECG  Echocardiogram from November 05, 2017 shows a left ventricular function of 50%.  Mild aortic regurgitation.  Lipomatous hypertrophy of the interatrial septum, moderately dilated left atrium.  EKG from July 30 reviewed and demonstrates atrial fibrillation with rapid ventricular response.  PVC originating from the outflow tract.  EKG from March 24, 2019 shows normal sinus rhythm with right bundle branch block.  EKG from October 29, 2017 shows atrial flutter with 2-1 AV conduction and bigeminal right bundle conduction.  EKG from September 09, 2017 shows normal sinus rhythm.  Recent Labs: 08/14/2019: ALT 11; Hemoglobin 13.6; Platelets 140 08/18/2019: BUN 23; Creatinine, Ser 1.12; Potassium 4.6; Sodium 138  Recent Lipid Panel    Component Value Date/Time   CHOL 167 08/14/2019 0856   TRIG 184 (H) 08/14/2019 0856   HDL 45 08/14/2019 0856   CHOLHDL 3.7 08/14/2019 0856   VLDL 34 (H) 01/18/2017 0813   LDLCALC 94 08/14/2019 0856    Physical Exam:    VS:  BP 122/70   Pulse 94   Ht 5\' 9"  (1.753 m)   Wt 198 lb 6.4 oz (90 kg)   SpO2 95%   BMI 29.30 kg/m     Wt Readings from Last 3 Encounters:  03/27/20 198 lb 6.4 oz (90 kg)  03/08/20 195 lb 3.2 oz (88.5 kg)  10/02/19 197 lb 12.8 oz (89.7 kg)     GEN:  Well nourished, well developed in no acute distress HEENT: Normal NECK: No JVD; No carotid  bruits LYMPHATICS: No lymphadenopathy CARDIAC: Irregularly irregular, no murmurs, rubs, gallops RESPIRATORY:  Clear to auscultation without rales, wheezing or rhonchi  ABDOMEN: Soft, non-tender, non-distended MUSCULOSKELETAL:  No edema; No deformity  SKIN: Warm and dry NEUROLOGIC:  Alert and oriented x 3 PSYCHIATRIC:  Normal affect   ASSESSMENT:    1. Persistent atrial fibrillation ([place])   2. Atrial flutter, unspecified type ([place])   3. OSA (obstructive sleep apnea)   4. Essential hypertension    PLAN:    In order of problems listed above:  1. Persistent atrial fibrillation and atrial flutter First diagnosed in 2019.  On Pradaxa for stroke prevention.  I suspect the overall burden of atrial fibrillation flutter has been increasing since his initial diagnosis.  Left atrium is moderately dilated and the patient has sleep apnea which is also likely contributing to his arrhythmia burden. CHA2DS2-VASc of 3 for age and hypertension. Discussed the options for management of his symptomatic atrial fibrillation flutter including antiarrhythmic therapy and ablation. I do think he is a reasonable candidate for ablation given the relatively paroxysmal nature of his arrhythmias since first diagnosis in 2019.  The procedural strategy will be a PVI and posterior wall ablation.  We will also perform a CTI line.  2.  Obstructive sleep apnea Discussed the importance of adherence to CPAP therapy and its relationship to A. fib recurrence.   Risk, benefits, and alternatives to EP study and radiofrequency ablation for afib were also discussed in detail today. These risks include but are not limited to stroke, bleeding, vascular damage, tamponade, perforation, damage to the esophagus, lungs, and other structures, pulmonary vein stenosis, worsening renal function, and death. The patient understands these risk and wishes to proceed.  We will therefore proceed with catheter ablation at the next available time.   Carto, ICE, anesthesia are requested for the procedure.    Medication Adjustments/Labs and Tests Ordered: Current medicines are reviewed at length with the patient today.  Concerns regarding medicines are outlined above.  Orders  Placed This Encounter  Procedures  . Basic Metabolic Panel (BMET)  . CBC w/Diff  . EKG 12-Lead   No orders of the defined types were placed in this encounter.   Patient Instructions  Medication Instructions:  Your physician recommends that you continue on your current medications as directed. Please refer to the Current Medication list given to you today.  Labwork: You will get lab work today:  BMP and CBC  Testing/Procedures: None ordered.  Follow-Up:  SEE INSTRUCTION LETTER  Any Other Special Instructions Will Be Listed Below (If Applicable).  If you need a refill on your cardiac medications before your next appointment, please call your pharmacy.    Cardiac Ablation Cardiac ablation is a procedure to disable (ablate) a small amount of heart tissue in very specific places. The heart has many electrical connections. Sometimes these connections are abnormal and can cause the heart to beat very fast or irregularly. Ablating some of the problem areas can improve the heart rhythm or return it to normal. Ablation may be done for people who:  Have Wolff-Parkinson-Behan syndrome.  Have fast heart rhythms (tachycardia).  Have taken medicines for an abnormal heart rhythm (arrhythmia) that were not effective or caused side effects.  Have a high-risk heartbeat that may be life-threatening. During the procedure, a small incision is made in the neck or the groin, and a long, thin, flexible tube (catheter) is inserted into the incision and moved to the heart. Small devices (electrodes) on the tip of the catheter will send out electrical currents. A type of X-ray (fluoroscopy) will be used to help guide the catheter and to provide images of the heart. Tell a  health care provider about:  Any allergies you have.  All medicines you are taking, including vitamins, herbs, eye drops, creams, and over-the-counter medicines.  Any problems you or family members have had with anesthetic medicines.  Any blood disorders you have.  Any surgeries you have had.  Any medical conditions you have, such as kidney failure.  Whether you are pregnant or may be pregnant. What are the risks? Generally, this is a safe procedure. However, problems may occur, including:  Infection.  Bruising and bleeding at the catheter insertion site.  Bleeding into the chest, especially into the sac that surrounds the heart. This is a serious complication.  Stroke or blood clots.  Damage to other structures or organs.  Allergic reaction to medicines or dyes.  Need for a permanent pacemaker if the normal electrical system is damaged. A pacemaker is a small computer that sends electrical signals to the heart and helps your heart beat normally.  The procedure not being fully effective. This may not be recognized until months later. Repeat ablation procedures are sometimes required. What happens before the procedure?  Follow instructions from your health care provider about eating or drinking restrictions.  Ask your health care provider about: ? Changing or stopping your regular medicines. This is especially important if you are taking diabetes medicines or blood thinners. ? Taking medicines such as aspirin and ibuprofen. These medicines can thin your blood. Do not take these medicines before your procedure if your health care provider instructs you not to.  Plan to have someone take you home from the hospital or clinic.  If you will be going home right after the procedure, plan to have someone with you for 24 hours. What happens during the procedure?  To lower your risk of infection: ? Your health care team will wash or sanitize  their hands. ? Your skin will be  washed with soap. ? Hair may be removed from the incision area.  An IV tube will be inserted into one of your veins.  You will be given a medicine to help you relax (sedative).  The skin on your neck or groin will be numbed.  An incision will be made in your neck or your groin.  A needle will be inserted through the incision and into a large vein in your neck or groin.  A catheter will be inserted into the needle and moved to your heart.  Dye may be injected through the catheter to help your surgeon see the area of the heart that needs treatment.  Electrical currents will be sent from the catheter to ablate heart tissue in desired areas. There are three types of energy that may be used to ablate heart tissue: ? Heat (radiofrequency energy). ? Laser energy. ? Extreme cold (cryoablation).  When the necessary tissue has been ablated, the catheter will be removed.  Pressure will be held on the catheter insertion area to prevent excessive bleeding.  A bandage (dressing) will be placed over the catheter insertion area. The procedure may vary among health care providers and hospitals. What happens after the procedure?  Your blood pressure, heart rate, breathing rate, and blood oxygen level will be monitored until the medicines you were given have worn off.  Your catheter insertion area will be monitored for bleeding. You will need to lie still for a few hours to ensure that you do not bleed from the catheter insertion area.  Do not drive for 24 hours or as long as directed by your health care provider. Summary  Cardiac ablation is a procedure to disable (ablate) a small amount of heart tissue in very specific places. Ablating some of the problem areas can improve the heart rhythm or return it to normal.  During the procedure, electrical currents will be sent from the catheter to ablate heart tissue in desired areas. This information is not intended to replace advice given to you by  your health care provider. Make sure you discuss any questions you have with your health care provider. Document Revised: 01/17/2018 Document Reviewed: 06/15/2016 Elsevier Patient Education  2020 [place], Vickie Epley, MD  03/27/2020 7:00 PM    [place]

## 2020-03-24 NOTE — Progress Notes (Signed)
Cardiology Office Note:    Date:  03/27/2020   ID:  Antonio Clark, DOB [birth year], MRN [id number]  PCP:  Susy Frizzle, MD  [facility] Cardiologist:  Candee Furbish, MD  [facility] Electrophysiologist:  Vickie Epley, MD   Referring MD: Jerline Pain, MD   Symptomatic atrial fibrillation  History of Present Illness:    Antonio Clark is a 84 y.o. male with a hx of hypertension, diabetes, hyperlipidemia, obstructive sleep apnea and paroxysmal atrial fibrillation presents to the clinic for evaluation of his symptomatic atrial fibrillation.  He was last seen in clinic March 08, 2020 by Dr. Marlou Porch who noted the patient to be in atrial fibrillation with uncontrolled ventricular rates.  The patient at that time believed that he was in atrial fibrillation for at least 2 to 3 weeks.  Blood pressure limits medical therapy for his A. Fib.   [name] tells me that he is overall very active and goes to the [facility] to exercise routinely.  He used to walk several miles a day but stopped doing this due to some back discomfort.  [facility]  Past Medical History:  Diagnosis Date  . Arthritis   . Atrial fibrillation ([place])   . Back pain   . Borderline diabetic   . Dysrhythmia    a-fib  . GERD (gastroesophageal reflux disease)   . History of echocardiogram 07/ 07/ 2011  . History of lithotripsy 1989  . Hyperlipidemia   . Hypertension   . Melanoma ([place]) 05/01/2019   right chest wall (12/20)  . Pre-diabetes   . Prostate CA ([place]) 2010  . Sleep apnea    uses CPAP nightly  . Squamous cell carcinoma of skin 05/23/1992   bowens-left parietal scalp (CX35FU)  . Squamous cell carcinoma of skin 03/14/2008   in situ-left upper outer forehead-medial (CX35FU)  . Squamous cell carcinoma of skin 03/14/2008   in situ-crown of scalp (Cx35FU)  . Squamous cell carcinoma of skin 04/15/2011   in situ-right dorsal forearm (txpbx)  . Squamous cell carcinoma of  skin 10/16/2011   in situ-left sideburn  . Squamous cell carcinoma of skin 03/11/2015   ka-left sideburn (CX35FU)  . Squamous cell carcinoma of skin 03/11/2015   ka-left forearm (CX35FU)  . Squamous cell carcinoma of skin 06/22/2018   in situ-left forearm, sup (txpbx)  . Squamous cell carcinoma of skin 05/01/2019   in situ-mid anterior scalp   . Squamous cell carcinoma of skin 05/01/2019   in situ-right upper arm    Past Surgical History:  Procedure Laterality Date  . AXILLARY SENTINEL NODE BIOPSY Right 08/23/2019   Procedure: SENTINEL LYMPH NODE BIOSPY RIGHT AXILLA;  Surgeon: Stark Klein, MD;  Location: [place];  Service: General;  Laterality: Right;  . COLONOSCOPY  2002  . FASCIECTOMY Left 03/28/2019   Procedure: SEGMENTAL FASCIECTOMY LEFT RING FINGER;  Surgeon: Daryll Brod, MD;  Location: [place];  Service: Orthopedics;  Laterality: Left;  ANESTHESIA  AXILLARY BLOCK  . INSERTION PROSTATE RADIATION SEED  8 4 2010   per Dr Rosana Hoes  . MELANOMA EXCISION Right 06/22/2019   Procedure: WIDE LOCAL EXCISION RIGHT CHEST WALL MELANOMA, ADVANCEMENT FLAP CLOSURE FOR DEFECT 3X6 CM;  Surgeon: Stark Klein, MD;  Location: [place];  Service: General;  Laterality: Right;  . POLYPECTOMY  2002  . THROAT SURGERY  1980   benign cyst  . URETHRAL STRICTURE DILATATION  also penile implant    Current Medications: Current Meds  Medication Sig  . atorvastatin (LIPITOR) 40 MG tablet Take 1 tablet (40 mg total) by mouth daily. Please stop pravastatin  . clobetasol ointment (TEMOVATE) 0.25 % Apply 1 application topically as needed (skin irritation).   . hydrocortisone (ANUSOL-HC) 2.5 % rectal cream Apply 1 application topically 2 (two) times daily as needed.  Marland Kitchen LORazepam (ATIVAN) 1 MG tablet TAKE 1 TABLET TO 2 TABLETS 90 MINUTES PRIOR TO MRI  . multivitamin (THERAGRAN) per tablet Take 1 tablet by mouth daily.    . Omega-3 Fatty Acids (FISH OIL) 1200  MG CAPS Take 1,200 mg by mouth daily.    Marland Kitchen omeprazole (PRILOSEC) 20 MG capsule Take 20 mg by mouth as needed (heartburn).  Marland Kitchen PRADAXA 150 MG CAPS capsule TAKE 1 CAPSULE BY MOUTH TWICE A DAY  . Tamsulosin HCl (FLOMAX) 0.4 MG CAPS Take 0.4 mg by mouth daily.    . valsartan (DIOVAN) 320 MG tablet TAKE 1 TABLET BY MOUTH EVERY DAY     Allergies:   Patient has no known allergies.   Social History   Socioeconomic History  . Marital status: Single    Spouse name: Not on file  . Number of children: Not on file  . Years of education: Not on file  . Highest education level: Not on file  Occupational History  . Occupation: retired  Tobacco Use  . Smoking status: Never Smoker  . Smokeless tobacco: Never Used  Vaping Use  . Vaping Use: Never used  Substance and Sexual Activity  . Alcohol use: Yes    Comment: social  . Drug use: No  . Sexual activity: Yes  Other Topics Concern  . Not on file  Social History Narrative  . Not on file   Social Determinants of Health   Financial Resource Strain:   . Difficulty of Paying Living Expenses:   Food Insecurity:   . Worried About Charity fundraiser in the Last Year:   . Arboriculturist in the Last Year:   Transportation Needs:   . Film/video editor (Medical):   Marland Kitchen Lack of Transportation (Non-Medical):   Physical Activity:   . Days of Exercise per Week:   . Minutes of Exercise per Session:   Stress:   . Feeling of Stress :   Social Connections:   . Frequency of Communication with Friends and Family:   . Frequency of Social Gatherings with Friends and Family:   . Attends Religious Services:   . Active Member of Clubs or Organizations:   . Attends Archivist Meetings:   Marland Kitchen Marital Status:      Family History: The patient's family history includes Arrhythmia in his brother; Cancer in his mother; Colon cancer (age of onset: 83) in his mother; Coronary artery disease in his brother; Diabetes in his brother and brother; Hearing  loss in his brother; Heart disease in his brother and father; Heart failure in his brother; Hyperlipidemia in his sister; Hypertension in his brother, brother, brother, and father; Stroke in his mother and sister; Sudden death in his father; Suicidality in his sister. There is no history of Esophageal cancer, Pancreatic cancer, Prostate cancer, Rectal cancer, or Stomach cancer.  ROS:   Please see the history of present illness.     All other systems reviewed and are negative.  EKGs/Labs/Other Studies Reviewed:    The following studies were reviewed today: Stress test and echo and ECG  Echocardiogram from November 05, 2017 shows a left ventricular function of 50%.  Mild aortic regurgitation.  Lipomatous hypertrophy of the interatrial septum, moderately dilated left atrium.  EKG from July 30 reviewed and demonstrates atrial fibrillation with rapid ventricular response.  PVC originating from the outflow tract.  EKG from March 24, 2019 shows normal sinus rhythm with right bundle branch block.  EKG from October 29, 2017 shows atrial flutter with 2-1 AV conduction and bigeminal right bundle conduction.  EKG from September 09, 2017 shows normal sinus rhythm.  Recent Labs: 08/14/2019: ALT 11; Hemoglobin 13.6; Platelets 140 08/18/2019: BUN 23; Creatinine, Ser 1.12; Potassium 4.6; Sodium 138  Recent Lipid Panel    Component Value Date/Time   CHOL 167 08/14/2019 0856   TRIG 184 (H) 08/14/2019 0856   HDL 45 08/14/2019 0856   CHOLHDL 3.7 08/14/2019 0856   VLDL 34 (H) 01/18/2017 0813   LDLCALC 94 08/14/2019 0856    Physical Exam:    VS:  BP 122/70   Pulse 94   Ht 5\' 9"  (1.753 m)   Wt 198 lb 6.4 oz (90 kg)   SpO2 95%   BMI 29.30 kg/m     Wt Readings from Last 3 Encounters:  03/27/20 198 lb 6.4 oz (90 kg)  03/08/20 195 lb 3.2 oz (88.5 kg)  10/02/19 197 lb 12.8 oz (89.7 kg)     GEN:  Well nourished, well developed in no acute distress HEENT: Normal NECK: No JVD; No carotid  bruits LYMPHATICS: No lymphadenopathy CARDIAC: Irregularly irregular, no murmurs, rubs, gallops RESPIRATORY:  Clear to auscultation without rales, wheezing or rhonchi  ABDOMEN: Soft, non-tender, non-distended MUSCULOSKELETAL:  No edema; No deformity  SKIN: Warm and dry NEUROLOGIC:  Alert and oriented x 3 PSYCHIATRIC:  Normal affect   ASSESSMENT:    1. Persistent atrial fibrillation ([place])   2. Atrial flutter, unspecified type ([place])   3. OSA (obstructive sleep apnea)   4. Essential hypertension    PLAN:    In order of problems listed above:  1. Persistent atrial fibrillation and atrial flutter First diagnosed in 2019.  On Pradaxa for stroke prevention.  I suspect the overall burden of atrial fibrillation flutter has been increasing since his initial diagnosis.  Left atrium is moderately dilated and the patient has sleep apnea which is also likely contributing to his arrhythmia burden. CHA2DS2-VASc of 3 for age and hypertension. Discussed the options for management of his symptomatic atrial fibrillation flutter including antiarrhythmic therapy and ablation. I do think he is a reasonable candidate for ablation given the relatively paroxysmal nature of his arrhythmias since first diagnosis in 2019.  The procedural strategy will be a PVI and posterior wall ablation.  We will also perform a CTI line.  2.  Obstructive sleep apnea Discussed the importance of adherence to CPAP therapy and its relationship to A. fib recurrence.   Risk, benefits, and alternatives to EP study and radiofrequency ablation for afib were also discussed in detail today. These risks include but are not limited to stroke, bleeding, vascular damage, tamponade, perforation, damage to the esophagus, lungs, and other structures, pulmonary vein stenosis, worsening renal function, and death. The patient understands these risk and wishes to proceed.  We will therefore proceed with catheter ablation at the next available time.   Carto, ICE, anesthesia are requested for the procedure.    Medication Adjustments/Labs and Tests Ordered: Current medicines are reviewed at length with the patient today.  Concerns regarding medicines are outlined above.  Orders  Placed This Encounter  Procedures  . Basic Metabolic Panel (BMET)  . CBC w/Diff  . EKG 12-Lead   No orders of the defined types were placed in this encounter.   Patient Instructions  Medication Instructions:  Your physician recommends that you continue on your current medications as directed. Please refer to the Current Medication list given to you today.  Labwork: You will get lab work today:  BMP and CBC  Testing/Procedures: None ordered.  Follow-Up:  SEE INSTRUCTION LETTER  Any Other Special Instructions Will Be Listed Below (If Applicable).  If you need a refill on your cardiac medications before your next appointment, please call your pharmacy.    Cardiac Ablation Cardiac ablation is a procedure to disable (ablate) a small amount of heart tissue in very specific places. The heart has many electrical connections. Sometimes these connections are abnormal and can cause the heart to beat very fast or irregularly. Ablating some of the problem areas can improve the heart rhythm or return it to normal. Ablation may be done for people who:  Have Wolff-Parkinson-Elsbernd syndrome.  Have fast heart rhythms (tachycardia).  Have taken medicines for an abnormal heart rhythm (arrhythmia) that were not effective or caused side effects.  Have a high-risk heartbeat that may be life-threatening. During the procedure, a small incision is made in the neck or the groin, and a long, thin, flexible tube (catheter) is inserted into the incision and moved to the heart. Small devices (electrodes) on the tip of the catheter will send out electrical currents. A type of X-ray (fluoroscopy) will be used to help guide the catheter and to provide images of the heart. Tell a  health care provider about:  Any allergies you have.  All medicines you are taking, including vitamins, herbs, eye drops, creams, and over-the-counter medicines.  Any problems you or family members have had with anesthetic medicines.  Any blood disorders you have.  Any surgeries you have had.  Any medical conditions you have, such as kidney failure.  Whether you are pregnant or may be pregnant. What are the risks? Generally, this is a safe procedure. However, problems may occur, including:  Infection.  Bruising and bleeding at the catheter insertion site.  Bleeding into the chest, especially into the sac that surrounds the heart. This is a serious complication.  Stroke or blood clots.  Damage to other structures or organs.  Allergic reaction to medicines or dyes.  Need for a permanent pacemaker if the normal electrical system is damaged. A pacemaker is a small computer that sends electrical signals to the heart and helps your heart beat normally.  The procedure not being fully effective. This may not be recognized until months later. Repeat ablation procedures are sometimes required. What happens before the procedure?  Follow instructions from your health care provider about eating or drinking restrictions.  Ask your health care provider about: ? Changing or stopping your regular medicines. This is especially important if you are taking diabetes medicines or blood thinners. ? Taking medicines such as aspirin and ibuprofen. These medicines can thin your blood. Do not take these medicines before your procedure if your health care provider instructs you not to.  Plan to have someone take you home from the hospital or clinic.  If you will be going home right after the procedure, plan to have someone with you for 24 hours. What happens during the procedure?  To lower your risk of infection: ? Your health care team will wash or sanitize  their hands. ? Your skin will be  washed with soap. ? Hair may be removed from the incision area.  An IV tube will be inserted into one of your veins.  You will be given a medicine to help you relax (sedative).  The skin on your neck or groin will be numbed.  An incision will be made in your neck or your groin.  A needle will be inserted through the incision and into a large vein in your neck or groin.  A catheter will be inserted into the needle and moved to your heart.  Dye may be injected through the catheter to help your surgeon see the area of the heart that needs treatment.  Electrical currents will be sent from the catheter to ablate heart tissue in desired areas. There are three types of energy that may be used to ablate heart tissue: ? Heat (radiofrequency energy). ? Laser energy. ? Extreme cold (cryoablation).  When the necessary tissue has been ablated, the catheter will be removed.  Pressure will be held on the catheter insertion area to prevent excessive bleeding.  A bandage (dressing) will be placed over the catheter insertion area. The procedure may vary among health care providers and hospitals. What happens after the procedure?  Your blood pressure, heart rate, breathing rate, and blood oxygen level will be monitored until the medicines you were given have worn off.  Your catheter insertion area will be monitored for bleeding. You will need to lie still for a few hours to ensure that you do not bleed from the catheter insertion area.  Do not drive for 24 hours or as long as directed by your health care provider. Summary  Cardiac ablation is a procedure to disable (ablate) a small amount of heart tissue in very specific places. Ablating some of the problem areas can improve the heart rhythm or return it to normal.  During the procedure, electrical currents will be sent from the catheter to ablate heart tissue in desired areas. This information is not intended to replace advice given to you by  your health care provider. Make sure you discuss any questions you have with your health care provider. Document Revised: 01/17/2018 Document Reviewed: 06/15/2016 Elsevier Patient Education  2020 [place], Vickie Epley, MD  03/27/2020 7:00 PM    [place]

## 2020-03-27 ENCOUNTER — Encounter: Payer: Self-pay | Admitting: Cardiology

## 2020-03-27 ENCOUNTER — Ambulatory Visit (INDEPENDENT_AMBULATORY_CARE_PROVIDER_SITE_OTHER): Payer: Medicare Other | Admitting: Cardiology

## 2020-03-27 ENCOUNTER — Other Ambulatory Visit: Payer: Self-pay

## 2020-03-27 ENCOUNTER — Telehealth: Payer: Self-pay | Admitting: Family Medicine

## 2020-03-27 VITALS — BP 122/70 | HR 94 | Ht 69.0 in | Wt 198.4 lb

## 2020-03-27 DIAGNOSIS — I4819 Other persistent atrial fibrillation: Secondary | ICD-10-CM | POA: Diagnosis not present

## 2020-03-27 DIAGNOSIS — I4892 Unspecified atrial flutter: Secondary | ICD-10-CM | POA: Diagnosis not present

## 2020-03-27 DIAGNOSIS — G4733 Obstructive sleep apnea (adult) (pediatric): Secondary | ICD-10-CM

## 2020-03-27 DIAGNOSIS — I10 Essential (primary) hypertension: Secondary | ICD-10-CM | POA: Diagnosis not present

## 2020-03-27 NOTE — Progress Notes (Signed)
  Chronic Care Management   Note  03/27/2020 Name: Antonio Clark MRN: [id number] DOB: 1936-02-15  Antonio Clark is a 84 y.o. year old male who is a primary care patient of Susy Frizzle, MD. I reached out to [name] by phone today in response to a referral sent by [name]'s PCP, Susy Frizzle, MD.   [name] was given information about Chronic Care Management services today including:  1. CCM service includes personalized support from designated clinical staff supervised by his physician, including individualized plan of care and coordination with other care providers 2. 24/7 contact phone numbers for assistance for urgent and routine care needs. 3. Service will only be billed when office clinical staff spend 20 minutes or more in a month to coordinate care. 4. Only one practitioner may furnish and bill the service in a calendar month. 5. The patient may stop CCM services at any time (effective at the end of the month) by phone call to the office staff.   Patient agreed to services and verbal consent obtained.   Follow up plan:   Carley Perdue [employer] Scheduler

## 2020-03-27 NOTE — Patient Instructions (Addendum)
Medication Instructions:  Your physician recommends that you continue on your current medications as directed. Please refer to the Current Medication list given to you today.  Labwork: You will get lab work today:  BMP and CBC  Testing/Procedures: None ordered.  Follow-Up:  SEE INSTRUCTION LETTER  Any Other Special Instructions Will Be Listed Below (If Applicable).  If you need a refill on your cardiac medications before your next appointment, please call your pharmacy.    Cardiac Ablation Cardiac ablation is a procedure to disable (ablate) a small amount of heart tissue in very specific places. The heart has many electrical connections. Sometimes these connections are abnormal and can cause the heart to beat very fast or irregularly. Ablating some of the problem areas can improve the heart rhythm or return it to normal. Ablation may be done for people who:  Have Wolff-Parkinson-Siefert syndrome.  Have fast heart rhythms (tachycardia).  Have taken medicines for an abnormal heart rhythm (arrhythmia) that were not effective or caused side effects.  Have a high-risk heartbeat that may be life-threatening. During the procedure, a small incision is made in the neck or the groin, and a long, thin, flexible tube (catheter) is inserted into the incision and moved to the heart. Small devices (electrodes) on the tip of the catheter will send out electrical currents. A type of X-ray (fluoroscopy) will be used to help guide the catheter and to provide images of the heart. Tell a health care provider about:  Any allergies you have.  All medicines you are taking, including vitamins, herbs, eye drops, creams, and over-the-counter medicines.  Any problems you or family members have had with anesthetic medicines.  Any blood disorders you have.  Any surgeries you have had.  Any medical conditions you have, such as kidney failure.  Whether you are pregnant or may be pregnant. What are the  risks? Generally, this is a safe procedure. However, problems may occur, including:  Infection.  Bruising and bleeding at the catheter insertion site.  Bleeding into the chest, especially into the sac that surrounds the heart. This is a serious complication.  Stroke or blood clots.  Damage to other structures or organs.  Allergic reaction to medicines or dyes.  Need for a permanent pacemaker if the normal electrical system is damaged. A pacemaker is a small computer that sends electrical signals to the heart and helps your heart beat normally.  The procedure not being fully effective. This may not be recognized until months later. Repeat ablation procedures are sometimes required. What happens before the procedure?  Follow instructions from your health care provider about eating or drinking restrictions.  Ask your health care provider about: ? Changing or stopping your regular medicines. This is especially important if you are taking diabetes medicines or blood thinners. ? Taking medicines such as aspirin and ibuprofen. These medicines can thin your blood. Do not take these medicines before your procedure if your health care provider instructs you not to.  Plan to have someone take you home from the hospital or clinic.  If you will be going home right after the procedure, plan to have someone with you for 24 hours. What happens during the procedure?  To lower your risk of infection: ? Your health care team will wash or sanitize their hands. ? Your skin will be washed with soap. ? Hair may be removed from the incision area.  An IV tube will be inserted into one of your veins.  You will be given a   medicine to help you relax (sedative).  The skin on your neck or groin will be numbed.  An incision will be made in your neck or your groin.  A needle will be inserted through the incision and into a large vein in your neck or groin.  A catheter will be inserted into the needle  and moved to your heart.  Dye may be injected through the catheter to help your surgeon see the area of the heart that needs treatment.  Electrical currents will be sent from the catheter to ablate heart tissue in desired areas. There are three types of energy that may be used to ablate heart tissue: ? Heat (radiofrequency energy). ? Laser energy. ? Extreme cold (cryoablation).  When the necessary tissue has been ablated, the catheter will be removed.  Pressure will be held on the catheter insertion area to prevent excessive bleeding.  A bandage (dressing) will be placed over the catheter insertion area. The procedure may vary among health care providers and hospitals. What happens after the procedure?  Your blood pressure, heart rate, breathing rate, and blood oxygen level will be monitored until the medicines you were given have worn off.  Your catheter insertion area will be monitored for bleeding. You will need to lie still for a few hours to ensure that you do not bleed from the catheter insertion area.  Do not drive for 24 hours or as long as directed by your health care provider. Summary  Cardiac ablation is a procedure to disable (ablate) a small amount of heart tissue in very specific places. Ablating some of the problem areas can improve the heart rhythm or return it to normal.  During the procedure, electrical currents will be sent from the catheter to ablate heart tissue in desired areas. This information is not intended to replace advice given to you by your health care provider. Make sure you discuss any questions you have with your health care provider. Document Revised: 01/17/2018 Document Reviewed: 06/15/2016 Elsevier Patient Education  2020 [employer]vier Inc[employer]   

## 2020-03-28 DIAGNOSIS — M47896 Other spondylosis, lumbar region: Secondary | ICD-10-CM | POA: Diagnosis not present

## 2020-03-28 LAB — CBC WITH DIFFERENTIAL/PLATELET
Basophils Absolute: 0 10*3/uL (ref 0.0–0.2)
Basos: 0 %
EOS (ABSOLUTE): 0.2 10*3/uL (ref 0.0–0.4)
Eos: 3 %
Hematocrit: 35.6 % — ABNORMAL LOW (ref 37.5–51.0)
Hemoglobin: 12.2 g/dL — ABNORMAL LOW (ref 13.0–17.7)
Immature Grans (Abs): 0 10*3/uL (ref 0.0–0.1)
Immature Granulocytes: 0 %
Lymphocytes Absolute: 1.7 10*3/uL (ref 0.7–3.1)
Lymphs: 24 %
MCH: 30.3 pg (ref 26.6–33.0)
MCHC: 34.3 g/dL (ref 31.5–35.7)
MCV: 89 fL (ref 79–97)
Monocytes Absolute: 0.6 10*3/uL (ref 0.1–0.9)
Monocytes: 8 %
Neutrophils Absolute: 4.4 10*3/uL (ref 1.4–7.0)
Neutrophils: 65 %
Platelets: 153 10*3/uL (ref 150–450)
RBC: 4.02 x10E6/uL — ABNORMAL LOW (ref 4.14–5.80)
RDW: 12.8 % (ref 11.6–15.4)
WBC: 7 10*3/uL (ref 3.4–10.8)

## 2020-03-28 LAB — BASIC METABOLIC PANEL
BUN/Creatinine Ratio: 13 (ref 10–24)
BUN: 14 mg/dL (ref 8–27)
CO2: 25 mmol/L (ref 20–29)
Calcium: 9.5 mg/dL (ref 8.6–10.2)
Chloride: 102 mmol/L (ref 96–106)
Creatinine, Ser: 1.05 mg/dL (ref 0.76–1.27)
GFR calc Af Amer: 76 mL/min/{1.73_m2} (ref >59)
GFR calc non Af Amer: 66 mL/min/{1.73_m2} (ref >59)
Glucose: 163 mg/dL — ABNORMAL HIGH (ref 65–99)
Potassium: 4.4 mmol/L (ref 3.5–5.2)
Sodium: 140 mmol/L (ref 134–144)

## 2020-03-29 ENCOUNTER — Telehealth: Payer: Self-pay

## 2020-03-29 DIAGNOSIS — I4819 Other persistent atrial fibrillation: Secondary | ICD-10-CM

## 2020-03-29 NOTE — Telephone Encounter (Signed)
-----   Message from Vickie Epley, MD sent at 03/27/2020  7:21 PM EDT ----- Marykay Lex, can we get Mr Choyce an echo before his ablation please? Thanks!Lysbeth Galas

## 2020-03-29 NOTE — Telephone Encounter (Signed)
Will schedule for echo prior to procedure

## 2020-03-29 NOTE — Progress Notes (Signed)
Stable labs

## 2020-04-01 ENCOUNTER — Telehealth: Payer: Self-pay

## 2020-04-01 NOTE — Telephone Encounter (Signed)
Spoke with Pt.  Pt aware of echo appt.

## 2020-04-01 NOTE — Telephone Encounter (Signed)
LMTCB  Need to discuss ECHO

## 2020-04-01 NOTE — Telephone Encounter (Signed)
2nd message left on cell phone.

## 2020-04-10 ENCOUNTER — Ambulatory Visit (HOSPITAL_COMMUNITY): Payer: Medicare Other | Attending: Cardiovascular Disease

## 2020-04-10 ENCOUNTER — Other Ambulatory Visit: Payer: Self-pay

## 2020-04-10 DIAGNOSIS — I4819 Other persistent atrial fibrillation: Secondary | ICD-10-CM | POA: Diagnosis not present

## 2020-04-10 LAB — ECHOCARDIOGRAM COMPLETE
Area-P 1/2: 2.51 cm2
P 1/2 time: 698 msec
S' Lateral: 3 cm

## 2020-04-12 DIAGNOSIS — M47816 Spondylosis without myelopathy or radiculopathy, lumbar region: Secondary | ICD-10-CM | POA: Diagnosis not present

## 2020-04-13 ENCOUNTER — Other Ambulatory Visit: Payer: Self-pay | Admitting: Nurse Practitioner

## 2020-04-15 ENCOUNTER — Encounter: Payer: Self-pay | Admitting: Dermatology

## 2020-04-15 NOTE — Progress Notes (Signed)
   Follow-Up Visit   Subjective  Antonio Clark is a 84 y.o. male who presents for the following: Follow-up (Dermatitis follow up - still has some scarring but it is much better. He treated with Betamethasone.).  Rash Location: Torso Duration:  Quality: Clear except for dark spots Associated Signs/Symptoms: Modifying Factors:  Severity:  Timing: Context: Also has some crust on sun exposed areas  Objective  Well appearing patient in no apparent distress; mood and affect are within normal limits.  All skin waist up examined.   Assessment & Plan    AK (actinic keratosis) (3) Right Hand - Posterior; Left Forearm - Posterior; Right Cheek  Destruction of lesion - Left Forearm - Posterior, Right Cheek, Right Hand - Posterior Complexity: simple   Destruction method: cryotherapy   Informed consent: discussed and consent obtained   Timeout:  patient name, date of birth, surgical site, and procedure verified Lesion destroyed using liquid nitrogen: Yes   Region frozen until ice ball extended beyond lesion: Yes   Cryotherapy cycles:  5 Outcome: patient tolerated procedure well with no complications   Post-procedure details: wound care instructions given    Dermatitis abdomen and back  Dermatitis - Resolved with PIH. Discontinue Betamethasone - may restart if rash flares.  Routine follow-up for an extensive lower torso rash on Antonio Clark date of birth 09/23/1935.  He diligently used a prescription cream with essentially complete clearing of the active rash.  The itching is gone.  There is some residual erythema on the lower stomach and noticeable postinflammatory hyperpigmentation on the lower back and the sides.  He can did not discontinue use of the cream but may restart it for a week if there is a mild flare.  For now no biopsy or patch testing is indicated.  The second issue we discussed today were some crusts on the right cheek right hand (2 and left forearm; these are  most likely precancerous keratoses and were treated with 5-second liquid nitrogen freeze.  These will swell and may peel in the next 2 weeks.  There is no restriction or special care.  If the freezing fails, Mr. Antonio Clark will return in the fall or winter for biopsies.  Finally we discussed the little warty bumps that he is prone to get on the lower legs and feet that represent acro keratoses.  They tend to peel off with some topical when he gets a pedicure but they always recur.  I discouraged him from having these surgically removed or frozen or treated with any wart medicine.  If any of them grow or bleed I will recheck.  Routine follow-up as needed     I, Lavonna Monarch, MD, have reviewed all documentation for this visit.  The documentation on 04/15/20 for the exam, diagnosis, procedures, and orders are all accurate and complete.

## 2020-04-16 ENCOUNTER — Telehealth: Payer: Self-pay | Admitting: *Deleted

## 2020-04-16 ENCOUNTER — Other Ambulatory Visit (HOSPITAL_COMMUNITY)
Admission: RE | Admit: 2020-04-16 | Discharge: 2020-04-16 | Disposition: A | Discharge status: Home / Self Care | Payer: Medicare Other | Source: Ambulatory Visit | Attending: Cardiology | Admitting: Cardiology

## 2020-04-16 DIAGNOSIS — Z01812 Encounter for preprocedural laboratory examination: Secondary | ICD-10-CM | POA: Diagnosis not present

## 2020-04-16 DIAGNOSIS — Z20822 Contact with and (suspected) exposure to covid-19: Secondary | ICD-10-CM | POA: Insufficient documentation

## 2020-04-16 LAB — SARS CORONAVIRUS 2 (TAT 6-24 HRS): SARS Coronavirus 2: NEGATIVE

## 2020-04-16 NOTE — Telephone Encounter (Signed)
S/w pt to clarify dosage of HCTZ.  Pt states taking one tablet, by mouth (25 mg) daily.  Sent into requested Pharmacy.

## 2020-04-16 NOTE — Progress Notes (Signed)
Instructed patient on the following items: Arrival time 0630 Nothing to eat or drink after midnight No meds AM of procedure Responsible person to drive you home and stay with you for 24 hrs  Have you missed any doses of anti-coagulant Pradaxa-no doses missed, will take both doses today.

## 2020-04-17 ENCOUNTER — Ambulatory Visit (HOSPITAL_COMMUNITY): Payer: Medicare Other | Admitting: Certified Registered"

## 2020-04-17 ENCOUNTER — Encounter (HOSPITAL_COMMUNITY): Payer: Self-pay | Admitting: Cardiology

## 2020-04-17 ENCOUNTER — Ambulatory Visit (HOSPITAL_COMMUNITY)
Admission: RE | Admit: 2020-04-17 | Discharge: 2020-04-17 | Disposition: A | Discharge status: Home / Self Care | Payer: Medicare Other | Attending: Cardiology | Admitting: Cardiology

## 2020-04-17 ENCOUNTER — Other Ambulatory Visit: Payer: Self-pay

## 2020-04-17 ENCOUNTER — Encounter (HOSPITAL_COMMUNITY)
Admission: RE | Disposition: A | Discharge status: Home / Self Care | Payer: Medicare Other | Source: Home / Self Care | Attending: Cardiology

## 2020-04-17 DIAGNOSIS — I4819 Other persistent atrial fibrillation: Secondary | ICD-10-CM | POA: Insufficient documentation

## 2020-04-17 DIAGNOSIS — I10 Essential (primary) hypertension: Secondary | ICD-10-CM | POA: Insufficient documentation

## 2020-04-17 DIAGNOSIS — Z85820 Personal history of malignant melanoma of skin: Secondary | ICD-10-CM | POA: Insufficient documentation

## 2020-04-17 DIAGNOSIS — Z8546 Personal history of malignant neoplasm of prostate: Secondary | ICD-10-CM | POA: Insufficient documentation

## 2020-04-17 DIAGNOSIS — G4733 Obstructive sleep apnea (adult) (pediatric): Secondary | ICD-10-CM | POA: Diagnosis not present

## 2020-04-17 DIAGNOSIS — Z79899 Other long term (current) drug therapy: Secondary | ICD-10-CM | POA: Insufficient documentation

## 2020-04-17 DIAGNOSIS — E785 Hyperlipidemia, unspecified: Secondary | ICD-10-CM | POA: Insufficient documentation

## 2020-04-17 DIAGNOSIS — I2510 Atherosclerotic heart disease of native coronary artery without angina pectoris: Secondary | ICD-10-CM | POA: Diagnosis not present

## 2020-04-17 DIAGNOSIS — Z7901 Long term (current) use of anticoagulants: Secondary | ICD-10-CM | POA: Diagnosis not present

## 2020-04-17 DIAGNOSIS — I483 Typical atrial flutter: Secondary | ICD-10-CM | POA: Diagnosis not present

## 2020-04-17 DIAGNOSIS — E119 Type 2 diabetes mellitus without complications: Secondary | ICD-10-CM | POA: Diagnosis not present

## 2020-04-17 DIAGNOSIS — I4891 Unspecified atrial fibrillation: Secondary | ICD-10-CM | POA: Diagnosis not present

## 2020-04-17 DIAGNOSIS — K219 Gastro-esophageal reflux disease without esophagitis: Secondary | ICD-10-CM | POA: Diagnosis not present

## 2020-04-17 HISTORY — PX: ATRIAL FIBRILLATION ABLATION: EP1191

## 2020-04-17 LAB — POCT ACTIVATED CLOTTING TIME
Activated Clotting Time: 268 seconds
Activated Clotting Time: 301 seconds
Activated Clotting Time: 307 seconds

## 2020-04-17 SURGERY — ATRIAL FIBRILLATION ABLATION
Anesthesia: General

## 2020-04-17 MED ORDER — SODIUM CHLORIDE 0.9 % IV SOLN: INTRAVENOUS | Status: DC

## 2020-04-17 MED ORDER — SODIUM CHLORIDE 0.9% FLUSH: 3.0000 mL | INTRAVENOUS | Status: DC | PRN

## 2020-04-17 MED ORDER — PROPOFOL 10 MG/ML IV BOLUS
INTRAVENOUS | Status: DC | PRN
  Administered 2020-04-17: 100 mg via INTRAVENOUS
  Administered 2020-04-17: 20 mg via INTRAVENOUS
  Administered 2020-04-17: 30 mg via INTRAVENOUS

## 2020-04-17 MED ORDER — ROCURONIUM BROMIDE 10 MG/ML (PF) SYRINGE
PREFILLED_SYRINGE | INTRAVENOUS | Status: DC | PRN
  Administered 2020-04-17: 10 mg via INTRAVENOUS
  Administered 2020-04-17: 50 mg via INTRAVENOUS
  Administered 2020-04-17: 40 mg via INTRAVENOUS
  Administered 2020-04-17: 10 mg via INTRAVENOUS

## 2020-04-17 MED ORDER — ISOPROTERENOL HCL 0.2 MG/ML IJ SOLN
INTRAMUSCULAR | Status: AC
  Filled 2020-04-17: qty 5

## 2020-04-17 MED ORDER — ISOPROTERENOL HCL 0.2 MG/ML IJ SOLN
INTRAVENOUS | Status: DC | PRN
  Administered 2020-04-17: 5 ug/min via INTRAVENOUS

## 2020-04-17 MED ORDER — FENTANYL CITRATE (PF) 100 MCG/2ML IJ SOLN
INTRAMUSCULAR | Status: DC | PRN
  Administered 2020-04-17: 50 ug via INTRAVENOUS
  Administered 2020-04-17 (×2): 25 ug via INTRAVENOUS

## 2020-04-17 MED ORDER — HEPARIN (PORCINE) IN NACL 1000-0.9 UT/500ML-% IV SOLN
INTRAVENOUS | Status: AC
  Filled 2020-04-17: qty 2000

## 2020-04-17 MED ORDER — SODIUM CHLORIDE 0.9% FLUSH: 3.0000 mL | Freq: Two times a day (BID) | INTRAVENOUS | Status: DC

## 2020-04-17 MED ORDER — LACTATED RINGERS IV SOLN: INTRAVENOUS | Status: DC | PRN

## 2020-04-17 MED ORDER — PROTAMINE SULFATE 10 MG/ML IV SOLN
INTRAVENOUS | Status: DC | PRN
  Administered 2020-04-17: 20 mg via INTRAVENOUS
  Administered 2020-04-17: 10 mg via INTRAVENOUS

## 2020-04-17 MED ORDER — SODIUM CHLORIDE 0.9 % IV SOLN: 250.0000 mL | INTRAVENOUS | Status: DC | PRN

## 2020-04-17 MED ORDER — PHENYLEPHRINE HCL (PRESSORS) 10 MG/ML IV SOLN
INTRAVENOUS | Status: DC | PRN
  Administered 2020-04-17 (×3): 80 ug via INTRAVENOUS

## 2020-04-17 MED ORDER — LIDOCAINE 2% (20 MG/ML) 5 ML SYRINGE
INTRAMUSCULAR | Status: DC | PRN
  Administered 2020-04-17: 20 mg via INTRAVENOUS

## 2020-04-17 MED ORDER — ACETAMINOPHEN 325 MG PO TABS: 650.0000 mg | ORAL_TABLET | ORAL | Status: DC | PRN

## 2020-04-17 MED ORDER — HEPARIN SODIUM (PORCINE) 1000 UNIT/ML IJ SOLN
INTRAMUSCULAR | Status: DC | PRN
  Administered 2020-04-17: 4000 [IU] via INTRAVENOUS
  Administered 2020-04-17: 2000 [IU] via INTRAVENOUS
  Administered 2020-04-17: 12000 [IU] via INTRAVENOUS

## 2020-04-17 MED ORDER — SUGAMMADEX SODIUM 200 MG/2ML IV SOLN
INTRAVENOUS | Status: DC | PRN
  Administered 2020-04-17: 200 mg via INTRAVENOUS

## 2020-04-17 MED ORDER — ONDANSETRON HCL 4 MG/2ML IJ SOLN: 4.0000 mg | Freq: Four times a day (QID) | INTRAMUSCULAR | Status: DC | PRN

## 2020-04-17 MED ORDER — HEPARIN SODIUM (PORCINE) 1000 UNIT/ML IJ SOLN
INTRAMUSCULAR | Status: DC | PRN
  Administered 2020-04-17: 1000 [IU] via INTRAVENOUS

## 2020-04-17 MED ORDER — HEPARIN (PORCINE) IN NACL 1000-0.9 UT/500ML-% IV SOLN
INTRAVENOUS | Status: DC | PRN
  Administered 2020-04-17 (×4): 500 mL

## 2020-04-17 MED ORDER — ONDANSETRON HCL 4 MG/2ML IJ SOLN
INTRAMUSCULAR | Status: DC | PRN
  Administered 2020-04-17: 4 mg via INTRAVENOUS

## 2020-04-17 MED ORDER — PHENYLEPHRINE HCL-NACL 10-0.9 MG/250ML-% IV SOLN
INTRAVENOUS | Status: DC | PRN
  Administered 2020-04-17: 35 ug/min via INTRAVENOUS
  Administered 2020-04-17: 25 ug/min via INTRAVENOUS

## 2020-04-17 SURGICAL SUPPLY — 20 items
BLANKET WARM UNDERBOD FULL ACC (MISCELLANEOUS) ×3 IMPLANT
CATH 8FR REPROCESSED SOUNDSTAR (CATHETERS) ×3 IMPLANT
CATH MAPPNG PENTARAY F 2-6-2MM (CATHETERS) ×1 IMPLANT
CATH S CIRCA THERM PROBE 10F (CATHETERS) ×3 IMPLANT
CATH SMTCH THERMOCOOL SF DF (CATHETERS) ×3 IMPLANT
CATH WEBSTER BI DIR CS D-F CRV (CATHETERS) ×3 IMPLANT
COVER SWIFTLINK CONNECTOR (BAG) ×3 IMPLANT
DEVICE CLOSURE PERCLS PRGLD 6F (VASCULAR PRODUCTS) ×4 IMPLANT
PACK EP LATEX FREE (CUSTOM PROCEDURE TRAY) ×3
PACK EP LF (CUSTOM PROCEDURE TRAY) ×1 IMPLANT
PAD PRO RADIOLUCENT 2001M-C (PAD) ×3 IMPLANT
PATCH CARTO3 (PAD) ×3 IMPLANT
PENTARAY F 2-6-2MM (CATHETERS) ×3
PERCLOSE PROGLIDE 6F (VASCULAR PRODUCTS) ×12
SHEATH BAYLIS TRANSSEPTAL 98CM (NEEDLE) ×3 IMPLANT
SHEATH CARTO VIZIGO SM CVD (SHEATH) ×3 IMPLANT
SHEATH PINNACLE 8F 10CM (SHEATH) ×6 IMPLANT
SHEATH PINNACLE 9F 10CM (SHEATH) ×3 IMPLANT
SHEATH PROBE COVER 6X72 (BAG) ×3 IMPLANT
TUBING SMART ABLATE COOLFLOW (TUBING) ×3 IMPLANT

## 2020-04-17 NOTE — Progress Notes (Signed)
Up and walked and tolerated well; bilat groins stable, no bleeding or hematoma 

## 2020-04-17 NOTE — Anesthesia Postprocedure Evaluation (Signed)
Anesthesia Post Note  Patient: Antonio Clark  Procedure(s) Performed: ATRIAL FIBRILLATION ABLATION (N/A )     Patient location during evaluation: PACU Anesthesia Type: General Level of consciousness: awake and alert, oriented and patient cooperative Pain management: pain level controlled Vital Signs Assessment: post-procedure vital signs reviewed and stable Respiratory status: spontaneous breathing, nonlabored ventilation and respiratory function stable Cardiovascular status: blood pressure returned to baseline and stable Postop Assessment: no apparent nausea or vomiting Anesthetic complications: no   No complications documented.  Last Vitals:  Vitals:   04/17/20 1205 04/17/20 1215  BP: 109/64 (!) 108/56  Pulse: 81 81  Resp: 11 20  Temp:  36.5 C  SpO2: 100% 99%    Last Pain:  Vitals:   04/17/20 1215  TempSrc: Temporal  PainSc: Box

## 2020-04-17 NOTE — Anesthesia Preprocedure Evaluation (Addendum)
Anesthesia Evaluation  Patient identified by MRN, date of birth, ID band Patient awake    Reviewed: Allergy & Precautions, NPO status , Patient's Chart, lab work & pertinent test results  Airway Mallampati: I  TM Distance: >3 FB Neck ROM: Full    Dental  (+) Partial Lower, Teeth Intact,  Multiple shallow blisters on tongue:   Pulmonary sleep apnea (does not use all the time) and Continuous Positive Airway Pressure Ventilation ,    Pulmonary exam normal breath sounds clear to auscultation       Cardiovascular hypertension (did not take HCTZ this morning), Pt. on medications Normal cardiovascular exam+ dysrhythmias (on pradaxa- took 5pm yesterday) Atrial Fibrillation  Rhythm:Regular Rate:Normal     Neuro/Psych negative neurological ROS  negative psych ROS   GI/Hepatic Neg liver ROS, GERD  Medicated and Controlled,  Endo/Other  diabetes (pre-diabetic), Well Controlled, Type 2  Renal/GU negative Renal ROS  negative genitourinary   Musculoskeletal  (+) Arthritis , Osteoarthritis,    Abdominal Normal abdominal exam  (+)   Peds negative pediatric ROS (+)  Hematology negative hematology ROS (+)   Anesthesia Other Findings   Reproductive/Obstetrics negative OB ROS                           Anesthesia Physical Anesthesia Plan  ASA: III  Anesthesia Plan: General   Post-op Pain Management:    Induction: Intravenous  PONV Risk Score and Plan: Ondansetron, Dexamethasone and Treatment may vary due to age or medical condition  Airway Management Planned: Oral ETT  Additional Equipment: None  Intra-op Plan:   Post-operative Plan: Extubation in OR  Informed Consent: I have reviewed the patients History and Physical, chart, labs and discussed the procedure including the risks, benefits and alternatives for the proposed anesthesia with the patient or authorized representative who has indicated  his/her understanding and acceptance.     Dental advisory given  Plan Discussed with: CRNA  Anesthesia Plan Comments:         Anesthesia Quick Evaluation

## 2020-04-17 NOTE — Progress Notes (Signed)
Client advised to take pradaxa when gets home by Dr Quentin Ore and client voiced understanding

## 2020-04-17 NOTE — Anesthesia Procedure Notes (Addendum)
Procedure Name: Intubation Date/Time: 04/17/2020 8:26 AM Performed by: Lavell Luster, CRNA Pre-anesthesia Checklist: Patient identified, Emergency Drugs available, Suction available, Patient being monitored and Timeout performed Patient Re-evaluated:Patient Re-evaluated prior to induction Oxygen Delivery Method: Circle system utilized Preoxygenation: Pre-oxygenation with 100% oxygen Induction Type: IV induction Ventilation: Mask ventilation without difficulty Laryngoscope Size: Mac and 4 Grade View: Grade I Tube type: Oral Tube size: 7.5 mm Number of attempts: 1 Airway Equipment and Method: Stylet Placement Confirmation: ETT inserted through vocal cords under direct vision,  positive ETCO2 and breath sounds checked- equal and bilateral Secured at: 22 cm Tube secured with: Tape Dental Injury: Teeth and Oropharynx as per pre-operative assessment  Comments: Blisters on tongue noted during intubation.  HQuinn, CRNA

## 2020-04-17 NOTE — Progress Notes (Signed)
Client up and walked at 1530 and tolerated well ; bilat groins stable, no bleeding or hematoma; client had been discharged from computer in error at 1500

## 2020-04-17 NOTE — Transfer of Care (Signed)
Immediate Anesthesia Transfer of Care Note  Patient: Antonio Clark  Procedure(s) Performed: ATRIAL FIBRILLATION ABLATION (N/A )  Patient Location: Cath Lab  Anesthesia Type:General  Level of Consciousness: awake, alert  and oriented  Airway & Oxygen Therapy: Patient connected to nasal cannula oxygen  Post-op Assessment: Post -op Vital signs reviewed and stable  Post vital signs: stable  Last Vitals:  Vitals Value Taken Time  BP    Temp    Pulse    Resp    SpO2      Last Pain:  Vitals:   04/17/20 0631  TempSrc: Oral         Complications: No complications documented.

## 2020-04-17 NOTE — Discharge Instructions (Signed)
Cardiac Ablation, Care After This sheet gives you information about how to care for yourself after your procedure. Your health care provider may also give you more specific instructions. If you have problems or questions, contact your health care provider. What can I expect after the procedure? After the procedure, it is common to have:  Bruising around your puncture site.  Tenderness around your puncture site.  Skipped heartbeats.  Tiredness (fatigue). Follow these instructions at home: Puncture site care   Follow instructions from your health care provider about how to take care of your puncture site. Make sure you: ? Wash your hands with soap and water before you change your bandage (dressing). If soap and water are not available, use hand sanitizer. ? Change your dressing as told by your health care provider. ? Leave stitches (sutures), skin glue, or adhesive strips in place. These skin closures may need to stay in place for up to 2 weeks. If adhesive strip edges start to loosen and curl up, you may trim the loose edges. Do not remove adhesive strips completely unless your health care provider tells you to do that.  Check your puncture site every day for signs of infection. Check for: ? Redness, swelling, or pain. ? Fluid or blood. If your puncture site starts to bleed, lie down on your back, apply firm pressure to the area, and contact your health care provider. ? Warmth. ? Pus or a bad smell. Driving  Ask your health care provider when it is safe for you to drive again after the procedure.  Do not drive or use heavy machinery while taking prescription pain medicine.  Do not drive for 24 hours if you were given a medicine to help you relax (sedative) during your procedure. Activity  Avoid activities that take a lot of effort for at least 3 days after your procedure.  Do not lift anything that is heavier than 10 lb (4.5 kg), or the limit that you are told, until your health  care provider says that it is safe.  Return to your normal activities as told by your health care provider. Ask your health care provider what activities are safe for you. General instructions  Take over-the-counter and prescription medicines only as told by your health care provider.  Do not use any products that contain nicotine or tobacco, such as cigarettes and e-cigarettes. If you need help quitting, ask your health care provider.  Do not take baths, swim, or use a hot tub until your health care provider approves.  Do not drink alcohol for 24 hours after your procedure.  Keep all follow-up visits as told by your health care provider. This is important. Contact a health care provider if:  You have redness, mild swelling, or pain around your puncture site.  You have fluid or blood coming from your puncture site that stops after applying firm pressure to the area.  Your puncture site feels warm to the touch.  You have pus or a bad smell coming from your puncture site.  You have a fever.  You have chest pain or discomfort that spreads to your neck, jaw, or arm.  You are sweating a lot.  You feel nauseous.  You have a fast or irregular heartbeat.  You have shortness of breath.  You are dizzy or light-headed and feel the need to lie down.  You have pain or numbness in the arm or leg closest to your puncture site. Get help right away if:  Your puncture   site suddenly swells.  Your puncture site is bleeding and the bleeding does not stop after applying firm pressure to the area. These symptoms may represent a serious problem that is an emergency. Do not wait to see if the symptoms will go away. Get medical help right away. Call your local emergency services (911 in the [place] Do not drive yourself to the hospital. Summary  After the procedure, it is normal to have bruising and tenderness at the puncture site in your groin, neck, or forearm.  Check your puncture site every  day for signs of infection.  Get help right away if your puncture site is bleeding and the bleeding does not stop after applying firm pressure to the area. This is a medical emergency. This information is not intended to replace advice given to you by your health care provider. Make sure you discuss any questions you have with your health care provider. Document Revised: 07/09/2017 Document Reviewed: 11/05/2016 Elsevier Patient Education  2020 Elsevier Inc.  

## 2020-04-17 NOTE — Progress Notes (Signed)
Dr Lambert in and ok to dc home 

## 2020-04-17 NOTE — Interval H&P Note (Signed)
History and Physical Interval Note:  04/17/2020 7:30 AM  Antonio Clark  has presented today for surgery, with the diagnosis of afib.  The various methods of treatment have been discussed with the patient and family. After consideration of risks, benefits and other options for treatment, the patient has consented to  Procedure(s): ATRIAL FIBRILLATION ABLATION (N/A) as a surgical intervention.  The patient's history has been reviewed, patient examined, no change in status, stable for surgery.  I have reviewed the patient's chart and labs.  Questions were answered to the patient's satisfaction.     Dorothymae Maciver T Roselinda Bahena

## 2020-04-24 DIAGNOSIS — M9905 Segmental and somatic dysfunction of pelvic region: Secondary | ICD-10-CM | POA: Diagnosis not present

## 2020-04-24 DIAGNOSIS — M9903 Segmental and somatic dysfunction of lumbar region: Secondary | ICD-10-CM | POA: Diagnosis not present

## 2020-04-24 DIAGNOSIS — M5136 Other intervertebral disc degeneration, lumbar region: Secondary | ICD-10-CM | POA: Diagnosis not present

## 2020-04-24 DIAGNOSIS — M5416 Radiculopathy, lumbar region: Secondary | ICD-10-CM | POA: Diagnosis not present

## 2020-04-29 DIAGNOSIS — M5416 Radiculopathy, lumbar region: Secondary | ICD-10-CM | POA: Diagnosis not present

## 2020-04-29 DIAGNOSIS — M5136 Other intervertebral disc degeneration, lumbar region: Secondary | ICD-10-CM | POA: Diagnosis not present

## 2020-04-29 DIAGNOSIS — M9903 Segmental and somatic dysfunction of lumbar region: Secondary | ICD-10-CM | POA: Diagnosis not present

## 2020-04-29 DIAGNOSIS — M9905 Segmental and somatic dysfunction of pelvic region: Secondary | ICD-10-CM | POA: Diagnosis not present

## 2020-04-30 DIAGNOSIS — M9905 Segmental and somatic dysfunction of pelvic region: Secondary | ICD-10-CM | POA: Diagnosis not present

## 2020-04-30 DIAGNOSIS — M5416 Radiculopathy, lumbar region: Secondary | ICD-10-CM | POA: Diagnosis not present

## 2020-04-30 DIAGNOSIS — M5136 Other intervertebral disc degeneration, lumbar region: Secondary | ICD-10-CM | POA: Diagnosis not present

## 2020-04-30 DIAGNOSIS — M9903 Segmental and somatic dysfunction of lumbar region: Secondary | ICD-10-CM | POA: Diagnosis not present

## 2020-05-01 DIAGNOSIS — M5416 Radiculopathy, lumbar region: Secondary | ICD-10-CM | POA: Diagnosis not present

## 2020-05-01 DIAGNOSIS — M9903 Segmental and somatic dysfunction of lumbar region: Secondary | ICD-10-CM | POA: Diagnosis not present

## 2020-05-01 DIAGNOSIS — M5136 Other intervertebral disc degeneration, lumbar region: Secondary | ICD-10-CM | POA: Diagnosis not present

## 2020-05-01 DIAGNOSIS — M9905 Segmental and somatic dysfunction of pelvic region: Secondary | ICD-10-CM | POA: Diagnosis not present

## 2020-05-06 ENCOUNTER — Other Ambulatory Visit: Payer: Self-pay

## 2020-05-06 ENCOUNTER — Encounter (HOSPITAL_COMMUNITY): Payer: Self-pay | Admitting: Nurse Practitioner

## 2020-05-06 ENCOUNTER — Ambulatory Visit (HOSPITAL_COMMUNITY)
Admission: RE | Admit: 2020-05-06 | Discharge: 2020-05-06 | Disposition: A | Discharge status: Home / Self Care | Payer: Medicare Other | Source: Ambulatory Visit | Attending: Nurse Practitioner | Admitting: Nurse Practitioner

## 2020-05-06 VITALS — BP 128/58 | HR 108 | Ht 69.0 in | Wt 194.0 lb

## 2020-05-06 DIAGNOSIS — I10 Essential (primary) hypertension: Secondary | ICD-10-CM | POA: Insufficient documentation

## 2020-05-06 DIAGNOSIS — D6869 Other thrombophilia: Secondary | ICD-10-CM | POA: Diagnosis not present

## 2020-05-06 DIAGNOSIS — R7303 Prediabetes: Secondary | ICD-10-CM | POA: Diagnosis not present

## 2020-05-06 DIAGNOSIS — I4891 Unspecified atrial fibrillation: Secondary | ICD-10-CM | POA: Insufficient documentation

## 2020-05-06 DIAGNOSIS — Z8249 Family history of ischemic heart disease and other diseases of the circulatory system: Secondary | ICD-10-CM | POA: Diagnosis not present

## 2020-05-06 DIAGNOSIS — E785 Hyperlipidemia, unspecified: Secondary | ICD-10-CM | POA: Diagnosis not present

## 2020-05-06 DIAGNOSIS — G4730 Sleep apnea, unspecified: Secondary | ICD-10-CM | POA: Diagnosis not present

## 2020-05-06 DIAGNOSIS — Z8546 Personal history of malignant neoplasm of prostate: Secondary | ICD-10-CM | POA: Insufficient documentation

## 2020-05-06 DIAGNOSIS — M9905 Segmental and somatic dysfunction of pelvic region: Secondary | ICD-10-CM | POA: Diagnosis not present

## 2020-05-06 DIAGNOSIS — Z85820 Personal history of malignant melanoma of skin: Secondary | ICD-10-CM | POA: Insufficient documentation

## 2020-05-06 DIAGNOSIS — Z833 Family history of diabetes mellitus: Secondary | ICD-10-CM | POA: Diagnosis not present

## 2020-05-06 DIAGNOSIS — M5136 Other intervertebral disc degeneration, lumbar region: Secondary | ICD-10-CM | POA: Diagnosis not present

## 2020-05-06 DIAGNOSIS — I4819 Other persistent atrial fibrillation: Secondary | ICD-10-CM | POA: Diagnosis not present

## 2020-05-06 DIAGNOSIS — M5416 Radiculopathy, lumbar region: Secondary | ICD-10-CM | POA: Diagnosis not present

## 2020-05-06 DIAGNOSIS — Z79899 Other long term (current) drug therapy: Secondary | ICD-10-CM | POA: Diagnosis not present

## 2020-05-06 DIAGNOSIS — M9903 Segmental and somatic dysfunction of lumbar region: Secondary | ICD-10-CM | POA: Diagnosis not present

## 2020-05-06 LAB — BASIC METABOLIC PANEL WITH GFR
Anion gap: 11 (ref 5–15)
BUN: 19 mg/dL (ref 8–23)
CO2: 22 mmol/L (ref 22–32)
Calcium: 9.1 mg/dL (ref 8.9–10.3)
Chloride: 106 mmol/L (ref 98–111)
Creatinine, Ser: 1.4 mg/dL — ABNORMAL HIGH (ref 0.61–1.24)
GFR calc Af Amer: 54 mL/min — ABNORMAL LOW
GFR calc non Af Amer: 46 mL/min — ABNORMAL LOW
Glucose, Bld: 114 mg/dL — ABNORMAL HIGH (ref 70–99)
Potassium: 4.1 mmol/L (ref 3.5–5.1)
Sodium: 139 mmol/L (ref 135–145)

## 2020-05-06 LAB — CBC
HCT: 31.8 % — ABNORMAL LOW (ref 39.0–52.0)
Hemoglobin: 10.2 g/dL — ABNORMAL LOW (ref 13.0–17.0)
MCH: 29.1 pg (ref 26.0–34.0)
MCHC: 32.1 g/dL (ref 30.0–36.0)
MCV: 90.9 fL (ref 80.0–100.0)
Platelets: 168 10*3/uL (ref 150–400)
RBC: 3.5 MIL/uL — ABNORMAL LOW (ref 4.22–5.81)
RDW: 13.9 % (ref 11.5–15.5)
WBC: 6.9 10*3/uL (ref 4.0–10.5)
nRBC: 0 % (ref 0.0–0.2)

## 2020-05-06 NOTE — H&P (View-Only) (Signed)
Primary Care Physician: Susy Frizzle, MD Referring Physician: Dr. Clydene Fake Antonio Clark is a 84 y.o. male with a h/o afib, s/p afib ablation 04/17/20. The first week, he noted that his HR's were stable in the 60's and 70's. For the last 2 weeks he has noted persistently  elevated HR's around 100 bpm. He is not on rate control as he  issues   in the past having soft BP's. He had a systolic BP of 00/93 earlier today. He does feel fatigue with the elevated HR"s. Ekg  shows afib at 108 bpm. He continues on Pradaza with a CHA2DS2VASc score of 4. States  no missed anticoagulation.   Today, he denies symptoms of palpitations, chest pain, shortness of breath, orthopnea, PND, lower extremity edema, dizziness, presyncope, syncope, or neurologic sequela. The patient is tolerating medications without difficulties and is otherwise without complaint today.   Past Medical History:  Diagnosis Date  . Arthritis   . Atrial fibrillation ([place])   . Back pain   . Borderline diabetic   . Dysrhythmia    a-fib  . GERD (gastroesophageal reflux disease)   . History of echocardiogram 07/ 07/ 2011  . History of lithotripsy 1989  . Hyperlipidemia   . Hypertension   . Melanoma ([place]) 05/01/2019   right chest wall (12/20)  . Pre-diabetes   . Prostate CA ([place]) 2010  . Sleep apnea    uses CPAP nightly  . Squamous cell carcinoma of skin 05/23/1992   bowens-left parietal scalp (CX35FU)  . Squamous cell carcinoma of skin 03/14/2008   in situ-left upper outer forehead-medial (CX35FU)  . Squamous cell carcinoma of skin 03/14/2008   in situ-crown of scalp (Cx35FU)  . Squamous cell carcinoma of skin 04/15/2011   in situ-right dorsal forearm (txpbx)  . Squamous cell carcinoma of skin 10/16/2011   in situ-left sideburn  . Squamous cell carcinoma of skin 03/11/2015   ka-left sideburn (CX35FU)  . Squamous cell carcinoma of skin 03/11/2015   ka-left forearm (CX35FU)  . Squamous cell carcinoma of skin 06/22/2018     in situ-left forearm, sup (txpbx)  . Squamous cell carcinoma of skin 05/01/2019   in situ-mid anterior scalp   . Squamous cell carcinoma of skin 05/01/2019   in situ-right upper arm   Past Surgical History:  Procedure Laterality Date  . ATRIAL FIBRILLATION ABLATION N/A 04/17/2020   Procedure: ATRIAL FIBRILLATION ABLATION;  Surgeon: Vickie Epley, MD;  Location: [facility];  Service: Cardiovascular;  Laterality: N/A;  . AXILLARY SENTINEL NODE BIOPSY Right 08/23/2019   Procedure: SENTINEL LYMPH NODE BIOSPY RIGHT AXILLA;  Surgeon: Stark Klein, MD;  Location: [facility];  Service: General;  Laterality: Right;  . COLONOSCOPY  2002  . FASCIECTOMY Left 03/28/2019   Procedure: SEGMENTAL FASCIECTOMY LEFT RING FINGER;  Surgeon: Daryll Brod, MD;  Location: [facility];  Service: Orthopedics;  Laterality: Left;  ANESTHESIA  AXILLARY BLOCK  . INSERTION PROSTATE RADIATION SEED  8 4 2010   per Dr Rosana Hoes  . MELANOMA EXCISION Right 06/22/2019   Procedure: WIDE LOCAL EXCISION RIGHT CHEST WALL MELANOMA, ADVANCEMENT FLAP CLOSURE FOR DEFECT 3X6 CM;  Surgeon: Stark Klein, MD;  Location: [facility];  Service: General;  Laterality: Right;  . POLYPECTOMY  2002  . THROAT SURGERY  1980   benign cyst  . URETHRAL STRICTURE DILATATION     also penile implant    Current Outpatient Medications  Medication Sig  Dispense Refill  . atorvastatin (LIPITOR) 40 MG tablet Take 1 tablet (40 mg total) by mouth daily. Please stop pravastatin 90 tablet 3  . clobetasol ointment (TEMOVATE) 7.10 % Apply 1 application topically as needed (skin irritation).     . diphenhydramine-acetaminophen (TYLENOL PM) 25-500 MG TABS tablet Take 1 tablet by mouth at bedtime as needed (sleep).    . hydrochlorothiazide (HYDRODIURIL) 25 MG tablet TAKE 1 TABLET BY MOUTH EVERY DAY 30 tablet 9  . ibuprofen (ADVIL) 200 MG tablet Take 200 mg by mouth as needed for headache or moderate pain.     .  multivitamin (THERAGRAN) per tablet Take 1 tablet by mouth daily.      . Omega-3 Fatty Acids (FISH OIL) 1200 MG CAPS Take 1,200 mg by mouth daily.      Marland Kitchen omeprazole (PRILOSEC) 20 MG capsule Take 20 mg by mouth as needed (heartburn).    Marland Kitchen PRADAXA 150 MG CAPS capsule TAKE 1 CAPSULE BY MOUTH TWICE A DAY (Patient taking differently: Take 150 mg by mouth 2 (two) times daily. ) 60 capsule 5  . Tamsulosin HCl (FLOMAX) 0.4 MG CAPS Take 0.4 mg by mouth daily.      . valsartan (DIOVAN) 320 MG tablet TAKE 1 TABLET BY MOUTH EVERY DAY (Patient taking differently: Take 320 mg by mouth daily. ) 90 tablet 2   No current facility-administered medications for this encounter.    No Known Allergies  Social History   Socioeconomic History  . Marital status: Single    Spouse name: Not on file  . Number of children: Not on file  . Years of education: Not on file  . Highest education level: Not on file  Occupational History  . Occupation: retired  Tobacco Use  . Smoking status: Never Smoker  . Smokeless tobacco: Never Used  Vaping Use  . Vaping Use: Never used  Substance and Sexual Activity  . Alcohol use: Yes    Comment: social  . Drug use: No  . Sexual activity: Yes  Other Topics Concern  . Not on file  Social History Narrative  . Not on file   Social Determinants of Health   Financial Resource Strain:   . Difficulty of Paying Living Expenses: Not on file  Food Insecurity:   . Worried About Charity fundraiser in the Last Year: Not on file  . Ran Out of Food in the Last Year: Not on file  Transportation Needs:   . Lack of Transportation (Medical): Not on file  . Lack of Transportation (Non-Medical): Not on file  Physical Activity:   . Days of Exercise per Week: Not on file  . Minutes of Exercise per Session: Not on file  Stress:   . Feeling of Stress : Not on file  Social Connections:   . Frequency of Communication with Friends and Family: Not on file  . Frequency of Social Gatherings  with Friends and Family: Not on file  . Attends Religious Services: Not on file  . Active Member of Clubs or Organizations: Not on file  . Attends Archivist Meetings: Not on file  . Marital Status: Not on file  Intimate Partner Violence:   . Fear of Current or Ex-Partner: Not on file  . Emotionally Abused: Not on file  . Physically Abused: Not on file  . Sexually Abused: Not on file    Family History  Problem Relation Age of Onset  . Coronary artery disease Brother  3 brothers had CABG  . Arrhythmia Brother   . Diabetes Brother   . Hearing loss Brother   . Hypertension Brother   . Heart disease Brother   . Heart failure Brother   . Hypertension Brother   . Heart disease Father   . Hypertension Father   . Sudden death Father   . Stroke Mother        cerebral hemorrage  . Colon cancer Mother 35       small intestine  . Cancer Mother   . Suicidality Sister   . Stroke Sister   . Diabetes Brother   . Hypertension Brother   . Hyperlipidemia Sister   . Esophageal cancer Neg Hx   . Pancreatic cancer Neg Hx   . Prostate cancer Neg Hx   . Rectal cancer Neg Hx   . Stomach cancer Neg Hx     ROS- All systems are reviewed and negative except as per the HPI above  Physical Exam: Vitals:   05/06/20 1536  BP: (!) 128/58  Pulse: (!) 108  Weight: 88 kg  Height: 5\' 9"  (1.753 m)   Wt Readings from Last 3 Encounters:  05/06/20 88 kg  04/17/20 83.9 kg  03/27/20 90 kg    Labs: Lab Results  Component Value Date   NA 140 03/27/2020   K 4.4 03/27/2020   CL 102 03/27/2020   CO2 25 03/27/2020   GLUCOSE 163 (H) 03/27/2020   BUN 14 03/27/2020   CREATININE 1.05 03/27/2020   CALCIUM 9.5 03/27/2020   Lab Results  Component Value Date   INR 1.0 03/06/2009   Lab Results  Component Value Date   CHOL 167 08/14/2019   HDL 45 08/14/2019   LDLCALC 94 08/14/2019   TRIG 184 (H) 08/14/2019     GEN- The patient is well appearing, alert and oriented x 3 today.     Head- normocephalic, atraumatic Eyes-  Sclera clear, conjunctiva pink Ears- hearing intact Oropharynx- clear Neck- supple, no JVP Lymph- no cervical lymphadenopathy Lungs- Clear to ausculation bilaterally, normal work of breathing Heart- Rapid irregular rate and rhythm, no murmurs, rubs or gallops, PMI not laterally displaced GI- soft, NT, ND, + BS Extremities- no clubbing, cyanosis, or edema MS- no significant deformity or atrophy Skin- no rash or lesion Psych- euthymic mood, full affect Neuro- strength and sensation are intact  EKG-afib at 108 bpm, qrs int 80 ms, qt int 439 ms    Assessment and Plan: 1. afib S/p ablation 04/17/20 Initially in SR x one week, it sounds as he has been persistently in afib x 2 weeks Rate control drugs have not been used in the past because of intermittent hypotension  I will set up for cardioversion  Risk vrs benefit explained to pt and he wishes to proceed Cbc/bmet today/covid test  He has had both vaccines   2. CHA2DS2VASc score of 4 States no missed doses of Pradzxa 150 mg bid for at least 3 weeks   F/u here as scheduled 05/15/20  Geroge Baseman. Lesley Atkin, [facility] [address] [place], [place] [address] [phone]

## 2020-05-06 NOTE — Progress Notes (Signed)
Primary Care Physician: Susy Frizzle, MD Referring Physician: Dr. Clydene Fake Antonio Clark is a 84 y.o. male with a h/o afib, s/p afib ablation 04/17/20. The first week, he noted that his HR's were stable in the 60's and 70's. For the last 2 weeks he has noted persistently  elevated HR's around 100 bpm. He is not on rate control as he  issues   in the past having soft BP's. He had a systolic BP of 40/34 earlier today. He does feel fatigue with the elevated HR"s. Ekg  shows afib at 108 bpm. He continues on Pradaza with a CHA2DS2VASc score of 4. States  no missed anticoagulation.   Today, he denies symptoms of palpitations, chest pain, shortness of breath, orthopnea, PND, lower extremity edema, dizziness, presyncope, syncope, or neurologic sequela. The patient is tolerating medications without difficulties and is otherwise without complaint today.   Past Medical History:  Diagnosis Date  . Arthritis   . Atrial fibrillation ([place])   . Back pain   . Borderline diabetic   . Dysrhythmia    a-fib  . GERD (gastroesophageal reflux disease)   . History of echocardiogram 07/ 07/ 2011  . History of lithotripsy 1989  . Hyperlipidemia   . Hypertension   . Melanoma ([place]) 05/01/2019   right chest wall (12/20)  . Pre-diabetes   . Prostate CA ([place]) 2010  . Sleep apnea    uses CPAP nightly  . Squamous cell carcinoma of skin 05/23/1992   bowens-left parietal scalp (CX35FU)  . Squamous cell carcinoma of skin 03/14/2008   in situ-left upper outer forehead-medial (CX35FU)  . Squamous cell carcinoma of skin 03/14/2008   in situ-crown of scalp (Cx35FU)  . Squamous cell carcinoma of skin 04/15/2011   in situ-right dorsal forearm (txpbx)  . Squamous cell carcinoma of skin 10/16/2011   in situ-left sideburn  . Squamous cell carcinoma of skin 03/11/2015   ka-left sideburn (CX35FU)  . Squamous cell carcinoma of skin 03/11/2015   ka-left forearm (CX35FU)  . Squamous cell carcinoma of skin 06/22/2018     in situ-left forearm, sup (txpbx)  . Squamous cell carcinoma of skin 05/01/2019   in situ-mid anterior scalp   . Squamous cell carcinoma of skin 05/01/2019   in situ-right upper arm   Past Surgical History:  Procedure Laterality Date  . ATRIAL FIBRILLATION ABLATION N/A 04/17/2020   Procedure: ATRIAL FIBRILLATION ABLATION;  Surgeon: Vickie Epley, MD;  Location: [facility];  Service: Cardiovascular;  Laterality: N/A;  . AXILLARY SENTINEL NODE BIOPSY Right 08/23/2019   Procedure: SENTINEL LYMPH NODE BIOSPY RIGHT AXILLA;  Surgeon: Stark Klein, MD;  Location: [facility];  Service: General;  Laterality: Right;  . COLONOSCOPY  2002  . FASCIECTOMY Left 03/28/2019   Procedure: SEGMENTAL FASCIECTOMY LEFT RING FINGER;  Surgeon: Daryll Brod, MD;  Location: [facility];  Service: Orthopedics;  Laterality: Left;  ANESTHESIA  AXILLARY BLOCK  . INSERTION PROSTATE RADIATION SEED  8 4 2010   per Dr Rosana Hoes  . MELANOMA EXCISION Right 06/22/2019   Procedure: WIDE LOCAL EXCISION RIGHT CHEST WALL MELANOMA, ADVANCEMENT FLAP CLOSURE FOR DEFECT 3X6 CM;  Surgeon: Stark Klein, MD;  Location: [facility];  Service: General;  Laterality: Right;  . POLYPECTOMY  2002  . THROAT SURGERY  1980   benign cyst  . URETHRAL STRICTURE DILATATION     also penile implant    Current Outpatient Medications  Medication Sig  Dispense Refill  . atorvastatin (LIPITOR) 40 MG tablet Take 1 tablet (40 mg total) by mouth daily. Please stop pravastatin 90 tablet 3  . clobetasol ointment (TEMOVATE) 2.29 % Apply 1 application topically as needed (skin irritation).     . diphenhydramine-acetaminophen (TYLENOL PM) 25-500 MG TABS tablet Take 1 tablet by mouth at bedtime as needed (sleep).    . hydrochlorothiazide (HYDRODIURIL) 25 MG tablet TAKE 1 TABLET BY MOUTH EVERY DAY 30 tablet 9  . ibuprofen (ADVIL) 200 MG tablet Take 200 mg by mouth as needed for headache or moderate pain.     .  multivitamin (THERAGRAN) per tablet Take 1 tablet by mouth daily.      . Omega-3 Fatty Acids (FISH OIL) 1200 MG CAPS Take 1,200 mg by mouth daily.      Marland Kitchen omeprazole (PRILOSEC) 20 MG capsule Take 20 mg by mouth as needed (heartburn).    Marland Kitchen PRADAXA 150 MG CAPS capsule TAKE 1 CAPSULE BY MOUTH TWICE A DAY (Patient taking differently: Take 150 mg by mouth 2 (two) times daily. ) 60 capsule 5  . Tamsulosin HCl (FLOMAX) 0.4 MG CAPS Take 0.4 mg by mouth daily.      . valsartan (DIOVAN) 320 MG tablet TAKE 1 TABLET BY MOUTH EVERY DAY (Patient taking differently: Take 320 mg by mouth daily. ) 90 tablet 2   No current facility-administered medications for this encounter.    No Known Allergies  Social History   Socioeconomic History  . Marital status: Single    Spouse name: Not on file  . Number of children: Not on file  . Years of education: Not on file  . Highest education level: Not on file  Occupational History  . Occupation: retired  Tobacco Use  . Smoking status: Never Smoker  . Smokeless tobacco: Never Used  Vaping Use  . Vaping Use: Never used  Substance and Sexual Activity  . Alcohol use: Yes    Comment: social  . Drug use: No  . Sexual activity: Yes  Other Topics Concern  . Not on file  Social History Narrative  . Not on file   Social Determinants of Health   Financial Resource Strain:   . Difficulty of Paying Living Expenses: Not on file  Food Insecurity:   . Worried About Charity fundraiser in the Last Year: Not on file  . Ran Out of Food in the Last Year: Not on file  Transportation Needs:   . Lack of Transportation (Medical): Not on file  . Lack of Transportation (Non-Medical): Not on file  Physical Activity:   . Days of Exercise per Week: Not on file  . Minutes of Exercise per Session: Not on file  Stress:   . Feeling of Stress : Not on file  Social Connections:   . Frequency of Communication with Friends and Family: Not on file  . Frequency of Social Gatherings  with Friends and Family: Not on file  . Attends Religious Services: Not on file  . Active Member of Clubs or Organizations: Not on file  . Attends Archivist Meetings: Not on file  . Marital Status: Not on file  Intimate Partner Violence:   . Fear of Current or Ex-Partner: Not on file  . Emotionally Abused: Not on file  . Physically Abused: Not on file  . Sexually Abused: Not on file    Family History  Problem Relation Age of Onset  . Coronary artery disease Brother  3 brothers had CABG  . Arrhythmia Brother   . Diabetes Brother   . Hearing loss Brother   . Hypertension Brother   . Heart disease Brother   . Heart failure Brother   . Hypertension Brother   . Heart disease Father   . Hypertension Father   . Sudden death Father   . Stroke Mother        cerebral hemorrage  . Colon cancer Mother 86       small intestine  . Cancer Mother   . Suicidality Sister   . Stroke Sister   . Diabetes Brother   . Hypertension Brother   . Hyperlipidemia Sister   . Esophageal cancer Neg Hx   . Pancreatic cancer Neg Hx   . Prostate cancer Neg Hx   . Rectal cancer Neg Hx   . Stomach cancer Neg Hx     ROS- All systems are reviewed and negative except as per the HPI above  Physical Exam: Vitals:   05/06/20 1536  BP: (!) 128/58  Pulse: (!) 108  Weight: 88 kg  Height: 5\' 9"  (1.753 m)   Wt Readings from Last 3 Encounters:  05/06/20 88 kg  04/17/20 83.9 kg  03/27/20 90 kg    Labs: Lab Results  Component Value Date   NA 140 03/27/2020   K 4.4 03/27/2020   CL 102 03/27/2020   CO2 25 03/27/2020   GLUCOSE 163 (H) 03/27/2020   BUN 14 03/27/2020   CREATININE 1.05 03/27/2020   CALCIUM 9.5 03/27/2020   Lab Results  Component Value Date   INR 1.0 03/06/2009   Lab Results  Component Value Date   CHOL 167 08/14/2019   HDL 45 08/14/2019   LDLCALC 94 08/14/2019   TRIG 184 (H) 08/14/2019     GEN- The patient is well appearing, alert and oriented x 3 today.     Head- normocephalic, atraumatic Eyes-  Sclera clear, conjunctiva pink Ears- hearing intact Oropharynx- clear Neck- supple, no JVP Lymph- no cervical lymphadenopathy Lungs- Clear to ausculation bilaterally, normal work of breathing Heart- Rapid irregular rate and rhythm, no murmurs, rubs or gallops, PMI not laterally displaced GI- soft, NT, ND, + BS Extremities- no clubbing, cyanosis, or edema MS- no significant deformity or atrophy Skin- no rash or lesion Psych- euthymic mood, full affect Neuro- strength and sensation are intact  EKG-afib at 108 bpm, qrs int 80 ms, qt int 439 ms    Assessment and Plan: 1. afib S/p ablation 04/17/20 Initially in SR x one week, it sounds as he has been persistently in afib x 2 weeks Rate control drugs have not been used in the past because of intermittent hypotension  I will set up for cardioversion  Risk vrs benefit explained to pt and he wishes to proceed Cbc/bmet today/covid test  He has had both vaccines   2. CHA2DS2VASc score of 4 States no missed doses of Pradzxa 150 mg bid for at least 3 weeks   F/u here as scheduled 05/15/20  Antonio Clark, [facility] [address] [place], [place] [address] [phone]

## 2020-05-06 NOTE — Patient Instructions (Signed)
Cardioversion scheduled for Friday, October 1st  - Arrive at the [facility] and go to admitting at [facility] not eat or drink anything after midnight the night prior to your procedure.  - Take all your morning medication (except diabetic medications) with a sip of water prior to arrival.  - You will not be able to drive home after your procedure.  - Do NOT miss any doses of your blood thinner - if you should miss a dose please notify our office immediately.  - If you feel as if you go back into normal rhythm prior to scheduled cardioversion, please notify our office immediately. If your procedure is canceled in the cardioversion suite you will be charged a cancellation fee.

## 2020-05-07 DIAGNOSIS — M5136 Other intervertebral disc degeneration, lumbar region: Secondary | ICD-10-CM | POA: Diagnosis not present

## 2020-05-07 DIAGNOSIS — M9903 Segmental and somatic dysfunction of lumbar region: Secondary | ICD-10-CM | POA: Diagnosis not present

## 2020-05-07 DIAGNOSIS — M9905 Segmental and somatic dysfunction of pelvic region: Secondary | ICD-10-CM | POA: Diagnosis not present

## 2020-05-07 DIAGNOSIS — M5416 Radiculopathy, lumbar region: Secondary | ICD-10-CM | POA: Diagnosis not present

## 2020-05-08 ENCOUNTER — Other Ambulatory Visit: Payer: Self-pay

## 2020-05-08 ENCOUNTER — Other Ambulatory Visit
Admission: RE | Admit: 2020-05-08 | Discharge: 2020-05-08 | Disposition: A | Discharge status: Home / Self Care | Payer: Medicare Other | Source: Ambulatory Visit | Attending: Cardiology | Admitting: Cardiology

## 2020-05-08 DIAGNOSIS — D539 Nutritional anemia, unspecified: Secondary | ICD-10-CM

## 2020-05-08 DIAGNOSIS — R6889 Other general symptoms and signs: Secondary | ICD-10-CM

## 2020-05-08 DIAGNOSIS — M9905 Segmental and somatic dysfunction of pelvic region: Secondary | ICD-10-CM | POA: Diagnosis not present

## 2020-05-08 DIAGNOSIS — M5416 Radiculopathy, lumbar region: Secondary | ICD-10-CM | POA: Diagnosis not present

## 2020-05-08 DIAGNOSIS — Z20822 Contact with and (suspected) exposure to covid-19: Secondary | ICD-10-CM | POA: Diagnosis not present

## 2020-05-08 DIAGNOSIS — M5136 Other intervertebral disc degeneration, lumbar region: Secondary | ICD-10-CM | POA: Diagnosis not present

## 2020-05-08 DIAGNOSIS — D593 Hemolytic-uremic syndrome, unspecified: Secondary | ICD-10-CM

## 2020-05-08 DIAGNOSIS — M9903 Segmental and somatic dysfunction of lumbar region: Secondary | ICD-10-CM | POA: Diagnosis not present

## 2020-05-08 LAB — SARS CORONAVIRUS 2 (TAT 6-24 HRS): SARS Coronavirus 2: NEGATIVE

## 2020-05-10 ENCOUNTER — Encounter (HOSPITAL_COMMUNITY)
Admission: RE | Disposition: A | Discharge status: Home / Self Care | Payer: Self-pay | Source: Home / Self Care | Attending: Cardiology

## 2020-05-10 ENCOUNTER — Ambulatory Visit (HOSPITAL_COMMUNITY): Payer: Medicare Other | Admitting: Anesthesiology

## 2020-05-10 ENCOUNTER — Other Ambulatory Visit: Payer: Self-pay

## 2020-05-10 ENCOUNTER — Encounter (HOSPITAL_COMMUNITY): Payer: Self-pay | Admitting: Cardiology

## 2020-05-10 ENCOUNTER — Ambulatory Visit (HOSPITAL_COMMUNITY)
Admission: RE | Admit: 2020-05-10 | Discharge: 2020-05-10 | Disposition: A | Discharge status: Home / Self Care | Payer: Medicare Other | Attending: Cardiology | Admitting: Cardiology

## 2020-05-10 DIAGNOSIS — Z79899 Other long term (current) drug therapy: Secondary | ICD-10-CM | POA: Insufficient documentation

## 2020-05-10 DIAGNOSIS — Z7901 Long term (current) use of anticoagulants: Secondary | ICD-10-CM | POA: Insufficient documentation

## 2020-05-10 DIAGNOSIS — Z8546 Personal history of malignant neoplasm of prostate: Secondary | ICD-10-CM | POA: Diagnosis not present

## 2020-05-10 DIAGNOSIS — G4730 Sleep apnea, unspecified: Secondary | ICD-10-CM | POA: Insufficient documentation

## 2020-05-10 DIAGNOSIS — I4891 Unspecified atrial fibrillation: Secondary | ICD-10-CM | POA: Insufficient documentation

## 2020-05-10 DIAGNOSIS — I2510 Atherosclerotic heart disease of native coronary artery without angina pectoris: Secondary | ICD-10-CM | POA: Diagnosis not present

## 2020-05-10 DIAGNOSIS — R7303 Prediabetes: Secondary | ICD-10-CM | POA: Diagnosis not present

## 2020-05-10 DIAGNOSIS — K219 Gastro-esophageal reflux disease without esophagitis: Secondary | ICD-10-CM | POA: Insufficient documentation

## 2020-05-10 DIAGNOSIS — I4819 Other persistent atrial fibrillation: Secondary | ICD-10-CM | POA: Diagnosis not present

## 2020-05-10 DIAGNOSIS — E785 Hyperlipidemia, unspecified: Secondary | ICD-10-CM | POA: Diagnosis not present

## 2020-05-10 DIAGNOSIS — I10 Essential (primary) hypertension: Secondary | ICD-10-CM | POA: Diagnosis not present

## 2020-05-10 HISTORY — PX: CARDIOVERSION: SHX1299

## 2020-05-10 SURGERY — CARDIOVERSION
Anesthesia: General

## 2020-05-10 MED ORDER — SODIUM CHLORIDE 0.9 % IV SOLN: INTRAVENOUS | Status: DC | PRN

## 2020-05-10 MED ORDER — METOPROLOL TARTRATE 25 MG PO TABS: 25.0000 mg | ORAL_TABLET | Freq: Two times a day (BID) | ORAL | 11 refills | Status: DC

## 2020-05-10 MED ORDER — PROPOFOL 10 MG/ML IV BOLUS
INTRAVENOUS | Status: DC | PRN
  Administered 2020-05-10: 70 mg via INTRAVENOUS
  Administered 2020-05-10: 20 mg via INTRAVENOUS

## 2020-05-10 MED ORDER — LIDOCAINE 2% (20 MG/ML) 5 ML SYRINGE
INTRAMUSCULAR | Status: DC | PRN
  Administered 2020-05-10: 100 mg via INTRAVENOUS

## 2020-05-10 NOTE — Interval H&P Note (Signed)
History and Physical Interval Note:  05/10/2020 11:54 AM  Antonio Clark  has presented today for surgery, with the diagnosis of AFIB.  The various methods of treatment have been discussed with the patient and family. After consideration of risks, benefits and other options for treatment, the patient has consented to  Procedure(s): CARDIOVERSION (N/A) as a surgical intervention.  The patient's history has been reviewed, patient examined, no change in status, stable for surgery.  I have reviewed the patient's chart and labs.  Questions were answered to the patient's satisfaction.     Freada Bergeron

## 2020-05-10 NOTE — Anesthesia Postprocedure Evaluation (Signed)
Anesthesia Post Note  Patient: Antonio Clark  Procedure(s) Performed: CARDIOVERSION (N/A )     Patient location during evaluation: PACU Anesthesia Type: General Level of consciousness: sedated and patient cooperative Pain management: pain level controlled Vital Signs Assessment: post-procedure vital signs reviewed and stable Respiratory status: spontaneous breathing Cardiovascular status: stable Anesthetic complications: no   No complications documented.  Last Vitals:  Vitals:   05/10/20 1230 05/10/20 1235  BP: 119/62 128/69  Pulse: 82 76  Resp: 20 18  Temp:    SpO2: 95% 96%    Last Pain:  Vitals:   05/10/20 1235  TempSrc:   PainSc: 0-No pain                 Nolon Nations

## 2020-05-10 NOTE — Procedures (Signed)
Procedure: Electrical Cardioversion Indications:  Atrial Fibrillation  Procedure Details:  Consent: Risks of procedure as well as the alternatives and risks of each were explained to the (patient/caregiver).  Consent for procedure obtained.  Time Out: Verified patient identification, verified procedure, site/side was marked, verified correct patient position, special equipment/implants available, medications/allergies/relevent history reviewed, required imaging and test results available. PERFORMED.  Patient placed on cardiac monitor, pulse oximetry, supplemental oxygen as necessary.  Sedation given: Lidocaine 100mg ; propofol 90mg  given by anesthesia Pacer pads placed anterior and posterior chest.  Cardioverted 3 time(s).  Cardioversion with synchronized biphasic 200J shock.  Evaluation: Findings: Post procedure EKG shows: NSR Complications: None Patient did tolerate procedure well.  Time Spent Directly with the Patient:  50minutes   Aryella Besecker E Diontre Harps 05/10/2020, 12:13 PM

## 2020-05-10 NOTE — Transfer of Care (Signed)
Immediate Anesthesia Transfer of Care Note  Patient: Antonio Clark  Procedure(s) Performed: CARDIOVERSION (N/A )  Patient Location: Endoscopy Unit  Anesthesia Type:General  Level of Consciousness: awake, patient cooperative and responds to stimulation  Airway & Oxygen Therapy: Patient Spontanous Breathing and Patient connected to nasal cannula oxygen  Post-op Assessment: Report given to RN and Post -op Vital signs reviewed and stable  Post vital signs: Reviewed and stable  Last Vitals:  Vitals Value Taken Time  BP 126/70 05/10/20 1220  Temp    Pulse 79 05/10/20 1220  Resp 22 05/10/20 1220  SpO2 100 % 05/10/20 1220    Last Pain:  Vitals:   05/10/20 1216  TempSrc:   PainSc: 0-No pain         Complications: No complications documented.

## 2020-05-10 NOTE — Anesthesia Preprocedure Evaluation (Addendum)
Anesthesia Evaluation  Patient identified by MRN, date of birth, ID band Patient awake    Reviewed: Allergy & Precautions, NPO status , Patient's Chart, lab work & pertinent test results  Airway Mallampati: II  TM Distance: >3 FB Neck ROM: Full    Dental  (+) Teeth Intact, Dental Advisory Given,  Multiple shallow blisters on tongue:   Pulmonary sleep apnea (does not use all the time) and Continuous Positive Airway Pressure Ventilation ,    Pulmonary exam normal breath sounds clear to auscultation       Cardiovascular hypertension, Pt. on medications + CAD  + dysrhythmias (on pradaxa- took 5pm yesterday) Atrial Fibrillation  Rhythm:Irregular Rate:Normal  Echo 04/10/2020 1. Inferior basal hypokinesis. Left ventricular ejection fraction, by estimation, is 50 to 55%. The left ventricle has low normal function. The left ventricle demonstrates regional wall motion abnormalities (see scoring diagram/findings for description). Left ventricular diastolic parameters are indeterminate.  2. Right ventricular systolic function is normal. The right ventricular size is normal. There is normal pulmonary artery systolic pressure.  3. Left atrial size was mildly dilated.  4. The mitral valve is normal in structure. Trivial mitral valve regurgitation. No evidence of mitral stenosis.  5. The aortic valve is tricuspid. Aortic valve regurgitation is trivial. Mild aortic valve sclerosis is present, with no evidence of aortic valve stenosis.  6. Aortic dilatation noted. There is mild dilatation of the ascending aorta measuring 39 mm.  7. The inferior vena cava is normal in size with greater than 50% respiratory variability, suggesting right atrial pressure of 3 mmHg.    Neuro/Psych negative neurological ROS  negative psych ROS   GI/Hepatic Neg liver ROS, GERD  Medicated and Controlled,  Endo/Other  diabetes (pre-diabetic), Well Controlled, Type 2   Renal/GU Renal disease  negative genitourinary   Musculoskeletal  (+) Arthritis , Osteoarthritis,    Abdominal   Peds negative pediatric ROS (+)  Hematology negative hematology ROS (+)   Anesthesia Other Findings   Reproductive/Obstetrics                            Anesthesia Physical  Anesthesia Plan  ASA: III  Anesthesia Plan: General   Post-op Pain Management:    Induction: Intravenous  PONV Risk Score and Plan: 2 and Treatment may vary due to age or medical condition, Propofol infusion and TIVA  Airway Management Planned: Natural Airway  Additional Equipment: None  Intra-op Plan:   Post-operative Plan:   Informed Consent: I have reviewed the patients History and Physical, chart, labs and discussed the procedure including the risks, benefits and alternatives for the proposed anesthesia with the patient or authorized representative who has indicated his/her understanding and acceptance.     Dental advisory given  Plan Discussed with: CRNA  Anesthesia Plan Comments:        Anesthesia Quick Evaluation

## 2020-05-10 NOTE — Discharge Instructions (Signed)
Electrical Cardioversion Electrical cardioversion is the delivery of a jolt of electricity to restore a normal rhythm to the heart. A rhythm that is too fast or is not regular keeps the heart from pumping well. In this procedure, sticky patches or metal paddles are placed on the chest to deliver electricity to the heart from a device. This procedure may be done in an emergency if:  There is low or no blood pressure as a result of the heart rhythm.  Normal rhythm must be restored as fast as possible to protect the brain and heart from further damage.  It may save a life. Follow these instructions at home:  Do not drive for 24 hours if you were given a sedative during your procedure.  Take over-the-counter and prescription medicines only as told by your health care provider.  Ask your health care provider how to check your pulse. Check it often.  Rest for 48 hours after the procedure or as told by your health care provider.  Avoid or limit your caffeine use as told by your health care provider.  Keep all follow-up visits as told by your health care provider. This is important. Contact a health care provider if:  You feel like your heart is beating too quickly or your pulse is not regular.  You have a serious muscle cramp that does not go away. Get help right away if:  You have discomfort in your chest.  You are dizzy or you feel faint.  You have trouble breathing or you are short of breath.  Your speech is slurred.  You have trouble moving an arm or leg on one side of your body.  Your fingers or toes turn cold or blue. Summary  Electrical cardioversion is the delivery of a jolt of electricity to restore a normal rhythm to the heart.  This procedure may be done right away in an emergency or may be a scheduled procedure if the condition is not an emergency.  Generally, this is a safe procedure.  After the procedure, check your pulse often as told by your health care  provider.  

## 2020-05-12 ENCOUNTER — Encounter (HOSPITAL_COMMUNITY): Payer: Self-pay | Admitting: Cardiology

## 2020-05-13 DIAGNOSIS — M5136 Other intervertebral disc degeneration, lumbar region: Secondary | ICD-10-CM | POA: Diagnosis not present

## 2020-05-13 DIAGNOSIS — M9905 Segmental and somatic dysfunction of pelvic region: Secondary | ICD-10-CM | POA: Diagnosis not present

## 2020-05-13 DIAGNOSIS — M5416 Radiculopathy, lumbar region: Secondary | ICD-10-CM | POA: Diagnosis not present

## 2020-05-13 DIAGNOSIS — M9903 Segmental and somatic dysfunction of lumbar region: Secondary | ICD-10-CM | POA: Diagnosis not present

## 2020-05-13 NOTE — Chronic Care Management (AMB) (Addendum)
Chronic Care Management Pharmacy  Name: Antonio Clark  MRN: [id number] DOB: September 26, 1935  Chief Complaint/ HPI  Antonio Clark,  84 y.o. , male presents for their Initial CCM visit with the clinical pharmacist In office.  PCP : Susy Frizzle, MD  Their chronic conditions include: HTN, CAD, Afib, HLD, borderline DM.  Office Visits:  08/17/2019 ([facility]) -  Diabetes follow up Pravastatin was stopped and started on Lipitor 27m, history of CAD  Consult Visit: 03/08/2020 ([place], Cardio) -  No medication changes Patient was complaining of low energy  05/10/2020  Patient had cardioversion done successfully  Medications: Outpatient Encounter Medications as of 05/14/2020  Medication Sig   diphenhydramine-acetaminophen (TYLENOL PM) 25-500 MG TABS tablet Take 1 tablet by mouth at bedtime as needed (sleep).   hydrochlorothiazide (HYDRODIURIL) 25 MG tablet TAKE 1 TABLET BY MOUTH EVERY DAY (Patient taking differently: Take 25 mg by mouth daily. )   metoprolol tartrate (LOPRESSOR) 25 MG tablet Take 1 tablet (25 mg total) by mouth 2 (two) times daily.   multivitamin (THERAGRAN) per tablet Take 1 tablet by mouth daily.     Omega-3 Fatty Acids (FISH OIL) 1200 MG CAPS Take 1,200 mg by mouth daily.     omeprazole (PRILOSEC) 20 MG capsule Take 20 mg by mouth as needed (heartburn).   PRADAXA 150 MG CAPS capsule TAKE 1 CAPSULE BY MOUTH TWICE A DAY (Patient taking differently: Take 150 mg by mouth 2 (two) times daily. )   pravastatin (PRAVACHOL) 40 MG tablet Take 40 mg by mouth at bedtime.   Tamsulosin HCl (FLOMAX) 0.4 MG CAPS Take 0.4 mg by mouth daily.     valsartan (DIOVAN) 320 MG tablet TAKE 1 TABLET BY MOUTH EVERY DAY (Patient taking differently: Take 320 mg by mouth daily. )   No facility-administered encounter medications on file as of 05/14/2020.     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    Difficulty of Paying Living Expenses: Not very hard    Goals  Addressed             This Visit's Progress    Pharmacy Care Plan:       CARE PLAN ENTRY (see longitudinal plan of care for additional care plan information)  Current Barriers:  Chronic Disease Management support, education, and care coordination needs related to Hypertension, Hyperlipidemia, Diabetes, and Atrial Fibrillation   Hypertension BP Readings from Last 3 Encounters:  05/10/20 124/61  05/06/20 (!) 128/58  04/17/20 (!) 125/55   Pharmacist Clinical Goal(s): Over the next 90 days, patient will work with PharmD and providers to maintain BP goal <130/80 Current regimen:  HCTZ 242mdaily Metoprolol tartrate 25 mg bid Valsartan 32075maily Interventions: Reviewed home BP readings Discussed medication adherence Patient self care activities - Over the next 90 days, patient will: Check BP daily, document, and provide at future appointments Ensure daily salt intake < 2300 mg/day  Hyperlipidemia Lab Results  Component [facility] Date/Time   [facility]   Pharmacist Clinical Goal(s): Over the next 90 days, patient will work with PharmD and providers to achieve LDL goal < 70 Current regimen:  Atorvastatin 70m66mily Interventions: Reviewed most recent lipid panel Discussed dietary modifications Patient self care activities - Over the next 90 days, patient will: Continue to take medication as directed Report to PCP for updated labs  Diabetes Lab Results  Component Value Date/Time   HGBA1C 7.0 (H) 08/14/2019 08:56 AM   HGBA1C 6.6 (H)  02/09/2019 08:16 AM   Pharmacist Clinical Goal(s): Over the next 90 days, patient will work with PharmD and providers to achieve A1c goal <7% Current regimen:  Atorvastatin 74m Interventions: Reviewed most recent A1c Discussed current dietary modifications Patient self care activities - Over the next 90 days, patient will: Check blood sugar at physical in office, document, and provide at future appointments Work on  dietary modifications limiting carbohydrates and snack foods.  Afib Pharmacist Clinical Goal(s) Over the next 90 days, patient will work with PharmD and providers to optimize medication and minimize symptoms of Afib. Current regimen:  Metoprolol tartrate 246mbid Pradaxa 15028mid Interventions: Discussed addition of metoprolol and what to expect Counseled on abnormal bruising and bleeding Patient self care activities - Over the next 90 days, patient will: Continue to take medication as directed Contact providers with and change in symptoms   Initial goal documentation         Hypertension   BP goal is:  <130/80  Office blood pressures are  BP Readings from Last 3 Encounters:  05/10/20 124/61  05/06/20 (!) 128/58  04/17/20 (!) 125/55   Patient checks BP at home daily Patient home BP readings are ranging: 119/60s-70 per patient  Patient has failed these meds in the past: none noted Patient is currently controlled on the following medications:  HCTZ 8m91mily Metoprolol tartrate 25 mg bid Valsartan 320mg81mly  Patient recently started metoprolol tartrate 8mg 97mpost cardioversion.  He is tolerating well with no adverse effects.  States HR is WNL 60-70, denies dizziness or headaches.  Counseled on medication adherence.  Plan  Continue current medications, and home monitoring.     Diabetes   A1c goal <7%  Recent Relevant Labs: Lab Results  Component Value Date/Time   HGBA1C 7.0 (H) 08/14/2019 08:56 AM   HGBA1C 6.6 (H) 02/09/2019 08:16 AM   GFR 78.21 08/21/2013 12:19 PM    Last diabetic Eye exam: No results found for: HMDIABEYEEXA  Last diabetic Foot exam: No results found for: HMDIABFOOTEX   Checking BG: Never   Patient has failed these meds in past: none noted Patient is currently uncontrolled on the following medications: No medications  Counseled on diabetic diet limiting carbohydrates.  Patient has follow up physical with Dr. PickarDennard Schaumanne end  of the month.  Due for updated A1c.  If A1c remains elevated or above 7 would recommend initiation of metformin 500mg d9m.  Plan  Continue current medications, recommend updated labs to determine future steps.  Hyperlipidemia   LDL goal < 70  Lipid Panel     Component Value Date/Time   CHOL 167 08/14/2019 0856   TRIG 184 (H) 08/14/2019 0856   HDL 45 08/14/2019 0856   LDLCALC 94 08/14/2019 0856    Hepatic Function Latest Ref Rng & Units 08/14/2019 06/19/2019 02/09/2019  Total Protein 6.1 - 8.1 g/dL 6.6 6.7 6.4  Albumin 3.5 - 5.0 g/dL - 4.2 -  AST 10 - 35 U/L '15 22 14  ' ALT 9 - 46 U/L '11 20 12  ' Alk Phosphatase 38 - 126 U/L - 45 -  Total Bilirubin 0.2 - 1.2 mg/dL 0.4 0.7 0.6  Bilirubin, Direct 0.0 - 0.3 mg/dL - - -     The ASCVD Risk score (Goff DMikey Bussing, et al., 2013) failed to calculate for the following reasons:   The 2013 ASCVD risk score is only valid for ages 40 to 772  Pa68ent has failed these meds in past: none  noted Patient is currently uncontrolled on the following medications:  Atorvastatin 73m daily  Switched pravastatin 447mto atorvastatin 4063mn hopes of bringing LDL towards goal of < 70.  Denies myalgias.  Due for repeat lipid panel.  Plan  Continue current medications, updated med list to reflect change.  AFIB   Patient is currently rate controlled. HR 60-70 BPM  Patient has failed these meds in past: none noted Patient is currently controlled on the following medications:  Metoprolol tartrate 25m61md Pradaxa 150mg34m  Recently started metoprolol with no issues.  Denies abnormal bleeding/bruising, however his RBC and Hgb are low.  Patient was in lab today for stool sample to ensure there is no blood in the stool.  Will f/u with results, otherwise continue.  Plan  Continue current medications   CAD   Patient has failed these meds in past: none noted Patient is currently controlled on the following medications:  None  Controlling risk factors,  LDL needs to be closer to 70.  Patient working on dietary modifications.  Plan  Continue current medications Vaccines   Reviewed and discussed patient's vaccination history.    Immunization History  Administered Date(s) Administered   Influenza, High Dose Seasonal PF 06/07/2017, 05/07/2018   Influenza,inj,Quad PF,6+ Mos 05/12/2016   Influenza-Unspecified 05/30/2015   PFIZER SARS-COV-2 Vaccination 09/01/2019, 09/21/2019   Pneumococcal Conjugate-13 07/08/2015   Pneumococcal Polysaccharide-23 01/20/2016   Tdap 06/11/2011   Zoster Recombinat (Shingrix) 07/09/2017, 09/30/2017    Plan  Recommended patient receive flu vaccine in office.    Medication Management   Miscellaneous medications:  tamsulosin 0.4mg d48my Omeprazole 20mg p25mTC's:  Tylenol PM Patient currently uses [facility].  Phone #  [phone] reports using pill box method to organize medications and promote adherence. Patient denies missed doses of medication.   ChristiBeverly MilchD Clinical Pharmacist [place] [phone] collaborated with the care management provider regarding care management and care coordination activities outlined in this encounter and have reviewed this encounter including documentation in the note and care plan. I am certifying that I agree with the content of this note and encounter as supervising physician.

## 2020-05-14 ENCOUNTER — Ambulatory Visit: Payer: Medicare Other | Admitting: Pharmacist

## 2020-05-14 ENCOUNTER — Other Ambulatory Visit: Payer: Medicare Other

## 2020-05-14 ENCOUNTER — Other Ambulatory Visit: Payer: Self-pay

## 2020-05-14 DIAGNOSIS — I10 Essential (primary) hypertension: Secondary | ICD-10-CM

## 2020-05-14 DIAGNOSIS — R6889 Other general symptoms and signs: Secondary | ICD-10-CM | POA: Diagnosis not present

## 2020-05-14 DIAGNOSIS — I4891 Unspecified atrial fibrillation: Secondary | ICD-10-CM

## 2020-05-14 DIAGNOSIS — D593 Hemolytic-uremic syndrome, unspecified: Secondary | ICD-10-CM

## 2020-05-14 DIAGNOSIS — R7303 Prediabetes: Secondary | ICD-10-CM

## 2020-05-14 DIAGNOSIS — E785 Hyperlipidemia, unspecified: Secondary | ICD-10-CM

## 2020-05-14 DIAGNOSIS — D539 Nutritional anemia, unspecified: Secondary | ICD-10-CM | POA: Diagnosis not present

## 2020-05-14 NOTE — Patient Instructions (Addendum)
Visit Information Thank you for meeting with me today!  I look forward to working with you to help you meet all of your healthcare goals and answer any questions you may have.  Feel free to contact me anytime!  Goals Addressed            This Visit's Progress   . Pharmacy Care Plan:       CARE PLAN ENTRY (see longitudinal plan of care for additional care plan information)  Current Barriers:  . Chronic Disease Management support, education, and care coordination needs related to Hypertension, Hyperlipidemia, Diabetes, and Atrial Fibrillation   Hypertension BP Readings from Last 3 Encounters:  05/10/20 124/61  05/06/20 (!) 128/58  04/17/20 (!) 125/55   . Pharmacist Clinical Goal(s): o Over the next 90 days, patient will work with PharmD and providers to maintain BP goal <130/80 . Current regimen:  . HCTZ 25mg  daily . Metoprolol tartrate 25 mg bid . Valsartan 320mg  daily . Interventions: o Reviewed home BP readings o Discussed medication adherence . Patient self care activities - Over the next 90 days, patient will: o Check BP daily, document, and provide at future appointments o Ensure daily salt intake < 2300 mg/day  Hyperlipidemia Lab Results  Component Value Date/Time   LDLCALC 94 08/14/2019 08:56 AM   . Pharmacist Clinical Goal(s): o Over the next 90 days, patient will work with PharmD and providers to achieve LDL goal < 70 . Current regimen:  o Atorvastatin 40mg  daily . Interventions: o Reviewed most recent lipid panel o Discussed dietary modifications . Patient self care activities - Over the next 90 days, patient will: o Continue to take medication as directed o Report to PCP for updated labs  Diabetes Lab Results  Component Value Date/Time   HGBA1C 7.0 (H) 08/14/2019 08:56 AM   HGBA1C 6.6 (H) 02/09/2019 08:16 AM   . Pharmacist Clinical Goal(s): o Over the next 90 days, patient will work with PharmD and providers to achieve A1c goal <7% . Current  regimen:  o Atorvastatin 40mg  . Interventions: o Reviewed most recent A1c o Discussed current dietary modifications . Patient self care activities - Over the next 90 days, patient will: o Check blood sugar at physical in office, document, and provide at future appointments o Work on dietary modifications limiting carbohydrates and snack foods.  Afib . Pharmacist Clinical Goal(s) o Over the next 90 days, patient will work with PharmD and providers to optimize medication and minimize symptoms of Afib. . Current regimen:   Metoprolol tartrate 25mg  bid  Pradaxa 150mg  bid . Interventions: o Discussed addition of metoprolol and what to expect o Counseled on abnormal bruising and bleeding . Patient self care activities - Over the next 90 days, patient will: o Continue to take medication as directed o Contact providers with and change in symptoms   Initial goal documentation        Mr. Gilkerson was given information about Chronic Care Management services today including:  1. CCM service includes personalized support from designated clinical staff supervised by his physician, including individualized plan of care and coordination with other care providers 2. 24/7 contact phone numbers for assistance for urgent and routine care needs. 3. Standard insurance, coinsurance, copays and deductibles apply for chronic care management only during months in which we provide at least 20 minutes of these services. Most insurances cover these services at 100%, however patients may be responsible for any copay, coinsurance and/or deductible if applicable. This service may help you  avoid the need for more expensive face-to-face services. 4. Only one practitioner may furnish and bill the service in a calendar month. 5. The patient may stop CCM services at any time (effective at the end of the month) by phone call to the office staff.  Patient agreed to services and verbal consent obtained.   The patient  verbalized understanding of instructions provided today and agreed to receive a mailed copy of patient instruction and/or educational materials. Telephone follow up appointment with pharmacy team member scheduled for: 6 months  Beverly Milch, PharmD Clinical Pharmacist [place] [phone]   Carbohydrate Counting for Diabetes Mellitus, Adult  Carbohydrate counting is a method of keeping track of how many carbohydrates you eat. Eating carbohydrates naturally increases the amount of sugar (glucose) in the blood. Counting how many carbohydrates you eat helps keep your blood glucose within normal limits, which helps you manage your diabetes (diabetes mellitus). It is important to know how many carbohydrates you can safely have in each meal. This is different for every person. A diet and nutrition specialist (registered dietitian) can help you make a meal plan and calculate how many carbohydrates you should have at each meal and snack. Carbohydrates are found in the following foods:  Grains, such as breads and cereals.  Dried beans and soy products.  Starchy vegetables, such as potatoes, peas, and corn.  Fruit and fruit juices.  Milk and yogurt.  Sweets and snack foods, such as cake, cookies, candy, chips, and soft drinks. How do I count carbohydrates? There are two ways to count carbohydrates in food. You can use either of the methods or a combination of both. Reading "Nutrition Facts" on packaged food The "Nutrition Facts" list is included on the labels of almost all packaged foods and beverages in the [place] It includes:  The serving size.  Information about nutrients in each serving, including the grams (g) of carbohydrate per serving. To use the "Nutrition Facts":  Decide how many servings you will have.  Multiply the number of servings by the number of carbohydrates per serving.  The resulting number is the total amount of carbohydrates that you will be  having. Learning standard serving sizes of other foods When you eat carbohydrate foods that are not packaged or do not include "Nutrition Facts" on the label, you need to measure the servings in order to count the amount of carbohydrates:  Measure the foods that you will eat with a food scale or measuring cup, if needed.  Decide how many standard-size servings you will eat.  Multiply the number of servings by 15. Most carbohydrate-rich foods have about 15 g of carbohydrates per serving. ? For example, if you eat 8 oz (170 g) of strawberries, you will have eaten 2 servings and 30 g of carbohydrates (2 servings x 15 g = 30 g).  For foods that have more than one food mixed, such as soups and casseroles, you must count the carbohydrates in each food that is included. The following list contains standard serving sizes of common carbohydrate-rich foods. Each of these servings has about 15 g of carbohydrates:   hamburger bun or  English muffin.   oz (15 mL) syrup.   oz (14 g) jelly.  1 slice of bread.  1 six-inch tortilla.  3 oz (85 g) cooked rice or pasta.  4 oz (113 g) cooked dried beans.  4 oz (113 g) starchy vegetable, such as peas, corn, or potatoes.  4 oz (113 g)  hot cereal.  4 oz (113 g) mashed potatoes or  of a large baked potato.  4 oz (113 g) canned or frozen fruit.  4 oz (120 mL) fruit juice.  4-6 crackers.  6 chicken nuggets.  6 oz (170 g) unsweetened dry cereal.  6 oz (170 g) plain fat-free yogurt or yogurt sweetened with artificial sweeteners.  8 oz (240 mL) milk.  8 oz (170 g) fresh fruit or one small piece of fruit.  24 oz (680 g) popped popcorn. Example of carbohydrate counting Sample meal  3 oz (85 g) chicken breast.  6 oz (170 g) brown rice.  4 oz (113 g) corn.  8 oz (240 mL) milk.  8 oz (170 g) strawberries with sugar-free whipped topping. Carbohydrate calculation 1. Identify the foods that contain  carbohydrates: ? Rice. ? Corn. ? Milk. ? Strawberries. 2. Calculate how many servings you have of each food: ? 2 servings rice. ? 1 serving corn. ? 1 serving milk. ? 1 serving strawberries. 3. Multiply each number of servings by 15 g: ? 2 servings rice x 15 g = 30 g. ? 1 serving corn x 15 g = 15 g. ? 1 serving milk x 15 g = 15 g. ? 1 serving strawberries x 15 g = 15 g. 4. Add together all of the amounts to find the total grams of carbohydrates eaten: ? 30 g + 15 g + 15 g + 15 g = 75 g of carbohydrates total. Summary  Carbohydrate counting is a method of keeping track of how many carbohydrates you eat.  Eating carbohydrates naturally increases the amount of sugar (glucose) in the blood.  Counting how many carbohydrates you eat helps keep your blood glucose within normal limits, which helps you manage your diabetes.  A diet and nutrition specialist (registered dietitian) can help you make a meal plan and calculate how many carbohydrates you should have at each meal and snack. This information is not intended to replace advice given to you by your health care provider. Make sure you discuss any questions you have with your health care provider. Document Revised: 02/18/2017 Document Reviewed: 01/08/2016 Elsevier Patient Education  [place].

## 2020-05-15 ENCOUNTER — Ambulatory Visit (HOSPITAL_COMMUNITY)
Admission: RE | Admit: 2020-05-15 | Discharge: 2020-05-15 | Disposition: A | Discharge status: Home / Self Care | Payer: Medicare Other | Source: Ambulatory Visit | Attending: Nurse Practitioner | Admitting: Nurse Practitioner

## 2020-05-15 ENCOUNTER — Encounter (HOSPITAL_COMMUNITY): Payer: Self-pay | Admitting: Nurse Practitioner

## 2020-05-15 VITALS — BP 136/70 | HR 69 | Ht 69.0 in | Wt 192.6 lb

## 2020-05-15 DIAGNOSIS — I4819 Other persistent atrial fibrillation: Secondary | ICD-10-CM

## 2020-05-15 DIAGNOSIS — Z808 Family history of malignant neoplasm of other organs or systems: Secondary | ICD-10-CM | POA: Insufficient documentation

## 2020-05-15 DIAGNOSIS — Z8546 Personal history of malignant neoplasm of prostate: Secondary | ICD-10-CM | POA: Diagnosis not present

## 2020-05-15 DIAGNOSIS — Z833 Family history of diabetes mellitus: Secondary | ICD-10-CM | POA: Diagnosis not present

## 2020-05-15 DIAGNOSIS — Z85828 Personal history of other malignant neoplasm of skin: Secondary | ICD-10-CM | POA: Diagnosis not present

## 2020-05-15 DIAGNOSIS — D6869 Other thrombophilia: Secondary | ICD-10-CM

## 2020-05-15 DIAGNOSIS — R7303 Prediabetes: Secondary | ICD-10-CM | POA: Insufficient documentation

## 2020-05-15 DIAGNOSIS — K219 Gastro-esophageal reflux disease without esophagitis: Secondary | ICD-10-CM | POA: Diagnosis not present

## 2020-05-15 DIAGNOSIS — E785 Hyperlipidemia, unspecified: Secondary | ICD-10-CM | POA: Diagnosis not present

## 2020-05-15 DIAGNOSIS — Z8249 Family history of ischemic heart disease and other diseases of the circulatory system: Secondary | ICD-10-CM | POA: Insufficient documentation

## 2020-05-15 DIAGNOSIS — Z8042 Family history of malignant neoplasm of prostate: Secondary | ICD-10-CM | POA: Insufficient documentation

## 2020-05-15 DIAGNOSIS — I10 Essential (primary) hypertension: Secondary | ICD-10-CM | POA: Diagnosis not present

## 2020-05-15 DIAGNOSIS — I4891 Unspecified atrial fibrillation: Secondary | ICD-10-CM | POA: Diagnosis not present

## 2020-05-15 DIAGNOSIS — Z79899 Other long term (current) drug therapy: Secondary | ICD-10-CM | POA: Diagnosis not present

## 2020-05-15 DIAGNOSIS — Z823 Family history of stroke: Secondary | ICD-10-CM | POA: Insufficient documentation

## 2020-05-15 LAB — VITAMIN B12: Vitamin B-12: 283 pg/mL (ref 200–1100)

## 2020-05-15 LAB — IRON,TIBC AND FERRITIN PANEL
%SAT: 16 % (calc) — ABNORMAL LOW (ref 20–48)
Ferritin: 19 ng/mL — ABNORMAL LOW (ref 24–380)
Iron: 63 ug/dL (ref 50–180)
TIBC: 394 mcg/dL (calc) (ref 250–425)

## 2020-05-15 NOTE — Progress Notes (Signed)
Primary Care Physician: Susy Frizzle, MD Referring Physician: Dr. Clydene Fake Antonio Clark is a 84 y.o. male with a h/o afib, s/p afib ablation 04/17/20. The first week, he noted that his HR's were stable in the 60's and 70's. For the last 2 weeks he has noted persistently  elevated HR's around 100 bpm. He is not on rate control as he  issues   in the past having soft BP's. He had a systolic BP of 99/37 earlier today. He does feel fatigue with the elevated HR"s. Ekg  shows afib at 108 bpm. He continues on Pradaza with a CHA2DS2VASc score of 4. States  no missed anticoagulation.   F/u in afib, 05/15/20. He had a successful cardioversion 05/10/20. He had to be shocked 3 times. Dr. Johney Frame placed pt on BB for frequent PAC's after cardioversion. He is in SR today. He feels improved.   Today, he denies symptoms of palpitations, chest pain, shortness of breath, orthopnea, PND, lower extremity edema, dizziness, presyncope, syncope, or neurologic sequela. The patient is tolerating medications without difficulties and is otherwise without complaint today.   Past Medical History:  Diagnosis Date  . Arthritis   . Atrial fibrillation ([place])   . Back pain   . Borderline diabetic   . Dysrhythmia    a-fib  . GERD (gastroesophageal reflux disease)   . History of echocardiogram 07/ 07/ 2011  . History of lithotripsy 1989  . Hyperlipidemia   . Hypertension   . Melanoma ([place]) 05/01/2019   right chest wall (12/20)  . Pre-diabetes   . Prostate CA ([place]) 2010  . Sleep apnea    uses CPAP nightly  . Squamous cell carcinoma of skin 05/23/1992   bowens-left parietal scalp (CX35FU)  . Squamous cell carcinoma of skin 03/14/2008   in situ-left upper outer forehead-medial (CX35FU)  . Squamous cell carcinoma of skin 03/14/2008   in situ-crown of scalp (Cx35FU)  . Squamous cell carcinoma of skin 04/15/2011   in situ-right dorsal forearm (txpbx)  . Squamous cell carcinoma of skin 10/16/2011   in situ-left  sideburn  . Squamous cell carcinoma of skin 03/11/2015   ka-left sideburn (CX35FU)  . Squamous cell carcinoma of skin 03/11/2015   ka-left forearm (CX35FU)  . Squamous cell carcinoma of skin 06/22/2018   in situ-left forearm, sup (txpbx)  . Squamous cell carcinoma of skin 05/01/2019   in situ-mid anterior scalp   . Squamous cell carcinoma of skin 05/01/2019   in situ-right upper arm   Past Surgical History:  Procedure Laterality Date  . ATRIAL FIBRILLATION ABLATION N/A 04/17/2020   Procedure: ATRIAL FIBRILLATION ABLATION;  Surgeon: Vickie Epley, MD;  Location: [place] [facility];  Service: Cardiovascular;  Laterality: N/A;  . AXILLARY SENTINEL NODE BIOPSY Right 08/23/2019   Procedure: SENTINEL LYMPH NODE BIOSPY RIGHT AXILLA;  Surgeon: Stark Klein, MD;  Location: [place];  Service: General;  Laterality: Right;  . CARDIOVERSION N/A 05/10/2020   Procedure: CARDIOVERSION;  Surgeon: Freada Bergeron, MD;  Location: [facility]OS[facility];  Service: Cardiovascular;  Laterality: N/A;  . COLONOSCOPY  2002  . FASCIECTOMY Left 03/28/2019   Procedure: SEGMENTAL FASCIECTOMY LEFT RING FINGER;  Surgeon: Daryll Brod, MD;  Location: [place];  Service: Orthopedics;  Laterality: Left;  ANESTHESIA  AXILLARY BLOCK  . INSERTION PROSTATE RADIATION SEED  8 4 2010   per Dr Rosana Hoes  . MELANOMA EXCISION Right 06/22/2019   Procedure: WIDE LOCAL EXCISION RIGHT CHEST  WALL MELANOMA, ADVANCEMENT FLAP CLOSURE FOR DEFECT 3X6 CM;  Surgeon: Stark Klein, MD;  Location: [facility];  Service: General;  Laterality: Right;  . POLYPECTOMY  2002  . THROAT SURGERY  1980   benign cyst  . URETHRAL STRICTURE DILATATION     also penile implant    Current Outpatient Medications  Medication Sig Dispense Refill  . diphenhydramine-acetaminophen (TYLENOL PM) 25-500 MG TABS tablet Take 1 tablet by mouth at bedtime as needed (sleep).    . hydrochlorothiazide (HYDRODIURIL) 25 MG tablet  TAKE 1 TABLET BY MOUTH EVERY DAY 30 tablet 9  . metoprolol tartrate (LOPRESSOR) 25 MG tablet Take 1 tablet (25 mg total) by mouth 2 (two) times daily. 60 tablet 11  . multivitamin (THERAGRAN) per tablet Take 1 tablet by mouth daily.      . Omega-3 Fatty Acids (FISH OIL) 1200 MG CAPS Take 1,200 mg by mouth daily.      Marland Kitchen omeprazole (PRILOSEC) 20 MG capsule Take 20 mg by mouth as needed (heartburn).    Marland Kitchen PRADAXA 150 MG CAPS capsule TAKE 1 CAPSULE BY MOUTH TWICE A DAY 60 capsule 5  . pravastatin (PRAVACHOL) 40 MG tablet Take 40 mg by mouth at bedtime.    . Tamsulosin HCl (FLOMAX) 0.4 MG CAPS Take 0.4 mg by mouth daily.      . valsartan (DIOVAN) 320 MG tablet TAKE 1 TABLET BY MOUTH EVERY DAY 90 tablet 2   No current facility-administered medications for this encounter.    No Known Allergies  Social History   Socioeconomic History  . Marital status: Single    Spouse name: Not on file  . Number of children: Not on file  . Years of education: Not on file  . Highest education level: Not on file  Occupational History  . Occupation: retired  Tobacco Use  . Smoking status: Never Smoker  . Smokeless tobacco: Never Used  Vaping Use  . Vaping Use: Never used  Substance and Sexual Activity  . Alcohol use: Yes    Comment: social  . Drug use: No  . Sexual activity: Yes  Other Topics Concern  . Not on file  Social History Narrative  . Not on file   Social Determinants of Health   Financial Resource Strain: Low Risk   . Difficulty of Paying Living Expenses: Not very hard  Food Insecurity:   . Worried About Charity fundraiser in the Last Year: Not on file  . Ran Out of Food in the Last Year: Not on file  Transportation Needs:   . Lack of Transportation (Medical): Not on file  . Lack of Transportation (Non-Medical): Not on file  Physical Activity:   . Days of Exercise per Week: Not on file  . Minutes of Exercise per Session: Not on file  Stress:   . Feeling of Stress : Not on file    Social Connections:   . Frequency of Communication with Friends and Family: Not on file  . Frequency of Social Gatherings with Friends and Family: Not on file  . Attends Religious Services: Not on file  . Active Member of Clubs or Organizations: Not on file  . Attends Archivist Meetings: Not on file  . Marital Status: Not on file  Intimate Partner Violence:   . Fear of Current or Ex-Partner: Not on file  . Emotionally Abused: Not on file  . Physically Abused: Not on file  . Sexually Abused: Not on file  Family History  Problem Relation Age of Onset  . Coronary artery disease Brother        3 brothers had CABG  . Arrhythmia Brother   . Diabetes Brother   . Hearing loss Brother   . Hypertension Brother   . Heart disease Brother   . Heart failure Brother   . Hypertension Brother   . Heart disease Father   . Hypertension Father   . Sudden death Father   . Stroke Mother        cerebral hemorrage  . Colon cancer Mother 53       small intestine  . Cancer Mother   . Suicidality Sister   . Stroke Sister   . Diabetes Brother   . Hypertension Brother   . Hyperlipidemia Sister   . Esophageal cancer Neg Hx   . Pancreatic cancer Neg Hx   . Prostate cancer Neg Hx   . Rectal cancer Neg Hx   . Stomach cancer Neg Hx     ROS- All systems are reviewed and negative except as per the HPI above  Physical Exam: Vitals:   05/15/20 1432  BP: 136/70  Pulse: 69  Weight: 87.4 kg  Height: 5\' 9"  (1.753 m)   Wt Readings from Last 3 Encounters:  05/15/20 87.4 kg  05/10/20 88 kg  05/06/20 88 kg    Labs: Lab Results  Component Value Date   NA 139 05/06/2020   K 4.1 05/06/2020   CL 106 05/06/2020   CO2 22 05/06/2020   GLUCOSE 114 (H) 05/06/2020   BUN 19 05/06/2020   CREATININE 1.40 (H) 05/06/2020   CALCIUM 9.1 05/06/2020   Lab Results  Component Value Date   INR 1.0 03/06/2009   Lab Results  Component Value Date   CHOL 167 08/14/2019   HDL 45 08/14/2019    LDLCALC 94 08/14/2019   TRIG 184 (H) 08/14/2019     GEN- The patient is well appearing, alert and oriented x 3 today.   Head- normocephalic, atraumatic Eyes-  Sclera clear, conjunctiva pink Ears- hearing intact Oropharynx- clear Neck- supple, no JVP Lymph- no cervical lymphadenopathy Lungs- Clear to ausculation bilaterally, normal work of breathing Heart-  regular rate and rhythm, no murmurs, rubs or gallops, PMI not laterally displaced GI- soft, NT, ND, + BS Extremities- no clubbing, cyanosis, or edema MS- no significant deformity or atrophy Skin- no rash or lesion Psych- euthymic mood, full affect Neuro- strength and sensation are intact  EKG- normal sinus rhythm, at 69 bpm, pr int 196 ms, qrs int 82 ms, qtc 443     Assessment and Plan: 1. afib S/p ablation 04/17/20 Initially in SR x one week after ablation, it sounds as he has been persistently in afib x 2 weeks Successful cardioversion, PAC's present after cardioversion and metoprolol 25 mg bid started and is in SR today and tolerating well.  2. CHA2DS2VASc score of 4 States no missed doses of Pradzxa 150 mg bid for at least 3 weeks   F/u with Truitt Merle NP, 10/26 and Dr. Quentin Ore 12/8 afib clinic as needed   Geroge Baseman. Michalle Rademaker, [facility] [address] [place], [place] [address] [phone]

## 2020-05-22 ENCOUNTER — Other Ambulatory Visit: Payer: Self-pay | Admitting: *Deleted

## 2020-05-22 DIAGNOSIS — D539 Nutritional anemia, unspecified: Secondary | ICD-10-CM

## 2020-05-22 DIAGNOSIS — D593 Hemolytic-uremic syndrome, unspecified: Secondary | ICD-10-CM

## 2020-05-22 DIAGNOSIS — E7800 Pure hypercholesterolemia, unspecified: Secondary | ICD-10-CM

## 2020-05-22 DIAGNOSIS — I4891 Unspecified atrial fibrillation: Secondary | ICD-10-CM

## 2020-05-22 DIAGNOSIS — E785 Hyperlipidemia, unspecified: Secondary | ICD-10-CM

## 2020-05-22 DIAGNOSIS — I10 Essential (primary) hypertension: Secondary | ICD-10-CM

## 2020-05-22 DIAGNOSIS — R7303 Prediabetes: Secondary | ICD-10-CM

## 2020-05-27 NOTE — Progress Notes (Signed)
CARDIOLOGY OFFICE NOTE  Date:  06/04/2020    Antonio Clark Date of Birth: 04-Jan-1936 Medical Record #[id number]  PCP:  Susy Frizzle, MD  Cardiologist:  [facility]  Chief Complaint  Patient presents with  . Follow-up    Seen for Dr. Kathe Mariner    History of Present Illness: Antonio Clark is a 84 y.o. male who presents today for a follow up visit. Seen for Dr. Marlou Porch and Dr. Quentin Ore - has also followed in the AF clinic as well.   He has a history of known PAF with AF ablation last month - with elevated HR's a few weeks afterwards with soft BP - found to have recurrent AF. He was cardioverted earlier this month - 3 shocks - beta blocker was added.   Other issues include HTN and HLD. Strong FH for CAD. Remote Cardiolite in 2013 was normal. He has been found to have OSA - but CPAP made him claustrophobic. Placed on Eliquis- changed to Pradaxa due to insurance coverage.  Comes in today. Here alone. He notes more fatigue. BP low. No energy. Blood count also dropping. 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 His rhythm has been good.    Past Medical History:  Diagnosis Date  . Arthritis   . Atrial fibrillation ([place])   . Back pain   . Borderline diabetic   . Dysrhythmia    a-fib  . GERD (gastroesophageal reflux disease)   . History of echocardiogram 07/ 07/ 2011  . History of lithotripsy 1989  . Hyperlipidemia   . Hypertension   . Melanoma ([place]) 05/01/2019   right chest wall (12/20)  . Pre-diabetes   . Prostate CA ([place]) 2010  . Sleep apnea    uses CPAP nightly  . Squamous cell carcinoma of skin 05/23/1992   bowens-left parietal scalp (CX35FU)  . Squamous cell carcinoma of skin 03/14/2008   in situ-left upper outer forehead-medial (CX35FU)  . Squamous cell carcinoma of skin 03/14/2008   in situ-crown of scalp (Cx35FU)  . Squamous cell carcinoma of skin 04/15/2011   in situ-right dorsal forearm (txpbx)    . Squamous cell carcinoma of skin 10/16/2011   in situ-left sideburn  . Squamous cell carcinoma of skin 03/11/2015   ka-left sideburn (CX35FU)  . Squamous cell carcinoma of skin 03/11/2015   ka-left forearm (CX35FU)  . Squamous cell carcinoma of skin 06/22/2018   in situ-left forearm, sup (txpbx)  . Squamous cell carcinoma of skin 05/01/2019   in situ-mid anterior scalp   . Squamous cell carcinoma of skin 05/01/2019   in situ-right upper arm    Past Surgical History:  Procedure Laterality Date  . ATRIAL FIBRILLATION ABLATION N/A 04/17/2020   Procedure: ATRIAL FIBRILLATION ABLATION;  Surgeon: Vickie Epley, MD;  Location: [place] CV LAB;  Service: Cardiovascular;  Laterality: N/A;  . AXILLARY SENTINEL NODE BIOPSY Right 08/23/2019   Procedure: SENTINEL LYMPH NODE BIOSPY RIGHT AXILLA;  Surgeon: Stark Klein, MD;  Location: [place] [facility];  Service: General;  Laterality: Right;  . CARDIOVERSION N/A 05/10/2020   Procedure: CARDIOVERSION;  Surgeon: Freada Bergeron, MD;  Location: [facility] ENDOSCOPY;  Service: Cardiovascular;  Laterality: N/A;  . COLONOSCOPY  2002  . FASCIECTOMY Left 03/28/2019   Procedure: SEGMENTAL FASCIECTOMY LEFT RING FINGER;  Surgeon: Daryll Brod, MD;  Location: [place];  Service: Orthopedics;  Laterality: Left;  ANESTHESIA  AXILLARY BLOCK  .  INSERTION PROSTATE RADIATION SEED  8 4 2010   per Dr Rosana Hoes  . MELANOMA EXCISION Right 06/22/2019   Procedure: WIDE LOCAL EXCISION RIGHT CHEST WALL MELANOMA, ADVANCEMENT FLAP CLOSURE FOR DEFECT 3X6 CM;  Surgeon: Stark Klein, MD;  Location: [place];  Service: General;  Laterality: Right;  . POLYPECTOMY  2002  . THROAT SURGERY  1980   benign cyst  . URETHRAL STRICTURE DILATATION     also penile implant     Medications: Current Meds  Medication Sig  . diphenhydramine-acetaminophen (TYLENOL PM) 25-500 MG TABS tablet Take 1 tablet by mouth at bedtime as needed (sleep).  .  hydrochlorothiazide (HYDRODIURIL) 25 MG tablet TAKE 1 TABLET BY MOUTH EVERY DAY  . multivitamin (THERAGRAN) per tablet Take 1 tablet by mouth daily.    . Omega-3 Fatty Acids (FISH OIL) 1200 MG CAPS Take 1,200 mg by mouth daily.    Marland Kitchen omeprazole (PRILOSEC) 20 MG capsule Take 20 mg by mouth as needed (heartburn).  Marland Kitchen PRADAXA 150 MG CAPS capsule TAKE 1 CAPSULE BY MOUTH TWICE A DAY  . pravastatin (PRAVACHOL) 40 MG tablet Take 40 mg by mouth at bedtime.  . Tamsulosin HCl (FLOMAX) 0.4 MG CAPS Take 0.4 mg by mouth daily.    . valsartan (DIOVAN) 320 MG tablet TAKE 1 TABLET BY MOUTH EVERY DAY  . Vitamin D3 (VITAMIN D) 25 MCG tablet Take 1,000 Units by mouth daily.  . [DISCONTINUED] metoprolol tartrate (LOPRESSOR) 25 MG tablet Take 1 tablet (25 mg total) by mouth 2 (two) times daily.  . metoprolol tartrate (LOPRESSOR) 25 MG tablet Take 0.5 tablets (12.5 mg total) by mouth 2 (two) times daily.     Allergies: No Known Allergies  Social History: The patient  reports that he has never smoked. He has never used smokeless tobacco. He reports current alcohol use. He reports that he does not use drugs.   Family History: The patient's family history includes Arrhythmia in his brother; Cancer in his mother; Colon cancer (age of onset: 69) in his mother; Coronary artery disease in his brother; Diabetes in his brother and brother; Hearing loss in his brother; Heart disease in his brother and father; Heart failure in his brother; Hyperlipidemia in his sister; Hypertension in his brother, brother, brother, and father; Stroke in his mother and sister; Sudden death in his father; Suicidality in his sister.   Review of Systems: Please see the history of present illness.   All other systems are reviewed and negative.   Physical Exam: VS:  BP 96/60 (BP Location: Left Arm, Patient Position: Sitting, Cuff Size: Normal)   Pulse 68   Ht 5\' 9"  (1.753 m)   Wt 191 lb 6.4 oz (86.8 kg)   SpO2 96%   BMI 28.26 kg/m  .  BMI  Body mass index is 28.26 kg/m.  Wt Readings from Last 3 Encounters:  06/04/20 191 lb 6.4 oz (86.8 kg)  05/15/20 192 lb 9.6 oz (87.4 kg)  05/10/20 194 lb (88 kg)    General: Pleasant. Alert and in no acute distress. His color looks pale to me.    Cardiac: Regular rate and rhythm. No murmurs, rubs, or gallops. No edema.  Respiratory:  Lungs are clear to auscultation bilaterally with normal work of breathing.  GI: Soft and nontender.  MS: No deformity or atrophy. Gait and ROM intact.  Skin: Warm and dry. Color is sallow to me. Neuro:  Strength and sensation are intact and no gross focal deficits noted.  Psych: Alert, appropriate and with normal affect.   LABORATORY DATA:  EKG:  EKG is not ordered today.    Lab Results  Component Value Date   WBC 6.9 05/06/2020   HGB 10.2 (L) 05/06/2020   HCT 31.8 (L) 05/06/2020   PLT 168 05/06/2020   GLUCOSE 114 (H) 05/06/2020   CHOL 167 08/14/2019   TRIG 184 (H) 08/14/2019   HDL 45 08/14/2019   LDLCALC 94 08/14/2019   ALT 11 08/14/2019   AST 15 08/14/2019   NA 139 05/06/2020   K 4.1 05/06/2020   CL 106 05/06/2020   CREATININE 1.40 (H) 05/06/2020   BUN 19 05/06/2020   CO2 22 05/06/2020   INR 1.0 03/06/2009   HGBA1C 7.0 (H) 08/14/2019     BNP (last 3 results) No results for input(s): BNP in the last 8760 hours.  ProBNP (last 3 results) No results for input(s): PROBNP in the last 8760 hours.   Other Studies Reviewed Today:  ECHO IMPRESSIONS 04/2020  1. Inferior basal hypokinesis. Left ventricular ejection fraction, by  estimation, is 50 to 55%. The left ventricle has low normal function. The  left ventricle demonstrates regional wall motion abnormalities (see  scoring diagram/findings for  description). Left ventricular diastolic parameters are indeterminate.  2. Right ventricular systolic function is normal. The right ventricular  size is normal. There is normal pulmonary artery systolic pressure.  3. Left atrial size  was mildly dilated.  4. The mitral valve is normal in structure. Trivial mitral valve  regurgitation. No evidence of mitral stenosis.  5. The aortic valve is tricuspid. Aortic valve regurgitation is trivial.  Mild aortic valve sclerosis is present, with no evidence of aortic valve  stenosis.  6. Aortic dilatation noted. There is mild dilatation of the ascending  aorta measuring 39 mm.  7. The inferior vena cava is normal in size with greater than 50%  respiratory variability, suggesting right atrial pressure of 3 mmHg.    Assessment and Plan:  1. PAF - s/p recent ablation 04/17/20 and cardioversion 05/10/20 - remains in sinus - not feeling well - BP low - blood count slowly drifting down - will cut Metoprolol in half, recheck his labs today. He is to monitor his BP for me. Further disposition to follow.   2. Chronic anticoagulation - CHADSVASC of 4 - noted drop in his blood counts - rechecking labs today - further disposition to follow.   3. HTN - BP pretty soft - cutting Metoprolol back today - may need further reduction in his ARB or diuretic. Will see what his lab shows.   4. HLD - on statin -  Would continue.   5. OSA - not able to tolerate CPAP  Current medicines are reviewed with the patient today.  The patient does not have concerns regarding medicines other than what has been noted above.  The following changes have been made:  See above.  Labs/ tests ordered today include:    Orders Placed This Encounter  Procedures  . Basic metabolic panel  . CBC     Disposition:   FU with me in about 3 weeks. Labs today. Cutting metoprolol back.    Patient is agreeable to this plan and will call if any problems develop in the interim.   SignedTruitt Merle, NP  06/04/2020 10:46 AM  [facility] [address] [place], [place]  [address] Phone: [phone] Fax: [phone]

## 2020-05-28 DIAGNOSIS — Z23 Encounter for immunization: Secondary | ICD-10-CM | POA: Diagnosis not present

## 2020-06-01 ENCOUNTER — Other Ambulatory Visit: Payer: Self-pay | Admitting: Family Medicine

## 2020-06-01 DIAGNOSIS — D649 Anemia, unspecified: Secondary | ICD-10-CM | POA: Diagnosis not present

## 2020-06-04 ENCOUNTER — Encounter: Payer: Self-pay | Admitting: Nurse Practitioner

## 2020-06-04 ENCOUNTER — Ambulatory Visit (INDEPENDENT_AMBULATORY_CARE_PROVIDER_SITE_OTHER): Payer: Medicare Other | Admitting: Nurse Practitioner

## 2020-06-04 ENCOUNTER — Other Ambulatory Visit: Payer: Self-pay

## 2020-06-04 VITALS — BP 96/60 | HR 68 | Ht 69.0 in | Wt 191.4 lb

## 2020-06-04 DIAGNOSIS — I4819 Other persistent atrial fibrillation: Secondary | ICD-10-CM | POA: Diagnosis not present

## 2020-06-04 DIAGNOSIS — Z7901 Long term (current) use of anticoagulants: Secondary | ICD-10-CM

## 2020-06-04 DIAGNOSIS — Z79899 Other long term (current) drug therapy: Secondary | ICD-10-CM

## 2020-06-04 LAB — BASIC METABOLIC PANEL
BUN/Creatinine Ratio: 19 (ref 10–24)
BUN: 26 mg/dL (ref 8–27)
CO2: 22 mmol/L (ref 20–29)
Calcium: 9.7 mg/dL (ref 8.6–10.2)
Chloride: 103 mmol/L (ref 96–106)
Creatinine, Ser: 1.37 mg/dL — ABNORMAL HIGH (ref 0.76–1.27)
GFR calc Af Amer: 55 mL/min/{1.73_m2} — ABNORMAL LOW (ref >59)
GFR calc non Af Amer: 48 mL/min/{1.73_m2} — ABNORMAL LOW (ref >59)
Glucose: 218 mg/dL — ABNORMAL HIGH (ref 65–99)
Potassium: 4.6 mmol/L (ref 3.5–5.2)
Sodium: 140 mmol/L (ref 134–144)

## 2020-06-04 LAB — FECAL GLOBIN BY IMMUNOCHEMISTRY
FECAL GLOBIN RESULT:: NOT DETECTED
MICRO NUMBER:: 11113374
SPECIMEN QUALITY:: ADEQUATE

## 2020-06-04 LAB — CBC
Hematocrit: 36.6 % — ABNORMAL LOW (ref 37.5–51.0)
Hemoglobin: 12.3 g/dL — ABNORMAL LOW (ref 13.0–17.7)
MCH: 29.4 pg (ref 26.6–33.0)
MCHC: 33.6 g/dL (ref 31.5–35.7)
MCV: 87 fL (ref 79–97)
Platelets: 176 10*3/uL (ref 150–450)
RBC: 4.19 x10E6/uL (ref 4.14–5.80)
RDW: 13.3 % (ref 11.6–15.4)
WBC: 7.1 10*3/uL (ref 3.4–10.8)

## 2020-06-04 LAB — HOUSE ACCOUNT TRACKING

## 2020-06-04 MED ORDER — METOPROLOL TARTRATE 25 MG PO TABS: 12.5000 mg | ORAL_TABLET | Freq: Two times a day (BID) | ORAL | 11 refills | Status: DC

## 2020-06-04 NOTE — Patient Instructions (Addendum)
After Visit Summary:  We will be checking the following labs today - BMET & CBC   Medication Instructions:    Continue with your current medicines. BUT  I am cutting the Metoprolol in half - take just 1/2 tablet twice a day   If you need a refill on your cardiac medications before your next appointment, please call your pharmacy.     Testing/Procedures To Be Arranged:  N/A  Follow-Up:   See me in about 3 weeks    At [facility], you and your health needs are our priority.  As part of our continuing mission to provide you with exceptional heart care, we have created designated Provider Care Teams.  These Care Teams include your primary Cardiologist (physician) and Advanced Practice Providers (APPs -  Physician Assistants and Nurse Practitioners) who all work together to provide you with the care you need, when you need it.  Special Instructions:  . Stay safe, wash your hands for at least 20 seconds and wear a mask when needed.  . It was good to talk with you today.  Marland Kitchen Keep a check on your BP for me.  . Do the stool cards for Dr. Dennard Schaumann   Call the [place] office at [phone] if you have any questions, problems or concerns.

## 2020-06-05 ENCOUNTER — Telehealth: Payer: Self-pay | Admitting: *Deleted

## 2020-06-05 NOTE — Telephone Encounter (Addendum)
Left message to call back  

## 2020-06-05 NOTE — Telephone Encounter (Signed)
-----   Message from Burtis Junes, NP sent at 06/04/2020  6:08 PM EDT ----- Ok to report. Please let him know that his blood count has improved - this is good.  Hopefully cutting the Metoprolol back will make him feel better.  See Dr. Dennard Schaumann later this week as planned.

## 2020-06-07 ENCOUNTER — Ambulatory Visit (INDEPENDENT_AMBULATORY_CARE_PROVIDER_SITE_OTHER): Payer: Medicare Other | Admitting: Family Medicine

## 2020-06-07 ENCOUNTER — Other Ambulatory Visit: Payer: Self-pay

## 2020-06-07 VITALS — BP 120/80 | HR 90 | Temp 97.9°F | Ht 69.0 in | Wt 191.0 lb

## 2020-06-07 DIAGNOSIS — Z0001 Encounter for general adult medical examination with abnormal findings: Secondary | ICD-10-CM | POA: Diagnosis not present

## 2020-06-07 DIAGNOSIS — Z85820 Personal history of malignant melanoma of skin: Secondary | ICD-10-CM

## 2020-06-07 DIAGNOSIS — D649 Anemia, unspecified: Secondary | ICD-10-CM

## 2020-06-07 DIAGNOSIS — I4891 Unspecified atrial fibrillation: Secondary | ICD-10-CM | POA: Diagnosis not present

## 2020-06-07 DIAGNOSIS — E7800 Pure hypercholesterolemia, unspecified: Secondary | ICD-10-CM

## 2020-06-07 DIAGNOSIS — I10 Essential (primary) hypertension: Secondary | ICD-10-CM | POA: Diagnosis not present

## 2020-06-07 DIAGNOSIS — R7303 Prediabetes: Secondary | ICD-10-CM

## 2020-06-07 DIAGNOSIS — Z8546 Personal history of malignant neoplasm of prostate: Secondary | ICD-10-CM | POA: Diagnosis not present

## 2020-06-07 MED ORDER — FERROUS SULFATE 324 (65 FE) MG PO TBEC: 1.0000 | DELAYED_RELEASE_TABLET | Freq: Every day | ORAL | 2 refills | Status: DC

## 2020-06-07 NOTE — Progress Notes (Signed)
Subjective:    Patient ID: Antonio Clark, male    DOB: Dec 03, 1935, 84 y.o.   MRN: [id number]  HPI  Here for CPE.  Past medical history is significant for prostate cancer. Dr. Rosana Hoes implanted radioactive seeds. He is currently followed by urology.  Past medical history is also significant for prediabetes, hypertension, hyperlipidemia, remote melanoma as well as atrial fibrillation.  Recent lab work showed iron deficiency anemia.  Hemoglobin was 10.2.  It has improved most recently to 12.3 on iron replacement.    Patient went to his cardiologist and was found to have tachycardia due to atrial fibrillation.  They originally scheduled him for an ablation with Dr. Veva Holes.  He has undergone the ablation however most recently he was found to still be in atrial fibrillation and therefore he underwent cardioversion.  Today he is in normal sinus rhythm.  During the work-up for this, his hemoglobin was found to be as low as 10.2.  Earlier this year he was 76.  Stool test was negative for blood x1.  Ferritin was found to be low as was his B12 level.  He is been taking B12.  He has not been taking ferrous sulfate however his most recent hemoglobin improved to 12.3.  This is reassuring.  He denies any melena or hematochezia on his Pradaxa.  However he continues to report feeling extremely tired.  He attributes this some to the metoprolol that he is taking.  He denies any chest pain.  He denies any weight loss or fever or chills.  Due to his age she does not require colonoscopy assuming that there is no blood found in his stool.   Past Medical History:  Diagnosis Date  . Arthritis   . Atrial fibrillation ([place])   . Back pain   . Borderline diabetic   . Dysrhythmia    a-fib  . GERD (gastroesophageal reflux disease)   . History of echocardiogram 07/ 07/ 2011  . History of lithotripsy 1989  . Hyperlipidemia   . Hypertension   . Melanoma ([place]) 05/01/2019   right chest wall (12/20)  . Pre-diabetes   .  Prostate CA ([place]) 2010  . Sleep apnea    uses CPAP nightly  . Squamous cell carcinoma of skin 05/23/1992   bowens-left parietal scalp (CX35FU)  . Squamous cell carcinoma of skin 03/14/2008   in situ-left upper outer forehead-medial (CX35FU)  . Squamous cell carcinoma of skin 03/14/2008   in situ-crown of scalp (Cx35FU)  . Squamous cell carcinoma of skin 04/15/2011   in situ-right dorsal forearm (txpbx)  . Squamous cell carcinoma of skin 10/16/2011   in situ-left sideburn  . Squamous cell carcinoma of skin 03/11/2015   ka-left sideburn (CX35FU)  . Squamous cell carcinoma of skin 03/11/2015   ka-left forearm (CX35FU)  . Squamous cell carcinoma of skin 06/22/2018   in situ-left forearm, sup (txpbx)  . Squamous cell carcinoma of skin 05/01/2019   in situ-mid anterior scalp   . Squamous cell carcinoma of skin 05/01/2019   in situ-right upper arm   Past Surgical History:  Procedure Laterality Date  . ATRIAL FIBRILLATION ABLATION N/A 04/17/2020   Procedure: ATRIAL FIBRILLATION ABLATION;  Surgeon: Vickie Epley, MD;  Location: [facility];  Service: Cardiovascular;  Laterality: N/A;  . AXILLARY SENTINEL NODE BIOPSY Right 08/23/2019   Procedure: SENTINEL LYMPH NODE BIOSPY RIGHT AXILLA;  Surgeon: Stark Klein, MD;  Location: [facility];  Service: General;  Laterality: Right;  .  CARDIOVERSION N/A 05/10/2020   Procedure: CARDIOVERSION;  Surgeon: Freada Bergeron, MD;  Location: [facility] ENDOSCOPY;  Service: Cardiovascular;  Laterality: N/A;  . COLONOSCOPY  2002  . FASCIECTOMY Left 03/28/2019   Procedure: SEGMENTAL FASCIECTOMY LEFT RING FINGER;  Surgeon: Daryll Brod, MD;  Location: [facility];  Service: Orthopedics;  Laterality: Left;  ANESTHESIA  AXILLARY BLOCK  . INSERTION PROSTATE RADIATION SEED  8 4 2010   per Dr Rosana Hoes  . MELANOMA EXCISION Right 06/22/2019   Procedure: WIDE LOCAL EXCISION RIGHT CHEST WALL MELANOMA, ADVANCEMENT FLAP CLOSURE FOR DEFECT  3X6 CM;  Surgeon: Stark Klein, MD;  Location: [facility];  Service: General;  Laterality: Right;  . POLYPECTOMY  2002  . THROAT SURGERY  1980   benign cyst  . URETHRAL STRICTURE DILATATION     also penile implant   Current Outpatient Medications on File Prior to Visit  Medication Sig Dispense Refill  . diphenhydramine-acetaminophen (TYLENOL PM) 25-500 MG TABS tablet Take 1 tablet by mouth at bedtime as needed (sleep).    . hydrochlorothiazide (HYDRODIURIL) 25 MG tablet TAKE 1 TABLET BY MOUTH EVERY DAY 30 tablet 9  . metoprolol tartrate (LOPRESSOR) 25 MG tablet Take 0.5 tablets (12.5 mg total) by mouth 2 (two) times daily. 60 tablet 11  . multivitamin (THERAGRAN) per tablet Take 1 tablet by mouth daily.      . Omega-3 Fatty Acids (FISH OIL) 1200 MG CAPS Take 1,200 mg by mouth daily.      Marland Kitchen omeprazole (PRILOSEC) 20 MG capsule Take 20 mg by mouth as needed (heartburn).    Marland Kitchen PRADAXA 150 MG CAPS capsule TAKE 1 CAPSULE BY MOUTH TWICE A DAY 60 capsule 5  . pravastatin (PRAVACHOL) 40 MG tablet Take 40 mg by mouth at bedtime.    . Tamsulosin HCl (FLOMAX) 0.4 MG CAPS Take 0.4 mg by mouth daily.      . valsartan (DIOVAN) 320 MG tablet TAKE 1 TABLET BY MOUTH EVERY DAY 90 tablet 2  . Vitamin D3 (VITAMIN D) 25 MCG tablet Take 1,000 Units by mouth daily.     No current facility-administered medications on file prior to visit.   No Known Allergies Social History   Socioeconomic History  . Marital status: Single    Spouse name: Not on file  . Number of children: Not on file  . Years of education: Not on file  . Highest education level: Not on file  Occupational History  . Occupation: retired  Tobacco Use  . Smoking status: Never Smoker  . Smokeless tobacco: Never Used  Vaping Use  . Vaping Use: Never used  Substance and Sexual Activity  . Alcohol use: Yes    Comment: social  . Drug use: No  . Sexual activity: Yes  Other Topics Concern  . Not on file  Social History  Narrative  . Not on file   Social Determinants of Health   Financial Resource Strain: Low Risk   . Difficulty of Paying Living Expenses: Not very hard  Food Insecurity:   . Worried About Charity fundraiser in the Last Year: Not on file  . Ran Out of Food in the Last Year: Not on file  Transportation Needs:   . Lack of Transportation (Medical): Not on file  . Lack of Transportation (Non-Medical): Not on file  Physical Activity:   . Days of Exercise per Week: Not on file  . Minutes of Exercise per Session: Not on file  Stress:   .  Feeling of Stress : Not on file  Social Connections:   . Frequency of Communication with Friends and Family: Not on file  . Frequency of Social Gatherings with Friends and Family: Not on file  . Attends Religious Services: Not on file  . Active Member of Clubs or Organizations: Not on file  . Attends Archivist Meetings: Not on file  . Marital Status: Not on file  Intimate Partner Violence:   . Fear of Current or Ex-Partner: Not on file  . Emotionally Abused: Not on file  . Physically Abused: Not on file  . Sexually Abused: Not on file   Family History  Problem Relation Age of Onset  . Coronary artery disease Brother        3 brothers had CABG  . Arrhythmia Brother   . Diabetes Brother   . Hearing loss Brother   . Hypertension Brother   . Heart disease Brother   . Heart failure Brother   . Hypertension Brother   . Heart disease Father   . Hypertension Father   . Sudden death Father   . Stroke Mother        cerebral hemorrage  . Colon cancer Mother 8       small intestine  . Cancer Mother   . Suicidality Sister   . Stroke Sister   . Diabetes Brother   . Hypertension Brother   . Hyperlipidemia Sister   . Esophageal cancer Neg Hx   . Pancreatic cancer Neg Hx   . Prostate cancer Neg Hx   . Rectal cancer Neg Hx   . Stomach cancer Neg Hx      Review of Systems  All other systems reviewed and are negative.        Objective:   Physical Exam Vitals reviewed.  Constitutional:      General: He is not in acute distress.    Appearance: He is well-developed. He is not diaphoretic.  HENT:     Head: Normocephalic and atraumatic.     Right Ear: External ear normal.     Left Ear: External ear normal.     Nose: Nose normal.     Mouth/Throat:     Pharynx: No oropharyngeal exudate.  Eyes:     General: No scleral icterus.       Right eye: No discharge.        Left eye: No discharge.     Conjunctiva/sclera: Conjunctivae normal.     Pupils: Pupils are equal, round, and reactive to light.  Neck:     Thyroid: No thyromegaly.     Vascular: No JVD.     Trachea: No tracheal deviation.  Cardiovascular:     Rate and Rhythm: Normal rate and regular rhythm.     Heart sounds: Normal heart sounds. No murmur heard.  No friction rub. No gallop.   Pulmonary:     Effort: Pulmonary effort is normal. No respiratory distress.     Breath sounds: Normal breath sounds. No stridor. No wheezing or rales.  Chest:     Chest wall: No tenderness.  Abdominal:     General: Bowel sounds are normal. There is no distension.     Palpations: Abdomen is soft. There is no mass.     Tenderness: There is no abdominal tenderness. There is no guarding or rebound.  Musculoskeletal:        General: No tenderness. Normal range of motion.     Cervical back: Normal range of motion  and neck supple.  Lymphadenopathy:     Cervical: No cervical adenopathy.  Skin:    General: Skin is warm.     Coloration: Skin is not pale.     Findings: No erythema or rash.  Neurological:     Mental Status: He is alert and oriented to person, place, and time.     Cranial Nerves: No cranial nerve deficit.     Motor: No abnormal muscle tone.     Coordination: Coordination normal.     Deep Tendon Reflexes: Reflexes are normal and symmetric.  Psychiatric:        Behavior: Behavior normal.        Thought Content: Thought content normal.        Judgment:  Judgment normal.    Patient has scar tissue in the flexor tendon in the palm of his hand of his fourth digit on both hands and also the third digit on his right hand consistent with the contracture    Assessment & Plan:  Atrial fibrillation, unspecified type ([place]) - Plan: TSH  Primary hypertension - Plan: Hemoglobin A1c, COMPLETE METABOLIC PANEL WITH GFR, Lipid panel, Microalbumin, urine  Pure hypercholesterolemia - Plan: Hemoglobin A1c, COMPLETE METABOLIC PANEL WITH GFR, Lipid panel, Microalbumin, urine  Personal history of prostate cancer  Prediabetes - Plan: Hemoglobin A1c, COMPLETE METABOLIC PANEL WITH GFR, Lipid panel, Microalbumin, urine  History of melanoma  Anemia, unspecified type - Plan: Fecal Globin By Immunochemistry  Patient has had one stool sample negative for blood.  I would like to check 2 more to rule out an occult GI bleed.  Hemoglobin has improved on B12 however I want him to start ferrous sulfate 325 mg daily and then recheck a CBC in 1 month.  If blood is found in his stool, he will need a GI consultation.  His random blood sugar was over 200.  Therefore we will check an A1c.  Also check a CMP, fasting lipid panel, and urine microalbumin.  I will defer the management of his prostate cancer to his urologist he is monitoring his PSA for any evidence of recurrence.  Given his fatigue and recent atrial fibrillation, I will check a TSH.  My current expectation that is that his fatigue is multifactorial partly due to atrial fibrillation, partly due to metoprolol, partly due to his anemia, partly due to diabetes, and partly due to physical deconditioning.  However await the results of some the above studies.  He denies any falls, depression, or memory loss.

## 2020-06-08 LAB — COMPLETE METABOLIC PANEL WITH GFR
AG Ratio: 1.8 (calc) (ref 1.0–2.5)
ALT: 13 U/L (ref 9–46)
AST: 15 U/L (ref 10–35)
Albumin: 4.2 g/dL (ref 3.6–5.1)
Alkaline phosphatase (APISO): 52 U/L (ref 35–144)
BUN/Creatinine Ratio: 17 (calc) (ref 6–22)
BUN: 23 mg/dL (ref 7–25)
CO2: 22 mmol/L (ref 20–32)
Calcium: 9.7 mg/dL (ref 8.6–10.3)
Chloride: 105 mmol/L (ref 98–110)
Creat: 1.35 mg/dL — ABNORMAL HIGH (ref 0.70–1.11)
GFR, Est African American: 56 mL/min/{1.73_m2} — ABNORMAL LOW (ref >=60)
GFR, Est Non African American: 49 mL/min/{1.73_m2} — ABNORMAL LOW (ref >=60)
Globulin: 2.4 g/dL (calc) (ref 1.9–3.7)
Glucose, Bld: 180 mg/dL — ABNORMAL HIGH (ref 65–99)
Potassium: 4.2 mmol/L (ref 3.5–5.3)
Sodium: 140 mmol/L (ref 135–146)
Total Bilirubin: 0.5 mg/dL (ref 0.2–1.2)
Total Protein: 6.6 g/dL (ref 6.1–8.1)

## 2020-06-08 LAB — HEMOGLOBIN A1C
Hgb A1c MFr Bld: 7 % of total Hgb — ABNORMAL HIGH (ref <5.7)
Mean Plasma Glucose: 154 (calc)
eAG (mmol/L): 8.5 (calc)

## 2020-06-08 LAB — LIPID PANEL
Cholesterol: 146 mg/dL (ref <200)
HDL: 48 mg/dL (ref >=40)
LDL Cholesterol (Calc): 73 mg/dL (calc)
Non-HDL Cholesterol (Calc): 98 mg/dL (calc) (ref <130)
Total CHOL/HDL Ratio: 3 (calc) (ref <5.0)
Triglycerides: 167 mg/dL — ABNORMAL HIGH (ref <150)

## 2020-06-08 LAB — MICROALBUMIN, URINE: Microalb, Ur: 46.2 mg/dL

## 2020-06-08 LAB — TSH: TSH: 1.98 mIU/L (ref 0.40–4.50)

## 2020-06-11 ENCOUNTER — Telehealth: Payer: Self-pay

## 2020-06-11 NOTE — Telephone Encounter (Signed)
Pt called for lab results, verbalized lab results given

## 2020-06-12 NOTE — Telephone Encounter (Signed)
S/w pt is aware of Lori's results.

## 2020-06-12 NOTE — Progress Notes (Deleted)
CARDIOLOGY OFFICE NOTE  Date:  06/12/2020    Antonio Clark Date of Birth: [birth year] Medical Record #[id number]  PCP:  Susy Frizzle, MD  Cardiologist:  Servando Snare & ***    No chief complaint on file.   History of Present Illness: Antonio Clark is a 84 y.o. male who presents today for a ***  Seen for Dr. Marlou Porch and Dr. Quentin Ore - has also followed in the AF clinic as well.   He has a history of known PAF with AF ablation last month - with elevated HR's a few weeks afterwards with soft BP - found to have recurrent AF. He was cardioverted earlier this month - 3 shocks - beta blocker was added.   Other issues include HTN and HLD. Strong FH for CAD. Remote Cardiolite in 2013 was normal. He has been found to have OSA - but CPAP made him claustrophobic. Placed on Eliquis- changed to Pradaxa due to insurance coverage.  Comes in today. Here alone. He notes more fatigue. BP low. No energy. Blood count also dropping. 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 His rhythm has been good.    Comes in today. Here with   Past Medical History:  Diagnosis Date  . Arthritis   . Atrial fibrillation ([place])   . Back pain   . Borderline diabetic   . Dysrhythmia    a-fib  . GERD (gastroesophageal reflux disease)   . History of echocardiogram 07/ 07/ 2011  . History of lithotripsy 1989  . Hyperlipidemia   . Hypertension   . Melanoma ([place]) 05/01/2019   right chest wall (12/20)  . Pre-diabetes   . Prostate CA ([place]) 2010  . Sleep apnea    uses CPAP nightly  . Squamous cell carcinoma of skin 05/23/1992   bowens-left parietal scalp (CX35FU)  . Squamous cell carcinoma of skin 03/14/2008   in situ-left upper outer forehead-medial (CX35FU)  . Squamous cell carcinoma of skin 03/14/2008   in situ-crown of scalp (Cx35FU)  . Squamous cell carcinoma of skin 04/15/2011   in situ-right dorsal forearm (txpbx)  . Squamous cell carcinoma of skin 10/16/2011    in situ-left sideburn  . Squamous cell carcinoma of skin 03/11/2015   ka-left sideburn (CX35FU)  . Squamous cell carcinoma of skin 03/11/2015   ka-left forearm (CX35FU)  . Squamous cell carcinoma of skin 06/22/2018   in situ-left forearm, sup (txpbx)  . Squamous cell carcinoma of skin 05/01/2019   in situ-mid anterior scalp   . Squamous cell carcinoma of skin 05/01/2019   in situ-right upper arm    Past Surgical History:  Procedure Laterality Date  . ATRIAL FIBRILLATION ABLATION N/A 04/17/2020   Procedure: ATRIAL FIBRILLATION ABLATION;  Surgeon: Vickie Epley, MD;  Location: [facility];  Service: Cardiovascular;  Laterality: N/A;  . AXILLARY SENTINEL NODE BIOPSY Right 08/23/2019   Procedure: SENTINEL LYMPH NODE BIOSPY RIGHT AXILLA;  Surgeon: Stark Klein, MD;  Location: [facility];  Service: General;  Laterality: Right;  . CARDIOVERSION N/A 05/10/2020   Procedure: CARDIOVERSION;  Surgeon: Freada Bergeron, MD;  Location: [facility] ENDOSCOPY;  Service: Cardiovascular;  Laterality: N/A;  . COLONOSCOPY  2002  . FASCIECTOMY Left 03/28/2019   Procedure: SEGMENTAL FASCIECTOMY LEFT RING FINGER;  Surgeon: Daryll Brod, MD;  Location: [facility];  Service: Orthopedics;  Laterality: Left;  ANESTHESIA  AXILLARY BLOCK  . [place]  8 4 2010   per Dr Rosana Hoes  . MELANOMA EXCISION Right 06/22/2019   Procedure: WIDE LOCAL EXCISION RIGHT CHEST WALL MELANOMA, ADVANCEMENT FLAP CLOSURE FOR DEFECT 3X6 CM;  Surgeon: Stark Klein, MD;  Location: [facility];  Service: General;  Laterality: Right;  . POLYPECTOMY  2002  . THROAT SURGERY  1980   benign cyst  . URETHRAL STRICTURE DILATATION     also penile implant     Medications: No outpatient medications have been marked as taking for the 06/26/20 encounter (Appointment) with Burtis Junes, NP.     Allergies: No Known Allergies  Social History: The patient  reports that  he has never smoked. He has never used smokeless tobacco. He reports current alcohol use. He reports that he does not use drugs.   Family History: The patient's ***family history includes Arrhythmia in his brother; Cancer in his mother; Colon cancer (age of onset: 44) in his mother; Coronary artery disease in his brother; Diabetes in his brother and brother; Hearing loss in his brother; Heart disease in his brother and father; Heart failure in his brother; Hyperlipidemia in his sister; Hypertension in his brother, brother, brother, and father; Stroke in his mother and sister; Sudden death in his father; Suicidality in his sister.   Review of Systems: Please see the history of present illness.   All other systems are reviewed and negative.   Physical Exam: VS:  There were no vitals taken for this visit. Marland Kitchen  BMI There is no height or weight on file to calculate BMI.  Wt Readings from Last 3 Encounters:  06/07/20 191 lb (86.6 kg)  06/04/20 191 lb 6.4 oz (86.8 kg)  05/15/20 192 lb 9.6 oz (87.4 kg)    General: Pleasant. Well developed, well nourished and in no acute distress.   HEENT: Normal.  Neck: Supple, no JVD, carotid bruits, or masses noted.  Cardiac: ***Regular rate and rhythm. No murmurs, rubs, or gallops. No edema.  Respiratory:  Lungs are clear to auscultation bilaterally with normal work of breathing.  GI: Soft and nontender.  MS: No deformity or atrophy. Gait and ROM intact.  Skin: Warm and dry. Color is normal.  Neuro:  Strength and sensation are intact and no gross focal deficits noted.  Psych: Alert, appropriate and with normal affect.   LABORATORY DATA:  EKG:  EKG {ACTION; IS/IS [id number]} ordered today.  Personally reviewed by me. This demonstrates ***.  Lab Results  Component Value Date   WBC 7.1 06/04/2020   HGB 12.3 (L) 06/04/2020   HCT 36.6 (L) 06/04/2020   PLT 176 06/04/2020   GLUCOSE 180 (H) 06/07/2020   CHOL 146 06/07/2020   TRIG 167 (H) 06/07/2020   HDL  48 06/07/2020   LDLCALC 73 06/07/2020   ALT 13 06/07/2020   AST 15 06/07/2020   NA 140 06/07/2020   K 4.2 06/07/2020   CL 105 06/07/2020   CREATININE 1.35 (H) 06/07/2020   BUN 23 06/07/2020   CO2 22 06/07/2020   TSH 1.98 06/07/2020   INR 1.0 03/06/2009   HGBA1C 7.0 (H) 06/07/2020   MICROALBUR 46.2 06/07/2020     BNP (last 3 results) No results for input(s): BNP in the last 8760 hours.  ProBNP (last 3 results) No results for input(s): PROBNP in the last 8760 hours.   Other Studies Reviewed Today:   Assessment/Plan:  ECHO IMPRESSIONS 04/2020  1. Inferior basal hypokinesis. Left ventricular ejection fraction, by  estimation, is 50 to 55%.  The left ventricle has low normal function. The  left ventricle demonstrates regional wall motion abnormalities (see  scoring diagram/findings for  description). Left ventricular diastolic parameters are indeterminate.  2. Right ventricular systolic function is normal. The right ventricular  size is normal. There is normal pulmonary artery systolic pressure.  3. Left atrial size was mildly dilated.  4. The mitral valve is normal in structure. Trivial mitral valve  regurgitation. No evidence of mitral stenosis.  5. The aortic valve is tricuspid. Aortic valve regurgitation is trivial.  Mild aortic valve sclerosis is present, with no evidence of aortic valve  stenosis.  6. Aortic dilatation noted. There is mild dilatation of the ascending  aorta measuring 39 mm.  7. The inferior vena cava is normal in size with greater than 50%  respiratory variability, suggesting right atrial pressure of 3 mmHg.    Assessment and Plan:  1. PAF - s/p recent ablation 04/17/20 and cardioversion 05/10/20 - remains in sinus - not feeling well - BP low - blood count slowly drifting down - will cut Metoprolol in half, recheck his labs today. He is to monitor his BP for me. Further disposition to follow.   2. Chronic anticoagulation - CHADSVASC of 4 -  noted drop in his blood counts - rechecking labs today - further disposition to follow.   3. HTN - BP pretty soft - cutting Metoprolol back today - may need further reduction in his ARB or diuretic. Will see what his lab shows.   4. HLD - on statin -  Would continue.   5. OSA - not able to tolerate CPAP Current medicines are reviewed with the patient today.  The patient does not have concerns regarding medicines other than what has been noted above.  The following changes have been made:  See above.  Labs/ tests ordered today include:   No orders of the defined types were placed in this encounter.    Disposition:   FU with *** in {gen number [id number]:[id number]} {Days to years:10300}.   Patient is agreeable to this plan and will call if any problems develop in the interim.   SignedTruitt Merle, NP  06/12/2020 9:23 AM  [facility] [address] [place], [place]  [address] Phone: [phone] Fax: [phone]

## 2020-06-13 ENCOUNTER — Other Ambulatory Visit: Payer: Self-pay | Admitting: Cardiology

## 2020-06-16 DIAGNOSIS — D649 Anemia, unspecified: Secondary | ICD-10-CM | POA: Diagnosis not present

## 2020-06-17 ENCOUNTER — Telehealth: Payer: Self-pay | Admitting: Nurse Practitioner

## 2020-06-17 NOTE — Telephone Encounter (Signed)
     Pt said he will have conflict on 41/93, next OV with Truitt Merle is on 12/22. He would like to know if its ok to wait that long for his follow. He said if he unable to answer to leave him a detailed message on his VM

## 2020-06-17 NOTE — Telephone Encounter (Signed)
S/w pt moved appointment to the following week.

## 2020-06-18 NOTE — Progress Notes (Signed)
CARDIOLOGY OFFICE NOTE  Date:  07/02/2020    Antonio Clark Date of Birth: Sep 03, 1935 Medical Record #[id number]  PCP:  Susy Frizzle, MD  Cardiologist:  Servando [facility]   Chief Complaint  Patient presents with  . Follow-up    History of Present Illness: Antonio Clark is a 84 y.o. male who presents today for a follow up visit. This is a 3 week check. Seen for Dr. Marlou Porch and Dr. Quentin Ore - has also followed in the AF clinic as well.   He has a history of known PAF with AF ablation in September of 2021 - with elevated HR's a few weeks afterwards with soft BP - found to have recurrent AF. He was cardioverted in October - 3 shocks - beta blocker was added.   Other issues include HTN and HLD. Strong FH for CAD. Remote Cardiolite in 2013 was normal. He has been found to have OSA - but CPAP made him claustrophobic. Placed on Eliquis- changed to Pradaxa due to insurance coverage.  I saw him about 3 weeks ago - BP low - no energy - blood count dropping - concern for bleeding - rhythm had been good. I cut his Metoprolol back - his blood count was better on my check.   Comes in today. Here alone. He does not feel good - weak and tired. [facility] were +. He is going to be referred to GI - has not heard yet about that appointment. No chest pain. Breathing is ok. BP low here today. May have seen some improvement with the reduction of his beta blocker. He remains on Pradaxa. Also now on iron. His count did improve - we will recheck today. He does restrict his salt.   Past Medical History:  Diagnosis Date  . Arthritis   . Atrial fibrillation ([place])   . Back pain   . Borderline diabetic   . Dysrhythmia    a-fib  . GERD (gastroesophageal reflux disease)   . History of echocardiogram 07/ 07/ 2011  . History of lithotripsy 1989  . Hyperlipidemia   . Hypertension   . Melanoma ([place]) 05/01/2019   right chest wall (12/20)  .  Pre-diabetes   . Prostate CA ([place]) 2010  . Sleep apnea    uses CPAP nightly  . Squamous cell carcinoma of skin 05/23/1992   bowens-left parietal scalp (CX35FU)  . Squamous cell carcinoma of skin 03/14/2008   in situ-left upper outer forehead-medial (CX35FU)  . Squamous cell carcinoma of skin 03/14/2008   in situ-crown of scalp (Cx35FU)  . Squamous cell carcinoma of skin 04/15/2011   in situ-right dorsal forearm (txpbx)  . Squamous cell carcinoma of skin 10/16/2011   in situ-left sideburn  . Squamous cell carcinoma of skin 03/11/2015   ka-left sideburn (CX35FU)  . Squamous cell carcinoma of skin 03/11/2015   ka-left forearm (CX35FU)  . Squamous cell carcinoma of skin 06/22/2018   in situ-left forearm, sup (txpbx)  . Squamous cell carcinoma of skin 05/01/2019   in situ-mid anterior scalp   . Squamous cell carcinoma of skin 05/01/2019   in situ-right upper arm    Past Surgical History:  Procedure Laterality Date  . ATRIAL FIBRILLATION ABLATION N/A 04/17/2020   Procedure: ATRIAL FIBRILLATION ABLATION;  Surgeon: Vickie Epley, MD;  Location: [facility];  Service: Cardiovascular;  Laterality: N/A;  . AXILLARY SENTINEL NODE BIOPSY Right 08/23/2019  Procedure: SENTINEL LYMPH NODE BIOSPY RIGHT AXILLA;  Surgeon: Stark Klein, MD;  Location: [facility];  Service: General;  Laterality: Right;  . CARDIOVERSION N/A 05/10/2020   Procedure: CARDIOVERSION;  Surgeon: Freada Bergeron, MD;  Location: [facility];  Service: Cardiovascular;  Laterality: N/A;  . COLONOSCOPY  2002  . FASCIECTOMY Left 03/28/2019   Procedure: SEGMENTAL FASCIECTOMY LEFT RING FINGER;  Surgeon: Daryll Brod, MD;  Location: [facility];  Service: Orthopedics;  Laterality: Left;  ANESTHESIA  AXILLARY BLOCK  . INSERTION PROSTATE RADIATION SEED  8 4 2010   per Dr Rosana Hoes  . MELANOMA EXCISION Right 06/22/2019   Procedure: WIDE LOCAL EXCISION RIGHT CHEST WALL MELANOMA, ADVANCEMENT FLAP  CLOSURE FOR DEFECT 3X6 CM;  Surgeon: Stark Klein, MD;  Location: [facility];  Service: General;  Laterality: Right;  . POLYPECTOMY  2002  . THROAT SURGERY  1980   benign cyst  . URETHRAL STRICTURE DILATATION     also penile implant     Medications: Current Meds  Medication Sig  . diphenhydramine-acetaminophen (TYLENOL PM) 25-500 MG TABS tablet Take 1 tablet by mouth at bedtime as needed (sleep).  . ferrous sulfate 324 (65 Fe) MG TBEC Take 1 tablet (325 mg total) by mouth daily.  . metoprolol tartrate (LOPRESSOR) 25 MG tablet Take 0.5 tablets (12.5 mg total) by mouth 2 (two) times daily.  . multivitamin (THERAGRAN) per tablet Take 1 tablet by mouth daily.    . Omega-3 Fatty Acids (FISH OIL) 1200 MG CAPS Take 1,200 mg by mouth daily.    Marland Kitchen omeprazole (PRILOSEC) 20 MG capsule Take 20 mg by mouth as needed (heartburn).  Marland Kitchen PRADAXA 150 MG CAPS capsule TAKE 1 CAPSULE BY MOUTH TWICE A DAY  . pravastatin (PRAVACHOL) 40 MG tablet Take 40 mg by mouth at bedtime.   . Tamsulosin HCl (FLOMAX) 0.4 MG CAPS Take 0.4 mg by mouth daily.    . valsartan (DIOVAN) 320 MG tablet Take 0.5 tablets (160 mg total) by mouth daily.  . vitamin B-12 (CYANOCOBALAMIN) 1000 MCG tablet Take 1,000 mcg by mouth daily.  . Vitamin D3 (VITAMIN D) 25 MCG tablet Take 1,000 Units by mouth daily.  . [DISCONTINUED] hydrochlorothiazide (HYDRODIURIL) 25 MG tablet TAKE 1 TABLET BY MOUTH EVERY DAY  . [DISCONTINUED] valsartan (DIOVAN) 320 MG tablet TAKE 1 TABLET BY MOUTH EVERY DAY     Allergies: No Known Allergies  Social History: The patient  reports that he has never smoked. He has never used smokeless tobacco. He reports current alcohol use. He reports that he does not use drugs.   Family History: The patient's family history includes Arrhythmia in his brother; Cancer in his mother; Colon cancer (age of onset: 34) in his mother; Coronary artery disease in his brother; Diabetes in his brother and brother; Hearing  loss in his brother; Heart disease in his brother and father; Heart failure in his brother; Hyperlipidemia in his sister; Hypertension in his brother, brother, brother, and father; Stroke in his mother and sister; Sudden death in his father; Suicidality in his sister.   Review of Systems: Please see the history of present illness.   All other systems are reviewed and negative.   Physical Exam: VS:  BP 108/60   Pulse 73   Ht 5\' 9"  (1.753 m)   Wt 194 lb (88 kg)   SpO2 97%   BMI 28.65 kg/m  .  BMI Body mass index is 28.65 kg/m.  Wt Readings from Last  3 Encounters:  07/02/20 194 lb (88 kg)  06/07/20 191 lb (86.6 kg)  06/04/20 191 lb 6.4 oz (86.8 kg)    His BP is 80/40 by me today.   General: Alert and in no acute distress.   Cardiac: Regular rate and rhythm. No murmurs, rubs, or gallops. No edema.  Respiratory:  Lungs are clear to auscultation bilaterally with normal work of breathing.  GI: Soft and nontender.  MS: No deformity or atrophy. Gait and ROM intact.  Skin: Warm and dry. Color is normal. His color looks better today.  Neuro:  Strength and sensation are intact and no gross focal deficits noted.  Psych: Alert, appropriate and with normal affect.   LABORATORY DATA:  EKG:  EKG is not ordered today.    Lab Results  Component Value Date   WBC 7.1 06/04/2020   HGB 12.3 (L) 06/04/2020   HCT 36.6 (L) 06/04/2020   PLT 176 06/04/2020   GLUCOSE 180 (H) 06/07/2020   CHOL 146 06/07/2020   TRIG 167 (H) 06/07/2020   HDL 48 06/07/2020   LDLCALC 73 06/07/2020   ALT 13 06/07/2020   AST 15 06/07/2020   NA 140 06/07/2020   K 4.2 06/07/2020   CL 105 06/07/2020   CREATININE 1.35 (H) 06/07/2020   BUN 23 06/07/2020   CO2 22 06/07/2020   TSH 1.98 06/07/2020   INR 1.0 03/06/2009   HGBA1C 7.0 (H) 06/07/2020   MICROALBUR 46.2 06/07/2020     BNP (last 3 results) No results for input(s): BNP in the last 8760 hours.  ProBNP (last 3 results) No results for input(s): PROBNP in  the last 8760 hours.   Other Studies Reviewed Today:  ECHO IMPRESSIONS 04/2020  1. Inferior basal hypokinesis. Left ventricular ejection fraction, by  estimation, is 50 to 55%. The left ventricle has low normal function. The  left ventricle demonstrates regional wall motion abnormalities (see  scoring diagram/findings for  description). Left ventricular diastolic parameters are indeterminate.  2. Right ventricular systolic function is normal. The right ventricular  size is normal. There is normal pulmonary artery systolic pressure.  3. Left atrial size was mildly dilated.  4. The mitral valve is normal in structure. Trivial mitral valve  regurgitation. No evidence of mitral stenosis.  5. The aortic valve is tricuspid. Aortic valve regurgitation is trivial.  Mild aortic valve sclerosis is present, with no evidence of aortic valve  stenosis.  6. Aortic dilatation noted. There is mild dilatation of the ascending  aorta measuring 39 mm.  7. The inferior vena cava is normal in size with greater than 50%  respiratory variability, suggesting right atrial pressure of 3 mmHg.    Assessment and Plan:  1. Hypotension - cutting back his Valsartan to 160 mg a day. Stopping HCTZ today. Will continue to monitor. Lab today. He is to let me know if he fails to improve by early next week. May liberalize his salt today.   2. + stool for blood - to be seeing GI - he is going to touch base with PCP about referral. We will recheck his lab - he does remain on Pradaxa. He is now on iron as well.   3. PAF - prior ablation 04/2020 and prior cardioversion in early October - remains in sinus by exam.   4. HLD - on statin.   5. OSA - not able to tolerate CPAP.   Current medicines are reviewed with the patient today.  The patient does not have concerns  regarding medicines other than what has been noted above.  The following changes have been made:  See above.  Labs/ tests ordered today include:      Orders Placed This Encounter  Procedures  . Basic metabolic panel  . CBC     Disposition:   FU with Dr. Quentin Ore as planned in early December. I will see him in January and will then transition back over to Dr. Marlou Porch as he knows I am leaving in February.   Patient is agreeable to this plan and will call if any problems develop in the interim.   SignedTruitt Merle, NP  07/02/2020 2:44 PM  [facility] [address] [place], [place]  [address] Phone: [phone] Fax: [phone]

## 2020-06-19 ENCOUNTER — Other Ambulatory Visit: Payer: Medicare Other

## 2020-06-19 ENCOUNTER — Other Ambulatory Visit: Payer: Self-pay

## 2020-06-19 DIAGNOSIS — D649 Anemia, unspecified: Secondary | ICD-10-CM

## 2020-06-19 DIAGNOSIS — D593 Hemolytic-uremic syndrome, unspecified: Secondary | ICD-10-CM

## 2020-06-20 ENCOUNTER — Other Ambulatory Visit: Payer: Self-pay

## 2020-06-20 DIAGNOSIS — D649 Anemia, unspecified: Secondary | ICD-10-CM

## 2020-06-20 DIAGNOSIS — K921 Melena: Secondary | ICD-10-CM

## 2020-06-20 LAB — FECAL GLOBIN BY IMMUNOCHEMISTRY
FECAL GLOBIN RESULT:: DETECTED — AB
MICRO NUMBER:: 11185665
SPECIMEN QUALITY:: ADEQUATE

## 2020-06-21 LAB — FECAL GLOBIN BY IMMUNOCHEMISTRY
FECAL GLOBIN RESULT:: DETECTED — AB
MICRO NUMBER:: 11191316
SPECIMEN QUALITY:: ADEQUATE

## 2020-06-24 ENCOUNTER — Telehealth: Payer: Self-pay

## 2020-06-24 NOTE — Telephone Encounter (Signed)
Pt called for lab results, Pt verbalized understanding of lab results given.

## 2020-06-25 ENCOUNTER — Telehealth: Payer: Self-pay | Admitting: Pharmacist

## 2020-06-25 NOTE — Progress Notes (Signed)
Chronic Care Management Pharmacy Assistant   Name: Antonio Clark  MRN: [id number] DOB: Feb 22, 1936  Reason for Encounter: Disease State  Patient Questions:  1.  Have you seen any other providers since your last visit? Yes  2.  Any changes in your medicines or health? Yes    PCP : Susy Frizzle, MD   Their chronic conditions include: HTN, CAD, Afib, HLD, borderline DM.  Office Visits: 06-07-2020 (PCP) Patient presented in the office for CPE. Hx of significant prostate cancer. Dr. Rosana Hoes implanted radioactive seeds, and he currently be followed by urology. Recent lab work showed iron deficiency anemia. Notes from Dr. Dennard Schaumann after CMP w/GFR results were back state "There is blood in the stool sample. Therefore I recommend a GI consult as blood loss is likely contributing to his anemia from some GI source."  Consults: 06-13-2020 (Urology) Patient presented in the office c/o suprapubic pain with slight decrease in his urinary flow. There were no procedures done the day of the visit since sxs were improving. A plan to tentatively schedule cystoscopy with possible dilation if symptoms have not resolved. Urine culture was completed.  06-04-2020 [facility]) Patient presented in the office with NP Gerhardt for f/u Reported more fatigue. BP low. No energy. Blood count also dropping. Medication changes: Metoprolol Tartrate decreased from 25 mg to 12.5 mg twice daily. Repeat lab work ordered. Notes from BMP state "Ok to report. Please let him know that his blood count has improved - this is good. Hopefully cutting the Metoprolol back will make him feel better. See Dr. Dennard Schaumann later this week as planned."  05-15-2020 (Cardio) Patient presented in the office for persistent A-fib with elevated HR around 100 bpm for the past two weeks. Patient reported feeling fatigued with elevated HR. He had a successful cardioversion 05/10/20. He had to be shocked 3 times. Patient was placed on beta blocker for frequent  PAC's after cardioversion and is in SR at the time of the visit.  Allergies:  No Known Allergies  Medications: Outpatient Encounter Medications as of 06/25/2020  Medication Sig  . diphenhydramine-acetaminophen (TYLENOL PM) 25-500 MG TABS tablet Take 1 tablet by mouth at bedtime as needed (sleep).  . ferrous sulfate 324 (65 Fe) MG TBEC Take 1 tablet (325 mg total) by mouth daily.  . hydrochlorothiazide (HYDRODIURIL) 25 MG tablet TAKE 1 TABLET BY MOUTH EVERY DAY  . metoprolol tartrate (LOPRESSOR) 25 MG tablet Take 0.5 tablets (12.5 mg total) by mouth 2 (two) times daily.  . multivitamin (THERAGRAN) per tablet Take 1 tablet by mouth daily.    . Omega-3 Fatty Acids (FISH OIL) 1200 MG CAPS Take 1,200 mg by mouth daily.   (Patient not taking: Reported on 06/07/2020)  . omeprazole (PRILOSEC) 20 MG capsule Take 20 mg by mouth as needed (heartburn).  Marland Kitchen PRADAXA 150 MG CAPS capsule TAKE 1 CAPSULE BY MOUTH TWICE A DAY  . pravastatin (PRAVACHOL) 40 MG tablet Take 40 mg by mouth at bedtime. (Patient not taking: Reported on 06/07/2020)  . Tamsulosin HCl (FLOMAX) 0.4 MG CAPS Take 0.4 mg by mouth daily.    . valsartan (DIOVAN) 320 MG tablet TAKE 1 TABLET BY MOUTH EVERY DAY  . vitamin B-12 (CYANOCOBALAMIN) 1000 MCG tablet Take 1,000 mcg by mouth daily.  . Vitamin D3 (VITAMIN D) 25 MCG tablet Take 1,000 Units by mouth daily.   No facility-administered encounter medications on file as of 06/25/2020.    Current Diagnosis: Patient Active Problem List   Diagnosis Date  Noted  . Melanoma ([place])   . Atrial fibrillation ([place])   . Malignant neoplasm of prostate ([place]) 07/15/2015  . Recurrent nephrolithiasis 07/15/2015  . Personal history of prostate cancer 04/25/2015  . 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 Coronary artery disease 11/02/2011  . Urethral stricture 06/22/2011  . Hyperlipidemia   . Borderline diabetic   . Hypertension     Goals Addressed     None    Contacted the patient for General Adherence Call. Recent lab work done show blood in his stool and iron deficiency anemia. His lipids an A1C looked good. Inquired if the medication PRADAXA 150 MG CAPS capsule could be causing the blood in his stool. Has been tolerating the decreased Metoprolol. Reports a well controlled HR between 68-70 regularly. He has been checking his blood pressure and heart rate three to four times a day 121/76 was the last reading from yesterday. Stated he has an appointment with the urology this Friday. Patient has been having trouble urinating. He has a routine procedure done at the urologist that involves dilation to help with his stream. Patient also stated he would inquire with the cardiologist about reducing some of his medication. He feels overwhelmed with all that he has to take. Agreed with the patient to inquire with his cardiologist at next appointment.  Follow-Up:  Pharmacist Review   Fanny Skates, [place] Pharmacist Assistant [phone]

## 2020-06-26 ENCOUNTER — Ambulatory Visit: Payer: Medicare Other | Admitting: Nurse Practitioner

## 2020-07-02 ENCOUNTER — Encounter: Payer: Self-pay | Admitting: Nurse Practitioner

## 2020-07-02 ENCOUNTER — Ambulatory Visit (INDEPENDENT_AMBULATORY_CARE_PROVIDER_SITE_OTHER): Payer: Medicare Other | Admitting: Nurse Practitioner

## 2020-07-02 ENCOUNTER — Other Ambulatory Visit: Payer: Self-pay

## 2020-07-02 VITALS — BP 108/60 | HR 73 | Ht 69.0 in | Wt 194.0 lb

## 2020-07-02 DIAGNOSIS — Z79899 Other long term (current) drug therapy: Secondary | ICD-10-CM | POA: Diagnosis not present

## 2020-07-02 DIAGNOSIS — Z7901 Long term (current) use of anticoagulants: Secondary | ICD-10-CM | POA: Diagnosis not present

## 2020-07-02 DIAGNOSIS — I4819 Other persistent atrial fibrillation: Secondary | ICD-10-CM | POA: Diagnosis not present

## 2020-07-02 MED ORDER — VALSARTAN 320 MG PO TABS: 160.0000 mg | ORAL_TABLET | Freq: Every day | ORAL | 3 refills | Status: DC

## 2020-07-02 NOTE — Patient Instructions (Addendum)
After Visit Summary:  We will be checking the following labs today - BMEt & CBC   Medication Instructions:    Continue with your current medicines. BUT  I am cutting the Valsartan in half  - take just 1/2 (160 mg) a day  STOP the HCTZ for now   If you need a refill on your cardiac medications before your next appointment, please call your pharmacy.     Testing/Procedures To Be Arranged:  N/A  Follow-Up:   See Dr. Quentin Ore as planned in early December.   See me in January - then we will get you to Dr. Marlou Porch    At [facility], you and your health needs are our priority.  As part of our continuing mission to provide you with exceptional heart care, we have created designated Provider Care Teams.  These Care Teams include your primary Cardiologist (physician) and Advanced Practice Providers (APPs -  Physician Assistants and Nurse Practitioners) who all work together to provide you with the care you need, when you need it.  Special Instructions:  . Stay safe, wash your hands for at least 20 seconds and wear a mask when needed.  . It was good to talk with you today.  Madaline Brilliant to use some salt today.  . Call Dr. Dennard Schaumann about your GI appointment . Keep a check on your BP for me.    Call the [place] office at [phone] if you have any questions, problems or concerns.

## 2020-07-03 LAB — CBC
Hematocrit: 39 % (ref 37.5–51.0)
Hemoglobin: 12.7 g/dL — ABNORMAL LOW (ref 13.0–17.7)
MCH: 28.9 pg (ref 26.6–33.0)
MCHC: 32.6 g/dL (ref 31.5–35.7)
MCV: 89 fL (ref 79–97)
Platelets: 190 10*3/uL (ref 150–450)
RBC: 4.39 x10E6/uL (ref 4.14–5.80)
RDW: 13.3 % (ref 11.6–15.4)
WBC: 8 10*3/uL (ref 3.4–10.8)

## 2020-07-03 LAB — BASIC METABOLIC PANEL
BUN/Creatinine Ratio: 12 (ref 10–24)
BUN: 22 mg/dL (ref 8–27)
CO2: 23 mmol/L (ref 20–29)
Calcium: 9.5 mg/dL (ref 8.6–10.2)
Chloride: 103 mmol/L (ref 96–106)
Creatinine, Ser: 1.82 mg/dL — ABNORMAL HIGH (ref 0.76–1.27)
GFR calc Af Amer: 39 mL/min/{1.73_m2} — ABNORMAL LOW (ref >59)
GFR calc non Af Amer: 34 mL/min/{1.73_m2} — ABNORMAL LOW (ref >59)
Glucose: 162 mg/dL — ABNORMAL HIGH (ref 65–99)
Potassium: 4.9 mmol/L (ref 3.5–5.2)
Sodium: 142 mmol/L (ref 134–144)

## 2020-07-06 ENCOUNTER — Other Ambulatory Visit: Payer: Self-pay | Admitting: Cardiology

## 2020-07-08 ENCOUNTER — Ambulatory Visit (INDEPENDENT_AMBULATORY_CARE_PROVIDER_SITE_OTHER): Payer: Medicare Other | Admitting: Family Medicine

## 2020-07-08 ENCOUNTER — Other Ambulatory Visit: Payer: Self-pay

## 2020-07-08 VITALS — BP 130/80 | HR 72 | Temp 98.0°F | Ht 69.0 in | Wt 191.0 lb

## 2020-07-08 DIAGNOSIS — D649 Anemia, unspecified: Secondary | ICD-10-CM | POA: Diagnosis not present

## 2020-07-08 DIAGNOSIS — K921 Melena: Secondary | ICD-10-CM

## 2020-07-08 DIAGNOSIS — N289 Disorder of kidney and ureter, unspecified: Secondary | ICD-10-CM | POA: Diagnosis not present

## 2020-07-08 LAB — CBC WITH DIFFERENTIAL/PLATELET
Absolute Monocytes: 590 cells/uL (ref 200–950)
Basophils Absolute: 47 cells/uL (ref 0–200)
Basophils Relative: 0.7 %
Eosinophils Absolute: 261 cells/uL (ref 15–500)
Eosinophils Relative: 3.9 %
HCT: 36.8 % — ABNORMAL LOW (ref 38.5–50.0)
Hemoglobin: 12.4 g/dL — ABNORMAL LOW (ref 13.2–17.1)
Lymphs Abs: 1715 cells/uL (ref 850–3900)
MCH: 29.7 pg (ref 27.0–33.0)
MCHC: 33.7 g/dL (ref 32.0–36.0)
MCV: 88.2 fL (ref 80.0–100.0)
MPV: 9.8 fL (ref 7.5–12.5)
Monocytes Relative: 8.8 %
Neutro Abs: 4087 cells/uL (ref 1500–7800)
Neutrophils Relative %: 61 %
Platelets: 157 10*3/uL (ref 140–400)
RBC: 4.17 10*6/uL — ABNORMAL LOW (ref 4.20–5.80)
RDW: 13.1 % (ref 11.0–15.0)
Total Lymphocyte: 25.6 %
WBC: 6.7 10*3/uL (ref 3.8–10.8)

## 2020-07-08 LAB — BASIC METABOLIC PANEL WITH GFR
BUN/Creatinine Ratio: 17 (calc) (ref 6–22)
BUN: 21 mg/dL (ref 7–25)
CO2: 25 mmol/L (ref 20–32)
Calcium: 9.5 mg/dL (ref 8.6–10.3)
Chloride: 108 mmol/L (ref 98–110)
Creat: 1.24 mg/dL — ABNORMAL HIGH (ref 0.70–1.11)
GFR, Est African American: 62 mL/min/{1.73_m2} (ref >=60)
GFR, Est Non African American: 54 mL/min/{1.73_m2} — ABNORMAL LOW (ref >=60)
Glucose, Bld: 168 mg/dL — ABNORMAL HIGH (ref 65–99)
Potassium: 4.6 mmol/L (ref 3.5–5.3)
Sodium: 140 mmol/L (ref 135–146)

## 2020-07-08 NOTE — Telephone Encounter (Signed)
Pt last saw Truitt Merle, NP on 07/02/20, last labs 07/02/20 Creat 1.82, age 84, weight 86.6kg, CrCl 38.33, based on CrCl pt is on appropriate dosage of Pradaxa 150mg  BID.  Will refill rx.

## 2020-07-08 NOTE — Progress Notes (Signed)
Subjective:    Patient ID: Antonio Clark, male    DOB: 10/18/1935, 84 y.o.   MRN: [id number]  HPI  Patient has recently undergone cardioversion due to atrial fibrillation.  He is now in normal sinus rhythm however he is still taking Pradaxa.  Once he started Pradaxa he saw precipitous drop in his hemoglobin to 10.  He was found to be iron deficient.  He has been on an iron supplement and his hemoglobin has risen steadily to 12.3 and most recently to 12.7.  However 2 out of 3 stool cards have shown blood.  He does report occasional epigastric abdominal pain.  He had a colonoscopy 3 years ago that showed polyps but no malignancy.  He does have diarrhea frequently immediately upon eating.  He denies any fevers or chills.  However he continues to feel weak and tired.  Recently his renal function deteriorated as his creatinine went from 1.3-1.8.  However his cardiologist reduced his dose of valsartan and also discontinued his hydrochlorothiazide.  This raised his blood pressure and he feels much better now.  His strength is improving.  He is due to recheck his renal function.  They made these changes in his medication 1 week ago. Past Medical History:  Diagnosis Date  . Arthritis   . Atrial fibrillation ([place])   . Back pain   . Borderline diabetic   . Dysrhythmia    a-fib  . GERD (gastroesophageal reflux disease)   . History of echocardiogram 07/ 07/ 2011  . History of lithotripsy 1989  . Hyperlipidemia   . Hypertension   . Melanoma ([place]) 05/01/2019   right chest wall (12/20)  . Pre-diabetes   . Prostate CA ([place]) 2010  . Sleep apnea    uses CPAP nightly  . Squamous cell carcinoma of skin 05/23/1992   bowens-left parietal scalp (CX35FU)  . Squamous cell carcinoma of skin 03/14/2008   in situ-left upper outer forehead-medial (CX35FU)  . Squamous cell carcinoma of skin 03/14/2008   in situ-crown of scalp (Cx35FU)  . Squamous cell carcinoma of skin 04/15/2011   in situ-right dorsal forearm  (txpbx)  . Squamous cell carcinoma of skin 10/16/2011   in situ-left sideburn  . Squamous cell carcinoma of skin 03/11/2015   ka-left sideburn (CX35FU)  . Squamous cell carcinoma of skin 03/11/2015   ka-left forearm (CX35FU)  . Squamous cell carcinoma of skin 06/22/2018   in situ-left forearm, sup (txpbx)  . Squamous cell carcinoma of skin 05/01/2019   in situ-mid anterior scalp   . Squamous cell carcinoma of skin 05/01/2019   in situ-right upper arm   Past Surgical History:  Procedure Laterality Date  . ATRIAL FIBRILLATION ABLATION N/A 04/17/2020   Procedure: ATRIAL FIBRILLATION ABLATION;  Surgeon: Vickie Epley, MD;  Location: [place] [facility];  Service: Cardiovascular;  Laterality: N/A;  . AXILLARY SENTINEL NODE BIOPSY Right 08/23/2019   Procedure: SENTINEL LYMPH NODE BIOSPY RIGHT AXILLA;  Surgeon: Stark Klein, MD;  Location: [place];  Service: General;  Laterality: Right;  . CARDIOVERSION N/A 05/10/2020   Procedure: CARDIOVERSION;  Surgeon: Freada Bergeron, MD;  Location: [facility];  Service: Cardiovascular;  Laterality: N/A;  . COLONOSCOPY  2002  . FASCIECTOMY Left 03/28/2019   Procedure: SEGMENTAL FASCIECTOMY LEFT RING FINGER;  Surgeon: Daryll Brod, MD;  Location: [place];  Service: Orthopedics;  Laterality: Left;  ANESTHESIA  AXILLARY BLOCK  . INSERTION PROSTATE RADIATION SEED  8 4 2010  per Dr Rosana Hoes  . MELANOMA EXCISION Right 06/22/2019   Procedure: WIDE LOCAL EXCISION RIGHT CHEST WALL MELANOMA, ADVANCEMENT FLAP CLOSURE FOR DEFECT 3X6 CM;  Surgeon: Stark Klein, MD;  Location: [place];  Service: General;  Laterality: Right;  . POLYPECTOMY  2002  . THROAT SURGERY  1980   benign cyst  . URETHRAL STRICTURE DILATATION     also penile implant   Current Outpatient Medications on File Prior to Visit  Medication Sig Dispense Refill  . diphenhydramine-acetaminophen (TYLENOL PM) 25-500 MG TABS tablet Take 1 tablet  by mouth at bedtime as needed (sleep).    . ferrous sulfate 324 (65 Fe) MG TBEC Take 1 tablet (325 mg total) by mouth daily. 30 tablet 2  . metoprolol tartrate (LOPRESSOR) 25 MG tablet Take 0.5 tablets (12.5 mg total) by mouth 2 (two) times daily. 60 tablet 11  . multivitamin (THERAGRAN) per tablet Take 1 tablet by mouth daily.      . Omega-3 Fatty Acids (FISH OIL) 1200 MG CAPS Take 1,200 mg by mouth daily.      Marland Kitchen omeprazole (PRILOSEC) 20 MG capsule Take 20 mg by mouth as needed (heartburn).    Marland Kitchen PRADAXA 150 MG CAPS capsule TAKE 1 CAPSULE BY MOUTH TWICE A DAY 60 capsule 5  . pravastatin (PRAVACHOL) 40 MG tablet Take 40 mg by mouth at bedtime.     . Tamsulosin HCl (FLOMAX) 0.4 MG CAPS Take 0.4 mg by mouth daily.      . valsartan (DIOVAN) 320 MG tablet Take 0.5 tablets (160 mg total) by mouth daily. 90 tablet 3  . vitamin B-12 (CYANOCOBALAMIN) 1000 MCG tablet Take 1,000 mcg by mouth daily.    . Vitamin D3 (VITAMIN D) 25 MCG tablet Take 1,000 Units by mouth daily.     No current facility-administered medications on file prior to visit.   No Known Allergies Social History   Socioeconomic History  . Marital status: Single    Spouse name: Not on file  . Number of children: Not on file  . Years of education: Not on file  . Highest education level: Not on file  Occupational History  . Occupation: retired  Tobacco Use  . Smoking status: Never Smoker  . Smokeless tobacco: Never Used  Vaping Use  . Vaping Use: Never used  Substance and Sexual Activity  . Alcohol use: Yes    Comment: social  . Drug use: No  . Sexual activity: Yes  Other Topics Concern  . Not on file  Social History Narrative  . Not on file   Social Determinants of Health   Financial Resource Strain: Low Risk   . Difficulty of Paying Living Expenses: Not very hard  Food Insecurity:   . Worried About Charity fundraiser in the Last Year: Not on file  . Ran Out of Food in the Last Year: Not on file  Transportation  Needs:   . Lack of Transportation (Medical): Not on file  . Lack of Transportation (Non-Medical): Not on file  Physical Activity:   . Days of Exercise per Week: Not on file  . Minutes of Exercise per Session: Not on file  Stress:   . Feeling of Stress : Not on file  Social Connections:   . Frequency of Communication with Friends and Family: Not on file  . Frequency of Social Gatherings with Friends and Family: Not on file  . Attends Religious Services: Not on file  . Active Member of Clubs  or Organizations: Not on file  . Attends Archivist Meetings: Not on file  . Marital Status: Not on file  Intimate Partner Violence:   . Fear of Current or Ex-Partner: Not on file  . Emotionally Abused: Not on file  . Physically Abused: Not on file  . Sexually Abused: Not on file   Family History  Problem Relation Age of Onset  . Coronary artery disease Brother        3 brothers had CABG  . Arrhythmia Brother   . Diabetes Brother   . Hearing loss Brother   . Hypertension Brother   . Heart disease Brother   . Heart failure Brother   . Hypertension Brother   . Heart disease Father   . Hypertension Father   . Sudden death Father   . Stroke Mother        cerebral hemorrage  . Colon cancer Mother 33       small intestine  . Cancer Mother   . Suicidality Sister   . Stroke Sister   . Diabetes Brother   . Hypertension Brother   . Hyperlipidemia Sister   . Esophageal cancer Neg Hx   . Pancreatic cancer Neg Hx   . Prostate cancer Neg Hx   . Rectal cancer Neg Hx   . Stomach cancer Neg Hx      Review of Systems  All other systems reviewed and are negative.      Objective:   Physical Exam Vitals reviewed.  Constitutional:      General: He is not in acute distress.    Appearance: He is well-developed. He is not diaphoretic.  HENT:     Head: Normocephalic and atraumatic.     Right Ear: External ear normal.     Left Ear: External ear normal.     Nose: Nose normal.      Mouth/Throat:     Pharynx: No oropharyngeal exudate.  Eyes:     General: No scleral icterus.       Right eye: No discharge.        Left eye: No discharge.     Conjunctiva/sclera: Conjunctivae normal.     Pupils: Pupils are equal, round, and reactive to light.  Neck:     Thyroid: No thyromegaly.     Vascular: No JVD.     Trachea: No tracheal deviation.  Cardiovascular:     Rate and Rhythm: Normal rate and regular rhythm.     Heart sounds: Normal heart sounds. No murmur heard.  No friction rub. No gallop.   Pulmonary:     Effort: Pulmonary effort is normal. No respiratory distress.     Breath sounds: Normal breath sounds. No stridor. No wheezing or rales.  Chest:     Chest wall: No tenderness.  Abdominal:     General: Bowel sounds are normal. There is no distension.     Palpations: Abdomen is soft. There is no mass.     Tenderness: There is no abdominal tenderness. There is no guarding or rebound.  Musculoskeletal:        General: No tenderness. Normal range of motion.     Cervical back: Normal range of motion and neck supple.  Lymphadenopathy:     Cervical: No cervical adenopathy.  Skin:    General: Skin is warm.     Coloration: Skin is not pale.     Findings: No erythema or rash.  Neurological:     Mental Status: He is alert  and oriented to person, place, and time.     Cranial Nerves: No cranial nerve deficit.     Motor: No abnormal muscle tone.     Coordination: Coordination normal.     Deep Tendon Reflexes: Reflexes are normal and symmetric.  Psychiatric:        Behavior: Behavior normal.        Thought Content: Thought content normal.        Judgment: Judgment normal.        Assessment & Plan:  Anemia, unspecified type - Plan: CBC with Differential/Platelet, BASIC METABOLIC PANEL WITH GFR  Blood in stool  Renal insufficiency  Given his epigastric abdominal pain, I suspect the likely has some mild gastritis aggravated by Pradaxa.  Therefore I recommended  that he start taking omeprazole every day.  I will consult GI for an EGD and a colonoscopy depending upon the recommendation particular given his fatigue.  His hemoglobin seems stable.  We will need to monitor closely given the fact that he has had blood in his stool and his hemoglobin has only recently begun to rise.  Repeat a BMP to see if his renal function has improved since decreasing valsartan and discontinuing hydrochlorothiazide

## 2020-07-17 ENCOUNTER — Encounter: Payer: Self-pay | Admitting: Cardiology

## 2020-07-17 ENCOUNTER — Ambulatory Visit (INDEPENDENT_AMBULATORY_CARE_PROVIDER_SITE_OTHER): Payer: Medicare Other | Admitting: Cardiology

## 2020-07-17 ENCOUNTER — Other Ambulatory Visit: Payer: Self-pay

## 2020-07-17 VITALS — BP 142/70 | HR 68 | Ht 69.0 in | Wt 197.2 lb

## 2020-07-17 DIAGNOSIS — I10 Essential (primary) hypertension: Secondary | ICD-10-CM

## 2020-07-17 DIAGNOSIS — I4819 Other persistent atrial fibrillation: Secondary | ICD-10-CM

## 2020-07-17 NOTE — Progress Notes (Signed)
Electrophysiology Office Follow up Visit Note:    Date:  07/17/2020   ID:  Antonio Clark, DOB 07/12/1936, MRN [id number]  PCP:  Susy Frizzle, MD  [facility] Cardiologist:  Candee Furbish, MD  [facility] Electrophysiologist:  Vickie Epley, MD    Interval History:    Antonio Clark is a 84 y.o. male who presents for a follow up visit after his atrial fibrillation ablation on April 17, 2020.  After his ablation, he did have an early episode of atrial fibrillation that required cardioversion on May 10, 2020.  Since that time he has maintained normal rhythm.  He tells me that he feels significantly better since being in normal rhythm.  He continues to work with Truitt Merle, NP on his blood pressure control.  She has recently decreased his dose of valsartan to 160 mg daily.  Since that change, his blood pressures have improved and today he is in the 810F systolic.  He continues to take his Pradaxa for stroke prophylaxis without any bleeding issues.     Past Medical History:  Diagnosis Date  . Arthritis   . Atrial fibrillation ([place])   . Back pain   . Borderline diabetic   . Dysrhythmia    a-fib  . GERD (gastroesophageal reflux disease)   . History of echocardiogram 07/ 07/ 2011  . History of lithotripsy 1989  . Hyperlipidemia   . Hypertension   . Melanoma ([place]) 05/01/2019   right chest wall (12/20)  . Pre-diabetes   . Prostate CA ([place]) 2010  . Sleep apnea    uses CPAP nightly  . Squamous cell carcinoma of skin 05/23/1992   bowens-left parietal scalp (CX35FU)  . Squamous cell carcinoma of skin 03/14/2008   in situ-left upper outer forehead-medial (CX35FU)  . Squamous cell carcinoma of skin 03/14/2008   in situ-crown of scalp (Cx35FU)  . Squamous cell carcinoma of skin 04/15/2011   in situ-right dorsal forearm (txpbx)  . Squamous cell carcinoma of skin 10/16/2011   in situ-left sideburn  . Squamous cell carcinoma of skin 03/11/2015   ka-left sideburn  (CX35FU)  . Squamous cell carcinoma of skin 03/11/2015   ka-left forearm (CX35FU)  . Squamous cell carcinoma of skin 06/22/2018   in situ-left forearm, sup (txpbx)  . Squamous cell carcinoma of skin 05/01/2019   in situ-mid anterior scalp   . Squamous cell carcinoma of skin 05/01/2019   in situ-right upper arm    Past Surgical History:  Procedure Laterality Date  . ATRIAL FIBRILLATION ABLATION N/A 04/17/2020   Procedure: ATRIAL FIBRILLATION ABLATION;  Surgeon: Vickie Epley, MD;  Location: [place] [facility];  Service: Cardiovascular;  Laterality: N/A;  . AXILLARY SENTINEL NODE BIOPSY Right 08/23/2019   Procedure: SENTINEL LYMPH NODE BIOSPY RIGHT AXILLA;  Surgeon: Stark Klein, MD;  Location: [place];  Service: General;  Laterality: Right;  . CARDIOVERSION N/A 05/10/2020   Procedure: CARDIOVERSION;  Surgeon: Freada Bergeron, MD;  Location: [facility]OSCOPY;  Service: Cardiovascular;  Laterality: N/A;  . COLONOSCOPY  2002  . FASCIECTOMY Left 03/28/2019   Procedure: SEGMENTAL FASCIECTOMY LEFT RING FINGER;  Surgeon: Daryll Brod, MD;  Location: [place];  Service: Orthopedics;  Laterality: Left;  ANESTHESIA  AXILLARY BLOCK  . INSERTION PROSTATE RADIATION SEED  8 4 2010   per Dr Rosana Hoes  . MELANOMA EXCISION Right 06/22/2019   Procedure: WIDE LOCAL EXCISION RIGHT CHEST WALL MELANOMA, ADVANCEMENT FLAP CLOSURE FOR DEFECT 3X6 CM;  Surgeon: Stark Klein, MD;  Location: [place];  Service: General;  Laterality: Right;  . POLYPECTOMY  2002  . THROAT SURGERY  1980   benign cyst  . URETHRAL STRICTURE DILATATION     also penile implant    Current Medications: Current Meds  Medication Sig  . ferrous sulfate 324 (65 Fe) MG TBEC Take 1 tablet (325 mg total) by mouth daily.  . metoprolol tartrate (LOPRESSOR) 25 MG tablet Take 0.5 tablets (12.5 mg total) by mouth 2 (two) times daily.  . multivitamin (THERAGRAN) per tablet Take 1 tablet by mouth  daily.    . Omega-3 Fatty Acids (FISH OIL) 1200 MG CAPS Take 1,200 mg by mouth daily.    Marland Kitchen omeprazole (PRILOSEC) 20 MG capsule Take 20 mg by mouth as needed (heartburn).  Marland Kitchen PRADAXA 150 MG CAPS capsule TAKE 1 CAPSULE BY MOUTH TWICE A DAY  . pravastatin (PRAVACHOL) 40 MG tablet Take 40 mg by mouth at bedtime.   . Tamsulosin HCl (FLOMAX) 0.4 MG CAPS Take 0.4 mg by mouth daily.    . valsartan (DIOVAN) 320 MG tablet Take 0.5 tablets (160 mg total) by mouth daily.  . vitamin B-12 (CYANOCOBALAMIN) 1000 MCG tablet Take 1,000 mcg by mouth daily.  . Vitamin D3 (VITAMIN D) 25 MCG tablet Take 1,000 Units by mouth daily.     Allergies:   Patient has no known allergies.   Social History   Socioeconomic History  . Marital status: Single    Spouse name: Not on file  . Number of children: Not on file  . Years of education: Not on file  . Highest education level: Not on file  Occupational History  . Occupation: retired  Tobacco Use  . Smoking status: Never Smoker  . Smokeless tobacco: Never Used  Vaping Use  . Vaping Use: Never used  Substance and Sexual Activity  . Alcohol use: Yes    Comment: social  . Drug use: No  . Sexual activity: Yes  Other Topics Concern  . Not on file  Social History Narrative  . Not on file   Social Determinants of Health   Financial Resource Strain: Low Risk   . Difficulty of Paying Living Expenses: Not very hard  Food Insecurity:   . Worried About Charity fundraiser in the Last Year: Not on file  . Ran Out of Food in the Last Year: Not on file  Transportation Needs:   . Lack of Transportation (Medical): Not on file  . Lack of Transportation (Non-Medical): Not on file  Physical Activity:   . Days of Exercise per Week: Not on file  . Minutes of Exercise per Session: Not on file  Stress:   . Feeling of Stress : Not on file  Social Connections:   . Frequency of Communication with Friends and Family: Not on file  . Frequency of Social Gatherings with  Friends and Family: Not on file  . Attends Religious Services: Not on file  . Active Member of Clubs or Organizations: Not on file  . Attends Archivist Meetings: Not on file  . Marital Status: Not on file     Family History: The patient's family history includes Arrhythmia in his brother; Cancer in his mother; Colon cancer (age of onset: 49) in his mother; Coronary artery disease in his brother; Diabetes in his brother and brother; Hearing loss in his brother; Heart disease in his brother and father; Heart failure in his brother; Hyperlipidemia in  his sister; Hypertension in his brother, brother, brother, and father; Stroke in his mother and sister; Sudden death in his father; Suicidality in his sister. There is no history of Esophageal cancer, Pancreatic cancer, Prostate cancer, Rectal cancer, or Stomach cancer.  ROS:   Please see the history of present illness.    All other systems reviewed and are negative.  EKGs/Labs/Other Studies Reviewed:    The following studies were reviewed today: Prior notes  EKG:  The ekg ordered today demonstrates sinus rhythm with a single PVC.  Recent Labs: 06/07/2020: ALT 13; TSH 1.98 07/08/2020: BUN 21; Creat 1.24; Hemoglobin 12.4; Platelets 157; Potassium 4.6; Sodium 140  Recent Lipid Panel    Component Value Date/Time   CHOL 146 06/07/2020 0957   TRIG 167 (H) 06/07/2020 0957   HDL 48 06/07/2020 0957   CHOLHDL 3.0 06/07/2020 0957   VLDL 34 (H) 01/18/2017 0813   LDLCALC 73 06/07/2020 0957    Physical Exam:    VS:  BP (!) 142/70   Pulse 68   Ht 5\' 9"  (1.753 m)   Wt 197 lb 3.2 oz (89.4 kg)   SpO2 98%   BMI 29.12 kg/m     Wt Readings from Last 3 Encounters:  07/17/20 197 lb 3.2 oz (89.4 kg)  07/08/20 191 lb (86.6 kg)  07/02/20 194 lb (88 kg)     GEN:  Well nourished, well developed in no acute distress HEENT: Normal NECK: No JVD; No carotid bruits LYMPHATICS: No lymphadenopathy CARDIAC: RRR, no murmurs, rubs,  gallops RESPIRATORY:  Clear to auscultation without rales, wheezing or rhonchi  ABDOMEN: Soft, non-tender, non-distended MUSCULOSKELETAL:  No edema; No deformity  SKIN: Warm and dry NEUROLOGIC:  Alert and oriented x 3 PSYCHIATRIC:  Normal affect   ASSESSMENT:    1. Persistent atrial fibrillation ([place])   2. Essential hypertension    PLAN:    In order of problems listed above:  1.  Persistent atrial fibrillation Doing well after his A. fib ablation in early September.   Continue Pradaxa for stroke prophylaxis.  2.  Hypertension Under good control.  Agree with decision to decrease valsartan.  Continue close follow-up with Truitt Merle regarding blood pressure control.  Plan follow-up in 6 months.   Medication Adjustments/Labs and Tests Ordered: Current medicines are reviewed at length with the patient today.  Concerns regarding medicines are outlined above.  Orders Placed This Encounter  Procedures  . EKG 12-Lead   No orders of the defined types were placed in this encounter.    Signed, Lars Mage, MD, [facility]  07/17/2020 6:38 PM    Electrophysiology [place]

## 2020-07-17 NOTE — Patient Instructions (Addendum)
Medication Instructions:  Your physician recommends that you continue on your current medications as directed. Please refer to the Current Medication list given to you today.  Labwork: None ordered.  Testing/Procedures: None ordered.  Follow-Up: Your physician wants you to follow-up in: 6 months with Dr. Lambert.   You will receive a reminder letter in the mail two months in advance. If you don't receive a letter, please call our office to schedule the follow-up appointment.   Any Other Special Instructions Will Be Listed Below (If Applicable).  If you need a refill on your cardiac medications before your next appointment, please call your pharmacy.   

## 2020-08-19 ENCOUNTER — Encounter: Payer: Self-pay | Admitting: Gastroenterology

## 2020-08-19 ENCOUNTER — Ambulatory Visit: Payer: Medicare Other | Admitting: Gastroenterology

## 2020-08-19 ENCOUNTER — Telehealth: Payer: Self-pay | Admitting: *Deleted

## 2020-08-19 VITALS — BP 112/62 | HR 68 | Ht 67.5 in | Wt 191.1 lb

## 2020-08-19 DIAGNOSIS — D509 Iron deficiency anemia, unspecified: Secondary | ICD-10-CM | POA: Diagnosis not present

## 2020-08-19 MED ORDER — SUPREP BOWEL PREP KIT 17.5-3.13-1.6 GM/177ML PO SOLN: 1.0000 | Freq: Once | ORAL | 0 refills | Status: AC

## 2020-08-19 NOTE — Telephone Encounter (Signed)
Patient with diagnosis of afib on Pradaxa for anticoagulation.    Procedure: EGD / Colonoscopy Date of procedure: 10/11/20    CHA2DS2-VASc Score = 3  This indicates a 3.2% annual risk of stroke. The patient's score is based upon: CHF History: No HTN History: Yes Diabetes History: No Stroke History: No Vascular Disease History: No Age Score: 2 Gender Score: 0      CrCl 43 ml/min  Per office protocol, patient can hold Pradaxa for 4-5 days prior to procedure.

## 2020-08-19 NOTE — Progress Notes (Signed)
Antonio Clark    [id number]    02/10/1936  Primary Care Physician:Pickard, Cammie Mcgee, MD  Referring Physician: Susy Frizzle, MD [address] [place],  [place] [address]   Chief complaint:  Anemia  HPI:  85 year old very pleasant gentleman here for evaluation of anemia In September 2021 hemoglobin dropped to 10 from baseline of 12-14.  He was noted to be B12 deficient and iron deficient Ferritin 19 with iron saturation of 16%.  B12 level 283.  He has been taking supplements with improvement of anemia He is on chronic antiplatelet therapy  Colonoscopy May 03, 2017: 2 sessile polyps removed.  Diverticulosis and internal hemorrhoids  Outpatient Encounter Medications as of 08/19/2020  Medication Sig  . metoprolol tartrate (LOPRESSOR) 25 MG tablet Take 0.5 tablets (12.5 mg total) by mouth 2 (two) times daily.  . multivitamin (THERAGRAN) per tablet Take 1 tablet by mouth daily.  . [EXPIRED] Na Sulfate-K Sulfate-Mg Sulf (SUPREP BOWEL PREP KIT) 17.5-3.13-1.6 GM/177ML SOLN Take 1 kit by mouth once for 1 dose.  Marland Kitchen omeprazole (PRILOSEC) 20 MG capsule Take 20 mg by mouth as needed (heartburn).  Marland Kitchen PRADAXA 150 MG CAPS capsule TAKE 1 CAPSULE BY MOUTH TWICE A DAY  . pravastatin (PRAVACHOL) 40 MG tablet Take 40 mg by mouth at bedtime.   . Tamsulosin HCl (FLOMAX) 0.4 MG CAPS Take 0.4 mg by mouth daily.  . valsartan (DIOVAN) 320 MG tablet Take 0.5 tablets (160 mg total) by mouth daily.  . vitamin B-12 (CYANOCOBALAMIN) 1000 MCG tablet Take 1,000 mcg by mouth daily.  . Vitamin D3 (VITAMIN D) 25 MCG tablet Take 1,000 Units by mouth daily.  . [DISCONTINUED] ferrous sulfate 324 (65 Fe) MG TBEC Take 1 tablet (325 mg total) by mouth daily.  . [DISCONTINUED] Omega-3 Fatty Acids (FISH OIL) 1200 MG CAPS Take 1,200 mg by mouth daily.     No facility-administered encounter medications on file as of 08/19/2020.    Allergies as of 08/19/2020  . (No Known Allergies)    Past  Medical History:  Diagnosis Date  . Anal fissure   . Arthritis   . Atrial fibrillation ([place])   . Back pain   . Borderline diabetic   . Colon polyps   . Dysrhythmia    a-fib  . GERD (gastroesophageal reflux disease)   . History of echocardiogram 07/ 07/ 2011  . History of lithotripsy 1989  . Hyperlipidemia   . Hypertension   . Kidney stones   . Melanoma ([place]) 05/01/2019   right chest wall (12/20)  . Pre-diabetes   . Prostate CA ([place]) 2010  . Sleep apnea    uses CPAP nightly  . Squamous cell carcinoma of skin 05/23/1992   bowens-left parietal scalp (CX35FU)  . Squamous cell carcinoma of skin 03/14/2008   in situ-left upper outer forehead-medial (CX35FU)  . Squamous cell carcinoma of skin 03/14/2008   in situ-crown of scalp (Cx35FU)  . Squamous cell carcinoma of skin 04/15/2011   in situ-right dorsal forearm (txpbx)  . Squamous cell carcinoma of skin 10/16/2011   in situ-left sideburn  . Squamous cell carcinoma of skin 03/11/2015   ka-left sideburn (CX35FU)  . Squamous cell carcinoma of skin 03/11/2015   ka-left forearm (CX35FU)  . Squamous cell carcinoma of skin 06/22/2018   in situ-left forearm, sup (txpbx)  . Squamous cell carcinoma of skin 05/01/2019   in situ-mid anterior scalp   . Squamous cell  carcinoma of skin 05/01/2019   in situ-right upper arm    Past Surgical History:  Procedure Laterality Date  . ATRIAL FIBRILLATION ABLATION N/A 04/17/2020   Procedure: ATRIAL FIBRILLATION ABLATION;  Surgeon: Vickie Epley, MD;  Location: [facility];  Service: Cardiovascular;  Laterality: N/A;  . AXILLARY SENTINEL NODE BIOPSY Right 08/23/2019   Procedure: SENTINEL LYMPH NODE BIOSPY RIGHT AXILLA;  Surgeon: Stark Klein, MD;  Location: [facility];  Service: General;  Laterality: Right;  . CARDIOVERSION N/A 05/10/2020   Procedure: CARDIOVERSION;  Surgeon: Freada Bergeron, MD;  Location: [facility];  Service: Cardiovascular;  Laterality: N/A;  .  COLONOSCOPY  2002  . FASCIECTOMY Left 03/28/2019   Procedure: SEGMENTAL FASCIECTOMY LEFT RING FINGER;  Surgeon: Daryll Brod, MD;  Location: [facility];  Service: Orthopedics;  Laterality: Left;  ANESTHESIA  AXILLARY BLOCK  . INSERTION PROSTATE RADIATION SEED  8 4 2010   per Dr Rosana Hoes  . MELANOMA EXCISION Right 06/22/2019   Procedure: WIDE LOCAL EXCISION RIGHT CHEST WALL MELANOMA, ADVANCEMENT FLAP CLOSURE FOR DEFECT 3X6 CM;  Surgeon: Stark Klein, MD;  Location: [facility];  Service: General;  Laterality: Right;  . POLYPECTOMY  2002  . THROAT SURGERY  1980   benign cyst  . URETHRAL STRICTURE DILATATION     also penile implant    Family History  Problem Relation Age of Onset  . Coronary artery disease Brother        3 brothers had CABG  . Arrhythmia Brother   . Diabetes Brother   . Hearing loss Brother   . Hypertension Brother   . Heart disease Brother   . Heart failure Brother   . Hypertension Brother   . Heart disease Father   . Hypertension Father   . Sudden death Father   . Stroke Mother        cerebral hemorrage  . Colon cancer Mother 80       small intestine  . Suicidality Sister   . Stroke Sister   . Diabetes Brother   . Hypertension Brother   . Hyperlipidemia Sister   . Esophageal cancer Neg Hx   . Pancreatic cancer Neg Hx   . Prostate cancer Neg Hx   . Rectal cancer Neg Hx   . Stomach cancer Neg Hx     Social History   Socioeconomic History  . Marital status: Single    Spouse name: Not on file  . Number of children: 0  . Years of education: Not on file  . Highest education level: Not on file  Occupational History  . Occupation: retired  Tobacco Use  . Smoking status: Never Smoker  . Smokeless tobacco: Never Used  Vaping Use  . Vaping Use: Never used  Substance and Sexual Activity  . Alcohol use: Yes    Comment: social  . Drug use: No  . Sexual activity: Yes  Other Topics Concern  . Not on file  Social History  Narrative  . Not on file   Social Determinants of Health   Financial Resource Strain: Low Risk   . Difficulty of Paying Living Expenses: Not very hard  Food Insecurity: Not on file  Transportation Needs: Not on file  Physical Activity: Not on file  Stress: Not on file  Social Connections: Not on file  Intimate Partner Violence: Not on file      Review of systems: All other review of systems negative except as mentioned in  the HPI.   Physical Exam: Vitals:   08/19/20 1337  BP: 112/62  Pulse: 68   Body mass index is 29.49 kg/m. Gen:      No acute distress HEENT:  sclera anicteric Abd:      soft, non-tender; no palpable masses, no distension Ext:    No edema Neuro: alert and oriented x 3 Psych: normal mood and affect  Data Reviewed:  Reviewed labs, radiology imaging, old records and pertinent past GI work up   Assessment and Plan/Recommendations:  85 year old very pleasant gentleman here for evaluation of recent onset iron deficiency anemia  We will plan to proceed with EGD and colonoscopy for further evaluation Differential includes erosive esophagitis, gastritis, peptic ulcer disease, AVMs or neoplastic lesion  The risks and benefits as well as alternatives of endoscopic procedure(s) have been discussed and reviewed. All questions answered. The patient agrees to proceed.  We will request clearance from cardiology to hold Pradaxa for 5 days prior to the procedure  Further recommendations based on endoscopic findings   The patient was provided an opportunity to ask questions and all were answered. The patient agreed with the plan and demonstrated an understanding of the instructions.  Damaris Hippo , MD    CC: Susy Frizzle, MD

## 2020-08-19 NOTE — Telephone Encounter (Signed)
[facility] Pre-operative Risk Assessment     Request for surgical clearance:     Endoscopy Procedure  What type of surgery is being performed?     EGD / Colonoscopy  When is this surgery scheduled?     10/11/2020  What type of clearance is required ?   Pharmacy  Are there any medications that need to be held prior to surgery and how long? Pradaxa  5 days?  Practice name and name of physician performing surgery?      [facility]  What is your office phone and fax number?      Phone- [phone]  [phone]  Anesthesia type (None, local, MAC, general) ?       MAC

## 2020-08-19 NOTE — Patient Instructions (Signed)
You have been scheduled for an endoscopy and colonoscopy. Please follow the written instructions given to you at your visit today. Please pick up your prep supplies at the pharmacy within the next 1-3 days. If you use inhalers (even only as needed), please bring them with you on the day of your procedure.   Due to recent changes in healthcare laws, you may see the results of your imaging and laboratory studies on MyChart before your provider has had a chance to review them.  We understand that in some cases there may be results that are confusing or concerning to you. Not all laboratory results come back in the same time frame and the provider may be waiting for multiple results in order to interpret others.  Please give [place] 48 hours in order for your provider to thoroughly review all the results before contacting the office for clarification of your results.   If you are age 94 or older, your body mass index should be between 23-30. Your Body mass index is 29.49 kg/m. If this is out of the aforementioned range listed, please consider follow up with your Primary Care Provider.  If you are age 8 or younger, your body mass index should be between 19-25. Your Body mass index is 29.49 kg/m. If this is out of the aformentioned range listed, please consider follow up with your Primary Care Provider.    I appreciate the  opportunity to care for you  Thank You   Harl Bowie , MD

## 2020-08-20 ENCOUNTER — Other Ambulatory Visit: Payer: Self-pay | Admitting: Family Medicine

## 2020-08-20 NOTE — Progress Notes (Addendum)
CARDIOLOGY OFFICE NOTE  Date:  09/03/2020    Antonio Clark Date of Birth: 1936-05-31 Medical Record [id number]  PCP:  Susy Frizzle, MD  Cardiologist:  [place]    Chief Complaint  Patient presents with  . Follow-up    Seen for Dr. Marlou Porch & Quentin Ore    History of Present Illness: Antonio Clark is a 85 y.o. male who presents today for a follow up visit. Seen for Dr. Marlou Porch and Dr. Quentin Ore.   He has a history of known PAF with AF ablation in September of 2021 - with elevated HR's a few weeks afterwards with soft BP - found to have recurrent AF. He was cardioverted in October - 3 shocks - beta blocker was added.   Other issues includeHTN and HLD. Strong FH for CAD. Remote Cardiolite in 2013 was normal. He has been found to have OSA - but CPAP made him claustrophobic. Placed on Eliquis- changed to Pradaxa due to insurance coverage.  We have decreased his BP medicines due to fatigue and low BP. He had also been placed on iron.   Last seen by Dr. Quentin Ore a month ago - felt to be doing ok. BP ok with prior reductions made.   Comes in today. Here with his assistant "[name]" today who helps him out at times. He is doing ok. He has cut back on portions of food. Making better choices. Weight is down. He is to have colonoscopy and EGD in early March due to this prior bleeding found on hemoccult. His stools are dark - but on iron therapy. He will have some belly pain if he "eats the wrong thing" - such as pizza. No chest pain. Breathing is fine.   Was told - he says - by someone here to hold his Pradaxa after the AM dose and then for 4 days prior to GI procedures.   Past Medical History:  Diagnosis Date  . Anal fissure   . Arthritis   . Atrial fibrillation ([place])   . Back pain   . Borderline diabetic   . Colon polyps   . Dysrhythmia    a-fib  . GERD (gastroesophageal reflux disease)   . History of echocardiogram 07/ 07/ 2011  . History of lithotripsy 1989   . Hyperlipidemia   . Hypertension   . Kidney stones   . Melanoma ([place]) 05/01/2019   right chest wall (12/20)  . Pre-diabetes   . Prostate CA ([place]) 2010  . Sleep apnea    uses CPAP nightly  . Squamous cell carcinoma of skin 05/23/1992   bowens-left parietal scalp (CX35FU)  . Squamous cell carcinoma of skin 03/14/2008   in situ-left upper outer forehead-medial (CX35FU)  . Squamous cell carcinoma of skin 03/14/2008   in situ-crown of scalp (Cx35FU)  . Squamous cell carcinoma of skin 04/15/2011   in situ-right dorsal forearm (txpbx)  . Squamous cell carcinoma of skin 10/16/2011   in situ-left sideburn  . Squamous cell carcinoma of skin 03/11/2015   ka-left sideburn (CX35FU)  . Squamous cell carcinoma of skin 03/11/2015   ka-left forearm (CX35FU)  . Squamous cell carcinoma of skin 06/22/2018   in situ-left forearm, sup (txpbx)  . Squamous cell carcinoma of skin 05/01/2019   in situ-mid anterior scalp   . Squamous cell carcinoma of skin 05/01/2019   in situ-right upper arm    Past Surgical History:  Procedure Laterality Date  . ATRIAL FIBRILLATION ABLATION N/A 04/17/2020  Procedure: ATRIAL FIBRILLATION ABLATION;  Surgeon: Vickie Epley, MD;  Location: [facility];  Service: Cardiovascular;  Laterality: N/A;  . AXILLARY SENTINEL NODE BIOPSY Right 08/23/2019   Procedure: SENTINEL LYMPH NODE BIOSPY RIGHT AXILLA;  Surgeon: Stark Klein, MD;  Location: [facility];  Service: General;  Laterality: Right;  . CARDIOVERSION N/A 05/10/2020   Procedure: CARDIOVERSION;  Surgeon: Freada Bergeron, MD;  Location: [facility] ENDOSCOPY;  Service: Cardiovascular;  Laterality: N/A;  . COLONOSCOPY  2002  . FASCIECTOMY Left 03/28/2019   Procedure: SEGMENTAL FASCIECTOMY LEFT RING FINGER;  Surgeon: Daryll Brod, MD;  Location: [facility];  Service: Orthopedics;  Laterality: Left;  ANESTHESIA  AXILLARY BLOCK  . INSERTION PROSTATE RADIATION SEED  8 4 2010   per Dr  Rosana Hoes  . MELANOMA EXCISION Right 06/22/2019   Procedure: WIDE LOCAL EXCISION RIGHT CHEST WALL MELANOMA, ADVANCEMENT FLAP CLOSURE FOR DEFECT 3X6 CM;  Surgeon: Stark Klein, MD;  Location: [facility];  Service: General;  Laterality: Right;  . POLYPECTOMY  2002  . THROAT SURGERY  1980   benign cyst  . URETHRAL STRICTURE DILATATION     also penile implant     Medications: Current Meds  Medication Sig  . atorvastatin (LIPITOR) 40 MG tablet TAKE 1 TABLET (40 MG TOTAL) BY MOUTH DAILY. PLEASE STOP PRAVASTATIN  . ferrous sulfate 324 (65 Fe) MG TBEC TAKE 1 TABLET BY MOUTH EVERY DAY  . metoprolol tartrate (LOPRESSOR) 25 MG tablet Take 0.5 tablets (12.5 mg total) by mouth 2 (two) times daily.  . multivitamin (THERAGRAN) per tablet Take 1 tablet by mouth daily.  Marland Kitchen omeprazole (PRILOSEC) 20 MG capsule Take 20 mg by mouth as needed (heartburn).  Marland Kitchen PRADAXA 150 MG CAPS capsule TAKE 1 CAPSULE BY MOUTH TWICE A DAY  . pravastatin (PRAVACHOL) 40 MG tablet Take 40 mg by mouth at bedtime.   . Tamsulosin HCl (FLOMAX) 0.4 MG CAPS Take 0.4 mg by mouth daily.  . valsartan (DIOVAN) 320 MG tablet Take 0.5 tablets (160 mg total) by mouth daily.  . vitamin B-12 (CYANOCOBALAMIN) 1000 MCG tablet Take 1,000 mcg by mouth daily.  . Vitamin D3 (VITAMIN D) 25 MCG tablet Take 1,000 Units by mouth daily.     Allergies: No Known Allergies  Social History: The patient  reports that he has never smoked. He has never used smokeless tobacco. He reports current alcohol use. He reports that he does not use drugs.   Family History: The patient's family history includes Arrhythmia in his brother; Colon cancer (age of onset: 66) in his mother; Coronary artery disease in his brother; Diabetes in his brother and brother; Hearing loss in his brother; Heart disease in his brother and father; Heart failure in his brother; Hyperlipidemia in his sister; Hypertension in his brother, brother, brother, and father; Stroke in  his mother and sister; Sudden death in his father; Suicidality in his sister.   Review of Systems: Please see the history of present illness.   All other systems are reviewed and negative.   Physical Exam: VS:  BP 120/60 (BP Location: Left Arm, Patient Position: Sitting, Cuff Size: Normal)   Pulse 79   Ht 5' 7.5" (1.715 m)   Wt 188 lb (85.3 kg)   SpO2 97%   BMI 29.01 kg/m  .  BMI Body mass index is 29.01 kg/m.  Wt Readings from Last 3 Encounters:  09/03/20 188 lb (85.3 kg)  08/19/20 191 lb 2 oz (86.7 kg)  07/17/20 197 lb 3.2 oz (89.4 kg)    General: Pleasant. Well developed, well nourished and in no acute distress. His weight is down from 194 from my last visit.   Cardiac: Regular rate and rhythm. No murmurs, rubs, or gallops. No edema.  Respiratory:  Lungs are clear to auscultation bilaterally with normal work of breathing.  GI: Soft and nontender.  MS: No deformity or atrophy. Gait and ROM intact.  Skin: Warm and dry. Color is normal.  Neuro:  Strength and sensation are intact and no gross focal deficits noted.  Psych: Alert, appropriate and with normal affect.   LABORATORY DATA:  EKG:  EKG is not ordered today.    Lab Results  Component Value Date   WBC 6.7 07/08/2020   HGB 12.4 (L) 07/08/2020   HCT 36.8 (L) 07/08/2020   PLT 157 07/08/2020   GLUCOSE 168 (H) 07/08/2020   CHOL 146 06/07/2020   TRIG 167 (H) 06/07/2020   HDL 48 06/07/2020   LDLCALC 73 06/07/2020   ALT 13 06/07/2020   AST 15 06/07/2020   NA 140 07/08/2020   K 4.6 07/08/2020   CL 108 07/08/2020   CREATININE 1.24 (H) 07/08/2020   BUN 21 07/08/2020   CO2 25 07/08/2020   TSH 1.98 06/07/2020   INR 1.0 03/06/2009   HGBA1C 7.0 (H) 06/07/2020   MICROALBUR 46.2 06/07/2020     BNP (last 3 results) No results for input(s): BNP in the last 8760 hours.  ProBNP (last 3 results) No results for input(s): PROBNP in the last 8760 hours.   Other Studies Reviewed Today:  ECHO IMPRESSIONS 04/2020   1.  Inferior basal hypokinesis. Left ventricular ejection fraction, by  estimation, is 50 to 55%. The left ventricle has low normal function. The  left ventricle demonstrates regional wall motion abnormalities (see  scoring diagram/findings for  description). Left ventricular diastolic parameters are indeterminate.   2. Right ventricular systolic function is normal. The right ventricular  size is normal. There is normal pulmonary artery systolic pressure.   3. Left atrial size was mildly dilated.   4. The mitral valve is normal in structure. Trivial mitral valve  regurgitation. No evidence of mitral stenosis.   5. The aortic valve is tricuspid. Aortic valve regurgitation is trivial.  Mild aortic valve sclerosis is present, with no evidence of aortic valve  stenosis.   6. Aortic dilatation noted. There is mild dilatation of the ascending  aorta measuring 39 mm.   7. The inferior vena cava is normal in size with greater than 50%  respiratory variability, suggesting right atrial pressure of 3 mmHg.      Assessment and Plan:   1. HTN - but with prior hypotension necessitating the reduction in his medicines - now doing well - no changes made today.   2. Prior hemoccult + stool card - ok to have colonoscopy/EGD - will clarify with pharmacy about holding of Pradaxa.   3. Noted weight loss - somewhat intentional but still concerning.   4. PAF - prior ablation from September and prior cardioversion back in October - remains in sinus by exam.   5. OSA - not able to tolerate CPAP  6. HLD - on statin.    Current medicines are reviewed with the patient today.  The patient does not have concerns regarding medicines other than what has been noted above.  The following changes have been made:  See above.  Labs/ tests ordered today include:   No orders of  the defined types were placed in this encounter.    Disposition:   FU with Dr. Marlou Porch in 3 to 4 months. See Dr. Quentin Ore in June as planned.  Will ask pharmacy to weigh in about holding Pradaxa prior to GI procedures scheduled for early March.     Patient is agreeable to this plan and will call if any problems develop in the interim.   SignedTruitt Merle, NP  09/03/2020 3:07 PM  [facility] [address] [place], [place]  [address] Phone: [phone] Fax: [phone]      Addendum - his clearance regarding Pradaxa is listed under medication management and is as follows:  "Patient with diagnosis of afib on Pradaxa for anticoagulation.    Procedure: EGD / Colonoscopy Date of procedure: 10/11/20    CHA2DS2-VASc Score = 3  This indicates a 3.2% annual risk of stroke. The patient's score is based upon: CHF History: No HTN History: Yes Diabetes History: No Stroke History: No Vascular Disease History: No Age Score: 2 Gender Score: 0      CrCl 43 ml/min  Per office protocol, patient can hold Pradaxa for 4-5 days prior to procedure."  Patient will be notified.   Burtis Junes, RN, [facility] [address] [place], [place]  [address] [phone]

## 2020-08-20 NOTE — Telephone Encounter (Signed)
Left message for the patient to call back and speak to the on call preop APP 

## 2020-08-23 NOTE — Telephone Encounter (Signed)
Patient has been notified

## 2020-09-01 ENCOUNTER — Other Ambulatory Visit: Payer: Self-pay | Admitting: Family Medicine

## 2020-09-03 ENCOUNTER — Ambulatory Visit: Payer: Medicare Other | Admitting: Nurse Practitioner

## 2020-09-03 ENCOUNTER — Encounter: Payer: Self-pay | Admitting: Nurse Practitioner

## 2020-09-03 ENCOUNTER — Other Ambulatory Visit: Payer: Self-pay

## 2020-09-03 VITALS — BP 120/60 | HR 79 | Ht 67.5 in | Wt 188.0 lb

## 2020-09-03 DIAGNOSIS — I4819 Other persistent atrial fibrillation: Secondary | ICD-10-CM

## 2020-09-03 DIAGNOSIS — I10 Essential (primary) hypertension: Secondary | ICD-10-CM | POA: Diagnosis not present

## 2020-09-03 DIAGNOSIS — Z7901 Long term (current) use of anticoagulants: Secondary | ICD-10-CM

## 2020-09-03 DIAGNOSIS — E7800 Pure hypercholesterolemia, unspecified: Secondary | ICD-10-CM | POA: Diagnosis not present

## 2020-09-03 NOTE — Patient Instructions (Addendum)
After Visit Summary:  We will be checking the following labs today - NONE   Medication Instructions:    Continue with your current medicines.    If you need a refill on your cardiac medications before your next appointment, please call your pharmacy.     Testing/Procedures To Be Arranged:  N/A  Follow-Up:   See Dr. Marlou Porch in 3 to 4 months  See Dr. Quentin Ore in June    At [facility], you and your health needs are our priority.  As part of our continuing mission to provide you with exceptional heart care, we have created designated Provider Care Teams.  These Care Teams include your primary Cardiologist (physician) and Advanced Practice Providers (APPs -  Physician Assistants and Nurse Practitioners) who all work together to provide you with the care you need, when you need it.  Special Instructions:  . Stay safe, wash your hands for at least 20 seconds and wear a mask when needed.  . It was good to talk with you today.  . We will be in touch about holding the Pradaxa for your upcoming procedures.    Call the [place] office at [phone] if you have any questions, problems or concerns.

## 2020-09-04 ENCOUNTER — Telehealth: Payer: Self-pay | Admitting: *Deleted

## 2020-09-04 NOTE — Telephone Encounter (Signed)
-----   Message from Burtis Junes, NP sent at 09/03/2020  4:14 PM EST ----- Can you let him know his Pradaxa hold is listed under medication management - not a pre op clearance note - ok to hold for 4 to 5 days prior to GI studies.   lori

## 2020-09-04 NOTE — Telephone Encounter (Signed)
lvm with Lori's message.

## 2020-09-09 ENCOUNTER — Encounter: Payer: Self-pay | Admitting: Gastroenterology

## 2020-09-11 ENCOUNTER — Ambulatory Visit: Payer: Medicare Other | Admitting: Gastroenterology

## 2020-10-01 ENCOUNTER — Encounter: Payer: Self-pay | Admitting: Cardiology

## 2020-10-11 ENCOUNTER — Encounter: Payer: Self-pay | Admitting: Gastroenterology

## 2020-10-11 ENCOUNTER — Other Ambulatory Visit: Payer: Self-pay

## 2020-10-11 ENCOUNTER — Ambulatory Visit (AMBULATORY_SURGERY_CENTER): Payer: Medicare Other | Admitting: Gastroenterology

## 2020-10-11 VITALS — BP 118/35 | HR 68 | Temp 97.2°F | Resp 16 | Ht 67.0 in | Wt 191.0 lb

## 2020-10-11 DIAGNOSIS — D509 Iron deficiency anemia, unspecified: Secondary | ICD-10-CM | POA: Diagnosis not present

## 2020-10-11 DIAGNOSIS — K319 Disease of stomach and duodenum, unspecified: Secondary | ICD-10-CM

## 2020-10-11 DIAGNOSIS — K2971 Gastritis, unspecified, with bleeding: Secondary | ICD-10-CM

## 2020-10-11 DIAGNOSIS — D123 Benign neoplasm of transverse colon: Secondary | ICD-10-CM

## 2020-10-11 DIAGNOSIS — K2970 Gastritis, unspecified, without bleeding: Secondary | ICD-10-CM

## 2020-10-11 DIAGNOSIS — K648 Other hemorrhoids: Secondary | ICD-10-CM | POA: Diagnosis not present

## 2020-10-11 MED ORDER — SUCRALFATE 1 G PO TABS: 1.0000 g | ORAL_TABLET | Freq: Two times a day (BID) | ORAL | 0 refills | Status: DC

## 2020-10-11 MED ORDER — SODIUM CHLORIDE 0.9 % IV SOLN: 500.0000 mL | Freq: Once | INTRAVENOUS | Status: DC

## 2020-10-11 MED ORDER — PANTOPRAZOLE SODIUM 40 MG PO TBEC: 40.0000 mg | DELAYED_RELEASE_TABLET | Freq: Every day | ORAL | 0 refills | Status: DC

## 2020-10-11 NOTE — Progress Notes (Signed)
Called to room to assist during endoscopic procedure.  Patient ID and intended procedure confirmed with present staff. Received instructions for my participation in the procedure from the performing physician.  

## 2020-10-11 NOTE — Progress Notes (Signed)
PT taken to PACU. Monitors in place. VSS. Report given to RN. 

## 2020-10-11 NOTE — Progress Notes (Signed)
Fluid bolus given- 550mL for hypotension.

## 2020-10-11 NOTE — Op Note (Signed)
[facility] Patient Name: Antonio Clark Procedure Date: 10/11/2020 9:19 AM MRN: [id number] Endoscopist: Mauri Pole , MD Age: 85 Referring MD:  Date of Birth: 03/13/1936 Gender: Male Account #: [id number] Procedure:                Upper GI endoscopy Indications:              Suspected upper gastrointestinal bleeding in                            patient with unexplained iron deficiency anemia Medicines:                Monitored Anesthesia Care Procedure:                Pre-Anesthesia Assessment:                           - Prior to the procedure, a History and Physical                            was performed, and patient medications and                            allergies were reviewed. The patient's tolerance of                            previous anesthesia was also reviewed. The risks                            and benefits of the procedure and the sedation                            options and risks were discussed with the patient.                            All questions were answered, and informed consent                            was obtained. Prior Anticoagulants: The patient                            last took Pradaxa (dabigatran) 5 days prior to the                            procedure. ASA Grade Assessment: III - A patient                            with severe systemic disease. After reviewing the                            risks and benefits, the patient was deemed in                            satisfactory condition to undergo the procedure.  After obtaining informed consent, the endoscope was                            passed under direct vision. Throughout the                            procedure, the patient's blood pressure, pulse, and                            oxygen saturations were monitored continuously. The                            Endoscope was introduced through the mouth, and                            advanced  to the second part of duodenum. The upper                            GI endoscopy was accomplished without difficulty.                            The patient tolerated the procedure well. Scope In: Scope Out: Findings:                 The esophagus was normal.                           Patchy moderate inflammation with hemorrhage                            characterized by adherent blood, congestion                            (edema), erythema and linear erosions was found in                            the entire examined stomach. Biopsies were taken                            with a cold forceps for histology. Biopsies were                            taken with a cold forceps for Helicobacter pylori                            testing.                           The examined duodenum was normal. Complications:            No immediate complications. Estimated Blood Loss:     Estimated blood loss was minimal. Impression:               - Normal esophagus.                           -  Erosive gastritis with hemorrhage. Biopsied.                           - Normal examined duodenum. Recommendation:           - Patient has a contact number available for                            emergencies. The signs and symptoms of potential                            delayed complications were discussed with the                            patient. Return to normal activities tomorrow.                            Written discharge instructions were provided to the                            patient.                           - Resume previous diet.                           - Continue present medications.                           - Use Protonix (pantoprazole) 40 mg PO daily for 3                            months.                           - Use sucralfate tablets 1 gram PO BID for 4 weeks.                           - Return to GI office in 3 months. Please call to                            schedule  appointment.                           - Resume Pradaxa (dabigatran) at prior dose                            tomorrow. Refer to managing physician for further                            adjustment of therapy. Mauri Pole, MD 10/11/2020 10:16:32 AM This report has been signed electronically.

## 2020-10-11 NOTE — Progress Notes (Signed)
[facility] 

## 2020-10-11 NOTE — Patient Instructions (Signed)
You may resume your pradaxa at the prior does tomorrow. Call to schedule an office visit with Dr. Silverio Decamp for 3 months out.  Avoid NSAIDS: ibuprofen, aspirin, aleve and BC powders.  Read all of the handouts given to you by your recovery room nurse.   YOU HAD AN ENDOSCOPIC PROCEDURE TODAY AT [facility]:   Refer to the procedure report that was given to you for any specific questions about what was found during the examination.  If the procedure report does not answer your questions, please call your gastroenterologist to clarify.  If you requested that your care partner not be given the details of your procedure findings, then the procedure report has been included in a sealed envelope for you to review at your convenience later.  YOU SHOULD EXPECT: Some feelings of bloating in the abdomen. Passage of more gas than usual.  Walking can help get rid of the air that was put into your GI tract during the procedure and reduce the bloating. If you had a lower endoscopy (such as a colonoscopy or flexible sigmoidoscopy) you may notice spotting of blood in your stool or on the toilet paper. If you underwent a bowel prep for your procedure, you may not have a normal bowel movement for a few days.  Please Note:  You might notice some irritation and congestion in your nose or some drainage.  This is from the oxygen used during your procedure.  There is no need for concern and it should clear up in a day or so.  SYMPTOMS TO REPORT IMMEDIATELY:   Following lower endoscopy (colonoscopy or flexible sigmoidoscopy):  Excessive amounts of blood in the stool  Significant tenderness or worsening of abdominal pains  Swelling of the abdomen that is new, acute  Fever of 100F or higher   Following upper endoscopy (EGD)  Vomiting of blood or coffee ground material  New chest pain or pain under the shoulder blades  Painful or persistently difficult swallowing  New shortness of breath  Fever of 100F  or higher  Black, tarry-looking stools  For urgent or emergent issues, a gastroenterologist can be reached at any hour by calling [phone]. Do not use MyChart messaging for urgent concerns.    DIET:  We do recommend a small meal at first, but then you may proceed to your regular diet.  Drink plenty of fluids but you should avoid alcoholic beverages for 24 hours.  ACTIVITY:  You should plan to take it easy for the rest of today and you should NOT DRIVE or use heavy machinery until tomorrow (because of the sedation medicines used during the test).    FOLLOW UP: Our staff will call the number listed on your records 48-72 hours following your procedure to check on you and address any questions or concerns that you may have regarding the information given to you following your procedure. If we do not reach you, we will leave a message.  We will attempt to reach you two times.  During this call, we will ask if you have developed any symptoms of COVID 19. If you develop any symptoms (ie: fever, flu-like symptoms, shortness of breath, cough etc.) before then, please call [phone].  If you test positive for Covid 19 in the 2 weeks post procedure, please call and report this information to [place].    If any biopsies were taken you will be contacted by phone or by letter within the next 1-3 weeks.  Please call us  at [phone] if you have not heard about the biopsies in 3 weeks.    SIGNATURES/CONFIDENTIALITY: You and/or your care partner have signed paperwork which will be entered into your electronic medical record.  These signatures attest to the fact that that the information above on your After Visit Summary has been reviewed and is understood.  Full responsibility of the confidentiality of this discharge information lies with you and/or your care-partner.

## 2020-10-11 NOTE — Op Note (Signed)
[place] Patient Name: Antonio Clark Procedure Date: 10/11/2020 9:18 AM MRN: [id number] Endoscopist: Mauri Pole , MD Age: 85 Referring MD:  Date of Birth: 02-May-1936 Gender: Male Account #: [id number] Procedure:                Colonoscopy Indications:              Unexplained iron deficiency anemia Medicines:                Monitored Anesthesia Care Procedure:                Pre-Anesthesia Assessment:                           - Prior to the procedure, a History and Physical                            was performed, and patient medications and                            allergies were reviewed. The patient's tolerance of                            previous anesthesia was also reviewed. The risks                            and benefits of the procedure and the sedation                            options and risks were discussed with the patient.                            All questions were answered, and informed consent                            was obtained. Prior Anticoagulants: The patient                            last took Pradaxa (dabigatran) 5 days prior to the                            procedure. ASA Grade Assessment: III - A patient                            with severe systemic disease. After reviewing the                            risks and benefits, the patient was deemed in                            satisfactory condition to undergo the procedure.                           After obtaining informed consent, the colonoscope  was passed under direct vision. Throughout the                            procedure, the patient's blood pressure, pulse, and                            oxygen saturations were monitored continuously. The                            Olympus PCF-H190DL (#[id number]) Colonoscope was                            introduced through the anus and advanced to the the                            cecum, identified by  appendiceal orifice and                            ileocecal valve. The colonoscopy was performed                            without difficulty. The patient tolerated the                            procedure well. The quality of the bowel                            preparation was good. The ileocecal valve,                            appendiceal orifice, and rectum were photographed. Scope In: 9:44:03 AM Scope Out: 10:05:25 AM Scope Withdrawal Time: 0 hours 18 minutes 21 seconds  Total Procedure Duration: 0 hours 21 minutes 22 seconds  Findings:                 The perianal and digital rectal examinations were                            normal.                           Three sessile polyps were found in the transverse                            colon. The polyps were 4 to 7 mm in size. These                            polyps were removed with a cold snare. Resection                            and retrieval were complete.                           Non-bleeding external and internal hemorrhoids were  found during retroflexion. The hemorrhoids were                            medium-sized. Complications:            No immediate complications. Estimated Blood Loss:     Estimated blood loss was minimal. Impression:               - Three 4 to 7 mm polyps in the transverse colon,                            removed with a cold snare. Resected and retrieved.                           - Non-bleeding external and internal hemorrhoids. Recommendation:           - Patient has a contact number available for                            emergencies. The signs and symptoms of potential                            delayed complications were discussed with the                            patient. Return to normal activities tomorrow.                            Written discharge instructions were provided to the                            patient.                           - Resume  previous diet.                           - Continue present medications.                           - No repeat colonoscopy due to age.                           - See the other procedure note for documentation of                            additional recommendations. Mauri Pole, MD 10/11/2020 10:18:52 AM This report has been signed electronically.

## 2020-10-11 NOTE — Progress Notes (Signed)
Patient came in with abd pain of #8.  5 minutes after passing gas pain is #4

## 2020-10-15 ENCOUNTER — Telehealth: Payer: Self-pay | Admitting: *Deleted

## 2020-10-15 ENCOUNTER — Telehealth: Payer: Self-pay

## 2020-10-15 NOTE — Telephone Encounter (Signed)
Second post procedure follow up call, no answer 

## 2020-10-15 NOTE — Telephone Encounter (Signed)
Attempted f/u phone call. No answer. Left message. °

## 2020-10-17 ENCOUNTER — Ambulatory Visit (INDEPENDENT_AMBULATORY_CARE_PROVIDER_SITE_OTHER): Payer: 59 | Admitting: Nurse Practitioner

## 2020-10-17 ENCOUNTER — Other Ambulatory Visit: Payer: Self-pay

## 2020-10-17 ENCOUNTER — Encounter: Payer: Self-pay | Admitting: Nurse Practitioner

## 2020-10-17 VITALS — BP 116/84 | HR 82 | Temp 97.2°F | Ht 67.0 in | Wt 175.0 lb

## 2020-10-17 DIAGNOSIS — F32A Depression, unspecified: Secondary | ICD-10-CM

## 2020-10-17 DIAGNOSIS — F419 Anxiety disorder, unspecified: Secondary | ICD-10-CM

## 2020-10-17 MED ORDER — ESCITALOPRAM OXALATE 10 MG PO TABS: 10.0000 mg | ORAL_TABLET | Freq: Every day | ORAL | 1 refills | Status: DC

## 2020-10-17 MED ORDER — LORAZEPAM 0.5 MG PO TABS: 0.5000 mg | ORAL_TABLET | Freq: Every day | ORAL | 1 refills | Status: DC | PRN

## 2020-10-17 NOTE — Patient Instructions (Addendum)
2 month check up with Dr. Dennard Schaumann Take lorazepam about every 3 days or so. If you notice reaching for the lorazepam more frequently, start daily medication (Lexapro).   Managing Stress, Adult Feeling a certain amount of stress is normal. Stress helps our body and mind get ready to deal with the demands of life. Stress hormones can motivate you to do well at work and meet your responsibilities. However severe or long-lasting (chronic) stress can affect your mental and physical health. Chronic stress puts you at higher risk for anxiety, depression, and other health problems like digestive problems, muscle aches, heart disease, high blood pressure, and stroke. What are the causes? Common causes of stress include:  Demands from work, such as deadlines, feeling overworked, or having long hours.  Pressures at home, such as money issues, disagreements with a spouse, or parenting issues.  Pressures from major life changes, such as divorce, moving, loss of a loved one, or chronic illness. You may be at higher risk for stress-related problems if you do not get enough sleep, are in poor health, do not have emotional support, or have a mental health disorder like anxiety or depression. How to recognize stress Stress can make you:  Have trouble sleeping.  Feel sad, anxious, irritable, or overwhelmed.  Lose your appetite.  Overeat or want to eat unhealthy foods.  Want to use drugs or alcohol. Stress can also cause physical symptoms, such as:  Sore, tense muscles, especially in the shoulders and neck.  Headaches.  Trouble breathing.  A faster heart rate.  Stomach pain, nausea, or vomiting.  Diarrhea or constipation.  Trouble concentrating. Follow these instructions at home: Lifestyle  Identify the source of your stress and your reaction to it. See a therapist who can help you change your reactions.  When there are stressful events: ? Talk about it with family, friends, or  co-workers. ? Try to think realistically about stressful events and not ignore them or overreact. ? Try to find the positives in a stressful situation and not focus on the negatives. ? Cut back on responsibilities at work and home, if possible. Ask for help from friends or family members if you need it.  Find ways to cope with stress, such as: ? Meditation. ? Deep breathing. ? Yoga or tai chi. ? Progressive muscle relaxation. ? Doing art, playing music, or reading. ? Making time for fun activities. ? Spending time with family and friends.  Get support from family, friends, or spiritual resources. Eating and drinking  Eat a healthy diet. This includes: ? Eating foods that are high in fiber, such as beans, whole grains, and fresh fruits and vegetables. ? Limiting foods that are high in fat and processed sugars, such as fried and sweet foods.  Do not skip meals or overeat.  Drink enough fluid to keep your urine pale yellow. Alcohol use  Do not drink alcohol if: ? Your health care provider tells you not to drink. ? You are pregnant, may be pregnant, or are planning to become pregnant.  Drinking alcohol is a way some people try to ease their stress. This can be dangerous, so if you drink alcohol: ? Limit how much you use to:  0-1 drink a day for women.  0-2 drinks a day for men. ? Be aware of how much alcohol is in your drink. In the [place] one drink equals one 12 oz bottle of beer (355 mL), one 5 oz glass of wine (148 mL), or one 1 oz  glass of hard liquor (44 mL). Activity  Include 30 minutes of exercise in your daily schedule. Exercise is a good stress reducer.  Include time in your day for an activity that you find relaxing. Try taking a walk, going on a bike ride, reading a book, or listening to music.  Schedule your time in a way that lowers stress, and keep a consistent schedule. Prioritize what is most important to get done.   General instructions  Get enough sleep.  Try to go to sleep and get up at about the same time every day.  Take over-the-counter and prescription medicines only as told by your health care provider.  Do not use any products that contain nicotine or tobacco, such as cigarettes, e-cigarettes, and chewing tobacco. If you need help quitting, ask your health care provider.  Do not use drugs or smoke to cope with stress.  Keep all follow-up visits as told by your health care provider. This is important. Where to find support  Talk with your health care provider about stress management or finding a support group.  Find a therapist to work with you on your stress management techniques. Contact a health care provider if:  Your stress symptoms get worse.  You are unable to manage your stress at home.  You are struggling to stop using drugs or alcohol. Get help right away if:  You may be a danger to yourself or others.  You have any thoughts of death or suicide. If you ever feel like you may hurt yourself or others, or have thoughts about taking your own life, get help right away. You can go to your nearest emergency department or call:  Your local emergency services (911 in the [place]  A suicide crisis helpline, such as the [place] at 587-180-4030. This is open 24 hours a day. Summary  Feeling a certain amount of stress is normal, but severe or long-lasting (chronic) stress can affect your mental and physical health.  Chronic stress can put you at higher risk for anxiety, depression, and other health problems like digestive problems, muscle aches, heart disease, high blood pressure, and stroke.  You may be at higher risk for stress-related problems if you do not get enough sleep, are in poor health, lack emotional support, or have a mental health disorder like anxiety or depression.  Identify the source of your stress and your reaction to it. Try talking about stressful events with family, friends,  or co-workers, finding a coping method, or getting support from spiritual resources.  If you need more help, talk with your health care provider about finding a support group or a mental health therapist. This information is not intended to replace advice given to you by your health care provider. Make sure you discuss any questions you have with your health care provider. Document Revised: 02/22/2019 Document Reviewed: 02/22/2019 Elsevier Patient Education  [employer].

## 2020-10-17 NOTE — Progress Notes (Signed)
Subjective:    Patient ID: Antonio Clark, male    DOB: 1936/03/20, 85 y.o.   MRN: [id number]  HPI: Antonio Clark is a 85 y.o. male presenting for increased anxiety for the past few months.    Chief Complaint  Patient presents with  . Anxiety    Anxiety due to medical findings, wanting medication to help him calm down   Reports he had a colonoscopy on Friday and states they found ulcers in his stomach that were bleeding and polyps in his colon.  He has also been more worried lately about the relationship he had for 8 years that ended 3 weeks ago; he is having a tough time concentrating and working through this.  He reports he has a good support system, has been reaching out to a lot of friends and feels supported in this way.  States his mom had depression and his sister committed suicide in the 15s.  ANXIETY/STRESS Duration: Months Anxious mood: yes  Excessive worrying: yes Irritability: no  Sweating: yes Nausea: no Palpitations:no Hyperventilation: no Panic attacks: no Agoraphobia: no  Obscessions/compulsions: no Depressed mood: yes Depression screen [facility] 2/9 10/17/2020 06/07/2020 02/14/2019 01/28/2018 01/28/2018  Decreased Interest 1 0 0 0 0  Down, Depressed, Hopeless 1 0 0 0 0  PHQ - 2 Score 2 0 0 0 0  Altered sleeping 1 - - - -  Tired, decreased energy 2 - - - -  Change in appetite 1 - - - -  Feeling bad or failure about yourself  1 - - - -  Trouble concentrating 2 - - - -  Moving slowly or fidgety/restless 2 - - - -  Suicidal thoughts 0 - - - -  PHQ-9 Score 11 - - - -  Difficult doing work/chores Somewhat difficult - - - -    GAD 7 : Generalized Anxiety Score 10/17/2020  Nervous, Anxious, on Edge 2  Control/stop worrying 1  Worry too much - different things 1  Trouble relaxing 1  Restless 2  Easily annoyed or irritable 1  Afraid - awful might happen 1  Total GAD 7 Score 9  Anxiety Difficulty Somewhat difficult   Anhedonia: yes Weight changes: yes Insomnia:  no   Hypersomnia: no Fatigue/loss of energy: yes Feelings of worthlessness: yes Feelings of guilt: yes Impaired concentration/indecisiveness: yes Suicidal ideations: no  Crying spells: no Recent Stressors/Life Changes: yes   Relationship problems: yes   Family stress: no     Financial stress: no    Job stress: no    Recent death/loss: no  No Known Allergies  Outpatient Encounter Medications as of 10/17/2020  Medication Sig Note  . atorvastatin (LIPITOR) 40 MG tablet TAKE 1 TABLET (40 MG TOTAL) BY MOUTH DAILY. PLEASE STOP PRAVASTATIN   . escitalopram (LEXAPRO) 10 MG tablet Take 1 tablet (10 mg total) by mouth daily.   . ferrous sulfate 324 (65 Fe) MG TBEC TAKE 1 TABLET BY MOUTH EVERY DAY   . LORazepam (ATIVAN) 0.5 MG tablet Take 1 tablet (0.5 mg total) by mouth daily as needed for anxiety.   . metoprolol tartrate (LOPRESSOR) 25 MG tablet Take 0.5 tablets (12.5 mg total) by mouth 2 (two) times daily.   . multivitamin (THERAGRAN) per tablet Take 1 tablet by mouth daily.   Marland Kitchen omeprazole (PRILOSEC) 20 MG capsule Take 20 mg by mouth as needed (heartburn).   . pantoprazole (PROTONIX) 40 MG tablet Take 1 tablet (40 mg total) by mouth daily.   Marland Kitchen  PRADAXA 150 MG CAPS capsule TAKE 1 CAPSULE BY MOUTH TWICE A DAY 10/11/2020: Last dose 10-06-20 for EGD/Colonoscopy  . pravastatin (PRAVACHOL) 40 MG tablet Take 40 mg by mouth at bedtime.    . sucralfate (CARAFATE) 1 g tablet Take 1 tablet (1 g total) by mouth 2 (two) times daily.   . Tamsulosin HCl (FLOMAX) 0.4 MG CAPS Take 0.4 mg by mouth daily.   . valsartan (DIOVAN) 320 MG tablet Take 0.5 tablets (160 mg total) by mouth daily.   . vitamin B-12 (CYANOCOBALAMIN) 1000 MCG tablet Take 1,000 mcg by mouth daily.   . Vitamin D3 (VITAMIN D) 25 MCG tablet Take 1,000 Units by mouth daily.    No facility-administered encounter medications on file as of 10/17/2020.    Patient Active Problem List   Diagnosis Date Noted  . Melanoma ([place])   . Atrial  fibrillation ([place])   . Malignant neoplasm of prostate ([place]) 07/15/2015  . Recurrent nephrolithiasis 07/15/2015  . Personal history of prostate cancer 04/25/2015  . 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 Coronary artery disease 11/02/2011  . Urethral stricture 06/22/2011  . Hyperlipidemia   . Borderline diabetic   . Hypertension     Past Medical History:  Diagnosis Date  . Anal fissure   . Arthritis   . Atrial fibrillation ([place])   . Back pain   . Borderline diabetic   . Colon polyps   . Dysrhythmia    a-fib  . GERD (gastroesophageal reflux disease)   . History of echocardiogram 07/ 07/ 2011  . History of lithotripsy 1989  . Hyperlipidemia   . Hypertension   . Kidney stones   . Melanoma ([place]) 05/01/2019   right chest wall (12/20)  . Pre-diabetes   . Prostate CA ([place]) 2010  . Sleep apnea    uses CPAP nightly  . Squamous cell carcinoma of skin 05/23/1992   bowens-left parietal scalp (CX35FU)  . Squamous cell carcinoma of skin 03/14/2008   in situ-left upper outer forehead-medial (CX35FU)  . Squamous cell carcinoma of skin 03/14/2008   in situ-crown of scalp (Cx35FU)  . Squamous cell carcinoma of skin 04/15/2011   in situ-right dorsal forearm (txpbx)  . Squamous cell carcinoma of skin 10/16/2011   in situ-left sideburn  . Squamous cell carcinoma of skin 03/11/2015   ka-left sideburn (CX35FU)  . Squamous cell carcinoma of skin 03/11/2015   ka-left forearm (CX35FU)  . Squamous cell carcinoma of skin 06/22/2018   in situ-left forearm, sup (txpbx)  . Squamous cell carcinoma of skin 05/01/2019   in situ-mid anterior scalp   . Squamous cell carcinoma of skin 05/01/2019   in situ-right upper arm    Relevant past medical, surgical, family and social history reviewed and updated as indicated. Interim medical history since our last visit reviewed.  Review of Systems  Constitutional: Positive for appetite change and  unexpected weight change. Negative for activity change, diaphoresis, fatigue and fever.  Skin: Negative.   Neurological: Negative.   Psychiatric/Behavioral: Positive for decreased concentration. Negative for behavioral problems, dysphoric mood, hallucinations, sleep disturbance and suicidal ideas. The patient is nervous/anxious. The patient is not hyperactive.     Per HPI unless specifically indicated above     Objective:    BP 116/84   Pulse 82   Temp (!) 97.2 F (36.2 C)   Ht 5\' 7"  (1.702 m)   Wt 175 lb (79.4 kg)   SpO2 96%  BMI 27.41 kg/m   Wt Readings from Last 3 Encounters:  10/17/20 175 lb (79.4 kg)  10/11/20 191 lb (86.6 kg)  09/03/20 188 lb (85.3 kg)    Physical Exam Vitals and nursing note reviewed.  Constitutional:      General: He is not in acute distress.    Appearance: Normal appearance. He is not toxic-appearing.  Skin:    Coloration: Skin is not jaundiced or pale.     Findings: No erythema.  Neurological:     Mental Status: He is alert and oriented to person, place, and time.     Motor: No weakness.     Gait: Gait normal.  Psychiatric:        Mood and Affect: Mood normal.        Behavior: Behavior normal.        Thought Content: Thought content normal.        Judgment: Judgment normal.       Assessment & Plan:  1. Anxiety and depression Increased for the past couple of months.  GAD-7 and PHQ-9 both elevated today.  No SI/HI today.  Discussed options with " rescue treatment" with benzodiazepines and discussed risk of benzodiazepines in older adults including falls, confusion, sedation.  Also discussed daily medications that are much safer, however would need to be taken daily and may take a couple of weeks to start feeling benefit.  Patient would like to not be on a daily medication and would like to start with benzodiazepine, aware of the risks.  We will start low-dose lorazepam to be taken by mouth daily as needed.  Extensively educated to use with  caution and do not use prior to operating heavy machinery or driving.  We also gave prescription for daily medication -SSRI-to start using if noticing he is reaching more frequently for lorazepam than 1-2 times per week.  Patient verbalizes understanding to this treatment plan.  Recommended counseling/therapy to also help with symptoms.  Follow-up with PCP in 2 months or sooner if no benefit in this treatment plan.   - LORazepam (ATIVAN) 0.5 MG tablet; Take 1 tablet (0.5 mg total) by mouth daily as needed for anxiety.  Dispense: 30 tablet; Refill: 1 - escitalopram (LEXAPRO) 10 MG tablet; Take 1 tablet (10 mg total) by mouth daily.  Dispense: 30 tablet; Refill: 1    Follow up plan: Return in about 2 months (around 12/17/2020).

## 2020-11-02 ENCOUNTER — Other Ambulatory Visit: Payer: Self-pay | Admitting: Gastroenterology

## 2020-11-02 DIAGNOSIS — D509 Iron deficiency anemia, unspecified: Secondary | ICD-10-CM

## 2020-11-02 DIAGNOSIS — K2970 Gastritis, unspecified, without bleeding: Secondary | ICD-10-CM

## 2020-11-04 ENCOUNTER — Encounter: Payer: Self-pay | Admitting: Gastroenterology

## 2020-11-14 ENCOUNTER — Other Ambulatory Visit: Payer: Self-pay | Admitting: *Deleted

## 2020-11-14 DIAGNOSIS — F419 Anxiety disorder, unspecified: Secondary | ICD-10-CM

## 2020-11-14 MED ORDER — ESCITALOPRAM OXALATE 10 MG PO TABS: 10.0000 mg | ORAL_TABLET | Freq: Every day | ORAL | 1 refills | Status: DC

## 2020-12-01 ENCOUNTER — Other Ambulatory Visit: Payer: Self-pay | Admitting: Gastroenterology

## 2020-12-01 DIAGNOSIS — D509 Iron deficiency anemia, unspecified: Secondary | ICD-10-CM

## 2020-12-01 DIAGNOSIS — K2970 Gastritis, unspecified, without bleeding: Secondary | ICD-10-CM

## 2020-12-09 ENCOUNTER — Encounter: Payer: Self-pay | Admitting: Nurse Practitioner

## 2020-12-09 ENCOUNTER — Other Ambulatory Visit: Payer: Self-pay

## 2020-12-09 ENCOUNTER — Telehealth (INDEPENDENT_AMBULATORY_CARE_PROVIDER_SITE_OTHER): Payer: 59 | Admitting: Nurse Practitioner

## 2020-12-09 DIAGNOSIS — J302 Other seasonal allergic rhinitis: Secondary | ICD-10-CM | POA: Diagnosis not present

## 2020-12-09 DIAGNOSIS — J069 Acute upper respiratory infection, unspecified: Secondary | ICD-10-CM | POA: Diagnosis not present

## 2020-12-09 MED ORDER — CETIRIZINE HCL 10 MG PO TABS: 10.0000 mg | ORAL_TABLET | Freq: Every day | ORAL | 11 refills | Status: DC

## 2020-12-09 MED ORDER — GUAIFENESIN ER 600 MG PO TB12: 600.0000 mg | ORAL_TABLET | Freq: Two times a day (BID) | ORAL | 0 refills | Status: DC | PRN

## 2020-12-09 MED ORDER — FLUTICASONE PROPIONATE 50 MCG/ACT NA SUSP: 2.0000 | Freq: Every day | NASAL | 6 refills | Status: DC

## 2020-12-09 NOTE — Progress Notes (Signed)
Subjective:    Patient ID: Antonio Clark, male    DOB: 11-Oct-1935, 85 y.o.   MRN: [id number]  HPI: Antonio Clark is a 85 y.o. male presenting virtually for cough.  Chief Complaint  Patient presents with  . Cough    Sx began after mowing the grass past Friday, runny nose cough. Taking saline for nose. Stomach hurts from the cough. No other sx.    UPPER RESPIRATORY TRACT INFECTION Onset: days - started Friday after mowing COVID testing history: not tested  COVID vaccination status: fully vaccinated with 2 Pfizer vaccines and booster Fever: no Cough: yes; productive Shortness of breath: no Wheezing: yes ; better after coughing Chest pain: no Chest tightness: no Chest congestion: yes Nasal congestion: yes Runny nose: yes Post nasal drip: no Sneezing: yes Sore throat: no Swollen glands: no Sinus pressure: no Headache: no Face pain: no Toothache: no Ear pain: no  Ear pressure: no  Eyes red/itching:no Eye drainage/crusting: no  Nausea: no  Vomiting: no Diarrhea: no  Change in appetite: decreased since Friday   Loss of taste/smell: nothing tastes good  Rash: no Fatigue: yes Sick contacts: no Strep contacts: no  Context: better Recurrent sinusitis: no Treatments attempted: breathing exercises, [place]  Relief with OTC medications: yes somewhat  No Known Allergies  Outpatient Encounter Medications as of 12/09/2020  Medication Sig Note  . atorvastatin (LIPITOR) 40 MG tablet TAKE 1 TABLET (40 MG TOTAL) BY MOUTH DAILY. PLEASE STOP PRAVASTATIN   . cetirizine (ZYRTEC) 10 MG tablet Take 1 tablet (10 mg total) by mouth daily.   Marland Kitchen escitalopram (LEXAPRO) 10 MG tablet Take 1 tablet (10 mg total) by mouth daily.   . ferrous sulfate 324 (65 Fe) MG TBEC TAKE 1 TABLET BY MOUTH EVERY DAY   . fluticasone (FLONASE) 50 MCG/ACT nasal spray Place 2 sprays into both nostrils daily.   Marland Kitchen guaiFENesin (MUCINEX) 600 MG 12 hr tablet Take 1 tablet (600 mg total) by mouth 2 (two) times daily as  needed for cough or to loosen phlegm.   Marland Kitchen LORazepam (ATIVAN) 0.5 MG tablet Take 1 tablet (0.5 mg total) by mouth daily as needed for anxiety.   . metoprolol tartrate (LOPRESSOR) 25 MG tablet Take 0.5 tablets (12.5 mg total) by mouth 2 (two) times daily.   . multivitamin (THERAGRAN) per tablet Take 1 tablet by mouth daily.   Marland Kitchen omeprazole (PRILOSEC) 20 MG capsule Take 20 mg by mouth as needed (heartburn).   . pantoprazole (PROTONIX) 40 MG tablet Take 1 tablet (40 mg total) by mouth daily.   Marland Kitchen PRADAXA 150 MG CAPS capsule TAKE 1 CAPSULE BY MOUTH TWICE A DAY 10/11/2020: Last dose 10-06-20 for EGD/Colonoscopy  . pravastatin (PRAVACHOL) 40 MG tablet Take 40 mg by mouth at bedtime.    . sucralfate (CARAFATE) 1 g tablet TAKE 1 TABLET BY MOUTH TWICE A DAY   . Tamsulosin HCl (FLOMAX) 0.4 MG CAPS Take 0.4 mg by mouth daily.   . valsartan (DIOVAN) 320 MG tablet Take 0.5 tablets (160 mg total) by mouth daily.   . vitamin B-12 (CYANOCOBALAMIN) 1000 MCG tablet Take 1,000 mcg by mouth daily.   . Vitamin D3 (VITAMIN D) 25 MCG tablet Take 1,000 Units by mouth daily.    No facility-administered encounter medications on file as of 12/09/2020.    Patient Active Problem List   Diagnosis Date Noted  . Melanoma ([place])   . Atrial fibrillation ([place])   . Malignant neoplasm of prostate ([place]) 07/15/2015  .  Recurrent nephrolithiasis 07/15/2015  . Personal history of prostate cancer 04/25/2015  . 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 Coronary artery disease 11/02/2011  . Urethral stricture 06/22/2011  . Hyperlipidemia   . Borderline diabetic   . Hypertension     Past Medical History:  Diagnosis Date  . Anal fissure   . Arthritis   . Atrial fibrillation ([place])   . Back pain   . Borderline diabetic   . Colon polyps   . Dysrhythmia    a-fib  . GERD (gastroesophageal reflux disease)   . History of echocardiogram 07/ 07/ 2011  . History of lithotripsy 1989  .  Hyperlipidemia   . Hypertension   . Kidney stones   . Melanoma ([place]) 05/01/2019   right chest wall (12/20)  . Pre-diabetes   . Prostate CA ([place]) 2010  . Sleep apnea    uses CPAP nightly  . Squamous cell carcinoma of skin 05/23/1992   bowens-left parietal scalp (CX35FU)  . Squamous cell carcinoma of skin 03/14/2008   in situ-left upper outer forehead-medial (CX35FU)  . Squamous cell carcinoma of skin 03/14/2008   in situ-crown of scalp (Cx35FU)  . Squamous cell carcinoma of skin 04/15/2011   in situ-right dorsal forearm (txpbx)  . Squamous cell carcinoma of skin 10/16/2011   in situ-left sideburn  . Squamous cell carcinoma of skin 03/11/2015   ka-left sideburn (CX35FU)  . Squamous cell carcinoma of skin 03/11/2015   ka-left forearm (CX35FU)  . Squamous cell carcinoma of skin 06/22/2018   in situ-left forearm, sup (txpbx)  . Squamous cell carcinoma of skin 05/01/2019   in situ-mid anterior scalp   . Squamous cell carcinoma of skin 05/01/2019   in situ-right upper arm    Relevant past medical, surgical, family and social history reviewed and updated as indicated. Interim medical history since our last visit reviewed.  Review of Systems Per HPI unless specifically indicated above     Objective:    There were no vitals taken for this visit.  Wt Readings from Last 3 Encounters:  10/17/20 175 lb (79.4 kg)  10/11/20 191 lb (86.6 kg)  09/03/20 188 lb (85.3 kg)    Physical Exam  Physical examination unable to be performed due to lack of equipment.     Assessment & Plan:  1. Upper respiratory tract infection, unspecified type Acute.  Unclear etiology - likely viral vs. Allergic rhinitis secondary to cutting grass.  Reassured patient that symptoms and exam findings are most consistent with a viral upper respiratory infection and explained lack of efficacy of antibiotics against viruses.  Discussed expected course and features suggestive of secondary bacterial infection.   Continue supportive care. Increase fluid intake with water or electrolyte solution like pedialyte. Encouraged acetaminophen as needed for fever/pain. Encouraged salt water gargling, chloraseptic spray and throat lozenges. Encouraged OTC guaifenesin. Encouraged saline sinus flushes and/or neti with humidified air.  With no improvement in symptoms near end of week, return to clinic.  - guaiFENesin (MUCINEX) 600 MG 12 hr tablet; Take 1 tablet (600 mg total) by mouth 2 (two) times daily as needed for cough or to loosen phlegm.  Dispense: 30 tablet; Refill: 0  2. Seasonal allergic rhinitis, unspecified trigger Acute.  Will start on allergy medicine and monitor for improvement.  Return to symptoms with no improvement near end of week.  - cetirizine (ZYRTEC) 10 MG tablet; Take 1 tablet (10 mg total) by mouth daily.  Dispense: 30  tablet; Refill: 11 - fluticasone (FLONASE) 50 MCG/ACT nasal spray; Place 2 sprays into both nostrils daily.  Dispense: 16 g; Refill: 6     Follow up plan: Return if symptoms worsen or fail to improve.  This visit was completed via telephone due to the restrictions of the COVID-19 pandemic. All issues as above were discussed and addressed but no physical exam was performed. If it was felt that the patient should be evaluated in the office, they were directed there. The patient verbally consented to this visit. Patient was unable to complete an audio/visual visit due to Lack of equipment. . Location of the patient: home . Location of the provider: work . Those involved with this call:  . Provider: Noemi Chapel, DNP, FNP-C . CMA: Annabelle Harman, CMA . Front Desk/Registration: n/a  . Time spent on call: 11 minutes on the phone discussing health concerns. 15 minutes total spent in review of patient's record and preparation of their chart.  I verified patient identity using two factors (patient name and date of birth). Patient consents verbally to being seen via telemedicine  visit today.

## 2020-12-11 ENCOUNTER — Telehealth: Payer: Self-pay

## 2020-12-11 DIAGNOSIS — J069 Acute upper respiratory infection, unspecified: Secondary | ICD-10-CM

## 2020-12-12 MED ORDER — AMOXICILLIN-POT CLAVULANATE 875-125 MG PO TABS: 1.0000 | ORAL_TABLET | Freq: Two times a day (BID) | ORAL | 0 refills | Status: DC

## 2020-12-12 NOTE — Telephone Encounter (Signed)
Spoke with ot, going to pick up med. Will go for imaging if not feeling better.

## 2020-12-12 NOTE — Telephone Encounter (Signed)
Left message for return call.

## 2020-12-12 NOTE — Telephone Encounter (Signed)
Received call from patient.   Reports that he continues to have cough, hoarseness, and increased congestion in chest. States that he has been taking medications as prescribed with no relief.   States that Sx have now been present >7 days and is requesting ABTx.   Please advise.

## 2020-12-12 NOTE — Telephone Encounter (Signed)
Antibiotics have been sent into pharmacy.  Please encourage patient to get chest x-ray since symptoms are not improving.  I have ordered already.

## 2020-12-13 ENCOUNTER — Ambulatory Visit: Payer: Medicare Other | Admitting: Cardiology

## 2020-12-13 ENCOUNTER — Other Ambulatory Visit: Payer: Self-pay

## 2020-12-13 ENCOUNTER — Ambulatory Visit
Admission: RE | Admit: 2020-12-13 | Discharge: 2020-12-13 | Disposition: A | Discharge status: Home / Self Care | Payer: Medicare Other | Source: Ambulatory Visit | Attending: Nurse Practitioner | Admitting: Nurse Practitioner

## 2020-12-13 DIAGNOSIS — J069 Acute upper respiratory infection, unspecified: Secondary | ICD-10-CM

## 2020-12-16 ENCOUNTER — Other Ambulatory Visit: Payer: Self-pay | Admitting: Nurse Practitioner

## 2020-12-16 DIAGNOSIS — J189 Pneumonia, unspecified organism: Secondary | ICD-10-CM

## 2020-12-16 MED ORDER — DOXYCYCLINE HYCLATE 100 MG PO TABS: 100.0000 mg | ORAL_TABLET | Freq: Two times a day (BID) | ORAL | 0 refills | Status: AC

## 2020-12-17 ENCOUNTER — Other Ambulatory Visit: Payer: Self-pay

## 2020-12-17 ENCOUNTER — Ambulatory Visit (INDEPENDENT_AMBULATORY_CARE_PROVIDER_SITE_OTHER): Payer: 59 | Admitting: Family Medicine

## 2020-12-17 ENCOUNTER — Encounter: Payer: Self-pay | Admitting: Family Medicine

## 2020-12-17 ENCOUNTER — Telehealth: Payer: Self-pay | Admitting: Family Medicine

## 2020-12-17 VITALS — BP 126/86 | HR 75 | Temp 97.2°F | Ht 67.0 in | Wt 177.8 lb

## 2020-12-17 DIAGNOSIS — I10 Essential (primary) hypertension: Secondary | ICD-10-CM | POA: Diagnosis not present

## 2020-12-17 DIAGNOSIS — E7800 Pure hypercholesterolemia, unspecified: Secondary | ICD-10-CM

## 2020-12-17 DIAGNOSIS — I4819 Other persistent atrial fibrillation: Secondary | ICD-10-CM | POA: Diagnosis not present

## 2020-12-17 DIAGNOSIS — E1169 Type 2 diabetes mellitus with other specified complication: Secondary | ICD-10-CM | POA: Diagnosis not present

## 2020-12-17 NOTE — Telephone Encounter (Signed)
This patient showed a $30 bad debt balance for a visit in March, 2022 with Noemi Chapel. When I collected the payment and went to distribute it, I wasn't able to apply it to that visit. Please advise.

## 2020-12-17 NOTE — Progress Notes (Signed)
Subjective:    Patient ID: Antonio Clark, male    DOB: 22-Jul-1936, 85 y.o.   MRN: [id number]  HPI  11/21 Patient has recently undergone cardioversion due to atrial fibrillation.  He is now in normal sinus rhythm however he is still taking Pradaxa.  Once he started Pradaxa he saw precipitous drop in his hemoglobin to 10.  He was found to be iron deficient.  He has been on an iron supplement and his hemoglobin has risen steadily to 12.3 and most recently to 12.7.  However 2 out of 3 stool cards have shown blood.  He does report occasional epigastric abdominal pain.  He had a colonoscopy 3 years ago that showed polyps but no malignancy.  He does have diarrhea frequently immediately upon eating.  He denies any fevers or chills.  However he continues to feel weak and tired.  Recently his renal function deteriorated as his creatinine went from 1.3-1.8.  However his cardiologist reduced his dose of valsartan and also discontinued his hydrochlorothiazide.  This raised his blood pressure and he feels much better now.  His strength is improving.  He is due to recheck his renal function.  They made these changes in his medication 1 week ago.  At that time, my plan was: Given his epigastric abdominal pain, I suspect the likely has some mild gastritis aggravated by Pradaxa.  Therefore I recommended that he start taking omeprazole every day.  I will consult GI for an EGD and a colonoscopy depending upon the recommendation particular given his fatigue.  His hemoglobin seems stable.  We will need to monitor closely given the fact that he has had blood in his stool and his hemoglobin has only recently begun to rise.  Repeat a BMP to see if his renal function has improved since decreasing valsartan and discontinuing hydrochlorothiazide   12/17/20 Patient is due today for recheck.  He has not been checking his blood sugar.  However he denies any polyuria, polydipsia, or blurry vision.  Today he is in normal sinus rhythm  however he is still taking his blood thinner for primary prevention of stroke.  He is due to recheck his hemoglobin.  It seems that the drop in his hemoglobin was due to gastritis found on EGD.  We want to make sure that this is stable.  He denies any strokelike symptoms.  He denies any chest pain shortness of breath or dyspnea on exertion.  He recently had a cough that was treated with antibiotics by one of my partners however he states the cough has improved now dramatically and is getting better. Past Medical History:  Diagnosis Date  . Anal fissure   . Arthritis   . Atrial fibrillation ([place])   . Back pain   . Borderline diabetic   . Colon polyps   . Dysrhythmia    a-fib  . GERD (gastroesophageal reflux disease)   . History of echocardiogram 07/ 07/ 2011  . History of lithotripsy 1989  . Hyperlipidemia   . Hypertension   . Kidney stones   . Melanoma ([place]) 05/01/2019   right chest wall (12/20)  . Pre-diabetes   . Prostate CA ([place]) 2010  . Sleep apnea    uses CPAP nightly  . Squamous cell carcinoma of skin 05/23/1992   bowens-left parietal scalp (CX35FU)  . Squamous cell carcinoma of skin 03/14/2008   in situ-left upper outer forehead-medial (CX35FU)  . Squamous cell carcinoma of skin 03/14/2008   in situ-crown of scalp (  Cx35FU)  . Squamous cell carcinoma of skin 04/15/2011   in situ-right dorsal forearm (txpbx)  . Squamous cell carcinoma of skin 10/16/2011   in situ-left sideburn  . Squamous cell carcinoma of skin 03/11/2015   ka-left sideburn (CX35FU)  . Squamous cell carcinoma of skin 03/11/2015   ka-left forearm (CX35FU)  . Squamous cell carcinoma of skin 06/22/2018   in situ-left forearm, sup (txpbx)  . Squamous cell carcinoma of skin 05/01/2019   in situ-mid anterior scalp   . Squamous cell carcinoma of skin 05/01/2019   in situ-right upper arm   Past Surgical History:  Procedure Laterality Date  . ATRIAL FIBRILLATION ABLATION N/A 04/17/2020   Procedure: ATRIAL  FIBRILLATION ABLATION;  Surgeon: Vickie Epley, MD;  Location: [place] [facility];  Service: Cardiovascular;  Laterality: N/A;  . AXILLARY SENTINEL NODE BIOPSY Right 08/23/2019   Procedure: SENTINEL LYMPH NODE BIOSPY RIGHT AXILLA;  Surgeon: Stark Klein, MD;  Location: [place];  Service: General;  Laterality: Right;  . CARDIOVERSION N/A 05/10/2020   Procedure: CARDIOVERSION;  Surgeon: Freada Bergeron, MD;  Location: [facility];  Service: Cardiovascular;  Laterality: N/A;  . COLONOSCOPY  2002  . FASCIECTOMY Left 03/28/2019   Procedure: SEGMENTAL FASCIECTOMY LEFT RING FINGER;  Surgeon: Daryll Brod, MD;  Location: [place];  Service: Orthopedics;  Laterality: Left;  ANESTHESIA  AXILLARY BLOCK  . INSERTION PROSTATE RADIATION SEED  8 4 2010   per Dr Rosana Hoes  . MELANOMA EXCISION Right 06/22/2019   Procedure: WIDE LOCAL EXCISION RIGHT CHEST WALL MELANOMA, ADVANCEMENT FLAP CLOSURE FOR DEFECT 3X6 CM;  Surgeon: Stark Klein, MD;  Location: [place];  Service: General;  Laterality: Right;  . POLYPECTOMY  2002  . THROAT SURGERY  1980   benign cyst  . UPPER GASTROINTESTINAL ENDOSCOPY    . URETHRAL STRICTURE DILATATION     also penile implant   Current Outpatient Medications on File Prior to Visit  Medication Sig Dispense Refill  . atorvastatin (LIPITOR) 40 MG tablet TAKE 1 TABLET (40 MG TOTAL) BY MOUTH DAILY. PLEASE STOP PRAVASTATIN 90 tablet 3  . cetirizine (ZYRTEC) 10 MG tablet Take 1 tablet (10 mg total) by mouth daily. 30 tablet 11  . doxycycline (VIBRA-TABS) 100 MG tablet Take 1 tablet (100 mg total) by mouth 2 (two) times daily for 5 days. 10 tablet 0  . escitalopram (LEXAPRO) 10 MG tablet Take 1 tablet (10 mg total) by mouth daily. 90 tablet 1  . ferrous sulfate 324 (65 Fe) MG TBEC TAKE 1 TABLET BY MOUTH EVERY DAY 90 tablet 1  . fluticasone (FLONASE) 50 MCG/ACT nasal spray Place 2 sprays into both nostrils daily. 16 g 6  . guaiFENesin  (MUCINEX) 600 MG 12 hr tablet Take 1 tablet (600 mg total) by mouth 2 (two) times daily as needed for cough or to loosen phlegm. 30 tablet 0  . LORazepam (ATIVAN) 0.5 MG tablet Take 1 tablet (0.5 mg total) by mouth daily as needed for anxiety. 30 tablet 1  . metoprolol tartrate (LOPRESSOR) 25 MG tablet Take 0.5 tablets (12.5 mg total) by mouth 2 (two) times daily. 60 tablet 11  . multivitamin (THERAGRAN) per tablet Take 1 tablet by mouth daily.    Marland Kitchen omeprazole (PRILOSEC) 20 MG capsule Take 20 mg by mouth as needed (heartburn).    . pantoprazole (PROTONIX) 40 MG tablet Take 1 tablet (40 mg total) by mouth daily. 90 tablet 0  . PRADAXA 150 MG CAPS  capsule TAKE 1 CAPSULE BY MOUTH TWICE A DAY 180 capsule 1  . pravastatin (PRAVACHOL) 40 MG tablet Take 40 mg by mouth at bedtime.     . sucralfate (CARAFATE) 1 g tablet TAKE 1 TABLET BY MOUTH TWICE A DAY 60 tablet 0  . Tamsulosin HCl (FLOMAX) 0.4 MG CAPS Take 0.4 mg by mouth daily.    . valsartan (DIOVAN) 320 MG tablet Take 0.5 tablets (160 mg total) by mouth daily. 90 tablet 3  . vitamin B-12 (CYANOCOBALAMIN) 1000 MCG tablet Take 1,000 mcg by mouth daily.    . Vitamin D3 (VITAMIN D) 25 MCG tablet Take 1,000 Units by mouth daily.     No current facility-administered medications on file prior to visit.   No Known Allergies Social History   Socioeconomic History  . Marital status: Single    Spouse name: Not on file  . Number of children: 0  . Years of education: Not on file  . Highest education level: Not on file  Occupational History  . Occupation: retired  Tobacco Use  . Smoking status: Never Smoker  . Smokeless tobacco: Never Used  Vaping Use  . Vaping Use: Never used  Substance and Sexual Activity  . Alcohol use: Not Currently    Comment: social  . Drug use: No  . Sexual activity: Yes  Other Topics Concern  . Not on file  Social History Narrative  . Not on file   Social Determinants of Health   Financial Resource Strain: Low Risk    . Difficulty of Paying Living Expenses: Not very hard  Food Insecurity: Not on file  Transportation Needs: Not on file  Physical Activity: Not on file  Stress: Not on file  Social Connections: Not on file  Intimate Partner Violence: Not on file   Family History  Problem Relation Age of Onset  . Coronary artery disease Brother        3 brothers had CABG  . Arrhythmia Brother   . Diabetes Brother   . Hearing loss Brother   . Hypertension Brother   . Heart disease Brother   . Heart failure Brother   . Hypertension Brother   . Heart disease Father   . Hypertension Father   . Sudden death Father   . Stroke Mother        cerebral hemorrage  . Colon cancer Mother 54       small intestine  . Depression Mother   . Stroke Sister   . Suicidality Sister   . Diabetes Brother   . Hypertension Brother   . Hyperlipidemia Sister   . Esophageal cancer Neg Hx   . Pancreatic cancer Neg Hx   . Prostate cancer Neg Hx   . Rectal cancer Neg Hx   . Stomach cancer Neg Hx      Review of Systems  Respiratory: Positive for cough.   All other systems reviewed and are negative.      Objective:   Physical Exam Vitals reviewed.  Constitutional:      General: He is not in acute distress.    Appearance: He is well-developed. He is not diaphoretic.  HENT:     Head: Normocephalic and atraumatic.     Right Ear: External ear normal.     Left Ear: External ear normal.     Nose: Nose normal.     Mouth/Throat:     Pharynx: No oropharyngeal exudate.  Eyes:     General: No scleral icterus.  Right eye: No discharge.        Left eye: No discharge.     Conjunctiva/sclera: Conjunctivae normal.     Pupils: Pupils are equal, round, and reactive to light.  Neck:     Thyroid: No thyromegaly.     Vascular: No JVD.     Trachea: No tracheal deviation.  Cardiovascular:     Rate and Rhythm: Normal rate and regular rhythm.     Heart sounds: Normal heart sounds. No murmur heard. No friction rub.  No gallop.   Pulmonary:     Effort: Pulmonary effort is normal. No respiratory distress.     Breath sounds: Normal breath sounds. No stridor. No wheezing or rales.  Chest:     Chest wall: No tenderness.  Abdominal:     General: Bowel sounds are normal. There is no distension.     Palpations: Abdomen is soft. There is no mass.     Tenderness: There is no abdominal tenderness. There is no guarding or rebound.  Musculoskeletal:        General: No tenderness. Normal range of motion.     Cervical back: Normal range of motion and neck supple.  Lymphadenopathy:     Cervical: No cervical adenopathy.  Skin:    General: Skin is warm.     Coloration: Skin is not pale.     Findings: No erythema or rash.  Neurological:     Mental Status: He is alert and oriented to person, place, and time.     Cranial Nerves: No cranial nerve deficit.     Motor: No abnormal muscle tone.     Coordination: Coordination normal.     Deep Tendon Reflexes: Reflexes are normal and symmetric.  Psychiatric:        Behavior: Behavior normal.        Thought Content: Thought content normal.        Judgment: Judgment normal.        Assessment & Plan:  Persistent atrial fibrillation (HCC)  Primary hypertension  Pure hypercholesterolemia  Type 2 diabetes mellitus with other specified complication, without long-term current use of insulin ([place]) - Plan: Hemoglobin A1c, CBC with Differential/Platelet, COMPLETE METABOLIC PANEL WITH GFR, Lipid panel, Microalbumin, urine  Blood pressure today is acceptable.  Check a CMP and a CBC to monitor hemoglobin and renal function.  Check an A1c.  Goal A1c is less than 7.  Check a urine microalbumin.  Goal albumin to creatinine ratio is less than 30.  Check an LDL cholesterol.  Goal LDL cholesterol is less than 100.  Recommended that he start checking his sugars more regularly so that we can achieve better glycemic control.

## 2020-12-17 NOTE — Progress Notes (Signed)
[id number]

## 2020-12-19 ENCOUNTER — Encounter: Payer: Self-pay | Admitting: *Deleted

## 2020-12-19 LAB — CBC WITH DIFFERENTIAL/PLATELET
Absolute Monocytes: 646 cells/uL (ref 200–950)
Basophils Absolute: 61 cells/uL (ref 0–200)
Basophils Relative: 0.6 %
Eosinophils Absolute: 222 cells/uL (ref 15–500)
Eosinophils Relative: 2.2 %
HCT: 40.5 % (ref 38.5–50.0)
Hemoglobin: 13.2 g/dL (ref 13.2–17.1)
Lymphs Abs: 1778 cells/uL (ref 850–3900)
MCH: 28.3 pg (ref 27.0–33.0)
MCHC: 32.6 g/dL (ref 32.0–36.0)
MCV: 86.9 fL (ref 80.0–100.0)
MPV: 9.2 fL (ref 7.5–12.5)
Monocytes Relative: 6.4 %
Neutro Abs: 7393 cells/uL (ref 1500–7800)
Neutrophils Relative %: 73.2 %
Platelets: 237 10*3/uL (ref 140–400)
RBC: 4.66 10*6/uL (ref 4.20–5.80)
RDW: 12.8 % (ref 11.0–15.0)
Total Lymphocyte: 17.6 %
WBC: 10.1 10*3/uL (ref 3.8–10.8)

## 2020-12-19 LAB — LIPID PANEL
Cholesterol: 112 mg/dL (ref <200)
HDL: 37 mg/dL — ABNORMAL LOW (ref >=40)
LDL Cholesterol (Calc): 51 mg/dL (calc)
Non-HDL Cholesterol (Calc): 75 mg/dL (calc) (ref <130)
Total CHOL/HDL Ratio: 3 (calc) (ref <5.0)
Triglycerides: 158 mg/dL — ABNORMAL HIGH (ref <150)

## 2020-12-19 LAB — COMPLETE METABOLIC PANEL WITH GFR
AG Ratio: 1.4 (calc) (ref 1.0–2.5)
ALT: 6 U/L — ABNORMAL LOW (ref 9–46)
AST: 14 U/L (ref 10–35)
Albumin: 3.9 g/dL (ref 3.6–5.1)
Alkaline phosphatase (APISO): 74 U/L (ref 35–144)
BUN/Creatinine Ratio: 11 (calc) (ref 6–22)
BUN: 14 mg/dL (ref 7–25)
CO2: 25 mmol/L (ref 20–32)
Calcium: 9.7 mg/dL (ref 8.6–10.3)
Chloride: 101 mmol/L (ref 98–110)
Creat: 1.26 mg/dL — ABNORMAL HIGH (ref 0.70–1.11)
GFR, Est African American: 61 mL/min/{1.73_m2} (ref >=60)
GFR, Est Non African American: 53 mL/min/{1.73_m2} — ABNORMAL LOW (ref >=60)
Globulin: 2.7 g/dL (calc) (ref 1.9–3.7)
Glucose, Bld: 187 mg/dL — ABNORMAL HIGH (ref 65–99)
Potassium: 3.6 mmol/L (ref 3.5–5.3)
Sodium: 139 mmol/L (ref 135–146)
Total Bilirubin: 0.4 mg/dL (ref 0.2–1.2)
Total Protein: 6.6 g/dL (ref 6.1–8.1)

## 2020-12-19 LAB — HEMOGLOBIN A1C
Hgb A1c MFr Bld: 6.7 % of total Hgb — ABNORMAL HIGH (ref <5.7)
Mean Plasma Glucose: 146 mg/dL
eAG (mmol/L): 8.1 mmol/L

## 2020-12-19 LAB — MICROALBUMIN, URINE: Microalb, Ur: 8.8 mg/dL

## 2020-12-23 NOTE — Telephone Encounter (Signed)
Thank you :)

## 2020-12-30 ENCOUNTER — Telehealth: Payer: Self-pay | Admitting: Pharmacist

## 2020-12-30 NOTE — Progress Notes (Addendum)
Chronic Care Management Pharmacy Assistant   Name: Antonio Clark  MRN: [id number] DOB: [birth year]  Reason for Encounter: Disease State For HTN.   Conditions to be addressed/monitored: HTN, CAD, Afib, HLD, borderline DM.  Recent office visits:  12/17/20 Dr. Dennard Schaumann For follow-up. No medication changes.  12/11/20 (Telephone) Eulogio Bear, NP.For upper respiratory tract infection. STARTED Amoxicillin-Pot-Clavulanate 875-125 mg 1 tablet 2 times daily.  12/09/20 Eulogio Bear, NP.For upper respiratory tract infection. STARTED Cetirizine 10 mg daily, Fluticasone Propionate 50 mcg/act 2 sprays each nare daily, Guaifenesin 600 mg 2 times daily PRN.  10/17/20 Eulogio Bear, NP. For anxiety and depression. STARTED Escitalopram Oxalate 10 mg daily and Lorazepam 0.5 mg daily PRN.  07/08/20 Dr. Dennard Schaumann For anemia/blood in stool. No medication changes.   Recent consult visits:  11/14/20 Urology Terlecki, Huey Bienenstock, MD No medication changes.   09/03/20 Cardiology Burtis Junes, NP. For persistent atrial fibrillation. No medication changes.  08/20/19 Willa Frater, MD. For Iron deficiency anemia. STARTED NA Sulfate-K Sulfate-Mg Sulf 17.5-3.13-1.6 GM/177ML. STOPPED Omega 3. 07/17/20 Cardiology Lars Mage T, MD.For persistent atrial fibrillation. STOPPED Diphenhydramine-Apap.  07/02/20 Cardiology Burtis Junes, NP. For persistent atrial fibrillation. STOPPED Hydrochlorothiazide. DECREASED Valsartan to 160 mg daily.  06/28/20 Urology Terlecki, Huey Bienenstock, MD No medication changes.   Hospital visits:  None in previous 6 months  Medications: Outpatient Encounter Medications as of 12/30/2020  Medication Sig Note   atorvastatin (LIPITOR) 40 MG tablet TAKE 1 TABLET (40 MG TOTAL) BY MOUTH DAILY. PLEASE STOP PRAVASTATIN    cetirizine (ZYRTEC) 10 MG tablet Take 1 tablet (10 mg total) by mouth daily.    escitalopram (LEXAPRO) 10 MG tablet Take 1 tablet (10 mg total)  by mouth daily.    ferrous sulfate 324 (65 Fe) MG TBEC TAKE 1 TABLET BY MOUTH EVERY DAY    fluticasone (FLONASE) 50 MCG/ACT nasal spray Place 2 sprays into both nostrils daily.    guaiFENesin (MUCINEX) 600 MG 12 hr tablet Take 1 tablet (600 mg total) by mouth 2 (two) times daily as needed for cough or to loosen phlegm.    LORazepam (ATIVAN) 0.5 MG tablet Take 1 tablet (0.5 mg total) by mouth daily as needed for anxiety.    metoprolol tartrate (LOPRESSOR) 25 MG tablet Take 0.5 tablets (12.5 mg total) by mouth 2 (two) times daily.    multivitamin (THERAGRAN) per tablet Take 1 tablet by mouth daily.    omeprazole (PRILOSEC) 20 MG capsule Take 20 mg by mouth as needed (heartburn).    pantoprazole (PROTONIX) 40 MG tablet Take 1 tablet (40 mg total) by mouth daily.    PRADAXA 150 MG CAPS capsule TAKE 1 CAPSULE BY MOUTH TWICE A DAY 10/11/2020: Last dose 10-06-20 for EGD/Colonoscopy   pravastatin (PRAVACHOL) 40 MG tablet Take 40 mg by mouth at bedtime.     sucralfate (CARAFATE) 1 g tablet TAKE 1 TABLET BY MOUTH TWICE A DAY    Tamsulosin HCl (FLOMAX) 0.4 MG CAPS Take 0.4 mg by mouth daily.    valsartan (DIOVAN) 320 MG tablet Take 0.5 tablets (160 mg total) by mouth daily.    vitamin B-12 (CYANOCOBALAMIN) 1000 MCG tablet Take 1,000 mcg by mouth daily.    Vitamin D3 (VITAMIN D) 25 MCG tablet Take 1,000 Units by mouth daily.    No facility-administered encounter medications on file as of 12/30/2020.   Reviewed chart prior to disease state call. Spoke with patient regarding BP  Recent Office Vitals: BP  Readings from Last 3 Encounters:  12/17/20 126/86  10/17/20 116/84  10/11/20 (!) 118/35   Pulse Readings from Last 3 Encounters:  12/17/20 75  10/17/20 82  10/11/20 68    Wt Readings from Last 3 Encounters:  12/17/20 177 lb 12.8 oz (80.6 kg)  10/17/20 175 lb (79.4 kg)  10/11/20 191 lb (86.6 kg)     Kidney Function Lab Results  Component Value Date/Time   CREATININE 1.26 (H) 12/17/2020 08:27 AM    CREATININE 1.24 (H) 07/08/2020 08:42 AM   GFR 78.21 08/21/2013 12:19 PM   GFRNONAA 53 (L) 12/17/2020 08:27 AM   GFRAA 61 12/17/2020 08:27 AM    BMP Latest Ref Rng & Units 12/17/2020 07/08/2020 07/02/2020  Glucose 65 - 99 mg/dL 187(H) 168(H) 162(H)  BUN 7 - 25 mg/dL 14 21 22   Creatinine 0.70 - 1.11 mg/dL 1.26(H) 1.24(H) 1.82(H)  BUN/Creat Ratio 6 - 22 (calc) 11 17 12   Sodium 135 - 146 mmol/L 139 140 142  Potassium 3.5 - 5.3 mmol/L 3.6 4.6 4.9  Chloride 98 - 110 mmol/L 101 108 103  CO2 20 - 32 mmol/L 25 25 23   Calcium 8.6 - 10.3 mg/dL 9.7 9.5 9.5    Current antihypertensive regimen:  Valsartan 320 mg 0.5 daily  How often are you checking your Blood Pressure? Patient stated twice daily   Current home BP readings: Patient stated he writes them down but cant remember what the most recent one was.   What recent interventions/DTPs have been made by any provider to improve Blood Pressure control since last CPP Visit: None  Any recent hospitalizations or ED visits since last visit with CPP? Patient stated no.  What diet changes have been made to improve Blood Pressure Control?  Patient stated he doesn't eat any pork, or bread. He stated he makes sure he eats vegetables and fruits.   What exercise is being done to improve your Blood Pressure Control?  Patient stated he stared walking at the [facility] recently for about 1.5 miles on the treadmill.   Adherence Review: Is the patient currently on ACE/ARB medication? Valsartan 320 mg   Does the patient have >5 day gap between last estimated fill dates? Per misc rpts, no.  Star Rating Drugs: Atorvastatin 40 mg 90 DS 12/05/20 Valsartan 320 mg 90 DS 10/05/20  Follow-Up:Pharmacist Review   Charlann Lange, RMA Clinical Pharmacist Assistant [phone]  10 minutes spent in review, coordination, and documentation.  Reviewed by: Beverly Milch, PharmD Clinical Pharmacist [facility] [phone]

## 2020-12-31 ENCOUNTER — Ambulatory Visit: Payer: Medicare Other | Admitting: Cardiology

## 2021-01-01 ENCOUNTER — Other Ambulatory Visit: Payer: Self-pay | Admitting: Gastroenterology

## 2021-01-01 DIAGNOSIS — D509 Iron deficiency anemia, unspecified: Secondary | ICD-10-CM

## 2021-01-01 DIAGNOSIS — K2970 Gastritis, unspecified, without bleeding: Secondary | ICD-10-CM

## 2021-01-05 ENCOUNTER — Other Ambulatory Visit: Payer: Self-pay | Admitting: Cardiology

## 2021-01-07 NOTE — Telephone Encounter (Signed)
Pt's age 85, wt 80.6 kg, SCr 1.26, CrCl 43.8, last ov w/ LG 08/29/20.

## 2021-01-16 ENCOUNTER — Ambulatory Visit: Payer: Medicare Other | Admitting: Cardiology

## 2021-01-31 ENCOUNTER — Ambulatory Visit: Payer: Medicare Other | Admitting: Cardiology

## 2021-01-31 ENCOUNTER — Other Ambulatory Visit: Payer: Self-pay

## 2021-01-31 ENCOUNTER — Encounter: Payer: Self-pay | Admitting: Cardiology

## 2021-01-31 ENCOUNTER — Other Ambulatory Visit: Payer: Self-pay | Admitting: Gastroenterology

## 2021-01-31 VITALS — BP 132/74 | HR 63 | Ht 69.0 in | Wt 185.0 lb

## 2021-01-31 DIAGNOSIS — I4892 Unspecified atrial flutter: Secondary | ICD-10-CM | POA: Diagnosis not present

## 2021-01-31 DIAGNOSIS — D509 Iron deficiency anemia, unspecified: Secondary | ICD-10-CM

## 2021-01-31 DIAGNOSIS — I4819 Other persistent atrial fibrillation: Secondary | ICD-10-CM | POA: Diagnosis not present

## 2021-01-31 DIAGNOSIS — I10 Essential (primary) hypertension: Secondary | ICD-10-CM

## 2021-01-31 DIAGNOSIS — K2970 Gastritis, unspecified, without bleeding: Secondary | ICD-10-CM

## 2021-01-31 NOTE — Progress Notes (Signed)
Electrophysiology Office Follow up Visit Note:    Date:  01/31/2021   ID:  Antonio Clark, DOB 1936-05-03, MRN [id number]  PCP:  Susy Frizzle, MD  [facility] Cardiologist:  Candee Furbish, MD  [facility] Electrophysiologist:  Vickie Epley, MD    Interval History:    Antonio Clark is a 85 y.o. male who presents for a follow up visit.  He underwent a successful atrial fibrillation ablation on April 17, 2020.  He has done well since the ablation without recurrence of arrhythmia.  He is maintained on Pradaxa for stroke prophylaxis and has not had any bleeding issues on this regimen.    Past Medical History:  Diagnosis Date   Anal fissure    Arthritis    Atrial fibrillation (HCC)    Back pain    Borderline diabetic    Colon polyps    Dysrhythmia    a-fib   GERD (gastroesophageal reflux disease)    History of echocardiogram 07/ 07/ 2011   History of lithotripsy 1989   Hyperlipidemia    Hypertension    Kidney stones    Melanoma ([place]) 05/01/2019   right chest wall (12/20)   Pre-diabetes    Prostate CA ([place]) 2010   Sleep apnea    uses CPAP nightly   Squamous cell carcinoma of skin 05/23/1992   bowens-left parietal scalp (CX35FU)   Squamous cell carcinoma of skin 03/14/2008   in situ-left upper outer forehead-medial (CX35FU)   Squamous cell carcinoma of skin 03/14/2008   in situ-crown of scalp (Cx35FU)   Squamous cell carcinoma of skin 04/15/2011   in situ-right dorsal forearm (txpbx)   Squamous cell carcinoma of skin 10/16/2011   in situ-left sideburn   Squamous cell carcinoma of skin 03/11/2015   ka-left sideburn (CX35FU)   Squamous cell carcinoma of skin 03/11/2015   ka-left forearm (CX35FU)   Squamous cell carcinoma of skin 06/22/2018   in situ-left forearm, sup (txpbx)   Squamous cell carcinoma of skin 05/01/2019   in situ-mid anterior scalp    Squamous cell carcinoma of skin 05/01/2019   in situ-right upper arm    Past Surgical History:   Procedure Laterality Date   ATRIAL FIBRILLATION ABLATION N/A 04/17/2020   Procedure: [place];  Surgeon: Vickie Epley, MD;  Location: [place] [facility];  Service: Cardiovascular;  Laterality: N/A;   AXILLARY SENTINEL NODE BIOPSY Right 08/23/2019   Procedure: SENTINEL LYMPH NODE BIOSPY RIGHT AXILLA;  Surgeon: Stark Klein, MD;  Location: [place];  Service: General;  Laterality: Right;   CARDIOVERSION N/A 05/10/2020   Procedure: CARDIOVERSION;  Surgeon: Freada Bergeron, MD;  Location: [facility];  Service: Cardiovascular;  Laterality: N/A;   COLONOSCOPY  2002   FASCIECTOMY Left 03/28/2019   Procedure: SEGMENTAL FASCIECTOMY LEFT RING FINGER;  Surgeon: Daryll Brod, MD;  Location: [place];  Service: Orthopedics;  Laterality: Left;  ANESTHESIA  AXILLARY BLOCK   INSERTION PROSTATE RADIATION SEED  8 4 2010   per Dr Rosana Hoes   MELANOMA EXCISION Right 06/22/2019   Procedure: WIDE LOCAL EXCISION RIGHT CHEST WALL MELANOMA, ADVANCEMENT FLAP CLOSURE FOR DEFECT 3X6 CM;  Surgeon: Stark Klein, MD;  Location: [place];  Service: General;  Laterality: Right;   POLYPECTOMY  2002   THROAT SURGERY  1980   benign cyst   UPPER GASTROINTESTINAL ENDOSCOPY     URETHRAL STRICTURE DILATATION     also penile implant  Current Medications: Current Meds  Medication Sig   metoprolol tartrate (LOPRESSOR) 25 MG tablet Take 0.5 tablets (12.5 mg total) by mouth 2 (two) times daily.   omeprazole (PRILOSEC) 20 MG capsule Take 20 mg by mouth as needed (heartburn).   PRADAXA 150 MG CAPS capsule TAKE 1 CAPSULE BY MOUTH TWICE A DAY   sucralfate (CARAFATE) 1 g tablet TAKE 1 TABLET BY MOUTH TWICE A DAY   Tamsulosin HCl (FLOMAX) 0.4 MG CAPS Take 0.4 mg by mouth daily.   valsartan (DIOVAN) 320 MG tablet Take 0.5 tablets (160 mg total) by mouth daily.     Allergies:   Patient has no known allergies.   Social History   Socioeconomic History    Marital status: Single    Spouse name: Not on file   Number of children: 0   Years of education: Not on file   Highest education level: Not on file  Occupational History   Occupation: retired  Tobacco Use   Smoking status: Never   Smokeless tobacco: Never  Vaping Use   Vaping Use: Never used  Substance and Sexual Activity   Alcohol use: Not Currently    Comment: social   Drug use: No   Sexual activity: Yes  Other Topics Concern   Not on file  Social History Narrative   Not on file   Social Determinants of Health   Financial Resource Strain: Low Risk    Difficulty of Paying Living Expenses: Not very hard  Food Insecurity: Not on file  Transportation Needs: Not on file  Physical Activity: Not on file  Stress: Not on file  Social Connections: Not on file     Family History: The patient's family history includes Arrhythmia in his brother; Colon cancer (age of onset: 45) in his mother; Coronary artery disease in his brother; Depression in his mother; Diabetes in his brother and brother; Hearing loss in his brother; Heart disease in his brother and father; Heart failure in his brother; Hyperlipidemia in his sister; Hypertension in his brother, brother, brother, and father; Stroke in his mother and sister; Sudden death in his father; Suicidality in his sister. There is no history of Esophageal cancer, Pancreatic cancer, Prostate cancer, Rectal cancer, or Stomach cancer.  ROS:   Please see the history of present illness.    All other systems reviewed and are negative.  EKGs/Labs/Other Studies Reviewed:    The following studies were reviewed today:   EKG:  The ekg ordered today demonstrates sinus rhythm  Recent Labs: 06/07/2020: TSH 1.98 12/17/2020: ALT 6; BUN 14; Creat 1.26; Hemoglobin 13.2; Platelets 237; Potassium 3.6; Sodium 139  Recent Lipid Panel    Component Value Date/Time   CHOL 112 12/17/2020 0827   TRIG 158 (H) 12/17/2020 0827   HDL 37 (L) 12/17/2020 0827    CHOLHDL 3.0 12/17/2020 0827   VLDL 34 (H) 01/18/2017 0813   LDLCALC 51 12/17/2020 0827    Physical Exam:    VS:  BP 132/74   Pulse 63   Ht 5\' 9"  (1.753 m)   Wt 185 lb (83.9 kg)   SpO2 99%   BMI 27.32 kg/m     Wt Readings from Last 3 Encounters:  01/31/21 185 lb (83.9 kg)  12/17/20 177 lb 12.8 oz (80.6 kg)  10/17/20 175 lb (79.4 kg)     GEN:  Well nourished, well developed in no acute distress HEENT: Normal NECK: No JVD; No carotid bruits LYMPHATICS: No lymphadenopathy CARDIAC: RRR, no murmurs, rubs, gallops  RESPIRATORY:  Clear to auscultation without rales, wheezing or rhonchi  ABDOMEN: Soft, non-tender, non-distended MUSCULOSKELETAL:  No edema; No deformity  SKIN: Warm and dry NEUROLOGIC:  Alert and oriented x 3 PSYCHIATRIC:  Normal affect   ASSESSMENT:    1. Persistent atrial fibrillation ([place])   2. Atrial flutter, unspecified type ([place])   3. Essential hypertension    PLAN:    In order of problems listed above:  1. Persistent atrial fibrillation ([place]) 2. Atrial flutter, unspecified type [facility]) Doing well after his September 2021 ablation.  Maintaining normal rhythm.  On Pradaxa for stroke prophylaxis.  Recommend he continue taking metoprolol 12.5 mg by mouth twice daily.  3. Essential hypertension At goal.  Continue current home regimen.    Follow-up 1 year.     Medication Adjustments/Labs and Tests Ordered: Current medicines are reviewed at length with the patient today.  Concerns regarding medicines are outlined above.  No orders of the defined types were placed in this encounter.  No orders of the defined types were placed in this encounter.    Signed, Lars Mage, MD, [facility], [facility] 01/31/2021 6:00 PM    Electrophysiology [facility]

## 2021-01-31 NOTE — Patient Instructions (Signed)
Medication Instructions:  Your physician recommends that you continue on your current medications as directed. Please refer to the Current Medication list given to you today. *If you need a refill on your cardiac medications before your next appointment, please call your pharmacy*  Lab Work: None ordered. If you have labs (blood work) drawn today and your tests are completely normal, you will receive your results only by: . MyChart Message (if you have MyChart) OR . A paper copy in the mail If you have any lab test that is abnormal or we need to change your treatment, we will call you to review the results.  Testing/Procedures: None ordered.  Follow-Up: At [facility], you and your health needs are our priority.  As part of our continuing mission to provide you with exceptional heart care, we have created designated Provider Care Teams.  These Care Teams include your primary Cardiologist (physician) and Advanced Practice Providers (APPs -  Physician Assistants and Nurse Practitioners) who all work together to provide you with the care you need, when you need it.  Your next appointment:   Your physician wants you to follow-up in: one year with Dr. Lambert.   You will receive a reminder letter in the mail two months in advance. If you don't receive a letter, please call our office to schedule the follow-up appointment.    

## 2021-02-11 ENCOUNTER — Other Ambulatory Visit: Payer: Self-pay | Admitting: Gastroenterology

## 2021-02-11 DIAGNOSIS — D509 Iron deficiency anemia, unspecified: Secondary | ICD-10-CM

## 2021-02-11 DIAGNOSIS — K2970 Gastritis, unspecified, without bleeding: Secondary | ICD-10-CM

## 2021-03-03 ENCOUNTER — Telehealth: Payer: Self-pay | Admitting: Cardiology

## 2021-03-03 NOTE — Telephone Encounter (Signed)
  STAT if patient feels like he/she is going to faint   Are you dizzy now? Not at this time but did have an episode this morning  Do you feel faint or have you passed out? No, but has fell a couple of times  Do you have any other symptoms? No other symptoms  Have you checked your HR and BP (record if available)? 119/66, 132/67

## 2021-03-03 NOTE — Telephone Encounter (Signed)
Pts friend, [name] on [place], called to report that the ot has been having dizziness worsening over the past few weeks.   She says that it causes him to stagger and he then loses balance and has fallen a few times without injury. He does not lose consciousness. She reports that it is not related to position changing... his BP has been consistent around 119/66 and 132/67 and most readings in between. She is unsure of his Heartrate.   He still eats and drink well but never has been much.   He denies chest pain, palpitations, no increasing dyspnea.   I advised her to continue to monitor. She will also reach out ton his PCP Dr. Dennard Schaumann.. pt has h/o dehydration and anemia.   I will also forward to Dr. Marlou Porch for review for any further recommendations.   His next OV with Dr. Marlou Porch 05/02/21.

## 2021-03-04 NOTE — Telephone Encounter (Signed)
Left message to call back with [name] number.   House number was busy

## 2021-03-04 NOTE — Telephone Encounter (Signed)
Patient returning call.

## 2021-03-04 NOTE — Telephone Encounter (Signed)
Agree. Please contact PCP as this could be disequilibrium since BP's are normal.  Candee Furbish, MD   Gave advisement from Dr. Marlou Porch to patient. Verbalized agreement and states he has made an appointment with a PA at his PCP on Friday to get checked out. Discussed ways to prevent falls. Verbalized understanding.

## 2021-03-05 ENCOUNTER — Other Ambulatory Visit: Payer: Self-pay | Admitting: Family Medicine

## 2021-03-07 ENCOUNTER — Other Ambulatory Visit: Payer: Self-pay | Admitting: Nurse Practitioner

## 2021-03-07 ENCOUNTER — Ambulatory Visit (INDEPENDENT_AMBULATORY_CARE_PROVIDER_SITE_OTHER): Payer: Medicare Other | Admitting: Nurse Practitioner

## 2021-03-07 ENCOUNTER — Encounter: Payer: Self-pay | Admitting: Nurse Practitioner

## 2021-03-07 ENCOUNTER — Other Ambulatory Visit: Payer: Self-pay

## 2021-03-07 VITALS — BP 136/74 | HR 73 | Ht 69.0 in | Wt 190.0 lb

## 2021-03-07 DIAGNOSIS — R270 Ataxia, unspecified: Secondary | ICD-10-CM

## 2021-03-07 DIAGNOSIS — R2689 Other abnormalities of gait and mobility: Secondary | ICD-10-CM

## 2021-03-07 DIAGNOSIS — F419 Anxiety disorder, unspecified: Secondary | ICD-10-CM

## 2021-03-07 NOTE — Progress Notes (Signed)
Subjective:    Patient ID: Antonio Clark, male    DOB: [birth year], 85 y.o.   MRN: [id number]  HPI: Antonio Clark is a 85 y.o. male presenting with friend, [name], for feeling off balance.  Chief Complaint  Patient presents with   BALANCE ISSUE    Had some falls, balance issues that comes and goes, usually happens in the morning   OFF BALANCE Duration: 3.5 months ago Description of symptoms: unsteady, feels off balance Duration of episode: minutes Dizziness frequency: all day intermittently Aggravating/provoking factors:  changing positions, walking Triggered by rolling over in bed: no Triggered by bending over: yes Aggravated by head movement: no Aggravated by exertion: no Aggravated by coughing: no Aggravated by loud noises: no Recent head injury: no Recent or current viral symptoms: had pneumonia in May 2022.  History of vasovagal episodes: no Nausea: no Vomiting: no Tinnitus: no Hearing loss: no Aural fullness: no Headache: no Photophobia:no Phonophobia:  no Unsteady gait: yes Postural instability: yes Diplopia: no Dysarthria: no Dysphagia: no Weakness: yes Related to exertion: no Pallor: no Diaphoresis: no Dyspnea: no Chest pain: no  No Known Allergies  Outpatient Encounter Medications as of 03/07/2021  Medication Sig   atorvastatin (LIPITOR) 40 MG tablet Take 40 mg by mouth daily.   metoprolol tartrate (LOPRESSOR) 25 MG tablet Take 0.5 tablets (12.5 mg total) by mouth 2 (two) times daily.   PRADAXA 150 MG CAPS capsule TAKE 1 CAPSULE BY MOUTH TWICE A DAY   sucralfate (CARAFATE) 1 g tablet TAKE 1 TABLET BY MOUTH TWICE A DAY   Tamsulosin HCl (FLOMAX) 0.4 MG CAPS Take 0.4 mg by mouth daily.   valsartan (DIOVAN) 320 MG tablet Take 0.5 tablets (160 mg total) by mouth daily.   ferrous sulfate 324 (65 Fe) MG TBEC TAKE 1 TABLET BY MOUTH EVERY DAY (Patient not taking: Reported on 03/07/2021)   omeprazole (PRILOSEC) 20 MG capsule Take 20 mg by mouth as needed  (heartburn). (Patient not taking: Reported on 03/07/2021)   pantoprazole (PROTONIX) 40 MG tablet TAKE 1 TABLET BY MOUTH EVERY DAY (Patient not taking: Reported on 03/07/2021)   No facility-administered encounter medications on file as of 03/07/2021.    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   Coronary artery disease 11/02/2011   Urethral stricture 06/22/2011   Hyperlipidemia    Borderline diabetic    Hypertension     Past Medical History:  Diagnosis Date   Anal fissure    Arthritis    Atrial fibrillation (HCC)    Back pain    Borderline diabetic    Colon polyps    Dysrhythmia    a-fib   GERD (gastroesophageal reflux disease)    History of echocardiogram 07/ 07/ 2011   History of lithotripsy 1989   Hyperlipidemia    Hypertension    Kidney stones    Melanoma ([place]) 05/01/2019   right chest wall (12/20)   Pre-diabetes    Prostate CA ([place]) 2010   Sleep apnea    uses CPAP nightly   Squamous cell carcinoma of skin 05/23/1992   bowens-left parietal scalp (CX35FU)   Squamous cell carcinoma of skin 03/14/2008   in situ-left upper outer forehead-medial (CX35FU)  Squamous cell carcinoma of skin 03/14/2008   in situ-crown of scalp (Cx35FU)   Squamous cell carcinoma of skin 04/15/2011   in situ-right dorsal forearm (txpbx)   Squamous cell carcinoma of skin 10/16/2011   in situ-left sideburn   Squamous cell carcinoma of skin 03/11/2015   ka-left sideburn (CX35FU)   Squamous cell carcinoma of skin 03/11/2015   ka-left forearm (CX35FU)   Squamous cell carcinoma of skin 06/22/2018   in situ-left forearm, sup (txpbx)   Squamous cell carcinoma of skin 05/01/2019   in situ-mid anterior scalp     Squamous cell carcinoma of skin 05/01/2019   in situ-right upper arm    Relevant past medical, surgical, family and social history reviewed and updated as indicated. Interim medical history since our last visit reviewed.  Review of Systems Per HPI unless specifically indicated above     Objective:    BP 136/74   Pulse 73   Ht '5\' 9"'$  (1.753 m)   Wt 190 lb (86.2 kg)   SpO2 93%   BMI 28.06 kg/m   Wt Readings from Last 3 Encounters:  03/07/21 190 lb (86.2 kg)  01/31/21 185 lb (83.9 kg)  12/17/20 177 lb 12.8 oz (80.6 kg)    Physical Exam Vitals and nursing note reviewed.  Constitutional:      General: He is not in acute distress.    Appearance: Normal appearance. He is not toxic-appearing.  HENT:     Head: Normocephalic and atraumatic.     Right Ear: Tympanic membrane, ear canal and external ear normal.     Left Ear: Tympanic membrane, ear canal and external ear normal.     Nose: Nose normal. No congestion.     Mouth/Throat:     Mouth: Mucous membranes are moist.     Pharynx: Oropharynx is clear. No oropharyngeal exudate or posterior oropharyngeal erythema.  Eyes:     General: No scleral icterus.       Right eye: No discharge.        Left eye: No discharge.     Extraocular Movements: Extraocular movements intact.     Pupils: Pupils are equal, round, and reactive to light.  Neck:     Vascular: No carotid bruit.  Cardiovascular:     Rate and Rhythm: Normal rate and regular rhythm.     Pulses: Normal pulses.     Heart sounds: Normal heart sounds. No murmur heard. Pulmonary:     Effort: Pulmonary effort is normal. No respiratory distress.     Breath sounds: Normal breath sounds. No wheezing or rales.  Musculoskeletal:     Cervical back: Normal range of motion.     Right lower leg: No edema.     Left lower leg: No edema.  Skin:    General: Skin is warm and dry.     Capillary Refill: Capillary refill takes less than 2 seconds.     Coloration: Skin is not jaundiced or  pale.     Findings: No erythema.  Neurological:     Mental Status: He is alert and oriented to person, place, and time.     Cranial Nerves: No cranial nerve deficit, dysarthria or facial asymmetry.     Sensory: No sensory deficit.     Motor: No weakness or pronator drift.     Coordination: Romberg sign negative. Coordination normal.     Gait: Gait abnormal.  Psychiatric:        Mood and Affect: Mood normal.  Behavior: Behavior normal.        Thought Content: Thought content normal.        Judgment: Judgment normal.      Assessment & Plan:  1. Balance problem Acute.  History and gail instability today are concerning for possible ischemic stroke. Will check blood work including TSH, CMET, CBC.  Will obtain STAT brain MRI to rule out stroke.  With any changes or worsening in his symptoms, I advised patient to seek emergent care in the ER.    - CBC with Differential - TSH - COMPLETE METABOLIC PANEL WITH GFR  2. Ataxia Acute.  History and gait instability today and concerning for possible ischemic stroke.  Will obtain STAT MRI imaging to rule out.  With any changes in his symptoms or worsening symptoms, I advised patient to seek emergent care in the ER.    - MR Brain W Wo Contrast; Future   Follow up plan: Return for pending lab work.

## 2021-03-08 LAB — CBC WITH DIFFERENTIAL/PLATELET
Absolute Monocytes: 520 cells/uL (ref 200–950)
Basophils Absolute: 39 cells/uL (ref 0–200)
Basophils Relative: 0.6 %
Eosinophils Absolute: 293 cells/uL (ref 15–500)
Eosinophils Relative: 4.5 %
HCT: 38.8 % (ref 38.5–50.0)
Hemoglobin: 12.7 g/dL — ABNORMAL LOW (ref 13.2–17.1)
Lymphs Abs: 1671 cells/uL (ref 850–3900)
MCH: 28.3 pg (ref 27.0–33.0)
MCHC: 32.7 g/dL (ref 32.0–36.0)
MCV: 86.4 fL (ref 80.0–100.0)
MPV: 9.8 fL (ref 7.5–12.5)
Monocytes Relative: 8 %
Neutro Abs: 3978 cells/uL (ref 1500–7800)
Neutrophils Relative %: 61.2 %
Platelets: 143 10*3/uL (ref 140–400)
RBC: 4.49 10*6/uL (ref 4.20–5.80)
RDW: 13.1 % (ref 11.0–15.0)
Total Lymphocyte: 25.7 %
WBC: 6.5 10*3/uL (ref 3.8–10.8)

## 2021-03-08 LAB — COMPLETE METABOLIC PANEL WITH GFR
AG Ratio: 1.8 (calc) (ref 1.0–2.5)
ALT: 10 U/L (ref 9–46)
AST: 14 U/L (ref 10–35)
Albumin: 4.2 g/dL (ref 3.6–5.1)
Alkaline phosphatase (APISO): 65 U/L (ref 35–144)
BUN: 15 mg/dL (ref 7–25)
CO2: 27 mmol/L (ref 20–32)
Calcium: 9.7 mg/dL (ref 8.6–10.3)
Chloride: 106 mmol/L (ref 98–110)
Creat: 1.16 mg/dL (ref 0.70–1.22)
Globulin: 2.4 g/dL (calc) (ref 1.9–3.7)
Glucose, Bld: 126 mg/dL — ABNORMAL HIGH (ref 65–99)
Potassium: 4 mmol/L (ref 3.5–5.3)
Sodium: 141 mmol/L (ref 135–146)
Total Bilirubin: 0.7 mg/dL (ref 0.2–1.2)
Total Protein: 6.6 g/dL (ref 6.1–8.1)
eGFR: 62 mL/min/{1.73_m2} (ref >=60)

## 2021-03-08 LAB — TSH: TSH: 1.52 mIU/L (ref 0.40–4.50)

## 2021-03-11 NOTE — Telephone Encounter (Signed)
Ok to refill??  Last office visit 03/07/2021.  Last refill 10/17/2020.  Of note, medication is not on current list.

## 2021-03-12 ENCOUNTER — Telehealth: Payer: Self-pay | Admitting: Pharmacist

## 2021-03-12 NOTE — Progress Notes (Addendum)
Chronic Care Management Pharmacy Assistant   Name: Antonio Clark  MRN: [id number] DOB: [birth year]  Reason for Encounter: Disease State For HTN.    Conditions to be addressed/monitored: HTN, CAD, Afib, HLD, borderline DM.  Recent office visits:  03/07/21 Eulogio Bear, NP. For balance problem. No medication changes.  Recent consult visits:  01/31/21 Cardiology Vickie Epley, MD. For Afib. STOPPED Guaifenesin.   Hospital visits:  None since 12/30/20.  Medications: Outpatient Encounter Medications as of 03/12/2021  Medication Sig   atorvastatin (LIPITOR) 40 MG tablet Take 40 mg by mouth daily.   ferrous sulfate 324 (65 Fe) MG TBEC TAKE 1 TABLET BY MOUTH EVERY DAY (Patient not taking: Reported on 03/07/2021)   LORazepam (ATIVAN) 0.5 MG tablet TAKE 1 TABLET BY MOUTH EVERY DAY AS NEEDED FOR ANXIETY   metoprolol tartrate (LOPRESSOR) 25 MG tablet Take 0.5 tablets (12.5 mg total) by mouth 2 (two) times daily.   omeprazole (PRILOSEC) 20 MG capsule Take 20 mg by mouth as needed (heartburn). (Patient not taking: Reported on 03/07/2021)   pantoprazole (PROTONIX) 40 MG tablet TAKE 1 TABLET BY MOUTH EVERY DAY (Patient not taking: Reported on 03/07/2021)   PRADAXA 150 MG CAPS capsule TAKE 1 CAPSULE BY MOUTH TWICE A DAY   sucralfate (CARAFATE) 1 g tablet TAKE 1 TABLET BY MOUTH TWICE A DAY   Tamsulosin HCl (FLOMAX) 0.4 MG CAPS Take 0.4 mg by mouth daily.   valsartan (DIOVAN) 320 MG tablet Take 0.5 tablets (160 mg total) by mouth daily.   No facility-administered encounter medications on file as of 03/12/2021.    Reviewed chart prior to disease state call. Spoke with patient regarding BP  Recent Office Vitals: BP Readings from Last 3 Encounters:  03/07/21 136/74  01/31/21 132/74  12/17/20 126/86   Pulse Readings from Last 3 Encounters:  03/07/21 73  01/31/21 63  12/17/20 75    Wt Readings from Last 3 Encounters:  03/07/21 190 lb (86.2 kg)  01/31/21 185 lb (83.9 kg)  12/17/20 177  lb 12.8 oz (80.6 kg)     Kidney Function Lab Results  Component Value Date/Time   CREATININE 1.16 03/07/2021 01:00 PM   CREATININE 1.26 (H) 12/17/2020 08:27 AM   GFR 78.21 08/21/2013 12:19 PM   GFRNONAA 53 (L) 12/17/2020 08:27 AM   GFRAA 61 12/17/2020 08:27 AM    BMP Latest Ref Rng & Units 03/07/2021 12/17/2020 07/08/2020  Glucose 65 - 99 mg/dL 126(H) 187(H) 168(H)  BUN 7 - 25 mg/dL '15 14 21  '$ Creatinine 0.70 - 1.22 mg/dL 1.16 1.26(H) 1.24(H)  BUN/Creat Ratio 6 - 22 (calc) NOT APPLICABLE 11 17  Sodium 135 - 146 mmol/L 141 139 140  Potassium 3.5 - 5.3 mmol/L 4.0 3.6 4.6  Chloride 98 - 110 mmol/L 106 101 108  CO2 20 - 32 mmol/L '27 25 25  '$ Calcium 8.6 - 10.3 mg/dL 9.7 9.7 9.5    Current antihypertensive regimen:  Valsartan 320 mg 0.5 tablets by mouth daily. Metoprolol 25 mg 0.5 tablets by mouth 2 (two) times daily.  How often are you checking your Blood Pressure? Patient stated daily  Current home BP readings: 03/12/21-131/62  What recent interventions/DTPs have been made by any provider to improve Blood Pressure control since last CPP Visit: None.   Any recent hospitalizations or ED visits since last visit with CPP? Patient stated no.  What diet changes have been made to improve Blood Pressure Control?  Patient stated he is eating three meals a  day. He stated he eats a good amount of vegetables and fruits daily, as well as plenty of water.   What exercise is being done to improve your Blood Pressure Control?  Patient stated he's been a little off balance lately so he hasn't been able to do any regular exercising. He stated he needs to get a MRI but cant get his records from his old office, I called for him and left a voicemail for them to give him a call when they are able.   Adherence Review: Is the patient currently on ACE/ARB medication? Valsartan 320 mg  Does the patient have >5 day gap between last estimated fill dates? Per mis rtps, no.   Star Rating Drugs: Atorvastatin  40 mg 90 DS 03/06/21, Valsartan 320 mg 90 DS 01/01/21.  Follow-Up:Pharmacist Review   Charlann Lange, RMA Clinical Pharmacist Assistant [phone]   10 minutes spent in review, coordination, and documentation.  Reviewed by: Beverly Milch, PharmD Clinical Pharmacist [phone]

## 2021-03-24 ENCOUNTER — Other Ambulatory Visit: Payer: Self-pay | Admitting: Gastroenterology

## 2021-03-24 DIAGNOSIS — D509 Iron deficiency anemia, unspecified: Secondary | ICD-10-CM

## 2021-03-24 DIAGNOSIS — K2970 Gastritis, unspecified, without bleeding: Secondary | ICD-10-CM

## 2021-04-16 ENCOUNTER — Telehealth: Payer: Self-pay | Admitting: *Deleted

## 2021-04-16 NOTE — Telephone Encounter (Signed)
Received call from patient SO, Joelene Millin.   Reports that patient would like to have MRI performed at [facility].  Please send orders to imaging center of patient choice.

## 2021-04-21 ENCOUNTER — Telehealth: Payer: Self-pay | Admitting: *Deleted

## 2021-04-21 NOTE — Telephone Encounter (Signed)
Received call from patient wife, [name]. [phone] telephone.  Reports that patient is scheduled for MRI at [facility]. States that patient has increased anxiety and claustrophobia.   Requested medication to ease his anxiety to have scan.   Of note, patient currently has prescription for Ativan 0.'5mg'$ .   Please advise.

## 2021-04-21 NOTE — Telephone Encounter (Signed)
Call placed to patient and patient made aware.  

## 2021-04-27 ENCOUNTER — Other Ambulatory Visit: Payer: Self-pay | Admitting: Gastroenterology

## 2021-04-27 DIAGNOSIS — K2970 Gastritis, unspecified, without bleeding: Secondary | ICD-10-CM

## 2021-04-27 DIAGNOSIS — D509 Iron deficiency anemia, unspecified: Secondary | ICD-10-CM

## 2021-04-29 ENCOUNTER — Telehealth: Payer: Self-pay | Admitting: *Deleted

## 2021-04-29 NOTE — Telephone Encounter (Signed)
Received VM from patient. Inquired as to results of recent MRI performed at [facility].   Imaging report noted as follows: MRI BRAIN WITHOUT AND WITH CONTRAST   INDICATION: Ataxia, R27.0 Ataxia, unspecified   COMPARISON: None   TECHNIQUE/PROTOCOL: Standard adult brain protocol without and with IV contrast.   FINDINGS:  Brain Parenchyma:  No hemorrhage, cerebral edema, acute cortical infarction, mass, mass effect, or midline shift.  No abnormal enhancement. There are punctate and confluent foci of increased/T2 signal in the periventricular and deep subcortical [place] matter which is nonspecific but favored to represent advanced small vessel disease. There is a nonspecific foci of susceptibility in the right posterior temporal lobe.  Ventricles and Sulci: There is marked prominence of the ventricles and sulci compatible with advanced volume loss. There is also slightly disproportionate cerebellar volume loss. The callosal angle is > 90  degrees.  Extra-Axial Spaces: No extra-axial fluid collection.  Basal Cisterns: Normal.  Intracranial Flow-Voids: Normal.   Paranasal Sinuses: There is trace fluid in the maxillary sinuses and mild mucosal thickening of the ethmoid air cells.  Mastoid air cells: well aerated  Orbits: Bilateral cataract surgeries.  Cranium: Normal.   IMPRESSION:  Advanced global volume loss and severe chronic small vessel disease with slightly disproportionate atrophy of the cerebellum.   Electronically Reviewed by:  Jonette Eva, MD, [facility]  Electronically Reviewed on:  04/23/2021 10:22 PM   I have reviewed the images and concur with the above findings.   Electronically Signed by:  Casimer Bilis, MD  Electronically Signed on:  04/24/2021 12:10 PM

## 2021-04-30 ENCOUNTER — Encounter: Payer: Self-pay | Admitting: Dermatology

## 2021-04-30 ENCOUNTER — Ambulatory Visit: Payer: Medicare Other | Admitting: Dermatology

## 2021-04-30 ENCOUNTER — Other Ambulatory Visit: Payer: Self-pay

## 2021-04-30 DIAGNOSIS — Z85820 Personal history of malignant melanoma of skin: Secondary | ICD-10-CM | POA: Diagnosis not present

## 2021-04-30 DIAGNOSIS — D044 Carcinoma in situ of skin of scalp and neck: Secondary | ICD-10-CM | POA: Diagnosis not present

## 2021-04-30 DIAGNOSIS — C44629 Squamous cell carcinoma of skin of left upper limb, including shoulder: Secondary | ICD-10-CM | POA: Diagnosis not present

## 2021-04-30 DIAGNOSIS — Z1283 Encounter for screening for malignant neoplasm of skin: Secondary | ICD-10-CM | POA: Diagnosis not present

## 2021-04-30 DIAGNOSIS — D485 Neoplasm of uncertain behavior of skin: Secondary | ICD-10-CM

## 2021-04-30 DIAGNOSIS — Z85828 Personal history of other malignant neoplasm of skin: Secondary | ICD-10-CM | POA: Diagnosis not present

## 2021-04-30 DIAGNOSIS — D0439 Carcinoma in situ of skin of other parts of face: Secondary | ICD-10-CM | POA: Diagnosis not present

## 2021-04-30 DIAGNOSIS — L570 Actinic keratosis: Secondary | ICD-10-CM

## 2021-04-30 NOTE — Telephone Encounter (Signed)
Call placed to patient. LMTRC.  

## 2021-04-30 NOTE — Telephone Encounter (Signed)
Call placed to patient and patient significant other made aware.

## 2021-04-30 NOTE — Patient Instructions (Signed)

## 2021-05-02 ENCOUNTER — Ambulatory Visit: Payer: Medicare Other | Admitting: Cardiology

## 2021-05-02 ENCOUNTER — Other Ambulatory Visit: Payer: Self-pay | Admitting: *Deleted

## 2021-05-02 ENCOUNTER — Telehealth: Payer: Self-pay | Admitting: *Deleted

## 2021-05-02 ENCOUNTER — Encounter: Payer: Self-pay | Admitting: Cardiology

## 2021-05-02 ENCOUNTER — Other Ambulatory Visit: Payer: Self-pay

## 2021-05-02 ENCOUNTER — Other Ambulatory Visit: Payer: Self-pay | Admitting: Cardiology

## 2021-05-02 DIAGNOSIS — I4819 Other persistent atrial fibrillation: Secondary | ICD-10-CM | POA: Diagnosis not present

## 2021-05-02 DIAGNOSIS — I951 Orthostatic hypotension: Secondary | ICD-10-CM | POA: Insufficient documentation

## 2021-05-02 DIAGNOSIS — I10 Essential (primary) hypertension: Secondary | ICD-10-CM

## 2021-05-02 DIAGNOSIS — E7800 Pure hypercholesterolemia, unspecified: Secondary | ICD-10-CM | POA: Diagnosis not present

## 2021-05-02 DIAGNOSIS — E119 Type 2 diabetes mellitus without complications: Secondary | ICD-10-CM | POA: Diagnosis not present

## 2021-05-02 MED ORDER — AZITHROMYCIN 250 MG PO TABS: ORAL_TABLET | ORAL | 0 refills | Status: DC

## 2021-05-02 NOTE — Telephone Encounter (Signed)
Received call from patient [name].   States that patient has cough, congestion and wheezing in lung.   States that patient was seen at cardiology today and appointment made for Monday with PCP. States that cards recommended prednisone, but was unable to prescribe.   PCP made aware and new orders given for [facility].   Prescription sent to pharmacy.   Call placed to patient and [name] made aware.

## 2021-05-02 NOTE — H&P (View-Only) (Signed)
Cardiology Office Note:    Date:  05/02/2021   ID:  Antonio Clark, DOB 08/25/1935, MRN [id number]  PCP:  Susy Frizzle, MD   [facility] Providers Cardiologist:  Candee Furbish, MD Electrophysiologist:  Vickie Epley, MD     Referring MD: Susy Frizzle, MD    History of Present Illness:    Antonio Clark is a 85 y.o. male here for the follow-up of atrial fibrillation prior ablation April 17, 2020.  No recurrence of arrhythmia.  On Pradaxa for stroke prophylaxis.  No bleeding.  No chest pain.  He has had several falls recently.  Feels some pulmonary congestion, coughing.  Has felt unstable, unbalanced.  No recent change in his hearing eyesight or proprioception.  Mostly happens in the morning, feels unsteady.  He has not been going to the [facility] and he does feel like his legs may be weaker.  His daughter is here with him, Antonio Clark.  Interestingly, in review of his records here he had an MRI of his brain on 04/24/2021 that showed global volume loss that is advanced with severe chronic small vessel disease with slightly disproportionate atrophy of the cerebellum.  This was ordered by Dr. Doren Custard secondary to ataxia unspecified.  And also seeing a visit on 03/07/2021 about 2 months ago with Noemi Chapel, NP for balance problems.  Today he does show signs of orthostatics that are positive.  133/80 laying down after standing for 3 minutes 108/66.  Heart rate remained fairly steady ranging from 77-86.  He also showed return of atrial fibrillation.  He saw Dr. Quentin Ore back in June 2022.  Past Medical History:  Diagnosis Date   Anal fissure    Arthritis    Atrial fibrillation (HCC)    Back pain    Borderline diabetic    Colon polyps    Dysrhythmia    a-fib   GERD (gastroesophageal reflux disease)    History of echocardiogram 07/ 07/ 2011   History of lithotripsy 1989   Hyperlipidemia    Hypertension    Kidney stones    Melanoma ([place]) 05/01/2019   right chest wall (12/20)    Pre-diabetes    Prostate CA ([place]) 2010   Sleep apnea    uses CPAP nightly   Squamous cell carcinoma of skin 05/23/1992   bowens-left parietal scalp (CX35FU)   Squamous cell carcinoma of skin 03/14/2008   in situ-left upper outer forehead-medial (CX35FU)   Squamous cell carcinoma of skin 03/14/2008   in situ-crown of scalp (Cx35FU)   Squamous cell carcinoma of skin 04/15/2011   in situ-right dorsal forearm (txpbx)   Squamous cell carcinoma of skin 10/16/2011   in situ-left sideburn   Squamous cell carcinoma of skin 03/11/2015   ka-left sideburn (CX35FU)   Squamous cell carcinoma of skin 03/11/2015   ka-left forearm (CX35FU)   Squamous cell carcinoma of skin 06/22/2018   in situ-left forearm, sup (txpbx)   Squamous cell carcinoma of skin 05/01/2019   in situ-mid anterior scalp    Squamous cell carcinoma of skin 05/01/2019   in situ-right upper arm    Past Surgical History:  Procedure Laterality Date   ATRIAL FIBRILLATION ABLATION N/A 04/17/2020   Procedure: [address];  Surgeon: Vickie Epley, MD;  Location: [facility];  Service: Cardiovascular;  Laterality: N/A;   AXILLARY SENTINEL NODE BIOPSY Right 08/23/2019   Procedure: SENTINEL LYMPH NODE BIOSPY RIGHT AXILLA;  Surgeon: Stark Klein, MD;  Location: [facility];  Service: General;  Laterality: Right;   CARDIOVERSION N/A 05/10/2020   Procedure: CARDIOVERSION;  Surgeon: Freada Bergeron, MD;  Location: [facility];  Service: Cardiovascular;  Laterality: N/A;   COLONOSCOPY  2002   FASCIECTOMY Left 03/28/2019   Procedure: SEGMENTAL FASCIECTOMY LEFT RING FINGER;  Surgeon: Daryll Brod, MD;  Location: [facility];  Service: Orthopedics;  Laterality: Left;  ANESTHESIA  AXILLARY BLOCK   INSERTION PROSTATE RADIATION SEED  8 4 2010   per Dr Rosana Hoes   MELANOMA EXCISION Right 06/22/2019   Procedure: WIDE LOCAL EXCISION RIGHT CHEST WALL MELANOMA, ADVANCEMENT FLAP CLOSURE FOR DEFECT  3X6 CM;  Surgeon: Stark Klein, MD;  Location: [facility];  Service: General;  Laterality: Right;   POLYPECTOMY  2002   THROAT SURGERY  1980   benign cyst   UPPER GASTROINTESTINAL ENDOSCOPY     URETHRAL STRICTURE DILATATION     also penile implant    Current Medications: Current Meds  Medication Sig   atorvastatin (LIPITOR) 40 MG tablet Take 40 mg by mouth daily.   cetirizine (ZYRTEC) 10 MG tablet Take 10 mg by mouth daily.   LORazepam (ATIVAN) 0.5 MG tablet TAKE 1 TABLET BY MOUTH EVERY DAY AS NEEDED FOR ANXIETY   metoprolol tartrate (LOPRESSOR) 25 MG tablet Take 0.5 tablets (12.5 mg total) by mouth 2 (two) times daily. (Patient taking differently: Take 25 mg by mouth 2 (two) times daily.)   omeprazole (PRILOSEC) 20 MG capsule Take 20 mg by mouth as needed (heartburn).   PRADAXA 150 MG CAPS capsule TAKE 1 CAPSULE BY MOUTH TWICE A DAY   [DISCONTINUED] Tamsulosin HCl (FLOMAX) 0.4 MG CAPS Take 0.4 mg by mouth daily.   [DISCONTINUED] valsartan (DIOVAN) 320 MG tablet Take 0.5 tablets (160 mg total) by mouth daily.     Allergies:   Patient has no known allergies.   Social History   Socioeconomic History   Marital status: Single    Spouse name: Not on file   Number of children: 0   Years of education: Not on file   Highest education level: Not on file  Occupational History   Occupation: retired  Tobacco Use   Smoking status: Never   Smokeless tobacco: Never  Vaping Use   Vaping Use: Never used  Substance and Sexual Activity   Alcohol use: Not Currently    Comment: social   Drug use: No   Sexual activity: Yes  Other Topics Concern   Not on file  Social History Narrative   Not on file   Social Determinants of Health   Financial Resource Strain: Low Risk    Difficulty of Paying Living Expenses: Not very hard  Food Insecurity: Not on file  Transportation Needs: Not on file  Physical Activity: Not on file  Stress: Not on file  Social Connections: Not on  file     Family History: The patient's family history includes Arrhythmia in his brother; Colon cancer (age of onset: 26) in his mother; Coronary artery disease in his brother; Depression in his mother; Diabetes in his brother and brother; Hearing loss in his brother; Heart disease in his brother and father; Heart failure in his brother; Hyperlipidemia in his sister; Hypertension in his brother, brother, brother, and father; Stroke in his mother and sister; Sudden death in his father; Suicidality in his sister. There is no history of Esophageal cancer, Pancreatic cancer, Prostate cancer, Rectal cancer, or Stomach cancer.  ROS:   Please see the history  of present illness.     All other systems reviewed and are negative.  EKGs/Labs/Other Studies Reviewed:    The following studies were reviewed today:  Echocardiogram 10/2017 - Left ventricle: The cavity size was normal. There was mild focal   basal hypertrophy of the septum. Systolic function was normal.   The estimated ejection fraction was in the range of 50% to 55%.   Wall motion was normal; there were no regional wall motion   abnormalities. Features are consistent with a pseudonormal left   ventricular filling pattern, with concomitant abnormal relaxation   and increased filling pressure (grade 2 diastolic dysfunction). - Aortic valve: Trileaflet; moderately thickened, mildly calcified   leaflets. There was mild regurgitation. Regurgitation pressure   half-time: 701 ms. - Aorta: Aortic root dimension: 38 mm (ED). - Aortic root: The aortic root was mildly dilated. - Mitral valve: Calcified annulus. There was trivial regurgitation. - Left atrium: The atrium was moderately dilated. - Right ventricle: The cavity size was mildly dilated. Wall   thickness was normal. - Atrial septum: There was increased thickness of the septum,   consistent with lipomatous hypertrophy. - Tricuspid valve: There was trivial regurgitation. - Pulmonic valve:  There was trivial regurgitation.      EKG:  EKG is  ordered today.  The ekg ordered today demonstrates AFIB 79 occasional PVC/aberrant conducted beat  Recent Labs: 03/07/2021: ALT 10; BUN 15; Creat 1.16; Hemoglobin 12.7; Platelets 143; Potassium 4.0; Sodium 141; TSH 1.52  Recent Lipid Panel    Component Value Date/Time   CHOL 112 12/17/2020 0827   TRIG 158 (H) 12/17/2020 0827   HDL 37 (L) 12/17/2020 0827   CHOLHDL 3.0 12/17/2020 0827   VLDL 34 (H) 01/18/2017 0813   LDLCALC 51 12/17/2020 0827     Risk Assessment/Calculations:          Physical Exam:    VS:  BP 120/84   Pulse 91   Ht 5\' 9"  (1.753 m)   Wt 181 lb (82.1 kg)   SpO2 96%   BMI 26.73 kg/m     Wt Readings from Last 3 Encounters:  05/02/21 181 lb (82.1 kg)  03/07/21 190 lb (86.2 kg)  01/31/21 185 lb (83.9 kg)     GEN:  Well nourished, well developed in no acute distress HEENT: Normal NECK: No JVD; No carotid bruits LYMPHATICS: No lymphadenopathy CARDIAC: Irreg, no murmurs, rubs, gallops RESPIRATORY: Mild crackles heard left base ABDOMEN: Soft, non-tender, non-distended MUSCULOSKELETAL:  No edema; No deformity  SKIN: Warm and dry NEUROLOGIC:  Alert and oriented x 3 PSYCHIATRIC:  Normal affect   ASSESSMENT:    1. Persistent atrial fibrillation ([place])   2. Diabetes mellitus with coincident hypertension ([place])   3. Orthostatic hypotension   4. Pure hypercholesterolemia    PLAN:    In order of problems listed above:  Atrial fibrillation ([place]) EKG today shows atrial fibrillation heart rate 79 bpm.  We will go ahead and set him up for cardioversion.  Risk and benefits explained.  Only proceed.  Daughter present for discussion.  Continue with metoprolol 25 mg twice a day.  Continue with Pradaxa 150 mg twice a day.  He has not missed any dosing.  He is post atrial fibrillation ablation.  Dr. Quentin Ore.  Diabetes mellitus with coincident hypertension (HCC) Last hemoglobin A1c 6.7.  Currently diet  controlled.  Continue with statin atorvastatin 40 mg once a day.  Orthostatic hypotension Likely playing a role in his unsteady type symptoms especially in  the morning hours.  Blood pressure dropped as described above.  We will go ahead and stop his valsartan 160 mg daily.  We will also stop his Flomax 0.4 mg daily.  He states that he rarely takes his Flomax regardless.  Continue with hydration. Okay to continue with metoprolol 25 mg twice a day for atrial fibrillation.  Hyperlipidemia Continue with atorvastatin 40 mg a day.  LDL 51.  Excellent.  ALT 10.  Strong family history of coronary artery disease.   I called up Dr. Samella Parr office to discuss her concerns over cough.  In the past, he has progressed to pneumonia.  He may need further treatment strategy.  I have set him up with an appointment on Monday morning at 10 AM.  They know that if symptoms progress to seek care with urgent care.  They were very appreciative.   41 minutes spent with patient   Medication Adjustments/Labs and Tests Ordered: Current medicines are reviewed at length with the patient today.  Concerns regarding medicines are outlined above.  Orders Placed This Encounter  Procedures   EKG 12-Lead   No orders of the defined types were placed in this encounter.   Patient Instructions  Medication Instructions:   STOP TAKING FLOMAX NOW  STOP TAKING VALSARTAN NOW  *If you need a refill on your cardiac medications before your next appointment, please call your pharmacy*    Testing/Procedures:   You are scheduled for a Cardioversion on Thursday 05/08/21 AT 12 PM with Dr. Gardiner Rhyme.  Please arrive at the [facility] (Main Entrance A) at [facility]: [address] [place], [place] [address] at 12 pm. (1 hour prior to procedure unless lab work is needed; if lab work is needed arrive 1.5 hours ahead)  DIET: Nothing to eat or drink after midnight except a sip of water with medications (see medication  instructions below)  FYI: For your safety, and to allow [place] to monitor your vital signs accurately during the surgery/procedure we request that   if you have artificial nails, gel coating, SNS etc. Please have those removed prior to your surgery/procedure. Not having the nail coverings /polish removed may result in cancellation or delay of your surgery/procedure.   Medication Instructions:  Continue your anticoagulant: PRADAXA You will need to continue your anticoagulant after your procedure until you are told by your Provider that it is safe to stop   Labs:   Come to: 05/08/21 AT 12 PM AT [facility]   (Lab option #3) your lab work will be done at the hospital prior to your procedure - you will need to arrive 1  hours ahead of your procedure  You must have a responsible person to drive you home and stay in the waiting area during your procedure. Failure to do so could result in cancellation.  Bring your insurance cards.  *Special Note: Every effort is made to have your procedure done on time. Occasionally there are emergencies that occur at the hospital that may cause delays. Please be patient if a delay does occur.     Follow-Up:  WITH AFIB CLINIC THE WEEK OF October 3rd post cardioversion per Dr. Marlou Porch   6 MONTHS IN THE OFFICE WITH DR. Marlou Porch      Signed, Candee Furbish, MD  05/02/2021 3:41 PM    [facility]

## 2021-05-02 NOTE — Assessment & Plan Note (Signed)
EKG today shows atrial fibrillation heart rate 79 bpm.  We will go ahead and set him up for cardioversion.  Risk and benefits explained.  Only proceed.  Daughter present for discussion.  Continue with metoprolol 25 mg twice a day.  Continue with Pradaxa 150 mg twice a day.  He has not missed any dosing.  He is post atrial fibrillation ablation.  Dr. Quentin Ore.

## 2021-05-02 NOTE — Assessment & Plan Note (Signed)
Continue with atorvastatin 40 mg a day.  LDL 51.  Excellent.  ALT 10.  Strong family history of coronary artery disease.

## 2021-05-02 NOTE — Progress Notes (Signed)
Cardiology Office Note:    Date:  05/02/2021   ID:  JUANJESUS Clark, DOB [birth year], MRN [id number]  PCP:  Susy Frizzle, MD   [facility] Providers Cardiologist:  Candee Furbish, MD Electrophysiologist:  Vickie Epley, MD     Referring MD: Susy Frizzle, MD    History of Present Illness:    Antonio Clark is a 85 y.o. male here for the follow-up of atrial fibrillation prior ablation April 17, 2020.  No recurrence of arrhythmia.  On Pradaxa for stroke prophylaxis.  No bleeding.  No chest pain.  He has had several falls recently.  Feels some pulmonary congestion, coughing.  Has felt unstable, unbalanced.  No recent change in his hearing eyesight or proprioception.  Mostly happens in the morning, feels unsteady.  He has not been going to the [facility] and he does feel like his legs may be weaker.  His daughter is here with him, Antonio Clark.  Interestingly, in review of his records here he had an MRI of his brain on 04/24/2021 that showed global volume loss that is advanced with severe chronic small vessel disease with slightly disproportionate atrophy of the cerebellum.  This was ordered by Dr. Doren Custard secondary to ataxia unspecified.  And also seeing a visit on 03/07/2021 about 2 months ago with Noemi Chapel, NP for balance problems.  Today he does show signs of orthostatics that are positive.  133/80 laying down after standing for 3 minutes 108/66.  Heart rate remained fairly steady ranging from 77-86.  He also showed return of atrial fibrillation.  He saw Dr. Quentin Ore back in June 2022.  Past Medical History:  Diagnosis Date   Anal fissure    Arthritis    Atrial fibrillation (HCC)    Back pain    Borderline diabetic    Colon polyps    Dysrhythmia    a-fib   GERD (gastroesophageal reflux disease)    History of echocardiogram 07/ 07/ 2011   History of lithotripsy 1989   Hyperlipidemia    Hypertension    Kidney stones    Melanoma ([place]) 05/01/2019   right chest wall (12/20)    Pre-diabetes    Prostate CA ([place]) 2010   Sleep apnea    uses CPAP nightly   Squamous cell carcinoma of skin 05/23/1992   bowens-left parietal scalp (CX35FU)   Squamous cell carcinoma of skin 03/14/2008   in situ-left upper outer forehead-medial (CX35FU)   Squamous cell carcinoma of skin 03/14/2008   in situ-crown of scalp (Cx35FU)   Squamous cell carcinoma of skin 04/15/2011   in situ-right dorsal forearm (txpbx)   Squamous cell carcinoma of skin 10/16/2011   in situ-left sideburn   Squamous cell carcinoma of skin 03/11/2015   ka-left sideburn (CX35FU)   Squamous cell carcinoma of skin 03/11/2015   ka-left forearm (CX35FU)   Squamous cell carcinoma of skin 06/22/2018   in situ-left forearm, sup (txpbx)   Squamous cell carcinoma of skin 05/01/2019   in situ-mid anterior scalp    Squamous cell carcinoma of skin 05/01/2019   in situ-right upper arm    Past Surgical History:  Procedure Laterality Date   ATRIAL FIBRILLATION ABLATION N/A 04/17/2020   Procedure: [place];  Surgeon: Vickie Epley, MD;  Location: [facility];  Service: Cardiovascular;  Laterality: N/A;   AXILLARY SENTINEL NODE BIOPSY Right 08/23/2019   Procedure: SENTINEL LYMPH NODE BIOSPY RIGHT AXILLA;  Surgeon: Stark Klein, MD;  Location: [facility];  Service: General;  Laterality: Right;   CARDIOVERSION N/A 05/10/2020   Procedure: CARDIOVERSION;  Surgeon: Freada Bergeron, MD;  Location: [facility];  Service: Cardiovascular;  Laterality: N/A;   COLONOSCOPY  2002   FASCIECTOMY Left 03/28/2019   Procedure: SEGMENTAL FASCIECTOMY LEFT RING FINGER;  Surgeon: Daryll Brod, MD;  Location: [facility];  Service: Orthopedics;  Laterality: Left;  ANESTHESIA  AXILLARY BLOCK   INSERTION PROSTATE RADIATION SEED  8 4 2010   per Dr Rosana Hoes   MELANOMA EXCISION Right 06/22/2019   Procedure: WIDE LOCAL EXCISION RIGHT CHEST WALL MELANOMA, ADVANCEMENT FLAP CLOSURE FOR DEFECT  3X6 CM;  Surgeon: Stark Klein, MD;  Location: [facility];  Service: General;  Laterality: Right;   POLYPECTOMY  2002   THROAT SURGERY  1980   benign cyst   UPPER GASTROINTESTINAL ENDOSCOPY     URETHRAL STRICTURE DILATATION     also penile implant    Current Medications: Current Meds  Medication Sig   atorvastatin (LIPITOR) 40 MG tablet Take 40 mg by mouth daily.   cetirizine (ZYRTEC) 10 MG tablet Take 10 mg by mouth daily.   LORazepam (ATIVAN) 0.5 MG tablet TAKE 1 TABLET BY MOUTH EVERY DAY AS NEEDED FOR ANXIETY   metoprolol tartrate (LOPRESSOR) 25 MG tablet Take 0.5 tablets (12.5 mg total) by mouth 2 (two) times daily. (Patient taking differently: Take 25 mg by mouth 2 (two) times daily.)   omeprazole (PRILOSEC) 20 MG capsule Take 20 mg by mouth as needed (heartburn).   PRADAXA 150 MG CAPS capsule TAKE 1 CAPSULE BY MOUTH TWICE A DAY   [DISCONTINUED] Tamsulosin HCl (FLOMAX) 0.4 MG CAPS Take 0.4 mg by mouth daily.   [DISCONTINUED] valsartan (DIOVAN) 320 MG tablet Take 0.5 tablets (160 mg total) by mouth daily.     Allergies:   Patient has no known allergies.   Social History   Socioeconomic History   Marital status: Single    Spouse name: Not on file   Number of children: 0   Years of education: Not on file   Highest education level: Not on file  Occupational History   Occupation: retired  Tobacco Use   Smoking status: Never   Smokeless tobacco: Never  Vaping Use   Vaping Use: Never used  Substance and Sexual Activity   Alcohol use: Not Currently    Comment: social   Drug use: No   Sexual activity: Yes  Other Topics Concern   Not on file  Social History Narrative   Not on file   Social Determinants of Health   Financial Resource Strain: Low Risk    Difficulty of Paying Living Expenses: Not very hard  Food Insecurity: Not on file  Transportation Needs: Not on file  Physical Activity: Not on file  Stress: Not on file  Social Connections: Not on  file     Family History: The patient's family history includes Arrhythmia in his brother; Colon cancer (age of onset: 58) in his mother; Coronary artery disease in his brother; Depression in his mother; Diabetes in his brother and brother; Hearing loss in his brother; Heart disease in his brother and father; Heart failure in his brother; Hyperlipidemia in his sister; Hypertension in his brother, brother, brother, and father; Stroke in his mother and sister; Sudden death in his father; Suicidality in his sister. There is no history of Esophageal cancer, Pancreatic cancer, Prostate cancer, Rectal cancer, or Stomach cancer.  ROS:   Please see the history  of present illness.     All other systems reviewed and are negative.  EKGs/Labs/Other Studies Reviewed:    The following studies were reviewed today:  Echocardiogram 10/2017 - Left ventricle: The cavity size was normal. There was mild focal   basal hypertrophy of the septum. Systolic function was normal.   The estimated ejection fraction was in the range of 50% to 55%.   Wall motion was normal; there were no regional wall motion   abnormalities. Features are consistent with a pseudonormal left   ventricular filling pattern, with concomitant abnormal relaxation   and increased filling pressure (grade 2 diastolic dysfunction). - Aortic valve: Trileaflet; moderately thickened, mildly calcified   leaflets. There was mild regurgitation. Regurgitation pressure   half-time: 701 ms. - Aorta: Aortic root dimension: 38 mm (ED). - Aortic root: The aortic root was mildly dilated. - Mitral valve: Calcified annulus. There was trivial regurgitation. - Left atrium: The atrium was moderately dilated. - Right ventricle: The cavity size was mildly dilated. Wall   thickness was normal. - Atrial septum: There was increased thickness of the septum,   consistent with lipomatous hypertrophy. - Tricuspid valve: There was trivial regurgitation. - Pulmonic valve:  There was trivial regurgitation.      EKG:  EKG is  ordered today.  The ekg ordered today demonstrates AFIB 79 occasional PVC/aberrant conducted beat  Recent Labs: 03/07/2021: ALT 10; BUN 15; Creat 1.16; Hemoglobin 12.7; Platelets 143; Potassium 4.0; Sodium 141; TSH 1.52  Recent Lipid Panel    Component Value Date/Time   CHOL 112 12/17/2020 0827   TRIG 158 (H) 12/17/2020 0827   HDL 37 (L) 12/17/2020 0827   CHOLHDL 3.0 12/17/2020 0827   VLDL 34 (H) 01/18/2017 0813   LDLCALC 51 12/17/2020 0827     Risk Assessment/Calculations:          Physical Exam:    VS:  BP 120/84   Pulse 91   Ht 5\' 9"  (1.753 m)   Wt 181 lb (82.1 kg)   SpO2 96%   BMI 26.73 kg/m     Wt Readings from Last 3 Encounters:  05/02/21 181 lb (82.1 kg)  03/07/21 190 lb (86.2 kg)  01/31/21 185 lb (83.9 kg)     GEN:  Well nourished, well developed in no acute distress HEENT: Normal NECK: No JVD; No carotid bruits LYMPHATICS: No lymphadenopathy CARDIAC: Irreg, no murmurs, rubs, gallops RESPIRATORY: Mild crackles heard left base ABDOMEN: Soft, non-tender, non-distended MUSCULOSKELETAL:  No edema; No deformity  SKIN: Warm and dry NEUROLOGIC:  Alert and oriented x 3 PSYCHIATRIC:  Normal affect   ASSESSMENT:    1. Persistent atrial fibrillation ([place])   2. Diabetes mellitus with coincident hypertension ([place])   3. Orthostatic hypotension   4. Pure hypercholesterolemia    PLAN:    In order of problems listed above:  Atrial fibrillation ([place]) EKG today shows atrial fibrillation heart rate 79 bpm.  We will go ahead and set him up for cardioversion.  Risk and benefits explained.  Only proceed.  Daughter present for discussion.  Continue with metoprolol 25 mg twice a day.  Continue with Pradaxa 150 mg twice a day.  He has not missed any dosing.  He is post atrial fibrillation ablation.  Dr. Quentin Ore.  Diabetes mellitus with coincident hypertension (HCC) Last hemoglobin A1c 6.7.  Currently diet  controlled.  Continue with statin atorvastatin 40 mg once a day.  Orthostatic hypotension Likely playing a role in his unsteady type symptoms especially in  the morning hours.  Blood pressure dropped as described above.  We will go ahead and stop his valsartan 160 mg daily.  We will also stop his Flomax 0.4 mg daily.  He states that he rarely takes his Flomax regardless.  Continue with hydration. Okay to continue with metoprolol 25 mg twice a day for atrial fibrillation.  Hyperlipidemia Continue with atorvastatin 40 mg a day.  LDL 51.  Excellent.  ALT 10.  Strong family history of coronary artery disease.   I called up Dr. Samella Parr office to discuss her concerns over cough.  In the past, he has progressed to pneumonia.  He may need further treatment strategy.  I have set him up with an appointment on Monday morning at 10 AM.  They know that if symptoms progress to seek care with urgent care.  They were very appreciative.   41 minutes spent with patient   Medication Adjustments/Labs and Tests Ordered: Current medicines are reviewed at length with the patient today.  Concerns regarding medicines are outlined above.  Orders Placed This Encounter  Procedures   EKG 12-Lead   No orders of the defined types were placed in this encounter.   Patient Instructions  Medication Instructions:   STOP TAKING FLOMAX NOW  STOP TAKING VALSARTAN NOW  *If you need a refill on your cardiac medications before your next appointment, please call your pharmacy*    Testing/Procedures:   You are scheduled for a Cardioversion on Thursday 05/08/21 AT 12 PM with Dr. Gardiner Rhyme.  Please arrive at the [facility] (Main Entrance A) at [facility]: [address] [place], [place] [address] at 12 pm. (1 hour prior to procedure unless lab work is needed; if lab work is needed arrive 1.5 hours ahead)  DIET: Nothing to eat or drink after midnight except a sip of water with medications (see medication  instructions below)  FYI: For your safety, and to allow [place] to monitor your vital signs accurately during the surgery/procedure we request that   if you have artificial nails, gel coating, SNS etc. Please have those removed prior to your surgery/procedure. Not having the nail coverings /polish removed may result in cancellation or delay of your surgery/procedure.   Medication Instructions:  Continue your anticoagulant: PRADAXA You will need to continue your anticoagulant after your procedure until you are told by your Provider that it is safe to stop   Labs:   Come to: 05/08/21 AT 12 PM AT [facility]   (Lab option #3) your lab work will be done at the hospital prior to your procedure - you will need to arrive 1  hours ahead of your procedure  You must have a responsible person to drive you home and stay in the waiting area during your procedure. Failure to do so could result in cancellation.  Bring your insurance cards.  *Special Note: Every effort is made to have your procedure done on time. Occasionally there are emergencies that occur at the hospital that may cause delays. Please be patient if a delay does occur.     Follow-Up:  WITH AFIB CLINIC THE WEEK OF October 3rd post cardioversion per Dr. Marlou Porch   6 MONTHS IN THE OFFICE WITH DR. Marlou Porch      Signed, Candee Furbish, MD  05/02/2021 3:41 PM    [facility]

## 2021-05-02 NOTE — Patient Instructions (Addendum)
Medication Instructions:   STOP TAKING FLOMAX NOW  STOP TAKING VALSARTAN NOW  *If you need a refill on your cardiac medications before your next appointment, please call your pharmacy*    Testing/Procedures:   You are scheduled for a Cardioversion on Thursday 05/08/21 AT 12 PM with Dr. Gardiner Rhyme.  Please arrive at the [facility] (Main Entrance A) at [facility]: [address] [place], [place] [address] at 12 pm. (1 hour prior to procedure unless lab work is needed; if lab work is needed arrive 1.5 hours ahead)  DIET: Nothing to eat or drink after midnight except a sip of water with medications (see medication instructions below)  FYI: For your safety, and to allow [place] to monitor your vital signs accurately during the surgery/procedure we request that   if you have artificial nails, gel coating, SNS etc. Please have those removed prior to your surgery/procedure. Not having the nail coverings /polish removed may result in cancellation or delay of your surgery/procedure.   Medication Instructions:  Continue your anticoagulant: PRADAXA You will need to continue your anticoagulant after your procedure until you are told by your Provider that it is safe to stop   Labs:   Come to: 05/08/21 AT 12 PM AT [facility]   (Lab option #3) your lab work will be done at the hospital prior to your procedure - you will need to arrive 1  hours ahead of your procedure  You must have a responsible person to drive you home and stay in the waiting area during your procedure. Failure to do so could result in cancellation.  Bring your insurance cards.  *Special Note: Every effort is made to have your procedure done on time. Occasionally there are emergencies that occur at the hospital that may cause delays. Please be patient if a delay does occur.     Follow-Up:  WITH AFIB CLINIC THE WEEK OF October 3rd post cardioversion per Dr. Marlou Porch   6 MONTHS IN THE OFFICE WITH DR. Marlou Porch

## 2021-05-02 NOTE — Assessment & Plan Note (Signed)
Last hemoglobin A1c 6.7.  Currently diet controlled.  Continue with statin atorvastatin 40 mg once a day.

## 2021-05-02 NOTE — Assessment & Plan Note (Signed)
Likely playing a role in his unsteady type symptoms especially in the morning hours.  Blood pressure dropped as described above.  We will go ahead and stop his valsartan 160 mg daily.  We will also stop his Flomax 0.4 mg daily.  He states that he rarely takes his Flomax regardless.  Continue with hydration. Okay to continue with metoprolol 25 mg twice a day for atrial fibrillation.

## 2021-05-05 ENCOUNTER — Encounter: Payer: Self-pay | Admitting: Family Medicine

## 2021-05-05 ENCOUNTER — Ambulatory Visit (INDEPENDENT_AMBULATORY_CARE_PROVIDER_SITE_OTHER): Payer: 59 | Admitting: Family Medicine

## 2021-05-05 ENCOUNTER — Ambulatory Visit
Admission: RE | Admit: 2021-05-05 | Discharge: 2021-05-05 | Disposition: A | Discharge status: Home / Self Care | Payer: Medicare Other | Source: Ambulatory Visit | Attending: Family Medicine | Admitting: Family Medicine

## 2021-05-05 ENCOUNTER — Other Ambulatory Visit: Payer: Self-pay

## 2021-05-05 VITALS — BP 130/74 | HR 80 | Temp 98.1°F | Resp 14 | Ht 69.0 in | Wt 180.0 lb

## 2021-05-05 DIAGNOSIS — R062 Wheezing: Secondary | ICD-10-CM

## 2021-05-05 DIAGNOSIS — E1169 Type 2 diabetes mellitus with other specified complication: Secondary | ICD-10-CM | POA: Diagnosis not present

## 2021-05-05 DIAGNOSIS — R270 Ataxia, unspecified: Secondary | ICD-10-CM

## 2021-05-05 MED ORDER — PREDNISONE 20 MG PO TABS: ORAL_TABLET | ORAL | 0 refills | Status: DC

## 2021-05-05 NOTE — Progress Notes (Signed)
Subjective:    Patient ID: Antonio Clark, male    DOB: 10/31/1935, 85 y.o.   MRN: [id number]  Recently had MRI of brain due to balance issues: IMPRESSION:  Advanced global volume loss and severe chronic small vessel disease with slightly disproportionate atrophy of the cerebellum.    Patient is a very pleasant 85 year old gentleman who presents today complaining of chest congestion and cough.  He tried a Z-Pak over the weekend.  The cough is nonproductive.  There is no purulent sputum or chest pain or fevers.  His oxygen saturation today is normal and his temperature is normal.  However on examination he has bilateral wheezing in all 4 lung fields.  I do not appreciate any rhonchi's or rails but he certainly seems to be having bronchospasms.  He has no previous history of asthma and no risk factors for COPD for which she is aware.  Of note, he has been having some issues with balance and ataxia.  He has been following.  Recent MRI showed global atrophy but a disproportionate amount of atrophy in the cerebellum.  Unfortunately I feel that this is likely the cause of his balance issues and ataxia.  This is likely due to chronic microvascular disease from hypertension, hyperlipidemia, and age.  He also has had some mild short-term memory issues as well. Past Medical History:  Diagnosis Date   Anal fissure    Arthritis    Atrial fibrillation (HCC)    Back pain    Borderline diabetic    Colon polyps    Dysrhythmia    a-fib   GERD (gastroesophageal reflux disease)    History of echocardiogram 07/ 07/ 2011   History of lithotripsy 1989   Hyperlipidemia    Hypertension    Kidney stones    Melanoma ([place]) 05/01/2019   right chest wall (12/20)   Pre-diabetes    Prostate CA ([place]) 2010   Sleep apnea    uses CPAP nightly   Squamous cell carcinoma of skin 05/23/1992   bowens-left parietal scalp (CX35FU)   Squamous cell carcinoma of skin 03/14/2008   in situ-left upper outer forehead-medial  (CX35FU)   Squamous cell carcinoma of skin 03/14/2008   in situ-crown of scalp (Cx35FU)   Squamous cell carcinoma of skin 04/15/2011   in situ-right dorsal forearm (txpbx)   Squamous cell carcinoma of skin 10/16/2011   in situ-left sideburn   Squamous cell carcinoma of skin 03/11/2015   ka-left sideburn (CX35FU)   Squamous cell carcinoma of skin 03/11/2015   ka-left forearm (CX35FU)   Squamous cell carcinoma of skin 06/22/2018   in situ-left forearm, sup (txpbx)   Squamous cell carcinoma of skin 05/01/2019   in situ-mid anterior scalp    Squamous cell carcinoma of skin 05/01/2019   in situ-right upper arm   Past Surgical History:  Procedure Laterality Date   ATRIAL FIBRILLATION ABLATION N/A 04/17/2020   Procedure: [place];  Surgeon: Vickie Epley, MD;  Location: [facility];  Service: Cardiovascular;  Laterality: N/A;   AXILLARY SENTINEL NODE BIOPSY Right 08/23/2019   Procedure: SENTINEL LYMPH NODE BIOSPY RIGHT AXILLA;  Surgeon: Stark Klein, MD;  Location: [facility];  Service: General;  Laterality: Right;   CARDIOVERSION N/A 05/10/2020   Procedure: CARDIOVERSION;  Surgeon: Freada Bergeron, MD;  Location: [facility] ENDOSCOPY;  Service: Cardiovascular;  Laterality: N/A;   COLONOSCOPY  2002   FASCIECTOMY Left 03/28/2019   Procedure: SEGMENTAL FASCIECTOMY LEFT RING FINGER;  Surgeon: Daryll Brod, MD;  Location: [facility];  Service: Orthopedics;  Laterality: Left;  ANESTHESIA  AXILLARY BLOCK   INSERTION PROSTATE RADIATION SEED  8 4 2010   per Dr Rosana Hoes   MELANOMA EXCISION Right 06/22/2019   Procedure: WIDE LOCAL EXCISION RIGHT CHEST WALL MELANOMA, ADVANCEMENT FLAP CLOSURE FOR DEFECT 3X6 CM;  Surgeon: Stark Klein, MD;  Location: [facility];  Service: General;  Laterality: Right;   POLYPECTOMY  2002   THROAT SURGERY  1980   benign cyst   UPPER GASTROINTESTINAL ENDOSCOPY     URETHRAL STRICTURE DILATATION     also  penile implant   Current Outpatient Medications on File Prior to Visit  Medication Sig Dispense Refill   atorvastatin (LIPITOR) 40 MG tablet Take 40 mg by mouth daily.     azithromycin (ZITHROMAX) 250 MG tablet Take (2) tablets by mouth on day 1, then take (1) tablet by mouth on days 2-5. 6 tablet 0   cetirizine (ZYRTEC) 10 MG tablet Take 10 mg by mouth daily.     escitalopram (LEXAPRO) 10 MG tablet Take 10 mg by mouth daily. (Patient not taking: Reported on 05/02/2021)     ferrous sulfate 324 (65 Fe) MG TBEC TAKE 1 TABLET BY MOUTH EVERY DAY (Patient not taking: Reported on 05/02/2021) 90 tablet 1   LORazepam (ATIVAN) 0.5 MG tablet TAKE 1 TABLET BY MOUTH EVERY DAY AS NEEDED FOR ANXIETY 30 tablet 1   metoprolol tartrate (LOPRESSOR) 25 MG tablet Take 0.5 tablets (12.5 mg total) by mouth 2 (two) times daily. (Patient taking differently: Take 25 mg by mouth 2 (two) times daily.) 60 tablet 11   omeprazole (PRILOSEC) 20 MG capsule Take 20 mg by mouth as needed (heartburn).     pantoprazole (PROTONIX) 40 MG tablet TAKE 1 TABLET BY MOUTH EVERY DAY (Patient not taking: Reported on 05/02/2021) 90 tablet 0   PRADAXA 150 MG CAPS capsule TAKE 1 CAPSULE BY MOUTH TWICE A DAY 180 capsule 1   sucralfate (CARAFATE) 1 g tablet TAKE 1 TABLET BY MOUTH TWICE A DAY (Patient not taking: Reported on 05/02/2021) 60 tablet 3   No current facility-administered medications on file prior to visit.   No Known Allergies Social History   Socioeconomic History   Marital status: Single    Spouse name: Not on file   Number of children: 0   Years of education: Not on file   Highest education level: Not on file  Occupational History   Occupation: retired  Tobacco Use   Smoking status: Never   Smokeless tobacco: Never  Vaping Use   Vaping Use: Never used  Substance and Sexual Activity   Alcohol use: Not Currently    Comment: social   Drug use: No   Sexual activity: Yes  Other Topics Concern   Not on file  Social  History Narrative   Not on file   Social Determinants of Health   Financial Resource Strain: Low Risk    Difficulty of Paying Living Expenses: Not very hard  Food Insecurity: Not on file  Transportation Needs: Not on file  Physical Activity: Not on file  Stress: Not on file  Social Connections: Not on file  Intimate Partner Violence: Not on file   Family History  Problem Relation Age of Onset   Coronary artery disease Brother        3 brothers had CABG   Arrhythmia Brother    Diabetes Brother    Hearing loss Brother  Hypertension Brother    Heart disease Brother    Heart failure Brother    Hypertension Brother    Heart disease Father    Hypertension Father    Sudden death Father    Stroke Mother        cerebral hemorrage   Colon cancer Mother 34       small intestine   Depression Mother    Stroke Sister    Suicidality Sister    Diabetes Brother    Hypertension Brother    Hyperlipidemia Sister    Esophageal cancer Neg Hx    Pancreatic cancer Neg Hx    Prostate cancer Neg Hx    Rectal cancer Neg Hx    Stomach cancer Neg Hx      Review of Systems  Respiratory:  Positive for cough.   All other systems reviewed and are negative.     Objective:   Physical Exam Vitals reviewed.  Constitutional:      General: He is not in acute distress.    Appearance: He is well-developed. He is not diaphoretic.  HENT:     Head: Normocephalic and atraumatic.     Right Ear: External ear normal.     Left Ear: External ear normal.     Nose: Nose normal.     Mouth/Throat:     Pharynx: No oropharyngeal exudate.  Eyes:     General: No scleral icterus.       Right eye: No discharge.        Left eye: No discharge.     Conjunctiva/sclera: Conjunctivae normal.     Pupils: Pupils are equal, round, and reactive to light.  Neck:     Thyroid: No thyromegaly.     Vascular: No JVD.     Trachea: No tracheal deviation.  Cardiovascular:     Rate and Rhythm: Normal rate and regular  rhythm.     Heart sounds: Normal heart sounds. No murmur heard.   No friction rub. No gallop.  Pulmonary:     Effort: Pulmonary effort is normal. No respiratory distress.     Breath sounds: No stridor. Wheezing present. No rhonchi or rales.  Chest:     Chest wall: No tenderness.  Abdominal:     General: Bowel sounds are normal. There is no distension.     Palpations: Abdomen is soft. There is no mass.     Tenderness: There is no abdominal tenderness. There is no guarding or rebound.  Musculoskeletal:        General: No tenderness. Normal range of motion.     Cervical back: Normal range of motion and neck supple.  Lymphadenopathy:     Cervical: No cervical adenopathy.  Skin:    General: Skin is warm.     Coloration: Skin is not pale.     Findings: No erythema or rash.  Neurological:     Mental Status: He is alert and oriented to person, place, and time.     Cranial Nerves: No cranial nerve deficit.     Motor: No abnormal muscle tone.     Coordination: Coordination normal.     Deep Tendon Reflexes: Reflexes are normal and symmetric.  Psychiatric:        Behavior: Behavior normal.        Thought Content: Thought content normal.        Judgment: Judgment normal.       Assessment & Plan:  Wheezing - Plan: DG Chest 2 View  Type  2 diabetes mellitus with other specified complication, without long-term current use of insulin ([place]) - Plan: Hemoglobin A1c, COMPLETE METABOLIC PANEL WITH GFR, CBC with Differential/Platelet, Lipid panel  Ataxia Proceed with a chest x-ray to rule out an underlying pneumonia.  Meanwhile treat bronchospasms with prednisone.  I do not feel that he needs albuterol right now especially given his history of atrial fibrillation.  If the prednisone clears the wheezing and the chest x-ray is clear, no further work-up is necessary however if the chest x-ray shows any underlying interstitial lung disease, the patient may benefit from pulmonology consultation for  pulmonary function test.  I believe the ataxia is likely due to chronic microvascular disease affecting the cerebellum.  This will have to be managed by controlling his risk factors including hypertension hyperlipidemia and diabetes.  However I would recommend physical therapy and I will consult [facility] for this.

## 2021-05-06 ENCOUNTER — Other Ambulatory Visit: Payer: Self-pay | Admitting: Family Medicine

## 2021-05-06 ENCOUNTER — Other Ambulatory Visit: Payer: Self-pay | Admitting: *Deleted

## 2021-05-06 ENCOUNTER — Telehealth: Payer: Self-pay | Admitting: *Deleted

## 2021-05-06 ENCOUNTER — Encounter: Payer: Self-pay | Admitting: Family Medicine

## 2021-05-06 LAB — CBC WITH DIFFERENTIAL/PLATELET
Absolute Monocytes: 390 cells/uL (ref 200–950)
Basophils Absolute: 38 cells/uL (ref 0–200)
Basophils Relative: 0.6 %
Eosinophils Absolute: 160 cells/uL (ref 15–500)
Eosinophils Relative: 2.5 %
HCT: 40.2 % (ref 38.5–50.0)
Hemoglobin: 13.3 g/dL (ref 13.2–17.1)
Lymphs Abs: 1619 cells/uL (ref 850–3900)
MCH: 28.7 pg (ref 27.0–33.0)
MCHC: 33.1 g/dL (ref 32.0–36.0)
MCV: 86.8 fL (ref 80.0–100.0)
MPV: 9.7 fL (ref 7.5–12.5)
Monocytes Relative: 6.1 %
Neutro Abs: 4192 cells/uL (ref 1500–7800)
Neutrophils Relative %: 65.5 %
Platelets: 159 10*3/uL (ref 140–400)
RBC: 4.63 10*6/uL (ref 4.20–5.80)
RDW: 13.4 % (ref 11.0–15.0)
Total Lymphocyte: 25.3 %
WBC: 6.4 10*3/uL (ref 3.8–10.8)

## 2021-05-06 LAB — LIPID PANEL
Cholesterol: 108 mg/dL (ref <200)
HDL: 43 mg/dL (ref >=40)
LDL Cholesterol (Calc): 45 mg/dL (calc)
Non-HDL Cholesterol (Calc): 65 mg/dL (calc) (ref <130)
Total CHOL/HDL Ratio: 2.5 (calc) (ref <5.0)
Triglycerides: 116 mg/dL (ref <150)

## 2021-05-06 LAB — COMPLETE METABOLIC PANEL WITH GFR
AG Ratio: 1.7 (calc) (ref 1.0–2.5)
ALT: 11 U/L (ref 9–46)
AST: 16 U/L (ref 10–35)
Albumin: 4.4 g/dL (ref 3.6–5.1)
Alkaline phosphatase (APISO): 68 U/L (ref 35–144)
BUN/Creatinine Ratio: 14 (calc) (ref 6–22)
BUN: 18 mg/dL (ref 7–25)
CO2: 27 mmol/L (ref 20–32)
Calcium: 9.5 mg/dL (ref 8.6–10.3)
Chloride: 105 mmol/L (ref 98–110)
Creat: 1.26 mg/dL — ABNORMAL HIGH (ref 0.70–1.22)
Globulin: 2.6 g/dL (calc) (ref 1.9–3.7)
Glucose, Bld: 153 mg/dL — ABNORMAL HIGH (ref 65–99)
Potassium: 3.9 mmol/L (ref 3.5–5.3)
Sodium: 143 mmol/L (ref 135–146)
Total Bilirubin: 1 mg/dL (ref 0.2–1.2)
Total Protein: 7 g/dL (ref 6.1–8.1)
eGFR: 57 mL/min/{1.73_m2} — ABNORMAL LOW (ref >=60)

## 2021-05-06 LAB — HEMOGLOBIN A1C
Hgb A1c MFr Bld: 6.6 % of total Hgb — ABNORMAL HIGH (ref <5.7)
Mean Plasma Glucose: 143 mg/dL
eAG (mmol/L): 7.9 mmol/L

## 2021-05-06 MED ORDER — SITAGLIPTIN PHOSPHATE 100 MG PO TABS: 100.0000 mg | ORAL_TABLET | Freq: Every day | ORAL | 1 refills | Status: DC

## 2021-05-06 NOTE — Telephone Encounter (Signed)
-----   Message from Lavonna Monarch, MD sent at 05/06/2021  6:36 AM EDT ----- Schedule surgery with Dr. Darene Lamer

## 2021-05-06 NOTE — Telephone Encounter (Signed)
Pathology to patient- surgery appointment already scheduled.

## 2021-05-08 ENCOUNTER — Ambulatory Visit (HOSPITAL_COMMUNITY): Payer: Medicare Other | Admitting: Anesthesiology

## 2021-05-08 ENCOUNTER — Ambulatory Visit (HOSPITAL_COMMUNITY)
Admission: RE | Admit: 2021-05-08 | Discharge: 2021-05-08 | Disposition: A | Discharge status: Home / Self Care | Payer: Medicare Other | Source: Ambulatory Visit | Attending: Cardiology | Admitting: Cardiology

## 2021-05-08 ENCOUNTER — Other Ambulatory Visit: Payer: Self-pay

## 2021-05-08 ENCOUNTER — Encounter (HOSPITAL_COMMUNITY)
Admission: RE | Disposition: A | Discharge status: Home / Self Care | Payer: Self-pay | Source: Ambulatory Visit | Attending: Cardiology

## 2021-05-08 ENCOUNTER — Encounter (HOSPITAL_COMMUNITY): Payer: Self-pay | Admitting: Cardiology

## 2021-05-08 DIAGNOSIS — Z800 Family history of malignant neoplasm of digestive organs: Secondary | ICD-10-CM | POA: Insufficient documentation

## 2021-05-08 DIAGNOSIS — Z8546 Personal history of malignant neoplasm of prostate: Secondary | ICD-10-CM | POA: Insufficient documentation

## 2021-05-08 DIAGNOSIS — I951 Orthostatic hypotension: Secondary | ICD-10-CM | POA: Diagnosis not present

## 2021-05-08 DIAGNOSIS — E7800 Pure hypercholesterolemia, unspecified: Secondary | ICD-10-CM | POA: Diagnosis not present

## 2021-05-08 DIAGNOSIS — Z7901 Long term (current) use of anticoagulants: Secondary | ICD-10-CM | POA: Insufficient documentation

## 2021-05-08 DIAGNOSIS — Z8349 Family history of other endocrine, nutritional and metabolic diseases: Secondary | ICD-10-CM | POA: Diagnosis not present

## 2021-05-08 DIAGNOSIS — I4819 Other persistent atrial fibrillation: Secondary | ICD-10-CM | POA: Diagnosis not present

## 2021-05-08 DIAGNOSIS — Z8249 Family history of ischemic heart disease and other diseases of the circulatory system: Secondary | ICD-10-CM | POA: Insufficient documentation

## 2021-05-08 DIAGNOSIS — Z833 Family history of diabetes mellitus: Secondary | ICD-10-CM | POA: Diagnosis not present

## 2021-05-08 DIAGNOSIS — E119 Type 2 diabetes mellitus without complications: Secondary | ICD-10-CM | POA: Insufficient documentation

## 2021-05-08 DIAGNOSIS — Z823 Family history of stroke: Secondary | ICD-10-CM | POA: Diagnosis not present

## 2021-05-08 DIAGNOSIS — R270 Ataxia, unspecified: Secondary | ICD-10-CM | POA: Insufficient documentation

## 2021-05-08 DIAGNOSIS — Z79899 Other long term (current) drug therapy: Secondary | ICD-10-CM | POA: Diagnosis not present

## 2021-05-08 HISTORY — PX: CARDIOVERSION: SHX1299

## 2021-05-08 SURGERY — CARDIOVERSION
Anesthesia: General

## 2021-05-08 MED ORDER — SODIUM CHLORIDE 0.9 % IV SOLN
INTRAVENOUS | Status: AC | PRN
  Administered 2021-05-08: 500 mL via INTRAVENOUS

## 2021-05-08 MED ORDER — LIDOCAINE 2% (20 MG/ML) 5 ML SYRINGE
INTRAMUSCULAR | Status: DC | PRN
  Administered 2021-05-08: 20 mg via INTRAVENOUS

## 2021-05-08 MED ORDER — PROPOFOL 10 MG/ML IV BOLUS
INTRAVENOUS | Status: DC | PRN
  Administered 2021-05-08: 20 mg via INTRAVENOUS
  Administered 2021-05-08: 60 mg via INTRAVENOUS

## 2021-05-08 NOTE — Anesthesia Procedure Notes (Signed)
Procedure Name: General with mask airway Date/Time: 05/08/2021 3:51 PM Performed by: Renato Shin, CRNA Pre-anesthesia Checklist: Patient identified, Emergency Drugs available, Suction available and Patient being monitored Patient Re-evaluated:Patient Re-evaluated prior to induction Oxygen Delivery Method: Ambu bag Preoxygenation: Pre-oxygenation with 100% oxygen Induction Type: IV induction Ventilation: Mask ventilation without difficulty Placement Confirmation: positive ETCO2 and breath sounds checked- equal and bilateral Dental Injury: Teeth and Oropharynx as per pre-operative assessment

## 2021-05-08 NOTE — Transfer of Care (Signed)
Immediate Anesthesia Transfer of Care Note  Patient: Antonio Clark  Procedure(s) Performed: CARDIOVERSION  Patient Location: PACU  Anesthesia Type:General  Level of Consciousness: drowsy and patient cooperative  Airway & Oxygen Therapy: Patient Spontanous Breathing  Post-op Assessment: Report given to RN and Post -op Vital signs reviewed and stable  Post vital signs: Reviewed and stable  Last Vitals:  Vitals Value Taken Time  BP    Temp    Pulse 39 05/08/21 1400  Resp 21 05/08/21 1400  SpO2 100 % 05/08/21 1400  Vitals shown include unvalidated device data.  Last Pain:  Vitals:   05/08/21 1310  TempSrc: Temporal  PainSc: 0-No pain         Complications: No notable events documented.

## 2021-05-08 NOTE — Interval H&P Note (Signed)
History and Physical Interval Note:  05/08/2021 1:43 PM  Antonio Clark  has presented today for surgery, with the diagnosis of AFIB.  The various methods of treatment have been discussed with the patient and family. After consideration of risks, benefits and other options for treatment, the patient has consented to  Procedure(s): CARDIOVERSION (N/A) as a surgical intervention.  The patient's history has been reviewed, patient examined, no change in status, stable for surgery.  I have reviewed the patient's chart and labs.  Questions were answered to the patient's satisfaction.     Donato Heinz

## 2021-05-08 NOTE — Discharge Instructions (Signed)
Electrical Cardioversion  Electrical cardioversion is the delivery of a jolt of electricity to restore a normal rhythm to the heart. A rhythm that is too fast or is not regular keeps the heart from pumping well. In this procedure, sticky patches or metal paddles are placed on the chest to deliver electricity to the heart from a device.  What can I expect after the procedure?  Your blood pressure, heart rate, breathing rate, and blood oxygen level will be monitored until you leave the hospital or clinic.  Your heart rhythm will be watched to make sure it does not change.  You may have some redness on the skin where the shocks were given.If this occurs, can use hydrocortisone cream or Aloe vera.  Follow these instructions at home:  Do not drive for 24 hours if you were given a sedative during your procedure.  Take over-the-counter and prescription medicines only as told by your health care provider.  Ask your health care provider how to check your pulse. Check it often.  Rest for 48 hours after the procedure or as told by your health care provider.  Avoid or limit your caffeine use as told by your health care provider.  Keep all follow-up visits as told by your health care provider. This is important.  Contact a health care provider if:  You feel like your heart is beating too quickly or your pulse is not regular.  You have a serious muscle cramp that does not go away.  Get help right away if:  You have discomfort in your chest.  You are dizzy or you feel faint.  You have trouble breathing or you are short of breath.  Your speech is slurred.  You have trouble moving an arm or leg on one side of your body.  Your fingers or toes turn cold or blue.  Summary  Electrical cardioversion is the delivery of a jolt of electricity to restore a normal rhythm to the heart.  This procedure may be done right away in an emergency or may be a scheduled procedure if the condition is not  an emergency.  Generally, this is a safe procedure.  After the procedure, check your pulse often as told by your health care provider.  This information is not intended to replace advice given to you by your health care provider. Make sure you discuss any questions you have with your health care provider. Document Revised: 02/27/2019 Document Reviewed: 02/27/2019 Elsevier Patient Education  2021 Elsevier Inc.  

## 2021-05-08 NOTE — CV Procedure (Signed)
Procedure:   DCCV  Indication:  Symptomatic atrial fibrillation  Procedure Note:  The patient signed informed consent.  They have had had therapeutic anticoagulation with Pradaxa greater than 3 weeks.  Anesthesia was administered by Dr. Ola Spurr.  Adequate airway was maintained throughout and vital followed per protocol.  They were cardioverted x 2 with 200J of biphasic synchronized energy.  They converted to NSR.  Following first shock, remained in Afib.  For second shock, pressure was applied to anterior pad and converted to NSR with PACs, rate 50-60s.  There were no apparent complications.  The patient had normal neuro status and respiratory status post procedure with vitals stable as recorded elsewhere.    Follow up:  They will continue on current medical therapy and follow up with cardiology as scheduled.  Oswaldo Milian, MD 05/08/2021 1:57 PM

## 2021-05-08 NOTE — Anesthesia Preprocedure Evaluation (Signed)
Anesthesia Evaluation  Patient identified by MRN, date of birth, ID band Patient awake    Reviewed: Allergy & Precautions, NPO status , Patient's Chart, lab work & pertinent test results  Airway Mallampati: II  TM Distance: >3 FB Neck ROM: Full    Dental   Pulmonary sleep apnea ,    breath sounds clear to auscultation       Cardiovascular hypertension, Pt. on medications and Pt. on home beta blockers + CAD  + dysrhythmias Atrial Fibrillation  Rhythm:Regular Rate:Normal     Neuro/Psych negative neurological ROS     GI/Hepatic Neg liver ROS, GERD  ,  Endo/Other  diabetes  Renal/GU Renal disease     Musculoskeletal   Abdominal   Peds  Hematology   Anesthesia Other Findings   Reproductive/Obstetrics                             Anesthesia Physical Anesthesia Plan  ASA: 3  Anesthesia Plan: General   Post-op Pain Management:    Induction: Intravenous  PONV Risk Score and Plan: Treatment may vary due to age or medical condition  Airway Management Planned: Natural Airway and Mask  Additional Equipment: None  Intra-op Plan:   Post-operative Plan:   Informed Consent: I have reviewed the patients History and Physical, chart, labs and discussed the procedure including the risks, benefits and alternatives for the proposed anesthesia with the patient or authorized representative who has indicated his/her understanding and acceptance.       Plan Discussed with:   Anesthesia Plan Comments:         Anesthesia Quick Evaluation

## 2021-05-09 ENCOUNTER — Encounter (HOSPITAL_COMMUNITY): Payer: Self-pay | Admitting: Cardiology

## 2021-05-09 NOTE — Anesthesia Postprocedure Evaluation (Signed)
Anesthesia Post Note  Patient: Antonio Clark  Procedure(s) Performed: CARDIOVERSION     Patient location during evaluation: PACU Anesthesia Type: General Level of consciousness: awake and alert Pain management: pain level controlled Vital Signs Assessment: post-procedure vital signs reviewed and stable Respiratory status: spontaneous breathing, nonlabored ventilation, respiratory function stable and patient connected to nasal cannula oxygen Cardiovascular status: blood pressure returned to baseline and stable Postop Assessment: no apparent nausea or vomiting Anesthetic complications: no   No notable events documented.  Last Vitals:  Vitals:   05/08/21 1413 05/08/21 1423  BP: 122/83 (!) 147/59  Pulse: (!) 56 (!) 57  Resp: 20 19  Temp:    SpO2: 97% 95%    Last Pain:  Vitals:   05/08/21 1423  TempSrc:   PainSc: 0-No pain                 Tiajuana Amass

## 2021-05-15 ENCOUNTER — Encounter (HOSPITAL_COMMUNITY): Payer: Self-pay | Admitting: Nurse Practitioner

## 2021-05-15 ENCOUNTER — Other Ambulatory Visit: Payer: Self-pay

## 2021-05-15 ENCOUNTER — Ambulatory Visit (HOSPITAL_COMMUNITY)
Admission: RE | Admit: 2021-05-15 | Discharge: 2021-05-15 | Disposition: A | Discharge status: Home / Self Care | Payer: Medicare Other | Source: Ambulatory Visit | Attending: Nurse Practitioner | Admitting: Nurse Practitioner

## 2021-05-15 VITALS — BP 148/70 | HR 66 | Ht 69.0 in | Wt 176.8 lb

## 2021-05-15 DIAGNOSIS — Z8249 Family history of ischemic heart disease and other diseases of the circulatory system: Secondary | ICD-10-CM | POA: Diagnosis not present

## 2021-05-15 DIAGNOSIS — I480 Paroxysmal atrial fibrillation: Secondary | ICD-10-CM | POA: Insufficient documentation

## 2021-05-15 DIAGNOSIS — Z833 Family history of diabetes mellitus: Secondary | ICD-10-CM | POA: Insufficient documentation

## 2021-05-15 DIAGNOSIS — I2510 Atherosclerotic heart disease of native coronary artery without angina pectoris: Secondary | ICD-10-CM | POA: Insufficient documentation

## 2021-05-15 DIAGNOSIS — I4819 Other persistent atrial fibrillation: Secondary | ICD-10-CM

## 2021-05-15 DIAGNOSIS — Z79899 Other long term (current) drug therapy: Secondary | ICD-10-CM | POA: Diagnosis not present

## 2021-05-15 DIAGNOSIS — E119 Type 2 diabetes mellitus without complications: Secondary | ICD-10-CM | POA: Diagnosis not present

## 2021-05-15 DIAGNOSIS — I10 Essential (primary) hypertension: Secondary | ICD-10-CM | POA: Insufficient documentation

## 2021-05-15 DIAGNOSIS — D6869 Other thrombophilia: Secondary | ICD-10-CM

## 2021-05-15 MED ORDER — METOPROLOL TARTRATE 25 MG PO TABS: 25.0000 mg | ORAL_TABLET | Freq: Two times a day (BID) | ORAL | Status: DC

## 2021-05-15 NOTE — Progress Notes (Signed)
Primary Care Physician: Susy Frizzle, MD Referring Physician: Dr. Marlou Porch Cardiologist: Dr. Marlou Porch EP: Dr. Clydene Fake SIDHANT HELDERMAN is a 85 y.o. male with a h/o DM, HTN, CAD,paroxysmal afib that is in the afib clinic for recent successful cardioversion, scheduled by Dr. Marlou Porch. He is s/p ablation 04/17/20. His ekg today shows SR. He is getting over an URI for which he had to take prednisone. He feels improved.   Today, he denies symptoms of palpitations, chest pain, shortness of breath, orthopnea, PND, lower extremity edema, dizziness, presyncope, syncope, or neurologic sequela. The patient is tolerating medications without difficulties and is otherwise without complaint today.   Past Medical History:  Diagnosis Date   Anal fissure    Arthritis    Atrial fibrillation ([place])    Back pain    Colon polyps    Diabetes mellitus type 2 with complications ([place])    Dysrhythmia    a-fib   GERD (gastroesophageal reflux disease)    History of echocardiogram 07/ 07/ 2011   History of lithotripsy 1989   Hyperlipidemia    Hypertension    Kidney stones    Melanoma ([place]) 05/01/2019   right chest wall (12/20)   Prostate CA ([place]) 2010   Sleep apnea    uses CPAP nightly   Squamous cell carcinoma of skin 05/23/1992   bowens-left parietal scalp (CX35FU)   Squamous cell carcinoma of skin 03/14/2008   in situ-left upper outer forehead-medial (CX35FU)   Squamous cell carcinoma of skin 03/14/2008   in situ-crown of scalp (Cx35FU)   Squamous cell carcinoma of skin 04/15/2011   in situ-right dorsal forearm (txpbx)   Squamous cell carcinoma of skin 10/16/2011   in situ-left sideburn   Squamous cell carcinoma of skin 03/11/2015   ka-left sideburn (CX35FU)   Squamous cell carcinoma of skin 03/11/2015   ka-left forearm (CX35FU)   Squamous cell carcinoma of skin 06/22/2018   in situ-left forearm, sup (txpbx)   Squamous cell carcinoma of skin 05/01/2019   in situ-mid anterior scalp    Squamous  cell carcinoma of skin 05/01/2019   in situ-right upper arm   Past Surgical History:  Procedure Laterality Date   ATRIAL FIBRILLATION ABLATION N/A 04/17/2020   Procedure: [place];  Surgeon: Vickie Epley, MD;  Location: [facility];  Service: Cardiovascular;  Laterality: N/A;   AXILLARY SENTINEL NODE BIOPSY Right 08/23/2019   Procedure: SENTINEL LYMPH NODE BIOSPY RIGHT AXILLA;  Surgeon: Stark Klein, MD;  Location: [place] [facility];  Service: General;  Laterality: Right;   CARDIOVERSION N/A 05/10/2020   Procedure: CARDIOVERSION;  Surgeon: Freada Bergeron, MD;  Location: [facility] ENDOSCOPY;  Service: Cardiovascular;  Laterality: N/A;   CARDIOVERSION N/A 05/08/2021   Procedure: CARDIOVERSION;  Surgeon: Donato Heinz, MD;  Location: [facility] ENDOSCOPY;  Service: Cardiovascular;  Laterality: N/A;   COLONOSCOPY  2002   FASCIECTOMY Left 03/28/2019   Procedure: SEGMENTAL FASCIECTOMY LEFT RING FINGER;  Surgeon: Daryll Brod, MD;  Location: [place];  Service: Orthopedics;  Laterality: Left;  ANESTHESIA  AXILLARY BLOCK   INSERTION PROSTATE RADIATION SEED  8 4 2010   per Dr Rosana Hoes   MELANOMA EXCISION Right 06/22/2019   Procedure: WIDE LOCAL EXCISION RIGHT CHEST WALL MELANOMA, ADVANCEMENT FLAP CLOSURE FOR DEFECT 3X6 CM;  Surgeon: Stark Klein, MD;  Location: [place];  Service: General;  Laterality: Right;   POLYPECTOMY  2002   [place]   benign  cyst   UPPER GASTROINTESTINAL ENDOSCOPY     URETHRAL STRICTURE DILATATION     also penile implant    Current Outpatient Medications  Medication Sig Dispense Refill   atorvastatin (LIPITOR) 40 MG tablet Take 40 mg by mouth daily.     cetirizine (ZYRTEC) 10 MG tablet Take 10 mg by mouth daily as needed for allergies.     fluticasone (FLONASE) 50 MCG/ACT nasal spray Place 1 spray into both nostrils daily as needed for allergies or rhinitis.     LORazepam (ATIVAN) 0.5 MG  tablet TAKE 1 TABLET BY MOUTH EVERY DAY AS NEEDED FOR ANXIETY 30 tablet 1   metoprolol tartrate (LOPRESSOR) 25 MG tablet Take 0.5 tablets (12.5 mg total) by mouth 2 (two) times daily. (Patient taking differently: Take 25 mg by mouth 2 (two) times daily.) 60 tablet 11   pantoprazole (PROTONIX) 40 MG tablet TAKE 1 TABLET BY MOUTH EVERY DAY (Patient not taking: Reported on 05/06/2021) 90 tablet 0   PRADAXA 150 MG CAPS capsule TAKE 1 CAPSULE BY MOUTH TWICE A DAY 180 capsule 1   predniSONE (DELTASONE) 20 MG tablet 3 tabs poqday 1-2, 2 tabs poqday 3-4, 1 tab poqday 5-6 12 tablet 0   sitaGLIPtin (JANUVIA) 100 MG tablet Take 1 tablet (100 mg total) by mouth daily. 90 tablet 1   sucralfate (CARAFATE) 1 g tablet TAKE 1 TABLET BY MOUTH TWICE A DAY (Patient not taking: Reported on 05/06/2021) 60 tablet 3   No current facility-administered medications for this encounter.    No Known Allergies  Social History   Socioeconomic History   Marital status: Single    Spouse name: Not on file   Number of children: 0   Years of education: Not on file   Highest education level: Not on file  Occupational History   Occupation: retired  Tobacco Use   Smoking status: Never   Smokeless tobacco: Never  Vaping Use   Vaping Use: Never used  Substance and Sexual Activity   Alcohol use: Not Currently    Comment: social   Drug use: No   Sexual activity: Yes  Other Topics Concern   Not on file  Social History Narrative   Not on file   Social Determinants of Health   Financial Resource Strain: Not on file  Food Insecurity: Not on file  Transportation Needs: Not on file  Physical Activity: Not on file  Stress: Not on file  Social Connections: Not on file  Intimate Partner Violence: Not on file    Family History  Problem Relation Age of Onset   Coronary artery disease Brother        3 brothers had CABG   Arrhythmia Brother    Diabetes Brother    Hearing loss Brother    Hypertension Brother    Heart  disease Brother    Heart failure Brother    Hypertension Brother    Heart disease Father    Hypertension Father    Sudden death Father    Stroke Mother        cerebral hemorrage   Colon cancer Mother 73       small intestine   Depression Mother    Stroke Sister    Suicidality Sister    Diabetes Brother    Hypertension Brother    Hyperlipidemia Sister    Esophageal cancer Neg Hx    Pancreatic cancer Neg Hx    Prostate cancer Neg Hx    Rectal cancer Neg Hx  Stomach cancer Neg Hx     ROS- All systems are reviewed and negative except as per the HPI above  Physical Exam: There were no vitals filed for this visit. Wt Readings from Last 3 Encounters:  05/08/21 81.6 kg  05/05/21 81.6 kg  05/02/21 82.1 kg    Labs: Lab Results  Component Value Date   NA 143 05/05/2021   K 3.9 05/05/2021   CL 105 05/05/2021   CO2 27 05/05/2021   GLUCOSE 153 (H) 05/05/2021   BUN 18 05/05/2021   CREATININE 1.26 (H) 05/05/2021   CALCIUM 9.5 05/05/2021   Lab Results  Component Value Date   INR 1.0 03/06/2009   Lab Results  Component Value Date   CHOL 108 05/05/2021   HDL 43 05/05/2021   LDLCALC 45 05/05/2021   TRIG 116 05/05/2021     GEN- The patient is well appearing, alert and oriented x 3 today.   Head- normocephalic, atraumatic Eyes-  Sclera clear, conjunctiva pink Ears- hearing intact Oropharynx- clear Neck- supple, no JVP Lymph- no cervical lymphadenopathy Lungs- Clear to ausculation bilaterally, normal work of breathing Heart- Regular rate and rhythm, no murmurs, rubs or gallops, PMI not laterally displaced GI- soft, NT, ND, + BS Extremities- no clubbing, cyanosis, or edema MS- no significant deformity or atrophy Skin- no rash or lesion Psych- euthymic mood, full affect Neuro- strength and sensation are intact  EKG-NSR at 66 bpm, pr int 176 ms. qrs int 90 ms, qtc 421 ms    Assessment and Plan:  1. Afib  Successful cardioversion  In SR today   2. CHA2DS2VASc   score of 4 Continue pradaxa 150 mg bid   3. CAD No anginal symptoms   F/u with Dr. Kathe Mariner as scheduled   Geroge Baseman. Mckaylah Bettendorf, [facility] [address] [place], [place] [address] [phone]

## 2021-05-16 ENCOUNTER — Encounter: Payer: Self-pay | Admitting: Dermatology

## 2021-05-16 ENCOUNTER — Other Ambulatory Visit: Payer: Self-pay

## 2021-05-16 ENCOUNTER — Telehealth: Payer: Self-pay | Admitting: Pharmacist

## 2021-05-16 MED ORDER — METOPROLOL TARTRATE 25 MG PO TABS: 25.0000 mg | ORAL_TABLET | Freq: Two times a day (BID) | ORAL | 11 refills | Status: DC

## 2021-05-16 MED ORDER — LINAGLIPTIN 5 MG PO TABS: 5.0000 mg | ORAL_TABLET | Freq: Every day | ORAL | 3 refills | Status: DC

## 2021-05-16 NOTE — Progress Notes (Signed)
Follow-Up Visit   Subjective  Antonio Clark is a 85 y.o. male who presents for the following: Annual Exam (Patient here today for yearly skin check. Patient has two lesion on his left should x 4 months no bleeding, per patient the lesions are sore. Check lesion on frontal scalp x 3-4 months comes and goes no bleeding. Personal history of melanoma and non mole skin cancer. No family history of atypical moles, melanoma or non mole skin.).  Multiple new crusted areas, general skin examination Location:  Duration:  Quality:  Associated Signs/Symptoms: Modifying Factors:  Severity:  Timing: Context:   Objective  Well appearing patient in no apparent distress; mood and affect are within normal limits. Waist up skin examination: No new or recurrent melanoma or atypical pigmented lesions.  For probable nonmelanoma skin cancers will be biopsied (he has several other small crusts which are likely also nonmelanoma cancer).  Multiple actinic keratoses; treatment deferred.  Mid Forehead Heaped up 1.2 cm crust       Mid Frontal Scalp 1 cm waxy pink crust       Left Shoulder - Anterior Pearly centrally eroded 1.2 cm nodule       Mid Parietal Scalp 1 cm pink-Kluck crust with focal erosion       All skin waist up examined.   Assessment & Plan    Neoplasm of uncertain behavior of skin (4) Mid Forehead  Skin / nail biopsy Type of biopsy: tangential   Informed consent: discussed and consent obtained   Timeout: patient name, date of birth, surgical site, and procedure verified   Anesthesia: the lesion was anesthetized in a standard fashion   Anesthetic:  1% lidocaine w/ epinephrine 1-100,000 local infiltration Instrument used: flexible razor blade   Hemostasis achieved with: ferric subsulfate   Outcome: patient tolerated procedure well   Post-procedure details: wound care instructions given    Specimen 1 - Surgical pathology Differential Diagnosis: scc vs  bcc  Check Margins: No  Mid Frontal Scalp  Skin / nail biopsy Type of biopsy: tangential   Informed consent: discussed and consent obtained   Timeout: patient name, date of birth, surgical site, and procedure verified   Anesthesia: the lesion was anesthetized in a standard fashion   Anesthetic:  1% lidocaine w/ epinephrine 1-100,000 local infiltration Instrument used: flexible razor blade   Hemostasis achieved with: ferric subsulfate   Outcome: patient tolerated procedure well   Post-procedure details: wound care instructions given    Specimen 2 - Surgical pathology Differential Diagnosis: scc vs bcc  Check Margins: No  Left Shoulder - Anterior  Skin / nail biopsy Type of biopsy: tangential   Informed consent: discussed and consent obtained   Timeout: patient name, date of birth, surgical site, and procedure verified   Anesthesia: the lesion was anesthetized in a standard fashion   Anesthetic:  1% lidocaine w/ epinephrine 1-100,000 local infiltration Instrument used: flexible razor blade   Hemostasis achieved with: ferric subsulfate   Outcome: patient tolerated procedure well   Post-procedure details: wound care instructions given    Specimen 3 - Surgical pathology Differential Diagnosis: scc vs bcc  Check Margins: No  Mid Parietal Scalp  Skin / nail biopsy Type of biopsy: tangential   Informed consent: discussed and consent obtained   Timeout: patient name, date of birth, surgical site, and procedure verified   Anesthesia: the lesion was anesthetized in a standard fashion   Anesthetic:  1% lidocaine w/ epinephrine 1-100,000 local infiltration Instrument used:  flexible razor blade   Hemostasis achieved with: ferric subsulfate   Outcome: patient tolerated procedure well   Post-procedure details: wound care instructions given    Specimen 4 - Surgical pathology Differential Diagnosis: scc vs bcc  Check Margins: No  Encounter for screening for malignant neoplasm  of skin  Annual skin examination.      I, Lavonna Monarch, MD, have reviewed all documentation for this visit.  The documentation on 05/16/21 for the exam, diagnosis, procedures, and orders are all accurate and complete.

## 2021-05-16 NOTE — Progress Notes (Signed)
Chronic Care Management Pharmacy Assistant   Name: Antonio Clark  MRN: [id number] DOB: 1935/11/23  Reason for Encounter: Disease State For HTN.    Conditions to be addressed/monitored: HTN, CAD, Afib, HLD, borderline DM.  Recent office visits:  05/05/21 Dr. Dennard Schaumann For wheezing. STARTED Prednisone pack..  Recent consult visits:  05/02/21 Cardiology Jerline Pain, MD. STOPPED Tamsulosin and Valsartan. 04/30/21 Nino Parsley, MD. For annual exam. No medication changes.  Hospital visits:  05/08/21 [facility] ( 2 Hours) Donato Heinz, MD. For CARDIOVERSION. No medication changes.  Medications: Outpatient Encounter Medications as of 05/16/2021  Medication Sig   atorvastatin (LIPITOR) 40 MG tablet Take 40 mg by mouth daily.   cetirizine (ZYRTEC) 10 MG tablet Take 10 mg by mouth daily as needed for allergies.   fluticasone (FLONASE) 50 MCG/ACT nasal spray Place 1 spray into both nostrils daily as needed for allergies or rhinitis.   LORazepam (ATIVAN) 0.5 MG tablet TAKE 1 TABLET BY MOUTH EVERY DAY AS NEEDED FOR ANXIETY   metoprolol tartrate (LOPRESSOR) 25 MG tablet Take 1 tablet (25 mg total) by mouth 2 (two) times daily.   pantoprazole (PROTONIX) 40 MG tablet TAKE 1 TABLET BY MOUTH EVERY DAY   PRADAXA 150 MG CAPS capsule TAKE 1 CAPSULE BY MOUTH TWICE A DAY   sitaGLIPtin (JANUVIA) 100 MG tablet Take 1 tablet (100 mg total) by mouth daily. (Patient not taking: Reported on 05/15/2021)   sucralfate (CARAFATE) 1 g tablet TAKE 1 TABLET BY MOUTH TWICE A DAY (Patient not taking: No sig reported)   No facility-administered encounter medications on file as of 05/16/2021.   Reviewed chart prior to disease state call. Spoke with patient regarding BP  Recent Office Vitals: BP Readings from Last 3 Encounters:  05/15/21 (!) 148/70  05/08/21 (!) 147/59  05/05/21 130/74   Pulse Readings from Last 3 Encounters:  05/15/21 66  05/08/21 (!) 57  05/05/21 80    Wt  Readings from Last 3 Encounters:  05/15/21 176 lb 12.8 oz (80.2 kg)  05/08/21 180 lb (81.6 kg)  05/05/21 180 lb (81.6 kg)     Kidney Function Lab Results  Component Value Date/Time   CREATININE 1.26 (H) 05/05/2021 10:40 AM   CREATININE 1.16 03/07/2021 01:00 PM   GFR 78.21 08/21/2013 12:19 PM   GFRNONAA 53 (L) 12/17/2020 08:27 AM   GFRAA 61 12/17/2020 08:27 AM    BMP Latest Ref Rng & Units 05/05/2021 03/07/2021 12/17/2020  Glucose 65 - 99 mg/dL 153(H) 126(H) 187(H)  BUN 7 - 25 mg/dL 18 15 14   Creatinine 0.70 - 1.22 mg/dL 1.26(H) 1.16 1.26(H)  BUN/Creat Ratio 6 - 22 (calc) 14 NOT APPLICABLE 11  Sodium 361 - 146 mmol/L 143 141 139  Potassium 3.5 - 5.3 mmol/L 3.9 4.0 3.6  Chloride 98 - 110 mmol/L 105 106 101  CO2 20 - 32 mmol/L 27 27 25   Calcium 8.6 - 10.3 mg/dL 9.5 9.7 9.7    Current antihypertensive regimen:  Valsartan 320 mg 0.5 tablets by mouth daily. Metoprolol 25 mg 0.5 tablets by mouth 2 (two) times daily.  How often are you checking your Blood Pressure? Patients friend stated daily  Current home BP readings: Patients friend stated his blood pressure ranges around 130/70-80.  What recent interventions/DTPs have been made by any provider to improve Blood Pressure control since last CPP Visit: None.  Any recent hospitalizations or ED visits since last visit with CPP? Patient stated no.   What  diet changes have been made to improve Blood Pressure Control?  Patients friend stated he doesn't have a good appetite and he is only eating one big meal a day. She stated she is trying to push more fluids daily.  What exercise is being done to improve your Blood Pressure Control?  Patients friend stated his activity Is not good. When he stands up he is unstable and needs assistance walking.  Adherence Review: Is the patient currently on ACE/ARB medication? N/A.  Does the patient have >5 day gap between last estimated fill dates? N/A.  Care Gaps:Patient is due for his eye and  foot exam.   Star Rating Drugs:Atorvastatin 40 mg 03/06/21 90 DS, Januvia 100 mg, Valsartan 04/01/21 90 DS.   Patients friend Joelene Millin is trying to get him a CNA nurse to help with her daily.   Follow-Up:Pharmacist Review  Charlann Lange, [place] Pharmacist Assistant [phone]

## 2021-05-16 NOTE — Telephone Encounter (Signed)
Prescription sent to pharmacy. .   Call placed to patient and patient made aware.  

## 2021-05-16 NOTE — Telephone Encounter (Signed)
Januvia is not covered by insurance.   Preferred alternatives are: Onglyza Tradjenta  Please advise.

## 2021-05-29 ENCOUNTER — Telehealth: Payer: Self-pay | Admitting: *Deleted

## 2021-05-29 NOTE — Telephone Encounter (Signed)
Received call from patient significant other, Kim.   Reports that patient continues to be unsteady and is supposed to be referred to PT but has not heard anything about referral. Please follow up and contact patient [name] at [phone]- telephone.   Also inquired if there is any other treatment options PCP could recommend. Please advise.

## 2021-05-30 ENCOUNTER — Other Ambulatory Visit: Payer: Self-pay | Admitting: Family Medicine

## 2021-05-30 DIAGNOSIS — R2681 Unsteadiness on feet: Secondary | ICD-10-CM

## 2021-05-30 NOTE — Telephone Encounter (Signed)
Call placed to patient and patient SO made aware.

## 2021-06-06 ENCOUNTER — Other Ambulatory Visit: Payer: Self-pay | Admitting: Family Medicine

## 2021-06-06 DIAGNOSIS — F32A Depression, unspecified: Secondary | ICD-10-CM

## 2021-06-06 DIAGNOSIS — F419 Anxiety disorder, unspecified: Secondary | ICD-10-CM

## 2021-06-26 ENCOUNTER — Other Ambulatory Visit: Payer: Self-pay

## 2021-06-26 ENCOUNTER — Ambulatory Visit (INDEPENDENT_AMBULATORY_CARE_PROVIDER_SITE_OTHER): Payer: Medicare Other | Admitting: Dermatology

## 2021-06-26 DIAGNOSIS — C44629 Squamous cell carcinoma of skin of left upper limb, including shoulder: Secondary | ICD-10-CM | POA: Diagnosis not present

## 2021-06-26 DIAGNOSIS — C4492 Squamous cell carcinoma of skin, unspecified: Secondary | ICD-10-CM

## 2021-06-26 NOTE — Patient Instructions (Signed)

## 2021-07-03 ENCOUNTER — Emergency Department
Admission: EM | Admit: 2021-07-03 | Discharge: 2021-07-03 | Disposition: A | Discharge status: Home / Self Care | Payer: Medicare Other | Attending: Emergency Medicine | Admitting: Emergency Medicine

## 2021-07-03 ENCOUNTER — Emergency Department: Payer: Medicare Other

## 2021-07-03 ENCOUNTER — Encounter: Payer: Self-pay | Admitting: Emergency Medicine

## 2021-07-03 ENCOUNTER — Other Ambulatory Visit: Payer: Self-pay

## 2021-07-03 DIAGNOSIS — E119 Type 2 diabetes mellitus without complications: Secondary | ICD-10-CM | POA: Insufficient documentation

## 2021-07-03 DIAGNOSIS — Z8546 Personal history of malignant neoplasm of prostate: Secondary | ICD-10-CM | POA: Diagnosis not present

## 2021-07-03 DIAGNOSIS — Z79899 Other long term (current) drug therapy: Secondary | ICD-10-CM | POA: Insufficient documentation

## 2021-07-03 DIAGNOSIS — I10 Essential (primary) hypertension: Secondary | ICD-10-CM | POA: Insufficient documentation

## 2021-07-03 DIAGNOSIS — S3210XA Unspecified fracture of sacrum, initial encounter for closed fracture: Secondary | ICD-10-CM | POA: Insufficient documentation

## 2021-07-03 DIAGNOSIS — Z7984 Long term (current) use of oral hypoglycemic drugs: Secondary | ICD-10-CM | POA: Diagnosis not present

## 2021-07-03 DIAGNOSIS — Z7901 Long term (current) use of anticoagulants: Secondary | ICD-10-CM | POA: Insufficient documentation

## 2021-07-03 DIAGNOSIS — I4891 Unspecified atrial fibrillation: Secondary | ICD-10-CM | POA: Diagnosis not present

## 2021-07-03 DIAGNOSIS — R42 Dizziness and giddiness: Secondary | ICD-10-CM | POA: Diagnosis not present

## 2021-07-03 DIAGNOSIS — S3993XA Unspecified injury of pelvis, initial encounter: Secondary | ICD-10-CM | POA: Diagnosis present

## 2021-07-03 DIAGNOSIS — W19XXXA Unspecified fall, initial encounter: Secondary | ICD-10-CM | POA: Diagnosis not present

## 2021-07-03 DIAGNOSIS — Z85828 Personal history of other malignant neoplasm of skin: Secondary | ICD-10-CM | POA: Insufficient documentation

## 2021-07-03 MED ORDER — HYDROCODONE-ACETAMINOPHEN 5-325 MG PO TABS: 2.0000 | ORAL_TABLET | Freq: Four times a day (QID) | ORAL | 0 refills | Status: DC | PRN

## 2021-07-03 MED ORDER — OXYCODONE HCL 5 MG PO TABS
5.0000 mg | ORAL_TABLET | ORAL | Status: AC
  Administered 2021-07-03: 5 mg via ORAL
  Filled 2021-07-03: qty 1

## 2021-07-03 MED ORDER — ACETAMINOPHEN 500 MG PO TABS
1000.0000 mg | ORAL_TABLET | Freq: Once | ORAL | Status: AC
  Administered 2021-07-03: 1000 mg via ORAL
  Filled 2021-07-03: qty 2

## 2021-07-03 NOTE — ED Notes (Signed)
Ambulated to BR independently.  Gait steady.  Tolerated well. 

## 2021-07-03 NOTE — ED Provider Notes (Signed)
[facility] Emergency Department Provider Note  ____________________________________________   Event Date/Time   First MD Initiated Contact with Patient 07/03/21 [phone]     (approximate)  I have reviewed the triage vital signs and the nursing notes.   HISTORY  Chief Complaint Fall   HPI Antonio Clark is a 85 y.o. male with a past medical history of arthritis, A. fib on Pradaxa, DM, HTN, HDL, OSA with CPAP at night, chronic back pain, arthritis, and some chronic intermittent vertigo pending further evaluation with ENT apparently has been ongoing for the past year who presents for assessment after a fall that occurred last night.  Patient stated that she had an episode of dizziness similar to prior vertigo episodes he has had in the past going back about a year.  He states this occurred while he was going to bed.  He uses a walker normally and states that he was using this but fell onto his right hip.  He does not think he hit his head or had any LOC.  States the dizziness only lasted a few minutes and he has no dizziness since arrival to emergency room.  States he has had fairly severe pain on the right hip.  He has no pain in his back, neck, head, chest, abdomen, left lower extremity or bilateral upper extremities.  States he has had about 3 falls before this month.  States his PCP is aware of this and following.  He denies any other acute concerns at this time.         Past Medical History:  Diagnosis Date   Anal fissure    Arthritis    Atrial fibrillation ([place])    Back pain    Colon polyps    Diabetes mellitus type 2 with complications ([place])    Dysrhythmia    a-fib   GERD (gastroesophageal reflux disease)    History of echocardiogram 07/ 07/ 2011   History of lithotripsy 1989   Hyperlipidemia    Hypertension    Kidney stones    Melanoma ([place]) 05/01/2019   right chest wall (12/20)   Prostate CA ([place]) 2010   Sleep apnea    uses CPAP nightly    Squamous cell carcinoma of skin 05/23/1992   bowens-left parietal scalp (CX35FU)   Squamous cell carcinoma of skin 03/14/2008   in situ-left upper outer forehead-medial (CX35FU)   Squamous cell carcinoma of skin 03/14/2008   in situ-crown of scalp (Cx35FU)   Squamous cell carcinoma of skin 04/15/2011   in situ-right dorsal forearm (txpbx)   Squamous cell carcinoma of skin 10/16/2011   in situ-left sideburn   Squamous cell carcinoma of skin 03/11/2015   ka-left sideburn (CX35FU)   Squamous cell carcinoma of skin 03/11/2015   ka-left forearm (CX35FU)   Squamous cell carcinoma of skin 06/22/2018   in situ-left forearm, sup (txpbx)   Squamous cell carcinoma of skin 05/01/2019   in situ-mid anterior scalp    Squamous cell carcinoma of skin 05/01/2019   in situ-right upper arm    Patient Active Problem List   Diagnosis Date Noted   Orthostatic hypotension 05/02/2021   Melanoma ([place])    Atrial fibrillation ([place])    Malignant neoplasm of prostate ([place]) 07/15/2015   Recurrent nephrolithiasis 07/15/2015   Personal history of prostate cancer 04/25/2015   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/09/2013   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   Coronary artery disease 11/02/2011   Urethral stricture 06/22/2011   Hyperlipidemia  Borderline diabetic    Diabetes mellitus with coincident hypertension [facility])     Past Surgical History:  Procedure Laterality Date   ATRIAL FIBRILLATION ABLATION N/A 04/17/2020   Procedure: ATRIAL FIBRILLATION ABLATION;  Surgeon: Vickie Epley, MD;  Location: [facility] CV LAB;  Service: Cardiovascular;  Laterality: N/A;   AXILLARY SENTINEL NODE BIOPSY Right 08/23/2019   Procedure: SENTINEL LYMPH NODE BIOSPY RIGHT AXILLA;  Surgeon: Stark Klein, MD;  Location: [place];  Service: General;  Laterality: Right;   CARDIOVERSION N/A 05/10/2020   Procedure: CARDIOVERSION;  Surgeon: Freada Bergeron, MD;  Location[facility] ENDOSCOPY;  Service:  Cardiovascular;  Laterality: N/A;   CARDIOVERSION N/A 05/08/2021   Procedure: CARDIOVERSION;  Surgeon: Donato Heinz, MD;  Location: [facility] ENDOSCOPY;  Service: Cardiovascular;  Laterality: N/A;   COLONOSCOPY  2002   FASCIECTOMY Left 03/28/2019   Procedure: SEGMENTAL FASCIECTOMY LEFT RING FINGER;  Surgeon: Daryll Brod, MD;  Location: [place];  Service: Orthopedics;  Laterality: Left;  ANESTHESIA  AXILLARY BLOCK   INSERTION PROSTATE RADIATION SEED  8 4 2010   per Dr Rosana Hoes   MELANOMA EXCISION Right 06/22/2019   Procedure: WIDE LOCAL EXCISION RIGHT CHEST WALL MELANOMA, ADVANCEMENT FLAP CLOSURE FOR DEFECT 3X6 CM;  Surgeon: Stark Klein, MD;  Location: [place];  Service: General;  Laterality: Right;   POLYPECTOMY  2002   THROAT SURGERY  1980   benign cyst   UPPER GASTROINTESTINAL ENDOSCOPY     URETHRAL STRICTURE DILATATION     also penile implant    Prior to Admission medications   Medication Sig Start Date End Date Taking? Authorizing Provider  HYDROcodone-acetaminophen (NORCO) 5-325 MG tablet Take 2 tablets by mouth every 6 (six) hours as needed for up to 5 days for moderate pain. 07/03/21 07/08/21 Yes Lucrezia Starch, MD  atorvastatin (LIPITOR) 40 MG tablet Take 40 mg by mouth daily. 03/06/21   [provider]  cetirizine (ZYRTEC) 10 MG tablet Take 10 mg by mouth daily as needed for allergies. 03/07/21   [provider]  escitalopram (LEXAPRO) 10 MG tablet TAKE 1 TABLET BY MOUTH EVERY DAY 06/06/21   Susy Frizzle, MD  fluticasone [facility]) 50 MCG/ACT nasal spray Place 1 spray into both nostrils daily as needed for allergies or rhinitis.    [provider]  linagliptin (TRADJENTA) 5 MG TABS tablet Take 1 tablet (5 mg total) by mouth daily. 05/16/21   Susy Frizzle, MD  LORazepam (ATIVAN) 0.5 MG tablet TAKE 1 TABLET BY MOUTH EVERY DAY AS NEEDED FOR ANXIETY 03/11/21   Susy Frizzle, MD  metoprolol tartrate (LOPRESSOR)  25 MG tablet Take 1 tablet (25 mg total) by mouth 2 (two) times daily. 05/16/21   Jerline Pain, MD  pantoprazole (PROTONIX) 40 MG tablet TAKE 1 TABLET BY MOUTH EVERY DAY 02/11/21   Mauri Pole, MD  PRADAXA 150 MG CAPS capsule TAKE 1 CAPSULE BY MOUTH TWICE A DAY 01/07/21   Jerline Pain, MD  sucralfate (CARAFATE) 1 g tablet TAKE 1 TABLET BY MOUTH TWICE A DAY Patient not taking: No sig reported 04/28/21   Mauri Pole, MD    Allergies Patient has no known allergies.  Family History  Problem Relation Age of Onset   Coronary artery disease Brother        3 brothers had CABG   Arrhythmia Brother    Diabetes Brother    Hearing loss Brother    Hypertension Brother  Heart disease Brother    Heart failure Brother    Hypertension Brother    Heart disease Father    Hypertension Father    Sudden death Father    Stroke Mother        cerebral hemorrage   Colon cancer Mother 77       small intestine   Depression Mother    Stroke Sister    Suicidality Sister    Diabetes Brother    Hypertension Brother    Hyperlipidemia Sister    Esophageal cancer Neg Hx    Pancreatic cancer Neg Hx    Prostate cancer Neg Hx    Rectal cancer Neg Hx    Stomach cancer Neg Hx     Social History Social History   Tobacco Use   Smoking status: Never   Smokeless tobacco: Never  Vaping Use   Vaping Use: Never used  Substance Use Topics   Alcohol use: Not Currently    Comment: social   Drug use: No    Review of Systems  Review of Systems  Constitutional:  Negative for chills and fever.  HENT:  Negative for sore throat.   Eyes:  Negative for pain.  Respiratory:  Negative for cough and stridor.   Cardiovascular:  Negative for chest pain.  Gastrointestinal:  Negative for vomiting.  Musculoskeletal:  Positive for falls and joint pain (R hip).  Skin:  Negative for rash.  Neurological:  Positive for dizziness (chronic episodic over past year). Negative for seizures, loss of  consciousness and headaches.  Psychiatric/Behavioral:  Negative for suicidal ideas.   All other systems reviewed and are negative.    ____________________________________________   PHYSICAL EXAM:  VITAL SIGNS: ED Triage Vitals  Enc Vitals Group     BP 07/03/21 0845 (!) 144/103     Pulse Rate 07/03/21 0845 66     Resp 07/03/21 0845 20     Temp 07/03/21 0845 98.5 F (36.9 C)     Temp Source 07/03/21 0845 Oral     SpO2 07/03/21 0845 96 %     Weight 07/03/21 0805 176 lb 12.9 oz (80.2 kg)     Height 07/03/21 0805 5\' 9"  (1.753 m)     Head Circumference --      Peak Flow --      Pain Score 07/03/21 0803 7     Pain Loc --      Pain Edu? --      Excl. in [place]? --    Vitals:   07/03/21 0845  BP: (!) 144/103  Pulse: 66  Resp: 20  Temp: 98.5 F (36.9 C)  SpO2: 96%   Physical Exam Vitals and nursing note reviewed.  Constitutional:      General: He is not in acute distress.    Appearance: He is well-developed.  HENT:     Head: Normocephalic and atraumatic.     Right Ear: External ear normal.     Left Ear: External ear normal.     Nose: Nose normal.  Eyes:     Conjunctiva/sclera: Conjunctivae normal.  Cardiovascular:     Rate and Rhythm: Normal rate and regular rhythm.     Heart sounds: No murmur heard. Pulmonary:     Effort: Pulmonary effort is normal. No respiratory distress.     Breath sounds: Normal breath sounds.  Abdominal:     Palpations: Abdomen is soft.     Tenderness: There is no abdominal tenderness.  Musculoskeletal:  General: No swelling.     Cervical back: Neck supple.  Skin:    General: Skin is warm and dry.     Capillary Refill: Capillary refill takes less than 2 seconds.  Neurological:     Mental Status: He is alert.  Psychiatric:        Mood and Affect: Mood normal.    No finger metria.  No pronator drift.  Cranial nerves II through XII are grossly intact.  There is no nystagmus.  There is no tenderness step-offs or deformities over the  C/T/L-spine.  Patient has full and symmetric strength of his bilateral upper extremities and left lower extremity.  He has decreased range of motion and some pain with passive range of motion of the right hip but no pain or decree strength at the right knee or ankle.  2+ radial and PT pulses.  Sensation is intact light touch throughout all extremities.  There is no overlying skin changes of the right hip other evidence of trauma. ____________________________________________   LABS (all labs ordered are listed, but only abnormal results are displayed)  Labs Reviewed - No data to display ____________________________________________  EKG  ____________________________________________  RADIOLOGY  ED MD interpretation: Plain film of the right hip shows no acute fracture or dislocation.  CT head has no evidence of hemorrhage, ischemia mass-effect or other acute process.  There are some moderate cerebral atrophy and chronic appearing microvascular ischemic changes.  CT of the right hip shows an acute appearing S5 sacral fracture near the sacrococcygeal junction.  No hip fracture or other acute process.  There is some osteoarthritis noted.  Official radiology report(s): CT HEAD WO CONTRAST (5MM)  Result Date: 07/03/2021 CLINICAL DATA:  [place] last night.  Denies hitting his head. EXAM: CT HEAD WITHOUT CONTRAST TECHNIQUE: Contiguous axial images were obtained from the base of the skull through the vertex without intravenous contrast. COMPARISON:  None. FINDINGS: Brain: No evidence of acute infarction, hemorrhage, hydrocephalus, extra-axial collection or mass lesion/mass effect. Moderate generalized cerebral atrophy. Scattered mild periventricular and subcortical Skeels matter hypodensities are nonspecific, but favored to reflect chronic microvascular ischemic changes. Vascular: Atherosclerotic vascular calcification of the carotid siphons. No hyperdense vessel. Skull: Normal. Negative for fracture or  focal lesion. Sinuses/Orbits: No acute finding. Other: None. IMPRESSION: 1. No acute intracranial abnormality. 2. Moderate cerebral atrophy and mild chronic microvascular ischemic changes. Electronically Signed   By: Titus Dubin M.D.   On: 07/03/2021 10:00   CT Hip Right Wo Contrast  Result Date: 07/03/2021 CLINICAL DATA:  Right hip pain after fall last night. EXAM: CT OF THE RIGHT HIP WITHOUT CONTRAST TECHNIQUE: Multidetector CT imaging of the right hip was performed according to the standard protocol. Multiplanar CT image reconstructions were also generated. COMPARISON:  Right hip x-rays from same day. FINDINGS: Bones/Joint/Cartilage No hip fracture. Acute appearing fracture through the S5 vertebral body (series 7, image 90). Mild right hip joint space narrowing with small marginal osteophytes. No joint effusion. Ligaments Ligaments are suboptimally evaluated by CT. Muscles and Tendons Grossly intact. Soft tissue No fluid collection or hematoma. No soft tissue mass. Brachytherapy seeds in the prostate gland. Penile implant. IMPRESSION: 1. Acute appearing S5 sacral fracture near the sacrococcygeal junction. Correlate with point tenderness. 2. No hip fracture. 3. Mild right hip osteoarthritis. Electronically Signed   By: Titus Dubin M.D.   On: 07/03/2021 10:05   DG Hip Unilat  With Pelvis 2-3 Views Right  Result Date: 07/03/2021 CLINICAL DATA:  Status post fall with  right hip pain. EXAM: DG HIP (WITH OR WITHOUT PELVIS) 2-3V RIGHT COMPARISON:  None. FINDINGS: There is no evidence of hip fracture or dislocation. Narrow bilateral hip joint spaces are identified. Breast therapy see are identified in the prostate. IMPRESSION: No acute fracture or dislocation. Electronically Signed   By: Abelardo Diesel M.D.   On: 07/03/2021 08:34    ____________________________________________   PROCEDURES  Procedure(s) performed (including Critical  Care):  Procedures   ____________________________________________   INITIAL IMPRESSION / ASSESSMENT AND PLAN / ED COURSE      Patient presents with above to history exam for assessment of fall described above.  Seems this is related to some chronic vertigo he is currently getting worked up on outpatient basis that is ongoing over a year.  He is denying any pain other than the right hip.  Other than some pain and weakness here primarily on the posterior aspect he has no other acute findings on exam or significant trauma.  Given his age and that he reports multiple falls over the last month and is on Pradaxa I did obtain a CT head as well to rule out possible SAH or other acute cranial process.  Plain film of the right hip shows no acute fracture or dislocation.  CT head has no evidence of hemorrhage, ischemia mass-effect or other acute process.  There are some moderate cerebral atrophy and chronic appearing microvascular ischemic changes.  CT of the right hip shows an acute appearing S5 sacral fracture near the sacrococcygeal junction.  No hip fracture or other acute process.  There is some osteoarthritis noted.  I discussed patient's S5 sacral fracture with on-call orthopedist Dr. Roland Rack who recommended no emergent surgical intervention.  On reassessment patient's pain is much better and is able to ambulate using his cane and states he is nearly back to baseline.  Given low suspicion for other significant occult or visceral injury with patient able to ambulate and pain well controlled I think he is stable for discharge with outpatient Ortho follow-up.  Also advised close outpatient PCP follow-up to discuss risk versus benefits of ongoing Pradaxa use with recurrent falls.  Do not believe he requires further emergent work-up of his vertigo which sounds very much like BPPV versus other peripheral vertigo.  He has no symptoms at this time he has been ongoing for a year.  At this time I have a low  suspicion for CVA or significant metabolic derangement or arrhythmia that could have precipitated this.  Rx written for analgesia.  Discharged stable condition.  Strict return precautions advised and discussed.      ____________________________________________   FINAL CLINICAL IMPRESSION(S) / ED DIAGNOSES  Final diagnoses:  Fall, initial encounter  Closed fracture of sacrum, unspecified portion of sacrum, initial encounter ([place])    Medications  acetaminophen (TYLENOL) tablet 1,000 mg (1,000 mg Oral Given 07/03/21 0947)  oxyCODONE (Oxy IR/ROXICODONE) immediate release tablet 5 mg (5 mg Oral Given 07/03/21 1037)     ED Discharge Orders          Ordered    HYDROcodone-acetaminophen (NORCO) 5-325 MG tablet  Every 6 hours PRN        07/03/21 1106             Note:  This document was prepared using Dragon voice recognition software and may include unintentional dictation errors.    Lucrezia Starch, MD 07/03/21 [phone]

## 2021-07-03 NOTE — ED Triage Notes (Signed)
Pt comes into the ED via POV c/o fall last night while trying to turn off his TV and he lost his balance.  Pt states he fell on the right hip and he now has some discomfort.  Pt ambulatory to triage with steady gait.  Pt in NAD with even and unlabored respirations.  Pt denies hitting his head, denies any LOC.

## 2021-07-07 ENCOUNTER — Telehealth: Payer: Self-pay | Admitting: Family Medicine

## 2021-07-07 DIAGNOSIS — M1611 Unilateral primary osteoarthritis, right hip: Secondary | ICD-10-CM

## 2021-07-07 DIAGNOSIS — S3210XA Unspecified fracture of sacrum, initial encounter for closed fracture: Secondary | ICD-10-CM

## 2021-07-07 NOTE — Telephone Encounter (Signed)
Returned patient's call; left message to follow up on MRI. Having back spasms; seeking advise from provider. Patient asked if provider will call with results rather than having to schedule an in-office visit.   Also requesting refill of HYDROcodone-acetaminophen ([place]) 5-325 MG tablet [[id number]]   Pharmacy confirmed as   [id number] - [place], [address]  [place], [place] [address]  Phone:  [phone]  Fax:  [phone]  DEA #:  [id number]  Last dose taken at 2:00am. Patient goes down to the floor when spasms come.   Please advise at [phone].

## 2021-07-08 ENCOUNTER — Ambulatory Visit (INDEPENDENT_AMBULATORY_CARE_PROVIDER_SITE_OTHER): Payer: Medicare Other

## 2021-07-08 ENCOUNTER — Other Ambulatory Visit: Payer: Self-pay

## 2021-07-08 ENCOUNTER — Other Ambulatory Visit: Payer: Self-pay | Admitting: Family Medicine

## 2021-07-08 DIAGNOSIS — Z4802 Encounter for removal of sutures: Secondary | ICD-10-CM

## 2021-07-08 MED ORDER — HYDROCODONE-ACETAMINOPHEN 5-325 MG PO TABS: 2.0000 | ORAL_TABLET | Freq: Four times a day (QID) | ORAL | 0 refills | Status: AC | PRN

## 2021-07-08 NOTE — Progress Notes (Signed)
Path to patient no signs of infection at suture removal

## 2021-07-09 ENCOUNTER — Other Ambulatory Visit: Payer: Self-pay | Admitting: Cardiology

## 2021-07-09 NOTE — Telephone Encounter (Signed)
Pt last saw Roderic Palau, NP on 05/15/21, last labs 05/05/21 Creat 1.26, age 85, weight 80.2kg, CrCl 50.39, based on CrCl pt is on appropriate dosage of Pradaxa 150mg  BID for afib.  Will refill rx.

## 2021-07-09 NOTE — Telephone Encounter (Signed)
Spoke with pt and advised rx has been refilled. Pt would like referral to ortho for sacral fracture and hip pain. Referral placed. Nothing further needed.

## 2021-07-14 DIAGNOSIS — S3210XA Unspecified fracture of sacrum, initial encounter for closed fracture: Secondary | ICD-10-CM | POA: Insufficient documentation

## 2021-07-22 DIAGNOSIS — M545 Low back pain, unspecified: Secondary | ICD-10-CM | POA: Insufficient documentation

## 2021-07-22 DIAGNOSIS — M25551 Pain in right hip: Secondary | ICD-10-CM | POA: Insufficient documentation

## 2021-07-25 ENCOUNTER — Encounter: Payer: Self-pay | Admitting: Dermatology

## 2021-07-25 NOTE — Progress Notes (Signed)
Follow-Up Visit   Subjective  Antonio Clark is a 85 y.o. male who presents for the following: Procedure (Here to treat SCC's).  Multiple biopsy-proven nonmelanoma cancers, will begin with shoulder lesion Location:  Duration:  Quality:  Associated Signs/Symptoms: Modifying Factors:  Severity:  Timing: Context:   Objective  Well appearing patient in no apparent distress; mood and affect are within normal limits. Left Shoulder - Anterior Lesion identified by Dr.Deneen Slager and nurse in room.   X 5 3-0 vicryl only     A focused examination was performed including head, neck, waist. Relevant physical exam findings are noted in the Assessment and Plan.   Assessment & Plan    Squamous cell carcinoma of skin Left Shoulder - Anterior  Skin excision  Lesion length (cm):  1.5 Lesion width (cm):  1.5 Margin per side (cm):  0.1 Total excision diameter (cm):  1.7 Informed consent: discussed and consent obtained   Timeout: patient name, date of birth, surgical site, and procedure verified   Anesthesia: the lesion was anesthetized in a standard fashion   Anesthetic:  1% lidocaine w/ epinephrine 1-100,000 local infiltration Instrument used: #15 blade   Hemostasis achieved with: pressure and electrodesiccation   Outcome: patient tolerated procedure well with no complications   Post-procedure details: sterile dressing applied and wound care instructions given   Dressing type: bandage, petrolatum and pressure dressing    Destruction of lesion Complexity: simple   Destruction method: electrodesiccation and curettage   Informed consent: discussed and consent obtained   Timeout:  patient name, date of birth, surgical site, and procedure verified Anesthesia: the lesion was anesthetized in a standard fashion   Anesthetic:  1% lidocaine w/ epinephrine 1-100,000 local infiltration Curettage performed in three different directions: Yes   Electrodesiccation performed over the curetted area:  Yes   Curettage cycles:  3 Lesion length (cm):  1.5 Lesion width (cm):  1.5 Margin per side (cm):  0 Final wound size (cm):  1.5 Hemostasis achieved with:  aluminum chloride Outcome: patient tolerated procedure well with no complications   Post-procedure details: wound care instructions given    Skin repair Complexity:  Intermediate Final length (cm):  2.2 Informed consent: discussed and consent obtained   Timeout: patient name, date of birth, surgical site, and procedure verified   Procedure prep:  Patient was prepped and draped in usual sterile fashion Reason for type of repair: reduce tension to allow closure and reduce the risk of dehiscence, infection, and necrosis   Undermining: edges could be approximated without difficulty   Subcutaneous layers (deep stitches):  Suture size:  4-0 Suture type: Vicryl (polyglactin 910)   Fine/surface layer approximation (top stitches):  Suture size:  4-0 Suture type: Vicryl (polyglactin 910)   Hemostasis achieved with: suture Outcome: patient tolerated procedure well with no complications   Post-procedure details: sterile dressing applied   Dressing type: petrolatum   Additional details:  Patient understands that if the sutures do not absorb he can come in after the holiday for removal.  Specimen 1 - Surgical pathology Differential Diagnosis: scc  [id number] exc   Check Margins: yes posterior margin stained   Curettage showed this to be a fairly deep 1+ centimeter lesion.  Base and margins cauterized, recuretted, narrow margin excision with layered closure but exclusively Vicryl sutures because of upcoming holiday.      I, Lavonna Monarch, MD, have reviewed all documentation for this visit.  The documentation on 07/25/21 for the exam, diagnosis, procedures, and orders are  all accurate and complete.

## 2021-08-06 ENCOUNTER — Telehealth: Payer: Self-pay

## 2021-08-06 DIAGNOSIS — R2689 Other abnormalities of gait and mobility: Secondary | ICD-10-CM | POA: Insufficient documentation

## 2021-08-06 DIAGNOSIS — M545 Low back pain, unspecified: Secondary | ICD-10-CM

## 2021-08-06 DIAGNOSIS — R2681 Unsteadiness on feet: Secondary | ICD-10-CM

## 2021-08-06 DIAGNOSIS — M1611 Unilateral primary osteoarthritis, right hip: Secondary | ICD-10-CM

## 2021-08-06 NOTE — Telephone Encounter (Signed)
Pt's caregiver, Joelene Millin, called to report pt saw Dr. Redmond Baseman (ENT) today and he recommended pt may benefit from PT services. They would like referral placed.   PT referral placed, she is aware someone will contact them to schedule.

## 2021-08-14 DIAGNOSIS — M25521 Pain in right elbow: Secondary | ICD-10-CM | POA: Insufficient documentation

## 2021-08-15 ENCOUNTER — Telehealth: Payer: Self-pay | Admitting: *Deleted

## 2021-08-15 NOTE — Telephone Encounter (Signed)
Pt aide(kimber)--called stated pt have unsteady feet/balance and wondering if anything conflicting with the medications. Kimber (aide) mention pt have appt. Next Thursday with the orthopedic.Please advise

## 2021-08-15 NOTE — Telephone Encounter (Signed)
Notied pt aide regarding the medication Rx.Lorazepam that can affect balance by Dr. Dennard Schaumann.

## 2021-08-18 DIAGNOSIS — M7020 Olecranon bursitis, unspecified elbow: Secondary | ICD-10-CM | POA: Insufficient documentation

## 2021-08-19 ENCOUNTER — Ambulatory Visit: Payer: Medicare Other | Attending: Family Medicine

## 2021-08-19 ENCOUNTER — Other Ambulatory Visit: Payer: Self-pay

## 2021-08-19 DIAGNOSIS — M1611 Unilateral primary osteoarthritis, right hip: Secondary | ICD-10-CM | POA: Diagnosis not present

## 2021-08-19 DIAGNOSIS — R2689 Other abnormalities of gait and mobility: Secondary | ICD-10-CM | POA: Diagnosis present

## 2021-08-19 DIAGNOSIS — M6281 Muscle weakness (generalized): Secondary | ICD-10-CM

## 2021-08-19 DIAGNOSIS — R2681 Unsteadiness on feet: Secondary | ICD-10-CM

## 2021-08-19 DIAGNOSIS — M545 Low back pain, unspecified: Secondary | ICD-10-CM | POA: Diagnosis not present

## 2021-08-19 NOTE — Therapy (Signed)
OUTPATIENT PHYSICAL THERAPY NEURO EVALUATION   Patient Name: Antonio Clark MRN: [id number] DOB:1936/01/21, 86 y.o., male Today's Date: 08/19/2021  PCP: Susy Frizzle, MD REFERRING PROVIDER: Susy Frizzle, MD   PT End of Session - 08/19/21 1403     Visit Number 1    Number of Visits 8    Date for PT Re-Evaluation 09/16/21    Authorization Type UHC MCR    Progress Note Due on Visit 8    PT Start Time 1315    PT Stop Time 1400    PT Time Calculation (min) 45 min    Activity Tolerance Patient tolerated treatment well    Behavior During Therapy [facility] for tasks assessed/performed             Past Medical History:  Diagnosis Date   Anal fissure    Arthritis    Atrial fibrillation ([place])    Back pain    Colon polyps    Diabetes mellitus type 2 with complications ([place])    Dysrhythmia    a-fib   GERD (gastroesophageal reflux disease)    History of echocardiogram 07/ 07/ 2011   History of lithotripsy 1989   Hyperlipidemia    Hypertension    Kidney stones    Melanoma ([place]) 05/01/2019   right chest wall (12/20)   Prostate CA ([place]) 2010   Sleep apnea    uses CPAP nightly   Squamous cell carcinoma of skin 05/23/1992   bowens-left parietal scalp (CX35FU)   Squamous cell carcinoma of skin 03/14/2008   in situ-left upper outer forehead-medial (CX35FU)   Squamous cell carcinoma of skin 03/14/2008   in situ-crown of scalp (Cx35FU)   Squamous cell carcinoma of skin 04/15/2011   in situ-right dorsal forearm (txpbx)   Squamous cell carcinoma of skin 10/16/2011   in situ-left sideburn   Squamous cell carcinoma of skin 03/11/2015   ka-left sideburn (CX35FU)   Squamous cell carcinoma of skin 03/11/2015   ka-left forearm (CX35FU)   Squamous cell carcinoma of skin 06/22/2018   in situ-left forearm, sup (txpbx)   Squamous cell carcinoma of skin 05/01/2019   in situ-mid anterior scalp    Squamous cell carcinoma of skin 05/01/2019   in situ-right upper arm   Past Surgical  History:  Procedure Laterality Date   ATRIAL FIBRILLATION ABLATION N/A 04/17/2020   Procedure: [place];  Surgeon: Vickie Epley, MD;  Location: [facility];  Service: Cardiovascular;  Laterality: N/A;   AXILLARY SENTINEL NODE BIOPSY Right 08/23/2019   Procedure: SENTINEL LYMPH NODE BIOSPY RIGHT AXILLA;  Surgeon: Stark Klein, MD;  Location: [place];  Service: General;  Laterality: Right;   CARDIOVERSION N/A 05/10/2020   Procedure: CARDIOVERSION;  Surgeon: Freada Bergeron, MD;  Location: [facility] ENDOSCOPY;  Service: Cardiovascular;  Laterality: N/A;   CARDIOVERSION N/A 05/08/2021   Procedure: CARDIOVERSION;  Surgeon: Donato Heinz, MD;  Location: [facility] ENDOSCOPY;  Service: Cardiovascular;  Laterality: N/A;   COLONOSCOPY  2002   FASCIECTOMY Left 03/28/2019   Procedure: SEGMENTAL FASCIECTOMY LEFT RING FINGER;  Surgeon: Daryll Brod, MD;  Location: [facility] [place];  Service: Orthopedics;  Laterality: Left;  ANESTHESIA  AXILLARY BLOCK   INSERTION PROSTATE RADIATION SEED  8 4 2010   per Dr Rosana Hoes   MELANOMA EXCISION Right 06/22/2019   Procedure: WIDE LOCAL EXCISION RIGHT CHEST WALL MELANOMA, ADVANCEMENT FLAP CLOSURE FOR DEFECT 3X6 CM;  Surgeon: Stark Klein, MD;  Location: [place];  Service: General;  Laterality: Right;   POLYPECTOMY  2002   THROAT SURGERY  1980   benign cyst   UPPER GASTROINTESTINAL ENDOSCOPY     URETHRAL STRICTURE DILATATION     also penile implant   Patient Active Problem List   Diagnosis Date Noted   Orthostatic hypotension 05/02/2021   Melanoma ([place])    Atrial fibrillation ([place])    Malignant neoplasm of prostate ([place]) 07/15/2015   Recurrent nephrolithiasis 07/15/2015   Personal history of prostate cancer 04/25/2015   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/09/2013   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   Coronary artery disease 11/02/2011   Urethral stricture 06/22/2011   Hyperlipidemia     Borderline diabetic    Diabetes mellitus with coincident hypertension ([place])     ONSET DATE: 08/06/2021   REFERRING DIAG: Diagnosis M16.11 (ICD-10-CM) - Primary osteoarthritis of right hip R26.81 (ICD-10-CM) - Unsteadiness on feet R26.89 (ICD-10-CM) - Balance problem M54.50 (ICD-10-CM) - Low back pain at multiple sites   THERAPY DIAG:  Unsteadiness on feet  Muscle weakness (generalized)  Other abnormalities of gait and mobility  SUBJECTIVE:   SUBJECTIVE : Relates a history of falls ongoing over the pat 6 months resulting in 5-6 falls                                                                                                                                                                                                         PERTINENT HISTORY: Ataxia Proceed with a chest x-ray to rule out an underlying pneumonia.  Meanwhile treat bronchospasms with prednisone.  I do not feel that he needs albuterol right now especially given his history of atrial fibrillation.  If the prednisone clears the wheezing and the chest x-ray is clear, no further work-up is necessary however if the chest x-ray shows any underlying interstitial lung disease, the patient may benefit from pulmonology consultation for pulmonary function test.  I believe the ataxia is likely due to chronic microvascular disease affecting the cerebellum.  This will have to be managed by controlling his risk factors including hypertension hyperlipidemia and diabetes.  However I would recommend physical therapy and I will consult [place] neurology for this.  PAIN:  Are you having pain? No VAS scale: 0/10  PRECAUTIONS: Fall  WEIGHT BEARING RESTRICTIONS No  FALLS: Has patient fallen in last 6 months? Yes, Number of falls: 6  LIVING ENVIRONMENT: Lives with: lives alone Lives in: House/apartment Stairs: Yes; Internal: 2 steps; on right going up and External: 3 steps; on right going up Has following equipment at home:  Quad  cane small base  PLOF: Independent  PATIENT GOALS: To improve my balance and prevent further falls  OBJECTIVE:   DIAGNOSTIC FINDINGS: Ataxia Proceed with a chest x-ray to rule out an underlying pneumonia.  Meanwhile treat bronchospasms with prednisone.  I do not feel that he needs albuterol right now especially given his history of atrial fibrillation.  If the prednisone clears the wheezing and the chest x-ray is clear, no further work-up is necessary however if the chest x-ray shows any underlying interstitial lung disease, the patient may benefit from pulmonology consultation for pulmonary function test.  I believe the ataxia is likely due to chronic microvascular disease affecting the cerebellum.  This will have to be managed by controlling his risk factors including hypertension hyperlipidemia and diabetes.  However I would recommend physical therapy and I will consult [place] neurology for this.  COGNITION: Overall cognitive status: Within functional limits for tasks assessed  SENSATION: Light touch: Appears intact   POSTURE: rounded shoulders, forward head, decreased lumbar lordosis, and posterior pelvic tilt  AROM/PROM:(WFL throughout)  A/PROM Right 08/19/2021 Left 08/19/2021  Hip flexion    Hip abduction    Hip adduction    Hip internal rotation    Hip external rotation    Knee flexion    Knee extension    Ankle dorsiflexion    Ankle plantarflexion    Ankle inversion    Ankle eversion     (Blank rows = not tested) MMT:(WFL throughout)  MMT Right 08/19/2021 Left 08/19/2021  Hip flexion    Hip abduction    Hip adduction    Hip internal rotation    Hip external rotation    Knee flexion    Knee extension    Ankle dorsiflexion    Ankle plantarflexion    Ankle inversion    Ankle eversion    (Blank rows = not tested)   TRANSFERS: Assistive device utilized: Lobbyist  Sit to stand: Modified independence Stand to sit: Modified independence Chair to  chair: Modified independence     GAIT: Gait pattern: step through pattern, decreased stride length, and lateral hip instability Distance walked: 231ft Assistive device utilized: Quad cane small base Level of assistance: Modified independence Comments: mild lateral drift noted during ambulation  FUNCTIONAL TESTs:  5xSTS time 21s mCTSIB able to hold all poritions x30s BERG BALANCE  Sitting to Standing: Numbers; 0-4: 4  4. Stands without using hands and stabilize independently  3. Stands independently using hands  2. Stands using hands after multiple trials  1. Min A to stand  0. Mod-Max A to stand Standing unsupported: Numbers; 0-4: 4  4. Stands safely for 2 minutes  3. Stands 2 minutes with supervision  2. Stands 30 seconds unsupported  1. Needs several tries to stand unsupported for 30 seconds  0. Unable to stand unsupported for 30 seconds Sitting unsupported: Numbers; 0-4: 4  4. Sits for 2 minutes independently  3. Sits for 2 minutes with supervision  2. Able to sit 30 seconds  1. Able to sit 10 seconds  0. Unable to sit for 10 seconds Standing to Sitting: Numbers; 0-4: 4 4. Sits safely with minimal use of hands 3. Controls descent with hands 2. Uses back of legs against chair to control descent 1. Sits independently, but uncontrolled descent 0. Needs assistance Transfers: Numbers; 0-4: 3  4. Transfers safely with minor use of hands  3. Transfers safely definite use of hands  2. Transfers with verbal cueing/supervision  1. Needs 1 person assist  0. Needs 2 person assist  Standing with eyes closed: Numbers; 0-4: 4  4. Stands safely for 10 seconds  3. Stands 10 seconds with supervision   2. Able to stand for 3 seconds  1. Unable to keep eyes closed for 3 seconds, but is safe  0. Needs assist to keep from falling Standing with feet together: Numbers; 0-4: 4 4. Stands for 1 minute safely 3. Stands for 1 minute with supervision 2. Unable to hold for 30 seconds  1.  Needs help to attain position but can hold for 15 seconds  0. Needs help to attain position and unable to hold for 15 seconds Reaching forward with outstretched arm: Numbers; 0-4: 4  4. Reaches forward 10 inches  3. Reaches forward 5 inches  2. Reaches forward 2 inches  1. Reaches forward with supervision  0. Loses balance/requires assistace Retrieving object from the floor: Numbers; 0-4: 4 4. Able to pick up easily and safely 3. Able to pick up with supervision 2. Unable to pick up, but reaches within 1-2 inches independently 1. Unable to pick up and needs supervision 0. Unable/needs assistance to keep from falling  Turning to look behind: Numbers; 0-4: 4  4. Looks behind from both sides and weight shifts well  3. Looks behind one side only, other side less weight shift  2. Turns sideways only, maintains balance  1. Needs supervision when turning  0. Needs assistance  Turning 360 degrees: Numbers; 0-4: 2  4. Able to turn in </=4 seconds  3. Able to turn on one side in </= 4 seconds   2. Able to turn slowly, but safely  1. Needs supervision or verbal cueing  0. Needs assistance Place alternate foot on stool: Numbers; 0-4: 2 4. Completes 8 steps in 20 seconds 3. Completes 8 steps in >20 seconds 2. 4 steps without assistance/supervision 1. Completes >2 steps with minimal assist 0. Unable, needs assist to keep from falling Standing with one foot in front: Numbers; 0-4: 3  4. Independent tandem for 30 seconds  3. Independent foot ahead for 30 seconds  2. Independent small step for 30 seconds  1. Needs help to step, but can hold for 15 seconds  0. Loses balance while standing/stepping Standing on one foot: Numbers; 0-4: 1 4. Holds >10 seconds 3. Holds 5-10 seconds 2. Holds >/=3 seconds  1. Holds <3 seconds 0. Unable   Total Score: 47/56    PATIENT SURVEYS:  FOTO 42  TODAY'S TREATMENT:  Eval   PATIENT EDUCATION: Education details: Discussed eval findings, rehab  rationale and POC and patient is in agreement  Person educated: Patient and Caregiver Education method: Explanation Education comprehension: verbalized understanding   HOME EXERCISE PROGRAM: TBD  ASSESSMENT:  CLINICAL IMPRESSION: Patient is a 86 y.o. male who was seen today for physical therapy evaluation and treatment for ataxia/frequent falls. Patient demos good LE ROM and functional strength, able to hold all positions on mCTSIB for 30s, 5x STS score below functional limits and BERG score shows balance deficits.   UTA BPPV due to time constraints. Objective impairments include Abnormal gait, decreased activity tolerance, decreased balance, decreased coordination, decreased endurance, decreased knowledge of condition, decreased knowledge of use of DME, decreased mobility, and difficulty walking. These impairments are limiting patient from community activity, driving, and [employer] program . Personal factors including Age, Fitness, and pending MRI findings  are also affecting patient's functional outcome. Patient will benefit from skilled PT to address  above impairments and improve overall function.  REHAB POTENTIAL: Good  CLINICAL DECISION MAKING: Evolving/moderate complexity  EVALUATION COMPLEXITY: Moderate   GOALS: Goals reviewed with patient? Yes  SHORT TERM GOALS:  STG Name Target Date Goal status  1 Patient to demonstrate independence in HEP  Baseline: TBD 09/02/2021 INITIAL  2 Assess DHP for BPPV or vertigo symptoms Baseline: TBD 09/02/2021 INITIAL  LONG TERM GOALS:   LTG Name Target Date Goal status  1 Increase BERG score to 52 Baseline: Initial BERG 47 09/16/2021 INITIAL  2 Improve 5x STS to 15s or less Baseline: Initial STS score 21s 09/16/2021 INITIAL  3 Patient to report no falls or LOB for 1 month Baseline: 6 falls in 5 months 09/16/2021 INITIAL  4 Patient to demo a functional score on FGA Baseline: TBD 09/16/2021 INITIAL  PLAN: PT FREQUENCY: 2x/week  PT DURATION: 4  weeks  PLANNED INTERVENTIONS: Therapeutic exercises, Therapeutic activity, Neuro Muscular re-education, Balance training, Gait training, Patient/Family education, Joint mobilization, Stair training, Vestibular training, Canalith repositioning, and DME instructions  PLAN FOR NEXT SESSION: Assess DHP and BPPV   Lanice Shirts PT 08/19/2021, 2:05 PM  [place] [address] [place], [place], [address] Phone: [phone]   Fax:  [phone]

## 2021-08-22 ENCOUNTER — Telehealth: Payer: Self-pay | Admitting: Family Medicine

## 2021-08-22 ENCOUNTER — Telehealth: Payer: Self-pay | Admitting: Pharmacist

## 2021-08-22 NOTE — Telephone Encounter (Signed)
Left message for Joelene Millin to call back and schedule Medicare Annual Wellness Visit (AWV) in office for patient.   If not able to come in office, please offer to do virtually or by telephone.  Left office number and my jabber [phone].  Last AWV:06/07/2020  Please schedule at anytime with Nurse Health Advisor.

## 2021-08-22 NOTE — Progress Notes (Addendum)
Chronic Care Management Pharmacy Assistant   Name: Antonio Clark  MRN: [id number] DOB: 04-23-1936   Reason for Encounter: Disease State - Hypertension Call    Recent office visits:  None noted.   Recent consult visits:  06/26/21 Wayne Both ,MD - Dermatology - Squamous cell carcinoma - skin biopsy and lesion extraction done. Follow up as scheduled.   06/19/21 Lenoria Chime - Urology - No notes available.   Hospital visits: 07/03/21 Medication Reconciliation was completed by comparing discharge summary, patients EMR and Pharmacy list, and upon discussion with patient.  Admitted to the hospital on 07/03/21 due to Fall. Discharge date was 07/03/21. Discharged from [facility]?Medications Started at [facility] Discharge:?? HYDROcodone-acetaminophen ([place]) 5-325 MG tablet  Medication Changes at Hospital Discharge: None noted  Medications Discontinued at Hospital Discharge: None noted  Medications that remain the same after Hospital Discharge:??  All other medications will remain the same.    Medications: Outpatient Encounter Medications as of 08/22/2021  Medication Sig   atorvastatin (LIPITOR) 40 MG tablet Take 40 mg by mouth daily.   cetirizine (ZYRTEC) 10 MG tablet Take 10 mg by mouth daily as needed for allergies.   escitalopram (LEXAPRO) 10 MG tablet TAKE 1 TABLET BY MOUTH EVERY DAY   fluticasone (FLONASE) 50 MCG/ACT nasal spray Place 1 spray into both nostrils daily as needed for allergies or rhinitis.   linagliptin (TRADJENTA) 5 MG TABS tablet Take 1 tablet (5 mg total) by mouth daily.   LORazepam (ATIVAN) 0.5 MG tablet TAKE 1 TABLET BY MOUTH EVERY DAY AS NEEDED FOR ANXIETY   metoprolol tartrate (LOPRESSOR) 25 MG tablet Take 1 tablet (25 mg total) by mouth 2 (two) times daily.   pantoprazole (PROTONIX) 40 MG tablet TAKE 1 TABLET BY MOUTH EVERY DAY   PRADAXA 150 MG CAPS capsule TAKE 1 CAPSULE BY MOUTH TWICE A DAY   sucralfate (CARAFATE) 1 g  tablet TAKE 1 TABLET BY MOUTH TWICE A DAY   No facility-administered encounter medications on file as of 08/22/2021.   Current antihypertensive regimen:  Metoprolol 25 mg 1 tablets by mouth 2 (two) times daily  How often are you checking your Blood Pressure?  Patient reported checking blood pressure once weekly.   Current home BP readings: did not have any to report was at PT during call.    What recent interventions/DTPs have been made by any provider to improve Blood Pressure control since last CPP Visit:  Patient reported he is now taking 1 whole tablet Metoprolol twice a day and no longer taking Valsartan.   Any recent hospitalizations or ED visits since last visit with CPP?  Patient had an ED visit for a Fall on 07/03/21.   What diet changes have been made to improve Blood Pressure Control?  Patient reported he tried to be careful with his salt intake in his diet.    What exercise is being done to improve your Blood Pressure Control?  Patient is currently doing PT but otherwise does not do any regular exercise.     Adherence Review: Is the patient currently on ACE/ARB medication? Yes Does the patient have >5 day gap between last estimated fill dates? No   Care Gaps  AWV: done 08/22/21 Colonoscopy: done 10/11/20 DM Eye Exam: unknown  DM Foot Exam: due 08/16/20 Microalbumin: done 12/17/20 HbgAIC: done 05/05/21 (6.6) DEXA: N/A Mammogram: N/A  Star Rating Drugs: Atorvastatin 40 mg - last filled 06/06/21 90 days  Januvia 100 mg -  last filled  Valsartan - last filled 04/01/21 90 days  Tradjenta 5 mg - last filled 08/03/21 30 days   Future Appointments  Date Time Provider [place]  09/01/2021  3:30 PM Cathie Beams [facility]  09/03/2021  2:45 PM Lanice Shirts, PT [facility]  09/08/2021  3:30 PM Lanice Shirts, PT [facility]  09/10/2021  2:45 PM Lanice Shirts, PT [facility]  09/15/2021  3:30 PM Lanice Shirts, PT [facility]   09/17/2021  2:45 PM Lanice Shirts, PT [facility]  09/22/2021  3:30 PM Lanice Shirts, PT [facility]  09/24/2021  2:45 PM Lanice Shirts, PT [facility]  11/11/2021  1:20 PM Jerline Pain, MD [facility]  01/06/2022  2:15 PM Lavonna Monarch, MD [facility] [place] [place] Pharmacist Assistant  [phone]  7 minutes spent in review, coordination, and documentation.  Reviewed by: Beverly Milch, PharmD Clinical Pharmacist [phone]

## 2021-09-01 ENCOUNTER — Ambulatory Visit: Payer: Medicare Other

## 2021-09-01 ENCOUNTER — Other Ambulatory Visit: Payer: Self-pay

## 2021-09-01 DIAGNOSIS — M6281 Muscle weakness (generalized): Secondary | ICD-10-CM

## 2021-09-01 DIAGNOSIS — R2689 Other abnormalities of gait and mobility: Secondary | ICD-10-CM

## 2021-09-01 DIAGNOSIS — R2681 Unsteadiness on feet: Secondary | ICD-10-CM

## 2021-09-01 NOTE — Therapy (Addendum)
OUTPATIENT PHYSICAL THERAPY TREATMENT NOTE   Patient Name: CORNEL WERBER MRN: [id number] DOB:1936/05/13, 86 y.o., male Today's Date: 09/01/2021  PCP: Susy Frizzle, MD REFERRING PROVIDER: Susy Frizzle, MD PHYSICAL THERAPY DISCHARGE SUMMARY  Visits from Start of Care: 2  Current functional level related to goals / functional outcomes: Transferred to neurorehab   Remaining deficits: balance   Education / Equipment: HEP   Patient agrees to discharge. Patient goals were met. Patient is being discharged due to being pleased with the current functional level.   PT End of Session - 09/01/21 1527     Visit Number 2    Number of Visits 8    Date for PT Re-Evaluation 09/16/21    Authorization Type [employer] MCR    Progress Note Due on Visit 8    PT Start Time 9381    PT Stop Time 1615    PT Time Calculation (min) 45 min    Activity Tolerance Patient tolerated treatment well    Behavior During Therapy [facility] for tasks assessed/performed             Past Medical History:  Diagnosis Date   Anal fissure    Arthritis    Atrial fibrillation ([place])    Back pain    Colon polyps    Diabetes mellitus type 2 with complications ([place])    Dysrhythmia    a-fib   GERD (gastroesophageal reflux disease)    History of echocardiogram 07/ 07/ 2011   History of lithotripsy 1989   Hyperlipidemia    Hypertension    Kidney stones    Melanoma ([place]) 05/01/2019   right chest wall (12/20)   Prostate CA ([place]) 2010   Sleep apnea    uses CPAP nightly   Squamous cell carcinoma of skin 05/23/1992   bowens-left parietal scalp (CX35FU)   Squamous cell carcinoma of skin 03/14/2008   in situ-left upper outer forehead-medial (CX35FU)   Squamous cell carcinoma of skin 03/14/2008   in situ-crown of scalp (Cx35FU)   Squamous cell carcinoma of skin 04/15/2011   in situ-right dorsal forearm (txpbx)   Squamous cell carcinoma of skin 10/16/2011   in situ-left sideburn   Squamous cell carcinoma of  skin 03/11/2015   ka-left sideburn (CX35FU)   Squamous cell carcinoma of skin 03/11/2015   ka-left forearm (CX35FU)   Squamous cell carcinoma of skin 06/22/2018   in situ-left forearm, sup (txpbx)   Squamous cell carcinoma of skin 05/01/2019   in situ-mid anterior scalp    Squamous cell carcinoma of skin 05/01/2019   in situ-right upper arm   Past Surgical History:  Procedure Laterality Date   ATRIAL FIBRILLATION ABLATION N/A 04/17/2020   Procedure: [place];  Surgeon: Vickie Epley, MD;  Location: [place] [facility];  Service: Cardiovascular;  Laterality: N/A;   AXILLARY SENTINEL NODE BIOPSY Right 08/23/2019   Procedure: SENTINEL LYMPH NODE BIOSPY RIGHT AXILLA;  Surgeon: Stark Klein, MD;  Location: [place];  Service: General;  Laterality: Right;   CARDIOVERSION N/A 05/10/2020   Procedure: CARDIOVERSION;  Surgeon: Freada Bergeron, MD;  Location: [facility] ENDOSCOPY;  Service: Cardiovascular;  Laterality: N/A;   CARDIOVERSION N/A 05/08/2021   Procedure: CARDIOVERSION;  Surgeon: Donato Heinz, MD;  Location: [facility] ENDOSCOPY;  Service: Cardiovascular;  Laterality: N/A;   COLONOSCOPY  2002   FASCIECTOMY Left 03/28/2019   Procedure: SEGMENTAL FASCIECTOMY LEFT RING FINGER;  Surgeon: Daryll Brod, MD;  Location: [place];  Service:  Orthopedics;  Laterality: Left;  ANESTHESIA  AXILLARY BLOCK   INSERTION PROSTATE RADIATION SEED  8 4 2010   per Dr Rosana Hoes   MELANOMA EXCISION Right 06/22/2019   Procedure: WIDE LOCAL EXCISION RIGHT CHEST WALL MELANOMA, ADVANCEMENT FLAP CLOSURE FOR DEFECT 3X6 CM;  Surgeon: Stark Klein, MD;  Location: [place];  Service: General;  Laterality: Right;   POLYPECTOMY  2002   THROAT SURGERY  1980   benign cyst   UPPER GASTROINTESTINAL ENDOSCOPY     URETHRAL STRICTURE DILATATION     also penile implant   Patient Active Problem List   Diagnosis Date Noted   Orthostatic hypotension 05/02/2021    Melanoma ([place])    Atrial fibrillation ([place])    Malignant neoplasm of prostate ([place]) 07/15/2015   Recurrent nephrolithiasis 07/15/2015   Personal history of prostate cancer 04/25/2015   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/09/2013   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  Continues to deny dizziness, nausea, visual disturbances or vertigo symptoms.  Does note some symptoms with reading and visual tracking as well as turning when ambulating.  PAIN:  Are you having pain? No NPRS scale: 0/10  OBJECTIVE:    DIAGNOSTIC FINDINGS: Ataxia Proceed with a chest x-ray to rule out an underlying pneumonia.  Meanwhile treat bronchospasms with prednisone.  I do not feel that he needs albuterol right now especially given his history of atrial fibrillation.  If the prednisone clears the wheezing and the chest x-ray is clear, no further work-up is necessary however if the chest x-ray shows any underlying interstitial lung disease, the patient may benefit from pulmonology consultation for pulmonary function test.  I believe the ataxia is likely due to chronic microvascular disease affecting the cerebellum.  This will have to be managed by controlling his risk factors including hypertension hyperlipidemia and diabetes.  However I would recommend physical therapy and I will consult [place] [facility] for this.   COGNITION: Overall cognitive status: Within functional limits for tasks assessed   SENSATION: Light touch:  Appears intact     POSTURE: rounded shoulders, forward head, decreased lumbar lordosis, and posterior pelvic tilt   AROM/PROM:(WFL throughout)   A/PROM Right 08/19/2021 Left 08/19/2021  Hip flexion      Hip abduction      Hip adduction      Hip internal rotation      Hip external rotation      Knee flexion      Knee extension      Ankle dorsiflexion      Ankle plantarflexion      Ankle inversion      Ankle eversion       (Blank rows = not tested) MMT:(WFL throughout)   MMT Right 08/19/2021 Left 08/19/2021  Hip flexion      Hip abduction      Hip adduction      Hip internal rotation      Hip external rotation      Knee flexion      Knee extension  Ankle dorsiflexion      Ankle plantarflexion      Ankle inversion      Ankle eversion      (Blank rows = not tested)     TRANSFERS: Assistive device utilized: Lobbyist  Sit to stand: Modified independence Stand to sit: Modified independence Chair to chair: Modified independence         GAIT: Gait pattern: step through pattern, decreased stride length, and lateral hip instability Distance walked: 236f Assistive device utilized: Quad cane small base Level of assistance: Modified independence Comments: mild lateral drift noted during ambulation   FUNCTIONAL TESTs:  5xSTS time 21s mCTSIB able to hold all poritions x30s BERG BALANCE   Sitting to Standing: Numbers; 0-4: 4           4. Stands without using hands and stabilize independently           3. Stands independently using hands           2. Stands using hands after multiple trials           1. Min A to stand           0. Mod-Max A to stand Standing unsupported: Numbers; 0-4: 4           4. Stands safely for 2 minutes           3. Stands 2 minutes with supervision           2. Stands 30 seconds unsupported           1. Needs several tries to stand unsupported for 30 seconds           0. Unable to stand unsupported for 30 seconds Sitting  unsupported: Numbers; 0-4: 4           4. Sits for 2 minutes independently           3. Sits for 2 minutes with supervision           2. Able to sit 30 seconds           1. Able to sit 10 seconds           0. Unable to sit for 10 seconds Standing to Sitting: Numbers; 0-4: 4 4. Sits safely with minimal use of hands 3. Controls descent with hands 2. Uses back of legs against chair to control descent 1. Sits independently, but uncontrolled descent 0. Needs assistance Transfers: Numbers; 0-4: 3           4. Transfers safely with minor use of hands           3. Transfers safely definite use of hands           2. Transfers with verbal cueing/supervision           1. Needs 1 person assist           0. Needs 2 person assist  Standing with eyes closed: Numbers; 0-4: 4           4. Stands safely for 10 seconds           3. Stands 10 seconds with supervision            2. Able to stand for 3 seconds           1. Unable to keep eyes closed for 3 seconds, but is safe           0.  Needs assist to keep from falling Standing with feet together: Numbers; 0-4: 4 4. Stands for 1 minute safely 3. Stands for 1 minute with supervision 2. Unable to hold for 30 seconds           1. Needs help to attain position but can hold for 15 seconds           0. Needs help to attain position and unable to hold for 15 seconds Reaching forward with outstretched arm: Numbers; 0-4: 4           4. Reaches forward 10 inches           3. Reaches forward 5 inches           2. Reaches forward 2 inches           1. Reaches forward with supervision           0. Loses balance/requires assistace Retrieving object from the floor: Numbers; 0-4: 4 4. Able to pick up easily and safely 3. Able to pick up with supervision 2. Unable to pick up, but reaches within 1-2 inches independently 1. Unable to pick up and needs supervision 0. Unable/needs assistance to keep from falling  Turning to look behind: Numbers; 0-4: 4            4. Looks behind from both sides and weight shifts well           3. Looks behind one side only, other side less weight shift           2. Turns sideways only, maintains balance           1. Needs supervision when turning           0. Needs assistance  Turning 360 degrees: Numbers; 0-4: 2           4. Able to turn in </=4 seconds           3. Able to turn on one side in </= 4 seconds            2. Able to turn slowly, but safely           1. Needs supervision or verbal cueing           0. Needs assistance Place alternate foot on stool: Numbers; 0-4: 2 4. Completes 8 steps in 20 seconds 3. Completes 8 steps in >20 seconds 2. 4 steps without assistance/supervision 1. Completes >2 steps with minimal assist 0. Unable, needs assist to keep from falling Standing with one foot in front: Numbers; 0-4: 3           4. Independent tandem for 30 seconds           3. Independent foot ahead for 30 seconds           2. Independent small step for 30 seconds           1. Needs help to step, but can hold for 15 seconds           0. Loses balance while standing/stepping Standing on one foot: Numbers; 0-4: 1 4. Holds >10 seconds 3. Holds 5-10 seconds 2. Holds >/=3 seconds  1. Holds <3 seconds 0. Unable    Total Score: 47/56      PATIENT SURVEYS:  FOTO 42   TODAY'S TREATMENT:   [facility] Adult PT Treatment:  DATE: 09/01/21 Therapeutic Activity: Assessed DHP B w/o symptom exacerbation   09/01/21 0001  Dynamic Gait Index  Level Surface 3  Change in Gait Speed 3  Gait with Horizontal Head Turns 3  Gait with Vertical Head Turns 2  Gait and Pivot Turn 3  Step Over Obstacle 3  Step Around Obstacles 3  Steps 2  Total Score 22        PATIENT EDUCATION: Education details: Discussed eval findings, rehab rationale and POC and patient is in agreement  Person educated: Patient and Caregiver Education method: Explanation Education comprehension:  verbalized understanding     HOME EXERCISE PROGRAM: TBD   ASSESSMENT:   CLINICAL IMPRESSION: Patient sustained 1 fall since last session but cannot describe reason, not witnessed by CGs either.  Continues to deny vertigo, imbalance, nausea or environmental disturbances.  DHP did not elicit symptoms, DGI scores with functional limits, appears to some saccadic eye movements with DHP and visual tracking.  Recommend transfer to neurorehab for more in depth assessment of potential vestibular issues.   REHAB POTENTIAL: Good   CLINICAL DECISION MAKING: Evolving/moderate complexity   EVALUATION COMPLEXITY: Moderate     GOALS: Goals reviewed with patient? Yes   SHORT TERM GOALS:   STG Name Target Date Goal status  1 Patient to demonstrate independence in HEP  Baseline: TBD 09/02/2021 INITIAL  2 Assess DHP for BPPV or vertigo symptoms Baseline: TBD 09/02/2021 INITIAL  LONG TERM GOALS:    LTG Name Target Date Goal status  1 Increase BERG score to 52 Baseline: Initial BERG 47 09/16/2021 INITIAL  2 Improve 5x STS to 15s or less Baseline: Initial STS score 21s 09/16/2021 INITIAL  3 Patient to report no falls or LOB for 1 month Baseline: 6 falls in 5 months 09/16/2021 INITIAL  4 Patient to demo a functional score on DGI Baseline: 22 09/16/2021 Revised and met  PLAN: PT FREQUENCY: 2x/week   PT DURATION: 4 weeks   PLANNED INTERVENTIONS: Therapeutic exercises, Therapeutic activity, Neuro Muscular re-education, Balance training, Gait training, Patient/Family education, Joint mobilization, Stair training, Vestibular training, Canalith repositioning, and DME instructions   PLAN FOR NEXT SESSION: transfer to neurorehab for vestibular assessment    Lanice Shirts, PT 09/01/2021, 3:28 PM

## 2021-09-02 DIAGNOSIS — M19049 Primary osteoarthritis, unspecified hand: Secondary | ICD-10-CM | POA: Insufficient documentation

## 2021-09-03 ENCOUNTER — Ambulatory Visit: Payer: Medicare Other

## 2021-09-04 ENCOUNTER — Encounter: Payer: Medicare Other | Admitting: Dermatology

## 2021-09-04 ENCOUNTER — Other Ambulatory Visit: Payer: Self-pay | Admitting: Family Medicine

## 2021-09-09 ENCOUNTER — Telehealth: Payer: Self-pay | Admitting: Family Medicine

## 2021-09-09 NOTE — Telephone Encounter (Signed)
Left message for patient to call back and schedule Medicare Annual Wellness Visit (AWV) in office.   If not able to come in office, please offer to do virtually or by telephone.  Left office number and my jabber [phone].  Last AWV:06/07/2020  Please schedule at anytime with Nurse Health Advisor.

## 2021-09-10 ENCOUNTER — Ambulatory Visit: Payer: Medicare Other

## 2021-09-11 ENCOUNTER — Other Ambulatory Visit: Payer: Self-pay

## 2021-09-11 ENCOUNTER — Ambulatory Visit: Payer: Medicare Other | Attending: Family Medicine

## 2021-09-11 DIAGNOSIS — M6281 Muscle weakness (generalized): Secondary | ICD-10-CM | POA: Diagnosis present

## 2021-09-11 DIAGNOSIS — R2681 Unsteadiness on feet: Secondary | ICD-10-CM | POA: Diagnosis not present

## 2021-09-11 DIAGNOSIS — R2689 Other abnormalities of gait and mobility: Secondary | ICD-10-CM | POA: Insufficient documentation

## 2021-09-11 NOTE — Therapy (Addendum)
OUTPATIENT PHYSICAL THERAPY RE-EVAL/RE-CERT   Patient Name: Antonio Clark MRN: [id number] DOB:1935/12/27, 86 y.o., male Today's Date: 09/12/2021  PCP: Susy Frizzle, MD REFERRING PROVIDER: Susy Frizzle, MD    PT End of Session - 09/11/21 1401     Visit Number 3    Number of Visits 12    Date for PT Re-Evaluation 10/31/21   extended due to availabilty with scheduling   Authorization Type [facility]    Progress Note Due on Visit 10    PT Start Time 1400    PT Stop Time 1442    PT Time Calculation (min) 42 min    Activity Tolerance Patient tolerated treatment well    Behavior During Therapy [name] for tasks assessed/performed              Past Medical History:  Diagnosis Date   Anal fissure    Arthritis    Atrial fibrillation ([place])    Back pain    Colon polyps    Diabetes mellitus type 2 with complications ([place])    Dysrhythmia    a-fib   GERD (gastroesophageal reflux disease)    History of echocardiogram 07/ 07/ 2011   History of lithotripsy 1989   Hyperlipidemia    Hypertension    Kidney stones    Melanoma ([place]) 05/01/2019   right chest wall (12/20)   Prostate CA ([place]) 2010   Sleep apnea    uses CPAP nightly   Squamous cell carcinoma of skin 05/23/1992   bowens-left parietal scalp (CX35FU)   Squamous cell carcinoma of skin 03/14/2008   in situ-left upper outer forehead-medial (CX35FU)   Squamous cell carcinoma of skin 03/14/2008   in situ-crown of scalp (Cx35FU)   Squamous cell carcinoma of skin 04/15/2011   in situ-right dorsal forearm (txpbx)   Squamous cell carcinoma of skin 10/16/2011   in situ-left sideburn   Squamous cell carcinoma of skin 03/11/2015   ka-left sideburn (CX35FU)   Squamous cell carcinoma of skin 03/11/2015   ka-left forearm (CX35FU)   Squamous cell carcinoma of skin 06/22/2018   in situ-left forearm, sup (txpbx)   Squamous cell carcinoma of skin 05/01/2019   in situ-mid anterior scalp    Squamous cell carcinoma of skin  05/01/2019   in situ-right upper arm   Past Surgical History:  Procedure Laterality Date   ATRIAL FIBRILLATION ABLATION N/A 04/17/2020   Procedure: [place];  Surgeon: Vickie Epley, MD;  Location: [place] CV LAB;  Service: Cardiovascular;  Laterality: N/A;   AXILLARY SENTINEL NODE BIOPSY Right 08/23/2019   Procedure: SENTINEL LYMPH NODE BIOSPY RIGHT AXILLA;  Surgeon: Stark Klein, MD;  Location: [place];  Service: General;  Laterality: Right;   CARDIOVERSION N/A 05/10/2020   Procedure: CARDIOVERSION;  Surgeon: Freada Bergeron, MD;  Location: [facility] ENDOSCOPY;  Service: Cardiovascular;  Laterality: N/A;   CARDIOVERSION N/A 05/08/2021   Procedure: CARDIOVERSION;  Surgeon: Donato Heinz, MD;  Location: [facility] ENDOSCOPY;  Service: Cardiovascular;  Laterality: N/A;   COLONOSCOPY  2002   FASCIECTOMY Left 03/28/2019   Procedure: SEGMENTAL FASCIECTOMY LEFT RING FINGER;  Surgeon: Daryll Brod, MD;  Location: [place];  Service: Orthopedics;  Laterality: Left;  ANESTHESIA  AXILLARY BLOCK   INSERTION PROSTATE RADIATION SEED  8 4 2010   per Dr Rosana Hoes   MELANOMA EXCISION Right 06/22/2019   Procedure: WIDE LOCAL EXCISION RIGHT CHEST WALL MELANOMA, ADVANCEMENT FLAP CLOSURE FOR DEFECT 3X6 CM;  Surgeon: Barry Dienes,  Dorris Fetch, MD;  Location: [place];  Service: General;  Laterality: Right;   POLYPECTOMY  2002   THROAT SURGERY  1980   benign cyst   UPPER GASTROINTESTINAL ENDOSCOPY     URETHRAL STRICTURE DILATATION     also penile implant   Patient Active Problem List   Diagnosis Date Noted   Orthostatic hypotension 05/02/2021   Melanoma ([place])    Atrial fibrillation ([place])    Malignant neoplasm of prostate ([place]) 07/15/2015   Recurrent nephrolithiasis 07/15/2015   Personal history of prostate cancer 04/25/2015   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/09/2013   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   Coronary artery disease  11/02/2011   Urethral stricture 06/22/2011   Hyperlipidemia    Borderline diabetic    Diabetes mellitus with coincident hypertension ([place])     ONSET DATE: 08/06/2021   REFERRING DIAG: Diagnosis M16.11 (ICD-10-CM) - Primary osteoarthritis of right hip R26.81 (ICD-10-CM) - Unsteadiness on feet R26.89 (ICD-10-CM) - Balance problem M54.50 (ICD-10-CM) - Low back pain at multiple sites   THERAPY DIAG:  Unsteadiness on feet - Plan: PT plan of care cert/re-cert  Muscle weakness (generalized) - Plan: PT plan of care cert/re-cert  Other abnormalities of gait and mobility - Plan: PT plan of care cert/re-cert  SUBJECTIVE:   SUBJECTIVE :  Patient was originally receiving PT services at [place], and was transferred to [facility] Clinic. Patient reports that he had MRI brain of this was normal. Back imaging showed degenerative changes. Denies dizziness. Reports more imbalance, at times feels like he can't keep up with his feet and feels as if he is going to fall forward. Most recent fall in December, injured the elbow. Is not ambulating with a cane right now, but does take it at times when he is going somewhere by himself.                                                                                                                                                                                   PERTINENT HISTORY: Ataxia, Arthritis, Back Pain, DM Type 2, GERD, HLD, HTN, Melanoma, Prostate CA, Squamous cell carcinoma of skin  PAIN:  Are you having pain? No VAS scale: 0/10  PRECAUTIONS: Fall  WEIGHT BEARING RESTRICTIONS No  FALLS: Has patient fallen in last 6 months? Yes, Number of falls: 10  LIVING ENVIRONMENT: Lives with: lives alone Lives in: House/apartment Stairs: Yes; Internal: 2 steps; on right going up and External: 3 steps; on right going up (reports primarily comes in through garage, no rails) Has following equipment at home: Quad cane small base  PLOF:  Independent  PATIENT GOALS: To improve my balance and  prevent further falls; Get back to walking at the [facility] (2-3 miles, 3x/week)   OBJECTIVE:   COGNITION: Overall cognitive status: Within functional limits for tasks assessed  SENSATION: Light touch: Appears intact   POSTURE: rounded shoulders, forward head, decreased lumbar lordosis, and posterior pelvic tilt  MMT:(WFL throughout)  MMT Right 09/12/2021 Left 09/12/2021  Hip flexion 4-/5 4/5  Hip abduction    Hip adduction    Hip internal rotation    Hip external rotation    Knee flexion 4/5 4/5  Knee extension 4/5 4/5  Ankle dorsiflexion 4-/5 4-/5  Ankle plantarflexion    Ankle inversion    Ankle eversion    (Blank rows = not tested)   TRANSFERS: Assistive device utilized: Quad cane small base and None Ambulating without device into session today Sit to stand: SBA 5x sit <> stand test:  17.25 seconds with BUE support from standard chair  Stand to sit: SBA  GAIT: Gait pattern: step through pattern and decreased stride length Distance walked: 123ft Assistive device utilized: Quad cane small base and None Level of assistance: SBA Comments: mild veering noted during ambulation Gait Speed: 11.29 secs without AD = 2.9 ft/sec   Stairs:   Able to ascend/descend with bil rails and alternating pattern Mod I. Without use of rails, patient CGA and unsteadiness noted with step to pattern required due to imbalance.   TUG:   Normal: 11.63 seconds  Manual TUG: 12.09 seconds  Cog TUG: 15.23 seconds  M-CTSIB:   Able to complete M-CTSIB situation 1-3 for full 30 seconds; situation 4: 20 seconds (increased postural sway noted with vision removed)        PATIENT EDUCATION: Education details: Updated POC/Fall Risk Person educated: Patient and Caregiver Education method: Explanation Education comprehension: verbalized understanding   HOME EXERCISE PROGRAM: Will be established at next visit  ASSESSMENT:  CLINICAL  IMPRESSION: Patient is a 86 y.o. male who was seen recently evaluated for ataxia/frequent falls, and has been transitioned to the Neuro Clinic at this time.  Objective impairments include Abnormal gait, decreased activity tolerance, decreased balance, decreased coordination, decreased endurance, decreased knowledge of use of DME, difficulty walking, decreased strength, postural dysfunction, and pain. Patient is currently ambulating at 2.9 ft/sec indicating community Ambulator. With FGA, patient scored 19/30 indicating increased fall risk. Patient also demonstrating increased fall risk with TUG time (most challenge noted with cog tug), and 5x sit<> stand. These impairments are limiting patient from community activity, driving, and [employer] program . Personal factors including Age, Time since onset of injury/illness/exacerbation, and 3+ comorbidities: Ataxia, Arthritis, Back Pain, DM Type 2, GERD, HLD, HTN, Melanoma, Prostate CA, Squamous cell carcinoma of skin  are also affecting patient's functional outcome. Patient will benefit from skilled PT to address above impairments and improve overall function.  REHAB POTENTIAL: Good  CLINICAL DECISION MAKING: Evolving/moderate complexity  EVALUATION COMPLEXITY: Moderate   GOALS: Goals reviewed with patient? Yes  UPDATED SHORT TERM GOALS:  STG Name Target Date Goal status  1 Patient will demonstrate independence with initial balance HEP Baseline: no HEP established 10/03/2021 INITIAL  2 Patient will improve 5x sit <> stand to </= 15 seconds to demo improved balance Baseline: 17.25 secs 10/03/2021 INITIAL    UPDATED LONG TERM GOALS:   LTG Name Target Date Goal status  1 Pt will improve FGA to >/= 23 to demonstrate improved balance and reduced fall risk Baseline: 19/20 10/31/2021 INITIAL  2 Pt will improve 5x STS to </= 12 seconds to demo improved  balance Baseline: 17.25 secs with BUE support 10/31/2021 INITIAL  3 Patient will verbalize understanding of  fall prevention to improve safety within the home and reduce fall risk Baseline: 6 falls in 5 months 10/31/2021 INITIAL  4 Patient will improve situation 4 of M-CTSIB to >/= 25 seconds Baseline: 20 seconds 10/31/2021 INITIAL  5 Patient will improve TUG to </= 10 seconds for decreased fall risk Baseline: 11.63 seconds 10/31/2021 INITIAL     PLAN: PT FREQUENCY: 2x/week  PT DURATION: other: 3 weeks, then 1x/week for 3 weeks  PLANNED INTERVENTIONS: Therapeutic exercises, Therapeutic activity, Neuro Muscular re-education, Balance training, Gait training, Patient/Family education, Joint mobilization, Stair training, Vestibular training, Canalith repositioning, and DME instructions  PLAN FOR NEXT SESSION: Initiate HEP focused on standing balance/strengthening   Jones Bales PT, DPT 09/12/2021, 9:47 AM  [facility] [address] [place], [place], [address] Phone: [phone]   Fax:  [phone]

## 2021-09-15 ENCOUNTER — Ambulatory Visit: Payer: Medicare Other

## 2021-09-15 ENCOUNTER — Other Ambulatory Visit: Payer: Self-pay

## 2021-09-15 DIAGNOSIS — R2689 Other abnormalities of gait and mobility: Secondary | ICD-10-CM

## 2021-09-15 DIAGNOSIS — R2681 Unsteadiness on feet: Secondary | ICD-10-CM | POA: Diagnosis not present

## 2021-09-15 DIAGNOSIS — M6281 Muscle weakness (generalized): Secondary | ICD-10-CM

## 2021-09-15 NOTE — Patient Instructions (Addendum)
WALKING  Walking is a great form of exercise to increase your strength, endurance and overall fitness.  A walking program can help you start slowly and gradually build endurance as you go.  Everyone's ability is different, so each person's starting point will be different.  You do not have to follow them exactly.  The are just samples. You should simply find out what's right for you and stick to that program.   In the beginning, you'll start off walking 2-3 times a day for short distances.  As you get stronger, you'll be walking further at just 1-2 times per day.  A. You Can Walk For A Certain Length Of Time Each Day    Walk 6 minutes 2 times per day.  Increase 1-2 minutes every 3-4 days.  Work up to 25-30 minutes (1-2 times per day).   Example:   Day 1-2 6 minutes 2 times per day   Day 7-8 7-8 minutes 2-3 times per day   Day 13-14 10-12 minutes 1-2 times per day  B. You Can Walk For a Certain Distance Each Day     Distance can be substituted for time.    Example:   Driveway ([address]) x 9 times/laps. Then every 1-2 days increase by 1 lap.

## 2021-09-15 NOTE — Therapy (Signed)
OUTPATIENT PHYSICAL THERAPY TREATMENT   Patient Name: Antonio Clark MRN: [id number] DOB:Feb 16, 1936, 86 y.o., male Today's Date: 09/15/2021  PCP: Susy Frizzle, MD REFERRING PROVIDER: Susy Frizzle, MD    PT End of Session - 09/15/21 1450     Visit Number 4    Number of Visits 12    Date for PT Re-Evaluation 10/31/21   extended due to availabilty with scheduling   Authorization Type [facility] [facility]    Progress Note Due on Visit 10    PT Start Time 1450   Pt arriving late   PT Stop Time 1530    PT Time Calculation (min) 40 min    Activity Tolerance Patient tolerated treatment well    Behavior During Therapy [facility] for tasks assessed/performed               Past Medical History:  Diagnosis Date   Anal fissure    Arthritis    Atrial fibrillation ([place])    Back pain    Colon polyps    Diabetes mellitus type 2 with complications ([place])    Dysrhythmia    a-fib   GERD (gastroesophageal reflux disease)    History of echocardiogram 07/ 07/ 2011   History of lithotripsy 1989   Hyperlipidemia    Hypertension    Kidney stones    Melanoma ([place]) 05/01/2019   right chest wall (12/20)   Prostate CA ([place]) 2010   Sleep apnea    uses CPAP nightly   Squamous cell carcinoma of skin 05/23/1992   bowens-left parietal scalp (CX35FU)   Squamous cell carcinoma of skin 03/14/2008   in situ-left upper outer forehead-medial (CX35FU)   Squamous cell carcinoma of skin 03/14/2008   in situ-crown of scalp (Cx35FU)   Squamous cell carcinoma of skin 04/15/2011   in situ-right dorsal forearm (txpbx)   Squamous cell carcinoma of skin 10/16/2011   in situ-left sideburn   Squamous cell carcinoma of skin 03/11/2015   ka-left sideburn (CX35FU)   Squamous cell carcinoma of skin 03/11/2015   ka-left forearm (CX35FU)   Squamous cell carcinoma of skin 06/22/2018   in situ-left forearm, sup (txpbx)   Squamous cell carcinoma of skin 05/01/2019   in situ-mid anterior scalp    Squamous cell carcinoma  of skin 05/01/2019   in situ-right upper arm   Past Surgical History:  Procedure Laterality Date   ATRIAL FIBRILLATION ABLATION N/A 04/17/2020   Procedure: [place];  Surgeon: Vickie Epley, MD;  Location: [place] CV LAB;  Service: Cardiovascular;  Laterality: N/A;   AXILLARY SENTINEL NODE BIOPSY Right 08/23/2019   Procedure: SENTINEL LYMPH NODE BIOSPY RIGHT AXILLA;  Surgeon: Stark Klein, MD;  Location: [place];  Service: General;  Laterality: Right;   CARDIOVERSION N/A 05/10/2020   Procedure: CARDIOVERSION;  Surgeon: Freada Bergeron, MD;  Location: [facility] ENDOSCOPY;  Service: Cardiovascular;  Laterality: N/A;   CARDIOVERSION N/A 05/08/2021   Procedure: CARDIOVERSION;  Surgeon: Donato Heinz, MD;  Location: [facility] ENDOSCOPY;  Service: Cardiovascular;  Laterality: N/A;   COLONOSCOPY  2002   FASCIECTOMY Left 03/28/2019   Procedure: SEGMENTAL FASCIECTOMY LEFT RING FINGER;  Surgeon: Daryll Brod, MD;  Location: [place];  Service: Orthopedics;  Laterality: Left;  ANESTHESIA  AXILLARY BLOCK   INSERTION PROSTATE RADIATION SEED  8 4 2010   per Dr Rosana Hoes   MELANOMA EXCISION Right 06/22/2019   Procedure: WIDE LOCAL EXCISION RIGHT CHEST WALL MELANOMA, ADVANCEMENT FLAP CLOSURE FOR DEFECT  3X6 CM;  Surgeon: Stark Klein, MD;  Location: [place];  Service: General;  Laterality: Right;   POLYPECTOMY  2002   THROAT SURGERY  1980   benign cyst   UPPER GASTROINTESTINAL ENDOSCOPY     URETHRAL STRICTURE DILATATION     also penile implant   Patient Active Problem List   Diagnosis Date Noted   Orthostatic hypotension 05/02/2021   Melanoma ([place])    Atrial fibrillation ([place])    Malignant neoplasm of prostate ([place]) 07/15/2015   Recurrent nephrolithiasis 07/15/2015   Personal history of prostate cancer 04/25/2015   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/09/2013   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 No falls to report since initial visit. Pt reports scheduled a visit with eye doctor next Tuesday. Feels as if vision is throwing off the balance.                                                                                                                                                                        PERTINENT HISTORY: Ataxia, Arthritis, Back Pain, DM Type 2, GERD, HLD, HTN, Melanoma, Prostate CA, Squamous cell carcinoma of skin  PAIN:  Are you having pain? No VAS scale: 0/10  PRECAUTIONS: Fall  WEIGHT BEARING RESTRICTIONS No  LIVING ENVIRONMENT: Lives with: lives alone Lives in: House/apartment Stairs: Yes; Internal: 2 steps; on right going up and External: 3 steps; on right going up (reports primarily comes in through garage, no rails) Has following equipment at home: Quad cane small base  PLOF: Independent  PATIENT GOALS: To improve my balance and prevent further falls; Get back to walking at the [facility] (2-3 miles, 3x/week)   OBJECTIVE:  PT Treatment:  TRANSFERS: Assistive device utilized: No AD Utilized today during session and with completion of 6MWT Sit to stand: SBA Stand to sit: SBA   GAIT: Gait pattern: step through pattern and decreased stride length Distance walked: 994 ft with 6MWT (See Below)  Assistive device utilized: None Level of assistance: SBA Comments: Completed 6MWT, patient able to  ambulate 994 ft.     Baseline - HR: 96, Sp02: 99%    After Testing - HR: 129.  Sp02: 98%, RPE: 6-7/10  Initiated Walking Program based upon results of 6MWT. Educated on how to properly monitor HR and dose walking program distance/time.    Also completed all of the following exercises and established initial HEP. Educated patient/caregiver on how to properly complete safely within the home. Purchase options for balance pad.   Access Code: [email or web address] URL: [email or web address] Date: 09/15/2021 Prepared by: Baldomero Lamy  Exercises Sit to Stand with Arms Crossed - 1 x daily - 5 x weekly - 2 sets - 10 reps Romberg Stance on Foam Pad with Head Rotation - 1 x daily - 5 x weekly - 2 sets - 10 reps Romberg Stance with Head Nods on Foam Pad - 1 x daily - 5 x weekly - 2 sets - 10 reps Standing Balance with Eyes Closed on Pillow - 1 x daily - 5 x weekly - 1 sets - 3 reps - 30 seconds hold Tandem Stance - 1 x daily - 5 x weekly - 1 sets - 3 reps - 20-25 seconds hold Tandem Walking with Counter Support - 1 x daily - 5 x weekly - 1 sets - 3-4 reps   PATIENT EDUCATION: Education details: Initial HEP Provided/walking Program; How to Monitor HR  Person educated: Patient and Caregiver Education method: Explanation Education comprehension: verbalized understanding   HOME EXERCISE PROGRAM: Access Code: [email or web address]  ASSESSMENT:  CLINICAL IMPRESSION: Today's skilled PT session focused on establishing initial HEP for balance/strength. Patient tolerating all activities well. With 6MWT patient able to ambulate 994 ft without an AD. Utilized this as a starting point for walking program to improve patient's endurance/activity tolerance. Will continue per POC.    REHAB POTENTIAL: Good  CLINICAL DECISION MAKING: Evolving/moderate complexity  EVALUATION COMPLEXITY: Moderate   GOALS: Goals reviewed with patient? Yes  UPDATED SHORT TERM GOALS:  STG Name Target Date Goal status  1 Patient  will demonstrate independence with initial balance HEP Baseline: no HEP established 10/03/2021 INITIAL  2 Patient will improve 5x sit <> stand to </= 15 seconds to demo improved balance Baseline: 17.25 secs 10/03/2021 INITIAL    UPDATED LONG TERM GOALS:   LTG Name Target Date Goal status  1 Pt will improve FGA to >/= 23 to demonstrate improved balance and reduced fall risk Baseline: 19/20 10/31/2021 INITIAL  2 Pt will improve 5x STS to </= 12 seconds to demo improved balance Baseline: 17.25 secs with BUE support 10/31/2021 INITIAL  3 Patient will verbalize understanding of fall prevention to improve safety within the home and reduce fall risk Baseline: 6 falls in 5 months 10/31/2021 INITIAL  4 Patient will improve situation 4 of M-CTSIB to >/= 25 seconds Baseline: 20 seconds 10/31/2021 INITIAL  5 Patient will improve TUG to </= 10 seconds for decreased fall risk Baseline: 11.63 seconds 10/31/2021 INITIAL     PLAN: PT FREQUENCY: 2x/week  PT DURATION: other: 3 weeks, then 1x/week for 3 weeks  PLANNED INTERVENTIONS: Therapeutic exercises, Therapeutic activity, Neuro Muscular re-education, Balance training, Gait training, Patient/Family education, Joint mobilization, Stair training, Vestibular training, Canalith repositioning, and DME instructions  PLAN FOR NEXT SESSION: Review HEP if needed, Continue balance on complaint surfaces; high level balance activities; SLS/obstacle negotiation.    [place], DPT 09/15/2021, 5:06 PM  [facility] [address] [place], [place], [address] Phone: [phone]   Fax:  [phone]

## 2021-09-17 ENCOUNTER — Ambulatory Visit: Payer: Medicare Other | Admitting: Physical Therapy

## 2021-09-17 ENCOUNTER — Ambulatory Visit: Payer: Medicare Other

## 2021-09-17 ENCOUNTER — Other Ambulatory Visit: Payer: Self-pay

## 2021-09-17 DIAGNOSIS — R2681 Unsteadiness on feet: Secondary | ICD-10-CM | POA: Diagnosis not present

## 2021-09-17 DIAGNOSIS — M6281 Muscle weakness (generalized): Secondary | ICD-10-CM

## 2021-09-17 DIAGNOSIS — R2689 Other abnormalities of gait and mobility: Secondary | ICD-10-CM

## 2021-09-17 NOTE — Therapy (Addendum)
OUTPATIENT PHYSICAL THERAPY TREATMENT   Patient Name: Antonio Clark MRN: [id number] DOB:06-09-1936, 86 y.o., male Today's Date: 09/17/2021  PCP: Susy Frizzle, MD REFERRING PROVIDER: Susy Frizzle, MD    PT End of Session - 09/17/21 1619     Visit Number 5    Number of Visits 12    Date for PT Re-Evaluation 10/31/21   extended due to availabilty with scheduling   Authorization Type [facility] [facility]    Progress Note Due on Visit 10    PT Start Time 1618    Activity Tolerance Patient tolerated treatment well    Behavior During Therapy [facility] for tasks assessed/performed                Past Medical History:  Diagnosis Date   Anal fissure    Arthritis    Atrial fibrillation ([place])    Back pain    Colon polyps    Diabetes mellitus type 2 with complications ([place])    Dysrhythmia    a-fib   GERD (gastroesophageal reflux disease)    History of echocardiogram 07/ 07/ 2011   History of lithotripsy 1989   Hyperlipidemia    Hypertension    Kidney stones    Melanoma ([place]) 05/01/2019   right chest wall (12/20)   Prostate CA ([place]) 2010   Sleep apnea    uses CPAP nightly   Squamous cell carcinoma of skin 05/23/1992   bowens-left parietal scalp (CX35FU)   Squamous cell carcinoma of skin 03/14/2008   in situ-left upper outer forehead-medial (CX35FU)   Squamous cell carcinoma of skin 03/14/2008   in situ-crown of scalp (Cx35FU)   Squamous cell carcinoma of skin 04/15/2011   in situ-right dorsal forearm (txpbx)   Squamous cell carcinoma of skin 10/16/2011   in situ-left sideburn   Squamous cell carcinoma of skin 03/11/2015   ka-left sideburn (CX35FU)   Squamous cell carcinoma of skin 03/11/2015   ka-left forearm (CX35FU)   Squamous cell carcinoma of skin 06/22/2018   in situ-left forearm, sup (txpbx)   Squamous cell carcinoma of skin 05/01/2019   in situ-mid anterior scalp    Squamous cell carcinoma of skin 05/01/2019   in situ-right upper arm   Past Surgical History:   Procedure Laterality Date   ATRIAL FIBRILLATION ABLATION N/A 04/17/2020   Procedure: [place];  Surgeon: Vickie Epley, MD;  Location: [place] CV LAB;  Service: Cardiovascular;  Laterality: N/A;   AXILLARY SENTINEL NODE BIOPSY Right 08/23/2019   Procedure: SENTINEL LYMPH NODE BIOSPY RIGHT AXILLA;  Surgeon: Stark Klein, MD;  Location: [place];  Service: General;  Laterality: Right;   CARDIOVERSION N/A 05/10/2020   Procedure: CARDIOVERSION;  Surgeon: Freada Bergeron, MD;  Location: [facility] ENDOSCOPY;  Service: Cardiovascular;  Laterality: N/A;   CARDIOVERSION N/A 05/08/2021   Procedure: CARDIOVERSION;  Surgeon: Donato Heinz, MD;  Location: [facility] ENDOSCOPY;  Service: Cardiovascular;  Laterality: N/A;   COLONOSCOPY  2002   FASCIECTOMY Left 03/28/2019   Procedure: SEGMENTAL FASCIECTOMY LEFT RING FINGER;  Surgeon: Daryll Brod, MD;  Location: [place];  Service: Orthopedics;  Laterality: Left;  ANESTHESIA  AXILLARY BLOCK   INSERTION PROSTATE RADIATION SEED  8 4 2010   per Dr Rosana Hoes   MELANOMA EXCISION Right 06/22/2019   Procedure: WIDE LOCAL EXCISION RIGHT CHEST WALL MELANOMA, ADVANCEMENT FLAP CLOSURE FOR DEFECT 3X6 CM;  Surgeon: Stark Klein, MD;  Location: [facility];  Service: General;  Laterality: Right;  POLYPECTOMY  2002   THROAT SURGERY  1980   benign cyst   UPPER GASTROINTESTINAL ENDOSCOPY     URETHRAL STRICTURE DILATATION     also penile implant   Patient Active Problem List   Diagnosis Date Noted   Orthostatic hypotension 05/02/2021   Melanoma ([place])    Atrial fibrillation ([place])    Malignant neoplasm of prostate ([place]) 07/15/2015   Recurrent nephrolithiasis 07/15/2015   Personal history of prostate cancer 04/25/2015   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/09/2013   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   Coronary artery disease 11/02/2011   Urethral stricture 06/22/2011   Hyperlipidemia     Borderline diabetic    Diabetes mellitus with coincident hypertension ([place])     ONSET DATE: 08/06/2021   REFERRING DIAG: Diagnosis M16.11 (ICD-10-CM) - Primary osteoarthritis of right hip R26.81 (ICD-10-CM) - Unsteadiness on feet R26.89 (ICD-10-CM) - Balance problem M54.50 (ICD-10-CM) - Low back pain at multiple sites   THERAPY DIAG:  Unsteadiness on feet  Other abnormalities of gait and mobility  Muscle weakness (generalized)  SUBJECTIVE:   SUBJECTIVE :  Tried the exercises, reports the heel to toe is the most challenging.                                                                                                                                                                        PERTINENT HISTORY: Ataxia, Arthritis, Back Pain, DM Type 2, GERD, HLD, HTN, Melanoma, Prostate CA, Squamous cell carcinoma of skin  PAIN:  Are you having pain? No VAS scale: 0/10  PRECAUTIONS: Fall  WEIGHT BEARING RESTRICTIONS No  LIVING ENVIRONMENT: Lives with: lives alone Lives in: House/apartment Stairs: Yes; Internal: 2 steps; on right going up and External: 3 steps; on right going up (reports primarily comes in through garage, no rails) Has following equipment at home: Quad cane small base  PLOF: Independent  PATIENT GOALS: To improve my balance and prevent further falls; Get back to walking at the [employer] (2-3 miles, 3x/week)   OBJECTIVE:  PT Treatment:  NMR:   On air ex with EC:  -with slight space between feet static stance 3 x 30 seconds, intermittent taps to chair for balance. -wide BOS head turns 2 x 10 reps, head nods 2 x 10 reps   On level ground: -alternating SLS taps to cones, x12 reps each leg, needing intermittent UE support, incr difficulty with RLE   -slow marching next to countertop with holds for 3 seconds, with fingertip support, incr difficulty with standing on LLE. Down and back x3 reps. Added to HEP as pt asking about how he can work on his balance on one leg  at home.    -stepping over 4 smaller orange obstacles; down  and back x2 reps with step to pattern with each leg UE support > none, then performing with alternating pattern down and back x3 reps with UE support     09/17/21 1655  Balance Exercises: Standing  Stepping Strategy Anterior;Posterior;Foam/compliant surface;Limitations;10 reps  Stepping Strategy Limitations on air ex, x10 reps alternating each direction, intermittent UE support  Rockerboard Anterior/posterior;EO;Limitations;Intermittent UE support  Rockerboard Limitations Weight shifting x15 reps ,incr difficulty shifting weight onto toes     Access Code: [email or web address] URL: [email or web address] Date: 09/17/2021 Prepared by: Janann August  Exercises Sit to Stand with Arms Crossed - 1 x daily - 5 x weekly - 2 sets - 10 reps Romberg Stance on Foam Pad with Head Rotation - 1 x daily - 5 x weekly - 2 sets - 10 reps Romberg Stance with Head Nods on Foam Pad - 1 x daily - 5 x weekly - 2 sets - 10 reps Standing Balance with Eyes Closed on Pillow - 1 x daily - 5 x weekly - 1 sets - 3 reps - 30 seconds hold Tandem Stance - 1 x daily - 5 x weekly - 1 sets - 3 reps - 20-25 seconds hold Tandem Walking with Counter Support - 1 x daily - 5 x weekly - 1 sets - 3-4 reps  New addition on 09/17/21: Standing Marching - 1-2 x daily - 5 x weekly - 3 sets   PATIENT EDUCATION: Education details: standing marching addition to HEP.   Person educated: Patient and Caregiver Education method: Explanation Education comprehension: verbalized understanding   HOME EXERCISE PROGRAM: Access Code: [email or web address]  ASSESSMENT:  CLINICAL IMPRESSION: Today's skilled session focused on standing balance strategies on compliant surfaces and SLS tasks. Pt challenged by SLS tasks even on level ground, esp on LLE. Pt needing intermittent UE support as needed for balance. Added slow marching at countertop to pt's HEP. Pt tolerated session well, will continue  to progress towards LTGs.    REHAB POTENTIAL: Good  CLINICAL DECISION MAKING: Evolving/moderate complexity  EVALUATION COMPLEXITY: Moderate   GOALS: Goals reviewed with patient? Yes  UPDATED SHORT TERM GOALS:  STG Name Target Date Goal status  1 Patient will demonstrate independence with initial balance HEP Baseline: no HEP established 10/03/2021 INITIAL  2 Patient will improve 5x sit <> stand to </= 15 seconds to demo improved balance Baseline: 17.25 secs 10/03/2021 INITIAL    UPDATED LONG TERM GOALS:   LTG Name Target Date Goal status  1 Pt will improve FGA to >/= 23 to demonstrate improved balance and reduced fall risk Baseline: 19/20 10/31/2021 INITIAL  2 Pt will improve 5x STS to </= 12 seconds to demo improved balance Baseline: 17.25 secs with BUE support 10/31/2021 INITIAL  3 Patient will verbalize understanding of fall prevention to improve safety within the home and reduce fall risk Baseline: 6 falls in 5 months 10/31/2021 INITIAL  4 Patient will improve situation 4 of M-CTSIB to >/= 25 seconds Baseline: 20 seconds 10/31/2021 INITIAL  5 Patient will improve TUG to </= 10 seconds for decreased fall risk Baseline: 11.63 seconds 10/31/2021 INITIAL     PLAN: PT FREQUENCY: 2x/week  PT DURATION: other: 3 weeks, then 1x/week for 3 weeks  PLANNED INTERVENTIONS: Therapeutic exercises, Therapeutic activity, Neuro Muscular re-education, Balance training, Gait training, Patient/Family education, Joint mobilization, Stair training, Vestibular training, Canalith repositioning, and DME instructions  PLAN FOR NEXT SESSION: Continue balance on complaint surfaces; high level balance activities; SLS/obstacle negotiation. Vestibular input for balance.    Moe Brier N  Pattijo Juste PT, DPT 09/17/2021, 4:20 PM  [facility] [address] [place], [place], [address] Phone: [phone]   Fax:  [phone]

## 2021-09-24 ENCOUNTER — Telehealth: Payer: Self-pay | Admitting: Pharmacist

## 2021-09-24 ENCOUNTER — Ambulatory Visit: Payer: Medicare Other

## 2021-09-24 NOTE — Chronic Care Management (AMB) (Signed)
Chronic Care Management Pharmacy Assistant   Name: Antonio Clark  MRN: [id number] DOB: 1935-08-19   Reason for Encounter: Disease State - Hypertension Call     Recent office visits:    Recent consult visits:  08/22/21 Elisabeth Pigeon - Urology - Urethral stricture - Start finasteride. D/C Flomax. Follow up in 6 months.   06/26/21 Wayne Both ,MD - Dermatology - Squamous cell carcinoma - skin biopsy and lesion extraction done. Follow up as scheduled.    06/19/21 Lenoria Chime - Urology - No notes available.   Hospital visits: 07/03/21 Medication Reconciliation was completed by comparing discharge summary, patients EMR and Pharmacy list, and upon discussion with patient.   Admitted to the hospital on 07/03/21 due to Fall. Discharge date was 07/03/21. Discharged from [facility]?Medications Started at [facility] Discharge:?? HYDROcodone-acetaminophen ([place]) 5-325 MG tablet   Medication Changes at Hospital Discharge: None noted   Medications Discontinued at Hospital Discharge: None noted   Medications that remain the same after Hospital Discharge:??  All other medications will remain the same   Medications: Outpatient Encounter Medications as of 09/24/2021  Medication Sig   atorvastatin (LIPITOR) 40 MG tablet TAKE 1 TABLET (40 MG TOTAL) BY MOUTH DAILY. PLEASE STOP PRAVASTATIN   cetirizine (ZYRTEC) 10 MG tablet Take 10 mg by mouth daily as needed for allergies.   escitalopram (LEXAPRO) 10 MG tablet TAKE 1 TABLET BY MOUTH EVERY DAY   fluticasone (FLONASE) 50 MCG/ACT nasal spray Place 1 spray into both nostrils daily as needed for allergies or rhinitis.   linagliptin (TRADJENTA) 5 MG TABS tablet Take 1 tablet (5 mg total) by mouth daily.   LORazepam (ATIVAN) 0.5 MG tablet TAKE 1 TABLET BY MOUTH EVERY DAY AS NEEDED FOR ANXIETY   metoprolol tartrate (LOPRESSOR) 25 MG tablet Take 1 tablet (25 mg total) by mouth 2 (two) times daily.   pantoprazole  (PROTONIX) 40 MG tablet TAKE 1 TABLET BY MOUTH EVERY DAY   PRADAXA 150 MG CAPS capsule TAKE 1 CAPSULE BY MOUTH TWICE A DAY   sucralfate (CARAFATE) 1 g tablet TAKE 1 TABLET BY MOUTH TWICE A DAY   No facility-administered encounter medications on file as of 09/24/2021.    Current antihypertensive regimen:  Metoprolol 25 mg 1 tablets by mouth 2 (two) times daily Finasteride 5 mg 1 tablet daily  How often are you checking your Blood Pressure?  Patient reported  he has not been checking blood pressure lately.   Current home BP readings: No readings to report at this time.   What recent interventions/DTPs have been made by any provider to improve Blood Pressure control since last CPP Visit:  Patient has had no medication changes since last assessment call.    Any recent hospitalizations or ED visits since last visit with CPP?  Patient had ED visit on 07/03/21 for Fall.    What diet changes have been made to improve Blood Pressure Control?  Patient is working on limiting his salt intake in his daily diet.   What exercise is being done to improve your Blood Pressure Control?  Patient is in PT but is otherwise not on any regular exercise regimen.    Adherence Review: Is the patient currently on ACE/ARB medication? Yes Does the patient have >5 day gap between last estimated fill dates? No   Star Rating Drugs: Atorvastatin 40 mg - last filled 09/04/20 90 days  Januvia 100 mg - last filled unavailable Valsartan - last filled  08/01/21 90 days  Tradjenta 5 mg - last filled 08/03/21 30 days    Future Appointments  Date Time Provider [place]  09/25/2021 11:45 AM Arliss Journey, PT [facility]  09/29/2021  1:15 PM Arliss Journey, PT [facility]  10/01/2021  2:45 PM Jones Bales, PT [facility]  10/06/2021  2:45 PM Jones Bales, PT [facility]  10/08/2021  2:45 PM Arliss Journey, PT [facility]  10/13/2021  1:15 PM Jones Bales, PT [facility]  [facility]  10/20/2021  2:00 PM Jones Bales, PT [facility]  10/27/2021  2:00 PM Jones Bales, PT [facility]  11/11/2021  1:20 PM Jerline Pain, MD [facility]    Jobe Gibbon, [facility] Clinical Pharmacist Assistant  [phone]

## 2021-09-25 ENCOUNTER — Other Ambulatory Visit: Payer: Self-pay

## 2021-09-25 ENCOUNTER — Ambulatory Visit: Payer: Medicare Other | Admitting: Physical Therapy

## 2021-09-25 DIAGNOSIS — R2681 Unsteadiness on feet: Secondary | ICD-10-CM

## 2021-09-25 DIAGNOSIS — M6281 Muscle weakness (generalized): Secondary | ICD-10-CM

## 2021-09-25 DIAGNOSIS — R2689 Other abnormalities of gait and mobility: Secondary | ICD-10-CM

## 2021-09-25 NOTE — Therapy (Signed)
OUTPATIENT PHYSICAL THERAPY TREATMENT   Patient Name: Antonio Clark MRN: [id number] DOB:20-Jan-1936, 86 y.o., male Today's Date: 09/25/2021  PCP: Susy Frizzle, MD REFERRING PROVIDER: Susy Frizzle, MD    PT End of Session - 09/25/21 1159     Visit Number 6    Number of Visits 12    Date for PT Re-Evaluation 10/31/21   extended due to availabilty with scheduling   Authorization Type [employer] [facility]    Progress Note Due on Visit 10    PT Start Time 1159   pt late to session   PT Stop Time 1230    PT Time Calculation (min) 31 min    Activity Tolerance Patient tolerated treatment well    Behavior During Therapy [facility] for tasks assessed/performed                 Past Medical History:  Diagnosis Date   Anal fissure    Arthritis    Atrial fibrillation ([place])    Back pain    Colon polyps    Diabetes mellitus type 2 with complications ([place])    Dysrhythmia    a-fib   GERD (gastroesophageal reflux disease)    History of echocardiogram 07/ 07/ 2011   History of lithotripsy 1989   Hyperlipidemia    Hypertension    Kidney stones    Melanoma ([place]) 05/01/2019   right chest wall (12/20)   Prostate CA ([place]) 2010   Sleep apnea    uses CPAP nightly   Squamous cell carcinoma of skin 05/23/1992   bowens-left parietal scalp (CX35FU)   Squamous cell carcinoma of skin 03/14/2008   in situ-left upper outer forehead-medial (CX35FU)   Squamous cell carcinoma of skin 03/14/2008   in situ-crown of scalp (Cx35FU)   Squamous cell carcinoma of skin 04/15/2011   in situ-right dorsal forearm (txpbx)   Squamous cell carcinoma of skin 10/16/2011   in situ-left sideburn   Squamous cell carcinoma of skin 03/11/2015   ka-left sideburn (CX35FU)   Squamous cell carcinoma of skin 03/11/2015   ka-left forearm (CX35FU)   Squamous cell carcinoma of skin 06/22/2018   in situ-left forearm, sup (txpbx)   Squamous cell carcinoma of skin 05/01/2019   in situ-mid anterior scalp    Squamous cell  carcinoma of skin 05/01/2019   in situ-right upper arm   Past Surgical History:  Procedure Laterality Date   ATRIAL FIBRILLATION ABLATION N/A 04/17/2020   Procedure: [place];  Surgeon: Vickie Epley, MD;  Location: [place] CV LAB;  Service: Cardiovascular;  Laterality: N/A;   AXILLARY SENTINEL NODE BIOPSY Right 08/23/2019   Procedure: SENTINEL LYMPH NODE BIOSPY RIGHT AXILLA;  Surgeon: Stark Klein, MD;  Location: [place];  Service: General;  Laterality: Right;   CARDIOVERSION N/A 05/10/2020   Procedure: CARDIOVERSION;  Surgeon: Freada Bergeron, MD;  Location: [facility] ENDOSCOPY;  Service: Cardiovascular;  Laterality: N/A;   CARDIOVERSION N/A 05/08/2021   Procedure: CARDIOVERSION;  Surgeon: Donato Heinz, MD;  Location: [facility] ENDOSCOPY;  Service: Cardiovascular;  Laterality: N/A;   COLONOSCOPY  2002   FASCIECTOMY Left 03/28/2019   Procedure: SEGMENTAL FASCIECTOMY LEFT RING FINGER;  Surgeon: Daryll Brod, MD;  Location: [place];  Service: Orthopedics;  Laterality: Left;  ANESTHESIA  AXILLARY BLOCK   INSERTION PROSTATE RADIATION SEED  8 4 2010   per Dr Rosana Hoes   MELANOMA EXCISION Right 06/22/2019   Procedure: WIDE LOCAL EXCISION RIGHT CHEST WALL MELANOMA, ADVANCEMENT FLAP  CLOSURE FOR DEFECT 3X6 CM;  Surgeon: Stark Klein, MD;  Location: [place];  Service: General;  Laterality: Right;   POLYPECTOMY  2002   THROAT SURGERY  1980   benign cyst   UPPER GASTROINTESTINAL ENDOSCOPY     URETHRAL STRICTURE DILATATION     also penile implant   Patient Active Problem List   Diagnosis Date Noted   Orthostatic hypotension 05/02/2021   Melanoma ([place])    Atrial fibrillation ([place])    Malignant neoplasm of prostate ([place]) 07/15/2015   Recurrent nephrolithiasis 07/15/2015   Personal history of prostate cancer 04/25/2015   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/09/2013   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 Thinks he slept on it wrong. Got the foam pad and has been working with it at home. All the other exercises are going well at home.                                                                                                                                                                        PERTINENT HISTORY: Ataxia, Arthritis, Back Pain, DM Type 2, GERD, HLD, HTN, Melanoma, Prostate CA, Squamous cell carcinoma of skin  PAIN:  Are you having pain? Yes VAS scale: 4-5/10, in R hip.   PRECAUTIONS: Fall  WEIGHT BEARING RESTRICTIONS No  LIVING ENVIRONMENT: Lives with: lives alone Lives in: House/apartment Stairs: Yes; Internal: 2 steps; on right going up and External: 3 steps; on right going up (reports primarily comes in through garage, no rails) Has following equipment at home: Quad cane small base  PLOF: Independent  PATIENT GOALS: To improve my balance and prevent further falls; Get back to walking at the [facility] (2-3 miles, 3x/week)   OBJECTIVE:  PT Treatment:  NMR:     09/25/21 1202  Balance Exercises: Standing  Standing Eyes Opened Foam/compliant surface;Limitations  Standing Eyes Opened Limitations Feet slightly more narrow head turns x10 reps, pt reporting incr pain/discomfort in hip and needing to sit down.  Standing Eyes Closed Foam/compliant surface;Limitations  Standing Eyes Closed  Limitations Feet hip width between feet 3 x 30 seconds, incr postural sway.  SLS Solid  surface;Limitations  SLS Limitations Alternating forward/cross body tap to colorful floor bubbles x12 reps each side, needing intermittent UE support for balance.   SLS with Vectors Foam/compliant surface;Limitations  SLS with Vectors Limitations On air ex at bottom of staircase on air ex- alternating SLS taps to 6" step, x12 reps each leg, intermittent UE support with min guard.   Rockerboard Lateral;EO;Limitations  Rockerboard Limitations Weight shifting x12 reps, then performed x5 reps head turns while trying to keep board steady. Pt reporting incr hip discomfort on board.  Sit to Stand Foam/compliant surface;Limitations (on air ex x10 reps no UE support)  Other Standing Exercises On air ex; with keeping feet on surface weight shifting onto toes and then back onto heels x15 reps w/ intermittent UE support. Cued for posture      PATIENT EDUCATION: Education details:  None given today.   Person educated: Patient and Caregiver Education method: Explanation Education comprehension: verbalized understanding   HOME EXERCISE PROGRAM: Access Code: [email or web address]  ASSESSMENT:  CLINICAL IMPRESSION: Pt arriving late to session today. Pt slightly limited with ability to participate today (esp with more static balance) due to pt having some hip discomfort - needing some intermittent seated rest breaks which made it feel better. Continued to work on balance on compliant surfaces and SLS. Pt challenged by SLS even on level ground. Will continue to progress towards LTGs.   REHAB POTENTIAL: Good  CLINICAL DECISION MAKING: Evolving/moderate complexity  EVALUATION COMPLEXITY: Moderate   GOALS: Goals reviewed with patient? Yes  UPDATED SHORT TERM GOALS:  STG Name Target Date Goal status  1 Patient will demonstrate independence with initial balance HEP Baseline: no HEP established 10/03/2021 INITIAL  2 Patient  will improve 5x sit <> stand to </= 15 seconds to demo improved balance Baseline: 17.25 secs 10/03/2021 INITIAL    UPDATED LONG TERM GOALS:   LTG Name Target Date Goal status  1 Pt will improve FGA to >/= 23 to demonstrate improved balance and reduced fall risk Baseline: 19/20 10/31/2021 INITIAL  2 Pt will improve 5x STS to </= 12 seconds to demo improved balance Baseline: 17.25 secs with BUE support 10/31/2021 INITIAL  3 Patient will verbalize understanding of fall prevention to improve safety within the home and reduce fall risk Baseline: 6 falls in 5 months 10/31/2021 INITIAL  4 Patient will improve situation 4 of M-CTSIB to >/= 25 seconds Baseline: 20 seconds 10/31/2021 INITIAL  5 Patient will improve TUG to </= 10 seconds for decreased fall risk Baseline: 11.63 seconds 10/31/2021 INITIAL     PLAN: PT FREQUENCY: 2x/week  PT DURATION: other: 3 weeks, then 1x/week for 3 weeks  PLANNED INTERVENTIONS: Therapeutic exercises, Therapeutic activity, Neuro Muscular re-education, Balance training, Gait training, Patient/Family education, Joint mobilization, Stair training, Vestibular training, Canalith repositioning, and DME instructions  PLAN FOR NEXT SESSION: How is pt's hip feeling today? Continue balance on complaint surfaces; high level balance activities; SLS/obstacle negotiation. Vestibular input for balance.    Arliss Journey PT, DPT 09/25/2021, 12:36 PM  [place] [address] [place], [place], [address] Phone: [phone]   Fax:  [phone]

## 2021-09-29 ENCOUNTER — Ambulatory Visit: Payer: Medicare Other | Admitting: Physical Therapy

## 2021-09-29 ENCOUNTER — Other Ambulatory Visit: Payer: Self-pay

## 2021-09-29 DIAGNOSIS — R2689 Other abnormalities of gait and mobility: Secondary | ICD-10-CM

## 2021-09-29 DIAGNOSIS — R2681 Unsteadiness on feet: Secondary | ICD-10-CM

## 2021-09-29 DIAGNOSIS — M6281 Muscle weakness (generalized): Secondary | ICD-10-CM

## 2021-09-29 NOTE — Therapy (Signed)
OUTPATIENT PHYSICAL THERAPY TREATMENT   Patient Name: Antonio Clark MRN: [id number] DOB:September 12, 1935, 86 y.o., male Today's Date: 09/29/2021  PCP: Susy Frizzle, MD REFERRING PROVIDER: Susy Frizzle, MD    PT End of Session - 09/29/21 1408     Visit Number 7    Number of Visits 12    Date for PT Re-Evaluation 10/31/21    Authorization Type UHC MCR    Progress Note Due on Visit 10    PT Start Time 1320    PT Stop Time 1404    PT Time Calculation (min) 44 min    Equipment Utilized During Treatment Gait belt    Activity Tolerance Patient tolerated treatment well    Behavior During Therapy WFL for tasks assessed/performed                  Past Medical History:  Diagnosis Date   Anal fissure    Arthritis    Atrial fibrillation ([place])    Back pain    Colon polyps    Diabetes mellitus type 2 with complications ([place])    Dysrhythmia    a-fib   GERD (gastroesophageal reflux disease)    History of echocardiogram 07/ 07/ 2011   History of lithotripsy 1989   Hyperlipidemia    Hypertension    Kidney stones    Melanoma ([place]) 05/01/2019   right chest wall (12/20)   Prostate CA ([place]) 2010   Sleep apnea    uses CPAP nightly   Squamous cell carcinoma of skin 05/23/1992   bowens-left parietal scalp (CX35FU)   Squamous cell carcinoma of skin 03/14/2008   in situ-left upper outer forehead-medial (CX35FU)   Squamous cell carcinoma of skin 03/14/2008   in situ-crown of scalp (Cx35FU)   Squamous cell carcinoma of skin 04/15/2011   in situ-right dorsal forearm (txpbx)   Squamous cell carcinoma of skin 10/16/2011   in situ-left sideburn   Squamous cell carcinoma of skin 03/11/2015   ka-left sideburn (CX35FU)   Squamous cell carcinoma of skin 03/11/2015   ka-left forearm (CX35FU)   Squamous cell carcinoma of skin 06/22/2018   in situ-left forearm, sup (txpbx)   Squamous cell carcinoma of skin 05/01/2019   in situ-mid anterior scalp    Squamous cell carcinoma of  skin 05/01/2019   in situ-right upper arm   Past Surgical History:  Procedure Laterality Date   ATRIAL FIBRILLATION ABLATION N/A 04/17/2020   Procedure: [place];  Surgeon: Vickie Epley, MD;  Location: [facility];  Service: Cardiovascular;  Laterality: N/A;   AXILLARY SENTINEL NODE BIOPSY Right 08/23/2019   Procedure: SENTINEL LYMPH NODE BIOSPY RIGHT AXILLA;  Surgeon: Stark Klein, MD;  Location: [facility];  Service: General;  Laterality: Right;   CARDIOVERSION N/A 05/10/2020   Procedure: CARDIOVERSION;  Surgeon: Freada Bergeron, MD;  Location: [facility] ENDOSCOPY;  Service: Cardiovascular;  Laterality: N/A;   CARDIOVERSION N/A 05/08/2021   Procedure: CARDIOVERSION;  Surgeon: Donato Heinz, MD;  Location: [facility] ENDOSCOPY;  Service: Cardiovascular;  Laterality: N/A;   COLONOSCOPY  2002   FASCIECTOMY Left 03/28/2019   Procedure: SEGMENTAL FASCIECTOMY LEFT RING FINGER;  Surgeon: Daryll Brod, MD;  Location: [facility];  Service: Orthopedics;  Laterality: Left;  ANESTHESIA  AXILLARY BLOCK   INSERTION PROSTATE RADIATION SEED  8 4 2010   per Dr Rosana Hoes   MELANOMA EXCISION Right 06/22/2019   Procedure: WIDE LOCAL EXCISION RIGHT CHEST WALL MELANOMA, ADVANCEMENT FLAP CLOSURE FOR  DEFECT 3X6 CM;  Surgeon: Stark Klein, MD;  Location: [facility];  Service: General;  Laterality: Right;   POLYPECTOMY  2002   THROAT SURGERY  1980   benign cyst   UPPER GASTROINTESTINAL ENDOSCOPY     URETHRAL STRICTURE DILATATION     also penile implant   Patient Active Problem List   Diagnosis Date Noted   Orthostatic hypotension 05/02/2021   Melanoma ([place])    Atrial fibrillation ([place])    Malignant neoplasm of prostate ([place]) 07/15/2015   Recurrent nephrolithiasis 07/15/2015   Personal history of prostate cancer 04/25/2015   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/09/2013   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   Coronary artery disease  11/02/2011   Urethral stricture 06/22/2011   Hyperlipidemia    Borderline diabetic    Diabetes mellitus with coincident hypertension ([place])     ONSET DATE: 08/06/2021   REFERRING DIAG: Diagnosis M16.11 (ICD-10-CM) - Primary osteoarthritis of right hip R26.81 (ICD-10-CM) - Unsteadiness on feet R26.89 (ICD-10-CM) - Balance problem M54.50 (ICD-10-CM) - Low back pain at multiple sites   THERAPY DIAG:  Unsteadiness on feet  Muscle weakness (generalized)  Other abnormalities of gait and mobility  SUBJECTIVE:   SUBJECTIVE :  Reports his vision has been "off", verbalized needing cataract surgery in March. No new falls. Reports pain in R hip.                                                                                                                                                                        PERTINENT HISTORY: Ataxia, Arthritis, Back Pain, DM Type 2, GERD, HLD, HTN, Melanoma, Prostate CA, Squamous cell carcinoma of skin  PAIN:  Are you having pain? Yes VAS scale: 4-5/10, in R hip.   PRECAUTIONS: Fall  WEIGHT BEARING RESTRICTIONS No  LIVING ENVIRONMENT: Lives with: lives alone Lives in: House/apartment Stairs: Yes; Internal: 2 steps; on right going up and External: 3 steps; on right going up (reports primarily comes in through garage, no rails) Has following equipment at home: Quad cane small base  PLOF: Independent  PATIENT GOALS: To improve my balance and prevent further falls; Get back to walking at the [facility] (2-3 miles, 3x/week)   OBJECTIVE:  PT Treatment:  NMR:   Blocked sit <>stand practice for anterior weight shifting and immediate balance: -5 reps standing on air ex w/CGA, noted posterior sway -Progressed to 5 reps w/B feet elevated on wedge to bias posterior lean, CGA-min A for posterior lean correction   Forward walking over blue mat w/various obstacles underneath for improved dynamic stability on uneven terrain, x2 each direction, min A throughout for  posterior LOB correction and min verbal cues to maintain hip-width BOS   Standing dynamic balance  on 2 blue dynadiscs in // bars, EO w/ intermittent BUE support. 3x30s w/CGA. Progressed to [facility] but pt unable to hold >2s and required min-mod A for posterior LOB correction   Walking ball toss, 115' fwd and 30' retro for dual tasking and BLE coordination, CGA-Min A throughout. Noted pt stopped gait w/each ball toss while retro walking, min verbal cues for continuation of gait.      PATIENT EDUCATION: Education details:  Encouraged pt to  use QC at home on days when he feels "off" for improved stability with gait   Person educated: Patient and Caregiver Education method: Explanation Education comprehension: verbalized understanding   HOME EXERCISE PROGRAM: Access Code: [email or web address]  ASSESSMENT:  CLINICAL IMPRESSION: Emphasis of session on immediate standing balance, dual-tasking and anterior weight shifting. Pt with some hip discomfort throughout session but mainly limited by decreased cardiovascular endurance. Noted significant posterolateral lean to R during gait and pt reported feeling "wobbly" today. Encouraged pt to use QC when feeling "wobbly" for added stability. Pt will continue to benefit from skilled PT to improve dynamic balance, anterior weight shifting and progression towards LTGs.    REHAB POTENTIAL: Good  CLINICAL DECISION MAKING: Evolving/moderate complexity  EVALUATION COMPLEXITY: Moderate   GOALS: Goals reviewed with patient? Yes  UPDATED SHORT TERM GOALS:  STG Name Target Date Goal status  1 Patient will demonstrate independence with initial balance HEP Baseline: no HEP established 10/03/2021 INITIAL  2 Patient will improve 5x sit <> stand to </= 15 seconds to demo improved balance Baseline: 17.25 secs 10/03/2021 INITIAL    UPDATED LONG TERM GOALS:   LTG Name Target Date Goal status  1 Pt will improve FGA to >/= 23 to demonstrate improved balance and reduced fall  risk Baseline: 19/20 10/31/2021 INITIAL  2 Pt will improve 5x STS to </= 12 seconds to demo improved balance Baseline: 17.25 secs with BUE support 10/31/2021 INITIAL  3 Patient will verbalize understanding of fall prevention to improve safety within the home and reduce fall risk Baseline: 6 falls in 5 months 10/31/2021 INITIAL  4 Patient will improve situation 4 of M-CTSIB to >/= 25 seconds Baseline: 20 seconds 10/31/2021 INITIAL  5 Patient will improve TUG to </= 10 seconds for decreased fall risk Baseline: 11.63 seconds 10/31/2021 INITIAL     PLAN: PT FREQUENCY: 2x/week  PT DURATION: other: 3 weeks, then 1x/week for 3 weeks  PLANNED INTERVENTIONS: Therapeutic exercises, Therapeutic activity, Neuro Muscular re-education, Balance training, Gait training, Patient/Family education, Joint mobilization, Stair training, Vestibular training, Canalith repositioning, and DME instructions  PLAN FOR NEXT SESSION: How is pt's hip feeling today? Continue balance on complaint surfaces; high level balance activities; SLS/obstacle negotiation. Vestibular input for balance.    Cruzita Lederer Dolph Tavano PT, DPT 09/29/2021, 2:10 PM  [facility] [address] [place], [place], [address] Phone: [phone]   Fax:  [phone]

## 2021-10-01 ENCOUNTER — Ambulatory Visit: Payer: Medicare Other

## 2021-10-01 ENCOUNTER — Other Ambulatory Visit: Payer: Self-pay

## 2021-10-01 DIAGNOSIS — R2681 Unsteadiness on feet: Secondary | ICD-10-CM | POA: Diagnosis not present

## 2021-10-01 DIAGNOSIS — M6281 Muscle weakness (generalized): Secondary | ICD-10-CM

## 2021-10-01 DIAGNOSIS — R2689 Other abnormalities of gait and mobility: Secondary | ICD-10-CM

## 2021-10-01 NOTE — Therapy (Signed)
OUTPATIENT PHYSICAL THERAPY TREATMENT   Patient Name: Antonio Clark MRN: [id number] DOB:1936-05-25, 86 y.o., male Today's Date: 10/01/2021  PCP: Susy Frizzle, MD REFERRING PROVIDER: Susy Frizzle, MD    PT End of Session - 10/01/21 1445     Visit Number 8    Number of Visits 12    Date for PT Re-Evaluation 10/31/21    Authorization Type UHC MCR    Progress Note Due on Visit 10    PT Start Time 3832    PT Stop Time 1527    PT Time Calculation (min) 42 min    Equipment Utilized During Treatment Gait belt    Activity Tolerance Patient tolerated treatment well    Behavior During Therapy WFL for tasks assessed/performed                   Past Medical History:  Diagnosis Date   Anal fissure    Arthritis    Atrial fibrillation ([place])    Back pain    Colon polyps    Diabetes mellitus type 2 with complications ([place])    Dysrhythmia    a-fib   GERD (gastroesophageal reflux disease)    History of echocardiogram 07/ 07/ 2011   History of lithotripsy 1989   Hyperlipidemia    Hypertension    Kidney stones    Melanoma ([place]) 05/01/2019   right chest wall (12/20)   Prostate CA ([place]) 2010   Sleep apnea    uses CPAP nightly   Squamous cell carcinoma of skin 05/23/1992   bowens-left parietal scalp (CX35FU)   Squamous cell carcinoma of skin 03/14/2008   in situ-left upper outer forehead-medial (CX35FU)   Squamous cell carcinoma of skin 03/14/2008   in situ-crown of scalp (Cx35FU)   Squamous cell carcinoma of skin 04/15/2011   in situ-right dorsal forearm (txpbx)   Squamous cell carcinoma of skin 10/16/2011   in situ-left sideburn   Squamous cell carcinoma of skin 03/11/2015   ka-left sideburn (CX35FU)   Squamous cell carcinoma of skin 03/11/2015   ka-left forearm (CX35FU)   Squamous cell carcinoma of skin 06/22/2018   in situ-left forearm, sup (txpbx)   Squamous cell carcinoma of skin 05/01/2019   in situ-mid anterior scalp    Squamous cell carcinoma of  skin 05/01/2019   in situ-right upper arm   Past Surgical History:  Procedure Laterality Date   ATRIAL FIBRILLATION ABLATION N/A 04/17/2020   Procedure: [place];  Surgeon: Vickie Epley, MD;  Location: [facility];  Service: Cardiovascular;  Laterality: N/A;   AXILLARY SENTINEL NODE BIOPSY Right 08/23/2019   Procedure: SENTINEL LYMPH NODE BIOSPY RIGHT AXILLA;  Surgeon: Stark Klein, MD;  Location: [facility];  Service: General;  Laterality: Right;   CARDIOVERSION N/A 05/10/2020   Procedure: CARDIOVERSION;  Surgeon: Freada Bergeron, MD;  Location: [facility] ENDOSCOPY;  Service: Cardiovascular;  Laterality: N/A;   CARDIOVERSION N/A 05/08/2021   Procedure: CARDIOVERSION;  Surgeon: Donato Heinz, MD;  Location: [facility] ENDOSCOPY;  Service: Cardiovascular;  Laterality: N/A;   COLONOSCOPY  2002   FASCIECTOMY Left 03/28/2019   Procedure: SEGMENTAL FASCIECTOMY LEFT RING FINGER;  Surgeon: Daryll Brod, MD;  Location: [facility];  Service: Orthopedics;  Laterality: Left;  ANESTHESIA  AXILLARY BLOCK   INSERTION PROSTATE RADIATION SEED  8 4 2010   per Dr Rosana Hoes   MELANOMA EXCISION Right 06/22/2019   Procedure: WIDE LOCAL EXCISION RIGHT CHEST WALL MELANOMA, ADVANCEMENT FLAP CLOSURE  FOR DEFECT 3X6 CM;  Surgeon: Stark Klein, MD;  Location: [facility];  Service: General;  Laterality: Right;   POLYPECTOMY  2002   THROAT SURGERY  1980   benign cyst   UPPER GASTROINTESTINAL ENDOSCOPY     URETHRAL STRICTURE DILATATION     also penile implant   Patient Active Problem List   Diagnosis Date Noted   Orthostatic hypotension 05/02/2021   Melanoma ([place])    Atrial fibrillation ([place])    Malignant neoplasm of prostate ([place]) 07/15/2015   Recurrent nephrolithiasis 07/15/2015   Personal history of prostate cancer 04/25/2015   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/09/2013   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   Coronary artery disease  11/02/2011   Urethral stricture 06/22/2011   Hyperlipidemia    Borderline diabetic    Diabetes mellitus with coincident hypertension ([place])     ONSET DATE: 08/06/2021   REFERRING DIAG: Diagnosis M16.11 (ICD-10-CM) - Primary osteoarthritis of right hip R26.81 (ICD-10-CM) - Unsteadiness on feet R26.89 (ICD-10-CM) - Balance problem M54.50 (ICD-10-CM) - Low back pain at multiple sites   THERAPY DIAG:  Unsteadiness on feet  Muscle weakness (generalized)  Other abnormalities of gait and mobility  SUBJECTIVE:   SUBJECTIVE  " We're going to have to take it easy today, hip is bothering me". Due to pain feels like balance is a little more challenging. No other new changes/complaints or falls to report.                                                                                                                                           PERTINENT HISTORY: Ataxia, Arthritis, Back Pain, DM Type 2, GERD, HLD, HTN, Melanoma, Prostate CA, Squamous cell carcinoma of skin  PAIN:  Are you having pain? Yes VAS scale: 6/10, in R Hip, Ache  PRECAUTIONS: Fall  WEIGHT BEARING RESTRICTIONS No  LIVING ENVIRONMENT: Lives with: lives alone Lives in: House/apartment Stairs: Yes; Internal: 2 steps; on right going up and External: 3 steps; on right going up (reports primarily comes in through garage, no rails) Has following equipment at home: Quad cane small base  PLOF: Independent  PATIENT GOALS: To improve my balance and prevent further falls; Get back to walking at the [employer] (2-3 miles, 3x/week)   OBJECTIVE:  PT Treatment:  Completed ambulation outdoors on unlevel surfaces including paved and grass surfaces x 600 ft without AD, CGA intermittently on grass surfaces. Cues for clearance of feet as shuffled steps noted. Reports fatigue in the legs noted at end of ambulation.Seated rest break required after completion. Sp02: 99%, HR: 86. RPE rated at 5/10 after ambulation.   5x sit <> stand test:  15.91 seconds with UE at mat at standard chair height  Completed all of the following exercises during session as HEP review (new handout provided)  Access Code: [email or web address] URL: [email or web address] Date: 10/01/2021 Prepared by: Baldomero Lamy  Exercises Sit to Stand with Arms Crossed - 1 x daily - 5 x weekly - 2 sets - 10 reps Romberg Stance on Foam Pad with Head Rotation - 1 x daily - 5 x weekly - 2 sets - 10 reps Romberg Stance with Head Nods on Foam Pad - 1 x daily - 5 x weekly - 2 sets - 10 reps Standing Balance with Eyes Closed on Pillow - 1 x daily - 5 x weekly - 1 sets - 3 reps - 30 seconds hold Tandem Stance - 1 x daily - 5 x weekly - 1 sets - 3 reps - 20-25 seconds hold Tandem Walking with Counter Support - 1 x daily - 5 x weekly - 1 sets - 3-4 reps Standing Marching - 1-2 x daily - 5 x weekly - 3 sets Tandem Stance with Eyes Closed in Corner - 1 x daily - 5 x weekly - 1 sets - 3 reps - 20-25 hold - updated this session to eyes closed (partial tandem)   Alternating Step Taps: at the stairs standing on airex without UE support completed alternating toe taps to 1st step, 2 x 10 reps. Mild rest break required. CGA    PATIENT EDUCATION: Education details:  Progress toward STGs; Updated HEP (Tandem)  Person educated: Patient and Caregiver Education method: Explanation Education comprehension: verbalized understanding   HOME EXERCISE PROGRAM: Access Code: [email or web address]  ASSESSMENT:  CLINICAL IMPRESSION: Today's skilled PT session included gait outdoors on unlevel surfaces, mild imbalance noted on grass surfaces. Rest breaks required due to fatigue and discomfort in hip. Assessed patient's progress toward STG. Improved 5x sit <> stand to 15.91 secs. Reviewed and updated HEP to patient's progress. Will continue per POC   REHAB POTENTIAL: Good  CLINICAL DECISION MAKING: Evolving/moderate complexity  EVALUATION COMPLEXITY: Moderate   GOALS: Goals reviewed with  patient? Yes  UPDATED SHORT TERM GOALS:  STG Name Target Date Goal status  1 Patient will demonstrate independence with initial balance HEP Baseline: no HEP established; reports independence with HEP 10/03/2021 MET  2 Patient will improve 5x sit <> stand to </= 15 seconds to demo improved balance Baseline: 17.25 secs; 15.91 secs with UE support 10/03/2021 PARTIALLY MET    UPDATED LONG TERM GOALS:   LTG Name Target Date Goal status  1 Pt will improve FGA to >/= 23 to demonstrate improved balance and reduced fall risk Baseline: 19/20 10/31/2021 INITIAL  2 Pt will improve 5x STS to </= 12 seconds to demo improved balance Baseline: 17.25 secs with BUE support 10/31/2021 INITIAL  3 Patient will verbalize understanding of fall prevention to improve safety within the home and reduce fall risk Baseline: 6 falls in 5 months 10/31/2021 INITIAL  4 Patient will improve situation 4 of M-CTSIB to >/= 25 seconds Baseline: 20 seconds 10/31/2021 INITIAL  5 Patient will improve TUG to </= 10 seconds for decreased fall risk Baseline: 11.63 seconds 10/31/2021 INITIAL     PLAN: PT FREQUENCY: 2x/week  PT DURATION: other: 3 weeks, then 1x/week for 3 weeks  PLANNED INTERVENTIONS: Therapeutic exercises, Therapeutic activity, Neuro Muscular re-education, Balance training, Gait training, Patient/Family education, Joint mobilization, Stair training, Vestibular training, Canalith repositioning, and DME instructions  PLAN FOR NEXT SESSION: Continue balance on complaint surfaces; high level balance activities; SLS/obstacle negotiation. Vestibular input for balance.    [place], DPT 10/01/2021, 3:29 PM  [facility] [address] [place], [place], [address] Phone: [phone]   Fax:  [phone]

## 2021-10-05 NOTE — Therapy (Deleted)
OUTPATIENT PHYSICAL THERAPY TREATMENT   Patient Name: Antonio Clark MRN: [id number] DOB:1935/09/28, 86 y.o., male Today's Date: 10/05/2021  PCP: Susy Frizzle, MD REFERRING PROVIDER: Susy Frizzle, MD            Past Medical History:  Diagnosis Date   Anal fissure    Arthritis    Atrial fibrillation [facility]    Back pain    Colon polyps    Diabetes mellitus type 2 with complications ([place])    Dysrhythmia    a-fib   GERD (gastroesophageal reflux disease)    History of echocardiogram 07/ 07/ 2011   History of lithotripsy 1989   Hyperlipidemia    Hypertension    Kidney stones    Melanoma ([place]) 05/01/2019   right chest wall (12/20)   Prostate CA ([place]) 2010   Sleep apnea    uses CPAP nightly   Squamous cell carcinoma of skin 05/23/1992   bowens-left parietal scalp (CX35FU)   Squamous cell carcinoma of skin 03/14/2008   in situ-left upper outer forehead-medial (CX35FU)   Squamous cell carcinoma of skin 03/14/2008   in situ-crown of scalp (Cx35FU)   Squamous cell carcinoma of skin 04/15/2011   in situ-right dorsal forearm (txpbx)   Squamous cell carcinoma of skin 10/16/2011   in situ-left sideburn   Squamous cell carcinoma of skin 03/11/2015   ka-left sideburn (CX35FU)   Squamous cell carcinoma of skin 03/11/2015   ka-left forearm (CX35FU)   Squamous cell carcinoma of skin 06/22/2018   in situ-left forearm, sup (txpbx)   Squamous cell carcinoma of skin 05/01/2019   in situ-mid anterior scalp    Squamous cell carcinoma of skin 05/01/2019   in situ-right upper arm   Past Surgical History:  Procedure Laterality Date   ATRIAL FIBRILLATION ABLATION N/A 04/17/2020   Procedure: [place];  Surgeon: Vickie Epley, MD;  Location: [place] [facility];  Service: Cardiovascular;  Laterality: N/A;   AXILLARY SENTINEL NODE BIOPSY Right 08/23/2019   Procedure: SENTINEL LYMPH NODE BIOSPY RIGHT AXILLA;  Surgeon: Stark Klein, MD;  Location: [place];  Service: General;  Laterality: Right;   CARDIOVERSION N/A 05/10/2020   Procedure: CARDIOVERSION;  Surgeon: Freada Bergeron, MD;  Location: [facility];  Service: Cardiovascular;  Laterality: N/A;   CARDIOVERSION N/A 05/08/2021   Procedure: CARDIOVERSION;  Surgeon: Donato Heinz, MD;  Location: [facility] ENDOSCOPY;  Service: Cardiovascular;  Laterality: N/A;   COLONOSCOPY  2002   FASCIECTOMY Left 03/28/2019   Procedure: SEGMENTAL FASCIECTOMY LEFT RING FINGER;  Surgeon: Daryll Brod, MD;  Location: [place];  Service: Orthopedics;  Laterality: Left;  ANESTHESIA  AXILLARY BLOCK   INSERTION PROSTATE RADIATION SEED  8 4 2010   per Dr Rosana Hoes   MELANOMA EXCISION Right 06/22/2019   Procedure: WIDE LOCAL EXCISION RIGHT CHEST WALL MELANOMA, ADVANCEMENT FLAP CLOSURE FOR DEFECT 3X6 CM;  Surgeon: Stark Klein, MD;  Location: [place];  Service: General;  Laterality: Right;   POLYPECTOMY  2002   THROAT SURGERY  1980   benign cyst   UPPER GASTROINTESTINAL ENDOSCOPY     URETHRAL STRICTURE DILATATION     also penile implant   Patient Active Problem List   Diagnosis Date Noted   Orthostatic hypotension 05/02/2021   Melanoma ([place])    Atrial fibrillation ([place])    Malignant neoplasm of prostate ([place]) 07/15/2015   Recurrent nephrolithiasis 07/15/2015   Personal history of prostate cancer 04/25/2015   Male  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/09/2013   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  SUBJECTIVE:    SUBJECTIVE  ***                                                                                                                           PERTINENT HISTORY: Ataxia, Arthritis, Back Pain, DM Type 2, GERD, HLD, HTN, Melanoma, Prostate CA, Squamous cell carcinoma of skin  PAIN:  Are you having pain? ***  VAS scale: 6/10, in R Hip, Ache  PRECAUTIONS: Fall  WEIGHT BEARING RESTRICTIONS No  LIVING ENVIRONMENT: Lives with: lives alone Lives in: House/apartment Stairs: Yes; Internal: 2 steps; on right going up and External: 3 steps; on right going up (reports primarily comes in through garage, no rails) Has following equipment at home: Quad cane small base  PLOF: Independent  PATIENT GOALS: To improve my balance and prevent further falls; Get back to walking [facility] the [employer] (2-3 miles, 3x/week)   OBJECTIVE:  PT Treatment:  Alternating Step Taps: at the stairs standing on airex without UE support completed alternating toe taps to 1st step, 2 x 10 reps. Mild rest break required. CGA    PATIENT EDUCATION: Education details:  *** Person educated: Patient and Wellsite geologist: Explanation Education comprehension: verbalized understanding   HOME EXERCISE PROGRAM: Access Code: [email or web address]  ASSESSMENT:  CLINICAL IMPRESSION: Today's skilled PT session included ***   REHAB POTENTIAL: Good  CLINICAL DECISION MAKING: Evolving/moderate complexity  EVALUATION COMPLEXITY: Moderate   GOALS: Goals reviewed with patient? Yes  UPDATED SHORT TERM GOALS:  STG Name Target Date Goal status  1 Patient will demonstrate independence with initial balance HEP Baseline: no HEP established; reports independence with HEP 10/03/2021 MET  2 Patient will improve 5x sit <> stand to </= 15 seconds to demo improved balance Baseline: 17.25 secs; 15.91 secs with UE support 10/03/2021 PARTIALLY MET    UPDATED LONG TERM GOALS:   LTG Name Target Date Goal status  1 Pt will improve FGA to >/= 23  to demonstrate improved balance and reduced fall risk Baseline: 19/20 10/31/2021 INITIAL  2 Pt will improve 5x STS to </= 12 seconds to demo improved balance Baseline: 17.25 secs with BUE support 10/31/2021 INITIAL  3 Patient will verbalize understanding of fall prevention to improve safety within the home and reduce fall risk Baseline: 6 falls in 5 months 10/31/2021 INITIAL  4 Patient will improve situation 4 of M-CTSIB to >/= 25 seconds Baseline: 20 seconds 10/31/2021 INITIAL  5 Patient will improve TUG to </=  10 seconds for decreased fall risk Baseline: 11.63 seconds 10/31/2021 INITIAL     PLAN: PT FREQUENCY: 2x/week  PT DURATION: other: 3 weeks, then 1x/week for 3 weeks  PLANNED INTERVENTIONS: Therapeutic exercises, Therapeutic activity, Neuro Muscular re-education, Balance training, Gait training, Patient/Family education, Joint mobilization, Stair training, Vestibular training, Canalith repositioning, and DME instructions  PLAN FOR NEXT SESSION: Continue balance on complaint surfaces; high level balance activities; SLS/obstacle negotiation. Vestibular input for balance.    [place], DPT 10/05/2021, 5:34 PM  [facility] [address] [place], [place], [address] Phone: [phone]   Fax:  [phone]

## 2021-10-06 ENCOUNTER — Ambulatory Visit: Payer: Medicare Other

## 2021-10-06 ENCOUNTER — Other Ambulatory Visit: Payer: Self-pay

## 2021-10-06 DIAGNOSIS — R2689 Other abnormalities of gait and mobility: Secondary | ICD-10-CM

## 2021-10-06 DIAGNOSIS — R2681 Unsteadiness on feet: Secondary | ICD-10-CM

## 2021-10-06 DIAGNOSIS — M6281 Muscle weakness (generalized): Secondary | ICD-10-CM

## 2021-10-06 MED ORDER — LINAGLIPTIN 5 MG PO TABS: 5.0000 mg | ORAL_TABLET | Freq: Every day | ORAL | 3 refills | Status: DC

## 2021-10-06 NOTE — Therapy (Addendum)
OUTPATIENT PHYSICAL THERAPY TREATMENT   Patient Name: Antonio Clark MRN: [id number] DOB:November 10, 1935, 86 y.o., male Today's Date: 10/06/2021  PCP: Susy Frizzle, MD REFERRING PROVIDER: Susy Frizzle, MD    PT End of Session - 10/06/21 1444     Visit Number 9    Number of Visits 12    Date for PT Re-Evaluation 10/31/21    Authorization Type [employer] MCR    Progress Note Due on Visit 10    PT Start Time 9629    PT Stop Time 1528    PT Time Calculation (min) 43 min    Equipment Utilized During Treatment Gait belt    Activity Tolerance Patient tolerated treatment well    Behavior During Therapy WFL for tasks assessed/performed                    Past Medical History:  Diagnosis Date   Anal fissure    Arthritis    Atrial fibrillation ([place])    Back pain    Colon polyps    Diabetes mellitus type 2 with complications ([place])    Dysrhythmia    a-fib   GERD (gastroesophageal reflux disease)    History of echocardiogram 07/ 07/ 2011   History of lithotripsy 1989   Hyperlipidemia    Hypertension    Kidney stones    Melanoma ([place]) 05/01/2019   right chest wall (12/20)   Prostate CA ([place]) 2010   Sleep apnea    uses CPAP nightly   Squamous cell carcinoma of skin 05/23/1992   bowens-left parietal scalp (CX35FU)   Squamous cell carcinoma of skin 03/14/2008   in situ-left upper outer forehead-medial (CX35FU)   Squamous cell carcinoma of skin 03/14/2008   in situ-crown of scalp (Cx35FU)   Squamous cell carcinoma of skin 04/15/2011   in situ-right dorsal forearm (txpbx)   Squamous cell carcinoma of skin 10/16/2011   in situ-left sideburn   Squamous cell carcinoma of skin 03/11/2015   ka-left sideburn (CX35FU)   Squamous cell carcinoma of skin 03/11/2015   ka-left forearm (CX35FU)   Squamous cell carcinoma of skin 06/22/2018   in situ-left forearm, sup (txpbx)   Squamous cell carcinoma of skin 05/01/2019   in situ-mid anterior scalp    Squamous cell carcinoma of  skin 05/01/2019   in situ-right upper arm   Past Surgical History:  Procedure Laterality Date   ATRIAL FIBRILLATION ABLATION N/A 04/17/2020   Procedure: [place];  Surgeon: Vickie Epley, MD;  Location: [place] [facility];  Service: Cardiovascular;  Laterality: N/A;   AXILLARY SENTINEL NODE BIOPSY Right 08/23/2019   Procedure: SENTINEL LYMPH NODE BIOSPY RIGHT AXILLA;  Surgeon: Stark Klein, MD;  Location: [place];  Service: General;  Laterality: Right;   CARDIOVERSION N/A 05/10/2020   Procedure: CARDIOVERSION;  Surgeon: Freada Bergeron, MD;  Location: [facility] ENDOSCOPY;  Service: Cardiovascular;  Laterality: N/A;   CARDIOVERSION N/A 05/08/2021   Procedure: CARDIOVERSION;  Surgeon: Donato Heinz, MD;  Location: [facility] ENDOSCOPY;  Service: Cardiovascular;  Laterality: N/A;   COLONOSCOPY  2002   FASCIECTOMY Left 03/28/2019   Procedure: SEGMENTAL FASCIECTOMY LEFT RING FINGER;  Surgeon: Daryll Brod, MD;  Location: [place];  Service: Orthopedics;  Laterality: Left;  ANESTHESIA  AXILLARY BLOCK   INSERTION PROSTATE RADIATION SEED  8 4 2010   per Dr Rosana Hoes   MELANOMA EXCISION Right 06/22/2019   Procedure: WIDE LOCAL EXCISION RIGHT CHEST WALL MELANOMA, ADVANCEMENT  FLAP CLOSURE FOR DEFECT 3X6 CM;  Surgeon: Stark Klein, MD;  Location: [place];  Service: General;  Laterality: Right;   POLYPECTOMY  2002   THROAT SURGERY  1980   benign cyst   UPPER GASTROINTESTINAL ENDOSCOPY     URETHRAL STRICTURE DILATATION     also penile implant   Patient Active Problem List   Diagnosis Date Noted   Orthostatic hypotension 05/02/2021   Melanoma ([place])    Atrial fibrillation ([place])    Malignant neoplasm of prostate ([place]) 07/15/2015   Recurrent nephrolithiasis 07/15/2015   Personal history of prostate cancer 04/25/2015   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/09/2013   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   Coronary artery disease  11/02/2011   Urethral stricture 06/22/2011   Hyperlipidemia    Borderline diabetic    Diabetes mellitus with coincident hypertension ([place])     ONSET DATE: 08/06/2021   REFERRING DIAG: Diagnosis M16.11 (ICD-10-CM) - Primary osteoarthritis of right hip R26.81 (ICD-10-CM) - Unsteadiness on feet R26.89 (ICD-10-CM) - Balance problem M54.50 (ICD-10-CM) - Low back pain at multiple sites   THERAPY DIAG:  Unsteadiness on feet  Muscle weakness (generalized)  Other abnormalities of gait and mobility  SUBJECTIVE:   SUBJECTIVE  Patient reports for the next two weeks (on Wednesday), is having cataract surgery on bilateral eyes. No other new changes. Reports back is bothering him some today. No falls.                                                                                                                               PERTINENT HISTORY: Ataxia, Arthritis, Back Pain, DM Type 2, GERD, HLD, HTN, Melanoma, Prostate CA, Squamous cell carcinoma of skin  PAIN:  Are you having pain? Yes VAS scale: 4/10, in Low Back/ R Hip, Ache  PRECAUTIONS: Fall  WEIGHT BEARING RESTRICTIONS No  LIVING ENVIRONMENT: Lives with: lives alone Lives in: House/apartment Stairs: Yes; Internal: 2 steps; on right going up and External: 3 steps; on right going up (reports primarily comes in through garage, no rails) Has following equipment at home: Quad cane small base  PLOF: Independent  PATIENT GOALS: To improve my balance and prevent further falls; Get back to [facility] [employer] (2-3 miles, 3x/week)   OBJECTIVE:  PT Treatment:  Completed warm up on SciFit for improved activity tolerance/strengthening on Level 2.5 with BLE/BUE x 6 minutes, cues to maintain pace >/= 75 steps/minute. Pt tolerating well.   Standing Hip Abduction: Completed on BLE's 2 x 10 reps each, cues to maintain upright posture. Standing Hip Extension: Completed on BLE's 2 x 10 reps each, cues to maintain upright posture and avoid forward  lean, as well as keeping knee straight with hip extension.   Single Leg Stance: completed on firm surface with light fingertip support on BLE, 3 x 5-10 seconds each. More challenge with SLS on LLE > RLE.  Romberg Stance: standing narrow BOS on incline with  eyes closed, 3 x 30 seconds. Backwards Walking: Completed backwards walking, cues for step length to promote improved posterior stepping. Completed 2 x 50' w/ CGA Alternating Step Taps: at the stairs standing on airex without UE support completed alternating toe taps to 2nd step, 2 x 10 reps. Cues required for light tap to promote SLS and control. Increased challenge due to toe catching of 1st step.   Marching: standing on incline, completed alternating marching with eyes open, 3 x 30 seconds, increased challenge noted requiring CGA from PT due to imbalance Obstacle Negotiation: Completed obstacle negotiation including forward stepping over hurdles 4 x 25', cues to avoid circumduction and trying to maintain gait speed prior to stepping over vs slowed pace, CGA.  Gait with Head Turns: Completed gait 2 x 115 ft with horizontal/vertical head turns, mild veering noted with horizontal > vertical.     PATIENT EDUCATION: Education details:  Continue HEP; Will need to see if have any precautions regarding therapy after eye surgery Person educated: Patient and Caregiver Education method: Explanation Education comprehension: verbalized understanding   HOME EXERCISE PROGRAM: Access Code: [email or web address]  ASSESSMENT:  CLINICAL IMPRESSION: Today's skilled PT session included strengthening, and continued high level balance activities. Continued focus on tasks promoting SLS activities and balance on complaint surfaces. Patient is scheduled to have cataract surgery scheduled on Wednesday, PT educating to return with any precautions for therapy services, patient verbalize understanding. Will continue per POC.     REHAB POTENTIAL: Good  CLINICAL DECISION  MAKING: Evolving/moderate complexity  EVALUATION COMPLEXITY: Moderate   GOALS: Goals reviewed with patient? Yes  UPDATED SHORT TERM GOALS:  STG Name Target Date Goal status  1 Patient will demonstrate independence with initial balance HEP Baseline: no HEP established; reports independence with HEP 10/03/2021 MET  2 Patient will improve 5x sit <> stand to </= 15 seconds to demo improved balance Baseline: 17.25 secs; 15.91 secs with UE support 10/03/2021 PARTIALLY MET    UPDATED LONG TERM GOALS:   LTG Name Target Date Goal status  1 Pt will improve FGA to >/= 23 to demonstrate improved balance and reduced fall risk Baseline: 19/20 10/31/2021 INITIAL  2 Pt will improve 5x STS to </= 12 seconds to demo improved balance Baseline: 17.25 secs with BUE support 10/31/2021 INITIAL  3 Patient will verbalize understanding of fall prevention to improve safety within the home and reduce fall risk Baseline: 6 falls in 5 months 10/31/2021 INITIAL  4 Patient will improve situation 4 of M-CTSIB to >/= 25 seconds Baseline: 20 seconds 10/31/2021 INITIAL  5 Patient will improve TUG to </= 10 seconds for decreased fall risk Baseline: 11.63 seconds 10/31/2021 INITIAL     PLAN: PT FREQUENCY: 2x/week  PT DURATION: other: 3 weeks, then 1x/week for 3 weeks  PLANNED INTERVENTIONS: Therapeutic exercises, Therapeutic activity, Neuro Muscular re-education, Balance training, Gait training, Patient/Family education, Joint mobilization, Stair training, Vestibular training, Canalith repositioning, and DME instructions  PLAN FOR NEXT SESSION: Do we have precautions since the cataract surgery? Continue balance on complaint surfaces; high level balance activities; SLS/obstacle negotiation. Vestibular input for balance.    [place], DPT 10/06/2021, 3:31 PM  [facility] [address] [place], [place], [address] Phone: [phone]   Fax:   [phone]

## 2021-10-08 ENCOUNTER — Telehealth: Payer: Self-pay | Admitting: Family Medicine

## 2021-10-08 ENCOUNTER — Ambulatory Visit: Payer: Medicare Other | Admitting: Physical Therapy

## 2021-10-08 NOTE — Telephone Encounter (Signed)
Left message for patient to call back and schedule Medicare Annual Wellness Visit (AWV) in office.  ? ?If not able to come in office, please offer to do virtually or by telephone.  Left office number and my jabber [phone]. ? ?Last AWV:06/07/2020 ? ?Please schedule at anytime with Nurse Health Advisor. ?  ?

## 2021-10-13 ENCOUNTER — Other Ambulatory Visit: Payer: Self-pay

## 2021-10-13 ENCOUNTER — Ambulatory Visit: Payer: Medicare Other | Attending: Family Medicine

## 2021-10-13 DIAGNOSIS — R2689 Other abnormalities of gait and mobility: Secondary | ICD-10-CM | POA: Insufficient documentation

## 2021-10-13 DIAGNOSIS — R2681 Unsteadiness on feet: Secondary | ICD-10-CM | POA: Insufficient documentation

## 2021-10-13 DIAGNOSIS — M6281 Muscle weakness (generalized): Secondary | ICD-10-CM | POA: Diagnosis present

## 2021-10-13 NOTE — Therapy (Signed)
OUTPATIENT PHYSICAL THERAPY TREATMENT/PROGRESS NOTE   Patient Name: Antonio Clark MRN: [id number] DOB:09/26/1935, 86 y.o., male Today's Date: 10/13/2021  PCP: Susy Frizzle, MD REFERRING PROVIDER: Susy Frizzle, MD Physical Therapy Progress Note   Dates of Reporting Period: 08/19/21 - 10/13/21  See Note below for Objective Data and Assessment of Progress/Goals.  Thank you for the referral of this patient. Guillermina [place], PT, DPT    PT End of Session - 10/13/21 1316     Visit Number 10    Number of Visits 12    Date for PT Re-Evaluation 10/31/21    Authorization Type [employer] MCR    Progress Note Due on Visit 10    PT Start Time 1884    PT Stop Time 1358    PT Time Calculation (min) 41 min    Equipment Utilized During Treatment Gait belt    Activity Tolerance Patient tolerated treatment well    Behavior During Therapy WFL for tasks assessed/performed                    Past Medical History:  Diagnosis Date   Anal fissure    Arthritis    Atrial fibrillation ([place])    Back pain    Colon polyps    Diabetes mellitus type 2 with complications ([place])    Dysrhythmia    a-fib   GERD (gastroesophageal reflux disease)    History of echocardiogram 07/ 07/ 2011   History of lithotripsy 1989   Hyperlipidemia    Hypertension    Kidney stones    Melanoma ([place]) 05/01/2019   right chest wall (12/20)   Prostate CA ([place]) 2010   Sleep apnea    uses CPAP nightly   Squamous cell carcinoma of skin 05/23/1992   bowens-left parietal scalp (CX35FU)   Squamous cell carcinoma of skin 03/14/2008   in situ-left upper outer forehead-medial (CX35FU)   Squamous cell carcinoma of skin 03/14/2008   in situ-crown of scalp (Cx35FU)   Squamous cell carcinoma of skin 04/15/2011   in situ-right dorsal forearm (txpbx)   Squamous cell carcinoma of skin 10/16/2011   in situ-left sideburn   Squamous cell carcinoma of skin 03/11/2015   ka-left sideburn (CX35FU)   Squamous cell carcinoma  of skin 03/11/2015   ka-left forearm (CX35FU)   Squamous cell carcinoma of skin 06/22/2018   in situ-left forearm, sup (txpbx)   Squamous cell carcinoma of skin 05/01/2019   in situ-mid anterior scalp    Squamous cell carcinoma of skin 05/01/2019   in situ-right upper arm   Past Surgical History:  Procedure Laterality Date   ATRIAL FIBRILLATION ABLATION N/A 04/17/2020   Procedure: Long Neck;  Surgeon: Vickie Epley, MD;  Location: [facility];  Service: Cardiovascular;  Laterality: N/A;   AXILLARY SENTINEL NODE BIOPSY Right 08/23/2019   Procedure: SENTINEL LYMPH NODE BIOSPY RIGHT AXILLA;  Surgeon: Stark Klein, MD;  Location: [facility];  Service: General;  Laterality: Right;   CARDIOVERSION N/A 05/10/2020   Procedure: CARDIOVERSION;  Surgeon: Freada Bergeron, MD;  [facility] ENDOSCOPY;  Service: Cardiovascular;  Laterality: N/A;   CARDIOVERSION N/A 05/08/2021   Procedure: CARDIOVERSION;  Surgeon: Donato Heinz, MD;  Location[facility] ENDOSCOPY;  Service: Cardiovascular;  Laterality: N/A;   COLONOSCOPY  2002   FASCIECTOMY Left 03/28/2019   Procedure: SEGMENTAL FASCIECTOMY LEFT RING FINGER;  Surgeon: Daryll Brod, MD;  Location: [facility];  Service: Orthopedics;  Laterality: Left;  ANESTHESIA  AXILLARY BLOCK   INSERTION PROSTATE RADIATION SEED  8 4 2010   per Dr Rosana Hoes   MELANOMA EXCISION Right 06/22/2019   Procedure: WIDE LOCAL EXCISION RIGHT CHEST WALL MELANOMA, ADVANCEMENT FLAP CLOSURE FOR DEFECT 3X6 CM;  Surgeon: Stark Klein, MD;  Location: [facility];  Service: General;  Laterality: Right;   POLYPECTOMY  2002   THROAT SURGERY  1980   benign cyst   UPPER GASTROINTESTINAL ENDOSCOPY     URETHRAL STRICTURE DILATATION     also penile implant   Patient Active Problem List   Diagnosis Date Noted   Orthostatic hypotension 05/02/2021   Melanoma ([place])    Atrial fibrillation ([place])    Malignant neoplasm  of prostate ([place]) 07/15/2015   Recurrent nephrolithiasis 07/15/2015   Personal history of prostate cancer 04/25/2015   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/09/2013   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 Reports cataract surgery went well. Has other eye on Wednesday. No falls.                                                                                                                    PERTINENT HISTORY: Ataxia, Arthritis, Back Pain, DM Type 2, GERD, HLD, HTN, Melanoma, Prostate CA, Squamous cell carcinoma of skin  PAIN:  Are you having pain? Yes VAS scale: 4/10, in Low Back/R Hip, Ache  PRECAUTIONS: Fall  WEIGHT BEARING RESTRICTIONS No  LIVING ENVIRONMENT: Lives with: lives alone Lives in: House/apartment Stairs: Yes; Internal: 2 steps; on right going up and External: 3 steps; on right going up (reports primarily comes in through garage, no rails) Has following equipment at home: Quad cane small base  PLOF: Independent  PATIENT GOALS: To improve my balance and prevent further falls; [facility] back to [facility] [employer] (2-3 miles, 3x/week)   OBJECTIVE:  PT Treatment:  TherEx: Completed warm up on SciFit for improved activity tolerance/strengthening on Level 3.0 with BLE/BUE x 6 minutes. Pt tolerating increased resistance well today.    NMR: Completed sit <> stand training without UE support on firm surface x  10 reps. Then transitioned to BLE on airex without UE support x 6 reps. Increased posterior lean noted 1-2 times requiring CGA. Cues for forward lean. Educated to continue to practice at home without UE support on firm surface.   Side Stepping: on blue balance beam without UE support, completed lateral side stepping down and back x 3 laps. Rockerboard:  completed step up onto Rockerboard positioned A/P with opposite LE completed toe tap to cone x 10 reps bilat, with UE support required. Then standing holding board steady with EO 2 x 30 seconds without UE support, trialed adding in horiz/vertical head turns x 10 reps each, more challenge noted requiring light touch from // bars.  Standing Balance: Surface: Airex and Pillows Position: Narrow Base of Support Feet Hip Width Apart Completed with: Eyes Open and Eyes Closed; Head Turns x 10 Reps and Head Nods x 10 Reps Completed EC 3 x 30 seconds with bil stance, then with romberg completed EO and horizontal/vertical head turns x 10 reps each.   Single Leg Stance:   Surface: Floor  Lower Extremity: BLE  Time: completed on BLE, able to hold on RLE for approx 5-7 seconds. On LLE only  able to complete 3-5 seconds due to imbalance. Intermittent UE support required.   Tandem Stance:  Surface: Floor Completed with: Eyes Open;   Time: 2 x 30 seconds, alternating foot position with full tandem     PATIENT EDUCATION: Education details:  Continue HEP Person educated: Patient and Armed forces training and education officer method: Explanation Education comprehension: verbalized understanding   HOME EXERCISE PROGRAM: Access Code: [email or web address]  ASSESSMENT:  CLINICAL IMPRESSION: Today's skilled PT session focused on continued balance activities working toward more complaint surfaces. Continue to demo posterior lean at times requiring cues from PT. Most noted with sit <> stand today. Will continue per POC. Patient is making steady progress with PT and will continue to benefit from  skilled PT services.   REHAB POTENTIAL: Good  CLINICAL DECISION MAKING: Evolving/moderate complexity  EVALUATION COMPLEXITY: Moderate   GOALS: Goals reviewed with patient? Yes  UPDATED SHORT TERM GOALS:  STG Name Target Date Goal status  1 Patient will demonstrate independence with initial balance HEP Baseline: no HEP established; reports independence with HEP 10/03/2021 MET  2 Patient will improve 5x sit <> stand to </= 15 seconds to demo improved balance Baseline: 17.25 secs; 15.91 secs with UE support 10/03/2021 PARTIALLY MET    UPDATED LONG TERM GOALS:   LTG Name Target Date Goal status  1 Pt will improve FGA to >/= 23 to demonstrate improved balance and reduced fall risk Baseline: 19/20 10/31/2021 INITIAL  2 Pt will improve 5x STS to </= 12 seconds to demo improved balance Baseline: 17.25 secs with BUE support 10/31/2021 INITIAL  3 Patient will verbalize understanding of fall prevention to improve safety within the home and reduce fall risk Baseline: 6 falls in 5 months 10/31/2021 INITIAL  4 Patient will improve situation 4 of M-CTSIB to >/= 25 seconds Baseline: 20 seconds 10/31/2021 INITIAL  5 Patient will improve TUG to </= 10 seconds for decreased fall risk Baseline: 11.63 seconds 10/31/2021 INITIAL     PLAN: PT FREQUENCY: 2x/week  PT DURATION: other: 3 weeks, then 1x/week for 3 weeks  PLANNED INTERVENTIONS: Therapeutic exercises, Therapeutic activity, Neuro Muscular re-education, Balance training, Gait training, Patient/Family education, Joint mobilization, Stair training, Vestibular training, Canalith repositioning, and DME instructions  PLAN FOR NEXT SESSION: Continue balance on complaint surfaces; high level balance activities; SLS/obstacle negotiation. Vestibular input for balance.    [place], DPT 10/13/2021, 2:01 PM  [facility] [address] [place], [place], [address] Phone: [phone]    Fax:  [phone]

## 2021-10-20 ENCOUNTER — Ambulatory Visit: Payer: Medicare Other

## 2021-10-23 ENCOUNTER — Ambulatory Visit
Admission: RE | Admit: 2021-10-23 | Discharge: 2021-10-23 | Disposition: A | Discharge status: Home / Self Care | Payer: Medicare Other | Source: Ambulatory Visit | Attending: Family Medicine | Admitting: Family Medicine

## 2021-10-23 ENCOUNTER — Other Ambulatory Visit: Payer: Self-pay

## 2021-10-23 ENCOUNTER — Ambulatory Visit: Payer: Medicare Other | Admitting: Cardiology

## 2021-10-23 ENCOUNTER — Encounter: Payer: Self-pay | Admitting: Family Medicine

## 2021-10-23 ENCOUNTER — Ambulatory Visit (INDEPENDENT_AMBULATORY_CARE_PROVIDER_SITE_OTHER): Payer: Medicare Other | Admitting: Family Medicine

## 2021-10-23 VITALS — BP 132/82 | HR 77 | Temp 97.2°F | Resp 18 | Ht 69.0 in | Wt 178.0 lb

## 2021-10-23 DIAGNOSIS — R091 Pleurisy: Secondary | ICD-10-CM | POA: Diagnosis not present

## 2021-10-23 NOTE — Progress Notes (Signed)
? ?Subjective:  ? ? Patient ID: Antonio Clark, male    DOB: 06-Nov-1935, 86 y.o.   MRN: [id number] ?Patient recently tripped over a Marketing executive.  He lost his balance and fell on his hands and knees catching himself mainly with his right arm.  He fell essentially in the position of a push-up.  He now has a large bruise over his anterior right chest at the insertion of the pectoralis major muscle.  The bruise is turning yellow and brown.  There is no active bleeding seen.  He is very tender to palpation in that area.  He reports some tenderness and pain when he takes a deep breath then however his lungs are completely clear with symmetric breath sounds.  There is no wheezing crackles or rails.  He denies any shortness of breath.  He does report pain whenever he coughs but he has not been coughing more.  He denies any fevers or chills. ?Past Medical History:  ?Diagnosis Date  ? Anal fissure   ? Arthritis   ? Atrial fibrillation ([place])   ? Back pain   ? Colon polyps   ? Diabetes mellitus type 2 with complications ([place])   ? Dysrhythmia   ? a-fib  ? GERD (gastroesophageal reflux disease)   ? History of echocardiogram 07/ 07/ 2011  ? History of lithotripsy 1989  ? Hyperlipidemia   ? Hypertension   ? Kidney stones   ? Melanoma ([place]) 05/01/2019  ? right chest wall (12/20)  ? Prostate CA [facility]) 2010  ? Sleep apnea   ? uses CPAP nightly  ? Squamous cell carcinoma of skin 05/23/1992  ? bowens-left parietal scalp (CX35FU)  ? Squamous cell carcinoma of skin 03/14/2008  ? in situ-left upper outer forehead-medial (CX35FU)  ? Squamous cell carcinoma of skin 03/14/2008  ? in situ-crown of scalp (Cx35FU)  ? Squamous cell carcinoma of skin 04/15/2011  ? in situ-right dorsal forearm (txpbx)  ? Squamous cell carcinoma of skin 10/16/2011  ? in situ-left sideburn  ? Squamous cell carcinoma of skin 03/11/2015  ? ka-left sideburn (CX35FU)  ? Squamous cell carcinoma of skin 03/11/2015  ? ka-left forearm (CX35FU)  ? Squamous cell carcinoma of  skin 06/22/2018  ? in situ-left forearm, sup (txpbx)  ? Squamous cell carcinoma of skin 05/01/2019  ? in situ-mid anterior scalp   ? Squamous cell carcinoma of skin 05/01/2019  ? in situ-right upper arm  ? ?Past Surgical History:  ?Procedure Laterality Date  ? ATRIAL FIBRILLATION ABLATION N/A 04/17/2020  ? Procedure: ATRIAL FIBRILLATION ABLATION;  Surgeon: Vickie Epley, MD;  Location: [place] [facility];  Service: Cardiovascular;  Laterality: N/A;  ? AXILLARY SENTINEL NODE BIOPSY Right 08/23/2019  ? Procedure: SENTINEL LYMPH NODE BIOSPY RIGHT AXILLA;  Surgeon: Stark Klein, MD;  Location: [facility];  Service: General;  Laterality: Right;  ? CARDIOVERSION N/A 05/10/2020  ? Procedure: CARDIOVERSION;  Surgeon: Freada Bergeron, MD;  Location: [facility] ENDOSCOPY;  Service: Cardiovascular;  Laterality: N/A;  ? CARDIOVERSION N/A 05/08/2021  ? Procedure: CARDIOVERSION;  Surgeon: Donato Heinz, MD;  Location: [facility] ENDOSCOPY;  Service: Cardiovascular;  Laterality: N/A;  ? COLONOSCOPY  2002  ? FASCIECTOMY Left 03/28/2019  ? Procedure: SEGMENTAL FASCIECTOMY LEFT RING FINGER;  Surgeon: Daryll Brod, MD;  Location: [facility];  Service: Orthopedics;  Laterality: Left;  ANESTHESIA  AXILLARY BLOCK  ? INSERTION PROSTATE RADIATION SEED  8 4 2010  ? per Dr Rosana Hoes  ? MELANOMA EXCISION  Right 06/22/2019  ? Procedure: WIDE LOCAL EXCISION RIGHT CHEST WALL MELANOMA, ADVANCEMENT FLAP CLOSURE FOR DEFECT 3X6 CM;  Surgeon: Stark Klein, MD;  Location: [facility];  Service: General;  Laterality: Right;  ? POLYPECTOMY  2002  ? THROAT SURGERY  1980  ? benign cyst  ? UPPER GASTROINTESTINAL ENDOSCOPY    ? URETHRAL STRICTURE DILATATION    ? also penile implant  ? ?Current Outpatient Medications on File Prior to Visit  ?Medication Sig Dispense Refill  ? atorvastatin (LIPITOR) 40 MG tablet TAKE 1 TABLET (40 MG TOTAL) BY MOUTH DAILY. PLEASE STOP PRAVASTATIN 90 tablet 3  ? cetirizine (ZYRTEC) 10 MG  tablet Take 10 mg by mouth daily as needed for allergies.    ? finasteride (PROSCAR) 5 MG tablet Take 5 mg by mouth daily.    ? fluticasone (FLONASE) 50 MCG/ACT nasal spray Place 1 spray into both nostrils daily as needed for allergies or rhinitis.    ? linagliptin (TRADJENTA) 5 MG TABS tablet Take 1 tablet (5 mg total) by mouth daily. 90 tablet 3  ? LORazepam (ATIVAN) 0.5 MG tablet TAKE 1 TABLET BY MOUTH EVERY DAY AS NEEDED FOR ANXIETY 30 tablet 1  ? metoprolol tartrate (LOPRESSOR) 25 MG tablet Take 1 tablet (25 mg total) by mouth 2 (two) times daily. 60 tablet 11  ? PRADAXA 150 MG CAPS capsule TAKE 1 CAPSULE BY MOUTH TWICE A DAY 180 capsule 1  ? ?No current facility-administered medications on file prior to visit.  ? ?No Known Allergies ?Social History  ? ?Socioeconomic History  ? Marital status: Single  ?  Spouse name: Not on file  ? Number of children: 0  ? Years of education: Not on file  ? Highest education level: Not on file  ?Occupational History  ? Occupation: retired  ?Tobacco Use  ? Smoking status: Never  ? Smokeless tobacco: Never  ?Vaping Use  ? Vaping Use: Never used  ?Substance and Sexual Activity  ? Alcohol use: Not Currently  ?  Comment: social  ? Drug use: No  ? Sexual activity: Yes  ?Other Topics Concern  ? Not on file  ?Social History Narrative  ? Not on file  ? ?Social Determinants of Health  ? ?Financial Resource Strain: Not on file  ?Food Insecurity: Not on file  ?Transportation Needs: Not on file  ?Physical Activity: Not on file  ?Stress: Not on file  ?Social Connections: Not on file  ?Intimate Partner Violence: Not on file  ? ?Family History  ?Problem Relation Age of Onset  ? Coronary artery disease Brother   ?     3 brothers had CABG  ? Arrhythmia Brother   ? Diabetes Brother   ? Hearing loss Brother   ? Hypertension Brother   ? Heart disease Brother   ? Heart failure Brother   ? Hypertension Brother   ? Heart disease Father   ? Hypertension Father   ? Sudden death Father   ? Stroke Mother    ?     cerebral hemorrage  ? Colon cancer Mother 61  ?     small intestine  ? Depression Mother   ? Stroke Sister   ? Suicidality Sister   ? Diabetes Brother   ? Hypertension Brother   ? Hyperlipidemia Sister   ? Esophageal cancer Neg Hx   ? Pancreatic cancer Neg Hx   ? Prostate cancer Neg Hx   ? Rectal cancer Neg Hx   ? Stomach cancer Neg  Hx   ? ? ? ?Review of Systems  ?Respiratory:  Positive for cough.   ?All other systems reviewed and are negative. ? ?   ?Objective:  ? Physical Exam ?Vitals reviewed.  ?Constitutional:   ?   General: He is not in acute distress. ?   Appearance: He is well-developed. He is not diaphoretic.  ?HENT:  ?   Head: Normocephalic and atraumatic.  ?   Right Ear: External ear normal.  ?   Left Ear: External ear normal.  ?   Nose: Nose normal.  ?   Mouth/Throat:  ?   Pharynx: No oropharyngeal exudate.  ?Eyes:  ?   General: No scleral icterus.    ?   Right eye: No discharge.     ?   Left eye: No discharge.  ?   Conjunctiva/sclera: Conjunctivae normal.  ?   Pupils: Pupils are equal, round, and reactive to light.  ?Neck:  ?   Thyroid: No thyromegaly.  ?   Vascular: No JVD.  ?   Trachea: No tracheal deviation.  ?Cardiovascular:  ?   Rate and Rhythm: Normal rate and regular rhythm.  ?   Heart sounds: Normal heart sounds. No murmur heard. ?  No friction rub. No gallop.  ?Pulmonary:  ?   Effort: Pulmonary effort is normal. No respiratory distress.  ?   Breath sounds: No stridor. No wheezing, rhonchi or rales.  ?Chest:  ?   Chest wall: Tenderness present.  ? ? ?Abdominal:  ?   General: Bowel sounds are normal. There is no distension.  ?   Palpations: Abdomen is soft. There is no mass.  ?   Tenderness: There is no abdominal tenderness. There is no guarding or rebound.  ?Musculoskeletal:     ?   General: No tenderness. Normal range of motion.  ?   Cervical back: Normal range of motion and neck supple.  ?Lymphadenopathy:  ?   Cervical: No cervical adenopathy.  ?Skin: ?   General: Skin is warm.  ?    Coloration: Skin is not pale.  ?   Findings: No erythema or rash.  ?Neurological:  ?   Mental Status: He is alert and oriented to person, place, and time.  ?   Cranial Nerves: No cranial nerve deficit.  ?   M

## 2021-10-27 ENCOUNTER — Ambulatory Visit: Payer: Medicare Other

## 2021-10-27 ENCOUNTER — Other Ambulatory Visit: Payer: Self-pay

## 2021-10-27 DIAGNOSIS — R2681 Unsteadiness on feet: Secondary | ICD-10-CM | POA: Diagnosis not present

## 2021-10-27 DIAGNOSIS — R2689 Other abnormalities of gait and mobility: Secondary | ICD-10-CM

## 2021-10-27 DIAGNOSIS — M6281 Muscle weakness (generalized): Secondary | ICD-10-CM

## 2021-10-27 NOTE — Patient Instructions (Signed)
WALKING ?  ?Walking is a great form of exercise to increase your strength, endurance and overall fitness.  A walking program can help you start slowly and gradually build endurance as you go.  Everyone's ability is different, so each person's starting point will be different.  You do not have to follow them exactly.  The are just samples. You should simply find out what's right for you and stick to that program.   ?In the beginning, you'll start off walking 2-3 times a day for short distances.  As you get stronger, you'll be walking further at just 1-2 times per day. ?  ?A.         You Can Walk For A Certain Length Of Time Each Day ?             ?            Walk 6 minutes 2 times per day. ?            Increase 1-2 minutes every 3-4 days. ?            Work up to 25-30 minutes (1-2 times per day). ?  ?            Example: ?                        Day 1-2           6 minutes        2 times per day ?                        Day 7-8           7-8 minutes     2-3 times per day ?                        Day 13-14       10-12 minutes 1-2 times per day ?  ?B.         You Can Walk For a Certain Distance Each Day ?                         ?            Distance can be substituted for time. ?             ?            Example: ?                        Driveway ([address] x 9 times/laps. Then every 1-2 days increase by 1 lap.  ?

## 2021-10-27 NOTE — Therapy (Signed)
?OUTPATIENT PHYSICAL THERAPY TREATMENT/DISCHARGE SUMMARY ? ? ?Patient Name: Antonio Clark ?MRN: [id number] ?DOB:1936-06-16, 86 y.o., male ?Today's Date: 10/27/2021 ? ?PCP: Susy Frizzle, MD ?REFERRING PROVIDER: Susy Frizzle, MD ? ?PHYSICAL THERAPY DISCHARGE SUMMARY ? ?Visits from Start of Care: 11 ? ?Current functional level related to goals / functional outcomes: ?See Clinical Impression Statement ?  ?Remaining deficits: ?Mild Imbalance, Low Fall Risk ?  ?Education / Equipment: ?Location manager, HEP, Walking Program  ? ?Patient agrees to discharge. Patient goals were partially met. Patient is being discharged due to being pleased with the current functional level. ? ? ? PT End of Session - 10/27/21 1409   ? ? Visit Number 11   ? Number of Visits 12   ? Date for PT Re-Evaluation 10/31/21   ? Authorization Type [employer] MCR   ? Progress Note Due on Visit 10   ? PT Start Time 1443   pt arrived late  ? PT Stop Time 1540   ? PT Time Calculation (min) 35 min   ? Equipment Utilized During Treatment Gait belt   ? Activity Tolerance Patient tolerated treatment well   ? Behavior During Therapy [facility] for tasks assessed/performed   ? ?  ?  ? ?  ? ? ? ? ? ? ? ? ? ? ?Past Medical History:  ?Diagnosis Date  ? Anal fissure   ? Arthritis   ? Atrial fibrillation ([place])   ? Back pain   ? Colon polyps   ? Diabetes mellitus type 2 with complications ([place])   ? Dysrhythmia   ? a-fib  ? GERD (gastroesophageal reflux disease)   ? History of echocardiogram 07/ 07/ 2011  ? History of lithotripsy 1989  ? Hyperlipidemia   ? Hypertension   ? Kidney stones   ? Melanoma ([place]) 05/01/2019  ? right chest wall (12/20)  ? Prostate CA [facility]) 2010  ? Sleep apnea   ? uses CPAP nightly  ? Squamous cell carcinoma of skin 05/23/1992  ? bowens-left parietal scalp (CX35FU)  ? Squamous cell carcinoma of skin 03/14/2008  ? in situ-left upper outer forehead-medial (CX35FU)  ? Squamous cell carcinoma of skin 03/14/2008  ? in situ-crown of scalp (Cx35FU)  ? Squamous  cell carcinoma of skin 04/15/2011  ? in situ-right dorsal forearm (txpbx)  ? Squamous cell carcinoma of skin 10/16/2011  ? in situ-left sideburn  ? Squamous cell carcinoma of skin 03/11/2015  ? ka-left sideburn (CX35FU)  ? Squamous cell carcinoma of skin 03/11/2015  ? ka-left forearm (CX35FU)  ? Squamous cell carcinoma of skin 06/22/2018  ? in situ-left forearm, sup (txpbx)  ? Squamous cell carcinoma of skin 05/01/2019  ? in situ-mid anterior scalp   ? Squamous cell carcinoma of skin 05/01/2019  ? in situ-right upper arm  ? ?Past Surgical History:  ?Procedure Laterality Date  ? ATRIAL FIBRILLATION ABLATION N/A 04/17/2020  ? Procedure: ATRIAL FIBRILLATION ABLATION;  Surgeon: Vickie Epley, MD;  Location: [place] [facility];  Service: Cardiovascular;  Laterality: N/A;  ? AXILLARY SENTINEL NODE BIOPSY Right 08/23/2019  ? Procedure: SENTINEL LYMPH NODE BIOSPY RIGHT AXILLA;  Surgeon: Stark Klein, MD;  Location: [place];  Service: General;  Laterality: Right;  ? CARDIOVERSION N/A 05/10/2020  ? Procedure: CARDIOVERSION;  Surgeon: Freada Bergeron, MD;  Location: [facility];  Service: Cardiovascular;  Laterality: N/A;  ? CARDIOVERSION N/A 05/08/2021  ? Procedure: CARDIOVERSION;  Surgeon: Donato Heinz, MD;  Location: [place];  Service: Cardiovascular;  Laterality: N/A;  ? COLONOSCOPY  2002  ? FASCIECTOMY Left 03/28/2019  ? Procedure: SEGMENTAL FASCIECTOMY LEFT RING FINGER;  Surgeon: Daryll Brod, MD;  Location: [place];  Service: Orthopedics;  Laterality: Left;  ANESTHESIA  AXILLARY BLOCK  ? INSERTION PROSTATE RADIATION SEED  8 4 2010  ? per Dr Rosana Hoes  ? MELANOMA EXCISION Right 06/22/2019  ? Procedure: WIDE LOCAL EXCISION RIGHT CHEST WALL MELANOMA, ADVANCEMENT FLAP CLOSURE FOR DEFECT 3X6 CM;  Surgeon: Stark Klein, MD;  Location: [place];  Service: General;  Laterality: Right;  ? POLYPECTOMY  2002  ? THROAT SURGERY  1980  ? benign cyst  ? UPPER  GASTROINTESTINAL ENDOSCOPY    ? URETHRAL STRICTURE DILATATION    ? also penile implant  ? ?Patient Active Problem List  ? Diagnosis Date Noted  ? Arthritis of hand 09/02/2021  ? Olecranon bursitis 08/18/2021  ? Pain in joint of right elbow 08/14/2021  ? Imbalance 08/06/2021  ? Pain in joint of right hip 07/22/2021  ? Low back pain 07/22/2021  ? Fracture of sacrum ([place]) 07/14/2021  ? Orthostatic hypotension 05/02/2021  ? Melanoma ([place])   ? Atrial fibrillation ([place])   ? Malignant neoplasm of prostate ([place]) 07/15/2015  ? Recurrent nephrolithiasis 07/15/2015  ? Personal history of prostate cancer 04/25/2015  ? 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/09/2013  ? 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 Coronary artery disease 11/02/2011  ? Urethral stricture 06/22/2011  ? Hyperlipidemia   ? Borderline diabetic   ? Diabetes mellitus with coincident hypertension ([place])   ? ? ?ONSET DATE: 08/06/2021  ? ?REFERRING DIAG: Diagnosis M16.11 (ICD-10-CM) - Primary osteoarthritis of right hip R26.81 (ICD-10-CM) - Unsteadiness on feet R26.89 (ICD-10-CM) - Balance problem M54.50 (ICD-10-CM) - Low back pain at multiple sites  ? ?THERAPY DIAG:  ?Unsteadiness on feet ? ?Muscle weakness (generalized) ? ?Other abnormalities of gait and mobility ? ?SUBJECTIVE:  ? ?SUBJECTIVE Pt reports had a fall, tripped over trailer hitch on Thursday last week and fell onto the hands. Reports went ot MD and did an x-ray with no findings. Believe he just pulled a muscle at the [place].                                                                                      ? ?PERTINENT HISTORY: Ataxia, Arthritis, Back Pain, DM Type 2, GERD, HLD, HTN, Melanoma, Prostate CA, Squamous cell carcinoma of skin ? ?PAIN:  ?Are you having pain? Yes ?VAS scale: 3/10, Generalized, Soreness ? ?PRECAUTIONS: Fall ? ?WEIGHT BEARING RESTRICTIONS No ? ?LIVING ENVIRONMENT: ?Lives with: lives alone ?Lives in: House/apartment ?Stairs: Yes; Internal: 2 steps; on right going up and  External: 3 steps; on right going up (reports primarily comes in through garage, no rails) ?Has following equipment at home: Quad cane small base ? ?PLOF: Independent ? ?PATIENT GOALS: To improve my balance and prevent further falls; Get back to walking at the [facility] (2-3 miles, 3x/week)  ? ?OBJECTIVE: ? ?PT Treatment: ? ? [facility] PT Assessment - 10/27/21 0001   ? ?  ? Functional Gait  Assessment  ? Gait assessed  Yes   ?  Gait Level Surface Walks 20 ft in less than 7 sec but greater than 5.5 sec, uses assistive device, slower speed, mild gait deviations, or deviates 6-10 in outside of the 12 in walkway width.   ? Change in Gait Speed Able to smoothly change walking speed without loss of balance or gait deviation. Deviate no more than 6 in outside of the 12 in walkway width.   ? Gait with Horizontal Head Turns Performs head turns smoothly with no change in gait. Deviates no more than 6 in outside 12 in walkway width   ? Gait with Vertical Head Turns Performs head turns with no change in gait. Deviates no more than 6 in outside 12 in walkway width.   ? Gait and Pivot Turn Pivot turns safely within 3 sec and stops quickly with no loss of balance.   ? Step Over Obstacle Is able to step over one shoe box (4.5 in total height) without changing gait speed. No evidence of imbalance.   ? Gait with Narrow Base of Support Ambulates 4-7 steps.   ? Gait with Eyes Closed Walks 20 ft, slow speed, abnormal gait pattern, evidence for imbalance, deviates 10-15 in outside 12 in walkway width. Requires more than 9 sec to ambulate 20 ft.   ? Ambulating Backwards Walks 20 ft, no assistive devices, good speed, no evidence for imbalance, normal gait   ? Steps Alternating feet, must use rail.   ? Total Score 23   ? FGA comment: 23/30   ? ?  ?  ? ?  ? ? ?NMR: ?M-CTSIB: able to hold situation 1-3 for full 30 seconds, situation 4 for 18 seconds. Still demo increased challenge on complaint surface.  ?TUG:  10.90 secs without AD, no unsteadiness  noted.  ?5x Sit to Stand Test: 14.25 seconds, decreased control on last rep noted. Completed from mat at standard height with BUE support.   ? ? ?Completed entire review of HEP and provided new handout:  ?Ac

## 2021-10-30 ENCOUNTER — Other Ambulatory Visit: Payer: Self-pay

## 2021-10-30 ENCOUNTER — Ambulatory Visit (INDEPENDENT_AMBULATORY_CARE_PROVIDER_SITE_OTHER): Payer: Medicare Other

## 2021-10-30 ENCOUNTER — Other Ambulatory Visit: Payer: Medicare Other

## 2021-10-30 VITALS — Ht 69.0 in | Wt 173.0 lb

## 2021-10-30 DIAGNOSIS — R300 Dysuria: Secondary | ICD-10-CM

## 2021-10-30 DIAGNOSIS — D649 Anemia, unspecified: Secondary | ICD-10-CM

## 2021-10-30 DIAGNOSIS — I10 Essential (primary) hypertension: Secondary | ICD-10-CM

## 2021-10-30 DIAGNOSIS — E1169 Type 2 diabetes mellitus with other specified complication: Secondary | ICD-10-CM

## 2021-10-30 DIAGNOSIS — E7800 Pure hypercholesterolemia, unspecified: Secondary | ICD-10-CM

## 2021-10-30 DIAGNOSIS — Z0000 Encounter for general adult medical examination without abnormal findings: Secondary | ICD-10-CM

## 2021-10-30 DIAGNOSIS — R634 Abnormal weight loss: Secondary | ICD-10-CM

## 2021-10-30 NOTE — Patient Instructions (Signed)
Mr. Karge , ?Thank you for taking time to come for your Medicare Wellness Visit. I appreciate your ongoing commitment to your health goals. Please review the following plan we discussed and let me know if I can assist you in the future.  ? ?Screening recommendations/referrals: ?Colonoscopy: Done 10/11/2020. No longer required.  ? ?Recommended yearly ophthalmology/optometry visit for glaucoma screening and checkup ?Recommended yearly dental visit for hygiene and checkup ? ?Vaccinations: ?Influenza vaccine: Due Repeat annually ? ?Pneumococcal vaccine: Done 07/08/2015, 01/20/2016 ?Tdap vaccine: Done 06/11/2011 Repeat in 10 years ? ?Shingles vaccine: Done 09/30/2017 and 07/09/2017.    ?Covid-19: Done 05/28/2020, 09/21/2019, 09/01/2019. ? ?Advanced directives: Please bring a copy of your health care power of attorney and living will to the office to be added to your chart at your convenience. ? ? ?Conditions/risks identified: Aim for 30 minutes of exercise or brisk walking, 6-8 glasses of water, and 5 servings of fruits and vegetables each day. ? ? ?Next appointment: Follow up in one year for your annual wellness visit. 2024. ? ?Preventive Care 65 Years and Older, Male ? ?Preventive care refers to lifestyle choices and visits with your health care provider that can promote health and wellness. ?What does preventive care include? ?A yearly physical exam. This is also called an annual well check. ?Dental exams once or twice a year. ?Routine eye exams. Ask your health care provider how often you should have your eyes checked. ?Personal lifestyle choices, including: ?Daily care of your teeth and gums. ?Regular physical activity. ?Eating a healthy diet. ?Avoiding tobacco and drug use. ?Limiting alcohol use. ?Practicing safe sex. ?Taking low doses of aspirin every day. ?Taking vitamin and mineral supplements as recommended by your health care provider. ?What happens during an annual well check? ?The services and screenings done by  your health care provider during your annual well check will depend on your age, overall health, lifestyle risk factors, and family history of disease. ?Counseling  ?Your health care provider may ask you questions about your: ?Alcohol use. ?Tobacco use. ?Drug use. ?Emotional well-being. ?Home and relationship well-being. ?Sexual activity. ?Eating habits. ?History of falls. ?Memory and ability to understand (cognition). ?Work and work Statistician. ?Screening  ?You may have the following tests or measurements: ?Height, weight, and BMI. ?Blood pressure. ?Lipid and cholesterol levels. These may be checked every 5 years, or more frequently if you are over 40 years old. ?Skin check. ?Lung cancer screening. You may have this screening every year starting at age 60 if you have a 30-pack-year history of smoking and currently smoke or have quit within the past 15 years. ?Fecal occult blood test (FOBT) of the stool. You may have this test every year starting at age 33. ?Flexible sigmoidoscopy or colonoscopy. You may have a sigmoidoscopy every 5 years or a colonoscopy every 10 years starting at age 47. ?Prostate cancer screening. Recommendations will vary depending on your family history and other risks. ?Hepatitis C blood test. ?Hepatitis B blood test. ?Sexually transmitted disease (STD) testing. ?Diabetes screening. This is done by checking your blood sugar (glucose) after you have not eaten for a while (fasting). You may have this done every 1-3 years. ?Abdominal aortic aneurysm (AAA) screening. You may need this if you are a current or former smoker. ?Osteoporosis. You may be screened starting at age 71 if you are at high risk. ?Talk with your health care provider about your test results, treatment options, and if necessary, the need for more tests. ?Vaccines  ?Your health care provider may  recommend certain vaccines, such as: ?Influenza vaccine. This is recommended every year. ?Tetanus, diphtheria, and acellular pertussis  (Tdap, Td) vaccine. You may need a Td booster every 10 years. ?Zoster vaccine. You may need this after age 54. ?Pneumococcal 13-valent conjugate (PCV13) vaccine. One dose is recommended after age 35. ?Pneumococcal polysaccharide (PPSV23) vaccine. One dose is recommended after age 14. ?Talk to your health care provider about which screenings and vaccines you need and how often you need them. ?This information is not intended to replace advice given to you by your health care provider. Make sure you discuss any questions you have with your health care provider. ?Document Released: 08/23/2015 Document Revised: 04/15/2016 Document Reviewed: 05/28/2015 ?Elsevier Interactive Patient Education ? 2017 [place]. ? ?Fall Prevention in the Home ?Falls can cause injuries. They can happen to people of all ages. There are many things you can do to make your home safe and to help prevent falls. ?What can I do on the outside of my home? ?Regularly fix the edges of walkways and driveways and fix any cracks. ?Remove anything that might make you trip as you walk through a door, such as a raised step or threshold. ?Trim any bushes or trees on the path to your home. ?Use bright outdoor lighting. ?Clear any walking paths of anything that might make someone trip, such as rocks or tools. ?Regularly check to see if handrails are loose or broken. Make sure that both sides of any steps have handrails. ?Any raised decks and porches should have guardrails on the edges. ?Have any leaves, snow, or ice cleared regularly. ?Use sand or salt on walking paths during winter. ?Clean up any spills in your garage right away. This includes oil or grease spills. ?What can I do in the bathroom? ?Use night lights. ?Install grab bars by the toilet and in the tub and shower. Do not use towel bars as grab bars. ?Use non-skid mats or decals in the tub or shower. ?If you need to sit down in the shower, use a plastic, non-slip stool. ?Keep the floor dry. Clean  up any water that spills on the floor as soon as it happens. ?Remove soap buildup in the tub or shower regularly. ?Attach bath mats securely with double-sided non-slip rug tape. ?Do not have throw rugs and other things on the floor that can make you trip. ?What can I do in the bedroom? ?Use night lights. ?Make sure that you have a light by your bed that is easy to reach. ?Do not use any sheets or blankets that are too big for your bed. They should not hang down onto the floor. ?Have a firm chair that has side arms. You can use this for support while you get dressed. ?Do not have throw rugs and other things on the floor that can make you trip. ?What can I do in the kitchen? ?Clean up any spills right away. ?Avoid walking on wet floors. ?Keep items that you use a lot in easy-to-reach places. ?If you need to reach something above you, use a strong step stool that has a grab bar. ?Keep electrical cords out of the way. ?Do not use floor polish or wax that makes floors slippery. If you must use wax, use non-skid floor wax. ?Do not have throw rugs and other things on the floor that can make you trip. ?What can I do with my stairs? ?Do not leave any items on the stairs. ?Make sure that there are handrails on both sides of  the stairs and use them. Fix handrails that are broken or loose. Make sure that handrails are as long as the stairways. ?Check any carpeting to make sure that it is firmly attached to the stairs. Fix any carpet that is loose or worn. ?Avoid having throw rugs at the top or bottom of the stairs. If you do have throw rugs, attach them to the floor with carpet tape. ?Make sure that you have a light switch at the top of the stairs and the bottom of the stairs. If you do not have them, ask someone to add them for you. ?What else can I do to help prevent falls? ?Wear shoes that: ?Do not have high heels. ?Have rubber bottoms. ?Are comfortable and fit you well. ?Are closed at the toe. Do not wear sandals. ?If you  use a stepladder: ?Make sure that it is fully opened. Do not climb a closed stepladder. ?Make sure that both sides of the stepladder are locked into place. ?Ask someone to hold it for you, if possible. ?

## 2021-10-30 NOTE — Progress Notes (Signed)
Fasting labs for AWV. ?

## 2021-10-30 NOTE — Progress Notes (Signed)
? ?Subjective:  ? Antonio Clark is a 86 y.o. male who presents for Medicare Annual/Subsequent preventive examination. ? ?Review of Systems    ? ?Cardiac Risk Factors include: advanced age (>25mn, >>64women);diabetes mellitus;dyslipidemia;male gender;sedentary lifestyle ? ?   ?Objective:  ?  ?Today's Vitals  ? 10/30/21 1122  ?Weight: 173 lb (78.5 kg)  ?Height: '5\' 9"'$  (1.753 m)  ? ?Body mass index is 25.55 kg/m?. ? ? ?  10/30/2021  ? 11:32 AM 08/19/2021  ?  3:46 PM 07/03/2021  ?  8:06 AM 05/08/2021  ?  1:01 PM 06/07/2020  ?  9:32 AM 05/10/2020  ? 11:18 AM 04/17/2020  ?  6:53 AM  ?Advanced Directives  ?Does Patient Have a Medical Advance Directive? No Yes No No Yes No No  ?Type of AProduction managerof AMuscatineLiving will;Out of facility DNR (pink MOST or yellow form)    ?Does patient want to make changes to medical advance directive?  No - Patient declined       ?Would patient like information on creating a medical advance directive? No - Patient declined   Yes (MAU/Ambulatory/Procedural Areas - Information given)  No - Patient declined No - Patient declined  ? ? ?Current Medications (verified) ?Outpatient Encounter Medications as of 10/30/2021  ?Medication Sig  ? atorvastatin (LIPITOR) 40 MG tablet TAKE 1 TABLET (40 MG TOTAL) BY MOUTH DAILY. PLEASE STOP PRAVASTATIN  ? cetirizine (ZYRTEC) 10 MG tablet Take 10 mg by mouth daily as needed for allergies.  ? finasteride (PROSCAR) 5 MG tablet Take 5 mg by mouth daily.  ? fluticasone (FLONASE) 50 MCG/ACT nasal spray Place 1 spray into both nostrils daily as needed for allergies or rhinitis.  ? linagliptin (TRADJENTA) 5 MG TABS tablet Take 1 tablet (5 mg total) by mouth daily.  ? LORazepam (ATIVAN) 0.5 MG tablet TAKE 1 TABLET BY MOUTH EVERY DAY AS NEEDED FOR ANXIETY  ? metoprolol tartrate (LOPRESSOR) 25 MG tablet Take 1 tablet (25 mg total) by mouth 2 (two) times daily.  ? PRADAXA 150 MG CAPS capsule TAKE 1 CAPSULE BY MOUTH TWICE A DAY  ? ?No  facility-administered encounter medications on file as of 10/30/2021.  ? ? ?Allergies (verified) ?Patient has no known allergies.  ? ?History: ?Past Medical History:  ?Diagnosis Date  ? Anal fissure   ? Arthritis   ? Atrial fibrillation ([place]   ? Back pain   ? Colon polyps   ? Diabetes mellitus type 2 with complications ([place]   ? Dysrhythmia   ? a-fib  ? GERD (gastroesophageal reflux disease)   ? History of echocardiogram 07/ 07/ 2011  ? History of lithotripsy 1989  ? Hyperlipidemia   ? Hypertension   ? Kidney stones   ? Melanoma ([place] 05/01/2019  ? right chest wall (12/20)  ? Prostate CA ([facility]  ? Sleep apnea   ? uses CPAP nightly  ? Squamous cell carcinoma of skin 05/23/1992  ? bowens-left parietal scalp (CX35FU)  ? Squamous cell carcinoma of skin 03/14/2008  ? in situ-left upper outer forehead-medial (CX35FU)  ? Squamous cell carcinoma of skin 03/14/2008  ? in situ-crown of scalp (Cx35FU)  ? Squamous cell carcinoma of skin 04/15/2011  ? in situ-right dorsal forearm (txpbx)  ? Squamous cell carcinoma of skin 10/16/2011  ? in situ-left sideburn  ? Squamous cell carcinoma of skin 03/11/2015  ? ka-left sideburn (CX35FU)  ? Squamous cell carcinoma of skin 03/11/2015  ? ka-left forearm (CX35FU)  ?  Squamous cell carcinoma of skin 06/22/2018  ? in situ-left forearm, sup (txpbx)  ? Squamous cell carcinoma of skin 05/01/2019  ? in situ-mid anterior scalp   ? Squamous cell carcinoma of skin 05/01/2019  ? in situ-right upper arm  ? ?Past Surgical History:  ?Procedure Laterality Date  ? ATRIAL FIBRILLATION ABLATION N/A 04/17/2020  ? Procedure: ATRIAL FIBRILLATION ABLATION;  Surgeon: Vickie Epley, MD;  Location: [place] [facility];  Service: Cardiovascular;  Laterality: N/A;  ? AXILLARY SENTINEL NODE BIOPSY Right 08/23/2019  ? Procedure: SENTINEL LYMPH NODE BIOSPY RIGHT AXILLA;  Surgeon: Stark Klein, MD;  Location: [place];  Service: General;  Laterality: Right;  ? CARDIOVERSION N/A 05/10/2020  ?  Procedure: CARDIOVERSION;  Surgeon: Freada Bergeron, MD;  Location: [facility] ENDOSCOPY;  Service: Cardiovascular;  Laterality: N/A;  ? CARDIOVERSION N/A 05/08/2021  ? Procedure: CARDIOVERSION;  Surgeon: Donato Heinz, MD;  Location: [facility] ENDOSCOPY;  Service: Cardiovascular;  Laterality: N/A;  ? COLONOSCOPY  2002  ? FASCIECTOMY Left 03/28/2019  ? Procedure: SEGMENTAL FASCIECTOMY LEFT RING FINGER;  Surgeon: Daryll Brod, MD;  Location: [place];  Service: Orthopedics;  Laterality: Left;  ANESTHESIA  AXILLARY BLOCK  ? INSERTION PROSTATE RADIATION SEED  8 4 2010  ? per Dr Rosana Hoes  ? MELANOMA EXCISION Right 06/22/2019  ? Procedure: WIDE LOCAL EXCISION RIGHT CHEST WALL MELANOMA, ADVANCEMENT FLAP CLOSURE FOR DEFECT 3X6 CM;  Surgeon: Stark Klein, MD;  Location: [place];  Service: General;  Laterality: Right;  ? POLYPECTOMY  2002  ? THROAT SURGERY  1980  ? benign cyst  ? UPPER GASTROINTESTINAL ENDOSCOPY    ? URETHRAL STRICTURE DILATATION    ? also penile implant  ? ?Family History  ?Problem Relation Age of Onset  ? Coronary artery disease Brother   ?     3 brothers had CABG  ? Arrhythmia Brother   ? Diabetes Brother   ? Hearing loss Brother   ? Hypertension Brother   ? Heart disease Brother   ? Heart failure Brother   ? Hypertension Brother   ? Heart disease Father   ? Hypertension Father   ? Sudden death Father   ? Stroke Mother   ?     cerebral hemorrage  ? Colon cancer Mother 81  ?     small intestine  ? Depression Mother   ? Stroke Sister   ? Suicidality Sister   ? Diabetes Brother   ? Hypertension Brother   ? Hyperlipidemia Sister   ? Esophageal cancer Neg Hx   ? Pancreatic cancer Neg Hx   ? Prostate cancer Neg Hx   ? Rectal cancer Neg Hx   ? Stomach cancer Neg Hx   ? ?Social History  ? ?Socioeconomic History  ? Marital status: Single  ?  Spouse name: Not on file  ? Number of children: 0  ? Years of education: Not on file  ? Highest education level: Not on file  ?Occupational  History  ? Occupation: retired  ?Tobacco Use  ? Smoking status: Never  ? Smokeless tobacco: Never  ?Vaping Use  ? Vaping Use: Never used  ?Substance and Sexual Activity  ? Alcohol use: Not Currently  ?  Comment: social  ? Drug use: No  ? Sexual activity: Yes  ?Other Topics Concern  ? Not on file  ?Social History Narrative  ? Not on file  ? ?Social Determinants of Health  ? ?Financial Resource Strain: Low Risk   ?  Difficulty of Paying Living Expenses: Not hard at all  ?Food Insecurity: No Food Insecurity  ? Worried About Charity fundraiser in the Last Year: Never true  ? Ran Out of Food in the Last Year: Never true  ?Transportation Needs: No Transportation Needs  ? Lack of Transportation (Medical): No  ? Lack of Transportation (Non-Medical): No  ?Physical Activity: Inactive  ? Days of Exercise per Week: 0 days  ? Minutes of Exercise per Session: 0 min  ?Stress: No Stress Concern Present  ? Feeling of Stress : Not at all  ?Social Connections: Moderately Integrated  ? Frequency of Communication with Friends and Family: More than three times a week  ? Frequency of Social Gatherings with Friends and Family: More than three times a week  ? Attends Religious Services: More than 4 times per year  ? Active Member of Clubs or Organizations: Yes  ? Attends Archivist Meetings: More than 4 times per year  ? Marital Status: Never married  ? ? ?Tobacco Counseling ?Counseling given: Not Answered ? ? ?Clinical Intake: ? ?Pre-visit preparation completed: Yes ? ?Pain : No/denies pain ? ?  ? ?BMI - recorded: 25.55 ?Nutritional Status: BMI 25 -29 Overweight ?Nutritional Risks: None ?Diabetes: Yes ? ?How often do you need to have someone help you when you read instructions, pamphlets, or other written materials from your doctor or pharmacy?: 1 - Never ? ?Diabetic?Nutrition Risk Assessment: ? ?Has the patient had any N/V/D within the last 2 months?  No  ?Does the patient have any non-healing wounds?  No  ?Has the patient had  any unintentional weight loss or weight gain?  No  ? ?Diabetes: ? ?Is the patient diabetic?  Yes  ?If diabetic, was a CBG obtained today?  No  ?Did the patient bring in their glucometer from home?  No  ?How often do you monitor

## 2021-10-30 NOTE — Addendum Note (Signed)
Addended by: Randal Buba K on: 10/30/2021 11:57 AM ? ? Modules accepted: Orders ? ?

## 2021-10-31 ENCOUNTER — Other Ambulatory Visit: Payer: Self-pay | Admitting: Family Medicine

## 2021-10-31 LAB — LIPID PANEL
Cholesterol: 103 mg/dL (ref <200)
HDL: 48 mg/dL (ref >=40)
LDL Cholesterol (Calc): 38 mg/dL (calc)
Non-HDL Cholesterol (Calc): 55 mg/dL (calc) (ref <130)
Total CHOL/HDL Ratio: 2.1 (calc) (ref <5.0)
Triglycerides: 83 mg/dL (ref <150)

## 2021-10-31 LAB — CBC WITH DIFFERENTIAL/PLATELET
Absolute Monocytes: 611 cells/uL (ref 200–950)
Basophils Absolute: 43 cells/uL (ref 0–200)
Basophils Relative: 0.6 %
Eosinophils Absolute: 128 cells/uL (ref 15–500)
Eosinophils Relative: 1.8 %
HCT: 40.6 % (ref 38.5–50.0)
Hemoglobin: 13.4 g/dL (ref 13.2–17.1)
Lymphs Abs: 1647 cells/uL (ref 850–3900)
MCH: 29.5 pg (ref 27.0–33.0)
MCHC: 33 g/dL (ref 32.0–36.0)
MCV: 89.2 fL (ref 80.0–100.0)
MPV: 10 fL (ref 7.5–12.5)
Monocytes Relative: 8.6 %
Neutro Abs: 4672 cells/uL (ref 1500–7800)
Neutrophils Relative %: 65.8 %
Platelets: 148 10*3/uL (ref 140–400)
RBC: 4.55 10*6/uL (ref 4.20–5.80)
RDW: 13.3 % (ref 11.0–15.0)
Total Lymphocyte: 23.2 %
WBC: 7.1 10*3/uL (ref 3.8–10.8)

## 2021-10-31 LAB — COMPREHENSIVE METABOLIC PANEL
AG Ratio: 1.9 (calc) (ref 1.0–2.5)
ALT: 7 U/L — ABNORMAL LOW (ref 9–46)
AST: 11 U/L (ref 10–35)
Albumin: 4.3 g/dL (ref 3.6–5.1)
Alkaline phosphatase (APISO): 79 U/L (ref 35–144)
BUN: 14 mg/dL (ref 7–25)
CO2: 25 mmol/L (ref 20–32)
Calcium: 9.4 mg/dL (ref 8.6–10.3)
Chloride: 107 mmol/L (ref 98–110)
Creat: 0.96 mg/dL (ref 0.70–1.22)
Globulin: 2.3 g/dL (calc) (ref 1.9–3.7)
Glucose, Bld: 131 mg/dL — ABNORMAL HIGH (ref 65–99)
Potassium: 4.8 mmol/L (ref 3.5–5.3)
Sodium: 142 mmol/L (ref 135–146)
Total Bilirubin: 0.6 mg/dL (ref 0.2–1.2)
Total Protein: 6.6 g/dL (ref 6.1–8.1)

## 2021-10-31 LAB — URINALYSIS, ROUTINE W REFLEX MICROSCOPIC
Bilirubin Urine: NEGATIVE
Glucose, UA: NEGATIVE
Ketones, ur: NEGATIVE
Nitrite: POSITIVE — AB
Specific Gravity, Urine: 1.02 (ref 1.001–1.035)
Squamous Epithelial / HPF: NONE SEEN /HPF (ref <=5)
WBC, UA: >=60 /HPF — AB (ref 0–5)
pH: <=5 (ref 5.0–8.0)

## 2021-10-31 LAB — HEMOGLOBIN A1C
Hgb A1c MFr Bld: 6.3 % of total Hgb — ABNORMAL HIGH (ref <5.7)
Mean Plasma Glucose: 134 mg/dL
eAG (mmol/L): 7.4 mmol/L

## 2021-10-31 LAB — MICROSCOPIC MESSAGE

## 2021-10-31 MED ORDER — SULFAMETHOXAZOLE-TRIMETHOPRIM 800-160 MG PO TABS: 1.0000 | ORAL_TABLET | Freq: Two times a day (BID) | ORAL | 0 refills | Status: DC

## 2021-11-11 ENCOUNTER — Ambulatory Visit: Payer: Medicare Other | Admitting: Cardiology

## 2021-11-11 NOTE — Progress Notes (Incomplete)
?Cardiology Office Note:   ? ?Date:  11/11/2021  ? ?ID:  Antonio Clark, DOB [birth year], MRN [id number] ? ?PCP:  Susy Frizzle, MD ?  ?[facility] Providers ?Cardiologist:  Candee Furbish, MD ?Electrophysiologist:  Vickie Epley, MD    ? ?Referring MD: Susy Frizzle, MD  ? ? ?History of Present Illness:   ? ?Antonio Clark is a 86 y.o. male here for the follow-up of CAD, HLD, and Atrial Fibrillation. ? ?He presented to the ED 07/03/2021 after a fall due to an episode of dizziness. This occurred while he was going to bed. ? ?Atrial fibrillation s/p prior ablation April 17, 2020.  No recurrence of arrhythmia.  On Pradaxa for stroke prophylaxis.   ? ? Interestingly, in review of his records here he had an MRI of his brain on 04/24/2021 that showed global volume loss that is advanced with severe chronic small vessel disease with slightly disproportionate atrophy of the cerebellum.  This was ordered by Dr. Doren Custard secondary to ataxia unspecified. ? ?And also seeing a visit on 03/07/2021 about 2 months ago with Noemi Chapel, NP for balance problems. ? ? ?At his last appointment, he reported several falls.  He felt some pulmonary congestion, coughing.  Has felt unstable, unbalanced.  No recent change in his hearing eyesight or proprioception.  Mostly happens in the morning, feels unsteady.  He has not been going to the [facility] and he does feel like his legs may be weaker. Showed signs of orthostatics that were positive.  133/80 laying down after standing for 3 minutes 108/66.  Heart rate remained fairly steady ranging from 77-86. He also showed return of atrial fibrillation.  He saw Dr. Quentin Ore back in June 2022. ? ?Today, ?Overall, ? ? ?He denies any palpitations, chest pain, shortness of breath, or peripheral edema. No lightheadedness, headaches, syncope, orthopnea, or PND.  ? ?Past Medical History:  ?Diagnosis Date  ? Anal fissure   ? Arthritis   ? Atrial fibrillation ([place])   ? Back pain   ? Colon polyps   ?  Diabetes mellitus type 2 with complications ([place])   ? Dysrhythmia   ? a-fib  ? GERD (gastroesophageal reflux disease)   ? History of echocardiogram 07/ 07/ 2011  ? History of lithotripsy 1989  ? Hyperlipidemia   ? Hypertension   ? Kidney stones   ? Melanoma ([place]) 05/01/2019  ? right chest wall (12/20)  ? Prostate CA [facility]) 2010  ? Sleep apnea   ? uses CPAP nightly  ? Squamous cell carcinoma of skin 05/23/1992  ? bowens-left parietal scalp (CX35FU)  ? Squamous cell carcinoma of skin 03/14/2008  ? in situ-left upper outer forehead-medial (CX35FU)  ? Squamous cell carcinoma of skin 03/14/2008  ? in situ-crown of scalp (Cx35FU)  ? Squamous cell carcinoma of skin 04/15/2011  ? in situ-right dorsal forearm (txpbx)  ? Squamous cell carcinoma of skin 10/16/2011  ? in situ-left sideburn  ? Squamous cell carcinoma of skin 03/11/2015  ? ka-left sideburn (CX35FU)  ? Squamous cell carcinoma of skin 03/11/2015  ? ka-left forearm (CX35FU)  ? Squamous cell carcinoma of skin 06/22/2018  ? in situ-left forearm, sup (txpbx)  ? Squamous cell carcinoma of skin 05/01/2019  ? in situ-mid anterior scalp   ? Squamous cell carcinoma of skin 05/01/2019  ? in situ-right upper arm  ? ? ?Past Surgical History:  ?Procedure Laterality Date  ? ATRIAL FIBRILLATION ABLATION N/A 04/17/2020  ? Procedure: ATRIAL FIBRILLATION ABLATION;  Surgeon: Vickie Epley, MD;  Location: [place] [facility];  Service: Cardiovascular;  Laterality: N/A;  ? AXILLARY SENTINEL NODE BIOPSY Right 08/23/2019  ? Procedure: SENTINEL LYMPH NODE BIOSPY RIGHT AXILLA;  Surgeon: Stark Klein, MD;  Location: [place];  Service: General;  Laterality: Right;  ? CARDIOVERSION N/A 05/10/2020  ? Procedure: CARDIOVERSION;  Surgeon: Freada Bergeron, MD;  Location: [facility] ENDOSCOPY;  Service: Cardiovascular;  Laterality: N/A;  ? CARDIOVERSION N/A 05/08/2021  ? Procedure: CARDIOVERSION;  Surgeon: Donato Heinz, MD;  Location: [facility] ENDOSCOPY;  Service: Cardiovascular;   Laterality: N/A;  ? COLONOSCOPY  2002  ? FASCIECTOMY Left 03/28/2019  ? Procedure: SEGMENTAL FASCIECTOMY LEFT RING FINGER;  Surgeon: Daryll Brod, MD;  Location: [place];  Service: Orthopedics;  Laterality: Left;  ANESTHESIA  AXILLARY BLOCK  ? INSERTION PROSTATE RADIATION SEED  8 4 2010  ? per Dr Rosana Hoes  ? MELANOMA EXCISION Right 06/22/2019  ? Procedure: WIDE LOCAL EXCISION RIGHT CHEST WALL MELANOMA, ADVANCEMENT FLAP CLOSURE FOR DEFECT 3X6 CM;  Surgeon: Stark Klein, MD;  Location: [place];  Service: General;  Laterality: Right;  ? POLYPECTOMY  2002  ? THROAT SURGERY  1980  ? benign cyst  ? UPPER GASTROINTESTINAL ENDOSCOPY    ? URETHRAL STRICTURE DILATATION    ? also penile implant  ? ? ?Current Medications: ?No outpatient medications have been marked as taking for the 11/11/21 encounter (Appointment) with Jerline Pain, MD.  ?  ? ?Allergies:   Patient has no known allergies.  ? ?Social History  ? ?Socioeconomic History  ? Marital status: Single  ?  Spouse name: Not on file  ? Number of children: 0  ? Years of education: Not on file  ? Highest education level: Not on file  ?Occupational History  ? Occupation: retired  ?Tobacco Use  ? Smoking status: Never  ? Smokeless tobacco: Never  ?Vaping Use  ? Vaping Use: Never used  ?Substance and Sexual Activity  ? Alcohol use: Not Currently  ?  Comment: social  ? Drug use: No  ? Sexual activity: Yes  ?Other Topics Concern  ? Not on file  ?Social History Narrative  ? Not on file  ? ?Social Determinants of Health  ? ?Financial Resource Strain: Low Risk   ? Difficulty of Paying Living Expenses: Not hard at all  ?Food Insecurity: No Food Insecurity  ? Worried About Charity fundraiser in the Last Year: Never true  ? Ran Out of Food in the Last Year: Never true  ?Transportation Needs: No Transportation Needs  ? Lack of Transportation (Medical): No  ? Lack of Transportation (Non-Medical): No  ?Physical Activity: Inactive  ? Days of Exercise per  Week: 0 days  ? Minutes of Exercise per Session: 0 min  ?Stress: No Stress Concern Present  ? Feeling of Stress : Not at all  ?Social Connections: Moderately Integrated  ? Frequency of Communication with Friends and Family: More than three times a week  ? Frequency of Social Gatherings with Friends and Family: More than three times a week  ? Attends Religious Services: More than 4 times per year  ? Active Member of Clubs or Organizations: Yes  ? Attends Archivist Meetings: More than 4 times per year  ? Marital Status: Never married  ?  ? ?Family History: ?The patient's family history includes Arrhythmia in his brother; Colon cancer (age of onset: 19) in his mother; Coronary artery disease in  his brother; Depression in his mother; Diabetes in his brother and brother; Hearing loss in his brother; Heart disease in his brother and father; Heart failure in his brother; Hyperlipidemia in his sister; Hypertension in his brother, brother, brother, and father; Stroke in his mother and sister; Sudden death in his father; Suicidality in his sister. There is no history of Esophageal cancer, Pancreatic cancer, Prostate cancer, Rectal cancer, or Stomach cancer. ? ?ROS:   ?Please see the history of present illness. ?(+)    ? All other systems reviewed and are negative. ? ?EKGs/Labs/Other Studies Reviewed:   ? ?The following studies were reviewed today: ? ?Atrial Fibrillation Ablation 04/17/2020: ?CONCLUSIONS: ?1. Successful PVI ?2. Successful ablation/isolation of the posterior wall ?3. Successful ablation of the CTI ?4. No early apparent complications. ? ?Echo 04/10/2020: ?IMPRESSIONS  ? ? 1. Inferior basal hypokinesis. Left ventricular ejection fraction, by  ?estimation, is 50 to 55%. The left ventricle has low normal function. The  ?left ventricle demonstrates regional wall motion abnormalities (see  ?scoring diagram/findings for  ?description). Left ventricular diastolic parameters are indeterminate.  ? 2. Right  ventricular systolic function is normal. The right ventricular  ?size is normal. There is normal pulmonary artery systolic pressure.  ? 3. Left atrial size was mildly dilated.  ? 4. The mitral valve is normal in

## 2021-11-17 ENCOUNTER — Telehealth: Payer: Self-pay | Admitting: Pharmacist

## 2021-11-17 NOTE — Progress Notes (Addendum)
? ? ?Chronic Care Management ?Pharmacy Assistant  ? ?Name: Antonio Clark  MRN: [id number] DOB: October 28, 1935 ? ? ?Reason for Encounter: Disease State - Hypertension Call  ?  ? ?Recent office visits:  ?10/30/21 Annual Medicare Wellness Completed.  ? ?01/23/22 Jenna Luo, MD - Family Medicine - Pleurisy - Chest Xray ordered. Follow up if no improvement.  ? ?Recent consult visits:  ?Outpatient rehab : 10/27/21, 10/13/21, 10/06/21, 10/01/21, 09/29/21, 09/25/21 ? ?Hospital visits: 07/03/21 ?Medication Reconciliation was completed by comparing discharge summary, patient?s EMR and Pharmacy list, and upon discussion with patient. ?  ?Admitted to the hospital on 07/03/21 due to Fall. Discharge date was 07/03/21. Discharged from [facility].   ?  ?New?Medications Started at [facility] Discharge:?? ?HYDROcodone-acetaminophen (NORCO) 5-325 MG tablet ?  ?Medication Changes at Hospital Discharge: ?None noted ?  ?Medications Discontinued at Hospital Discharge: ?None noted ?  ?Medications that remain the same after Hospital Discharge:??  ?All other medications will remain the same ? ?Medications: ?Outpatient Encounter Medications as of 11/17/2021  ?Medication Sig  ? atorvastatin (LIPITOR) 40 MG tablet TAKE 1 TABLET (40 MG TOTAL) BY MOUTH DAILY. PLEASE STOP PRAVASTATIN  ? cetirizine (ZYRTEC) 10 MG tablet Take 10 mg by mouth daily as needed for allergies.  ? finasteride (PROSCAR) 5 MG tablet Take 5 mg by mouth daily.  ? fluticasone (FLONASE) 50 MCG/ACT nasal spray Place 1 spray into both nostrils daily as needed for allergies or rhinitis.  ? linagliptin (TRADJENTA) 5 MG TABS tablet Take 1 tablet (5 mg total) by mouth daily.  ? LORazepam (ATIVAN) 0.5 MG tablet TAKE 1 TABLET BY MOUTH EVERY DAY AS NEEDED FOR ANXIETY  ? metoprolol tartrate (LOPRESSOR) 25 MG tablet Take 1 tablet (25 mg total) by mouth 2 (two) times daily.  ? PRADAXA 150 MG CAPS capsule TAKE 1 CAPSULE BY MOUTH TWICE A DAY  ? sulfamethoxazole-trimethoprim (BACTRIM DS)  800-160 MG tablet Take 1 tablet by mouth 2 (two) times daily.  ? ?No facility-administered encounter medications on file as of 11/17/2021.  ? ? ?Current antihypertensive regimen:  ?Metoprolol 25 mg 1 tablets by mouth 2 (two) times daily ?Finasteride 5 mg 1 tablet daily ? ?How often are you checking your Blood Pressure?  ?Patient reported checking blood pressures  ? ? ?Current home BP readings: 140/68 (yesterday 11/16/21) ? ? ?What recent interventions/DTPs have been made by any provider to improve Blood Pressure control since last CPP Visit:  ?Patient denied any recent changes to his blood pressure regimen.  ? ? ?Any recent hospitalizations or ED visits since last visit with CPP?  ?Patient did have an ED visit on 07/03/21 for a Fall.  ? ? ?What diet changes have been made to improve Blood Pressure Control?  ?Patient reported he continues to watch his salt intake in his diet.  ? ? ?What exercise is being done to improve your Blood Pressure Control?  ?Patient reported he remains active and has been recently doing a lot of yard work.  ? ? ? ?Adherence Review: ?Is the patient currently on ACE/ARB medication? No ?Does the patient have >5 day gap between last estimated fill dates? No  ? ? ?Care Gaps ? ?AWV: done 10/30/21 ?Colonoscopy: done 10/11/20 ?DM Eye Exam: unknown ?DM Foot Exam: due 08/16/20 ?Microalbumin: done 12/17/20 ?HbgAIC: done 05/05/21 (6.6) ?DEXA: N/A ?Mammogram: NA ? ? ?Star Rating Drugs: ?Atorvastatin (LIPITOR) 40 MG tablet - last filled 09/04/21 90 days ?Linagliptin (TRADJENTA) 5 MG TABS tablet - last filled 10/06/21 90 days ? ? ?  Future Appointments  ?Date Time Provider [place]  ?11/05/2022 11:15 AM [facility]-NURSE HEALTH ADVISOR [facility]  ? ? ? ?Liza Showfety, CCMA ?Clinical Pharmacist Assistant  ?[phone]  ?

## 2021-12-15 ENCOUNTER — Other Ambulatory Visit: Payer: Self-pay | Admitting: Nurse Practitioner

## 2021-12-15 ENCOUNTER — Other Ambulatory Visit: Payer: Self-pay | Admitting: Cardiology

## 2021-12-15 DIAGNOSIS — J302 Other seasonal allergic rhinitis: Secondary | ICD-10-CM

## 2021-12-15 NOTE — Telephone Encounter (Signed)
Pradaxa '150mg'$  refill request received. Pt is 86 years old, weight-78.5kg, Crea-0.96 on 10/30/2021, last seen by Roderic Palau on 05/15/2021, Diagnosis-Afib, CrCl-64.37m/min; Dose is appropriate based on dosing criteria. Will send in refill to requested pharmacy.    ?

## 2021-12-16 ENCOUNTER — Encounter: Payer: Self-pay | Admitting: Cardiology

## 2021-12-16 ENCOUNTER — Ambulatory Visit: Payer: Medicare Other | Admitting: Cardiology

## 2021-12-16 DIAGNOSIS — I480 Paroxysmal atrial fibrillation: Secondary | ICD-10-CM | POA: Diagnosis not present

## 2021-12-16 DIAGNOSIS — I951 Orthostatic hypotension: Secondary | ICD-10-CM | POA: Diagnosis not present

## 2021-12-16 NOTE — Assessment & Plan Note (Addendum)
Prior cardioversion successful.  Post ablation 2019.  He did state that he did not feel any different after his cardioversion.  Today he is back in atrial fibrillation again.  He did not notice this.  His heart rate is 83 bpm on ECG.  We discussed all potential scenarios including repeat ablation, repeat cardioversion, continued rate control.  Given his age, current significant lack of symptoms, we will continue with ongoing rate control.  We will also continue with anticoagulation, Pradaxa 150 mg twice a day.  Dr. Quentin Ore has seen in the past.  Continue with metoprolol 25 twice daily.  I will send a note to Dr. Quentin Ore to make sure he is in agreement. ?

## 2021-12-16 NOTE — Patient Instructions (Signed)
Medication Instructions:  The current medical regimen is effective;  continue present plan and medications.  *If you need a refill on your cardiac medications before your next appointment, please call your pharmacy*  Follow-Up: At [facility], you and your health needs are our priority.  As part of our continuing mission to provide you with exceptional heart care, we have created designated Provider Care Teams.  These Care Teams include your primary Cardiologist (physician) and Advanced Practice Providers (APPs -  Physician Assistants and Nurse Practitioners) who all work together to provide you with the care you need, when you need it.  We recommend signing up for the patient portal called "MyChart".  Sign up information is provided on this After Visit Summary.  MyChart is used to connect with patients for Virtual Visits (Telemedicine).  Patients are able to view lab/test results, encounter notes, upcoming appointments, etc.  Non-urgent messages can be sent to your provider as well.   To learn more about what you can do with MyChart, go to [email or web address].    Your next appointment:   6 month(s)  The format for your next appointment:   In Person  Provider:   Vin Bhagat, PA-C, Tessa Conte, PA-C, Dayna Dunn, PA-C, Ernest Dick, NP, Michele Lenze, PA-C, Michelle Swinyer, NP, or Scott Weaver, PA-C     {   Important Information About Sugar       

## 2021-12-16 NOTE — Progress Notes (Signed)
?Cardiology Office Note:   ? ?Date:  12/16/2021  ? ?ID:  Antonio Clark, DOB 09-11-1935, MRN [id number] ? ?PCP:  Antonio Frizzle, MD ?  ?[facility] Providers ?Cardiologist:  Antonio Furbish, MD ?Electrophysiologist:  Antonio Epley, MD    ? ?Referring MD: Antonio Frizzle, MD  ? ? ?History of Present Illness:   ? ?Antonio Clark is a 86 y.o. male history of diabetes  atrial fibrillation ablation 2021 CAD cardioversion prior ablation 2021.  CPAP. ? ?States that he did not feel really any different after the cardioversion.  A few years back he used to walk at the [facility] pretty frequently.  He does not do this anymore.  He does sometimes utilize a cane for stability.  He is very careful when getting off of his large lawnmower for instance.  He is thinking about moving to [employer] retirement community. ? ?Past Medical History:  ?Diagnosis Date  ? Anal fissure   ? Arthritis   ? Atrial fibrillation ([place])   ? Back pain   ? Colon polyps   ? Diabetes mellitus type 2 with complications ([place])   ? Dysrhythmia   ? a-fib  ? GERD (gastroesophageal reflux disease)   ? History of echocardiogram 07/ 07/ 2011  ? History of lithotripsy 1989  ? Hyperlipidemia   ? Hypertension   ? Kidney stones   ? Melanoma ([place]) 05/01/2019  ? right chest wall (12/20)  ? Prostate CA [facility]) 2010  ? Sleep apnea   ? uses CPAP nightly  ? Squamous cell carcinoma of skin 05/23/1992  ? bowens-left parietal scalp (CX35FU)  ? Squamous cell carcinoma of skin 03/14/2008  ? in situ-left upper outer forehead-medial (CX35FU)  ? Squamous cell carcinoma of skin 03/14/2008  ? in situ-crown of scalp (Cx35FU)  ? Squamous cell carcinoma of skin 04/15/2011  ? in situ-right dorsal forearm (txpbx)  ? Squamous cell carcinoma of skin 10/16/2011  ? in situ-left sideburn  ? Squamous cell carcinoma of skin 03/11/2015  ? ka-left sideburn (CX35FU)  ? Squamous cell carcinoma of skin 03/11/2015  ? ka-left forearm (CX35FU)  ? Squamous cell carcinoma of skin 06/22/2018  ? in situ-left  forearm, sup (txpbx)  ? Squamous cell carcinoma of skin 05/01/2019  ? in situ-mid anterior scalp   ? Squamous cell carcinoma of skin 05/01/2019  ? in situ-right upper arm  ? ? ?Past Surgical History:  ?Procedure Laterality Date  ? ATRIAL FIBRILLATION ABLATION N/A 04/17/2020  ? Procedure: ATRIAL FIBRILLATION ABLATION;  Surgeon: Antonio Epley, MD;  Location: [place] CV LAB;  Service: Cardiovascular;  Laterality: N/A;  ? AXILLARY SENTINEL NODE BIOPSY Right 08/23/2019  ? Procedure: SENTINEL LYMPH NODE BIOSPY RIGHT AXILLA;  Surgeon: Stark Klein, MD;  Location: [place];  Service: General;  Laterality: Right;  ? CARDIOVERSION N/A 05/10/2020  ? Procedure: CARDIOVERSION;  Surgeon: Freada Bergeron, MD;  Location: [facility] ENDOSCOPY;  Service: Cardiovascular;  Laterality: N/A;  ? CARDIOVERSION N/A 05/08/2021  ? Procedure: CARDIOVERSION;  Surgeon: Donato Heinz, MD;  Location: [facility] ENDOSCOPY;  Service: Cardiovascular;  Laterality: N/A;  ? COLONOSCOPY  2002  ? FASCIECTOMY Left 03/28/2019  ? Procedure: SEGMENTAL FASCIECTOMY LEFT RING FINGER;  Surgeon: Daryll Brod, MD;  Location: [place];  Service: Orthopedics;  Laterality: Left;  ANESTHESIA  AXILLARY BLOCK  ? INSERTION PROSTATE RADIATION SEED  8 4 2010  ? per Dr Rosana Hoes  ? MELANOMA EXCISION Right 06/22/2019  ? Procedure: WIDE LOCAL EXCISION  RIGHT CHEST WALL MELANOMA, ADVANCEMENT FLAP CLOSURE FOR DEFECT 3X6 CM;  Surgeon: Stark Klein, MD;  Location: [place];  Service: General;  Laterality: Right;  ? POLYPECTOMY  2002  ? THROAT SURGERY  1980  ? benign cyst  ? UPPER GASTROINTESTINAL ENDOSCOPY    ? URETHRAL STRICTURE DILATATION    ? also penile implant  ? ? ?Current Medications: ?Current Meds  ?Medication Sig  ? atorvastatin (LIPITOR) 10 MG tablet Take 10 mg by mouth daily.  ? cetirizine (ZYRTEC) 10 MG tablet Take 10 mg by mouth daily as needed for allergies.  ? finasteride (PROSCAR) 5 MG tablet Take 5 mg by mouth  daily.  ? fluticasone (FLONASE) 50 MCG/ACT nasal spray Place 1 spray into both nostrils daily as needed for allergies or rhinitis.  ? linagliptin (TRADJENTA) 5 MG TABS tablet Take 1 tablet (5 mg total) by mouth daily.  ? metoprolol tartrate (LOPRESSOR) 25 MG tablet Take 1 tablet (25 mg total) by mouth 2 (two) times daily.  ? PRADAXA 150 MG CAPS capsule TAKE 1 CAPSULE BY MOUTH TWICE A DAY  ?  ? ?Allergies:   Patient has no known allergies.  ? ?Social History  ? ?Socioeconomic History  ? Marital status: Single  ?  Spouse name: Not on file  ? Number of children: 0  ? Years of education: Not on file  ? Highest education level: Not on file  ?Occupational History  ? Occupation: retired  ?Tobacco Use  ? Smoking status: Never  ? Smokeless tobacco: Never  ?Vaping Use  ? Vaping Use: Never used  ?Substance and Sexual Activity  ? Alcohol use: Not Currently  ?  Comment: social  ? Drug use: No  ? Sexual activity: Yes  ?Other Topics Concern  ? Not on file  ?Social History Narrative  ? Not on file  ? ?Social Determinants of Health  ? ?Financial Resource Strain: Low Risk   ? Difficulty of Paying Living Expenses: Not hard at all  ?Food Insecurity: No Food Insecurity  ? Worried About Charity fundraiser in the Last Year: Never true  ? Ran Out of Food in the Last Year: Never true  ?Transportation Needs: No Transportation Needs  ? Lack of Transportation (Medical): No  ? Lack of Transportation (Non-Medical): No  ?Physical Activity: Inactive  ? Days of Exercise per Week: 0 days  ? Minutes of Exercise per Session: 0 min  ?Stress: No Stress Concern Present  ? Feeling of Stress : Not at all  ?Social Connections: Moderately Integrated  ? Frequency of Communication with Friends and Family: More than three times a week  ? Frequency of Social Gatherings with Friends and Family: More than three times a week  ? Attends Religious Services: More than 4 times per year  ? Active Member of Clubs or Organizations: Yes  ? Attends Archivist  Meetings: More than 4 times per year  ? Marital Status: Never married  ?  ? ?Family History: ?The patient's family history includes Arrhythmia in his brother; Colon cancer (age of onset: 70) in his mother; Coronary artery disease in his brother; Depression in his mother; Diabetes in his brother and brother; Hearing loss in his brother; Heart disease in his brother and father; Heart failure in his brother; Hyperlipidemia in his sister; Hypertension in his brother, brother, brother, and father; Stroke in his mother and sister; Sudden death in his father; Suicidality in his sister. There is no history of Esophageal cancer, Pancreatic cancer,  Prostate cancer, Rectal cancer, or Stomach cancer. ? ?ROS:   ?Please see the history of present illness.    ? All other systems reviewed and are negative. ? ?EKGs/Labs/Other Studies Reviewed:   ? ?The following studies were reviewed today: ? ?ECHO 2021 ? ? ? 1. Inferior basal hypokinesis. Left ventricular ejection fraction, by  ?estimation, is 50 to 55%. The left ventricle has low normal function. The  ?left ventricle demonstrates regional wall motion abnormalities (see  ?scoring diagram/findings for  ?description). Left ventricular diastolic parameters are indeterminate.  ? 2. Right ventricular systolic function is normal. The right ventricular  ?size is normal. There is normal pulmonary artery systolic pressure.  ? 3. Left atrial size was mildly dilated.  ? 4. The mitral valve is normal in structure. Trivial mitral valve  ?regurgitation. No evidence of mitral stenosis.  ? 5. The aortic valve is tricuspid. Aortic valve regurgitation is trivial.  ?Mild aortic valve sclerosis is present, with no evidence of aortic valve  ?stenosis.  ? 6. Aortic dilatation noted. There is mild dilatation of the ascending  ?aorta measuring 39 mm.  ? 7. The inferior vena cava is normal in size with greater than 50%  ?respiratory variability, suggesting right atrial pressure of 3 mmHg.  ? ?EKG:  EKG is   ordered today.  The ekg ordered today demonstrates atrial fibrillation 83 bpm rate controlled ? ?Recent Labs: ?03/07/2021: TSH 1.52 ?10/30/2021: ALT 7; BUN 14; Creat 0.96; Hemoglobin 13.4; Platelets 148; [place]

## 2021-12-16 NOTE — Telephone Encounter (Signed)
Requested medication (s) are due for refill today - provider review  ? ?Requested medication (s) are on the active medication list -yes ? ?Future visit scheduled -no ? ?Last refill: 03/04/21 ? ?Notes to clinic: historical provider ? ?Requested Prescriptions  ?Pending Prescriptions Disp Refills  ? cetirizine (ZYRTEC) 10 MG tablet [Pharmacy Med Name: CETIRIZINE HCL 10 MG TABLET] 90 tablet 3  ?  Sig: TAKE 1 TABLET BY MOUTH EVERY DAY. NOT COVERED BY INSURANCE.  ?  ? Ear, Nose, and Throat:  Antihistamines 2 Passed - 12/15/2021 10:24 AM  ?  ?  Passed - Cr in normal range and within 360 days  ?  Creat  ?Date Value Ref Range Status  ?10/30/2021 0.96 0.70 - 1.22 mg/dL Final  ?  ?  ?  ?  Passed - Valid encounter within last 12 months  ?  Recent Outpatient Visits   ? ?      ? 1 month ago Pleurisy  ? [facility] [place], Cammie Mcgee, MD  ? 7 months ago Wheezing  ? [facility] [place], Cammie Mcgee, MD  ? 9 months ago Balance problem  ? [place], Jessica A, NP  ? 12 months ago Persistent atrial fibrillation [facility])  ? [facility] [place], Cammie Mcgee, MD  ? 1 year ago Upper respiratory tract infection, unspecified type  ? [place] Eulogio Bear, NP  ? ?  ?  ? ? ?  ?  ?  ? ? ? ?Requested Prescriptions  ?Pending Prescriptions Disp Refills  ? cetirizine (ZYRTEC) 10 MG tablet [Pharmacy Med Name: CETIRIZINE HCL 10 MG TABLET] 90 tablet 3  ?  Sig: TAKE 1 TABLET BY MOUTH EVERY DAY. NOT COVERED BY INSURANCE.  ?  ? Ear, Nose, and Throat:  Antihistamines 2 Passed - 12/15/2021 10:24 AM  ?  ?  Passed - Cr in normal range and within 360 days  ?  Creat  ?Date Value Ref Range Status  ?10/30/2021 0.96 0.70 - 1.22 mg/dL Final  ?  ?  ?  ?  Passed - Valid encounter within last 12 months  ?  Recent Outpatient Visits   ? ?      ? 1 month ago Pleurisy  ? [facility] [place], Cammie Mcgee, MD  ? 7 months ago Wheezing  ? [facility]  [place], Cammie Mcgee, MD  ? 9 months ago Balance problem  ? [place], Jessica A, NP  ? 12 months ago Persistent atrial fibrillation [facility])  ? [facility] [place], Cammie Mcgee, MD  ? 1 year ago Upper respiratory tract infection, unspecified type  ? [place] Eulogio Bear, NP  ? ?  ?  ? ? ?  ?  ?  ? ? ? ?

## 2021-12-16 NOTE — Assessment & Plan Note (Signed)
Previously did have some decrease in blood pressure with standing in the office setting.  Today he is doing well.  He is very careful when getting off the lawnmower for instance. ?

## 2021-12-29 ENCOUNTER — Ambulatory Visit: Payer: Self-pay

## 2021-12-29 ENCOUNTER — Ambulatory Visit: Payer: Medicare Other | Admitting: Family Medicine

## 2021-12-29 ENCOUNTER — Encounter: Payer: Self-pay | Admitting: Family Medicine

## 2021-12-29 VITALS — BP 122/78 | HR 54 | Ht 69.0 in | Wt 185.9 lb

## 2021-12-29 DIAGNOSIS — M26609 Unspecified temporomandibular joint disorder, unspecified side: Secondary | ICD-10-CM | POA: Diagnosis not present

## 2021-12-29 MED ORDER — DICLOFENAC SODIUM 1 % EX GEL
2.0000 g | Freq: Four times a day (QID) | CUTANEOUS | 1 refills | Status: DC
Start: 2021-12-29 — End: 2022-05-28

## 2021-12-29 NOTE — Telephone Encounter (Signed)
Received call from patient's wife to follow up on conversation this morning. States patient's jaw clicks and hurts when he opens it beyond a certain point.    Called but unable to LM

## 2021-12-29 NOTE — Telephone Encounter (Signed)
Pt was triaged at 1515. See other triage note.

## 2021-12-29 NOTE — Telephone Encounter (Signed)
    Chief Complaint: Jaw pain Symptoms: Pain Frequency: 2 months ago Pertinent Negatives: Patient denies fever or tooth pain Disposition: '[]'$ ED /'[]'$ Urgent Care (no appt availability in office) / '[x]'$ Appointment(In office/virtual)/ '[]'$  [place] [facility]/ '[]'$ Home Care/ '[]'$ Refused Recommended Disposition /'[]'$  Mobile Bus/ '[]'$  Follow-up with PCP Additional Notes:   Reason for Disposition  Face pain present > 24 hours  Answer Assessment - Initial Assessment Questions 1. ONSET: "When did the pain start?" (e.g., minutes, hours, days)     1 month ago 2. ONSET: "Does the pain come and go, or has it been constant since it started?" (e.g., constant, intermittent, fleeting)     Jaw 3. SEVERITY: "How bad is the pain?"   (Scale 1-10; mild, moderate or severe)   - MILD (1-3): doesn't interfere with normal activities    - MODERATE (4-7): interferes with normal activities or awakens from sleep    - SEVERE (8-10): excruciating pain, unable to do any normal activities      6 4. LOCATION: "Where does it hurt?"      Jaw 5. RASH: "Is there any redness, rash, or swelling of the face?"     No 6. FEVER: "Do you have a fever?" If Yes, ask: "What is it, how was it measured, and when did it start?"      No 7. OTHER SYMPTOMS: "Do you have any other symptoms?" (e.g., fever, toothache, nasal discharge, nasal congestion, clicking sensation in jaw joint)     Clicking noise 8. PREGNANCY: "Is there any chance you are pregnant?" "When was your last menstrual period?"     N/a  Protocols used: Face Pain-A-AH

## 2021-12-29 NOTE — Progress Notes (Signed)
Subjective:    Patient ID: Antonio Clark, male    DOB: 1936/01/16, 86 y.o.   MRN: [id number] Patient reports a 1 month history of pain in his left TMJ joint.  He reports crepitus.  He states that he can feel a popping brought on when he eats.  At times he cannot fully extend his mouth to eat a sandwich.  At other times he feels like the joint locks up on him.  Today there is tenderness and pain with opening and shutting his jaw.  There is no palpable abnormality other than crepitus.  The remainder of his exam is normal. Past Medical History:  Diagnosis Date   Anal fissure    Arthritis    Atrial fibrillation ([place])    Back pain    Colon polyps    Diabetes mellitus type 2 with complications ([place])    Dysrhythmia    a-fib   GERD (gastroesophageal reflux disease)    History of echocardiogram 07/ 07/ 2011   History of lithotripsy 1989   Hyperlipidemia    Hypertension    Kidney stones    Melanoma ([place]) 05/01/2019   right chest wall (12/20)   Prostate CA ([place]) 2010   Sleep apnea    uses CPAP nightly   Squamous cell carcinoma of skin 05/23/1992   bowens-left parietal scalp (CX35FU)   Squamous cell carcinoma of skin 03/14/2008   in situ-left upper outer forehead-medial (CX35FU)   Squamous cell carcinoma of skin 03/14/2008   in situ-crown of scalp (Cx35FU)   Squamous cell carcinoma of skin 04/15/2011   in situ-right dorsal forearm (txpbx)   Squamous cell carcinoma of skin 10/16/2011   in situ-left sideburn   Squamous cell carcinoma of skin 03/11/2015   ka-left sideburn (CX35FU)   Squamous cell carcinoma of skin 03/11/2015   ka-left forearm (CX35FU)   Squamous cell carcinoma of skin 06/22/2018   in situ-left forearm, sup (txpbx)   Squamous cell carcinoma of skin 05/01/2019   in situ-mid anterior scalp    Squamous cell carcinoma of skin 05/01/2019   in situ-right upper arm   Past Surgical History:  Procedure Laterality Date   ATRIAL FIBRILLATION ABLATION N/A 04/17/2020   Procedure:  [address];  Surgeon: Vickie Epley, MD;  Location: [place] CV LAB;  Service: Cardiovascular;  Laterality: N/A;   AXILLARY SENTINEL NODE BIOPSY Right 08/23/2019   Procedure: SENTINEL LYMPH NODE BIOSPY RIGHT AXILLA;  Surgeon: Stark Klein, MD;  Location: [place];  Service: General;  Laterality: Right;   CARDIOVERSION N/A 05/10/2020   Procedure: CARDIOVERSION;  Surgeon: Freada Bergeron, MD;  Location: [facility] ENDOSCOPY;  Service: Cardiovascular;  Laterality: N/A;   CARDIOVERSION N/A 05/08/2021   Procedure: CARDIOVERSION;  Surgeon: Donato Heinz, MD;  Location: [facility] ENDOSCOPY;  Service: Cardiovascular;  Laterality: N/A;   COLONOSCOPY  2002   FASCIECTOMY Left 03/28/2019   Procedure: SEGMENTAL FASCIECTOMY LEFT RING FINGER;  Surgeon: Daryll Brod, MD;  Location: [place];  Service: Orthopedics;  Laterality: Left;  ANESTHESIA  AXILLARY BLOCK   INSERTION PROSTATE RADIATION SEED  8 4 2010   per Dr Rosana Hoes   MELANOMA EXCISION Right 06/22/2019   Procedure: WIDE LOCAL EXCISION RIGHT CHEST WALL MELANOMA, ADVANCEMENT FLAP CLOSURE FOR DEFECT 3X6 CM;  Surgeon: Stark Klein, MD;  Location: [place];  Service: General;  Laterality: Right;   POLYPECTOMY  2002   THROAT SURGERY  1980   benign cyst   UPPER GASTROINTESTINAL  ENDOSCOPY     URETHRAL STRICTURE DILATATION     also penile implant   Current Outpatient Medications on File Prior to Visit  Medication Sig Dispense Refill   atorvastatin (LIPITOR) 10 MG tablet Take 10 mg by mouth daily.     cetirizine (ZYRTEC) 10 MG tablet TAKE 1 TABLET BY MOUTH EVERY DAY. NOT COVERED BY INSURANCE. 90 tablet 3   finasteride (PROSCAR) 5 MG tablet Take 5 mg by mouth daily.     fluticasone (FLONASE) 50 MCG/ACT nasal spray Place 1 spray into both nostrils daily as needed for allergies or rhinitis.     linagliptin (TRADJENTA) 5 MG TABS tablet Take 1 tablet (5 mg total) by mouth daily. 90 tablet 3    metoprolol tartrate (LOPRESSOR) 25 MG tablet Take 1 tablet (25 mg total) by mouth 2 (two) times daily. 60 tablet 11   PRADAXA 150 MG CAPS capsule TAKE 1 CAPSULE BY MOUTH TWICE A DAY 180 capsule 1   No current facility-administered medications on file prior to visit.   No Known Allergies Social History   Socioeconomic History   Marital status: Single    Spouse name: Not on file   Number of children: 0   Years of education: Not on file   Highest education level: Not on file  Occupational History   Occupation: retired  Tobacco Use   Smoking status: Never   Smokeless tobacco: Never  Vaping Use   Vaping Use: Never used  Substance and Sexual Activity   Alcohol use: Not Currently    Comment: social   Drug use: No   Sexual activity: Yes  Other Topics Concern   Not on file  Social History Narrative   Not on file   Social Determinants of Health   Financial Resource Strain: Low Risk    Difficulty of Paying Living Expenses: Not hard at all  Food Insecurity: No Food Insecurity   Worried About Charity fundraiser in the Last Year: Never true   [place] in the Last Year: Never true  Transportation Needs: No Transportation Needs   Lack of Transportation (Medical): No   Lack of Transportation (Non-Medical): No  Physical Activity: Inactive   Days of Exercise per Week: 0 days   Minutes of Exercise per Session: 0 min  Stress: No Stress Concern Present   Feeling of Stress : Not at all  Social Connections: Moderately Integrated   Frequency of Communication with Friends and Family: More than three times a week   Frequency of Social Gatherings with Friends and Family: More than three times a week   Attends Religious Services: More than 4 times per year   Active Member of [employer] or Organizations: Yes   Attends Music therapist: More than 4 times per year   Marital Status: Never married  Human resources officer Violence: Not At Risk   Fear of Current or Ex-Partner: No    Emotionally Abused: No   Physically Abused: No   Sexually Abused: No   Family History  Problem Relation Age of Onset   Coronary artery disease Brother        3 brothers had CABG   Arrhythmia Brother    Diabetes Brother    Hearing loss Brother    Hypertension Brother    Heart disease Brother    Heart failure Brother    Hypertension Brother    Heart disease Father    Hypertension Father    Sudden death Father  Stroke Mother        cerebral hemorrage   Colon cancer Mother 63       small intestine   Depression Mother    Stroke Sister    Suicidality Sister    Diabetes Brother    Hypertension Brother    Hyperlipidemia Sister    Esophageal cancer Neg Hx    Pancreatic cancer Neg Hx    Prostate cancer Neg Hx    Rectal cancer Neg Hx    Stomach cancer Neg Hx      Review of Systems  All other systems reviewed and are negative.     Objective:   Physical Exam Vitals reviewed.  Constitutional:      General: He is not in acute distress.    Appearance: He is well-developed. He is not diaphoretic.  HENT:     Head: Normocephalic and atraumatic.     Jaw: There is normal jaw occlusion. Tenderness and pain on movement present. No trismus, swelling or malocclusion.     Salivary Glands: Right salivary gland is not diffusely enlarged or tender. Left salivary gland is not diffusely enlarged or tender.      Right Ear: Tympanic membrane, ear canal and external ear normal.     Left Ear: Tympanic membrane, ear canal and external ear normal.     Nose: Nose normal.     Mouth/Throat:     Pharynx: No oropharyngeal exudate.  Eyes:     General: No scleral icterus.       Right eye: No discharge.        Left eye: No discharge.     Conjunctiva/sclera: Conjunctivae normal.     Pupils: Pupils are equal, round, and reactive to light.  Neck:     Thyroid: No thyromegaly.     Vascular: No JVD.     Trachea: No tracheal deviation.  Cardiovascular:     Rate and Rhythm: Normal rate and regular  rhythm.     Heart sounds: Normal heart sounds. No murmur heard.   No friction rub. No gallop.  Pulmonary:     Effort: Pulmonary effort is normal. No respiratory distress.     Breath sounds: No stridor. No wheezing, rhonchi or rales.  Chest:     Chest wall: Tenderness present.    Musculoskeletal:        General: Tenderness present.     Cervical back: Normal range of motion and neck supple.  Lymphadenopathy:     Cervical: No cervical adenopathy.  Skin:    General: Skin is warm.     Coloration: Skin is not pale.     Findings: No erythema or rash.  Neurological:     General: No focal deficit present.     Mental Status: He is alert and oriented to person, place, and time.     Motor: No abnormal muscle tone.     Deep Tendon Reflexes: Reflexes are normal and symmetric.       Assessment & Plan:  TMJ (temporomandibular joint syndrome) Patient wears dentures so mouthguard is not necessary.  He denies grinding his teeth.  We will try Voltaren gel 4 times daily for 1 to 2 weeks to see if symptoms improve.  Avoid muscle relaxers given his dizziness and balance issues.

## 2022-01-06 ENCOUNTER — Ambulatory Visit: Payer: Medicare Other | Admitting: Dermatology

## 2022-01-19 ENCOUNTER — Other Ambulatory Visit: Payer: Self-pay | Admitting: Family Medicine

## 2022-01-19 DIAGNOSIS — F419 Anxiety disorder, unspecified: Secondary | ICD-10-CM

## 2022-01-20 ENCOUNTER — Encounter: Payer: Self-pay | Admitting: Family Medicine

## 2022-01-20 ENCOUNTER — Ambulatory Visit: Payer: Medicare Other | Admitting: Family Medicine

## 2022-01-20 VITALS — BP 112/70 | HR 119 | Temp 99.2°F | Ht 69.0 in | Wt 180.0 lb

## 2022-01-20 DIAGNOSIS — R062 Wheezing: Secondary | ICD-10-CM | POA: Diagnosis not present

## 2022-01-20 DIAGNOSIS — I480 Paroxysmal atrial fibrillation: Secondary | ICD-10-CM

## 2022-01-20 MED ORDER — LORAZEPAM 0.5 MG PO TABS: 0.5000 mg | ORAL_TABLET | Freq: Two times a day (BID) | ORAL | 1 refills | Status: DC | PRN

## 2022-01-20 MED ORDER — BUDESONIDE-FORMOTEROL FUMARATE 160-4.5 MCG/ACT IN AERO: 2.0000 | INHALATION_SPRAY | Freq: Two times a day (BID) | RESPIRATORY_TRACT | 3 refills | Status: DC

## 2022-01-20 NOTE — Progress Notes (Signed)
Subjective:    Patient ID: Antonio Clark, male    DOB: [birth year], 86 y.o.   MRN: [id number] In September, I gave the patient prednisone for wheezing and bronchospasm.  In March I gave the patient samples of Symbicort due to reports of wheezing and shortness of breath.  The patient was supposed to use the Symbicort twice a day however he has been using it as needed.  The inhaler is still half full.  He states that when he wakes up in the morning he will often hear himself wheezing and coughing.  He will then take 2 inhalations of Symbicort and the wheezing will improve and his shortness of breath will improve.  He is not using the medicine as a maintenance therapy but is rather using it as a rescue therapy.  He states that he is using it twice a week on average.  He denies chest pain.  However he states that when he gets short of breath and wheezes he becomes anxious and takes lorazepam.  We discussed switching to a rescue inhaler such as albuterol however he has atrial fibrillation and albuterol may exacerbate tachycardia.  Furthermore he receives subjective benefit from the Symbicort.  Chest x-ray earlier this year showed no acute abnormalities.  He denies any hemoptysis or fever or chills or pleurisy or chest pain. Past Medical History:  Diagnosis Date   Anal fissure    Arthritis    Atrial fibrillation ([place])    Back pain    Colon polyps    Diabetes mellitus type 2 with complications ([place])    Dysrhythmia    a-fib   GERD (gastroesophageal reflux disease)    History of echocardiogram 07/ 07/ 2011   History of lithotripsy 1989   Hyperlipidemia    Hypertension    Kidney stones    Melanoma ([place]) 05/01/2019   right chest wall (12/20)   Prostate CA ([place]) 2010   Sleep apnea    uses CPAP nightly   Squamous cell carcinoma of skin 05/23/1992   bowens-left parietal scalp (CX35FU)   Squamous cell carcinoma of skin 03/14/2008   in situ-left upper outer forehead-medial (CX35FU)   Squamous cell  carcinoma of skin 03/14/2008   in situ-crown of scalp (Cx35FU)   Squamous cell carcinoma of skin 04/15/2011   in situ-right dorsal forearm (txpbx)   Squamous cell carcinoma of skin 10/16/2011   in situ-left sideburn   Squamous cell carcinoma of skin 03/11/2015   ka-left sideburn (CX35FU)   Squamous cell carcinoma of skin 03/11/2015   ka-left forearm (CX35FU)   Squamous cell carcinoma of skin 06/22/2018   in situ-left forearm, sup (txpbx)   Squamous cell carcinoma of skin 05/01/2019   in situ-mid anterior scalp    Squamous cell carcinoma of skin 05/01/2019   in situ-right upper arm   Past Surgical History:  Procedure Laterality Date   ATRIAL FIBRILLATION ABLATION N/A 04/17/2020   Procedure: [place];  Surgeon: Vickie Epley, MD;  Location: [facility];  Service: Cardiovascular;  Laterality: N/A;   AXILLARY SENTINEL NODE BIOPSY Right 08/23/2019   Procedure: SENTINEL LYMPH NODE BIOSPY RIGHT AXILLA;  Surgeon: Stark Klein, MD;  Location: [facility];  Service: General;  Laterality: Right;   CARDIOVERSION N/A 05/10/2020   Procedure: CARDIOVERSION;  Surgeon: Freada Bergeron, MD;  Location: [facility] ENDOSCOPY;  Service: Cardiovascular;  Laterality: N/A;   CARDIOVERSION N/A 05/08/2021   Procedure: CARDIOVERSION;  Surgeon: Donato Heinz, MD;  Location: [facility]  ENDOSCOPY;  Service: Cardiovascular;  Laterality: N/A;   COLONOSCOPY  2002   FASCIECTOMY Left 03/28/2019   Procedure: SEGMENTAL FASCIECTOMY LEFT RING FINGER;  Surgeon: Daryll Brod, MD;  Location: [place];  Service: Orthopedics;  Laterality: Left;  ANESTHESIA  AXILLARY BLOCK   INSERTION PROSTATE RADIATION SEED  8 4 2010   per Dr Rosana Hoes   MELANOMA EXCISION Right 06/22/2019   Procedure: WIDE LOCAL EXCISION RIGHT CHEST WALL MELANOMA, ADVANCEMENT FLAP CLOSURE FOR DEFECT 3X6 CM;  Surgeon: Stark Klein, MD;  Location: [place];  Service: General;  Laterality:  Right;   POLYPECTOMY  2002   THROAT SURGERY  1980   benign cyst   UPPER GASTROINTESTINAL ENDOSCOPY     URETHRAL STRICTURE DILATATION     also penile implant   Current Outpatient Medications on File Prior to Visit  Medication Sig Dispense Refill   atorvastatin (LIPITOR) 10 MG tablet Take 10 mg by mouth daily.     cetirizine (ZYRTEC) 10 MG tablet TAKE 1 TABLET BY MOUTH EVERY DAY. NOT COVERED BY INSURANCE. 90 tablet 3   diclofenac Sodium (VOLTAREN) 1 % GEL Apply 2 g topically 4 (four) times daily. 50 g 1   finasteride (PROSCAR) 5 MG tablet Take 5 mg by mouth daily.     fluticasone (FLONASE) 50 MCG/ACT nasal spray Place 1 spray into both nostrils daily as needed for allergies or rhinitis.     linagliptin (TRADJENTA) 5 MG TABS tablet Take 1 tablet (5 mg total) by mouth daily. 90 tablet 3   metoprolol tartrate (LOPRESSOR) 25 MG tablet Take 1 tablet (25 mg total) by mouth 2 (two) times daily. 60 tablet 11   PRADAXA 150 MG CAPS capsule TAKE 1 CAPSULE BY MOUTH TWICE A DAY 180 capsule 1   No current facility-administered medications on file prior to visit.   No Known Allergies Social History   Socioeconomic History   Marital status: Single    Spouse name: Not on file   Number of children: 0   Years of education: Not on file   Highest education level: Not on file  Occupational History   Occupation: retired  Tobacco Use   Smoking status: Never   Smokeless tobacco: Never  Vaping Use   Vaping Use: Never used  Substance and Sexual Activity   Alcohol use: Not Currently    Comment: social   Drug use: No   Sexual activity: Yes  Other Topics Concern   Not on file  Social History Narrative   Not on file   Social Determinants of Health   Financial Resource Strain: Low Risk  (10/30/2021)   Overall Financial Resource Strain (CARDIA)    Difficulty of Paying Living Expenses: Not hard at all  Food Insecurity: No Food Insecurity (10/30/2021)   Hunger Vital Sign    Worried About Running Out  of Food in the Last Year: Never true    [place] in the Last Year: Never true  Transportation Needs: No Transportation Needs (10/30/2021)   PRAPARE - Hydrologist (Medical): No    Lack of Transportation (Non-Medical): No  Physical Activity: Inactive (10/30/2021)   Exercise Vital Sign    Days of Exercise per Week: 0 days    Minutes of Exercise per Session: 0 min  Stress: No Stress Concern Present (10/30/2021)   [place]    Feeling of Stress : Not at all  Social Connections:  Moderately Integrated (10/30/2021)   Social Connection and Isolation Panel [NHANES]    Frequency of Communication with Friends and Family: More than three times a week    Frequency of Social Gatherings with Friends and Family: More than three times a week    Attends Religious Services: More than 4 times per year    Active Member of [employer] or Organizations: Yes    Attends Music therapist: More than 4 times per year    Marital Status: Never married  Intimate Partner Violence: Not At Risk (10/30/2021)   Humiliation, Afraid, Rape, and Kick questionnaire    Fear of Current or Ex-Partner: No    Emotionally Abused: No    Physically Abused: No    Sexually Abused: No   Family History  Problem Relation Age of Onset   Coronary artery disease Brother        3 brothers had CABG   Arrhythmia Brother    Diabetes Brother    Hearing loss Brother    Hypertension Brother    Heart disease Brother    Heart failure Brother    Hypertension Brother    Heart disease Father    Hypertension Father    Sudden death Father    Stroke Mother        cerebral hemorrage   Colon cancer Mother 76       small intestine   Depression Mother    Stroke Sister    Suicidality Sister    Diabetes Brother    Hypertension Brother    Hyperlipidemia Sister    Esophageal cancer Neg Hx    Pancreatic cancer Neg Hx    Prostate cancer Neg  Hx    Rectal cancer Neg Hx    Stomach cancer Neg Hx      Review of Systems  Respiratory:  Positive for cough.   All other systems reviewed and are negative.      Objective:   Physical Exam Vitals reviewed.  Constitutional:      General: He is not in acute distress.    Appearance: He is well-developed. He is not diaphoretic.  HENT:     Head: Normocephalic and atraumatic.     Right Ear: External ear normal.     Left Ear: External ear normal.     Nose: Nose normal.     Mouth/Throat:     Pharynx: No oropharyngeal exudate.  Eyes:     General: No scleral icterus.       Right eye: No discharge.        Left eye: No discharge.     Conjunctiva/sclera: Conjunctivae normal.     Pupils: Pupils are equal, round, and reactive to light.  Neck:     Thyroid: No thyromegaly.     Vascular: No JVD.     Trachea: No tracheal deviation.  Cardiovascular:     Rate and Rhythm: Tachycardia present. Rhythm irregularly irregular.     Heart sounds: Normal heart sounds. No murmur heard.    No friction rub. No gallop.  Pulmonary:     Effort: Pulmonary effort is normal. No respiratory distress.     Breath sounds: No stridor. No wheezing, rhonchi or rales.  Abdominal:     General: Bowel sounds are normal. There is no distension.     Palpations: Abdomen is soft. There is no mass.     Tenderness: There is no abdominal tenderness. There is no guarding or rebound.  Musculoskeletal:  General: No tenderness. Normal range of motion.     Cervical back: Normal range of motion and neck supple.  Lymphadenopathy:     Cervical: No cervical adenopathy.  Skin:    General: Skin is warm.     Coloration: Skin is not pale.     Findings: No erythema or rash.  Neurological:     Mental Status: He is alert and oriented to person, place, and time.     Cranial Nerves: No cranial nerve deficit.     Motor: No abnormal muscle tone.     Coordination: Coordination normal.     Deep Tendon Reflexes: Reflexes are  normal and symmetric.  Psychiatric:        Behavior: Behavior normal.        Thought Content: Thought content normal.        Judgment: Judgment normal.        Assessment & Plan:  Wheezing  Paroxysmal atrial fibrillation ([place]) Patient is concerned by his wheezing and coughing.  Occurs a couple of times a week to the point he feels anxious and has to take lorazepam.  I have suggested that he try Symbicort 160/4.5, 2 puffs inhaled twice daily as maintenance therapy.  I believe that this may be more beneficial and cause less chance of tachycardia related to his atrial fibrillation.  I am hoping that this will help control the wheezing and the bronchospasms and the feelings of shortness of breath.  Chest x-ray thus far has been unremarkable.  We discussed a referral to pulmonology for pulmonary function test however the patient reports significant subjective benefit after he uses the Symbicort so I would like to see how he does over the next month using it as maintenance therapy.  If he see substantial benefit, I see no danger of him trying this medication rather than proceeding with formal pulmonary function test.

## 2022-01-22 ENCOUNTER — Telehealth: Payer: Self-pay | Admitting: Pharmacist

## 2022-01-22 NOTE — Progress Notes (Cosign Needed)
Chronic Care Management Pharmacy Assistant   Name: Antonio Clark  MRN: [id number] DOB: 09/22/1935   Reason for Encounter: Disease State - Hypertension Call     Recent office visits:  01/20/22 Jenna Luo MD - Family Medicine - Wheezing - budesonide-formoterol (SYMBICORT) 160-4.5 MCG/ACT inhaler and LORazepam (ATIVAN) 0.5 MG tablet prescribed. Follow up as scheduled.  12/29/21 Jenna Luo, MD - Family Medicine - TMJ - diclofenac Sodium (VOLTAREN) 1 % GEL prescribed.   Recent consult visits:  12/16/21 Candee Furbish, MD - Cardiology - Afib - EKG performed. atorvastatin (LIPITOR) 10 MG tablet prescribed. Follow up in 6 months.   11/22/21 Lenoria Chime - Urology - Urethral stricture - Bactrim prescribed. Follow up as scheduled.   Hospital visits:  None in previous 6 months  Medications: Outpatient Encounter Medications as of 01/22/2022  Medication Sig   atorvastatin (LIPITOR) 10 MG tablet Take 10 mg by mouth daily.   budesonide-formoterol (SYMBICORT) 160-4.5 MCG/ACT inhaler Inhale 2 puffs into the lungs 2 (two) times daily.   cetirizine (ZYRTEC) 10 MG tablet TAKE 1 TABLET BY MOUTH EVERY DAY. NOT COVERED BY INSURANCE.   diclofenac Sodium (VOLTAREN) 1 % GEL Apply 2 g topically 4 (four) times daily.   finasteride (PROSCAR) 5 MG tablet Take 5 mg by mouth daily.   fluticasone (FLONASE) 50 MCG/ACT nasal spray Place 1 spray into both nostrils daily as needed for allergies or rhinitis.   linagliptin (TRADJENTA) 5 MG TABS tablet Take 1 tablet (5 mg total) by mouth daily.   LORazepam (ATIVAN) 0.5 MG tablet Take 1 tablet (0.5 mg total) by mouth 2 (two) times daily as needed for anxiety.   metoprolol tartrate (LOPRESSOR) 25 MG tablet Take 1 tablet (25 mg total) by mouth 2 (two) times daily.   PRADAXA 150 MG CAPS capsule TAKE 1 CAPSULE BY MOUTH TWICE A DAY   No facility-administered encounter medications on file as of 01/22/2022.   Current antihypertensive regimen:  Metoprolol 25 mg 1 tablets  by mouth 2 (two) times daily Finasteride 5 mg 1 tablet daily  How often are you checking your Blood Pressure?  Patient reported checking blood pressures daily.    Current home BP readings: 112/70 (OV from 01/20/22)   What recent interventions/DTPs have been made by any provider to improve Blood Pressure control since last CPP Visit:  Patient denied any recent changes in medication regimen since last visit with CPP   Any recent hospitalizations or ED visits since last visit with CPP?  Patient has not had any hospitalizations or ED visits since last visit with CPP.   What diet changes have been made to improve Blood Pressure Control?  Patient reported limiting sodium in take in diet.   What exercise is being done to improve your Blood Pressure Control?  Patient reported staying active as possible.     Adherence Review: Is the patient currently on ACE/ARB medication? No Does the patient have >5 day gap between last estimated fill dates? No   Care Gaps   AWV: done 10/30/21 Colonoscopy: done 10/11/20 DM Eye Exam: unknown DM Foot Exam: due 08/16/20 Microalbumin: done 12/17/20 HbgAIC: done 05/05/21 (6.6) DEXA: N/A Mammogram: NA     Star Rating Drugs: Atorvastatin (LIPITOR) 40 MG tablet - last filled 09/04/21 90 days Linagliptin (TRADJENTA) 5 MG TABS tablet - last filled 10/06/21 90 days    Future Appointments  Date Time Provider [place]  02/03/2022 10:15 AM Lavonna Monarch, MD [facility]  02/26/2022  9:00 AM [facility]  PHARMACIST [facility]  11/05/2022 11:15 AM BSFM-NURSE HEALTH ADVISOR [facility] [place], [facility] Clinical Pharmacist Assistant  [phone]

## 2022-02-03 ENCOUNTER — Encounter: Payer: Self-pay | Admitting: Dermatology

## 2022-02-03 ENCOUNTER — Ambulatory Visit: Payer: Medicare Other | Admitting: Dermatology

## 2022-02-03 DIAGNOSIS — D0462 Carcinoma in situ of skin of left upper limb, including shoulder: Secondary | ICD-10-CM

## 2022-02-03 DIAGNOSIS — D044 Carcinoma in situ of skin of scalp and neck: Secondary | ICD-10-CM | POA: Diagnosis not present

## 2022-02-03 DIAGNOSIS — D0472 Carcinoma in situ of skin of left lower limb, including hip: Secondary | ICD-10-CM

## 2022-02-03 DIAGNOSIS — C4442 Squamous cell carcinoma of skin of scalp and neck: Secondary | ICD-10-CM | POA: Diagnosis not present

## 2022-02-03 DIAGNOSIS — D0439 Carcinoma in situ of skin of other parts of face: Secondary | ICD-10-CM

## 2022-02-03 DIAGNOSIS — D489 Neoplasm of uncertain behavior, unspecified: Secondary | ICD-10-CM

## 2022-02-03 DIAGNOSIS — D485 Neoplasm of uncertain behavior of skin: Secondary | ICD-10-CM

## 2022-02-03 MED ORDER — MUPIROCIN 2 % EX OINT: 1.0000 | TOPICAL_OINTMENT | Freq: Two times a day (BID) | CUTANEOUS | 1 refills | Status: DC

## 2022-02-08 ENCOUNTER — Other Ambulatory Visit: Payer: Self-pay

## 2022-02-08 ENCOUNTER — Inpatient Hospital Stay
Admission: EM | Admit: 2022-02-08 | Discharge: 2022-02-11 | DRG: 286 | Disposition: A | Discharge status: Other Acute Inpatient Hospital | Payer: Medicare Other | Attending: Osteopathic Medicine | Admitting: Osteopathic Medicine

## 2022-02-08 ENCOUNTER — Emergency Department: Payer: Medicare Other

## 2022-02-08 DIAGNOSIS — I5023 Acute on chronic systolic (congestive) heart failure: Secondary | ICD-10-CM | POA: Diagnosis present

## 2022-02-08 DIAGNOSIS — K219 Gastro-esophageal reflux disease without esophagitis: Secondary | ICD-10-CM | POA: Diagnosis present

## 2022-02-08 DIAGNOSIS — D6959 Other secondary thrombocytopenia: Secondary | ICD-10-CM | POA: Diagnosis present

## 2022-02-08 DIAGNOSIS — Z8249 Family history of ischemic heart disease and other diseases of the circulatory system: Secondary | ICD-10-CM

## 2022-02-08 DIAGNOSIS — D649 Anemia, unspecified: Secondary | ICD-10-CM | POA: Diagnosis present

## 2022-02-08 DIAGNOSIS — E118 Type 2 diabetes mellitus with unspecified complications: Secondary | ICD-10-CM

## 2022-02-08 DIAGNOSIS — R778 Other specified abnormalities of plasma proteins: Secondary | ICD-10-CM | POA: Diagnosis not present

## 2022-02-08 DIAGNOSIS — Z83438 Family history of other disorder of lipoprotein metabolism and other lipidemia: Secondary | ICD-10-CM

## 2022-02-08 DIAGNOSIS — I5021 Acute systolic (congestive) heart failure: Secondary | ICD-10-CM | POA: Diagnosis not present

## 2022-02-08 DIAGNOSIS — Z85828 Personal history of other malignant neoplasm of skin: Secondary | ICD-10-CM | POA: Diagnosis not present

## 2022-02-08 DIAGNOSIS — R0602 Shortness of breath: Secondary | ICD-10-CM | POA: Diagnosis not present

## 2022-02-08 DIAGNOSIS — I255 Ischemic cardiomyopathy: Secondary | ICD-10-CM | POA: Diagnosis not present

## 2022-02-08 DIAGNOSIS — Z800 Family history of malignant neoplasm of digestive organs: Secondary | ICD-10-CM | POA: Diagnosis not present

## 2022-02-08 DIAGNOSIS — I5031 Acute diastolic (congestive) heart failure: Secondary | ICD-10-CM

## 2022-02-08 DIAGNOSIS — I509 Heart failure, unspecified: Principal | ICD-10-CM

## 2022-02-08 DIAGNOSIS — Z8546 Personal history of malignant neoplasm of prostate: Secondary | ICD-10-CM | POA: Diagnosis not present

## 2022-02-08 DIAGNOSIS — I480 Paroxysmal atrial fibrillation: Secondary | ICD-10-CM

## 2022-02-08 DIAGNOSIS — Z7951 Long term (current) use of inhaled steroids: Secondary | ICD-10-CM

## 2022-02-08 DIAGNOSIS — E8770 Fluid overload, unspecified: Secondary | ICD-10-CM | POA: Diagnosis present

## 2022-02-08 DIAGNOSIS — I5043 Acute on chronic combined systolic (congestive) and diastolic (congestive) heart failure: Secondary | ICD-10-CM | POA: Diagnosis not present

## 2022-02-08 DIAGNOSIS — I4891 Unspecified atrial fibrillation: Secondary | ICD-10-CM | POA: Diagnosis not present

## 2022-02-08 DIAGNOSIS — Z20822 Contact with and (suspected) exposure to covid-19: Secondary | ICD-10-CM | POA: Diagnosis present

## 2022-02-08 DIAGNOSIS — E119 Type 2 diabetes mellitus without complications: Secondary | ICD-10-CM | POA: Diagnosis present

## 2022-02-08 DIAGNOSIS — Z823 Family history of stroke: Secondary | ICD-10-CM

## 2022-02-08 DIAGNOSIS — I110 Hypertensive heart disease with heart failure: Principal | ICD-10-CM | POA: Diagnosis present

## 2022-02-08 DIAGNOSIS — Z833 Family history of diabetes mellitus: Secondary | ICD-10-CM | POA: Diagnosis not present

## 2022-02-08 DIAGNOSIS — Z79899 Other long term (current) drug therapy: Secondary | ICD-10-CM

## 2022-02-08 DIAGNOSIS — Z7901 Long term (current) use of anticoagulants: Secondary | ICD-10-CM

## 2022-02-08 DIAGNOSIS — E785 Hyperlipidemia, unspecified: Secondary | ICD-10-CM | POA: Diagnosis present

## 2022-02-08 DIAGNOSIS — I340 Nonrheumatic mitral (valve) insufficiency: Secondary | ICD-10-CM | POA: Diagnosis not present

## 2022-02-08 DIAGNOSIS — E876 Hypokalemia: Secondary | ICD-10-CM | POA: Diagnosis present

## 2022-02-08 DIAGNOSIS — I200 Unstable angina: Secondary | ICD-10-CM | POA: Diagnosis not present

## 2022-02-08 DIAGNOSIS — I2510 Atherosclerotic heart disease of native coronary artery without angina pectoris: Secondary | ICD-10-CM | POA: Diagnosis present

## 2022-02-08 DIAGNOSIS — I4819 Other persistent atrial fibrillation: Secondary | ICD-10-CM | POA: Diagnosis present

## 2022-02-08 DIAGNOSIS — I361 Nonrheumatic tricuspid (valve) insufficiency: Secondary | ICD-10-CM | POA: Diagnosis not present

## 2022-02-08 DIAGNOSIS — R54 Age-related physical debility: Secondary | ICD-10-CM | POA: Diagnosis not present

## 2022-02-08 DIAGNOSIS — I160 Hypertensive urgency: Secondary | ICD-10-CM | POA: Diagnosis present

## 2022-02-08 DIAGNOSIS — R0600 Dyspnea, unspecified: Secondary | ICD-10-CM

## 2022-02-08 DIAGNOSIS — Z85820 Personal history of malignant melanoma of skin: Secondary | ICD-10-CM | POA: Diagnosis not present

## 2022-02-08 DIAGNOSIS — E7800 Pure hypercholesterolemia, unspecified: Secondary | ICD-10-CM | POA: Diagnosis not present

## 2022-02-08 DIAGNOSIS — N179 Acute kidney failure, unspecified: Secondary | ICD-10-CM | POA: Diagnosis not present

## 2022-02-08 DIAGNOSIS — Z818 Family history of other mental and behavioral disorders: Secondary | ICD-10-CM

## 2022-02-08 LAB — SARS CORONAVIRUS 2 BY RT PCR: SARS Coronavirus 2 by RT PCR: NEGATIVE

## 2022-02-08 LAB — CBC
HCT: 38.6 % — ABNORMAL LOW (ref 39.0–52.0)
Hemoglobin: 12 g/dL — ABNORMAL LOW (ref 13.0–17.0)
MCH: 28.3 pg (ref 26.0–34.0)
MCHC: 31.1 g/dL (ref 30.0–36.0)
MCV: 91 fL (ref 80.0–100.0)
Platelets: 149 10*3/uL — ABNORMAL LOW (ref 150–400)
RBC: 4.24 MIL/uL (ref 4.22–5.81)
RDW: 15.6 % — ABNORMAL HIGH (ref 11.5–15.5)
WBC: 6.7 10*3/uL (ref 4.0–10.5)
nRBC: 0 % (ref 0.0–0.2)

## 2022-02-08 LAB — TROPONIN I (HIGH SENSITIVITY)
Troponin I (High Sensitivity): 34 ng/L — ABNORMAL HIGH (ref <18)
Troponin I (High Sensitivity): 35 ng/L — ABNORMAL HIGH (ref <18)

## 2022-02-08 LAB — BASIC METABOLIC PANEL
Anion gap: 9 (ref 5–15)
BUN: 18 mg/dL (ref 8–23)
CO2: 24 mmol/L (ref 22–32)
Calcium: 9.4 mg/dL (ref 8.9–10.3)
Chloride: 109 mmol/L (ref 98–111)
Creatinine, Ser: 1 mg/dL (ref 0.61–1.24)
GFR, Estimated: >60 mL/min (ref >60)
Glucose, Bld: 173 mg/dL — ABNORMAL HIGH (ref 70–99)
Potassium: 3.6 mmol/L (ref 3.5–5.1)
Sodium: 142 mmol/L (ref 135–145)

## 2022-02-08 LAB — CBG MONITORING, ED: Glucose-Capillary: 146 mg/dL — ABNORMAL HIGH (ref 70–99)

## 2022-02-08 LAB — BRAIN NATRIURETIC PEPTIDE: B Natriuretic Peptide: 1751.8 pg/mL — ABNORMAL HIGH (ref 0.0–100.0)

## 2022-02-08 MED ORDER — IPRATROPIUM-ALBUTEROL 0.5-2.5 (3) MG/3ML IN SOLN
3.0000 mL | Freq: Once | RESPIRATORY_TRACT | Status: AC
  Administered 2022-02-08: 3 mL via RESPIRATORY_TRACT
  Filled 2022-02-08: qty 3

## 2022-02-08 MED ORDER — FLUTICASONE PROPIONATE 50 MCG/ACT NA SUSP
1.0000 | Freq: Every day | NASAL | Status: DC | PRN
Start: 2022-02-08 — End: 2022-02-11

## 2022-02-08 MED ORDER — INSULIN ASPART 100 UNIT/ML IJ SOLN
0.0000 [IU] | Freq: Three times a day (TID) | INTRAMUSCULAR | Status: DC
  Administered 2022-02-08 – 2022-02-09 (×5): 1 [IU] via SUBCUTANEOUS
  Administered 2022-02-10: 2 [IU] via SUBCUTANEOUS
  Administered 2022-02-11 (×2): 1 [IU] via SUBCUTANEOUS
  Filled 2022-02-08 (×8): qty 1

## 2022-02-08 MED ORDER — FUROSEMIDE 10 MG/ML IJ SOLN
40.0000 mg | Freq: Two times a day (BID) | INTRAMUSCULAR | Status: DC
  Administered 2022-02-09 – 2022-02-11 (×5): 40 mg via INTRAVENOUS
  Filled 2022-02-08 (×5): qty 4

## 2022-02-08 MED ORDER — ONDANSETRON HCL 4 MG PO TABS: 4.0000 mg | ORAL_TABLET | Freq: Four times a day (QID) | ORAL | Status: DC | PRN

## 2022-02-08 MED ORDER — ATORVASTATIN CALCIUM 10 MG PO TABS
10.0000 mg | ORAL_TABLET | Freq: Every day | ORAL | Status: DC
  Administered 2022-02-09 – 2022-02-11 (×3): 10 mg via ORAL
  Filled 2022-02-08 (×3): qty 1

## 2022-02-08 MED ORDER — MAGNESIUM HYDROXIDE 400 MG/5ML PO SUSP
30.0000 mL | Freq: Every day | ORAL | Status: DC | PRN
Start: 2022-02-08 — End: 2022-02-11
  Administered 2022-02-09: 30 mL via ORAL
  Filled 2022-02-08: qty 30

## 2022-02-08 MED ORDER — METOPROLOL TARTRATE 25 MG PO TABS
25.0000 mg | ORAL_TABLET | Freq: Two times a day (BID) | ORAL | Status: DC
  Administered 2022-02-08: 25 mg via ORAL
  Filled 2022-02-08: qty 1

## 2022-02-08 MED ORDER — LORATADINE 10 MG PO TABS
10.0000 mg | ORAL_TABLET | Freq: Every day | ORAL | Status: DC
  Administered 2022-02-08 – 2022-02-10 (×3): 10 mg via ORAL
  Filled 2022-02-08 (×4): qty 1

## 2022-02-08 MED ORDER — ACETAMINOPHEN 650 MG RE SUPP: 650.0000 mg | Freq: Four times a day (QID) | RECTAL | Status: DC | PRN

## 2022-02-08 MED ORDER — ONDANSETRON HCL 4 MG/2ML IJ SOLN: 4.0000 mg | Freq: Four times a day (QID) | INTRAMUSCULAR | Status: DC | PRN

## 2022-02-08 MED ORDER — IOHEXOL 350 MG/ML SOLN
75.0000 mL | Freq: Once | INTRAVENOUS | Status: AC | PRN
  Administered 2022-02-08: 75 mL via INTRAVENOUS

## 2022-02-08 MED ORDER — MUPIROCIN 2 % EX OINT
1.0000 | TOPICAL_OINTMENT | Freq: Two times a day (BID) | CUTANEOUS | Status: DC
  Administered 2022-02-08 – 2022-02-11 (×5): 1 via TOPICAL
  Filled 2022-02-08 (×2): qty 22

## 2022-02-08 MED ORDER — LINAGLIPTIN 5 MG PO TABS
5.0000 mg | ORAL_TABLET | Freq: Every day | ORAL | Status: DC
  Administered 2022-02-09 – 2022-02-11 (×3): 5 mg via ORAL
  Filled 2022-02-08 (×3): qty 1

## 2022-02-08 MED ORDER — LORAZEPAM 0.5 MG PO TABS: 0.5000 mg | ORAL_TABLET | Freq: Two times a day (BID) | ORAL | Status: DC | PRN

## 2022-02-08 MED ORDER — LABETALOL HCL 5 MG/ML IV SOLN
10.0000 mg | Freq: Once | INTRAVENOUS | Status: AC
  Administered 2022-02-08: 10 mg via INTRAVENOUS
  Filled 2022-02-08: qty 4

## 2022-02-08 MED ORDER — FINASTERIDE 5 MG PO TABS
5.0000 mg | ORAL_TABLET | Freq: Every day | ORAL | Status: DC
  Administered 2022-02-09 – 2022-02-11 (×3): 5 mg via ORAL
  Filled 2022-02-08 (×3): qty 1

## 2022-02-08 MED ORDER — DABIGATRAN ETEXILATE MESYLATE 150 MG PO CAPS
150.0000 mg | ORAL_CAPSULE | Freq: Two times a day (BID) | ORAL | Status: DC
  Administered 2022-02-08 – 2022-02-09 (×2): 150 mg via ORAL
  Filled 2022-02-08 (×2): qty 1

## 2022-02-08 MED ORDER — DICLOFENAC SODIUM 1 % EX GEL
2.0000 g | Freq: Four times a day (QID) | CUTANEOUS | Status: DC
  Administered 2022-02-08 – 2022-02-10 (×4): 2 g via TOPICAL
  Filled 2022-02-08: qty 100

## 2022-02-08 MED ORDER — FUROSEMIDE 10 MG/ML IJ SOLN
60.0000 mg | Freq: Once | INTRAMUSCULAR | Status: AC
  Administered 2022-02-08: 60 mg via INTRAVENOUS
  Filled 2022-02-08: qty 8

## 2022-02-08 MED ORDER — ACETAMINOPHEN 325 MG PO TABS: 650.0000 mg | ORAL_TABLET | Freq: Four times a day (QID) | ORAL | Status: DC | PRN

## 2022-02-08 MED ORDER — TRAZODONE HCL 50 MG PO TABS: 25.0000 mg | ORAL_TABLET | Freq: Every evening | ORAL | Status: DC | PRN

## 2022-02-08 NOTE — Assessment & Plan Note (Signed)
-   We will continue Pradaxa and Lopressor.

## 2022-02-08 NOTE — Assessment & Plan Note (Signed)
-   We will continue statin therapy. 

## 2022-02-08 NOTE — ED Provider Notes (Signed)
[facility] Provider Note    Event Date/Time   First MD Initiated Contact with Patient 02/08/22 1613     (approximate)  History   Chief Complaint: Shortness of Breath  HPI  Antonio Clark is a 86 y.o. male with a past medical history of diabetes, hypertension, hyperlipidemia, atrial fibrillation on Pradaxa presents to the emergency department for shortness of breath.  According to the patient over the past 3 to 4 days he has been feeling short of breath and wheezing at times.  No known fever.  States a slight cough over the past 3 days as well.  Denies any chest pain.  No pleuritic pain.  Patient does states some mild swelling in his legs right somewhat greater than left but no pain or discomfort.  Patient states he is prescribed Symbicort which she has been using but it does not appear to be helping.  Patient is not sure why he is prescribed Symbicort denies any history of COPD.  Has never smoked cigarettes.  Physical Exam   Triage Vital Signs: ED Triage Vitals  Enc Vitals Group     BP 02/08/22 1612 (!) 156/110     Pulse Rate 02/08/22 1612 (!) 106     Resp 02/08/22 1612 (!) 24     Temp 02/08/22 1612 98 F (36.7 C)     Temp Source 02/08/22 1612 Oral     SpO2 02/08/22 1612 97 %     Weight 02/08/22 1614 181 lb (82.1 kg)     Height 02/08/22 1614 '5\' 9"'$  (1.753 m)     Head Circumference --      Peak Flow --      Pain Score 02/08/22 1613 0     Pain Loc --      Pain Edu? --      Excl. in [place]? --     Most recent vital signs: Vitals:   02/08/22 1612 02/08/22 1630  BP: (!) 156/110 (!) 162/112  Pulse: (!) 106 (!) 109  Resp: (!) 24 (!) 27  Temp: 98 F (36.7 C)   SpO2: 97% 96%    General: Awake, no distress.  CV:  Good peripheral perfusion.  Regular rate and rhythm  Resp:  Normal effort.  Equal breath sounds bilaterally.  Abd:  No distention.  Soft, nontender.  No rebound or guarding. Other:  Mild lower extremity edema right somewhat greater than  left.   ED Results / Procedures / Treatments   EKG  EKG viewed and interpreted by myself shows atrial fibrillation 113 bpm with a narrow QRS, normal axis, normal intervals, nonspecific ST changes.  RADIOLOGY  I have viewed and interpreted the chest x-ray images.  Patient appears to have bilateral haziness but no focal consolidation. Radiology is read the chest x-ray is consistent with pulmonary edema with bilateral pleural effusions. CTA of the chest is negative for PE but confirmed bilateral pleural effusions.   MEDICATIONS ORDERED IN ED: Medications  ipratropium-albuterol (DUONEB) 0.5-2.5 (3) MG/3ML nebulizer solution 3 mL (has no administration in time range)  ipratropium-albuterol (DUONEB) 0.5-2.5 (3) MG/3ML nebulizer solution 3 mL (has no administration in time range)     IMPRESSION / MDM / ASSESSMENT AND PLAN / ED COURSE  I reviewed the triage vital signs and the nursing notes.  Patient's presentation is most consistent with acute presentation with potential threat to life or bodily function.  Patient presents emergency department for shortness of breath ongoing over the past 3 days along with  mild cough.  Differential would include pneumonia, pneumonitis or bronchitis, ACS, CHF/pulmonary edema.  Less likely PE as the patient is currently anticoagulated.  We will check labs including a troponin and a BNP.  We will obtain a chest x-ray as well as an EKG and continue to closely monitor.  If the patient's creatinine can tolerate we may proceed with CTA imaging of the chest as well.  We will dose DuoNebs and reassess.  Patient is currently on room air satting 96% but remains tachypneic around 27.  Patient has a history of atrial fibrillation but appears to be in slight RVR at 105 to 110 bpm.  We will dose a small dose of diltiazem as well.  Patient's labs are resulted showing significant BNP of 1700, mild troponin elevation although largely unchanged after 3 hours.  Patient's chest  x-ray is consistent with pulmonary edema.  Given the patient's tachypnea new onset of dyspnea CTA of the chest was ordered to rule out pulmonary embolism.  No sign of PE but patient does have pleural effusions.  Overall picture consistent with new onset CHF.  We will start the patient on IV Lasix and admit to the hospital service.  Patient agreeable to plan of care.  FINAL CLINICAL IMPRESSION(S) / ED DIAGNOSES   Atrial fibrillation with rapid ventricular response Dyspnea New onset CHF   Note:  This document was prepared using Dragon voice recognition software and may include unintentional dictation errors.   Harvest Dark, MD 02/08/22 [phone]

## 2022-02-08 NOTE — Assessment & Plan Note (Signed)
-   We will continue statin therapy as well as beta-blocker therapy.

## 2022-02-08 NOTE — Assessment & Plan Note (Addendum)
This is likely of diastolic etiology and could be related to hypertensive urgency. - The patient will be admitted to cardiac telemetry bed. - We will continue diuresis with IV Lasix. - We will follow serial troponins. - 2D echo and cardiology consult will be obtained. - I notified Dr. Harrell Gave about the patient.

## 2022-02-08 NOTE — Assessment & Plan Note (Signed)
-   We will continue Tradjenta. - We will place the patient on supplement coverage with NovoLog.

## 2022-02-08 NOTE — ED Notes (Signed)
Pt ambulated to room toilet. Pt became very SOB. RN checked pt room sat. Pt de-sat to low 80's. RN applied 2l/min via [place] on pt. Pt o2 sat returned to normal range.

## 2022-02-08 NOTE — ED Triage Notes (Signed)
Pt to ED for SOB and wheezing since 2 weeks, worse since 1 week, saw PCP who gave symbicort refill but is using at home more than usual with no relief. Pt appears tachypneic, audible wheezing, prolonged expiratory phase and accessory muscle use (in neck). Speaking in full sentences. States feels like "straining in my throat". EDP on way to see pt. A fib on monitor with frequent PVCs.

## 2022-02-08 NOTE — Assessment & Plan Note (Signed)
-   This could be the culprit for her acute CHF. - We will continue Lopressor and placed on as needed IV labetalol.

## 2022-02-08 NOTE — H&P (Addendum)
Male     [place]   PATIENT NAME: Antonio Clark    MR#:  [id number]  DATE OF BIRTH:  07-09-1936  DATE OF ADMISSION:  02/08/2022  PRIMARY CARE PHYSICIAN: Susy Frizzle, MD   Patient is coming from: Home  REQUESTING/REFERRING PHYSICIAN: Harvest Dark, and  CHIEF COMPLAINT:   Chief Complaint  Patient presents with   Shortness of Breath    HISTORY OF PRESENT ILLNESS:  Antonio Clark is a 86 y.o. Caucasian male with medical history significant for GERD, hypertension, dyslipidemia, urolithiasis, OSA on CPAP, and atrial fibrillation on Pradaxa, who presented to the emergency room with acute onset of worsening dyspnea with associated dry cough occasionally productive of clear sputum with occasional blood tinges as well as wheezing for the last 5 to 6 days.  He denied any fever or chills.  No dysuria, oliguria or hematuria or flank pain.  No nausea or vomiting or abdominal pain.  ED Course: When he came to the ER BP was 156/110 with heart rate 106 and respiratory to 24 and Pulsoxymeter was 97% on room air.  Labs revealed a BNP of 1751.8, blood glucose of 173 and unremarkable BMP otherwise.  High-sensitivity troponin was 34 and CBC showed mild anemia worse than previous levels and mild thrombocytopenia. EKG as reviewed by me : EKG showed atrial fibrillation with rapid ventricular response of 113 with PVCs. Imaging: Two-view chest x-ray showed pulmonary edema and bilateral trace pleural effusions.  The patient was given 60 mg of IV Lasix, DuoNebs twice and 10 mg of IV labetalol.  He will be admitted to a cardiac telemetry bed for further evaluation and management. PAST MEDICAL HISTORY:   Past Medical History:  Diagnosis Date   Anal fissure    Arthritis    Atrial fibrillation ([place])    Back pain    Colon polyps    Diabetes mellitus type 2 with complications ([place])    Dysrhythmia    a-fib   GERD (gastroesophageal reflux disease)    History of echocardiogram 07/ 07/ 2011    History of lithotripsy 1989   Hyperlipidemia    Hypertension    Kidney stones    Melanoma ([place]) 05/01/2019   right chest wall (12/20)   Prostate CA ([place]) 2010   Sleep apnea    uses CPAP nightly   Squamous cell carcinoma of skin 05/23/1992   bowens-left parietal scalp (CX35FU)   Squamous cell carcinoma of skin 03/14/2008   in situ-left upper outer forehead-medial ([id number])   Squamous cell carcinoma of skin 03/14/2008   in situ-crown of scalp ([id number])   Squamous cell carcinoma of skin 04/15/2011   in situ-right dorsal forearm (txpbx)   Squamous cell carcinoma of skin 10/16/2011   in situ-left sideburn   Squamous cell carcinoma of skin 03/11/2015   ka-left sideburn ([id number])   Squamous cell carcinoma of skin 03/11/2015   ka-left forearm ([id number])   Squamous cell carcinoma of skin 06/22/2018   in situ-left forearm, sup (txpbx)   Squamous cell carcinoma of skin 05/01/2019   in situ-mid anterior scalp    Squamous cell carcinoma of skin 05/01/2019   in situ-right upper arm    PAST SURGICAL HISTORY:   Past Surgical History:  Procedure Laterality Date   ATRIAL FIBRILLATION ABLATION N/A 04/17/2020   Procedure: ATRIAL FIBRILLATION ABLATION;  Surgeon: Vickie Epley, MD;  Location: [facility];  Service: Cardiovascular;  Laterality: N/A;   AXILLARY SENTINEL NODE BIOPSY Right 08/23/2019   Procedure: [employer]  LYMPH NODE BIOSPY RIGHT AXILLA;  Surgeon: Stark Klein, MD;  Location: [facility];  Service: General;  Laterality: Right;   CARDIOVERSION N/A 05/10/2020   Procedure: CARDIOVERSION;  Surgeon: Freada Bergeron, MD;  Location: [facility] ENDOSCOPY;  Service: Cardiovascular;  Laterality: N/A;   CARDIOVERSION N/A 05/08/2021   Procedure: CARDIOVERSION;  Surgeon: Donato Heinz, MD;  Location: [facility] ENDOSCOPY;  Service: Cardiovascular;  Laterality: N/A;   COLONOSCOPY  2002   FASCIECTOMY Left 03/28/2019   Procedure: SEGMENTAL FASCIECTOMY LEFT RING FINGER;  Surgeon:  Daryll Brod, MD;  Location: [place];  Service: Orthopedics;  Laterality: Left;  ANESTHESIA  AXILLARY BLOCK   INSERTION PROSTATE RADIATION SEED  8 4 2010   per Dr Rosana Hoes   MELANOMA EXCISION Right 06/22/2019   Procedure: WIDE LOCAL EXCISION RIGHT CHEST WALL MELANOMA, ADVANCEMENT FLAP CLOSURE FOR DEFECT 3X6 CM;  Surgeon: Stark Klein, MD;  Location: [facility];  Service: General;  Laterality: Right;   POLYPECTOMY  2002   THROAT SURGERY  1980   benign cyst   UPPER GASTROINTESTINAL ENDOSCOPY     URETHRAL STRICTURE DILATATION     also penile implant    SOCIAL HISTORY:   Social History   Tobacco Use   Smoking status: Never   Smokeless tobacco: Never  Substance Use Topics   Alcohol use: Not Currently    Comment: social    FAMILY HISTORY:   Family History  Problem Relation Age of Onset   Coronary artery disease Brother        3 brothers had CABG   Arrhythmia Brother    Diabetes Brother    Hearing loss Brother    Hypertension Brother    Heart disease Brother    Heart failure Brother    Hypertension Brother    Heart disease Father    Hypertension Father    Sudden death Father    Stroke Mother        cerebral hemorrage   Colon cancer Mother 58       small intestine   Depression Mother    Stroke Sister    Suicidality Sister    Diabetes Brother    Hypertension Brother    Hyperlipidemia Sister    Esophageal cancer Neg Hx    Pancreatic cancer Neg Hx    Prostate cancer Neg Hx    Rectal cancer Neg Hx    Stomach cancer Neg Hx     DRUG ALLERGIES:  No Known Allergies  REVIEW OF SYSTEMS:   ROS As per history of present illness. All pertinent systems were reviewed above. Constitutional, HEENT, cardiovascular, respiratory, GI, GU, musculoskeletal, neuro, psychiatric, endocrine, integumentary and hematologic systems were reviewed and are otherwise negative/unremarkable except for positive findings mentioned above in the HPI.   MEDICATIONS AT  HOME:   Prior to Admission medications   Medication Sig Start Date End Date Taking? Authorizing Provider  atorvastatin (LIPITOR) 10 MG tablet Take 10 mg by mouth daily.    [provider]  budesonide-formoterol (SYMBICORT) 160-4.5 MCG/ACT inhaler Inhale 2 puffs into the lungs 2 (two) times daily. 01/20/22   Susy Frizzle, MD  cetirizine (ZYRTEC) 10 MG tablet TAKE 1 TABLET BY MOUTH EVERY DAY. NOT COVERED BY INSURANCE. 12/16/21   Susy Frizzle, MD  diclofenac Sodium (VOLTAREN) 1 % GEL Apply 2 g topically 4 (four) times daily. 12/29/21   Susy Frizzle, MD  finasteride (PROSCAR) 5 MG tablet Take 5 mg by mouth daily. 08/22/21  [provider]  fluticasone (FLONASE) 50 MCG/ACT nasal spray Place 1 spray into both nostrils daily as needed for allergies or rhinitis.    [provider]  linagliptin (TRADJENTA) 5 MG TABS tablet Take 1 tablet (5 mg total) by mouth daily. 10/06/21   Susy Frizzle, MD  LORazepam (ATIVAN) 0.5 MG tablet Take 1 tablet (0.5 mg total) by mouth 2 (two) times daily as needed for anxiety. 01/20/22   Susy Frizzle, MD  metoprolol tartrate (LOPRESSOR) 25 MG tablet Take 1 tablet (25 mg total) by mouth 2 (two) times daily. 05/16/21   Jerline Pain, MD  mupirocin ointment (BACTROBAN) 2 % Apply 1 Application topically 2 (two) times daily. 02/03/22   Lavonna Monarch, MD  PRADAXA 150 MG CAPS capsule TAKE 1 CAPSULE BY MOUTH TWICE A DAY 12/15/21   Jerline Pain, MD      VITAL SIGNS:  Blood pressure (!) 137/98, pulse (!) 102, temperature 98.1 F (36.7 C), temperature source Oral, resp. rate 20, height '5\' 9"'$  (1.753 m), weight 82.1 kg, SpO2 94 %.  PHYSICAL EXAMINATION:  Physical Exam  GENERAL:  86 y.o.-year-old Caucasian male patient lying in the bed with no acute distress.  EYES: Pupils equal, round, reactive to light and accommodation. No scleral icterus. Extraocular muscles intact.  HEENT: Head atraumatic, normocephalic. Oropharynx and nasopharynx  clear.  NECK:  Supple, no jugular venous distention. No thyroid enlargement, no tenderness.  LUNGS: Diminished bibasilar breath sounds with bibasilar rales.  No use of accessory muscles of respiration.  CARDIOVASCULAR: Regular rate and rhythm, S1, S2 normal. No murmurs, rubs, or gallops.  ABDOMEN: Soft, nondistended, nontender. Bowel sounds present. No organomegaly or mass.  EXTREMITIES: 1-2+ bilateral lower extremity pitting edema with no cyanosis, or clubbing.  NEUROLOGIC: Cranial nerves II through XII are intact. Muscle strength 5/5 in all extremities. Sensation intact. Gait not checked.  PSYCHIATRIC: The patient is alert and oriented x 3.  Normal affect and good eye contact. SKIN: No obvious rash, lesion, or ulcer.   LABORATORY PANEL:   CBC Recent Labs  Lab 02/08/22 1617  WBC 6.7  HGB 12.0*  HCT 38.6*  PLT 149*   ------------------------------------------------------------------------------------------------------------------  Chemistries  Recent Labs  Lab 02/08/22 1617  NA 142  K 3.6  CL 109  CO2 24  GLUCOSE 173*  BUN 18  CREATININE 1.00  CALCIUM 9.4   ------------------------------------------------------------------------------------------------------------------  Cardiac Enzymes No results for input(s): "TROPONINI" in the last 168 hours. ------------------------------------------------------------------------------------------------------------------  RADIOLOGY:  CT Angio Chest PE W and/or Wo Contrast  Result Date: 02/08/2022 CLINICAL DATA:  Pulmonary embolism (PE) suspected, high prob EXAM: CT ANGIOGRAPHY CHEST WITH CONTRAST TECHNIQUE: Multidetector CT imaging of the chest was performed using the standard protocol during bolus administration of intravenous contrast. Multiplanar CT image reconstructions and MIPs were obtained to evaluate the vascular anatomy. RADIATION DOSE REDUCTION: This exam was performed according to the departmental dose-optimization program  which includes automated exposure control, adjustment of the mA and/or kV according to patient size and/or use of iterative reconstruction technique. CONTRAST:  61m OMNIPAQUE IOHEXOL 350 MG/ML SOLN COMPARISON:  None Available. FINDINGS: Cardiovascular: Satisfactory opacification of the pulmonary arteries to the segmental level. No evidence of pulmonary embolism. The main pulmonary artery measures 3.1 cm. Prominent heart size. No significant pericardial effusion. The thoracic aorta is normal in caliber. No atherosclerotic plaque of the thoracic aorta. No coronary artery calcifications. Mediastinum/Nodes: No enlarged mediastinal, hilar, or axillary lymph nodes. Thyroid gland, trachea, and esophagus demonstrate no significant findings.  Lungs/Pleura: Expiratory phase of respiration with mosaic attenuation of the lungs. No focal consolidation. No pulmonary nodule. No pulmonary mass. Bilateral small to moderate, right greater than left, pleural effusions. No pneumothorax. Diffuse mild bronchial wall thickening. Upper Abdomen: No acute abnormality. Musculoskeletal: No chest wall abnormality. No suspicious lytic or blastic osseous lesions. No acute displaced fracture. Multilevel degenerative changes of the spine. Review of the MIP images confirms the above findings. IMPRESSION: 1. Bilateral small to moderate, right greater than left, pleural effusions. 2. Bronchial wall thickening suggestive of small airway disease. 3. Cardiomegaly. Electronically Signed   By: Iven Finn M.D.   On: 02/08/2022 18:44   DG Chest 2 View  Result Date: 02/08/2022 CLINICAL DATA:  sob EXAM: CHEST - 2 VIEW COMPARISON:  None Available. FINDINGS: The heart and mediastinal contours are unchanged. Prominent hilar vasculature. No focal consolidation. Increased interstitial markings. Bilateral trace pleural effusion. No pneumothorax. No acute osseous abnormality.  Right chest wall vascular clips. IMPRESSION: Pulmonary edema.  Bilateral trace  pleural effusions. Electronically Signed   By: Iven Finn M.D.   On: 02/08/2022 17:00      IMPRESSION AND PLAN:  Assessment and Plan: * Acute CHF (congestive heart failure) ([place]) This is likely of diastolic etiology and could be related to hypertensive urgency. - The patient will be admitted to cardiac telemetry bed. - We will continue diuresis with IV Lasix. - We will follow serial troponins. - 2D echo and cardiology consult will be obtained. - I notified Dr. Harrell Gave about the patient.  Hypertensive urgency - This could be the culprit for her acute CHF. - We will continue Lopressor and placed on as needed IV labetalol.  Paroxysmal atrial fibrillation with rapid ventricular response (HCC) - We will continue Pradaxa and Lopressor.  Type 2 diabetes mellitus with complication, without long-term current use of insulin (HCC) - We will continue Tradjenta. - We will place the patient on supplement coverage with NovoLog.  Coronary artery disease - We will continue statin therapy as well as beta-blocker therapy.  Hyperlipidemia - We will continue statin therapy.    DVT prophylaxis: Pradaxa.   Advanced Care Planning:  Code Status: full code.  Family Communication:  The plan of care was discussed in details with the patient (and family). I answered all questions. The patient agreed to proceed with the above mentioned plan. Further management will depend upon hospital course. Disposition Plan: Back to previous home environment Consults called: Cardiology consult. All the records are reviewed and case discussed with ED provider.  Status is: Inpatient  At the time of the admission, it appears that the appropriate admission status for this patient is inpatient.  This is judged to be reasonable and necessary in order to provide the required intensity of service to ensure the patient's safety given the presenting symptoms, physical exam findings and initial radiographic and  laboratory data in the context of comorbid conditions.  The patient requires inpatient status due to high intensity of service, high risk of further deterioration and high frequency of surveillance required.  I certify that at the time of admission, it is my clinical judgment that the patient will require inpatient hospital care extending more than 2 midnights.                            Dispo: The patient is from: Home              Anticipated d/c is to: Home  Patient currently is not medically stable to d/c.              Difficult to place patient: No  Christel Mormon M.D on 02/09/2022 at 1:16 AM  [facility]   From 7 PM-7 AM, contact night-coverage [email or web address]  CC: Primary care physician; Susy Frizzle, MD

## 2022-02-09 ENCOUNTER — Inpatient Hospital Stay (HOSPITAL_COMMUNITY)
Admit: 2022-02-09 | Discharge: 2022-02-09 | Disposition: A | Discharge status: Home / Self Care | Payer: Medicare Other | Attending: Physician Assistant | Admitting: Physician Assistant

## 2022-02-09 DIAGNOSIS — I480 Paroxysmal atrial fibrillation: Secondary | ICD-10-CM

## 2022-02-09 DIAGNOSIS — I4819 Other persistent atrial fibrillation: Secondary | ICD-10-CM

## 2022-02-09 DIAGNOSIS — I5031 Acute diastolic (congestive) heart failure: Secondary | ICD-10-CM

## 2022-02-09 DIAGNOSIS — I5023 Acute on chronic systolic (congestive) heart failure: Secondary | ICD-10-CM

## 2022-02-09 LAB — CBC
HCT: 41.7 % (ref 39.0–52.0)
Hemoglobin: 13.1 g/dL (ref 13.0–17.0)
MCH: 28.4 pg (ref 26.0–34.0)
MCHC: 31.4 g/dL (ref 30.0–36.0)
MCV: 90.5 fL (ref 80.0–100.0)
Platelets: 187 10*3/uL (ref 150–400)
RBC: 4.61 MIL/uL (ref 4.22–5.81)
RDW: 15.7 % — ABNORMAL HIGH (ref 11.5–15.5)
WBC: 8.6 10*3/uL (ref 4.0–10.5)
nRBC: 0 % (ref 0.0–0.2)

## 2022-02-09 LAB — BASIC METABOLIC PANEL
Anion gap: 7 (ref 5–15)
BUN: 19 mg/dL (ref 8–23)
CO2: 26 mmol/L (ref 22–32)
Calcium: 9.6 mg/dL (ref 8.9–10.3)
Chloride: 109 mmol/L (ref 98–111)
Creatinine, Ser: 1.28 mg/dL — ABNORMAL HIGH (ref 0.61–1.24)
GFR, Estimated: 55 mL/min — ABNORMAL LOW (ref >60)
Glucose, Bld: 128 mg/dL — ABNORMAL HIGH (ref 70–99)
Potassium: 3.1 mmol/L — ABNORMAL LOW (ref 3.5–5.1)
Sodium: 142 mmol/L (ref 135–145)

## 2022-02-09 LAB — GLUCOSE, CAPILLARY
Glucose-Capillary: 129 mg/dL — ABNORMAL HIGH (ref 70–99)
Glucose-Capillary: 134 mg/dL — ABNORMAL HIGH (ref 70–99)

## 2022-02-09 LAB — ECHOCARDIOGRAM COMPLETE
AR max vel: 2.07 cm2
AV Area VTI: 2.03 cm2
AV Area mean vel: 1.98 cm2
AV Mean grad: 2 mmHg
AV Peak grad: 2.5 mmHg
Ao pk vel: 0.78 m/s
Area-P 1/2: 3.24 cm2
Height: 69 in
P 1/2 time: 470 msec
S' Lateral: 3.97 cm
Weight: 2896 oz

## 2022-02-09 LAB — CBG MONITORING, ED
Glucose-Capillary: 133 mg/dL — ABNORMAL HIGH (ref 70–99)
Glucose-Capillary: 145 mg/dL — ABNORMAL HIGH (ref 70–99)

## 2022-02-09 LAB — TSH: TSH: 3.207 u[IU]/mL (ref 0.350–4.500)

## 2022-02-09 MED ORDER — HEPARIN (PORCINE) 25000 UT/250ML-% IV SOLN
1200.0000 [IU]/h | INTRAVENOUS | Status: DC
Start: 2022-02-10 — End: 2022-02-11
  Administered 2022-02-10 (×2): 1200 [IU]/h via INTRAVENOUS
  Filled 2022-02-09 (×2): qty 250

## 2022-02-09 MED ORDER — POTASSIUM CHLORIDE CRYS ER 20 MEQ PO TBCR
40.0000 meq | EXTENDED_RELEASE_TABLET | ORAL | Status: AC
  Administered 2022-02-09 (×2): 40 meq via ORAL
  Filled 2022-02-09 (×2): qty 2

## 2022-02-09 MED ORDER — METOPROLOL TARTRATE 50 MG PO TABS
50.0000 mg | ORAL_TABLET | Freq: Two times a day (BID) | ORAL | Status: DC
  Administered 2022-02-09 – 2022-02-11 (×5): 50 mg via ORAL
  Filled 2022-02-09 (×3): qty 1
  Filled 2022-02-09: qty 2
  Filled 2022-02-09: qty 1

## 2022-02-09 NOTE — Progress Notes (Signed)
PROGRESS NOTE    Antonio Clark  FAO:[id number] DOB: [birth year] DOA: 02/08/2022 PCP: Susy Frizzle, MD    Brief Narrative:   86 y.o. Caucasian male with medical history significant for GERD, hypertension, dyslipidemia, urolithiasis, OSA on CPAP, and atrial fibrillation on Pradaxa, who presented to the emergency room with acute onset of worsening dyspnea with associated dry cough occasionally productive of clear sputum with occasional blood tinges as well as wheezing for the last 5 to 6 days.  He denied any fever or chills.  No dysuria, oliguria or hematuria or flank pain.  No nausea or vomiting or abdominal pain.  Assessment & Plan:   Principal Problem:   Acute CHF (congestive heart failure) (HCC) Active Problems:   Hypertensive urgency   Paroxysmal atrial fibrillation with rapid ventricular response (HCC)   Hyperlipidemia   Coronary artery disease   Type 2 diabetes mellitus with complication, without long-term current use of insulin (HCC)  Suspected new onset congestive heart failure Bilateral pleural effusions Volume overload Hypokalemia Suspect diastolic related to hypertensive urgency Plan: Continue IV furosemide 40 mg twice daily Follow-up 2D echocardiogram Cardiology consulted, recommendations appreciated Strict ins and outs Daily weights Monitor and replace potassium as necessary, goal for  Elevated troponin Not consistent with ACS No indication for heparin Follow-up 2D echocardiogram PTA Pradaxa  Hypertensive urgency Possible underlying etiology of congestive heart failure Improved Continue Lopressor As needed IV labetalol Diuresis as above  Persistent atrial fibrillation with rapid ventricular response Rate control improved Continue Lopressor Continue Pradaxa Telemetry monitoring  Type 2 diabetes mellitus without insulin use Continue home Tradjenta SSI x1 day  Coronary artery disease Hyperlipidemia PTA beta-blocker and statin     DVT  prophylaxis: Pradaxa Code Status: Full Family Communication: Disposition Plan: Status is: Inpatient Remains inpatient appropriate because: Volume overload, suspected new onset heart failure on IV diuresis   Level of care: Telemetry Cardiac  Consultants:  Cardiology-[facility]  Procedures:  None  Antimicrobials: None   Subjective: Seen and examined.  Endorses improvement in respiratory status since admission.  No pain complaints  Objective: Vitals:   02/09/22 0530 02/09/22 0751 02/09/22 1221 02/09/22 1248  BP: (!) 147/90 (!) 151/109 (!) 126/93 134/89  Pulse: 98 (!) 103  99  Resp: '20 20  15  '$ Temp:  97.6 F (36.4 C) 98 F (36.7 C) 97.7 F (36.5 C)  TempSrc:  Oral Oral Oral  SpO2: 96% 97%  98%  Weight:      Height:        Intake/Output Summary (Last 24 hours) at 02/09/2022 1305 Last data filed at 02/09/2022 0810 Gross per 24 hour  Intake --  Output 125 ml  Net -125 ml   Filed Weights   02/08/22 1614  Weight: 82.1 kg    Examination:  General exam: No acute distress Respiratory system: Scattered crackles bilaterally.  Normal work of breathing.  Room air Cardiovascular system: S1-S2, RRR, no murmurs, trace pedal edema Gastrointestinal system: Soft, NT/ND, normal bowel sounds Central nervous system: Alert and oriented. No focal neurological deficits. Extremities: Symmetric 5 x 5 power. Skin: No rashes, lesions or ulcers Psychiatry: Judgement and insight appear normal. Mood & affect appropriate.     Data Reviewed: I have personally reviewed following labs and imaging studies  CBC: Recent Labs  Lab 02/08/22 1617 02/09/22 0458  WBC 6.7 8.6  HGB 12.0* 13.1  HCT 38.6* 41.7  MCV 91.0 90.5  PLT 149* 696   Basic Metabolic Panel: Recent Labs  Lab 02/08/22 1617 02/09/22  0458  NA 142 142  K 3.6 3.1*  CL 109 109  CO2 24 26  GLUCOSE 173* 128*  BUN 18 19  CREATININE 1.00 1.28*  CALCIUM 9.4 9.6   GFR: Estimated Creatinine Clearance: 43 mL/min (A) (by C-G  formula based on SCr of 1.28 mg/dL (H)). Liver Function Tests: No results for input(s): "AST", "ALT", "ALKPHOS", "BILITOT", "PROT", "ALBUMIN" in the last 168 hours. No results for input(s): "LIPASE", "AMYLASE" in the last 168 hours. No results for input(s): "AMMONIA" in the last 168 hours. Coagulation Profile: No results for input(s): "INR", "PROTIME" in the last 168 hours. Cardiac Enzymes: No results for input(s): "CKTOTAL", "CKMB", "CKMBINDEX", "TROPONINI" in the last 168 hours. BNP (last 3 results) No results for input(s): "PROBNP" in the last 8760 hours. HbA1C: No results for input(s): "HGBA1C" in the last 72 hours. CBG: Recent Labs  Lab 02/08/22 2239 02/09/22 0749 02/09/22 1238  GLUCAP 146* 133* 145*   Lipid Profile: No results for input(s): "CHOL", "HDL", "LDLCALC", "TRIG", "CHOLHDL", "LDLDIRECT" in the last 72 hours. Thyroid Function Tests: Recent Labs    02/09/22 0458  TSH 3.207   Anemia Panel: No results for input(s): "VITAMINB12", "FOLATE", "FERRITIN", "TIBC", "IRON", "RETICCTPCT" in the last 72 hours. Sepsis Labs: No results for input(s): "PROCALCITON", "LATICACIDVEN" in the last 168 hours.  Recent Results (from the past 240 hour(s))  SARS Coronavirus 2 by RT PCR (hospital order, performed in [facility] hospital lab) *cepheid single result test* Anterior Nasal Swab     Status: None   Collection Time: 02/08/22  5:22 PM   Specimen: Anterior Nasal Swab  Result Value Ref Range Status   SARS Coronavirus 2 by RT PCR NEGATIVE NEGATIVE Final    Comment: (NOTE) SARS-CoV-2 target nucleic acids are NOT DETECTED.  The SARS-CoV-2 RNA is generally detectable in upper and lower respiratory specimens during the acute phase of infection. The lowest concentration of SARS-CoV-2 viral copies this assay can detect is 250 copies / mL. A negative result does not preclude SARS-CoV-2 infection and should not be used as the sole basis for treatment or other patient management  decisions.  A negative result may occur with improper specimen collection / handling, submission of specimen other than nasopharyngeal swab, presence of viral mutation(s) within the areas targeted by this assay, and inadequate number of viral copies (<250 copies / mL). A negative result must be combined with clinical observations, patient history, and epidemiological information.  Fact Sheet for Patients:   https://www.patel.info/  Fact Sheet for Healthcare Providers: https://hall.com/  This test is not yet approved or  cleared by the [place] FDA and has been authorized for detection and/or diagnosis of SARS-CoV-2 by FDA under an Emergency Use Authorization (EUA).  This EUA will remain in effect (meaning this test can be used) for the duration of the COVID-19 declaration under Section 564(b)(1) of the Act, 21 U.S.C. section 360bbb-3(b)(1), unless the authorization is terminated or revoked sooner.  Performed at [facility], [address] [place], [place] [address]          Radiology Studies: CT Angio Chest PE W and/or Wo Contrast  Result Date: 02/08/2022 CLINICAL DATA:  Pulmonary embolism (PE) suspected, high prob EXAM: CT ANGIOGRAPHY CHEST WITH CONTRAST TECHNIQUE: Multidetector CT imaging of the chest was performed using the standard protocol during bolus administration of intravenous contrast. Multiplanar CT image reconstructions and MIPs were obtained to evaluate the vascular anatomy. RADIATION DOSE REDUCTION: This exam was performed according to the departmental dose-optimization program  which includes automated exposure control, adjustment of the mA and/or kV according to patient size and/or use of iterative reconstruction technique. CONTRAST:  69m OMNIPAQUE IOHEXOL 350 MG/ML SOLN COMPARISON:  None Available. FINDINGS: Cardiovascular: Satisfactory opacification of the pulmonary arteries to the segmental level. No  evidence of pulmonary embolism. The main pulmonary artery measures 3.1 cm. Prominent heart size. No significant pericardial effusion. The thoracic aorta is normal in caliber. No atherosclerotic plaque of the thoracic aorta. No coronary artery calcifications. Mediastinum/Nodes: No enlarged mediastinal, hilar, or axillary lymph nodes. Thyroid gland, trachea, and esophagus demonstrate no significant findings. Lungs/Pleura: Expiratory phase of respiration with mosaic attenuation of the lungs. No focal consolidation. No pulmonary nodule. No pulmonary mass. Bilateral small to moderate, right greater than left, pleural effusions. No pneumothorax. Diffuse mild bronchial wall thickening. Upper Abdomen: No acute abnormality. Musculoskeletal: No chest wall abnormality. No suspicious lytic or blastic osseous lesions. No acute displaced fracture. Multilevel degenerative changes of the spine. Review of the MIP images confirms the above findings. IMPRESSION: 1. Bilateral small to moderate, right greater than left, pleural effusions. 2. Bronchial wall thickening suggestive of small airway disease. 3. Cardiomegaly. Electronically Signed   By: MIven FinnM.D.   On: 02/08/2022 18:44   DG Chest 2 View  Result Date: 02/08/2022 CLINICAL DATA:  sob EXAM: CHEST - 2 VIEW COMPARISON:  None Available. FINDINGS: The heart and mediastinal contours are unchanged. Prominent hilar vasculature. No focal consolidation. Increased interstitial markings. Bilateral trace pleural effusion. No pneumothorax. No acute osseous abnormality.  Right chest wall vascular clips. IMPRESSION: Pulmonary edema.  Bilateral trace pleural effusions. Electronically Signed   By: MIven FinnM.D.   On: 02/08/2022 17:00        Scheduled Meds:  atorvastatin  10 mg Oral Daily   dabigatran  150 mg Oral BID   diclofenac Sodium  2 g Topical QID   finasteride  5 mg Oral Daily   furosemide  40 mg Intravenous Q12H   insulin aspart  0-9 Units Subcutaneous TID  AC & HS   linagliptin  5 mg Oral Daily   loratadine  10 mg Oral Daily   metoprolol tartrate  50 mg Oral BID   mupirocin ointment  1 Application Topical BID   potassium chloride  40 mEq Oral Q2H   Continuous Infusions:   LOS: 1 day      SSidney Ace MD [facility]   If 7PM-7AM, please contact night-coverage  02/09/2022, 1:05 PM

## 2022-02-09 NOTE — Assessment & Plan Note (Signed)
-   We will continue Pradaxa and Lopressor.

## 2022-02-09 NOTE — Plan of Care (Signed)
?  Problem: Education: ?Goal: Ability to demonstrate management of disease process will improve ?Outcome: Progressing ?  ?Problem: Activity: ?Goal: Capacity to carry out activities will improve ?Outcome: Progressing ?  ?Problem: Cardiac: ?Goal: Ability to achieve and maintain adequate cardiopulmonary perfusion will improve ?Outcome: Progressing ?  ?

## 2022-02-09 NOTE — Consult Note (Signed)
Cardiology Consultation:   Patient ID: Antonio Clark; [id number]; [birth year]   Admit date: 02/08/2022 Date of Consult: 02/09/2022  Primary Care Provider: Susy Frizzle, MD Primary Cardiologist: Marlou Porch Primary Electrophysiologist:  Quentin Ore   Patient Profile:   Antonio Clark is a 86 y.o. male with a hx of Persistent A-fib status post ablation in 04/2020 with recurrent A-fib status post DCCV in 05/2020 on Pradaxa, DM 2, HTN, HLD, strong family history of CAD, melanoma, prostate cancer, and sleep apnea on CPAP who is being seen today for the evaluation of volume overload and A-fib with RVR at the request of Dr. Sidney Ace.  History of Present Illness:   Mr. Delsol underwent remote Cardiolite in 2013 was normal.  He was found to have A-fib in 2019, initially noted in the urology office.  Echo in 10/2017 demonstrated an EF of 50 to 55%, no regional wall motion normalities, grade 2 diastolic dysfunction, mildly calcified aortic valve leaflets with mild insufficiency, mildly dilated aortic root, trivial mitral regurgitation, moderately dilated left atrium, mildly dilated RV.  His recent echo from 04/2020 demonstrated an EF of 50 to 55%, indeterminate LV diastolic function parameters, normal RV systolic function and ventricular cavity size, normal PASP, mildly dilated left atrium, trivial mitral regurgitation, trivial aortic insufficiency, mild aortic valve sclerosis without evidence of stenosis, and a mildly dilated ascending aorta.  He was subsequently evaluated by EP and underwent A-fib ablation in 04/2020 with recurrent A-fib status post DCCV in 05/2020 requiring 3 shocks.   He was most recently seen in the office on 12/16/2021 and indicates he really did not feel any different following his DCCV.  He was noted to be back in A-fib with controlled ventricular response (was in sinus at his A-fib clinic visit in 05/2021).  Given lack of symptoms, and age, rate control strategy was recommended.  He presented  to [facility] ED on 02/08/2022 with a 2-week history of shortness of breath and dry cough.  No chest pain, palpitations, dizziness, presyncope, or syncope.  He notes chronic stable lower extremity swelling with the right lower extremity worse than the left at baseline.  No significant weight gain.  No fevers or chills.  He has never been able to feel his A-fib.  He reports adherence and tolerance to all medications, including anticoagulation and denies missing any doses.  He has had 3-4 falls recently which all started after he was stuck upside down for approximately 1 hour on his inversion table.  Upon arrival to the ED he was noted to be tachycardic with heart rates in the low 100s, hypertensive with blood pressure in the 884Z to 660Y systolic, oxygen saturation 97% on room air subsequently briefly placed on supplemental oxygen via nasal cannula at 2 L.  Afebrile.   Labs notable for a BNP of 1751, high-sensitivity troponin 34 with a delta troponin of 35, potassium 3.1, BUN 19, serum creatinine 1.28.  Chest x-ray showed pulmonary edema with bilateral trace pleural effusions.  CTA chest was negative for PE with bilateral small to moderate right greater than left pleural effusions, bronchial wall thickening suggestive of small airway disease, and cardiomegaly.  EKG showed A-fib with RVR with aberrancy, 113 bpm, baseline wandering, and nonspecific ST-T changes.   In the ED he received IV Lasix 40 mg, DuoNeb, metoprolol, IV labetalol, and insulin.  He has been continued on PTA Pradaxa.  Upon admission, he was placed on Lasix 40 mg twice daily.  Since ED presentation, he notes an  improvement in his dyspnea with IV diuresis.  He is without chest pain, palpitations, orthopnea, PND, dizziness, presyncope, or syncope.  He notes his chronic lower extremity swelling (right greater than left at baseline) is stable.  He has been voiding in the toilet and flushing, and does note vigorous urine output with IV Lasix.    Past  Medical History:  Diagnosis Date   Anal fissure    Arthritis    Atrial fibrillation ([place])    Back pain    Colon polyps    Diabetes mellitus type 2 with complications ([place])    Dysrhythmia    a-fib   GERD (gastroesophageal reflux disease)    History of echocardiogram 07/ 07/ 2011   History of lithotripsy 1989   Hyperlipidemia    Hypertension    Kidney stones    Melanoma ([place]) 05/01/2019   right chest wall (12/20)   Prostate CA ([place]) 2010   Sleep apnea    uses CPAP nightly   Squamous cell carcinoma of skin 05/23/1992   bowens-left parietal scalp (CX35FU)   Squamous cell carcinoma of skin 03/14/2008   in situ-left upper outer forehead-medial (CX35FU)   Squamous cell carcinoma of skin 03/14/2008   in situ-crown of scalp (Cx35FU)   Squamous cell carcinoma of skin 04/15/2011   in situ-right dorsal forearm (txpbx)   Squamous cell carcinoma of skin 10/16/2011   in situ-left sideburn   Squamous cell carcinoma of skin 03/11/2015   ka-left sideburn (CX35FU)   Squamous cell carcinoma of skin 03/11/2015   ka-left forearm (CX35FU)   Squamous cell carcinoma of skin 06/22/2018   in situ-left forearm, sup (txpbx)   Squamous cell carcinoma of skin 05/01/2019   in situ-mid anterior scalp    Squamous cell carcinoma of skin 05/01/2019   in situ-right upper arm    Past Surgical History:  Procedure Laterality Date   ATRIAL FIBRILLATION ABLATION N/A 04/17/2020   Procedure: [place];  Surgeon: Vickie Epley, MD;  Location: [place] [facility];  Service: Cardiovascular;  Laterality: N/A;   AXILLARY SENTINEL NODE BIOPSY Right 08/23/2019   Procedure: SENTINEL LYMPH NODE BIOSPY RIGHT AXILLA;  Surgeon: Stark Klein, MD;  Location: [place];  Service: General;  Laterality: Right;   CARDIOVERSION N/A 05/10/2020   Procedure: CARDIOVERSION;  Surgeon: Freada Bergeron, MD;  Location: [facility] ENDOSCOPY;  Service: Cardiovascular;  Laterality: N/A;   CARDIOVERSION N/A  05/08/2021   Procedure: CARDIOVERSION;  Surgeon: Donato Heinz, MD;  Location: [facility] ENDOSCOPY;  Service: Cardiovascular;  Laterality: N/A;   COLONOSCOPY  2002   FASCIECTOMY Left 03/28/2019   Procedure: SEGMENTAL FASCIECTOMY LEFT RING FINGER;  Surgeon: Daryll Brod, MD;  Location: [place];  Service: Orthopedics;  Laterality: Left;  ANESTHESIA  AXILLARY BLOCK   INSERTION PROSTATE RADIATION SEED  8 4 2010   per Dr Rosana Hoes   MELANOMA EXCISION Right 06/22/2019   Procedure: WIDE LOCAL EXCISION RIGHT CHEST WALL MELANOMA, ADVANCEMENT FLAP CLOSURE FOR DEFECT 3X6 CM;  Surgeon: Stark Klein, MD;  Location: [place];  Service: General;  Laterality: Right;   POLYPECTOMY  2002   THROAT SURGERY  1980   benign cyst   UPPER GASTROINTESTINAL ENDOSCOPY     URETHRAL STRICTURE DILATATION     also penile implant     Home Meds: Prior to Admission medications   Medication Sig Start Date End Date Taking? Authorizing Provider  atorvastatin (LIPITOR) 10 MG tablet Take 10 mg by mouth daily.  Yes [provider]  budesonide-formoterol (SYMBICORT) 160-4.5 MCG/ACT inhaler Inhale 2 puffs into the lungs 2 (two) times daily. 01/20/22  Yes Susy Frizzle, MD  finasteride (PROSCAR) 5 MG tablet Take 5 mg by mouth daily. 08/22/21  Yes [provider]  linagliptin (TRADJENTA) 5 MG TABS tablet Take 1 tablet (5 mg total) by mouth daily. 10/06/21  Yes Susy Frizzle, MD  LORazepam (ATIVAN) 0.5 MG tablet Take 1 tablet (0.5 mg total) by mouth 2 (two) times daily as needed for anxiety. 01/20/22  Yes Susy Frizzle, MD  metoprolol tartrate (LOPRESSOR) 25 MG tablet Take 1 tablet (25 mg total) by mouth 2 (two) times daily. 05/16/21  Yes Jerline Pain, MD  PRADAXA 150 MG CAPS capsule TAKE 1 CAPSULE BY MOUTH TWICE A DAY 12/15/21  Yes Jerline Pain, MD  cetirizine (ZYRTEC) 10 MG tablet TAKE 1 TABLET BY MOUTH EVERY DAY. NOT COVERED BY INSURANCE. 12/16/21   Susy Frizzle, MD   diclofenac Sodium (VOLTAREN) 1 % GEL Apply 2 g topically 4 (four) times daily. 12/29/21   Susy Frizzle, MD  fluticasone (FLONASE) 50 MCG/ACT nasal spray Place 1 spray into both nostrils daily as needed for allergies or rhinitis.    [provider]  mupirocin ointment (BACTROBAN) 2 % Apply 1 Application topically 2 (two) times daily. 02/03/22   Lavonna Monarch, MD    Inpatient Medications: Scheduled Meds:  atorvastatin  10 mg Oral Daily   dabigatran  150 mg Oral BID   diclofenac Sodium  2 g Topical QID   finasteride  5 mg Oral Daily   furosemide  40 mg Intravenous Q12H   insulin aspart  0-9 Units Subcutaneous TID AC & HS   linagliptin  5 mg Oral Daily   loratadine  10 mg Oral Daily   metoprolol tartrate  25 mg Oral BID   mupirocin ointment  1 Application Topical BID   Continuous Infusions:  PRN Meds: acetaminophen **OR** acetaminophen, fluticasone, LORazepam, magnesium hydroxide, ondansetron **OR** ondansetron (ZOFRAN) IV, traZODone  Allergies:  No Known Allergies  Social History:   Social History   Socioeconomic History   Marital status: Single    Spouse name: Not on file   Number of children: 0   Years of education: Not on file   Highest education level: Not on file  Occupational History   Occupation: retired  Tobacco Use   Smoking status: Never   Smokeless tobacco: Never  Vaping Use   Vaping Use: Never used  Substance and Sexual Activity   Alcohol use: Not Currently    Comment: social   Drug use: No   Sexual activity: Yes  Other Topics Concern   Not on file  Social History Narrative   Not on file   Social Determinants of Health   Financial Resource Strain: Low Risk  (10/30/2021)   Overall Financial Resource Strain (CARDIA)    Difficulty of Paying Living Expenses: Not hard at all  Food Insecurity: No Food Insecurity (10/30/2021)   Hunger Vital Sign    Worried About Running Out of Food in the Last Year: Never true    Ran Out of Food in the Last  Year: Never true  Transportation Needs: No Transportation Needs (10/30/2021)   PRAPARE - Hydrologist (Medical): No    Lack of Transportation (Non-Medical): No  Physical Activity: Inactive (10/30/2021)   Exercise Vital Sign    Days of Exercise per Week: 0 days  Minutes of Exercise per Session: 0 min  Stress: No Stress Concern Present (10/30/2021)   [place]    Feeling of Stress : Not at all  Social Connections: Moderately Integrated (10/30/2021)   Social Connection and Isolation Panel [NHANES]    Frequency of Communication with Friends and Family: More than three times a week    Frequency of Social Gatherings with Friends and Family: More than three times a week    Attends Religious Services: More than 4 times per year    Active Member of [employer] or Organizations: Yes    Attends Music therapist: More than 4 times per year    Marital Status: Never married  Intimate Partner Violence: Not At Risk (10/30/2021)   Humiliation, Afraid, Rape, and Kick questionnaire    Fear of Current or Ex-Partner: No    Emotionally Abused: No    Physically Abused: No    Sexually Abused: No     Family History:   Family History  Problem Relation Age of Onset   Coronary artery disease Brother        3 brothers had CABG   Arrhythmia Brother    Diabetes Brother    Hearing loss Brother    Hypertension Brother    Heart disease Brother    Heart failure Brother    Hypertension Brother    Heart disease Father    Hypertension Father    Sudden death Father    Stroke Mother        cerebral hemorrage   Colon cancer Mother 42       small intestine   Depression Mother    Stroke Sister    Suicidality Sister    Diabetes Brother    Hypertension Brother    Hyperlipidemia Sister    Esophageal cancer Neg Hx    Pancreatic cancer Neg Hx    Prostate cancer Neg Hx    Rectal cancer Neg Hx    Stomach cancer  Neg Hx     ROS:  Review of Systems  Constitutional:  Positive for malaise/fatigue. Negative for chills, diaphoresis, fever and weight loss.  HENT:  Negative for congestion.   Eyes:  Negative for discharge and redness.  Respiratory:  Positive for shortness of breath. Negative for cough, sputum production and wheezing.   Cardiovascular:  Positive for leg swelling. Negative for chest pain, palpitations, orthopnea, claudication and PND.  Gastrointestinal:  Negative for abdominal pain, blood in stool, heartburn, melena, nausea and vomiting.  Musculoskeletal:  Positive for falls. Negative for myalgias.  Skin:  Negative for rash.  Neurological:  Negative for dizziness, tingling, tremors, sensory change, speech change, focal weakness, loss of consciousness and weakness.  Endo/Heme/Allergies:  Does not bruise/bleed easily.  Psychiatric/Behavioral:  Negative for substance abuse. The patient is not nervous/anxious.   All other systems reviewed and are negative.     Physical Exam/Data:   Vitals:   02/08/22 2234 02/08/22 2300 02/09/22 0309 02/09/22 0530  BP: (!) 149/83 (!) 137/98 (!) 142/87 (!) 147/90  Pulse: (!) 121 (!) 102 99 98  Resp: '19 20 17 20  '$ Temp:  98.1 F (36.7 C) 97.9 F (36.6 C)   TempSrc:  Oral Oral   SpO2: 98% 94% 96% 96%  Weight:      Height:       No intake or output data in the 24 hours ending 02/09/22 0743 Filed Weights   02/08/22 1614  Weight: 82.1 kg  Body mass index is 26.73 kg/m.   Physical Exam: General: Well developed, well nourished, in no acute distress. Head: Normocephalic, atraumatic, sclera non-icteric, no xanthomas, nares without discharge.  Neck: Negative for carotid bruits. JVD not elevated. Lungs: Diminished breath sounds along the bilateral bases with faint expiratory wheezing bilaterally. Breathing is unlabored. Heart: Irregularly irregular with S1 S2. No murmurs, rubs, or gallops appreciated. Abdomen: Soft, non-tender, non-distended with  normoactive bowel sounds. No hepatomegaly. No rebound/guarding. No obvious abdominal masses. Msk:  Strength and tone appear normal for age. Extremities: No clubbing or cyanosis.  Trivial bilateral pretibial edema with the right lower extremity being slightly worse than the left. Distal pedal pulses are 2+ and equal bilaterally. Neuro: Alert and oriented X 3. No facial asymmetry. No focal deficit. Moves all extremities spontaneously. Psych:  Responds to questions appropriately with a normal affect.   EKG:  The EKG was personally reviewed and demonstrates: A-fib with RVR with aberrancy, 113 bpm, baseline wandering, and nonspecific ST-T changes Telemetry:  Telemetry was personally reviewed and demonstrates: A-fib with aberrancy, ventricular rates in the 90s to low 100s bpm  Weights: Filed Weights   02/08/22 1614  Weight: 82.1 kg    Relevant CV Studies:  2D echo 04/10/2020: 1. Inferior basal hypokinesis. Left ventricular ejection fraction, by  estimation, is 50 to 55%. The left ventricle has low normal function. The  left ventricle demonstrates regional wall motion abnormalities (see  scoring diagram/findings for  description). Left ventricular diastolic parameters are indeterminate.   2. Right ventricular systolic function is normal. The right ventricular  size is normal. There is normal pulmonary artery systolic pressure.   3. Left atrial size was mildly dilated.   4. The mitral valve is normal in structure. Trivial mitral valve  regurgitation. No evidence of mitral stenosis.   5. The aortic valve is tricuspid. Aortic valve regurgitation is trivial.  Mild aortic valve sclerosis is present, with no evidence of aortic valve  stenosis.   6. Aortic dilatation noted. There is mild dilatation of the ascending  aorta measuring 39 mm.   7. The inferior vena cava is normal in size with greater than 50%  respiratory variability, suggesting right atrial pressure of 3 mmHg. __________  2D echo  11/05/2017: - Left ventricle: The cavity size was normal. There was mild focal    basal hypertrophy of the septum. Systolic function was normal.    The estimated ejection fraction was in the range of 50% to 55%.    Wall motion was normal; there were no regional wall motion    abnormalities. Features are consistent with a pseudonormal left    ventricular filling pattern, with concomitant abnormal relaxation    and increased filling pressure (grade 2 diastolic dysfunction).  - Aortic valve: Trileaflet; moderately thickened, mildly calcified    leaflets. There was mild regurgitation. Regurgitation pressure    half-time: 701 ms.  - Aorta: Aortic root dimension: 38 mm (ED).  - Aortic root: The aortic root was mildly dilated.  - Mitral valve: Calcified annulus. There was trivial regurgitation.  - Left atrium: The atrium was moderately dilated.  - Right ventricle: The cavity size was mildly dilated. Wall    thickness was normal.  - Atrial septum: There was increased thickness of the septum,    consistent with lipomatous hypertrophy.  - Tricuspid valve: There was trivial regurgitation.  - Pulmonic valve: There was trivial regurgitation.  Laboratory Data:  Chemistry Recent Labs  Lab 02/08/22 1617 02/09/22 0458  NA 142  142  K 3.6 3.1*  CL 109 109  CO2 24 26  GLUCOSE 173* 128*  BUN 18 19  CREATININE 1.00 1.28*  CALCIUM 9.4 9.6  GFRNONAA >60 55*  ANIONGAP 9 7    No results for input(s): "PROT", "ALBUMIN", "AST", "ALT", "ALKPHOS", "BILITOT" in the last 168 hours. Hematology Recent Labs  Lab 02/08/22 1617 02/09/22 0458  WBC 6.7 8.6  RBC 4.24 4.61  HGB 12.0* 13.1  HCT 38.6* 41.7  MCV 91.0 90.5  MCH 28.3 28.4  MCHC 31.1 31.4  RDW 15.6* 15.7*  PLT 149* 187   Cardiac EnzymesNo results for input(s): "TROPONINI" in the last 168 hours. No results for input(s): "TROPIPOC" in the last 168 hours.  BNP Recent Labs  Lab 02/08/22 1627  BNP 1,751.8*    DDimer No results for  input(s): "DDIMER" in the last 168 hours.  Radiology/Studies:  CT Angio Chest PE W and/or Wo Contrast  Result Date: 02/08/2022 IMPRESSION: 1. Bilateral small to moderate, right greater than left, pleural effusions. 2. Bronchial wall thickening suggestive of small airway disease. 3. Cardiomegaly. Electronically Signed   By: Iven Finn M.D.   On: 02/08/2022 18:44   DG Chest 2 View  Result Date: 02/08/2022 IMPRESSION: Pulmonary edema.  Bilateral trace pleural effusions. Electronically Signed   By: Iven Finn M.D.   On: 02/08/2022 17:00    Assessment and Plan:   1.  Persistent A-fib with RVR: -Likely contributing to his volume overload -No palpitations -At his last office visit in 12/2021, rate control strategy was recommended -Titrate metoprolol to 50 mg twice daily -CHA2DS2-VASc at least 4 (HTN, age x 2, DM) -PTA Pradaxa -Replete potassium to goal 4.0 -Check TSH  2.  Bilateral pleural effusions/volume overload: -Likely exacerbated by persistent A-fib with RVR -Agree with IV Lasix 40 mg twice daily with recommendation to maintain a net negative urine output of approximately 2 L/day -Await echo with further recommendations pending -Escalate GDMT as indicated -Strict I's and O's -Daily standing scale weights -CHF education  3.  Elevated high-sensitivity troponin: -Minimally elevated and flat trending, not consistent with ACS -Defer heparin drip -Await echo -No coronary artery calcification noted on CT imaging this admission -PTA Pradaxa in place of aspirin to minimize bleeding risk -Risk factor modification  4.  HTN: -Blood pressure remains elevated -Titrate metoprolol as outlined above -IV Lasix as above  5.  Hypokalemia: -Replete potassium to goal 4.0, repletion ordered     For questions or updates, please contact [place] Please consult [email or web address] for contact info under Cardiology/STEMI.   Signed, Christell Faith, PA-C [place] Pager: [phone] 02/09/2022, 7:43 AM

## 2022-02-09 NOTE — Progress Notes (Signed)
ANTICOAGULATION CONSULT NOTE - Initial Consult  Pharmacy Consult for Heparin Drip Indication: atrial fibrillation  No Known Allergies  Patient Measurements: Height: '5\' 9"'$  (175.3 cm) Weight: 82.1 kg (181 lb) IBW/kg (Calculated) : 70.7 Heparin Dosing Weight: 82.1 kg  Vital Signs: Temp: 97.7 F (36.5 C) (07/03 1248) Temp Source: Oral (07/03 1248) BP: 134/89 (07/03 1551) Pulse Rate: 101 (07/03 1551)  Labs: Recent Labs    02/08/22 1617 02/08/22 1844 02/09/22 0458  HGB 12.0*  --  13.1  HCT 38.6*  --  41.7  PLT 149*  --  187  CREATININE 1.00  --  1.28*  TROPONINIHS 34* 35*  --     Estimated Creatinine Clearance: 43 mL/min (A) (by C-G formula based on SCr of 1.28 mg/dL (H)).   Medical History: Past Medical History:  Diagnosis Date   Anal fissure    Arthritis    Atrial fibrillation ([place])    Back pain    Colon polyps    Diabetes mellitus type 2 with complications ([place])    Dysrhythmia    a-fib   GERD (gastroesophageal reflux disease)    History of echocardiogram 07/ 07/ 2011   History of lithotripsy 1989   Hyperlipidemia    Hypertension    Kidney stones    Melanoma ([place]) 05/01/2019   right chest wall (12/20)   Prostate CA ([place]) 2010   Sleep apnea    uses CPAP nightly   Squamous cell carcinoma of skin 05/23/1992   bowens-left parietal scalp (CX35FU)   Squamous cell carcinoma of skin 03/14/2008   in situ-left upper outer forehead-medial (CX35FU)   Squamous cell carcinoma of skin 03/14/2008   in situ-crown of scalp ([id number])   Squamous cell carcinoma of skin 04/15/2011   in situ-right dorsal forearm (txpbx)   Squamous cell carcinoma of skin 10/16/2011   in situ-left sideburn   Squamous cell carcinoma of skin 03/11/2015   ka-left sideburn ([id number])   Squamous cell carcinoma of skin 03/11/2015   ka-left forearm ([id number])   Squamous cell carcinoma of skin 06/22/2018   in situ-left forearm, sup (txpbx)   Squamous cell carcinoma of skin 05/01/2019   in situ-mid  anterior scalp    Squamous cell carcinoma of skin 05/01/2019   in situ-right upper arm    Assessment: Patient is a 86yo male on Pradaxa '150mg'$  bid for afib. Patient may need a cath during admission so cardiology is stopping Pradaxa and switching patient to Heparin drip. Pharmacy consulted for Heparin dosing.  Last dose of Pradaxa 7/3 12:17  Goal of Therapy:  Heparin level 0.3-0.7 units/ml Monitor platelets by anticoagulation protocol: Yes   Plan:  Heparin 1200 units/hr to begin 7/4 at 00:00 (12 hours after last dose of Pradaxa) Heparin level 8hrs after start of infusion Daily CBC while on Heparin infusion  Paulina Fusi, PharmD, BCPS 02/09/2022 5:45 PM

## 2022-02-09 NOTE — Progress Notes (Signed)
Admission profile updated. ?

## 2022-02-10 DIAGNOSIS — R778 Other specified abnormalities of plasma proteins: Secondary | ICD-10-CM

## 2022-02-10 LAB — CBC
HCT: 37.7 % — ABNORMAL LOW (ref 39.0–52.0)
Hemoglobin: 12.2 g/dL — ABNORMAL LOW (ref 13.0–17.0)
MCH: 28.4 pg (ref 26.0–34.0)
MCHC: 32.4 g/dL (ref 30.0–36.0)
MCV: 87.9 fL (ref 80.0–100.0)
Platelets: 150 10*3/uL (ref 150–400)
RBC: 4.29 MIL/uL (ref 4.22–5.81)
RDW: 15.6 % — ABNORMAL HIGH (ref 11.5–15.5)
WBC: 6.6 10*3/uL (ref 4.0–10.5)
nRBC: 0 % (ref 0.0–0.2)

## 2022-02-10 LAB — BASIC METABOLIC PANEL
Anion gap: 9 (ref 5–15)
BUN: 21 mg/dL (ref 8–23)
CO2: 24 mmol/L (ref 22–32)
Calcium: 8.9 mg/dL (ref 8.9–10.3)
Chloride: 105 mmol/L (ref 98–111)
Creatinine, Ser: 1.15 mg/dL (ref 0.61–1.24)
GFR, Estimated: >60 mL/min (ref >60)
Glucose, Bld: 121 mg/dL — ABNORMAL HIGH (ref 70–99)
Potassium: 3.4 mmol/L — ABNORMAL LOW (ref 3.5–5.1)
Sodium: 138 mmol/L (ref 135–145)

## 2022-02-10 LAB — GLUCOSE, CAPILLARY
Glucose-Capillary: 116 mg/dL — ABNORMAL HIGH (ref 70–99)
Glucose-Capillary: 144 mg/dL — ABNORMAL HIGH (ref 70–99)
Glucose-Capillary: 121 mg/dL — ABNORMAL HIGH (ref 70–99)
Glucose-Capillary: 176 mg/dL — ABNORMAL HIGH (ref 70–99)

## 2022-02-10 LAB — MAGNESIUM: Magnesium: 1.6 mg/dL — ABNORMAL LOW (ref 1.7–2.4)

## 2022-02-10 LAB — HEPARIN LEVEL (UNFRACTIONATED)
Heparin Unfractionated: 0.53 IU/mL (ref 0.30–0.70)
Heparin Unfractionated: 0.61 IU/mL (ref 0.30–0.70)

## 2022-02-10 LAB — HEMOGLOBIN A1C
Hgb A1c MFr Bld: 6.4 % — ABNORMAL HIGH (ref 4.8–5.6)
Mean Plasma Glucose: 137 mg/dL

## 2022-02-10 MED ORDER — POTASSIUM CHLORIDE CRYS ER 20 MEQ PO TBCR
40.0000 meq | EXTENDED_RELEASE_TABLET | ORAL | Status: AC
  Administered 2022-02-10 (×3): 40 meq via ORAL
  Filled 2022-02-10 (×3): qty 2

## 2022-02-10 MED ORDER — SODIUM CHLORIDE 0.9% FLUSH
3.0000 mL | Freq: Two times a day (BID) | INTRAVENOUS | Status: DC
  Administered 2022-02-10: 3 mL via INTRAVENOUS

## 2022-02-10 MED ORDER — LOSARTAN POTASSIUM 25 MG PO TABS
12.5000 mg | ORAL_TABLET | Freq: Every day | ORAL | Status: DC
  Administered 2022-02-10 – 2022-02-11 (×2): 12.5 mg via ORAL
  Filled 2022-02-10 (×2): qty 1

## 2022-02-10 MED ORDER — SODIUM CHLORIDE 0.9 % IV SOLN
250.0000 mL | INTRAVENOUS | Status: DC | PRN
Start: 2022-02-10 — End: 2022-02-11

## 2022-02-10 MED ORDER — MAGNESIUM SULFATE 2 GM/50ML IV SOLN
2.0000 g | Freq: Once | INTRAVENOUS | Status: AC
  Administered 2022-02-10: 2 g via INTRAVENOUS
  Filled 2022-02-10: qty 50

## 2022-02-10 MED ORDER — SODIUM CHLORIDE 0.9% FLUSH: 3.0000 mL | INTRAVENOUS | Status: DC | PRN

## 2022-02-10 MED ORDER — ASPIRIN 81 MG PO CHEW
81.0000 mg | CHEWABLE_TABLET | ORAL | Status: AC
  Administered 2022-02-11: 81 mg via ORAL
  Filled 2022-02-10: qty 1

## 2022-02-10 MED ORDER — SODIUM CHLORIDE 0.9 % IV SOLN: INTRAVENOUS | Status: DC

## 2022-02-10 NOTE — Progress Notes (Signed)
ANTICOAGULATION CONSULT NOTE -   Pharmacy Consult for Heparin Drip Indication: atrial fibrillation  No Known Allergies  Patient Measurements: Height: '5\' 9"'$  (175.3 cm) Weight: 79.6 kg (175 lb 7.8 oz) IBW/kg (Calculated) : 70.7 Heparin Dosing Weight: 82.1 kg  Vital Signs: Temp: 97.9 F (36.6 C) (07/04 0723) BP: 155/94 (07/04 0723) Pulse Rate: 101 (07/04 0723)  Labs: Recent Labs    02/08/22 1617 02/08/22 1844 02/09/22 0458 02/10/22 0748  HGB 12.0*  --  13.1 12.2*  HCT 38.6*  --  41.7 37.7*  PLT 149*  --  187 150  HEPARINUNFRC  --   --   --  0.53  CREATININE 1.00  --  1.28*  --   TROPONINIHS 34* 35*  --   --      Estimated Creatinine Clearance: 43 mL/min (A) (by C-G formula based on SCr of 1.28 mg/dL (H)).   Medical History: Past Medical History:  Diagnosis Date   Anal fissure    Arthritis    Atrial fibrillation ([place])    Back pain    Colon polyps    Diabetes mellitus type 2 with complications ([place])    Dysrhythmia    a-fib   GERD (gastroesophageal reflux disease)    History of echocardiogram 07/ 07/ 2011   History of lithotripsy 1989   Hyperlipidemia    Hypertension    Kidney stones    Melanoma ([place]) 05/01/2019   right chest wall (12/20)   Prostate CA ([place]) 2010   Sleep apnea    uses CPAP nightly   Squamous cell carcinoma of skin 05/23/1992   bowens-left parietal scalp (CX35FU)   Squamous cell carcinoma of skin 03/14/2008   in situ-left upper outer forehead-medial (CX35FU)   Squamous cell carcinoma of skin 03/14/2008   in situ-crown of scalp ([id number])   Squamous cell carcinoma of skin 04/15/2011   in situ-right dorsal forearm (txpbx)   Squamous cell carcinoma of skin 10/16/2011   in situ-left sideburn   Squamous cell carcinoma of skin 03/11/2015   ka-left sideburn ([id number])   Squamous cell carcinoma of skin 03/11/2015   ka-left forearm (CX35FU)   Squamous cell carcinoma of skin 06/22/2018   in situ-left forearm, sup (txpbx)   Squamous cell  carcinoma of skin 05/01/2019   in situ-mid anterior scalp    Squamous cell carcinoma of skin 05/01/2019   in situ-right upper arm    Assessment: Patient is a 86yo male on Pradaxa '150mg'$  bid for afib. Patient may need a cath during admission so cardiology is stopping Pradaxa and switching patient to Heparin drip. Pharmacy consulted for Heparin dosing.  Last dose of Pradaxa 7/3 12:17  7/4  0748  HL 0.53 Therapeutic, cont 1200 units/hr  Goal of Therapy:  Heparin level 0.3-0.7 units/ml Monitor platelets by anticoagulation protocol: Yes   Plan:  7/4  0748  HL 0.53 Therapeutic, Continue Heparin 1200 units/hr Confirmatory Heparin level in 8hrs  Daily CBC while on Heparin infusion  Chinita Greenland PharmD Clinical Pharmacist 02/10/2022

## 2022-02-10 NOTE — Progress Notes (Signed)
ANTICOAGULATION CONSULT NOTE -   Pharmacy Consult for Heparin Drip Indication: atrial fibrillation  No Known Allergies  Patient Measurements: Height: '5\' 9"'$  (175.3 cm) Weight: 79.6 kg (175 lb 7.8 oz) IBW/kg (Calculated) : 70.7 Heparin Dosing Weight: 82.1 kg  Vital Signs: Temp: 98.3 F (36.8 C) (07/04 1630) Temp Source: Oral (07/04 1630) BP: 124/93 (07/04 1630) Pulse Rate: 99 (07/04 1630)  Labs: Recent Labs    02/08/22 1617 02/08/22 1844 02/09/22 0458 02/10/22 0748 02/10/22 1657  HGB 12.0*  --  13.1 12.2*  --   HCT 38.6*  --  41.7 37.7*  --   PLT 149*  --  187 150  --   HEPARINUNFRC  --   --   --  0.53 0.61  CREATININE 1.00  --  1.28* 1.15  --   TROPONINIHS 34* 35*  --   --   --      Estimated Creatinine Clearance: 47.8 mL/min (by C-G formula based on SCr of 1.15 mg/dL).   Medical History: Past Medical History:  Diagnosis Date   Anal fissure    Arthritis    Atrial fibrillation ([place])    Back pain    Colon polyps    Diabetes mellitus type 2 with complications ([place])    Dysrhythmia    a-fib   GERD (gastroesophageal reflux disease)    History of echocardiogram 07/ 07/ 2011   History of lithotripsy 1989   Hyperlipidemia    Hypertension    Kidney stones    Melanoma ([place]) 05/01/2019   right chest wall (12/20)   Prostate CA ([place]) 2010   Sleep apnea    uses CPAP nightly   Squamous cell carcinoma of skin 05/23/1992   bowens-left parietal scalp (CX35FU)   Squamous cell carcinoma of skin 03/14/2008   in situ-left upper outer forehead-medial ([id number])   Squamous cell carcinoma of skin 03/14/2008   in situ-crown of scalp ([id number])   Squamous cell carcinoma of skin 04/15/2011   in situ-right dorsal forearm (txpbx)   Squamous cell carcinoma of skin 10/16/2011   in situ-left sideburn   Squamous cell carcinoma of skin 03/11/2015   ka-left sideburn ([id number])   Squamous cell carcinoma of skin 03/11/2015   ka-left forearm ([id number])   Squamous cell carcinoma of skin  06/22/2018   in situ-left forearm, sup (txpbx)   Squamous cell carcinoma of skin 05/01/2019   in situ-mid anterior scalp    Squamous cell carcinoma of skin 05/01/2019   in situ-right upper arm  Heparin Dosing Weight: 82.1 kg  Assessment: Patient is a 86yo male on Pradaxa '150mg'$  bid for afib. Patient may need a cath during admission so cardiology is stopping Pradaxa and switching patient to Heparin drip. Pharmacy consulted for Heparin dosing.  Last dose of Pradaxa 7/3 12:17  Date Time aPTT/HL Rate/Comment 7/4  0748   0.53  Therapeutic, cont 1200 units/hr 7/4 1657 0.61  Therapeutic, 1200 un/hr  Goal of Therapy:  Heparin level 0.3-0.7 units/ml Monitor platelets by anticoagulation protocol: Yes   Plan:  Consecutively therapeutic now 0.61 on 7/04 1657.  Continue Heparin 1200 units/hr Change to daily Heparin level monitoring  CTM Daily CBC while on Heparin infusion  Lorna Dibble, PharmD, [place] Pharmacist 02/10/2022 5:33 PM

## 2022-02-10 NOTE — Progress Notes (Signed)
RN received a call from tele stating patient had 17 beat run of Vtach. On assessment, patient denied any chest pain or feeling SOB. Patient stated she was turning from one side to the other and feels fine. Will inform provider.

## 2022-02-10 NOTE — Consult Note (Signed)
   Heart Failure Nurse Navigator Note  HFrEF 25 to 30%.  Mildly dilated internal left ventricular cavity.  Moderate LVH.  Moderate biatrial enlargement.  Mild to moderate mitral vegetation.  Moderate tricuspid gravitation.  Elevated pulmonary artery systolic pressures.  Ejection fraction from echocardiogram and 2021 was 50 to 55%.  He presented from home with complaints of shortness of breath, dry cough and wheezing.  Blood pressure noted to be 156/110.  BNP 1751.  Chest x-ray revealed pulmonary edema.  EKG revealed atrial fibrillation with a rate of 113 bpm.  Comorbidities:  GERD Hypertension Hyperlipidemia Obstructive sleep apnea with compliant CPAP use Persistent atrial fibrillation on NOAC Anemia  Status post ablation in September 2021  Medications:  Atorvastatin 10 mg daily Furosemide 40 mg IV every 12 Losartan 12.5 mg daily Metoprolol tartrate 50 mg 2 times a day Potassium chloride 40 mEq as directed  Labs:  Sodium 138, potassium 3.4, chloride 105, CO2 24, BUN 21, creatinine 1.15, GFR greater than 60. Weight is 79.6 kg Blood pressure 129/86  Meeting with patient, who was lying quietly in bed in no acute distress.  States that his breathing has improved and he continues to put out large amounts of pale-colored urine.  Discussed heart failure and what it means.  He states the physicians have told him that his heart function is half of what it was approximately 2 years ago.  Patient lives by himself but states he goes out to eat with his girlfriend.  He states he loves his salt and has continued to use salt.  Explained the reasoning behind removing the saltshaker from the table and making wiser choices when eating at restaurants.  He was given a living with heart failure teaching booklet and pointed out the section on restaurant eating.  He voices understanding and realizes that he will have to make this change.  Went over the importance of sticking with the fluid restriction  of 64 ounces which includes all liquids.  Also discussed daily weights and what to report.  He states prior to this last year he was walking 3 miles a day without any difficulty but just does not have the energy to do that this last year.  Cardiologist was and and discussed with him meeting with right and left heart catheterization tomorrow.  Made aware that he has follow-up in the outpatient heart failure clinic on July 21 at 9 AM.  He has a 1% no-show ratio which is 1 out of 122 appointments.  He was given the living with heart failure teaching booklet, zone magnet, information on low-sodium and heart failure along with weight chart.  He had no further questions at this time and we will continue to follow along.  Pricilla Riffle RN [facility]

## 2022-02-10 NOTE — Progress Notes (Signed)
PROGRESS NOTE    Antonio Clark  DGL:[id number] DOB: 06/05/1936 DOA: 02/08/2022 PCP: Susy Frizzle, MD    Brief Narrative:   86 y.o. Caucasian male with medical history significant for GERD, hypertension, dyslipidemia, urolithiasis, OSA on CPAP, and atrial fibrillation on Pradaxa, who presented to the emergency room with acute onset of worsening dyspnea with associated dry cough occasionally productive of clear sputum with occasional blood tinges as well as wheezing for the last 5 to 6 days.  He denied any fever or chills.  No dysuria, oliguria or hematuria or flank pain.  No nausea or vomiting or abdominal pain.  7/4: Patient endorses improvement in shortness of breath.  Denies chest pain.  UOP does not appear to be accurately documented.  Repeat ejection fraction 25 to 30%, down significantly from 55 to 60% from prior echocardiogram.  Cardiology following.  Planning inpatient ischemic evaluation.  Assessment & Plan:   Principal Problem:   Acute CHF (congestive heart failure) (HCC) Active Problems:   Hypertensive urgency   Paroxysmal atrial fibrillation with rapid ventricular response (HCC)   Hyperlipidemia   Coronary artery disease   Type 2 diabetes mellitus with complication, without long-term current use of insulin ([place])  New onset decompensated systolic congestive heart failure Bilateral pleural effusions Volume overload Hypokalemia Started on IV diuresis on admission  Cardiology [facility] has been consulted  Repeat echocardiogram 25 to 30% down from prior 55 to 50%  Plan:  Continue IV furosemide 40 mg twice daily Cardiology consulted, recommendations appreciated Strict ins and outs Daily weights Monitor and replace potassium and magnesium aggressively Low-dose losartan added per cardiology Outpatient plan to switch to [facility] as BP allows Plan for left and right heart catheterization 7/5 with possible PCI Home Pradaxa held and on IV heparin in preparation for  procedure  Elevated troponin Not consistent with ACS On heparin GTT as above Cardiac catheterization as above  Hypertensive urgency Possible underlying etiology of congestive heart failure Improved Continue Lopressor Low-dose losartan added 7/4 As needed IV labetalol Diuresis as above  Persistent atrial fibrillation with rapid ventricular response Rate control improved Continue Lopressor Heparin GTT as above Pradaxa on hold Telemetry monitoring  Type 2 diabetes mellitus without insulin use Continue home Tradjenta SSI x1 day  Coronary artery disease Hyperlipidemia PTA beta-blocker and statin     DVT prophylaxis: Pradaxa Code Status: Full Family Communication: None Disposition Plan: Status is: Inpatient Remains inpatient appropriate because: Volume overload, new onset systolic congestive heart failure.  Plan for inpatient ischemic evaluation 7/5  Level of care: Telemetry Cardiac  Consultants:  Cardiology-[facility]  Procedures:  Plan left and right heart catheterization 7/5  Antimicrobials: None   Subjective: Seen and examined.  Dyspnea improved.  No chest pain.  No pain complaints  Objective: Vitals:   02/09/22 2359 02/10/22 0421 02/10/22 0723 02/10/22 1154  BP: 118/72 133/88 (!) 155/94 126/86  Pulse: 88 (!) 102 (!) 101 (!) 102  Resp: '19 20 18 18  '$ Temp: 98 F (36.7 C) 98.2 F (36.8 C) 97.9 F (36.6 C) 97.6 F (36.4 C)  TempSrc:    Oral  SpO2: 98% 97% 97% 98%  Weight:  79.6 kg    Height:        Intake/Output Summary (Last 24 hours) at 02/10/2022 1255 Last data filed at 02/10/2022 1224 Gross per 24 hour  Intake 285.81 ml  Output 650 ml  Net -364.19 ml   Filed Weights   02/08/22 1614 02/10/22 0421  Weight: 82.1 kg 79.6 kg  Examination:  General exam: No acute distress Respiratory system: Bibasilar crackles.  Normal work of breathing.  Room air Cardiovascular system: S1-S2, RRR, no murmurs, no pedal edema Gastrointestinal system: Soft, NT/ND,  normal bowel sounds Central nervous system: Alert and oriented. No focal neurological deficits. Extremities: Symmetric 5 x 5 power. Skin: No rashes, lesions or ulcers Psychiatry: Judgement and insight appear normal. Mood & affect appropriate.     Data Reviewed: I have personally reviewed following labs and imaging studies  CBC: Recent Labs  Lab 02/08/22 1617 02/09/22 0458 02/10/22 0748  WBC 6.7 8.6 6.6  HGB 12.0* 13.1 12.2*  HCT 38.6* 41.7 37.7*  MCV 91.0 90.5 87.9  PLT 149* 187 761   Basic Metabolic Panel: Recent Labs  Lab 02/08/22 1617 02/09/22 0458 02/10/22 0748  NA 142 142 138  K 3.6 3.1* 3.4*  CL 109 109 105  CO2 '24 26 24  '$ GLUCOSE 173* 128* 121*  BUN '18 19 21  '$ CREATININE 1.00 1.28* 1.15  CALCIUM 9.4 9.6 8.9  MG  --   --  1.6*   GFR: Estimated Creatinine Clearance: 47.8 mL/min (by C-G formula based on SCr of 1.15 mg/dL). Liver Function Tests: No results for input(s): "AST", "ALT", "ALKPHOS", "BILITOT", "PROT", "ALBUMIN" in the last 168 hours. No results for input(s): "LIPASE", "AMYLASE" in the last 168 hours. No results for input(s): "AMMONIA" in the last 168 hours. Coagulation Profile: No results for input(s): "INR", "PROTIME" in the last 168 hours. Cardiac Enzymes: No results for input(s): "CKTOTAL", "CKMB", "CKMBINDEX", "TROPONINI" in the last 168 hours. BNP (last 3 results) No results for input(s): "PROBNP" in the last 8760 hours. HbA1C: Recent Labs    02/09/22 0458  HGBA1C 6.4*   CBG: Recent Labs  Lab 02/09/22 1238 02/09/22 1818 02/09/22 2056 02/10/22 0725 02/10/22 1154  GLUCAP 145* 129* 134* 116* 144*   Lipid Profile: No results for input(s): "CHOL", "HDL", "LDLCALC", "TRIG", "CHOLHDL", "LDLDIRECT" in the last 72 hours. Thyroid Function Tests: Recent Labs    02/09/22 0458  TSH 3.207   Anemia Panel: No results for input(s): "VITAMINB12", "FOLATE", "FERRITIN", "TIBC", "IRON", "RETICCTPCT" in the last 72 hours. Sepsis Labs: No  results for input(s): "PROCALCITON", "LATICACIDVEN" in the last 168 hours.  Recent Results (from the past 240 hour(s))  SARS Coronavirus 2 by RT PCR (hospital order, performed in [facility] hospital lab) *cepheid single result test* Anterior Nasal Swab     Status: None   Collection Time: 02/08/22  5:22 PM   Specimen: Anterior Nasal Swab  Result Value Ref Range Status   SARS Coronavirus 2 by RT PCR NEGATIVE NEGATIVE Final    Comment: (NOTE) SARS-CoV-2 target nucleic acids are NOT DETECTED.  The SARS-CoV-2 RNA is generally detectable in upper and lower respiratory specimens during the acute phase of infection. The lowest concentration of SARS-CoV-2 viral copies this assay can detect is 250 copies / mL. A negative result does not preclude SARS-CoV-2 infection and should not be used as the sole basis for treatment or other patient management decisions.  A negative result may occur with improper specimen collection / handling, submission of specimen other than nasopharyngeal swab, presence of viral mutation(s) within the areas targeted by this assay, and inadequate number of viral copies (<250 copies / mL). A negative result must be combined with clinical observations, patient history, and epidemiological information.  Fact Sheet for Patients:   https://www.patel.info/  Fact Sheet for Healthcare Providers: https://hall.com/  This test is not yet approved or  cleared by  the [employer] and has been authorized for detection and/or diagnosis of SARS-CoV-2 by FDA under an Emergency Use Authorization (EUA).  This EUA will remain in effect (meaning this test can be used) for the duration of the COVID-19 declaration under Section 564(b)(1) of the Act, 21 U.S.C. section 360bbb-3(b)(1), unless the authorization is terminated or revoked sooner.  Performed at [facility], [address], [place], [place] [address]           Radiology Studies: ECHOCARDIOGRAM COMPLETE  Result Date: 02/09/2022    ECHOCARDIOGRAM REPORT   Patient Name:   Antonio Clark Laswell Date of Exam: 02/09/2022 Medical Rec #:  [id number]     Height:       69.0 in Accession #:    [id number]    Weight:       181.0 lb Date of Birth:  09/13/1935     BSA:          1.980 m Patient Age:    78 years      BP:           151/109 mmHg Patient Gender: M             HR:           95 bpm. Exam Location:  [facility] Procedure: 2D Echo, Cardiac Doppler and Color Doppler Indications:     I50.31 CHF acute diastolic  History:         Patient has prior history of Echocardiogram examinations, most                  recent 04/10/2020. CHF, CAD, History of prostate cancer,                  Arrythmias:Atrial Fibrillation; Risk Factors:Diabetes,                  Dyslipidemia, Hypertension and Sleep Apnea.  Sonographer:     Rosalia Hammers Referring Phys:  [id number] Rise Mu Diagnosing Phys: Kathlyn Sacramento MD IMPRESSIONS  1. Left ventricular ejection fraction, by estimation, is 25 to 30%. The left ventricle has severely decreased function. The left ventricle demonstrates global hypokinesis. The left ventricular internal cavity size was mildly dilated. There is moderate left ventricular hypertrophy. Left ventricular diastolic parameters are indeterminate.  2. Right ventricular systolic function is normal. The right ventricular size is normal. There is mildly elevated pulmonary artery systolic pressure.  3. Left atrial size was moderately dilated.  4. Right atrial size was moderately dilated.  5. The mitral valve is normal in structure. Mild to moderate mitral valve regurgitation. No evidence of mitral stenosis. Moderate mitral annular calcification.  6. Tricuspid valve regurgitation is moderate.  7. The aortic valve is normal in structure. Aortic valve regurgitation is mild. Aortic valve sclerosis/calcification is present, without any evidence of aortic stenosis.  8. Pulmonic valve regurgitation is  moderate.  9. The inferior vena cava is normal in size with <50% respiratory variability, suggesting right atrial pressure of 8 mmHg. FINDINGS  Left Ventricle: Left ventricular ejection fraction, by estimation, is 25 to 30%. The left ventricle has severely decreased function. The left ventricle demonstrates global hypokinesis. The left ventricular internal cavity size was mildly dilated. There is moderate left ventricular hypertrophy. Left ventricular diastolic parameters are indeterminate. Right Ventricle: The right ventricular size is normal. No increase in right ventricular wall thickness. Right ventricular systolic function is normal. There is mildly elevated pulmonary artery systolic pressure. The tricuspid regurgitant velocity is 2.99  m/s, and with an assumed right atrial pressure of 8 mmHg, the estimated right ventricular systolic pressure is 64.3 mmHg. Left Atrium: Left atrial size was moderately dilated. Right Atrium: Right atrial size was moderately dilated. Pericardium: There is no evidence of pericardial effusion. Mitral Valve: The mitral valve is normal in structure. Moderate mitral annular calcification. Mild to moderate mitral valve regurgitation. No evidence of mitral valve stenosis. Tricuspid Valve: The tricuspid valve is normal in structure. Tricuspid valve regurgitation is moderate . No evidence of tricuspid stenosis. Aortic Valve: The aortic valve is normal in structure. Aortic valve regurgitation is mild. Aortic regurgitation PHT measures 470 msec. Aortic valve sclerosis/calcification is present, without any evidence of aortic stenosis. Aortic valve mean gradient measures 2.0 mmHg. Aortic valve peak gradient measures 2.5 mmHg. Aortic valve area, by VTI measures 2.03 cm. Pulmonic Valve: The pulmonic valve was normal in structure. Pulmonic valve regurgitation is moderate. No evidence of pulmonic stenosis. Aorta: The aortic root is normal in size and structure. Venous: The inferior vena cava is  normal in size with less than 50% respiratory variability, suggesting right atrial pressure of 8 mmHg. IAS/Shunts: No atrial level shunt detected by color flow Doppler.  LEFT VENTRICLE PLAX 2D LVIDd:         5.18 cm LVIDs:         3.97 cm LV PW:         1.41 cm LV IVS:        1.38 cm LVOT diam:     2.00 cm LV SV:         28 LV SV Index:   14 LVOT Area:     3.14 cm  RIGHT VENTRICLE RV Basal diam:  4.12 cm LEFT ATRIUM             Index        RIGHT ATRIUM           Index LA diam:        4.20 cm 2.12 cm/m   RA Area:     23.50 cm LA Vol (A2C):   65.4 ml 33.03 ml/m  RA Volume:   76.20 ml  38.48 ml/m LA Vol (A4C):   52.0 ml 26.26 ml/m LA Biplane Vol: 59.6 ml 30.10 ml/m  AORTIC VALVE                    PULMONIC VALVE AV Area (Vmax):    2.07 cm     PR End Diast Vel: 5.20 msec AV Area (Vmean):   1.98 cm AV Area (VTI):     2.03 cm AV Vmax:           78.40 cm/s AV Vmean:          57.400 cm/s AV VTI:            0.136 m AV Peak Grad:      2.5 mmHg AV Mean Grad:      2.0 mmHg LVOT Vmax:         51.70 cm/s LVOT Vmean:        36.200 cm/s LVOT VTI:          0.088 m LVOT/AV VTI ratio: 0.65 AI PHT:            470 msec  AORTA Ao Root diam: 3.50 cm MITRAL VALVE               TRICUSPID VALVE MV Area (PHT): 3.24 cm    TR Peak grad:  35.8 mmHg MV Decel Time: 234 msec    TR Vmax:        299.00 cm/s MV E velocity: 68.20 cm/s                            SHUNTS                            Systemic VTI:  0.09 m                            Systemic Diam: 2.00 cm Kathlyn Sacramento MD Electronically signed by Kathlyn Sacramento MD Signature Date/Time: 02/09/2022/5:10:19 PM    Final    CT Angio Chest PE W and/or Wo Contrast  Result Date: 02/08/2022 CLINICAL DATA:  Pulmonary embolism (PE) suspected, high prob EXAM: CT ANGIOGRAPHY CHEST WITH CONTRAST TECHNIQUE: Multidetector CT imaging of the chest was performed using the standard protocol during bolus administration of intravenous contrast. Multiplanar CT image reconstructions and MIPs were  obtained to evaluate the vascular anatomy. RADIATION DOSE REDUCTION: This exam was performed according to the departmental dose-optimization program which includes automated exposure control, adjustment of the mA and/or kV according to patient size and/or use of iterative reconstruction technique. CONTRAST:  84m OMNIPAQUE IOHEXOL 350 MG/ML SOLN COMPARISON:  None Available. FINDINGS: Cardiovascular: Satisfactory opacification of the pulmonary arteries to the segmental level. No evidence of pulmonary embolism. The main pulmonary artery measures 3.1 cm. Prominent heart size. No significant pericardial effusion. The thoracic aorta is normal in caliber. No atherosclerotic plaque of the thoracic aorta. No coronary artery calcifications. Mediastinum/Nodes: No enlarged mediastinal, hilar, or axillary lymph nodes. Thyroid gland, trachea, and esophagus demonstrate no significant findings. Lungs/Pleura: Expiratory phase of respiration with mosaic attenuation of the lungs. No focal consolidation. No pulmonary nodule. No pulmonary mass. Bilateral small to moderate, right greater than left, pleural effusions. No pneumothorax. Diffuse mild bronchial wall thickening. Upper Abdomen: No acute abnormality. Musculoskeletal: No chest wall abnormality. No suspicious lytic or blastic osseous lesions. No acute displaced fracture. Multilevel degenerative changes of the spine. Review of the MIP images confirms the above findings. IMPRESSION: 1. Bilateral small to moderate, right greater than left, pleural effusions. 2. Bronchial wall thickening suggestive of small airway disease. 3. Cardiomegaly. Electronically Signed   By: MIven FinnM.D.   On: 02/08/2022 18:44   DG Chest 2 View  Result Date: 02/08/2022 CLINICAL DATA:  sob EXAM: CHEST - 2 VIEW COMPARISON:  None Available. FINDINGS: The heart and mediastinal contours are unchanged. Prominent hilar vasculature. No focal consolidation. Increased interstitial markings. Bilateral trace  pleural effusion. No pneumothorax. No acute osseous abnormality.  Right chest wall vascular clips. IMPRESSION: Pulmonary edema.  Bilateral trace pleural effusions. Electronically Signed   By: MIven FinnM.D.   On: 02/08/2022 17:00        Scheduled Meds:  atorvastatin  10 mg Oral Daily   diclofenac Sodium  2 g Topical QID   finasteride  5 mg Oral Daily   furosemide  40 mg Intravenous Q12H   insulin aspart  0-9 Units Subcutaneous TID AC & HS   linagliptin  5 mg Oral Daily   loratadine  10 mg Oral Daily   losartan  12.5 mg Oral Daily   metoprolol tartrate  50 mg Oral BID   mupirocin ointment  1 Application Topical BID   potassium chloride  40  mEq Oral Q2H   Continuous Infusions:  heparin 1,200 Units/hr (02/10/22 0001)     LOS: 2 days      Sidney Ace, MD [facility]   If 7PM-7AM, please contact night-coverage  02/10/2022, 12:55 PM

## 2022-02-10 NOTE — Progress Notes (Signed)
Progress Note  Patient Name: Antonio Clark Date of Encounter: 02/10/2022  [facility] Cardiologist: Candee Furbish, MD   Subjective   He reports improvement in shortness of breath and no chest pain.  He has noted gradual decline in stamina and physical capacity over the last year.   Inpatient Medications    Scheduled Meds:  atorvastatin  10 mg Oral Daily   diclofenac Sodium  2 g Topical QID   finasteride  5 mg Oral Daily   furosemide  40 mg Intravenous Q12H   insulin aspart  0-9 Units Subcutaneous TID AC & HS   linagliptin  5 mg Oral Daily   loratadine  10 mg Oral Daily   metoprolol tartrate  50 mg Oral BID   mupirocin ointment  1 Application Topical BID   potassium chloride  40 mEq Oral Q2H   Continuous Infusions:  heparin 1,200 Units/hr (02/10/22 0001)   magnesium sulfate bolus IVPB 2 g (02/10/22 1103)   PRN Meds: acetaminophen **OR** acetaminophen, fluticasone, LORazepam, magnesium hydroxide, ondansetron **OR** ondansetron (ZOFRAN) IV, traZODone   Vital Signs    Vitals:   02/09/22 2054 02/09/22 2359 02/10/22 0421 02/10/22 0723  BP: 116/74 118/72 133/88 (!) 155/94  Pulse: (!) 109 88 (!) 102 (!) 101  Resp: '16 19 20 18  '$ Temp: 97.9 F (36.6 C) 98 F (36.7 C) 98.2 F (36.8 C) 97.9 F (36.6 C)  TempSrc:      SpO2: 96% 98% 97% 97%  Weight:   79.6 kg   Height:        Intake/Output Summary (Last 24 hours) at 02/10/2022 1151 Last data filed at 02/10/2022 0927 Gross per 24 hour  Intake 285.81 ml  Output 450 ml  Net -164.19 ml      02/10/2022    4:21 AM 02/08/2022    4:14 PM 01/20/2022   11:38 AM  Last 3 Weights  Weight (lbs) 175 lb 7.8 oz 181 lb 180 lb  Weight (kg) 79.6 kg 82.101 kg 81.647 kg      Telemetry    Atrial fibrillation with ventricular rate in the low 100 range- Personally Reviewed  ECG     - Personally Reviewed  Physical Exam   GEN: No acute distress.   Neck: No JVD Cardiac: Irregularly irregular, no murmurs, rubs, or gallops.   Respiratory: No crackles today but diminished breath sounds at the base. GI: Soft, nontender, non-distended  MS: ; No deformity.  Mild edema in the right leg. Neuro:  Nonfocal  Psych: Normal affect  Right radial pulse is mildly diminished but normal on the left side.  Labs    High Sensitivity Troponin:   Recent Labs  Lab 02/08/22 1617 02/08/22 1844  TROPONINIHS 34* 35*     Chemistry Recent Labs  Lab 02/08/22 1617 02/09/22 0458 02/10/22 0748  NA 142 142 138  K 3.6 3.1* 3.4*  CL 109 109 105  CO2 '24 26 24  '$ GLUCOSE 173* 128* 121*  BUN '18 19 21  '$ CREATININE 1.00 1.28* 1.15  CALCIUM 9.4 9.6 8.9  MG  --   --  1.6*  GFRNONAA >60 55* >60  ANIONGAP '9 7 9    '$ Lipids No results for input(s): "CHOL", "TRIG", "HDL", "LABVLDL", "LDLCALC", "CHOLHDL" in the last 168 hours.  Hematology Recent Labs  Lab 02/08/22 1617 02/09/22 0458 02/10/22 0748  WBC 6.7 8.6 6.6  RBC 4.24 4.61 4.29  HGB 12.0* 13.1 12.2*  HCT 38.6* 41.7 37.7*  MCV 91.0 90.5 87.9  MCH 28.3 28.4 28.4  MCHC 31.1 31.4 32.4  RDW 15.6* 15.7* 15.6*  PLT 149* 187 150   Thyroid  Recent Labs  Lab 02/09/22 0458  TSH 3.207    BNP Recent Labs  Lab 02/08/22 1627  BNP 1,751.8*    DDimer No results for input(s): "DDIMER" in the last 168 hours.   Radiology    ECHOCARDIOGRAM COMPLETE  Result Date: 02/09/2022    ECHOCARDIOGRAM REPORT   Patient Name:   SANAV REMER Lorenzo Date of Exam: 02/09/2022 Medical Rec #:  [id number]     Height:       69.0 in Accession #:    [id number]    Weight:       181.0 lb Date of Birth:  10-31-1935     BSA:          1.980 m Patient Age:    86 years      BP:           151/109 mmHg Patient Gender: M             HR:           95 bpm. Exam Location:  [facility] Procedure: 2D Echo, Cardiac Doppler and Color Doppler Indications:     I50.31 CHF acute diastolic  History:         Patient has prior history of Echocardiogram examinations, most                  recent 04/10/2020. CHF, CAD, History of prostate cancer,                   Arrythmias:Atrial Fibrillation; Risk Factors:Diabetes,                  Dyslipidemia, Hypertension and Sleep Apnea.  Sonographer:     Rosalia Hammers Referring Phys:  [id number] Rise Mu Diagnosing Phys: Kathlyn Sacramento MD IMPRESSIONS  1. Left ventricular ejection fraction, by estimation, is 25 to 30%. The left ventricle has severely decreased function. The left ventricle demonstrates global hypokinesis. The left ventricular internal cavity size was mildly dilated. There is moderate left ventricular hypertrophy. Left ventricular diastolic parameters are indeterminate.  2. Right ventricular systolic function is normal. The right ventricular size is normal. There is mildly elevated pulmonary artery systolic pressure.  3. Left atrial size was moderately dilated.  4. Right atrial size was moderately dilated.  5. The mitral valve is normal in structure. Mild to moderate mitral valve regurgitation. No evidence of mitral stenosis. Moderate mitral annular calcification.  6. Tricuspid valve regurgitation is moderate.  7. The aortic valve is normal in structure. Aortic valve regurgitation is mild. Aortic valve sclerosis/calcification is present, without any evidence of aortic stenosis.  8. Pulmonic valve regurgitation is moderate.  9. The inferior vena cava is normal in size with <50% respiratory variability, suggesting right atrial pressure of 8 mmHg. FINDINGS  Left Ventricle: Left ventricular ejection fraction, by estimation, is 25 to 30%. The left ventricle has severely decreased function. The left ventricle demonstrates global hypokinesis. The left ventricular internal cavity size was mildly dilated. There is moderate left ventricular hypertrophy. Left ventricular diastolic parameters are indeterminate. Right Ventricle: The right ventricular size is normal. No increase in right ventricular wall thickness. Right ventricular systolic function is normal. There is mildly elevated pulmonary artery systolic pressure. The  tricuspid regurgitant velocity is 2.99  m/s, and with an assumed right atrial pressure of 8 mmHg, the estimated right ventricular systolic pressure is 43.8  mmHg. Left Atrium: Left atrial size was moderately dilated. Right Atrium: Right atrial size was moderately dilated. Pericardium: There is no evidence of pericardial effusion. Mitral Valve: The mitral valve is normal in structure. Moderate mitral annular calcification. Mild to moderate mitral valve regurgitation. No evidence of mitral valve stenosis. Tricuspid Valve: The tricuspid valve is normal in structure. Tricuspid valve regurgitation is moderate . No evidence of tricuspid stenosis. Aortic Valve: The aortic valve is normal in structure. Aortic valve regurgitation is mild. Aortic regurgitation PHT measures 470 msec. Aortic valve sclerosis/calcification is present, without any evidence of aortic stenosis. Aortic valve mean gradient measures 2.0 mmHg. Aortic valve peak gradient measures 2.5 mmHg. Aortic valve area, by VTI measures 2.03 cm. Pulmonic Valve: The pulmonic valve was normal in structure. Pulmonic valve regurgitation is moderate. No evidence of pulmonic stenosis. Aorta: The aortic root is normal in size and structure. Venous: The inferior vena cava is normal in size with less than 50% respiratory variability, suggesting right atrial pressure of 8 mmHg. IAS/Shunts: No atrial level shunt detected by color flow Doppler.  LEFT VENTRICLE PLAX 2D LVIDd:         5.18 cm LVIDs:         3.97 cm LV PW:         1.41 cm LV IVS:        1.38 cm LVOT diam:     2.00 cm LV SV:         28 LV SV Index:   14 LVOT Area:     3.14 cm  RIGHT VENTRICLE RV Basal diam:  4.12 cm LEFT ATRIUM             Index        RIGHT ATRIUM           Index LA diam:        4.20 cm 2.12 cm/m   RA Area:     23.50 cm LA Vol (A2C):   65.4 ml 33.03 ml/m  RA Volume:   76.20 ml  38.48 ml/m LA Vol (A4C):   52.0 ml 26.26 ml/m LA Biplane Vol: 59.6 ml 30.10 ml/m  AORTIC VALVE                     PULMONIC VALVE AV Area (Vmax):    2.07 cm     PR End Diast Vel: 5.20 msec AV Area (Vmean):   1.98 cm AV Area (VTI):     2.03 cm AV Vmax:           78.40 cm/s AV Vmean:          57.400 cm/s AV VTI:            0.136 m AV Peak Grad:      2.5 mmHg AV Mean Grad:      2.0 mmHg LVOT Vmax:         51.70 cm/s LVOT Vmean:        36.200 cm/s LVOT VTI:          0.088 m LVOT/AV VTI ratio: 0.65 AI PHT:            470 msec  AORTA Ao Root diam: 3.50 cm MITRAL VALVE               TRICUSPID VALVE MV Area (PHT): 3.24 cm    TR Peak grad:   35.8 mmHg MV Decel Time: 234 msec    TR Vmax:  299.00 cm/s MV E velocity: 68.20 cm/s                            SHUNTS                            Systemic VTI:  0.09 m                            Systemic Diam: 2.00 cm Kathlyn Sacramento MD Electronically signed by Kathlyn Sacramento MD Signature Date/Time: 02/09/2022/5:10:19 PM    Final    CT Angio Chest PE W and/or Wo Contrast  Result Date: 02/08/2022 CLINICAL DATA:  Pulmonary embolism (PE) suspected, high prob EXAM: CT ANGIOGRAPHY CHEST WITH CONTRAST TECHNIQUE: Multidetector CT imaging of the chest was performed using the standard protocol during bolus administration of intravenous contrast. Multiplanar CT image reconstructions and MIPs were obtained to evaluate the vascular anatomy. RADIATION DOSE REDUCTION: This exam was performed according to the departmental dose-optimization program which includes automated exposure control, adjustment of the mA and/or kV according to patient size and/or use of iterative reconstruction technique. CONTRAST:  30m OMNIPAQUE IOHEXOL 350 MG/ML SOLN COMPARISON:  None Available. FINDINGS: Cardiovascular: Satisfactory opacification of the pulmonary arteries to the segmental level. No evidence of pulmonary embolism. The main pulmonary artery measures 3.1 cm. Prominent heart size. No significant pericardial effusion. The thoracic aorta is normal in caliber. No atherosclerotic plaque of the thoracic aorta. No  coronary artery calcifications. Mediastinum/Nodes: No enlarged mediastinal, hilar, or axillary lymph nodes. Thyroid gland, trachea, and esophagus demonstrate no significant findings. Lungs/Pleura: Expiratory phase of respiration with mosaic attenuation of the lungs. No focal consolidation. No pulmonary nodule. No pulmonary mass. Bilateral small to moderate, right greater than left, pleural effusions. No pneumothorax. Diffuse mild bronchial wall thickening. Upper Abdomen: No acute abnormality. Musculoskeletal: No chest wall abnormality. No suspicious lytic or blastic osseous lesions. No acute displaced fracture. Multilevel degenerative changes of the spine. Review of the MIP images confirms the above findings. IMPRESSION: 1. Bilateral small to moderate, right greater than left, pleural effusions. 2. Bronchial wall thickening suggestive of small airway disease. 3. Cardiomegaly. Electronically Signed   By: MIven FinnM.D.   On: 02/08/2022 18:44   DG Chest 2 View  Result Date: 02/08/2022 CLINICAL DATA:  sob EXAM: CHEST - 2 VIEW COMPARISON:  None Available. FINDINGS: The heart and mediastinal contours are unchanged. Prominent hilar vasculature. No focal consolidation. Increased interstitial markings. Bilateral trace pleural effusion. No pneumothorax. No acute osseous abnormality.  Right chest wall vascular clips. IMPRESSION: Pulmonary edema.  Bilateral trace pleural effusions. Electronically Signed   By: MIven FinnM.D.   On: 02/08/2022 17:00    Cardiac Studies   Echocardiogram was done yesterday and was personally reviewed by me.  It showed a drop in ejection fraction 25 to 30% with mild to moderate mitral regurgitation, moderate tricuspid regurgitation and mild to moderate pulmonary hypertension.  Patient Profile     86y.o. male  with a hx of Persistent A-fib status post ablation in 04/2020 with recurrent A-fib status post DCCV in 05/2020 on Pradaxa, DM 2, HTN, HLD, strong family history of CAD,  melanoma, prostate cancer, and sleep apnea on CPAP who is being seen today for the evaluation of volume overload and A-fib with RVR   Assessment & Plan    1.  Acute on chronic  systolic heart failure with significant drop in ejection fraction to 25 to 30% from prior of 50 to 55%.  Volume overload is improving with current dose of IV furosemide which should be continued.  Continue metoprolol tartrate which should be switched to Toprol before hospital discharge.  This is being used for rate control of atrial fibrillation. I will add small dose losartan today hopefully with plans to switch to Entresto dose if his blood pressure allows. I recommend proceeding with a right and left cardiac catheterization tomorrow with possible PCI.  I discussed the procedure in details as well as risk and benefits.  He is agreeable to proceed.  His right radial pulse is mildly diminished and should consider ultrasound guidance or left radial access.  2.  Persistent atrial fibrillation: The patient had prior ablation and cardioversion and might be hard to get him back in sinus rhythm without an antiarrhythmic medication.  Continue rate control for now with metoprolol.  Continue anticoagulation with unfractionated heparin in anticipation of cardiac cath.  Pradaxa is currently on hold for that reason.  3.  Elevated troponin: Possible supply demand ischemia but given drop in ejection fraction, the plan is to proceed with cardiac catheterization tomorrow as outlined.  Total encounter time more than 50 minutes. Greater than 50% was spent in counseling and coordination of care with the patient.       For questions or updates, please contact [place] Please consult [email or web address] for contact info under        Signed, Kathlyn Sacramento, MD  02/10/2022, 11:51 AM

## 2022-02-10 NOTE — Plan of Care (Signed)
  Problem: Education: Goal: Ability to demonstrate management of disease process will improve Outcome: Progressing Goal: Ability to verbalize understanding of medication therapies will improve Outcome: Progressing Goal: Individualized Educational Video(s) Outcome: Progressing   Problem: Cardiac: Goal: Ability to achieve and maintain adequate cardiopulmonary perfusion will improve Outcome: Progressing   

## 2022-02-11 ENCOUNTER — Encounter
Admission: EM | Disposition: A | Discharge status: Other Acute Inpatient Hospital | Payer: Self-pay | Source: Home / Self Care | Attending: Internal Medicine

## 2022-02-11 ENCOUNTER — Inpatient Hospital Stay (HOSPITAL_COMMUNITY)
Admission: AD | Admit: 2022-02-11 | Discharge: 2022-02-24 | DRG: 286 | Disposition: A | Discharge status: Home Health | Payer: Medicare Other | Source: Other Acute Inpatient Hospital | Attending: Cardiology | Admitting: Cardiology

## 2022-02-11 DIAGNOSIS — R54 Age-related physical debility: Secondary | ICD-10-CM | POA: Diagnosis not present

## 2022-02-11 DIAGNOSIS — Z7901 Long term (current) use of anticoagulants: Secondary | ICD-10-CM

## 2022-02-11 DIAGNOSIS — Z833 Family history of diabetes mellitus: Secondary | ICD-10-CM

## 2022-02-11 DIAGNOSIS — I272 Pulmonary hypertension, unspecified: Secondary | ICD-10-CM | POA: Diagnosis present

## 2022-02-11 DIAGNOSIS — G4733 Obstructive sleep apnea (adult) (pediatric): Secondary | ICD-10-CM | POA: Diagnosis present

## 2022-02-11 DIAGNOSIS — N179 Acute kidney failure, unspecified: Secondary | ICD-10-CM | POA: Diagnosis not present

## 2022-02-11 DIAGNOSIS — I25110 Atherosclerotic heart disease of native coronary artery with unstable angina pectoris: Secondary | ICD-10-CM | POA: Diagnosis present

## 2022-02-11 DIAGNOSIS — I4819 Other persistent atrial fibrillation: Secondary | ICD-10-CM | POA: Diagnosis present

## 2022-02-11 DIAGNOSIS — I340 Nonrheumatic mitral (valve) insufficiency: Secondary | ICD-10-CM | POA: Diagnosis not present

## 2022-02-11 DIAGNOSIS — Z83438 Family history of other disorder of lipoprotein metabolism and other lipidemia: Secondary | ICD-10-CM

## 2022-02-11 DIAGNOSIS — I428 Other cardiomyopathies: Secondary | ICD-10-CM | POA: Diagnosis present

## 2022-02-11 DIAGNOSIS — E872 Acidosis, unspecified: Secondary | ICD-10-CM | POA: Diagnosis present

## 2022-02-11 DIAGNOSIS — E119 Type 2 diabetes mellitus without complications: Secondary | ICD-10-CM | POA: Diagnosis present

## 2022-02-11 DIAGNOSIS — I2510 Atherosclerotic heart disease of native coronary artery without angina pectoris: Secondary | ICD-10-CM | POA: Diagnosis not present

## 2022-02-11 DIAGNOSIS — Z8249 Family history of ischemic heart disease and other diseases of the circulatory system: Secondary | ICD-10-CM | POA: Diagnosis not present

## 2022-02-11 DIAGNOSIS — I5023 Acute on chronic systolic (congestive) heart failure: Secondary | ICD-10-CM | POA: Diagnosis present

## 2022-02-11 DIAGNOSIS — Z8546 Personal history of malignant neoplasm of prostate: Secondary | ICD-10-CM

## 2022-02-11 DIAGNOSIS — I480 Paroxysmal atrial fibrillation: Secondary | ICD-10-CM

## 2022-02-11 DIAGNOSIS — K219 Gastro-esophageal reflux disease without esophagitis: Secondary | ICD-10-CM | POA: Diagnosis present

## 2022-02-11 DIAGNOSIS — I5021 Acute systolic (congestive) heart failure: Secondary | ICD-10-CM

## 2022-02-11 DIAGNOSIS — I110 Hypertensive heart disease with heart failure: Secondary | ICD-10-CM | POA: Diagnosis present

## 2022-02-11 DIAGNOSIS — Z85820 Personal history of malignant melanoma of skin: Secondary | ICD-10-CM | POA: Diagnosis not present

## 2022-02-11 DIAGNOSIS — Z7951 Long term (current) use of inhaled steroids: Secondary | ICD-10-CM | POA: Diagnosis not present

## 2022-02-11 DIAGNOSIS — E785 Hyperlipidemia, unspecified: Secondary | ICD-10-CM | POA: Diagnosis present

## 2022-02-11 DIAGNOSIS — E7800 Pure hypercholesterolemia, unspecified: Secondary | ICD-10-CM

## 2022-02-11 DIAGNOSIS — I5043 Acute on chronic combined systolic (congestive) and diastolic (congestive) heart failure: Secondary | ICD-10-CM | POA: Diagnosis not present

## 2022-02-11 DIAGNOSIS — I4891 Unspecified atrial fibrillation: Secondary | ICD-10-CM | POA: Diagnosis not present

## 2022-02-11 DIAGNOSIS — I200 Unstable angina: Secondary | ICD-10-CM | POA: Diagnosis present

## 2022-02-11 DIAGNOSIS — I083 Combined rheumatic disorders of mitral, aortic and tricuspid valves: Secondary | ICD-10-CM | POA: Diagnosis present

## 2022-02-11 DIAGNOSIS — I361 Nonrheumatic tricuspid (valve) insufficiency: Secondary | ICD-10-CM | POA: Diagnosis not present

## 2022-02-11 DIAGNOSIS — I160 Hypertensive urgency: Secondary | ICD-10-CM | POA: Diagnosis not present

## 2022-02-11 DIAGNOSIS — I429 Cardiomyopathy, unspecified: Secondary | ICD-10-CM | POA: Diagnosis not present

## 2022-02-11 DIAGNOSIS — Z823 Family history of stroke: Secondary | ICD-10-CM

## 2022-02-11 DIAGNOSIS — E875 Hyperkalemia: Secondary | ICD-10-CM | POA: Diagnosis not present

## 2022-02-11 DIAGNOSIS — R0602 Shortness of breath: Secondary | ICD-10-CM | POA: Diagnosis not present

## 2022-02-11 DIAGNOSIS — R040 Epistaxis: Secondary | ICD-10-CM | POA: Diagnosis not present

## 2022-02-11 DIAGNOSIS — Z79899 Other long term (current) drug therapy: Secondary | ICD-10-CM | POA: Diagnosis not present

## 2022-02-11 DIAGNOSIS — I255 Ischemic cardiomyopathy: Secondary | ICD-10-CM | POA: Diagnosis not present

## 2022-02-11 DIAGNOSIS — F419 Anxiety disorder, unspecified: Secondary | ICD-10-CM | POA: Diagnosis present

## 2022-02-11 DIAGNOSIS — R791 Abnormal coagulation profile: Secondary | ICD-10-CM | POA: Diagnosis present

## 2022-02-11 HISTORY — PX: RIGHT/LEFT HEART CATH AND CORONARY ANGIOGRAPHY: CATH118266

## 2022-02-11 LAB — CBC
HCT: 39.5 % (ref 39.0–52.0)
Hemoglobin: 12.9 g/dL — ABNORMAL LOW (ref 13.0–17.0)
MCH: 28.7 pg (ref 26.0–34.0)
MCHC: 32.7 g/dL (ref 30.0–36.0)
MCV: 87.8 fL (ref 80.0–100.0)
Platelets: 153 10*3/uL (ref 150–400)
RBC: 4.5 MIL/uL (ref 4.22–5.81)
RDW: 15.6 % — ABNORMAL HIGH (ref 11.5–15.5)
WBC: 7.2 10*3/uL (ref 4.0–10.5)
nRBC: 0 % (ref 0.0–0.2)

## 2022-02-11 LAB — BASIC METABOLIC PANEL
Anion gap: 11 (ref 5–15)
BUN: 29 mg/dL — ABNORMAL HIGH (ref 8–23)
CO2: 23 mmol/L (ref 22–32)
Calcium: 9.1 mg/dL (ref 8.9–10.3)
Chloride: 106 mmol/L (ref 98–111)
Creatinine, Ser: 1.22 mg/dL (ref 0.61–1.24)
GFR, Estimated: 58 mL/min — ABNORMAL LOW (ref >60)
Glucose, Bld: 143 mg/dL — ABNORMAL HIGH (ref 70–99)
Potassium: 4.1 mmol/L (ref 3.5–5.1)
Sodium: 140 mmol/L (ref 135–145)

## 2022-02-11 LAB — GLUCOSE, CAPILLARY
Glucose-Capillary: 137 mg/dL — ABNORMAL HIGH (ref 70–99)
Glucose-Capillary: 145 mg/dL — ABNORMAL HIGH (ref 70–99)
Glucose-Capillary: 118 mg/dL — ABNORMAL HIGH (ref 70–99)

## 2022-02-11 LAB — HEPARIN LEVEL (UNFRACTIONATED): Heparin Unfractionated: 0.55 IU/mL (ref 0.30–0.70)

## 2022-02-11 SURGERY — RIGHT/LEFT HEART CATH AND CORONARY ANGIOGRAPHY
Anesthesia: Moderate Sedation

## 2022-02-11 MED ORDER — ACETAMINOPHEN 325 MG PO TABS: 650.0000 mg | ORAL_TABLET | ORAL | Status: DC | PRN

## 2022-02-11 MED ORDER — NITROGLYCERIN 0.4 MG SL SUBL: 0.4000 mg | SUBLINGUAL_TABLET | SUBLINGUAL | Status: DC | PRN

## 2022-02-11 MED ORDER — METOPROLOL TARTRATE 50 MG PO TABS: 50.0000 mg | ORAL_TABLET | Freq: Three times a day (TID) | ORAL | Status: DC

## 2022-02-11 MED ORDER — FLUTICASONE PROPIONATE 50 MCG/ACT NA SUSP: 1.0000 | Freq: Every day | NASAL | Status: DC | PRN

## 2022-02-11 MED ORDER — HEPARIN (PORCINE) IN NACL 1000-0.9 UT/500ML-% IV SOLN
INTRAVENOUS | Status: AC
  Filled 2022-02-11: qty 1000

## 2022-02-11 MED ORDER — SODIUM CHLORIDE 0.9% FLUSH: 3.0000 mL | INTRAVENOUS | Status: DC | PRN

## 2022-02-11 MED ORDER — LOSARTAN POTASSIUM 25 MG PO TABS: 12.5000 mg | ORAL_TABLET | Freq: Every day | ORAL | Status: DC

## 2022-02-11 MED ORDER — TRAZODONE HCL 50 MG PO TABS: 25.0000 mg | ORAL_TABLET | Freq: Every evening | ORAL | Status: DC | PRN

## 2022-02-11 MED ORDER — INSULIN ASPART 100 UNIT/ML IJ SOLN: 0.0000 [IU] | Freq: Three times a day (TID) | INTRAMUSCULAR | Status: DC

## 2022-02-11 MED ORDER — LIDOCAINE HCL 1 % IJ SOLN
INTRAMUSCULAR | Status: AC
  Filled 2022-02-11: qty 20

## 2022-02-11 MED ORDER — SODIUM CHLORIDE 0.9 % IV SOLN: 250.0000 mL | INTRAVENOUS | 0 refills | Status: DC | PRN

## 2022-02-11 MED ORDER — LIDOCAINE HCL (PF) 1 % IJ SOLN
INTRAMUSCULAR | Status: DC | PRN
  Administered 2022-02-11 (×2): 2 mL

## 2022-02-11 MED ORDER — FENTANYL CITRATE (PF) 100 MCG/2ML IJ SOLN
INTRAMUSCULAR | Status: DC | PRN
  Administered 2022-02-11: 12.5 ug via INTRAVENOUS

## 2022-02-11 MED ORDER — SODIUM CHLORIDE 0.9% FLUSH: 3.0000 mL | Freq: Two times a day (BID) | INTRAVENOUS | Status: DC

## 2022-02-11 MED ORDER — INSULIN ASPART 100 UNIT/ML IJ SOLN: 0.0000 [IU] | Freq: Every day | INTRAMUSCULAR | Status: DC

## 2022-02-11 MED ORDER — SODIUM CHLORIDE 0.9 % IV SOLN: 250.0000 mL | INTRAVENOUS | Status: DC | PRN

## 2022-02-11 MED ORDER — FINASTERIDE 5 MG PO TABS
5.0000 mg | ORAL_TABLET | Freq: Every day | ORAL | Status: DC
  Administered 2022-02-12 – 2022-02-24 (×13): 5 mg via ORAL
  Filled 2022-02-11 (×13): qty 1

## 2022-02-11 MED ORDER — FUROSEMIDE 10 MG/ML IJ SOLN: 40.0000 mg | Freq: Two times a day (BID) | INTRAMUSCULAR | Status: DC

## 2022-02-11 MED ORDER — HEPARIN (PORCINE) 25000 UT/250ML-% IV SOLN: 1200.0000 [IU]/h | INTRAVENOUS | Status: DC

## 2022-02-11 MED ORDER — TRAZODONE HCL 50 MG PO TABS
25.0000 mg | ORAL_TABLET | Freq: Every evening | ORAL | Status: DC | PRN
  Filled 2022-02-11: qty 1

## 2022-02-11 MED ORDER — DICLOFENAC SODIUM 1 % EX GEL
4.0000 g | Freq: Four times a day (QID) | CUTANEOUS | Status: DC
  Administered 2022-02-11 – 2022-02-23 (×17): 4 g via TOPICAL
  Filled 2022-02-11: qty 100

## 2022-02-11 MED ORDER — LINAGLIPTIN 5 MG PO TABS
5.0000 mg | ORAL_TABLET | Freq: Every day | ORAL | Status: DC
  Administered 2022-02-12 – 2022-02-24 (×12): 5 mg via ORAL
  Filled 2022-02-11 (×13): qty 1

## 2022-02-11 MED ORDER — METOPROLOL SUCCINATE ER 50 MG PO TB24
150.0000 mg | ORAL_TABLET | Freq: Every day | ORAL | Status: DC
  Administered 2022-02-12 – 2022-02-15 (×4): 150 mg via ORAL
  Filled 2022-02-11 (×5): qty 1

## 2022-02-11 MED ORDER — ACETAMINOPHEN 325 MG PO TABS: 650.0000 mg | ORAL_TABLET | Freq: Four times a day (QID) | ORAL | Status: AC | PRN

## 2022-02-11 MED ORDER — ONDANSETRON HCL 4 MG/2ML IJ SOLN
4.0000 mg | Freq: Four times a day (QID) | INTRAMUSCULAR | Status: DC | PRN
  Administered 2022-02-14 – 2022-02-22 (×3): 4 mg via INTRAVENOUS
  Filled 2022-02-11 (×3): qty 2

## 2022-02-11 MED ORDER — HYDRALAZINE HCL 20 MG/ML IJ SOLN: 10.0000 mg | INTRAMUSCULAR | Status: DC | PRN

## 2022-02-11 MED ORDER — HEPARIN SODIUM (PORCINE) 1000 UNIT/ML IJ SOLN
INTRAMUSCULAR | Status: DC | PRN
  Administered 2022-02-11: 4000 [IU] via INTRAVENOUS

## 2022-02-11 MED ORDER — INSULIN ASPART 100 UNIT/ML IJ SOLN: 0.0000 [IU] | Freq: Three times a day (TID) | INTRAMUSCULAR | 11 refills | Status: DC

## 2022-02-11 MED ORDER — MUPIROCIN 2 % EX OINT
TOPICAL_OINTMENT | Freq: Two times a day (BID) | CUTANEOUS | Status: DC
  Filled 2022-02-11: qty 22

## 2022-02-11 MED ORDER — HEPARIN (PORCINE) 25000 UT/250ML-% IV SOLN
1200.0000 [IU]/h | INTRAVENOUS | Status: DC
  Administered 2022-02-11 – 2022-02-12 (×2): 1200 [IU]/h via INTRAVENOUS
  Filled 2022-02-11 (×2): qty 250

## 2022-02-11 MED ORDER — FENTANYL CITRATE (PF) 100 MCG/2ML IJ SOLN
INTRAMUSCULAR | Status: AC
  Filled 2022-02-11: qty 2

## 2022-02-11 MED ORDER — IOHEXOL 300 MG/ML  SOLN
INTRAMUSCULAR | Status: DC | PRN
  Administered 2022-02-11: 28 mL

## 2022-02-11 MED ORDER — HEPARIN (PORCINE) 25000 UT/250ML-% IV SOLN
1200.0000 [IU]/h | INTRAVENOUS | Status: DC
Start: 2022-02-11 — End: 2022-02-12

## 2022-02-11 MED ORDER — MIDAZOLAM HCL 2 MG/2ML IJ SOLN
INTRAMUSCULAR | Status: AC
  Filled 2022-02-11: qty 2

## 2022-02-11 MED ORDER — ONDANSETRON HCL 4 MG PO TABS: 4.0000 mg | ORAL_TABLET | Freq: Four times a day (QID) | ORAL | 0 refills | Status: DC | PRN

## 2022-02-11 MED ORDER — ONDANSETRON HCL 4 MG/2ML IJ SOLN
4.0000 mg | Freq: Four times a day (QID) | INTRAMUSCULAR | 0 refills | Status: DC | PRN
Start: 2022-02-11 — End: 2022-02-12

## 2022-02-11 MED ORDER — MIDAZOLAM HCL 2 MG/2ML IJ SOLN
INTRAMUSCULAR | Status: DC | PRN
  Administered 2022-02-11: .5 mg via INTRAVENOUS

## 2022-02-11 MED ORDER — LORATADINE 10 MG PO TABS
10.0000 mg | ORAL_TABLET | Freq: Every day | ORAL | Status: DC
  Administered 2022-02-12 – 2022-02-24 (×13): 10 mg via ORAL
  Filled 2022-02-11 (×13): qty 1

## 2022-02-11 MED ORDER — VERAPAMIL HCL 2.5 MG/ML IV SOLN
INTRAVENOUS | Status: AC
  Filled 2022-02-11: qty 2

## 2022-02-11 MED ORDER — FUROSEMIDE 10 MG/ML IJ SOLN: 40.0000 mg | Freq: Two times a day (BID) | INTRAMUSCULAR | 0 refills | Status: DC

## 2022-02-11 MED ORDER — ACETAMINOPHEN 650 MG RE SUPP: 650.0000 mg | Freq: Four times a day (QID) | RECTAL | 0 refills | Status: DC | PRN

## 2022-02-11 MED ORDER — ASPIRIN 81 MG PO TBEC
81.0000 mg | DELAYED_RELEASE_TABLET | Freq: Every day | ORAL | Status: DC
  Administered 2022-02-12 – 2022-02-24 (×13): 81 mg via ORAL
  Filled 2022-02-11 (×13): qty 1

## 2022-02-11 MED ORDER — HEPARIN SODIUM (PORCINE) 1000 UNIT/ML IJ SOLN
INTRAMUSCULAR | Status: AC
  Filled 2022-02-11: qty 10

## 2022-02-11 MED ORDER — ATORVASTATIN CALCIUM 10 MG PO TABS: 10.0000 mg | ORAL_TABLET | Freq: Every day | ORAL | Status: DC

## 2022-02-11 SURGICAL SUPPLY — 15 items
BAND CMPR LRG ZPHR (HEMOSTASIS) ×1
BAND ZEPHYR COMPRESS 30 LONG (HEMOSTASIS) ×1 IMPLANT
CATH BALLN WEDGE 5F 110CM (CATHETERS) ×1 IMPLANT
CATH INFINITI 5FR MULTPACK ANG (CATHETERS) ×1 IMPLANT
DRAPE BRACHIAL (DRAPES) ×2 IMPLANT
GLIDESHEATH SLEND SS 6F .021 (SHEATH) ×1 IMPLANT
GUIDEWIRE INQWIRE 1.5J.035X260 (WIRE) IMPLANT
INQWIRE 1.5J .035X260CM (WIRE) ×2
PACK CARDIAC CATH (CUSTOM PROCEDURE TRAY) ×2 IMPLANT
PAD ELECT DEFIB RADIOL ZOLL (MISCELLANEOUS) ×1 IMPLANT
PROTECTION STATION PRESSURIZED (MISCELLANEOUS) ×2
SET ATX SIMPLICITY (MISCELLANEOUS) ×1 IMPLANT
SHEATH GLIDE SLENDER 4/5FR (SHEATH) ×1 IMPLANT
STATION PROTECTION PRESSURIZED (MISCELLANEOUS) IMPLANT
WIRE HITORQ VERSACORE ST 145CM (WIRE) ×1 IMPLANT

## 2022-02-11 NOTE — Interval H&P Note (Signed)
History and Physical Interval Note:  02/11/2022 4:06 PM  Antonio Clark  has presented today for surgery, with the diagnosis of acute on chronic systolic heart failure.  The various methods of treatment have been discussed with the patient and family. After consideration of risks, benefits and other options for treatment, the patient has consented to  Procedure(s): RIGHT/LEFT HEART CATH AND CORONARY ANGIOGRAPHY (N/A) as a surgical intervention.  The patient's history has been reviewed, patient examined, no change in status, stable for surgery.  I have reviewed the patient's chart and labs.  Questions were answered to the patient's satisfaction.    Cath Lab Visit (complete for each Cath Lab visit)  Clinical Evaluation Leading to the Procedure:   ACS: No.  Non-ACS:    Anginal/Heart Failure Classification: NYHA class IV  Anti-ischemic medical therapy: Minimal Therapy (1 class of medications)  Non-Invasive Test Results: No non-invasive testing performed (LVEF 25-30% -> high risk)  Prior CABG: No previous CABG  Antonio Clark

## 2022-02-11 NOTE — Plan of Care (Signed)
  Problem: Activity: Goal: Capacity to carry out activities will improve Outcome: Progressing   Problem: Skin Integrity: Goal: Risk for impaired skin integrity will decrease Outcome: Progressing   Problem: Activity: Goal: Risk for activity intolerance will decrease Outcome: Progressing   Problem: Coping: Goal: Level of anxiety will decrease Outcome: Progressing

## 2022-02-11 NOTE — Progress Notes (Signed)
ANTICOAGULATION CONSULT NOTE -   Pharmacy Consult for Heparin Drip Indication: atrial fibrillation  No Known Allergies  Patient Measurements: Height: '5\' 9"'$  (175.3 cm) Weight: 78 kg (172 lb) IBW/kg (Calculated) : 70.7 Heparin Dosing Weight: 82.1 kg  Vital Signs: Temp: 97.9 F (36.6 C) (07/05 0355) Temp Source: Oral (07/05 0355) BP: 128/74 (07/05 0355) Pulse Rate: 99 (07/05 0355)  Labs: Recent Labs    02/08/22 1617 02/08/22 1844 02/09/22 0458 02/10/22 0748 02/10/22 1657 02/11/22 0622  HGB 12.0*  --  13.1 12.2*  --  12.9*  HCT 38.6*  --  41.7 37.7*  --  39.5  PLT 149*  --  187 150  --  153  HEPARINUNFRC  --   --   --  0.53 0.61 0.55  CREATININE 1.00  --  1.28* 1.15  --  1.22  TROPONINIHS 34* 35*  --   --   --   --      Estimated Creatinine Clearance: 45.1 mL/min (by C-G formula based on SCr of 1.22 mg/dL).   Medical History: Past Medical History:  Diagnosis Date   Anal fissure    Arthritis    Atrial fibrillation ([place])    Back pain    Colon polyps    Diabetes mellitus type 2 with complications ([place])    Dysrhythmia    a-fib   GERD (gastroesophageal reflux disease)    History of echocardiogram 07/ 07/ 2011   History of lithotripsy 1989   Hyperlipidemia    Hypertension    Kidney stones    Melanoma ([place]) 05/01/2019   right chest wall (12/20)   Prostate CA ([place]) 2010   Sleep apnea    uses CPAP nightly   Squamous cell carcinoma of skin 05/23/1992   bowens-left parietal scalp (CX35FU)   Squamous cell carcinoma of skin 03/14/2008   in situ-left upper outer forehead-medial ([id number])   Squamous cell carcinoma of skin 03/14/2008   in situ-crown of scalp ([id number])   Squamous cell carcinoma of skin 04/15/2011   in situ-right dorsal forearm (txpbx)   Squamous cell carcinoma of skin 10/16/2011   in situ-left sideburn   Squamous cell carcinoma of skin 03/11/2015   ka-left sideburn ([id number])   Squamous cell carcinoma of skin 03/11/2015   ka-left forearm ([id number])    Squamous cell carcinoma of skin 06/22/2018   in situ-left forearm, sup (txpbx)   Squamous cell carcinoma of skin 05/01/2019   in situ-mid anterior scalp    Squamous cell carcinoma of skin 05/01/2019   in situ-right upper arm  Heparin Dosing Weight: 82.1 kg  Assessment: Patient is a 86yo male on Pradaxa '150mg'$  bid for afib. Patient may need a cath during admission so cardiology is stopping Pradaxa and switching patient to Heparin drip. Pharmacy consulted for Heparin dosing.  Last dose of Pradaxa 7/3 12:17  Date Time aPTT/HL Rate/Comment 7/4  0748   0.53  Therapeutic, cont 1200 units/hr 7/4 1657 0.61  Therapeutic, 1200 un/hr 7/5       0622   0.55                 Therapeutic X 3   Goal of Therapy:  Heparin level 0.3-0.7 units/ml Monitor platelets by anticoagulation protocol: Yes   Plan:  7/5:  HL @ 0622 = 0.55, therapeutic X 3 Will continue pt on current rate and recheck HL on 7/6 with AM labs.  Deandrew Hoecker D Clinical Pharmacist 02/11/2022 7:15 AM

## 2022-02-11 NOTE — Progress Notes (Signed)
ANTICOAGULATION CONSULT NOTE - Initial Consult  Pharmacy Consult for Heparin Drip Indication: atrial fibrillation  No Known Allergies  Patient Measurements:   Heparin Dosing Weight: 82 Kg  Vital Signs: Temp: 97.4 F (36.3 C) (07/05 2132) Temp Source: Oral (07/05 2132) BP: 130/77 (07/05 2132) Pulse Rate: 91 (07/05 2132)  Labs: Recent Labs    02/09/22 0458 02/10/22 0748 02/10/22 1657 02/11/22 0622  HGB 13.1 12.2*  --  12.9*  HCT 41.7 37.7*  --  39.5  PLT 187 150  --  153  HEPARINUNFRC  --  0.53 0.61 0.55  CREATININE 1.28* 1.15  --  1.22    Estimated Creatinine Clearance: 45.1 mL/min (by C-G formula based on SCr of 1.22 mg/dL).   Medical History: Past Medical History:  Diagnosis Date   Anal fissure    Arthritis    Atrial fibrillation ([place])    Back pain    Colon polyps    Diabetes mellitus type 2 with complications ([place])    Dysrhythmia    a-fib   GERD (gastroesophageal reflux disease)    History of echocardiogram 07/ 07/ 2011   History of lithotripsy 1989   Hyperlipidemia    Hypertension    Kidney stones    Melanoma ([place]) 05/01/2019   right chest wall (12/20)   Prostate CA ([place]) 2010   Sleep apnea    uses CPAP nightly   Squamous cell carcinoma of skin 05/23/1992   bowens-left parietal scalp (CX35FU)   Squamous cell carcinoma of skin 03/14/2008   in situ-left upper outer forehead-medial ([id number])   Squamous cell carcinoma of skin 03/14/2008   in situ-crown of scalp ([id number])   Squamous cell carcinoma of skin 04/15/2011   in situ-right dorsal forearm (txpbx)   Squamous cell carcinoma of skin 10/16/2011   in situ-left sideburn   Squamous cell carcinoma of skin 03/11/2015   ka-left sideburn ([id number])   Squamous cell carcinoma of skin 03/11/2015   ka-left forearm ([id number])   Squamous cell carcinoma of skin 06/22/2018   in situ-left forearm, sup (txpbx)   Squamous cell carcinoma of skin 05/01/2019   in situ-mid anterior scalp    Squamous cell carcinoma of  skin 05/01/2019   in situ-right upper arm    Medications:  Medications Prior to Admission  Medication Sig Dispense Refill Last Dose   acetaminophen (TYLENOL) 325 MG tablet Take 2 tablets (650 mg total) by mouth every 6 (six) hours as needed for mild pain (or Fever >/= 101).      acetaminophen (TYLENOL) 650 MG suppository Place 1 suppository (650 mg total) rectally every 6 (six) hours as needed for mild pain (or Fever >/= 101). 12 suppository 0    atorvastatin (LIPITOR) 10 MG tablet Take 10 mg by mouth daily.      budesonide-formoterol (SYMBICORT) 160-4.5 MCG/ACT inhaler Inhale 2 puffs into the lungs 2 (two) times daily. 1 each 3    cetirizine (ZYRTEC) 10 MG tablet TAKE 1 TABLET BY MOUTH EVERY DAY. NOT COVERED BY INSURANCE. 90 tablet 3    diclofenac Sodium (VOLTAREN) 1 % GEL Apply 2 g topically 4 (four) times daily. 50 g 1    finasteride (PROSCAR) 5 MG tablet Take 5 mg by mouth daily.      fluticasone (FLONASE) 50 MCG/ACT nasal spray Place 1 spray into both nostrils daily as needed for allergies or rhinitis.      furosemide (LASIX) 10 MG/ML injection Inject 4 mLs (40 mg total) into the vein every 12 (twelve) hours. 4  mL 0    heparin 25000 UT/250ML infusion Inject 1,200 Units/hr into the vein continuous.      insulin aspart (NOVOLOG) 100 UNIT/ML injection Inject 0-9 Units into the skin 4 (four) times daily -  before meals and at bedtime. 10 mL 11    linagliptin (TRADJENTA) 5 MG TABS tablet Take 1 tablet (5 mg total) by mouth daily. 90 tablet 3    LORazepam (ATIVAN) 0.5 MG tablet Take 1 tablet (0.5 mg total) by mouth 2 (two) times daily as needed for anxiety. 30 tablet 1    [START ON 02/12/2022] losartan (COZAAR) 25 MG tablet Take 0.5 tablets (12.5 mg total) by mouth daily.      metoprolol tartrate (LOPRESSOR) 50 MG tablet Take 1 tablet (50 mg total) by mouth 3 (three) times daily.      mupirocin ointment (BACTROBAN) 2 % Apply 1 Application topically 2 (two) times daily. 22 g 1    ondansetron  (ZOFRAN) 4 MG tablet Take 1 tablet (4 mg total) by mouth every 6 (six) hours as needed for nausea. 20 tablet 0    ondansetron (ZOFRAN) 4 MG/2ML SOLN injection Inject 2 mLs (4 mg total) into the vein every 6 (six) hours as needed for nausea. 2 mL 0    sodium chloride 0.9 % infusion Inject 250 mLs into the vein as needed (for IV line care  (Saline / Heparin Lock)).  0    traZODone (DESYREL) 50 MG tablet Take 0.5 tablets (25 mg total) by mouth at bedtime as needed for sleep.       Assessment: Patient is a 86yo male on Pradaxa PTA (Last dose of Pradaxa 7/3). Patient now s/p cardiac cath and transferred from [facility] to [facility] for LMCA PCI on heparin drip.   It appears heparin infusion was started at [facility] ~2030 prior to transfer. [facility] RN stopped infusion upon arrival as there was not an active order when the patient arrived to the floor. Heparin has been resumed at prior rate and level will be collected in ~6 hours.   Goal of Therapy:  Heparin level 0.3-0.7 units/ml Monitor platelets by anticoagulation protocol: Yes   Plan:  Heparin gtt 1200 units/hr Heparin level and aPTT in 6 hours Daily CBC  Iraida Cragin L Liddy Deam 02/11/2022,11:31 PM

## 2022-02-11 NOTE — Progress Notes (Signed)
  Transition of Care [facility]) Screening Note   Patient Details  Name: Antonio Clark Date of Birth: 1936-07-28   Transition of Care [facility]) CM/SW Contact:    Alberteen Sam, LCSW Phone Number: 02/11/2022, 12:00 PM    Transition of Care Department [facility]) has reviewed patient and no TOC needs have been identified at this time. We will continue to monitor patient advancement through interdisciplinary progression rounds. If new patient transition needs arise, please place a TOC consult.  [place], [place]

## 2022-02-11 NOTE — H&P (View-Only) (Signed)
PROGRESS NOTE    Antonio Clark  WUJ:[id number] DOB: 12-16-1935  DOA: 02/08/2022 Date of Service: 02/11/22 PCP: Susy Frizzle, MD     Brief Narrative / Hospital Course:  86 y.o. Caucasian male with medical history significant for GERD, hypertension, dyslipidemia, urolithiasis, OSA on CPAP, and atrial fibrillation on Pradaxa, who presented to the emergency room 02/08/2022 with acute onset of worsening dyspnea with associated dry cough occasionally productive of clear sputum with occasional blood tinges as well as wheezing for the last 5 to 6 days. 07/02-07/04: admitted and treated for acute CHF Repeat ejection fraction 25 to 30%, down significantly from 55 to 60% from prior echocardiogram.  Cardiology following.  Planning inpatient ischemic evaluation. 07/05: L/R Cardiac Cath planned for this afternoon     Consultants:  Cardiology  Procedures: [Planned L/R Cardiac Cath 02/11/22]    Subjective: Patient reports doing well, no CP/SOB at this time. Anxious to get the cath done so he can eat.      ASSESSMENT & PLAN:   Principal Problem:   Acute CHF (congestive heart failure) (HCC) Active Problems:   Hypertensive urgency   Paroxysmal atrial fibrillation with rapid ventricular response (HCC)   Hyperlipidemia   Coronary artery disease   Type 2 diabetes mellitus with complication, without long-term current use of insulin ([place])  New onset decompensated systolic congestive heart failure Bilateral pleural effusions Volume overload Hypokalemia Started on IV diuresis on admission  Cardiology [facility] has been consulted  Echocardiogram 25 to 30% down from prior 55 to 50%  V furosemide 40 mg twice daily Strict ins and outs, Daily weights Monitor and replace potassium and magnesium aggressively Low-dose losartan added per cardiology Outpatient plan to switch to [facility] as BP allows Plan for left and right heart catheterization 7/5 with possible PCI Home Pradaxa held and on  IV heparin in preparation for procedure   Elevated troponin Not consistent with ACS On heparin GTT as above Cardiac catheterization as above   Hypertensive urgency Possible underlying etiology of congestive heart failure Improved Continue Lopressor Low-dose losartan added 7/4 As needed IV labetalol Diuresis as above   Persistent atrial fibrillation with rapid ventricular response Rate control improved Continue Lopressor Heparin GTT as above Pradaxa on hold Telemetry monitoring   Type 2 diabetes mellitus without insulin use Continue home Tradjenta SSI x1 day   Coronary artery disease Hyperlipidemia PTA beta-blocker and statin      DVT prophylaxis: Pradaxa Code Status: FULL Family Communication: family at bedside on rounds  Disposition Plan / TOC needs: anticipate return to home envornment vs HH/SNF needs eval pending clinical improvement  Barriers to discharge / significant pending items: planning for cardiac cath today 02/11/22 await cardiology recs based on this              Objective: Vitals:   02/11/22 0355 02/11/22 0425 02/11/22 0737 02/11/22 0940  BP: 128/74  123/78   Pulse: 99  (!) 53   Resp: 20  16 (!) 23  Temp: 97.9 F (36.6 C)  97.9 F (36.6 C)   TempSrc: Oral  Oral   SpO2: 94%  98%   Weight:  78 kg    Height:        Intake/Output Summary (Last 24 hours) at 02/11/2022 0940 Last data filed at 02/11/2022 0729 Gross per 24 hour  Intake 304.14 ml  Output 1875 ml  Net -1570.86 ml   Filed Weights   02/08/22 1614 02/10/22 0421 02/11/22 0425  Weight: 82.1 kg 79.6 kg  78 kg    Examination:  Constitutional:  VS as above General Appearance: alert, well-developed, well-nourished, NAD Ears, Nose, Mouth, Throat: Normal appearance Neck: No masses, trachea midline Respiratory: Normal respiratory effort Breath sounds normal, no wheeze/rhonchi/rales Cardiovascular: S1/S2 normal, no murmur/rub/gallop auscultated No lower extremity  edema Gastrointestinal: Nontender, no masses Musculoskeletal:  No clubbing/cyanosis of digits Neurological: No cranial nerve deficit on limited exam Psychiatric: Normal judgment/insight Normal mood and affect       Scheduled Medications:   atorvastatin  10 mg Oral Daily   diclofenac Sodium  2 g Topical QID   finasteride  5 mg Oral Daily   furosemide  40 mg Intravenous Q12H   insulin aspart  0-9 Units Subcutaneous TID AC & HS   linagliptin  5 mg Oral Daily   loratadine  10 mg Oral Daily   losartan  12.5 mg Oral Daily   metoprolol tartrate  50 mg Oral BID   mupirocin ointment  1 Application Topical BID   sodium chloride flush  3 mL Intravenous Q12H    Continuous Infusions:  sodium chloride     sodium chloride 10 mL/hr at 02/11/22 0729   heparin 1,200 Units/hr (02/11/22 0729)    PRN Medications:  sodium chloride, acetaminophen **OR** acetaminophen, fluticasone, LORazepam, magnesium hydroxide, ondansetron **OR** ondansetron (ZOFRAN) IV, sodium chloride flush, traZODone  Antimicrobials:  Anti-infectives (From admission, onward)    None       Data Reviewed: I have personally reviewed following labs and imaging studies  CBC: Recent Labs  Lab 02/08/22 1617 02/09/22 0458 02/10/22 0748 02/11/22 0622  WBC 6.7 8.6 6.6 7.2  HGB 12.0* 13.1 12.2* 12.9*  HCT 38.6* 41.7 37.7* 39.5  MCV 91.0 90.5 87.9 87.8  PLT 149* 187 150 539   Basic Metabolic Panel: Recent Labs  Lab 02/08/22 1617 02/09/22 0458 02/10/22 0748 02/11/22 0622  NA 142 142 138 140  K 3.6 3.1* 3.4* 4.1  CL 109 109 105 106  CO2 '24 26 24 23  '$ GLUCOSE 173* 128* 121* 143*  BUN '18 19 21 '$ 29*  CREATININE 1.00 1.28* 1.15 1.22  CALCIUM 9.4 9.6 8.9 9.1  MG  --   --  1.6*  --    GFR: Estimated Creatinine Clearance: 45.1 mL/min (by C-G formula based on SCr of 1.22 mg/dL). Liver Function Tests: No results for input(s): "AST", "ALT", "ALKPHOS", "BILITOT", "PROT", "ALBUMIN" in the last 168 hours. No  results for input(s): "LIPASE", "AMYLASE" in the last 168 hours. No results for input(s): "AMMONIA" in the last 168 hours. Coagulation Profile: No results for input(s): "INR", "PROTIME" in the last 168 hours. Cardiac Enzymes: No results for input(s): "CKTOTAL", "CKMB", "CKMBINDEX", "TROPONINI" in the last 168 hours. BNP (last 3 results) No results for input(s): "PROBNP" in the last 8760 hours. HbA1C: Recent Labs    02/09/22 0458  HGBA1C 6.4*   CBG: Recent Labs  Lab 02/10/22 0725 02/10/22 1154 02/10/22 1626 02/10/22 2059 02/11/22 0738  GLUCAP 116* 144* 121* 176* 137*   Lipid Profile: No results for input(s): "CHOL", "HDL", "LDLCALC", "TRIG", "CHOLHDL", "LDLDIRECT" in the last 72 hours. Thyroid Function Tests: Recent Labs    02/09/22 0458  TSH 3.207   Anemia Panel: No results for input(s): "VITAMINB12", "FOLATE", "FERRITIN", "TIBC", "IRON", "RETICCTPCT" in the last 72 hours. Urine analysis:    Component Value Date/Time   COLORURINE YELLOW 10/30/2021 1203   APPEARANCEUR CLOUDY (A) 10/30/2021 1203   LABSPEC 1.020 10/30/2021 1203   PHURINE < OR = 5.0 10/30/2021 1203  GLUCOSEU NEGATIVE 10/30/2021 1203   HGBUR 3+ (A) 10/30/2021 1203   BILIRUBINUR NEGATIVE 01/21/2011 1130   KETONESUR NEGATIVE 10/30/2021 1203   PROTEINUR 2+ (A) 10/30/2021 1203   UROBILINOGEN 0.2 01/21/2011 1130   NITRITE POSITIVE (A) 10/30/2021 1203   LEUKOCYTESUR 2+ (A) 10/30/2021 1203   Sepsis Labs: '@LABRCNTIP'$ (procalcitonin:4,lacticidven:4)  Recent Results (from the past 240 hour(s))  SARS Coronavirus 2 by RT PCR (hospital order, performed in [place] hospital lab) *cepheid single result test* Anterior Nasal Swab     Status: None   Collection Time: 02/08/22  5:22 PM   Specimen: Anterior Nasal Swab  Result Value Ref Range Status   SARS Coronavirus 2 by RT PCR NEGATIVE NEGATIVE Final    Comment: (NOTE) SARS-CoV-2 target nucleic acids are NOT DETECTED.  The SARS-CoV-2 RNA is generally  detectable in upper and lower respiratory specimens during the acute phase of infection. The lowest concentration of SARS-CoV-2 viral copies this assay can detect is 250 copies / mL. A negative result does not preclude SARS-CoV-2 infection and should not be used as the sole basis for treatment or other patient management decisions.  A negative result may occur with improper specimen collection / handling, submission of specimen other than nasopharyngeal swab, presence of viral mutation(s) within the areas targeted by this assay, and inadequate number of viral copies (<250 copies / mL). A negative result must be combined with clinical observations, patient history, and epidemiological information.  Fact Sheet for Patients:   https://www.patel.info/  Fact Sheet for Healthcare Providers: https://hall.com/  This test is not yet approved or  cleared by the [place] FDA and has been authorized for detection and/or diagnosis of SARS-CoV-2 by FDA under an Emergency Use Authorization (EUA).  This EUA will remain in effect (meaning this test can be used) for the duration of the COVID-19 declaration under Section 564(b)(1) of the Act, 21 U.S.C. section 360bbb-3(b)(1), unless the authorization is terminated or revoked sooner.  Performed at [facility], [address], [place], [place] [address]          Radiology Studies last 96 hours: ECHOCARDIOGRAM COMPLETE  Result Date: 02/09/2022    ECHOCARDIOGRAM REPORT   Patient Name:   Antonio Clark Sindelar Date of Exam: 02/09/2022 Medical Rec #:  [id number]     Height:       69.0 in Accession #:    [id number]    Weight:       181.0 lb Date of Birth:  12/26/1935     BSA:          1.980 m Patient Age:    63 years      BP:           151/109 mmHg Patient Gender: M             HR:           95 bpm. Exam Location:  [facility] Procedure: 2D Echo, Cardiac Doppler and Color Doppler Indications:     I50.31 CHF acute  diastolic  History:         Patient has prior history of Echocardiogram examinations, most                  recent 04/10/2020. CHF, CAD, History of prostate cancer,                  Arrythmias:Atrial Fibrillation; Risk Factors:Diabetes,                  Dyslipidemia, Hypertension  and Sleep Apnea.  Sonographer:     Rosalia Hammers Referring Phys:  [id number] Rise Mu Diagnosing Phys: Kathlyn Sacramento MD IMPRESSIONS  1. Left ventricular ejection fraction, by estimation, is 25 to 30%. The left ventricle has severely decreased function. The left ventricle demonstrates global hypokinesis. The left ventricular internal cavity size was mildly dilated. There is moderate left ventricular hypertrophy. Left ventricular diastolic parameters are indeterminate.  2. Right ventricular systolic function is normal. The right ventricular size is normal. There is mildly elevated pulmonary artery systolic pressure.  3. Left atrial size was moderately dilated.  4. Right atrial size was moderately dilated.  5. The mitral valve is normal in structure. Mild to moderate mitral valve regurgitation. No evidence of mitral stenosis. Moderate mitral annular calcification.  6. Tricuspid valve regurgitation is moderate.  7. The aortic valve is normal in structure. Aortic valve regurgitation is mild. Aortic valve sclerosis/calcification is present, without any evidence of aortic stenosis.  8. Pulmonic valve regurgitation is moderate.  9. The inferior vena cava is normal in size with <50% respiratory variability, suggesting right atrial pressure of 8 mmHg. FINDINGS  Left Ventricle: Left ventricular ejection fraction, by estimation, is 25 to 30%. The left ventricle has severely decreased function. The left ventricle demonstrates global hypokinesis. The left ventricular internal cavity size was mildly dilated. There is moderate left ventricular hypertrophy. Left ventricular diastolic parameters are indeterminate. Right Ventricle: The right ventricular size is  normal. No increase in right ventricular wall thickness. Right ventricular systolic function is normal. There is mildly elevated pulmonary artery systolic pressure. The tricuspid regurgitant velocity is 2.99  m/s, and with an assumed right atrial pressure of 8 mmHg, the estimated right ventricular systolic pressure is 89.3 mmHg. Left Atrium: Left atrial size was moderately dilated. Right Atrium: Right atrial size was moderately dilated. Pericardium: There is no evidence of pericardial effusion. Mitral Valve: The mitral valve is normal in structure. Moderate mitral annular calcification. Mild to moderate mitral valve regurgitation. No evidence of mitral valve stenosis. Tricuspid Valve: The tricuspid valve is normal in structure. Tricuspid valve regurgitation is moderate . No evidence of tricuspid stenosis. Aortic Valve: The aortic valve is normal in structure. Aortic valve regurgitation is mild. Aortic regurgitation PHT measures 470 msec. Aortic valve sclerosis/calcification is present, without any evidence of aortic stenosis. Aortic valve mean gradient measures 2.0 mmHg. Aortic valve peak gradient measures 2.5 mmHg. Aortic valve area, by VTI measures 2.03 cm. Pulmonic Valve: The pulmonic valve was normal in structure. Pulmonic valve regurgitation is moderate. No evidence of pulmonic stenosis. Aorta: The aortic root is normal in size and structure. Venous: The inferior vena cava is normal in size with less than 50% respiratory variability, suggesting right atrial pressure of 8 mmHg. IAS/Shunts: No atrial level shunt detected by color flow Doppler.  LEFT VENTRICLE PLAX 2D LVIDd:         5.18 cm LVIDs:         3.97 cm LV PW:         1.41 cm LV IVS:        1.38 cm LVOT diam:     2.00 cm LV SV:         28 LV SV Index:   14 LVOT Area:     3.14 cm  RIGHT VENTRICLE RV Basal diam:  4.12 cm LEFT ATRIUM             Index        RIGHT ATRIUM  Index LA diam:        4.20 cm 2.12 cm/m   RA Area:     23.50 cm LA Vol  (A2C):   65.4 ml 33.03 ml/m  RA Volume:   76.20 ml  38.48 ml/m LA Vol (A4C):   52.0 ml 26.26 ml/m LA Biplane Vol: 59.6 ml 30.10 ml/m  AORTIC VALVE                    PULMONIC VALVE AV Area (Vmax):    2.07 cm     PR End Diast Vel: 5.20 msec AV Area (Vmean):   1.98 cm AV Area (VTI):     2.03 cm AV Vmax:           78.40 cm/s AV Vmean:          57.400 cm/s AV VTI:            0.136 m AV Peak Grad:      2.5 mmHg AV Mean Grad:      2.0 mmHg LVOT Vmax:         51.70 cm/s LVOT Vmean:        36.200 cm/s LVOT VTI:          0.088 m LVOT/AV VTI ratio: 0.65 AI PHT:            470 msec  AORTA Ao Root diam: 3.50 cm MITRAL VALVE               TRICUSPID VALVE MV Area (PHT): 3.24 cm    TR Peak grad:   35.8 mmHg MV Decel Time: 234 msec    TR Vmax:        299.00 cm/s MV E velocity: 68.20 cm/s                            SHUNTS                            Systemic VTI:  0.09 m                            Systemic Diam: 2.00 cm Kathlyn Sacramento MD Electronically signed by Kathlyn Sacramento MD Signature Date/Time: 02/09/2022/5:10:19 PM    Final    CT Angio Chest PE W and/or Wo Contrast  Result Date: 02/08/2022 CLINICAL DATA:  Pulmonary embolism (PE) suspected, high prob EXAM: CT ANGIOGRAPHY CHEST WITH CONTRAST TECHNIQUE: Multidetector CT imaging of the chest was performed using the standard protocol during bolus administration of intravenous contrast. Multiplanar CT image reconstructions and MIPs were obtained to evaluate the vascular anatomy. RADIATION DOSE REDUCTION: This exam was performed according to the departmental dose-optimization program which includes automated exposure control, adjustment of the mA and/or kV according to patient size and/or use of iterative reconstruction technique. CONTRAST:  45m OMNIPAQUE IOHEXOL 350 MG/ML SOLN COMPARISON:  None Available. FINDINGS: Cardiovascular: Satisfactory opacification of the pulmonary arteries to the segmental level. No evidence of pulmonary embolism. The main pulmonary artery  measures 3.1 cm. Prominent heart size. No significant pericardial effusion. The thoracic aorta is normal in caliber. No atherosclerotic plaque of the thoracic aorta. No coronary artery calcifications. Mediastinum/Nodes: No enlarged mediastinal, hilar, or axillary lymph nodes. Thyroid gland, trachea, and esophagus demonstrate no significant findings. Lungs/Pleura: Expiratory phase of respiration with mosaic attenuation of the lungs. No focal consolidation. No pulmonary nodule.  No pulmonary mass. Bilateral small to moderate, right greater than left, pleural effusions. No pneumothorax. Diffuse mild bronchial wall thickening. Upper Abdomen: No acute abnormality. Musculoskeletal: No chest wall abnormality. No suspicious lytic or blastic osseous lesions. No acute displaced fracture. Multilevel degenerative changes of the spine. Review of the MIP images confirms the above findings. IMPRESSION: 1. Bilateral small to moderate, right greater than left, pleural effusions. 2. Bronchial wall thickening suggestive of small airway disease. 3. Cardiomegaly. Electronically Signed   By: Iven Finn M.D.   On: 02/08/2022 18:44   DG Chest 2 View  Result Date: 02/08/2022 CLINICAL DATA:  sob EXAM: CHEST - 2 VIEW COMPARISON:  None Available. FINDINGS: The heart and mediastinal contours are unchanged. Prominent hilar vasculature. No focal consolidation. Increased interstitial markings. Bilateral trace pleural effusion. No pneumothorax. No acute osseous abnormality.  Right chest wall vascular clips. IMPRESSION: Pulmonary edema.  Bilateral trace pleural effusions. Electronically Signed   By: Iven Finn M.D.   On: 02/08/2022 17:00            LOS: 3 days      Emeterio Reeve, DO [facility] 02/11/2022, 9:40 AM   Staff may message me via secure chat in [place]  but this may not receive immediate response,  please page for urgent matters!  If 7PM-7AM, please contact night-coverage [email or web address]  Dictation  software was used to generate the above note. Typos may occur and escape review, as with typed/written notes. Please contact Dr Sheppard Coil directly for clarity if needed.

## 2022-02-11 NOTE — Discharge Summary (Signed)
Physician Discharge Summary   Patient: Antonio Clark MRN: [id number]  DOB: November 08, 1935   Admit:     Date of Admission: 02/08/2022 Admitted from: home   Discharge: Date of discharge: 02/11/22 Disposition:  [place] Condition at discharge: stable  CODE STATUS: FULL     Discharge Physician: Emeterio Reeve, DO [facility]     PCP: Susy Frizzle, MD  Recommendations for Outpatient Follow-up:  TBD   Hospital Course:  86 y.o. Caucasian male with medical history significant for GERD, hypertension, dyslipidemia, urolithiasis, OSA on CPAP, and atrial fibrillation on Pradaxa, who presented to the emergency room 02/08/2022 with acute onset of worsening dyspnea with associated dry cough occasionally productive of clear sputum with occasional blood tinges as well as wheezing for the last 5 to 6 days. 07/02-07/04: admitted and treated for acute CHF Repeat ejection fraction 25 to 30%, down significantly from 55 to 60% from prior echocardiogram.  Cardiology following.  Planning inpatient ischemic evaluation. 07/05: L/R Cardiac Cath: (+)significant ostial/proximal LMCA stenosis which could explain his low EF. Per Dr.End, recommend transfer to [facility] for LMCA PCI. Dr. Martinique is the accepting MD     Consultants:  Cardiology   Procedures: L/R Cardiac Cath 02/11/22   Discharge Diagnoses: Principal Problem:   Acute CHF (congestive heart failure) ([place]) Active Problems:   Hypertensive urgency   Paroxysmal atrial fibrillation with rapid ventricular response (HCC)   Hyperlipidemia   Coronary artery disease   Type 2 diabetes mellitus with complication, without long-term current use of insulin (HCC)   Acute HFrEF (heart failure with reduced ejection fraction) ([place])    Assessment & Plan:  New onset decompensated systolic congestive heart failure Bilateral pleural effusions Volume overload Hypokalemia Started on IV diuresis on admission  Echocardiogram 25 to 30% down  from prior 55 to 50%  IV furosemide 40 mg twice daily Strict ins and outs, Daily weights Monitor and replace potassium and magnesium aggressively Low-dose losartan added per cardiology Outpatient plan to switch to [facility] as BP allows Plan for left and right heart catheterization 7/5 with possible PCI --> see above, transferring to [facility] w/ Dr Martinique  Home Pradaxa held and on IV heparin in preparation for procedure   Elevated troponin Not consistent with ACS On heparin GTT as above Cardiac catheterization as above   Hypertensive urgency Possible underlying etiology of congestive heart failure Improved Continue Lopressor Low-dose losartan added 7/4 As needed IV labetalol Diuresis as above   Persistent atrial fibrillation with rapid ventricular response Rate control improved Continue Lopressor Heparin GTT as above Pradaxa on hold Telemetry monitoring   Type 2 diabetes mellitus without insulin use Continue home Tradjenta SSI x1 day   Coronary artery disease Hyperlipidemia PTA beta-blocker and statin     Discharge Instructions  Allergies as of 02/11/2022   No Known Allergies      Medication List     STOP taking these medications    Pradaxa 150 MG Caps capsule Generic drug: dabigatran       TAKE these medications    acetaminophen 325 MG tablet Commonly known as: TYLENOL Take 2 tablets (650 mg total) by mouth every 6 (six) hours as needed for mild pain (or Fever >/= 101).   acetaminophen 650 MG suppository Commonly known as: TYLENOL Place 1 suppository (650 mg total) rectally every 6 (six) hours as needed for mild pain (or Fever >/= 101).   atorvastatin 10 MG tablet Commonly known as: LIPITOR Take 10 mg by mouth daily.  budesonide-formoterol 160-4.5 MCG/ACT inhaler Commonly known as: SYMBICORT Inhale 2 puffs into the lungs 2 (two) times daily.   cetirizine 10 MG tablet Commonly known as: ZYRTEC TAKE 1 TABLET BY MOUTH EVERY DAY. NOT COVERED BY  INSURANCE.   diclofenac Sodium 1 % Gel Commonly known as: Voltaren Apply 2 g topically 4 (four) times daily.   finasteride 5 MG tablet Commonly known as: PROSCAR Take 5 mg by mouth daily.   fluticasone 50 MCG/ACT nasal spray Commonly known as: FLONASE Place 1 spray into both nostrils daily as needed for allergies or rhinitis.   furosemide 10 MG/ML injection Commonly known as: LASIX Inject 4 mLs (40 mg total) into the vein every 12 (twelve) hours.   heparin 25000 UT/250ML infusion Inject 1,200 Units/hr into the vein continuous.   insulin aspart 100 UNIT/ML injection Commonly known as: novoLOG Inject 0-9 Units into the skin 4 (four) times daily -  before meals and at bedtime.   linagliptin 5 MG Tabs tablet Commonly known as: Tradjenta Take 1 tablet (5 mg total) by mouth daily.   LORazepam 0.5 MG tablet Commonly known as: ATIVAN Take 1 tablet (0.5 mg total) by mouth 2 (two) times daily as needed for anxiety.   losartan 25 MG tablet Commonly known as: COZAAR Take 0.5 tablets (12.5 mg total) by mouth daily. Start taking on: February 12, 2022   metoprolol tartrate 50 MG tablet Commonly known as: LOPRESSOR Take 1 tablet (50 mg total) by mouth 3 (three) times daily. What changed:  medication strength how much to take when to take this   mupirocin ointment 2 % Commonly known as: BACTROBAN Apply 1 Application topically 2 (two) times daily.   ondansetron 4 MG tablet Commonly known as: ZOFRAN Take 1 tablet (4 mg total) by mouth every 6 (six) hours as needed for nausea.   ondansetron 4 MG/2ML Soln injection Commonly known as: ZOFRAN Inject 2 mLs (4 mg total) into the vein every 6 (six) hours as needed for nausea.   sodium chloride 0.9 % infusion Inject 250 mLs into the vein as needed (for IV line care  (Saline / Heparin Lock)).   traZODone 50 MG tablet Commonly known as: DESYREL Take 0.5 tablets (25 mg total) by mouth at bedtime as needed for sleep.       Subjective:   Patient reports doing well, no CP/SOB.   No Known Allergies     Discharge Exam: Vitals:   02/11/22 1652 02/11/22 1715  BP: 114/77 124/82  Pulse: (!) 106 98  Resp: 19 (!) 23  Temp:    SpO2: 91% 100%   Vitals:   02/11/22 1642 02/11/22 1647 02/11/22 1652 02/11/22 1715  BP: 117/75 124/79 114/77 124/82  Pulse: 100 (!) 103 (!) 106 98  Resp: (!) 21 (!) 28 19 (!) 23  Temp:      TempSrc:      SpO2: 100% 100% 91% 100%  Weight:      Height:         General: Pt is alert, awake, not in acute distress Cardiovascular: RRR, S1/S2 +, no rubs, no gallops Respiratory: CTA bilaterally, no wheezing, no rhonchi Abdominal: Soft, NT, ND, bowel sounds + Extremities: no edema, no cyanosis     The results of significant diagnostics from this hospitalization (including imaging, microbiology, ancillary and laboratory) are listed below for reference.     Microbiology: Recent Results (from the past 240 hour(s))  SARS Coronavirus 2 by RT PCR (hospital order, performed in [facility] hospital  lab) *cepheid single result test* Anterior Nasal Swab     Status: None   Collection Time: 02/08/22  5:22 PM   Specimen: Anterior Nasal Swab  Result Value Ref Range Status   SARS Coronavirus 2 by RT PCR NEGATIVE NEGATIVE Final    Comment: (NOTE) SARS-CoV-2 target nucleic acids are NOT DETECTED.  The SARS-CoV-2 RNA is generally detectable in upper and lower respiratory specimens during the acute phase of infection. The lowest concentration of SARS-CoV-2 viral copies this assay can detect is 250 copies / mL. A negative result does not preclude SARS-CoV-2 infection and should not be used as the sole basis for treatment or other patient management decisions.  A negative result may occur with improper specimen collection / handling, submission of specimen other than nasopharyngeal swab, presence of viral mutation(s) within the areas targeted by this assay, and inadequate number of viral copies (<250  copies / mL). A negative result must be combined with clinical observations, patient history, and epidemiological information.  Fact Sheet for Patients:   https://www.patel.info/  Fact Sheet for Healthcare Providers: https://hall.com/  This test is not yet approved or  cleared by the [place] FDA and has been authorized for detection and/or diagnosis of SARS-CoV-2 by FDA under an Emergency Use Authorization (EUA).  This EUA will remain in effect (meaning this test can be used) for the duration of the COVID-19 declaration under Section 564(b)(1) of the Act, 21 U.S.C. section 360bbb-3(b)(1), unless the authorization is terminated or revoked sooner.  Performed at [facility], [address] [place], [place] [address]      Labs: BNP (last 3 results) Recent Labs    02/08/22 1627  BNP 6,144.3*   Basic Metabolic Panel: Recent Labs  Lab 02/08/22 1617 02/09/22 0458 02/10/22 0748 02/11/22 0622  NA 142 142 138 140  K 3.6 3.1* 3.4* 4.1  CL 109 109 105 106  CO2 '24 26 24 23  '$ GLUCOSE 173* 128* 121* 143*  BUN '18 19 21 '$ 29*  CREATININE 1.00 1.28* 1.15 1.22  CALCIUM 9.4 9.6 8.9 9.1  MG  --   --  1.6*  --    Liver Function Tests: No results for input(s): "AST", "ALT", "ALKPHOS", "BILITOT", "PROT", "ALBUMIN" in the last 168 hours. No results for input(s): "LIPASE", "AMYLASE" in the last 168 hours. No results for input(s): "AMMONIA" in the last 168 hours. CBC: Recent Labs  Lab 02/08/22 1617 02/09/22 0458 02/10/22 0748 02/11/22 0622  WBC 6.7 8.6 6.6 7.2  HGB 12.0* 13.1 12.2* 12.9*  HCT 38.6* 41.7 37.7* 39.5  MCV 91.0 90.5 87.9 87.8  PLT 149* 187 150 153   Cardiac Enzymes: No results for input(s): "CKTOTAL", "CKMB", "CKMBINDEX", "TROPONINI" in the last 168 hours. BNP: Invalid input(s): "POCBNP" CBG: Recent Labs  Lab 02/10/22 1154 02/10/22 1626 02/10/22 2059 02/11/22 0738 02/11/22 1141  GLUCAP 144* 121* 176*  137* 145*   D-Dimer No results for input(s): "DDIMER" in the last 72 hours. Hgb A1c Recent Labs    02/09/22 0458  HGBA1C 6.4*   Lipid Profile No results for input(s): "CHOL", "HDL", "LDLCALC", "TRIG", "CHOLHDL", "LDLDIRECT" in the last 72 hours. Thyroid function studies Recent Labs    02/09/22 0458  TSH 3.207   Anemia work up No results for input(s): "VITAMINB12", "FOLATE", "FERRITIN", "TIBC", "IRON", "RETICCTPCT" in the last 72 hours. Urinalysis    Component Value Date/Time   COLORURINE YELLOW 10/30/2021 1203   APPEARANCEUR CLOUDY (A) 10/30/2021 1203   LABSPEC 1.020 10/30/2021 1203  PHURINE < OR = 5.0 10/30/2021 1203   GLUCOSEU NEGATIVE 10/30/2021 1203   HGBUR 3+ (A) 10/30/2021 1203   BILIRUBINUR NEGATIVE 01/21/2011 1130   KETONESUR NEGATIVE 10/30/2021 1203   PROTEINUR 2+ (A) 10/30/2021 1203   UROBILINOGEN 0.2 01/21/2011 1130   NITRITE POSITIVE (A) 10/30/2021 1203   LEUKOCYTESUR 2+ (A) 10/30/2021 1203   Sepsis Labs Recent Labs  Lab 02/08/22 1617 02/09/22 0458 02/10/22 0748 02/11/22 0622  WBC 6.7 8.6 6.6 7.2   Microbiology Recent Results (from the past 240 hour(s))  SARS Coronavirus 2 by RT PCR (hospital order, performed in [place] hospital lab) *cepheid single result test* Anterior Nasal Swab     Status: None   Collection Time: 02/08/22  5:22 PM   Specimen: Anterior Nasal Swab  Result Value Ref Range Status   SARS Coronavirus 2 by RT PCR NEGATIVE NEGATIVE Final    Comment: (NOTE) SARS-CoV-2 target nucleic acids are NOT DETECTED.  The SARS-CoV-2 RNA is generally detectable in upper and lower respiratory specimens during the acute phase of infection. The lowest concentration of SARS-CoV-2 viral copies this assay can detect is 250 copies / mL. A negative result does not preclude SARS-CoV-2 infection and should not be used as the sole basis for treatment or other patient management decisions.  A negative result may occur with improper specimen collection  / handling, submission of specimen other than nasopharyngeal swab, presence of viral mutation(s) within the areas targeted by this assay, and inadequate number of viral copies (<250 copies / mL). A negative result must be combined with clinical observations, patient history, and epidemiological information.  Fact Sheet for Patients:   https://www.patel.info/  Fact Sheet for Healthcare Providers: https://hall.com/  This test is not yet approved or  cleared by the [place] FDA and has been authorized for detection and/or diagnosis of SARS-CoV-2 by FDA under an Emergency Use Authorization (EUA).  This EUA will remain in effect (meaning this test can be used) for the duration of the COVID-19 declaration under Section 564(b)(1) of the Act, 21 U.S.C. section 360bbb-3(b)(1), unless the authorization is terminated or revoked sooner.  Performed at [facility], [address] [place], [place] [address]    Imaging ECHOCARDIOGRAM COMPLETE  Result Date: 02/09/2022    ECHOCARDIOGRAM REPORT   Patient Name:   CHRSTOPHER MALENFANT Arp Date of Exam: 02/09/2022 Medical Rec #:  [id number]     Height:       69.0 in Accession #:    [id number]    Weight:       181.0 lb Date of Birth:  February 07, 1936     BSA:          1.980 m Patient Age:    49 years      BP:           151/109 mmHg Patient Gender: M             HR:           95 bpm. Exam Location:  [facility] Procedure: 2D Echo, Cardiac Doppler and Color Doppler Indications:     I50.31 CHF acute diastolic  History:         Patient has prior history of Echocardiogram examinations, most                  recent 04/10/2020. CHF, CAD, History of prostate cancer,                  Arrythmias:Atrial Fibrillation; Risk Factors:Diabetes,  Dyslipidemia, Hypertension and Sleep Apnea.  Sonographer:     Rosalia Hammers Referring Phys:  [id number] Rise Mu Diagnosing Phys: Kathlyn Sacramento MD IMPRESSIONS  1. Left ventricular ejection  fraction, by estimation, is 25 to 30%. The left ventricle has severely decreased function. The left ventricle demonstrates global hypokinesis. The left ventricular internal cavity size was mildly dilated. There is moderate left ventricular hypertrophy. Left ventricular diastolic parameters are indeterminate.  2. Right ventricular systolic function is normal. The right ventricular size is normal. There is mildly elevated pulmonary artery systolic pressure.  3. Left atrial size was moderately dilated.  4. Right atrial size was moderately dilated.  5. The mitral valve is normal in structure. Mild to moderate mitral valve regurgitation. No evidence of mitral stenosis. Moderate mitral annular calcification.  6. Tricuspid valve regurgitation is moderate.  7. The aortic valve is normal in structure. Aortic valve regurgitation is mild. Aortic valve sclerosis/calcification is present, without any evidence of aortic stenosis.  8. Pulmonic valve regurgitation is moderate.  9. The inferior vena cava is normal in size with <50% respiratory variability, suggesting right atrial pressure of 8 mmHg. FINDINGS  Left Ventricle: Left ventricular ejection fraction, by estimation, is 25 to 30%. The left ventricle has severely decreased function. The left ventricle demonstrates global hypokinesis. The left ventricular internal cavity size was mildly dilated. There is moderate left ventricular hypertrophy. Left ventricular diastolic parameters are indeterminate. Right Ventricle: The right ventricular size is normal. No increase in right ventricular wall thickness. Right ventricular systolic function is normal. There is mildly elevated pulmonary artery systolic pressure. The tricuspid regurgitant velocity is 2.99  m/s, and with an assumed right atrial pressure of 8 mmHg, the estimated right ventricular systolic pressure is 85.2 mmHg. Left Atrium: Left atrial size was moderately dilated. Right Atrium: Right atrial size was moderately dilated.  Pericardium: There is no evidence of pericardial effusion. Mitral Valve: The mitral valve is normal in structure. Moderate mitral annular calcification. Mild to moderate mitral valve regurgitation. No evidence of mitral valve stenosis. Tricuspid Valve: The tricuspid valve is normal in structure. Tricuspid valve regurgitation is moderate . No evidence of tricuspid stenosis. Aortic Valve: The aortic valve is normal in structure. Aortic valve regurgitation is mild. Aortic regurgitation PHT measures 470 msec. Aortic valve sclerosis/calcification is present, without any evidence of aortic stenosis. Aortic valve mean gradient measures 2.0 mmHg. Aortic valve peak gradient measures 2.5 mmHg. Aortic valve area, by VTI measures 2.03 cm. Pulmonic Valve: The pulmonic valve was normal in structure. Pulmonic valve regurgitation is moderate. No evidence of pulmonic stenosis. Aorta: The aortic root is normal in size and structure. Venous: The inferior vena cava is normal in size with less than 50% respiratory variability, suggesting right atrial pressure of 8 mmHg. IAS/Shunts: No atrial level shunt detected by color flow Doppler.  LEFT VENTRICLE PLAX 2D LVIDd:         5.18 cm LVIDs:         3.97 cm LV PW:         1.41 cm LV IVS:        1.38 cm LVOT diam:     2.00 cm LV SV:         28 LV SV Index:   14 LVOT Area:     3.14 cm  RIGHT VENTRICLE RV Basal diam:  4.12 cm LEFT ATRIUM             Index        RIGHT ATRIUM  Index LA diam:        4.20 cm 2.12 cm/m   RA Area:     23.50 cm LA Vol (A2C):   65.4 ml 33.03 ml/m  RA Volume:   76.20 ml  38.48 ml/m LA Vol (A4C):   52.0 ml 26.26 ml/m LA Biplane Vol: 59.6 ml 30.10 ml/m  AORTIC VALVE                    PULMONIC VALVE AV Area (Vmax):    2.07 cm     PR End Diast Vel: 5.20 msec AV Area (Vmean):   1.98 cm AV Area (VTI):     2.03 cm AV Vmax:           78.40 cm/s AV Vmean:          57.400 cm/s AV VTI:            0.136 m AV Peak Grad:      2.5 mmHg AV Mean Grad:      2.0  mmHg LVOT Vmax:         51.70 cm/s LVOT Vmean:        36.200 cm/s LVOT VTI:          0.088 m LVOT/AV VTI ratio: 0.65 AI PHT:            470 msec  AORTA Ao Root diam: 3.50 cm MITRAL VALVE               TRICUSPID VALVE MV Area (PHT): 3.24 cm    TR Peak grad:   35.8 mmHg MV Decel Time: 234 msec    TR Vmax:        299.00 cm/s MV E velocity: 68.20 cm/s                            SHUNTS                            Systemic VTI:  0.09 m                            Systemic Diam: 2.00 cm Kathlyn Sacramento MD Electronically signed by Kathlyn Sacramento MD Signature Date/Time: 02/09/2022/5:10:19 PM    Final       Time coordinating discharge: Over 30 minutes  SIGNED:  Emeterio Reeve DO [facility]

## 2022-02-11 NOTE — Progress Notes (Signed)
PROGRESS NOTE    Antonio Clark  IEP:[id number] DOB: Dec 08, 1935  DOA: 02/08/2022 Date of Service: 02/11/22 PCP: Susy Frizzle, MD     Brief Narrative / Hospital Course:  86 y.o. Caucasian male with medical history significant for GERD, hypertension, dyslipidemia, urolithiasis, OSA on CPAP, and atrial fibrillation on Pradaxa, who presented to the emergency room 02/08/2022 with acute onset of worsening dyspnea with associated dry cough occasionally productive of clear sputum with occasional blood tinges as well as wheezing for the last 5 to 6 days. 07/02-07/04: admitted and treated for acute CHF Repeat ejection fraction 25 to 30%, down significantly from 55 to 60% from prior echocardiogram.  Cardiology following.  Planning inpatient ischemic evaluation. 07/05: L/R Cardiac Cath planned for this afternoon     Consultants:  Cardiology  Procedures: [Planned L/R Cardiac Cath 02/11/22]    Subjective: Patient reports doing well, no CP/SOB at this time. Anxious to get the cath done so he can eat.      ASSESSMENT & PLAN:   Principal Problem:   Acute CHF (congestive heart failure) (HCC) Active Problems:   Hypertensive urgency   Paroxysmal atrial fibrillation with rapid ventricular response (HCC)   Hyperlipidemia   Coronary artery disease   Type 2 diabetes mellitus with complication, without long-term current use of insulin ([place])  New onset decompensated systolic congestive heart failure Bilateral pleural effusions Volume overload Hypokalemia Started on IV diuresis on admission  Cardiology [facility] has been consulted  Echocardiogram 25 to 30% down from prior 55 to 50%  V furosemide 40 mg twice daily Strict ins and outs, Daily weights Monitor and replace potassium and magnesium aggressively Low-dose losartan added per cardiology Outpatient plan to switch to [facility] as BP allows Plan for left and right heart catheterization 7/5 with possible PCI Home Pradaxa held and on  IV heparin in preparation for procedure   Elevated troponin Not consistent with ACS On heparin GTT as above Cardiac catheterization as above   Hypertensive urgency Possible underlying etiology of congestive heart failure Improved Continue Lopressor Low-dose losartan added 7/4 As needed IV labetalol Diuresis as above   Persistent atrial fibrillation with rapid ventricular response Rate control improved Continue Lopressor Heparin GTT as above Pradaxa on hold Telemetry monitoring   Type 2 diabetes mellitus without insulin use Continue home Tradjenta SSI x1 day   Coronary artery disease Hyperlipidemia PTA beta-blocker and statin      DVT prophylaxis: Pradaxa Code Status: FULL Family Communication: family at bedside on rounds  Disposition Plan / TOC needs: anticipate return to home envornment vs HH/SNF needs eval pending clinical improvement  Barriers to discharge / significant pending items: planning for cardiac cath today 02/11/22 await cardiology recs based on this              Objective: Vitals:   02/11/22 0355 02/11/22 0425 02/11/22 0737 02/11/22 0940  BP: 128/74  123/78   Pulse: 99  (!) 53   Resp: 20  16 (!) 23  Temp: 97.9 F (36.6 C)  97.9 F (36.6 C)   TempSrc: Oral  Oral   SpO2: 94%  98%   Weight:  78 kg    Height:        Intake/Output Summary (Last 24 hours) at 02/11/2022 0940 Last data filed at 02/11/2022 0729 Gross per 24 hour  Intake 304.14 ml  Output 1875 ml  Net -1570.86 ml   Filed Weights   02/08/22 1614 02/10/22 0421 02/11/22 0425  Weight: 82.1 kg 79.6 kg  78 kg    Examination:  Constitutional:  VS as above General Appearance: alert, well-developed, well-nourished, NAD Ears, Nose, Mouth, Throat: Normal appearance Neck: No masses, trachea midline Respiratory: Normal respiratory effort Breath sounds normal, no wheeze/rhonchi/rales Cardiovascular: S1/S2 normal, no murmur/rub/gallop auscultated No lower extremity  edema Gastrointestinal: Nontender, no masses Musculoskeletal:  No clubbing/cyanosis of digits Neurological: No cranial nerve deficit on limited exam Psychiatric: Normal judgment/insight Normal mood and affect       Scheduled Medications:   atorvastatin  10 mg Oral Daily   diclofenac Sodium  2 g Topical QID   finasteride  5 mg Oral Daily   furosemide  40 mg Intravenous Q12H   insulin aspart  0-9 Units Subcutaneous TID AC & HS   linagliptin  5 mg Oral Daily   loratadine  10 mg Oral Daily   losartan  12.5 mg Oral Daily   metoprolol tartrate  50 mg Oral BID   mupirocin ointment  1 Application Topical BID   sodium chloride flush  3 mL Intravenous Q12H    Continuous Infusions:  sodium chloride     sodium chloride 10 mL/hr at 02/11/22 0729   heparin 1,200 Units/hr (02/11/22 0729)    PRN Medications:  sodium chloride, acetaminophen **OR** acetaminophen, fluticasone, LORazepam, magnesium hydroxide, ondansetron **OR** ondansetron (ZOFRAN) IV, sodium chloride flush, traZODone  Antimicrobials:  Anti-infectives (From admission, onward)    None       Data Reviewed: I have personally reviewed following labs and imaging studies  CBC: Recent Labs  Lab 02/08/22 1617 02/09/22 0458 02/10/22 0748 02/11/22 0622  WBC 6.7 8.6 6.6 7.2  HGB 12.0* 13.1 12.2* 12.9*  HCT 38.6* 41.7 37.7* 39.5  MCV 91.0 90.5 87.9 87.8  PLT 149* 187 150 671   Basic Metabolic Panel: Recent Labs  Lab 02/08/22 1617 02/09/22 0458 02/10/22 0748 02/11/22 0622  NA 142 142 138 140  K 3.6 3.1* 3.4* 4.1  CL 109 109 105 106  CO2 '24 26 24 23  '$ GLUCOSE 173* 128* 121* 143*  BUN '18 19 21 '$ 29*  CREATININE 1.00 1.28* 1.15 1.22  CALCIUM 9.4 9.6 8.9 9.1  MG  --   --  1.6*  --    GFR: Estimated Creatinine Clearance: 45.1 mL/min (by C-G formula based on SCr of 1.22 mg/dL). Liver Function Tests: No results for input(s): "AST", "ALT", "ALKPHOS", "BILITOT", "PROT", "ALBUMIN" in the last 168 hours. No  results for input(s): "LIPASE", "AMYLASE" in the last 168 hours. No results for input(s): "AMMONIA" in the last 168 hours. Coagulation Profile: No results for input(s): "INR", "PROTIME" in the last 168 hours. Cardiac Enzymes: No results for input(s): "CKTOTAL", "CKMB", "CKMBINDEX", "TROPONINI" in the last 168 hours. BNP (last 3 results) No results for input(s): "PROBNP" in the last 8760 hours. HbA1C: Recent Labs    02/09/22 0458  HGBA1C 6.4*   CBG: Recent Labs  Lab 02/10/22 0725 02/10/22 1154 02/10/22 1626 02/10/22 2059 02/11/22 0738  GLUCAP 116* 144* 121* 176* 137*   Lipid Profile: No results for input(s): "CHOL", "HDL", "LDLCALC", "TRIG", "CHOLHDL", "LDLDIRECT" in the last 72 hours. Thyroid Function Tests: Recent Labs    02/09/22 0458  TSH 3.207   Anemia Panel: No results for input(s): "VITAMINB12", "FOLATE", "FERRITIN", "TIBC", "IRON", "RETICCTPCT" in the last 72 hours. Urine analysis:    Component Value Date/Time   COLORURINE YELLOW 10/30/2021 1203   APPEARANCEUR CLOUDY (A) 10/30/2021 1203   LABSPEC 1.020 10/30/2021 1203   PHURINE < OR = 5.0 10/30/2021 1203  GLUCOSEU NEGATIVE 10/30/2021 1203   HGBUR 3+ (A) 10/30/2021 1203   BILIRUBINUR NEGATIVE 01/21/2011 1130   KETONESUR NEGATIVE 10/30/2021 1203   PROTEINUR 2+ (A) 10/30/2021 1203   UROBILINOGEN 0.2 01/21/2011 1130   NITRITE POSITIVE (A) 10/30/2021 1203   LEUKOCYTESUR 2+ (A) 10/30/2021 1203   Sepsis Labs: '@LABRCNTIP'$ (procalcitonin:4,lacticidven:4)  Recent Results (from the past 240 hour(s))  SARS Coronavirus 2 by RT PCR (hospital order, performed in [place] hospital lab) *cepheid single result test* Anterior Nasal Swab     Status: None   Collection Time: 02/08/22  5:22 PM   Specimen: Anterior Nasal Swab  Result Value Ref Range Status   SARS Coronavirus 2 by RT PCR NEGATIVE NEGATIVE Final    Comment: (NOTE) SARS-CoV-2 target nucleic acids are NOT DETECTED.  The SARS-CoV-2 RNA is generally  detectable in upper and lower respiratory specimens during the acute phase of infection. The lowest concentration of SARS-CoV-2 viral copies this assay can detect is 250 copies / mL. A negative result does not preclude SARS-CoV-2 infection and should not be used as the sole basis for treatment or other patient management decisions.  A negative result may occur with improper specimen collection / handling, submission of specimen other than nasopharyngeal swab, presence of viral mutation(s) within the areas targeted by this assay, and inadequate number of viral copies (<250 copies / mL). A negative result must be combined with clinical observations, patient history, and epidemiological information.  Fact Sheet for Patients:   https://www.patel.info/  Fact Sheet for Healthcare Providers: https://hall.com/  This test is not yet approved or  cleared by the [place] FDA and has been authorized for detection and/or diagnosis of SARS-CoV-2 by FDA under an Emergency Use Authorization (EUA).  This EUA will remain in effect (meaning this test can be used) for the duration of the COVID-19 declaration under Section 564(b)(1) of the Act, 21 U.S.C. section 360bbb-3(b)(1), unless the authorization is terminated or revoked sooner.  Performed at [facility], [address] [place], [place] [address]          Radiology Studies last 96 hours: ECHOCARDIOGRAM COMPLETE  Result Date: 02/09/2022    ECHOCARDIOGRAM REPORT   Patient Name:   Antonio Clark Date of Exam: 02/09/2022 Medical Rec #:  [id number]     Height:       69.0 in Accession #:    [id number]    Weight:       181.0 lb Date of Birth:  Oct 12, 1935     BSA:          1.980 m Patient Age:    29 years      BP:           151/109 mmHg Patient Gender: M             HR:           95 bpm. Exam Location:  [facility] Procedure: 2D Echo, Cardiac Doppler and Color Doppler Indications:     I50.31 CHF acute  diastolic  History:         Patient has prior history of Echocardiogram examinations, most                  recent 04/10/2020. CHF, CAD, History of prostate cancer,                  Arrythmias:Atrial Fibrillation; Risk Factors:Diabetes,                  Dyslipidemia, Hypertension  and Sleep Apnea.  Sonographer:     Rosalia Hammers Referring Phys:  [id number] Rise Mu Diagnosing Phys: Kathlyn Sacramento MD IMPRESSIONS  1. Left ventricular ejection fraction, by estimation, is 25 to 30%. The left ventricle has severely decreased function. The left ventricle demonstrates global hypokinesis. The left ventricular internal cavity size was mildly dilated. There is moderate left ventricular hypertrophy. Left ventricular diastolic parameters are indeterminate.  2. Right ventricular systolic function is normal. The right ventricular size is normal. There is mildly elevated pulmonary artery systolic pressure.  3. Left atrial size was moderately dilated.  4. Right atrial size was moderately dilated.  5. The mitral valve is normal in structure. Mild to moderate mitral valve regurgitation. No evidence of mitral stenosis. Moderate mitral annular calcification.  6. Tricuspid valve regurgitation is moderate.  7. The aortic valve is normal in structure. Aortic valve regurgitation is mild. Aortic valve sclerosis/calcification is present, without any evidence of aortic stenosis.  8. Pulmonic valve regurgitation is moderate.  9. The inferior vena cava is normal in size with <50% respiratory variability, suggesting right atrial pressure of 8 mmHg. FINDINGS  Left Ventricle: Left ventricular ejection fraction, by estimation, is 25 to 30%. The left ventricle has severely decreased function. The left ventricle demonstrates global hypokinesis. The left ventricular internal cavity size was mildly dilated. There is moderate left ventricular hypertrophy. Left ventricular diastolic parameters are indeterminate. Right Ventricle: The right ventricular size is  normal. No increase in right ventricular wall thickness. Right ventricular systolic function is normal. There is mildly elevated pulmonary artery systolic pressure. The tricuspid regurgitant velocity is 2.99  m/s, and with an assumed right atrial pressure of 8 mmHg, the estimated right ventricular systolic pressure is 64.3 mmHg. Left Atrium: Left atrial size was moderately dilated. Right Atrium: Right atrial size was moderately dilated. Pericardium: There is no evidence of pericardial effusion. Mitral Valve: The mitral valve is normal in structure. Moderate mitral annular calcification. Mild to moderate mitral valve regurgitation. No evidence of mitral valve stenosis. Tricuspid Valve: The tricuspid valve is normal in structure. Tricuspid valve regurgitation is moderate . No evidence of tricuspid stenosis. Aortic Valve: The aortic valve is normal in structure. Aortic valve regurgitation is mild. Aortic regurgitation PHT measures 470 msec. Aortic valve sclerosis/calcification is present, without any evidence of aortic stenosis. Aortic valve mean gradient measures 2.0 mmHg. Aortic valve peak gradient measures 2.5 mmHg. Aortic valve area, by VTI measures 2.03 cm. Pulmonic Valve: The pulmonic valve was normal in structure. Pulmonic valve regurgitation is moderate. No evidence of pulmonic stenosis. Aorta: The aortic root is normal in size and structure. Venous: The inferior vena cava is normal in size with less than 50% respiratory variability, suggesting right atrial pressure of 8 mmHg. IAS/Shunts: No atrial level shunt detected by color flow Doppler.  LEFT VENTRICLE PLAX 2D LVIDd:         5.18 cm LVIDs:         3.97 cm LV PW:         1.41 cm LV IVS:        1.38 cm LVOT diam:     2.00 cm LV SV:         28 LV SV Index:   14 LVOT Area:     3.14 cm  RIGHT VENTRICLE RV Basal diam:  4.12 cm LEFT ATRIUM             Index        RIGHT ATRIUM  Index LA diam:        4.20 cm 2.12 cm/m   RA Area:     23.50 cm LA Vol  (A2C):   65.4 ml 33.03 ml/m  RA Volume:   76.20 ml  38.48 ml/m LA Vol (A4C):   52.0 ml 26.26 ml/m LA Biplane Vol: 59.6 ml 30.10 ml/m  AORTIC VALVE                    PULMONIC VALVE AV Area (Vmax):    2.07 cm     PR End Diast Vel: 5.20 msec AV Area (Vmean):   1.98 cm AV Area (VTI):     2.03 cm AV Vmax:           78.40 cm/s AV Vmean:          57.400 cm/s AV VTI:            0.136 m AV Peak Grad:      2.5 mmHg AV Mean Grad:      2.0 mmHg LVOT Vmax:         51.70 cm/s LVOT Vmean:        36.200 cm/s LVOT VTI:          0.088 m LVOT/AV VTI ratio: 0.65 AI PHT:            470 msec  AORTA Ao Root diam: 3.50 cm MITRAL VALVE               TRICUSPID VALVE MV Area (PHT): 3.24 cm    TR Peak grad:   35.8 mmHg MV Decel Time: 234 msec    TR Vmax:        299.00 cm/s MV E velocity: 68.20 cm/s                            SHUNTS                            Systemic VTI:  0.09 m                            Systemic Diam: 2.00 cm Kathlyn Sacramento MD Electronically signed by Kathlyn Sacramento MD Signature Date/Time: 02/09/2022/5:10:19 PM    Final    CT Angio Chest PE W and/or Wo Contrast  Result Date: 02/08/2022 CLINICAL DATA:  Pulmonary embolism (PE) suspected, high prob EXAM: CT ANGIOGRAPHY CHEST WITH CONTRAST TECHNIQUE: Multidetector CT imaging of the chest was performed using the standard protocol during bolus administration of intravenous contrast. Multiplanar CT image reconstructions and MIPs were obtained to evaluate the vascular anatomy. RADIATION DOSE REDUCTION: This exam was performed according to the departmental dose-optimization program which includes automated exposure control, adjustment of the mA and/or kV according to patient size and/or use of iterative reconstruction technique. CONTRAST:  68m OMNIPAQUE IOHEXOL 350 MG/ML SOLN COMPARISON:  None Available. FINDINGS: Cardiovascular: Satisfactory opacification of the pulmonary arteries to the segmental level. No evidence of pulmonary embolism. The main pulmonary artery  measures 3.1 cm. Prominent heart size. No significant pericardial effusion. The thoracic aorta is normal in caliber. No atherosclerotic plaque of the thoracic aorta. No coronary artery calcifications. Mediastinum/Nodes: No enlarged mediastinal, hilar, or axillary lymph nodes. Thyroid gland, trachea, and esophagus demonstrate no significant findings. Lungs/Pleura: Expiratory phase of respiration with mosaic attenuation of the lungs. No focal consolidation. No pulmonary nodule.  No pulmonary mass. Bilateral small to moderate, right greater than left, pleural effusions. No pneumothorax. Diffuse mild bronchial wall thickening. Upper Abdomen: No acute abnormality. Musculoskeletal: No chest wall abnormality. No suspicious lytic or blastic osseous lesions. No acute displaced fracture. Multilevel degenerative changes of the spine. Review of the MIP images confirms the above findings. IMPRESSION: 1. Bilateral small to moderate, right greater than left, pleural effusions. 2. Bronchial wall thickening suggestive of small airway disease. 3. Cardiomegaly. Electronically Signed   By: Iven Finn M.D.   On: 02/08/2022 18:44   DG Chest 2 View  Result Date: 02/08/2022 CLINICAL DATA:  sob EXAM: CHEST - 2 VIEW COMPARISON:  None Available. FINDINGS: The heart and mediastinal contours are unchanged. Prominent hilar vasculature. No focal consolidation. Increased interstitial markings. Bilateral trace pleural effusion. No pneumothorax. No acute osseous abnormality.  Right chest wall vascular clips. IMPRESSION: Pulmonary edema.  Bilateral trace pleural effusions. Electronically Signed   By: Iven Finn M.D.   On: 02/08/2022 17:00            LOS: 3 days      Emeterio Reeve, DO [facility] 02/11/2022, 9:40 AM   Staff may message me via secure chat in [place]  but this may not receive immediate response,  please page for urgent matters!  If 7PM-7AM, please contact night-coverage [email or web address]  Dictation  software was used to generate the above note. Typos may occur and escape review, as with typed/written notes. Please contact Dr Sheppard Coil directly for clarity if needed.

## 2022-02-11 NOTE — Progress Notes (Signed)
Progress Note  Patient Name: Antonio Clark Date of Encounter: 02/11/2022  [facility] Cardiologist: Candee Furbish, MD   Subjective   Dyspnea much improved following diuresis. No chest pain or palpitations. He remains in Afib with ventricular rates in the 90s to low 100s bpm. Labs and vitals stable. He is for [facility] this afternoon.   Inpatient Medications    Scheduled Meds:  atorvastatin  10 mg Oral Daily   diclofenac Sodium  2 g Topical QID   finasteride  5 mg Oral Daily   furosemide  40 mg Intravenous Q12H   insulin aspart  0-9 Units Subcutaneous TID AC & HS   linagliptin  5 mg Oral Daily   loratadine  10 mg Oral Daily   losartan  12.5 mg Oral Daily   metoprolol tartrate  50 mg Oral BID   mupirocin ointment  1 Application Topical BID   sodium chloride flush  3 mL Intravenous Q12H   Continuous Infusions:  sodium chloride     sodium chloride 10 mL/hr at 02/11/22 0729   heparin 1,200 Units/hr (02/11/22 0729)   PRN Meds: sodium chloride, acetaminophen **OR** acetaminophen, fluticasone, LORazepam, magnesium hydroxide, ondansetron **OR** ondansetron (ZOFRAN) IV, sodium chloride flush, traZODone   Vital Signs    Vitals:   02/10/22 2340 02/11/22 0355 02/11/22 0425 02/11/22 0737  BP: 118/71 128/74  123/78  Pulse:  99  (!) 53  Resp: '20 20  16  '$ Temp: 97.7 F (36.5 C) 97.9 F (36.6 C)  97.9 F (36.6 C)  TempSrc: Oral Oral  Oral  SpO2: 94% 94%  98%  Weight:   78 kg   Height:        Intake/Output Summary (Last 24 hours) at 02/11/2022 0850 Last data filed at 02/11/2022 0729 Gross per 24 hour  Intake 304.14 ml  Output 2025 ml  Net -1720.86 ml       02/11/2022    4:25 AM 02/10/2022    4:21 AM 02/08/2022    4:14 PM  Last 3 Weights  Weight (lbs) 172 lb 175 lb 7.8 oz 181 lb  Weight (kg) 78.019 kg 79.6 kg 82.101 kg      Telemetry    Atrial fibrillation with ventricular rates in the 90s to low 100s bpm with rare aberrancy - Personally Reviewed  ECG    No new tracings -  Personally Reviewed  Physical Exam   GEN: No acute distress.   Neck: No JVD Cardiac: Irregularly irregular, no murmurs, rubs, or gallops.  Respiratory: Clear to auscultation bilaterally. GI: Soft, nontender, non-distended  MS: No edema. No deformity.   Neuro:  Nonfocal  Psych: Normal affect   Labs    High Sensitivity Troponin:   Recent Labs  Lab 02/08/22 1617 02/08/22 1844  TROPONINIHS 34* 35*      Chemistry Recent Labs  Lab 02/09/22 0458 02/10/22 0748 02/11/22 0622  NA 142 138 140  K 3.1* 3.4* 4.1  CL 109 105 106  CO2 '26 24 23  '$ GLUCOSE 128* 121* 143*  BUN 19 21 29*  CREATININE 1.28* 1.15 1.22  CALCIUM 9.6 8.9 9.1  MG  --  1.6*  --   GFRNONAA 55* >60 58*  ANIONGAP '7 9 11     '$ Lipids No results for input(s): "CHOL", "TRIG", "HDL", "LABVLDL", "LDLCALC", "CHOLHDL" in the last 168 hours.  Hematology Recent Labs  Lab 02/09/22 0458 02/10/22 0748 02/11/22 0622  WBC 8.6 6.6 7.2  RBC 4.61 4.29 4.50  HGB 13.1 12.2* 12.9*  HCT 41.7 37.7* 39.5  MCV 90.5 87.9 87.8  MCH 28.4 28.4 28.7  MCHC 31.4 32.4 32.7  RDW 15.7* 15.6* 15.6*  PLT 187 150 153    Thyroid  Recent Labs  Lab 02/09/22 0458  TSH 3.207     BNP Recent Labs  Lab 02/08/22 1627  BNP 1,751.8*     DDimer No results for input(s): "DDIMER" in the last 168 hours.   Radiology     Cardiac Studies   2D echo 02/09/2022: 1. Left ventricular ejection fraction, by estimation, is 25 to 30%. The  left ventricle has severely decreased function. The left ventricle  demonstrates global hypokinesis. The left ventricular internal cavity size  was mildly dilated. There is moderate  left ventricular hypertrophy. Left ventricular diastolic parameters are  indeterminate.   2. Right ventricular systolic function is normal. The right ventricular  size is normal. There is mildly elevated pulmonary artery systolic  pressure.   3. Left atrial size was moderately dilated.   4. Right atrial size was moderately  dilated.   5. The mitral valve is normal in structure. Mild to moderate mitral valve  regurgitation. No evidence of mitral stenosis. Moderate mitral annular  calcification.   6. Tricuspid valve regurgitation is moderate.   7. The aortic valve is normal in structure. Aortic valve regurgitation is  mild. Aortic valve sclerosis/calcification is present, without any  evidence of aortic stenosis.   8. Pulmonic valve regurgitation is moderate.   9. The inferior vena cava is normal in size with <50% respiratory  variability, suggesting right atrial pressure of 8 mmHg.  Patient Profile     86 y.o. male  with a hx of persistent A-fib status post ablation in 04/2020 with recurrent A-fib status post DCCV in 05/2020 on Pradaxa, DM2, HTN, HLD, strong family history of CAD, melanoma, prostate cancer, and sleep apnea on CPAP who is being seen today for the evaluation of HFrEF and A-fib with RVR.  Assessment & Plan    1.  Acute on chronic systolic heart failure with significant drop in ejection fraction to 25 to 30% from prior of 50 to 55%.  Volume overload is improving with current dose of IV furosemide which will be continued.  He is scheduled for a R/LHC later today.  He may have clears until 10 AM (case ~ 14:30).  Continue metoprolol tartrate which should be switched to Toprol or Coreg before hospital discharge.  Continue Lopressor for now secondary to rate control of atrial fibrillation.  Continue low dose losartan with plans to switch to [facility] if his blood pressure allows.  Escalate GDMT moving forward with MRA and SGLT2i as able.    2.  Persistent atrial fibrillation: The patient had prior ablation and cardioversion and might be hard to get him back in sinus rhythm without an antiarrhythmic medication.  Continue rate control for now with metoprolol.  Continue anticoagulation with unfractionated heparin in anticipation of cardiac cath.  Pradaxa is currently on hold for that reason, resume [place] prior to  discharge.   3.  Elevated troponin: Possible supply demand ischemia but given drop in ejection fraction, the plan is to proceed with cardiac catheterization tomorrow as outlined.  Heparin gtt as above.   4.  HTN: Blood pressure well controlled.  Continue medical therapy as above.   5. HLD: LDL 38 in 10/2021.  PTA Lipitor.    Shared Decision Making/Informed Consent{  The risks [stroke (1 in 1000), death (1 in 39), kidney failure [usually temporary] (  1 in 500), bleeding (1 in 200), allergic reaction [possibly serious] (1 in 200)], benefits (diagnostic support and management of coronary artery disease) and alternatives of a cardiac catheterization were discussed in detail with Mr. Whitner and he is willing to proceed.   For questions or updates, please contact [place] Please consult [email or web address] for contact info under        Signed, Christell Faith, PA-C  02/11/2022, 8:50 AM

## 2022-02-11 NOTE — H&P (Signed)
Cardiology Admission History and Physical:   Patient ID: Antonio Clark MRN: [id number]; DOB: 1935/09/12   Admission date: (Not on file)  PCP:  Susy Frizzle, MD   [facility] Providers Cardiologist:  Candee Furbish, MD  Electrophysiologist:  Vickie Epley, MD  {   Chief Complaint: Progressive dyspnea   Patient Profile:   Antonio Clark is a 86 y.o. male with persistent A-fib status post ablation in 04/2020 with recurrent A-fib status post DCCV in 05/2020 on Pradaxa, DM2, HTN, HLD, strong family history of CAD, melanoma, prostate cancer, and sleep apnea on CPAP who was admitted to [facility] on 02/08/2022 with  acute HFrEF and A-fib with RVR.  He underwent R/LHC on the afternoon of 02/11/2022 which showed severe ostial left main stenosis with recommendation to transfer to [facility] for revascularization of the ostial/proximal left main.   History of Present Illness:   Mr. Holan underwent remote Cardiolite in 2013 was normal.  He was found to have A-fib in 2019, initially noted in the urology office.  Echo in 10/2017 demonstrated an EF of 50 to 55%, no regional wall motion normalities, grade 2 diastolic dysfunction, mildly calcified aortic valve leaflets with mild insufficiency, mildly dilated aortic root, trivial mitral regurgitation, moderately dilated left atrium, mildly dilated RV.  His recent echo from 04/2020 demonstrated an EF of 50 to 55%, indeterminate LV diastolic function parameters, normal RV systolic function and ventricular cavity size, normal PASP, mildly dilated left atrium, trivial mitral regurgitation, trivial aortic insufficiency, mild aortic valve sclerosis without evidence of stenosis, and a mildly dilated ascending aorta.  He was subsequently evaluated by EP and underwent A-fib ablation in 04/2020 with recurrent A-fib status post DCCV in 05/2020 requiring 3 shocks.    He was most recently seen in the office on 12/16/2021 and indicates he really did not feel any different following his  DCCV.  He was noted to be back in A-fib with controlled ventricular response (was in sinus at his A-fib clinic visit in 05/2021).  Given lack of symptoms, and age, rate control strategy was recommended.   He presented to [facility] ED on 02/08/2022 with a 2-week history of shortness of breath and dry cough.  No chest pain, palpitations, dizziness, presyncope, or syncope.  He notes chronic stable lower extremity swelling with the right lower extremity worse than the left at baseline.  No significant weight gain.  No fevers or chills.  He has never been able to feel his A-fib.  He reported adherence and tolerance to all medications, including anticoagulation and denies missing any doses.  He has had 3-4 falls recently which all started after he was stuck upside down for approximately 1 hour on his inversion table.  Upon his arrival to [facility] he was noted to be tachycardic with ventricular rates in the low 100s, hypertensive with blood pressure in the 937J to 696V systolic, and had oxygen saturations in the mid to upper 90s percent on room air.  He was afebrile.  Labs notable for a BNP of 1751, high-sensitivity troponin 34 with a delta troponin of 35, potassium 3.1, BUN 19, serum creatinine 1.28.  Chest x-ray showed pulmonary edema with bilateral trace pleural effusions.  CTA chest was negative for PE with bilateral small to moderate right greater than left pleural effusions, bronchial wall thickening suggestive of small airway disease, and cardiomegaly.  EKG showed A-fib with RVR with aberrancy, 113 bpm, baseline wandering, and nonspecific ST-T changes.  He was IV diuresed with symptomatic improvement  in his dyspnea.  Echo on 02/09/2022 demonstrated an EF of 25 to 30%, global hypokinesis, mildly dilated LV internal cavity size, moderate LVH, normal RV systolic function and ventricular cavity size, mildly elevated PASP, moderate biatrial enlargement, mild to moderate mitral valve regurgitation, moderate mitral annular  calcification, moderate tricuspid regurgitation, aortic valve sclerosis without evidence of stenosis, and an estimated right atrial pressure of 8 mmHg.  He underwent R/LHC on the afternoon of 02/11/2022 which demonstrated severe ostial/proximal left main disease with focal, eccentric 70% stenosis as well as 20% proximal/mid LAD stenosis and minimal luminal irregularities within the RCA.  Mildly elevated left heart filling pressures, mild pulmonary hypertension, and mildly reduced cardiac output/index.  Images were reviewed with interventional cardiology colleagues with recommendation to transfer the patient to [facility] for revascularization of the ostial/proximal left main.  The lesion appeared to be well suited to stenting with favor for IVUS guided PCI over CABG in the setting of low syntax score, advanced age, and other comorbidities.   Past Medical History:  Diagnosis Date   Anal fissure    Arthritis    Atrial fibrillation ([place])    Back pain    Colon polyps    Diabetes mellitus type 2 with complications ([place])    Dysrhythmia    a-fib   GERD (gastroesophageal reflux disease)    History of echocardiogram 07/ 07/ 2011   History of lithotripsy 1989   Hyperlipidemia    Hypertension    Kidney stones    Melanoma ([place]) 05/01/2019   right chest wall (12/20)   Prostate CA ([place]) 2010   Sleep apnea    uses CPAP nightly   Squamous cell carcinoma of skin 05/23/1992   bowens-left parietal scalp (CX35FU)   Squamous cell carcinoma of skin 03/14/2008   in situ-left upper outer forehead-medial (CX35FU)   Squamous cell carcinoma of skin 03/14/2008   in situ-crown of scalp ([id number])   Squamous cell carcinoma of skin 04/15/2011   in situ-right dorsal forearm (txpbx)   Squamous cell carcinoma of skin 10/16/2011   in situ-left sideburn   Squamous cell carcinoma of skin 03/11/2015   ka-left sideburn ([id number])   Squamous cell carcinoma of skin 03/11/2015   ka-left forearm ([id number])   Squamous  cell carcinoma of skin 06/22/2018   in situ-left forearm, sup (txpbx)   Squamous cell carcinoma of skin 05/01/2019   in situ-mid anterior scalp    Squamous cell carcinoma of skin 05/01/2019   in situ-right upper arm    Past Surgical History:  Procedure Laterality Date   ATRIAL FIBRILLATION ABLATION N/A 04/17/2020   Procedure: [place];  Surgeon: Vickie Epley, MD;  Location: [place] [facility];  Service: Cardiovascular;  Laterality: N/A;   AXILLARY SENTINEL NODE BIOPSY Right 08/23/2019   Procedure: SENTINEL LYMPH NODE BIOSPY RIGHT AXILLA;  Surgeon: Stark Klein, MD;  Location: [place];  Service: General;  Laterality: Right;   CARDIOVERSION N/A 05/10/2020   Procedure: CARDIOVERSION;  Surgeon: Freada Bergeron, MD;  Location: [facility] ENDOSCOPY;  Service: Cardiovascular;  Laterality: N/A;   CARDIOVERSION N/A 05/08/2021   Procedure: CARDIOVERSION;  Surgeon: Donato Heinz, MD;  Location: [facility] ENDOSCOPY;  Service: Cardiovascular;  Laterality: N/A;   COLONOSCOPY  2002   FASCIECTOMY Left 03/28/2019   Procedure: SEGMENTAL FASCIECTOMY LEFT RING FINGER;  Surgeon: Daryll Brod, MD;  Location: [place];  Service: Orthopedics;  Laterality: Left;  ANESTHESIA  AXILLARY BLOCK   INSERTION PROSTATE RADIATION SEED  8 4 2010   per Dr Rosana Hoes   MELANOMA EXCISION Right 06/22/2019   Procedure: WIDE LOCAL EXCISION RIGHT CHEST WALL MELANOMA, ADVANCEMENT FLAP CLOSURE FOR DEFECT 3X6 CM;  Surgeon: Stark Klein, MD;  Location: [place];  Service: General;  Laterality: Right;   POLYPECTOMY  2002   THROAT SURGERY  1980   benign cyst   UPPER GASTROINTESTINAL ENDOSCOPY     URETHRAL STRICTURE DILATATION     also penile implant     Medications Prior to Admission: Prior to Admission medications   Medication Sig Start Date End Date Taking? Authorizing Provider  atorvastatin (LIPITOR) 10 MG tablet Take 10 mg by mouth daily.    [provider]  budesonide-formoterol (SYMBICORT) 160-4.5 MCG/ACT inhaler Inhale 2 puffs into the lungs 2 (two) times daily. 01/20/22   Susy Frizzle, MD  cetirizine (ZYRTEC) 10 MG tablet TAKE 1 TABLET BY MOUTH EVERY DAY. NOT COVERED BY INSURANCE. 12/16/21   Susy Frizzle, MD  diclofenac Sodium (VOLTAREN) 1 % GEL Apply 2 g topically 4 (four) times daily. 12/29/21   Susy Frizzle, MD  finasteride (PROSCAR) 5 MG tablet Take 5 mg by mouth daily. 08/22/21   [provider]  fluticasone (FLONASE) 50 MCG/ACT nasal spray Place 1 spray into both nostrils daily as needed for allergies or rhinitis.    [provider]  linagliptin (TRADJENTA) 5 MG TABS tablet Take 1 tablet (5 mg total) by mouth daily. 10/06/21   Susy Frizzle, MD  LORazepam (ATIVAN) 0.5 MG tablet Take 1 tablet (0.5 mg total) by mouth 2 (two) times daily as needed for anxiety. 01/20/22   Susy Frizzle, MD  metoprolol tartrate (LOPRESSOR) 25 MG tablet Take 1 tablet (25 mg total) by mouth 2 (two) times daily. 05/16/21   Jerline Pain, MD  mupirocin ointment (BACTROBAN) 2 % Apply 1 Application topically 2 (two) times daily. 02/03/22   Lavonna Monarch, MD  PRADAXA 150 MG CAPS capsule TAKE 1 CAPSULE BY MOUTH TWICE A DAY 12/15/21   Jerline Pain, MD     Allergies:   No Known Allergies  Social History:   Social History   Socioeconomic History   Marital status: Single    Spouse name: Not on file   Number of children: 0   Years of education: Not on file   Highest education level: Not on file  Occupational History   Occupation: retired  Tobacco Use   Smoking status: Never   Smokeless tobacco: Never  Vaping Use   Vaping Use: Never used  Substance and Sexual Activity   Alcohol use: Not Currently    Comment: social   Drug use: No   Sexual activity: Yes  Other Topics Concern   Not on file  Social History Narrative   Not on file   Social Determinants of Health   Financial Resource Strain: Low Risk   (10/30/2021)   Overall Financial Resource Strain (CARDIA)    Difficulty of Paying Living Expenses: Not hard at all  Food Insecurity: No Food Insecurity (10/30/2021)   Hunger Vital Sign    Worried About Running Out of Food in the Last Year: Never true    [place] in the Last Year: Never true  Transportation Needs: No Transportation Needs (10/30/2021)   PRAPARE - Hydrologist (Medical): No    Lack of Transportation (Non-Medical): No  Physical Activity: Inactive (10/30/2021)   Exercise Vital Sign  Days of Exercise per Week: 0 days    Minutes of Exercise per Session: 0 min  Stress: No Stress Concern Present (10/30/2021)   [place]    Feeling of Stress : Not at all  Social Connections: Moderately Integrated (10/30/2021)   Social Connection and Isolation Panel [NHANES]    Frequency of Communication with Friends and Family: More than three times a week    Frequency of Social Gatherings with Friends and Family: More than three times a week    Attends Religious Services: More than 4 times per year    Active Member of [employer] or Organizations: Yes    Attends Archivist Meetings: More than 4 times per year    Marital Status: Never married  Intimate Partner Violence: Not At Risk (10/30/2021)   Humiliation, Afraid, Rape, and Kick questionnaire    Fear of Current or Ex-Partner: No    Emotionally Abused: No    Physically Abused: No    Sexually Abused: No    Family History:   The patient's family history includes Arrhythmia in his brother; Colon cancer (age of onset: 15) in his mother; Coronary artery disease in his brother; Depression in his mother; Diabetes in his brother and brother; Hearing loss in his brother; Heart disease in his brother and father; Heart failure in his brother; Hyperlipidemia in his sister; Hypertension in his brother, brother, brother, and father; Stroke in his mother  and sister; Sudden death in his father; Suicidality in his sister. There is no history of Esophageal cancer, Pancreatic cancer, Prostate cancer, Rectal cancer, or Stomach cancer.    ROS:  Please see the history of present illness.  All other ROS reviewed and negative.     Physical Exam/Data:   Vitals:    02/10/22 2340 02/11/22 0355 02/11/22 0425 02/11/22 0737  BP: 118/71 128/74   123/78  Pulse:   99   (!) 53  Resp: '20 20   16  '$ Temp: 97.7 F (36.5 C) 97.9 F (36.6 C)   97.9 F (36.6 C)  TempSrc: Oral Oral   Oral  SpO2: 94% 94%   98%  Weight:     78 kg    Height:              Intake/Output Summary (Last 24 hours) at 02/11/2022 0850 Last data filed at 02/11/2022 0729    Gross per 24 hour  Intake 304.14 ml  Output 2025 ml  Net -1720.86 ml          02/11/2022    4:25 AM 02/10/2022    4:21 AM 02/08/2022    4:14 PM  Last 3 Weights  Weight (lbs) 172 lb 175 lb 7.8 oz 181 lb  Weight (kg) 78.019 kg 79.6 kg 82.101 kg       General:  Well nourished, well developed, in no acute distress HEENT: Normal Neck: No JVD Vascular: No carotid bruits; Distal pulses 2+ bilaterally   Cardiac:  Normal S1, S2; IRIR; II/VI systolic murmur at the base Lungs:  Clear to auscultation bilaterally, no wheezing, rhonchi or rales  Abd: Soft, nontender, no hepatomegaly  Ext: No edema Musculoskeletal:  No deformities, BUE and BLE strength normal and equal Skin: Warm and dry  Neuro:  CNs 2-12 intact, no focal abnormalities noted Psych:  Normal affect    EKG:  The ECG that was done 02/08/2022 was personally reviewed and demonstrates A-fib with RVR with aberrancy, 130 bpm, and nonspecific ST-T  changes  Relevant CV Studies:  [facility] 02/11/2022: Conclusions: Severe ostial/proximal LMCA disease with focal, eccentric 70% stenosis.  There is also 20% proximal/mid LAD stenosis as well as minimal luminal irregularities in the RCA. Mildly elevated left heart filling pressures (PCWP/LVEDP 20 mmHg). Mild pulmonary  hypertension (mean PAP 31 mmHg). Mildly reduced Fick cardiac output/index (CO 4.7 L/min, CI 2.4 L/min/m^2). Relatively small left radial artery by ultrasound with significant vasospasm.  Recommend alternative access for PCI.   Recommendations: Images reviewed with Dr. [place]; we will plan to transfer Mr. Vangilder to [facility] for revascularization of ostial/proximal LMCA stenosis.  The lesion appears well-suited to stenting; I would favor IVUS-guided PCI over CABG in the setting of low Syntax score, advanced age, and other comorbidities. Continue diuresis and optimization of GDMT for acute HFrEF. Aggressive secondary prevention of coronary artery disease. __________  2D echo 02/09/2022: 1. Left ventricular ejection fraction, by estimation, is 25 to 30%. The  left ventricle has severely decreased function. The left ventricle  demonstrates global hypokinesis. The left ventricular internal cavity size  was mildly dilated. There is moderate  left ventricular hypertrophy. Left ventricular diastolic parameters are  indeterminate.   2. Right ventricular systolic function is normal. The right ventricular  size is normal. There is mildly elevated pulmonary artery systolic  pressure.   3. Left atrial size was moderately dilated.   4. Right atrial size was moderately dilated.   5. The mitral valve is normal in structure. Mild to moderate mitral valve  regurgitation. No evidence of mitral stenosis. Moderate mitral annular  calcification.   6. Tricuspid valve regurgitation is moderate.   7. The aortic valve is normal in structure. Aortic valve regurgitation is  mild. Aortic valve sclerosis/calcification is present, without any  evidence of aortic stenosis.   8. Pulmonic valve regurgitation is moderate.   9. The inferior vena cava is normal in size with <50% respiratory  variability, suggesting right atrial pressure of 8 mmHg. __________  2D echo 04/10/2020: 1. Inferior basal hypokinesis. Left  ventricular ejection fraction, by  estimation, is 50 to 55%. The left ventricle has low normal function. The  left ventricle demonstrates regional wall motion abnormalities (see  scoring diagram/findings for  description). Left ventricular diastolic parameters are indeterminate.   2. Right ventricular systolic function is normal. The right ventricular  size is normal. There is normal pulmonary artery systolic pressure.   3. Left atrial size was mildly dilated.   4. The mitral valve is normal in structure. Trivial mitral valve  regurgitation. No evidence of mitral stenosis.   5. The aortic valve is tricuspid. Aortic valve regurgitation is trivial.  Mild aortic valve sclerosis is present, with no evidence of aortic valve  stenosis.   6. Aortic dilatation noted. There is mild dilatation of the ascending  aorta measuring 39 mm.   7. The inferior vena cava is normal in size with greater than 50%  respiratory variability, suggesting right atrial pressure of 3 mmHg. __________   2D echo 11/05/2017: - Left ventricle: The cavity size was normal. There was mild focal    basal hypertrophy of the septum. Systolic function was normal.    The estimated ejection fraction was in the range of 50% to 55%.    Wall motion was normal; there were no regional wall motion    abnormalities. Features are consistent with a pseudonormal left    ventricular filling pattern, with concomitant abnormal relaxation    and increased filling pressure (grade 2 diastolic dysfunction).  -  Aortic valve: Trileaflet; moderately thickened, mildly calcified    leaflets. There was mild regurgitation. Regurgitation pressure    half-time: 701 ms.  - Aorta: Aortic root dimension: 38 mm (ED).  - Aortic root: The aortic root was mildly dilated.  - Mitral valve: Calcified annulus. There was trivial regurgitation.  - Left atrium: The atrium was moderately dilated.  - Right ventricle: The cavity size was mildly dilated. Wall     thickness was normal.  - Atrial septum: There was increased thickness of the septum,    consistent with lipomatous hypertrophy.  - Tricuspid valve: There was trivial regurgitation.  - Pulmonic valve: There was trivial regurgitation.  Laboratory Data:  High Sensitivity Troponin:   Recent Labs  Lab 02/08/22 1617 02/08/22 1844  TROPONINIHS 34* 35*      Chemistry Recent Labs  Lab 02/10/22 0748 02/11/22 0622  NA 138 140  K 3.4* 4.1  CL 105 106  CO2 24 23  GLUCOSE 121* 143*  BUN 21 29*  CREATININE 1.15 1.22  CALCIUM 8.9 9.1  MG 1.6*  --   GFRNONAA >60 58*  ANIONGAP 9 11    No results for input(s): "PROT", "ALBUMIN", "AST", "ALT", "ALKPHOS", "BILITOT" in the last 168 hours. Lipids No results for input(s): "CHOL", "TRIG", "HDL", "LABVLDL", "LDLCALC", "CHOLHDL" in the last 168 hours. Hematology Recent Labs  Lab 02/10/22 0748 02/11/22 0622  WBC 6.6 7.2  RBC 4.29 4.50  HGB 12.2* 12.9*  HCT 37.7* 39.5  MCV 87.9 87.8  MCH 28.4 28.7  MCHC 32.4 32.7  RDW 15.6* 15.6*  PLT 150 153   Thyroid  Recent Labs  Lab 02/09/22 0458  TSH 3.207   BNP Recent Labs  Lab 02/08/22 1627  BNP 1,751.8*    DDimer No results for input(s): "DDIMER" in the last 168 hours.   Radiology/Studies:   CXR 02/08/2022: IMPRESSION: Pulmonary edema.  Bilateral trace pleural effusions. __________  CTA chest 02/08/2022: IMPRESSION: 1. Bilateral small to moderate, right greater than left, pleural effusions. 2. Bronchial wall thickening suggestive of small airway disease. 3. Cardiomegaly.  Assessment and Plan:   CAD involving the native coronary arteries with unstable angina and mildly elevated high-sensitivity troponin: -Cath 02/11/2022 showed severe ostial/proximal left main stenosis as outlined above with recommendation to transfer to [name] for revascularization -Resume heparin drip 2 hours post TR band removal -Defer loading and initiation of clopidogrel to team at [facility] pending plans  for revascularization (surgical versus percutaneous) -ASA 81 mg -Transition Lopressor to Toprol-XL given cardiomyopathy and A-fib -PTA atorvastatin -Post-cath instructions -Cardiac rehab  Acute HFrEF: -Likely secondary to ischemic cardiomyopathy with significant left main stenosis as outlined in the cath report, though cannot exclude some degree of nonischemic cardiomyopathy with persistent A-fib with RVR -Escalate GDMT with transition from Lopressor to Toprol-XL -Continue losartan with recommendation to transition to [facility] prior to discharge as blood pressure allows -Continue escalation of evidence-based medical therapy with addition of MRA and SGLT2 inhibitor as vitals and labs allow -IV Lasix 40 mg twice daily -Plan for follow-up echo in several months time following revascularization and optimization of medical therapy, if his EF remains less than 35% at that time would recommend EP evaluation for consideration of ICD for primary prevention  Persistent A-fib: -Status post prior ablation and DCCV with recurrent A-fib -He did not feel any different while in documented sinus rhythm than he has while in A-fib -May be difficult to get him back in sinus rhythm without antiarrhythmic -Continue metoprolol as outlined above  for now -Anticoagulation with heparin with recommendation to resume [place] prior to discharge -Follow-up with EP for consideration of antiarrhythmic therapy versus repeat ablation  HTN: -Blood pressure currently well controlled -Continue medical therapy as outlined above  HLD:  -LDL 38 in 10/2021 -PTA atorvastatin  6.  DM2: -A1c 6.4 -SSI -PTA Tradjenta   Risk Assessment/Risk Scores:   TIMI Risk Score for Unstable Angina or Non-ST Elevation MI:   The patient's TIMI risk score is 5, which indicates a 26% risk of all cause mortality, new or recurrent myocardial infarction or need for urgent revascularization in the next 14 days.{   New York Heart Association  (NYHA) Functional Class NYHA Class III  CHA2DS2-VASc Score = 6  This indicates a 9.7% annual risk of stroke. The patient's score is based upon: CHF History: 1 HTN History: 1 Diabetes History: 1 Stroke History: 0 Vascular Disease History: 1 Age Score: 2 Gender Score: 0     Severity of Illness: The appropriate patient status for this patient is INPATIENT. Inpatient status is judged to be reasonable and necessary in order to provide the required intensity of service to ensure the patient's safety. The patient's presenting symptoms, physical exam findings, and initial radiographic and laboratory data in the context of their chronic comorbidities is felt to place them at high risk for further clinical deterioration. Furthermore, it is not anticipated that the patient will be medically stable for discharge from the hospital within 2 midnights of admission.   * I certify that at the point of admission it is my clinical judgment that the patient will require inpatient hospital care spanning beyond 2 midnights from the point of admission due to high intensity of service, high risk for further deterioration and high frequency of surveillance required.*   For questions or updates, please contact [place] Please consult [email or web address] for contact info under     Signed, Christell Faith, PA-C  02/11/2022 5:42 PM

## 2022-02-12 ENCOUNTER — Encounter: Payer: Self-pay | Admitting: Internal Medicine

## 2022-02-12 ENCOUNTER — Telehealth (HOSPITAL_COMMUNITY): Payer: Self-pay | Admitting: Pharmacy Technician

## 2022-02-12 ENCOUNTER — Other Ambulatory Visit (HOSPITAL_COMMUNITY): Payer: Self-pay

## 2022-02-12 DIAGNOSIS — I200 Unstable angina: Secondary | ICD-10-CM

## 2022-02-12 DIAGNOSIS — I255 Ischemic cardiomyopathy: Secondary | ICD-10-CM

## 2022-02-12 DIAGNOSIS — I4819 Other persistent atrial fibrillation: Secondary | ICD-10-CM

## 2022-02-12 LAB — CBC
HCT: 41.2 % (ref 39.0–52.0)
Hemoglobin: 13.3 g/dL (ref 13.0–17.0)
MCH: 29.2 pg (ref 26.0–34.0)
MCHC: 32.3 g/dL (ref 30.0–36.0)
MCV: 90.4 fL (ref 80.0–100.0)
Platelets: 156 10*3/uL (ref 150–400)
RBC: 4.56 MIL/uL (ref 4.22–5.81)
RDW: 15.5 % (ref 11.5–15.5)
WBC: 6.3 10*3/uL (ref 4.0–10.5)
nRBC: 0 % (ref 0.0–0.2)

## 2022-02-12 LAB — BASIC METABOLIC PANEL
Anion gap: 11 (ref 5–15)
BUN: 30 mg/dL — ABNORMAL HIGH (ref 8–23)
CO2: 27 mmol/L (ref 22–32)
Calcium: 9 mg/dL (ref 8.9–10.3)
Chloride: 103 mmol/L (ref 98–111)
Creatinine, Ser: 1.51 mg/dL — ABNORMAL HIGH (ref 0.61–1.24)
GFR, Estimated: 45 mL/min — ABNORMAL LOW (ref >60)
Glucose, Bld: 133 mg/dL — ABNORMAL HIGH (ref 70–99)
Potassium: 3.9 mmol/L (ref 3.5–5.1)
Sodium: 141 mmol/L (ref 135–145)

## 2022-02-12 LAB — GLUCOSE, CAPILLARY
Glucose-Capillary: 99 mg/dL (ref 70–99)
Glucose-Capillary: 130 mg/dL — ABNORMAL HIGH (ref 70–99)
Glucose-Capillary: 132 mg/dL — ABNORMAL HIGH (ref 70–99)
Glucose-Capillary: 172 mg/dL — ABNORMAL HIGH (ref 70–99)
Glucose-Capillary: 129 mg/dL — ABNORMAL HIGH (ref 70–99)

## 2022-02-12 LAB — HEPARIN LEVEL (UNFRACTIONATED): Heparin Unfractionated: 0.43 IU/mL (ref 0.30–0.70)

## 2022-02-12 LAB — APTT: aPTT: 106 seconds — ABNORMAL HIGH (ref 24–36)

## 2022-02-12 MED ORDER — SODIUM CHLORIDE 0.9 % WEIGHT BASED INFUSION
1.0000 mL/kg/h | INTRAVENOUS | Status: DC
  Administered 2022-02-13: 1 mL/kg/h via INTRAVENOUS

## 2022-02-12 MED ORDER — SODIUM CHLORIDE 0.9 % IV SOLN: 250.0000 mL | INTRAVENOUS | Status: DC | PRN

## 2022-02-12 MED ORDER — SODIUM CHLORIDE 0.9% FLUSH
3.0000 mL | Freq: Two times a day (BID) | INTRAVENOUS | Status: DC
  Administered 2022-02-12 – 2022-02-24 (×14): 3 mL via INTRAVENOUS

## 2022-02-12 MED ORDER — INSULIN ASPART 100 UNIT/ML IJ SOLN
0.0000 [IU] | Freq: Three times a day (TID) | INTRAMUSCULAR | Status: DC
  Administered 2022-02-12: 2 [IU] via SUBCUTANEOUS
  Administered 2022-02-12: 1 [IU] via SUBCUTANEOUS
  Administered 2022-02-14 (×3): 2 [IU] via SUBCUTANEOUS
  Administered 2022-02-15: 1 [IU] via SUBCUTANEOUS
  Administered 2022-02-15: 2 [IU] via SUBCUTANEOUS
  Administered 2022-02-15: 3 [IU] via SUBCUTANEOUS
  Administered 2022-02-16 (×2): 2 [IU] via SUBCUTANEOUS
  Administered 2022-02-16 – 2022-02-17 (×2): 1 [IU] via SUBCUTANEOUS
  Administered 2022-02-18 (×2): 2 [IU] via SUBCUTANEOUS
  Administered 2022-02-18 – 2022-02-19 (×3): 1 [IU] via SUBCUTANEOUS
  Administered 2022-02-19 – 2022-02-22 (×4): 2 [IU] via SUBCUTANEOUS
  Administered 2022-02-22: 1 [IU] via SUBCUTANEOUS
  Administered 2022-02-23: 2 [IU] via SUBCUTANEOUS

## 2022-02-12 MED ORDER — CLOPIDOGREL BISULFATE 75 MG PO TABS
75.0000 mg | ORAL_TABLET | Freq: Every day | ORAL | Status: DC
  Administered 2022-02-13: 75 mg via ORAL
  Filled 2022-02-12: qty 1

## 2022-02-12 MED ORDER — INSULIN ASPART 100 UNIT/ML IJ SOLN: 0.0000 [IU] | Freq: Every day | INTRAMUSCULAR | Status: DC

## 2022-02-12 MED ORDER — SODIUM CHLORIDE 0.9 % WEIGHT BASED INFUSION
3.0000 mL/kg/h | INTRAVENOUS | Status: DC
  Administered 2022-02-13: 3 mL/kg/h via INTRAVENOUS

## 2022-02-12 MED ORDER — CLOPIDOGREL BISULFATE 75 MG PO TABS
300.0000 mg | ORAL_TABLET | Freq: Once | ORAL | Status: AC
  Administered 2022-02-12: 300 mg via ORAL
  Filled 2022-02-12: qty 4

## 2022-02-12 MED ORDER — SODIUM CHLORIDE 0.9% FLUSH: 3.0000 mL | INTRAVENOUS | Status: DC | PRN

## 2022-02-12 MED ORDER — ATORVASTATIN CALCIUM 80 MG PO TABS
80.0000 mg | ORAL_TABLET | Freq: Every day | ORAL | Status: DC
  Administered 2022-02-12 – 2022-02-24 (×13): 80 mg via ORAL
  Filled 2022-02-12 (×13): qty 1

## 2022-02-12 MED ORDER — LOSARTAN POTASSIUM 25 MG PO TABS
12.5000 mg | ORAL_TABLET | Freq: Every day | ORAL | Status: DC
  Filled 2022-02-12: qty 1

## 2022-02-12 NOTE — Progress Notes (Addendum)
Progress Note  Patient Name: Antonio Clark Date of Encounter: 02/12/2022  [facility] Cardiologist: Candee Furbish, MD   Subjective   No chest pain this morning. Breathing is stable.   Inpatient Medications    Scheduled Meds:  aspirin EC  81 mg Oral Daily   atorvastatin  10 mg Oral Daily   diclofenac Sodium  4 g Topical QID   finasteride  5 mg Oral Daily   furosemide  40 mg Intravenous BID   insulin aspart  0-9 Units Subcutaneous TID WC   insulin aspart  0-9 Units Subcutaneous QHS   linagliptin  5 mg Oral Daily   loratadine  10 mg Oral Daily   losartan  12.5 mg Oral Daily   metoprolol succinate  150 mg Oral Daily   mupirocin ointment   Nasal BID   Continuous Infusions:  heparin 1,200 Units/hr (02/12/22 0514)   PRN Meds: acetaminophen, fluticasone, nitroGLYCERIN, ondansetron (ZOFRAN) IV, traZODone   Vital Signs    Vitals:   02/11/22 2132 02/12/22 0040 02/12/22 0455  BP: 130/77 (!) 102/57 107/81  Pulse: 91  (!) 106  Resp: '16 15 19  '$ Temp: (!) 97.4 F (36.3 C) (!) 97.5 F (36.4 C) 97.8 F (36.6 C)  TempSrc: Oral Oral Oral  SpO2: 97% 98% 97%  Weight:   74.1 kg    Intake/Output Summary (Last 24 hours) at 02/12/2022 0750 Last data filed at 02/12/2022 0514 Gross per 24 hour  Intake 306.37 ml  Output --  Net 306.37 ml      02/12/2022    4:55 AM 02/11/2022    4:25 AM 02/10/2022    4:21 AM  Last 3 Weights  Weight (lbs) 163 lb 6.4 oz 172 lb 175 lb 7.8 oz  Weight (kg) 74.118 kg 78.019 kg 79.6 kg      Telemetry    Atrial fib rates 100s - Personally Reviewed  ECG    No new tracing this morning  Physical Exam   GEN: No acute distress.   Neck: No JVD Cardiac: Irreg Irreg, no murmurs, rubs, or gallops.  Respiratory: Clear to auscultation bilaterally. GI: Soft, nontender, non-distended  MS: No edema; No deformity. Left radial cath site stable.  Neuro:  Nonfocal  Psych: Normal affect   Labs    High Sensitivity Troponin:   Recent Labs  Lab 02/08/22 1617  02/08/22 1844  TROPONINIHS 34* 35*     Chemistry Recent Labs  Lab 02/10/22 0748 02/11/22 0622 02/12/22 0630  NA 138 140 141  K 3.4* 4.1 3.9  CL 105 106 103  CO2 '24 23 27  '$ GLUCOSE 121* 143* 133*  BUN 21 29* 30*  CREATININE 1.15 1.22 1.51*  CALCIUM 8.9 9.1 9.0  MG 1.6*  --   --   GFRNONAA >60 58* 45*  ANIONGAP '9 11 11    '$ Lipids No results for input(s): "CHOL", "TRIG", "HDL", "LABVLDL", "LDLCALC", "CHOLHDL" in the last 168 hours.  Hematology Recent Labs  Lab 02/10/22 0748 02/11/22 0622 02/12/22 0630  WBC 6.6 7.2 6.3  RBC 4.29 4.50 4.56  HGB 12.2* 12.9* 13.3  HCT 37.7* 39.5 41.2  MCV 87.9 87.8 90.4  MCH 28.4 28.7 29.2  MCHC 32.4 32.7 32.3  RDW 15.6* 15.6* 15.5  PLT 150 153 156   Thyroid  Recent Labs  Lab 02/09/22 0458  TSH 3.207    BNP Recent Labs  Lab 02/08/22 1627  BNP 1,751.8*    DDimer No results for input(s): "DDIMER" in the last 168  hours.   Radiology    CARDIAC CATHETERIZATION  Result Date: 02/11/2022 Conclusions: Severe ostial/proximal LMCA disease with focal, eccentric 70% stenosis.  There is also 20% proximal/mid LAD stenosis as well as minimal luminal irregularities in the RCA. Mildly elevated left heart filling pressures (PCWP/LVEDP 20 mmHg). Mild pulmonary hypertension (mean PAP 31 mmHg). Mildly reduced Fick cardiac output/index (CO 4.7 L/min, CI 2.4 L/min/m^2). Relatively small left radial artery by ultrasound with significant vasospasm.  Recommend alternative access for PCI. Recommendations: Images reviewed with Dr. Martinique; we will plan to transfer Mr. Blasdel to [facility] for revascularization of ostial/proximal LMCA stenosis.  The lesion appears well-suited to stenting; I would favor IVUS-guided PCI over CABG in the setting of low Syntax score, advanced age, and other comorbidities. Continue diuresis and optimization of GDMT for acute HFrEF. Aggressive secondary prevention of coronary artery disease. Nelva Bush, MD [facility]   Cardiac  Studies   Cath: 02/11/22  Conclusions: Severe ostial/proximal LMCA disease with focal, eccentric 70% stenosis.  There is also 20% proximal/mid LAD stenosis as well as minimal luminal irregularities in the RCA. Mildly elevated left heart filling pressures (PCWP/LVEDP 20 mmHg). Mild pulmonary hypertension (mean PAP 31 mmHg). Mildly reduced Fick cardiac output/index (CO 4.7 L/min, CI 2.4 L/min/m^2). Relatively small left radial artery by ultrasound with significant vasospasm.  Recommend alternative access for PCI.   Recommendations: Images reviewed with Dr. Martinique; we will plan to transfer Mr. Spitler to [facility] for revascularization of ostial/proximal LMCA stenosis.  The lesion appears well-suited to stenting; I would favor IVUS-guided PCI over CABG in the setting of low Syntax score, advanced age, and other comorbidities. Continue diuresis and optimization of GDMT for acute HFrEF. Aggressive secondary prevention of coronary artery disease.   Nelva Bush, MD [facility]  Diagnostic Dominance: Right  Echo: 02/09/22  IMPRESSIONS     1. Left ventricular ejection fraction, by estimation, is 25 to 30%. The  left ventricle has severely decreased function. The left ventricle  demonstrates global hypokinesis. The left ventricular internal cavity size  was mildly dilated. There is moderate  left ventricular hypertrophy. Left ventricular diastolic parameters are  indeterminate.   2. Right ventricular systolic function is normal. The right ventricular  size is normal. There is mildly elevated pulmonary artery systolic  pressure.   3. Left atrial size was moderately dilated.   4. Right atrial size was moderately dilated.   5. The mitral valve is normal in structure. Mild to moderate mitral valve  regurgitation. No evidence of mitral stenosis. Moderate mitral annular  calcification.   6. Tricuspid valve regurgitation is moderate.   7. The aortic valve is normal in structure. Aortic  valve regurgitation is  mild. Aortic valve sclerosis/calcification is present, without any  evidence of aortic stenosis.   8. Pulmonic valve regurgitation is moderate.   9. The inferior vena cava is normal in size with <50% respiratory  variability, suggesting right atrial pressure of 8 mmHg.   FINDINGS   Left Ventricle: Left ventricular ejection fraction, by estimation, is 25  to 30%. The left ventricle has severely decreased function. The left  ventricle demonstrates global hypokinesis. The left ventricular internal  cavity size was mildly dilated. There  is moderate left ventricular hypertrophy. Left ventricular diastolic  parameters are indeterminate.   Right Ventricle: The right ventricular size is normal. No increase in  right ventricular wall thickness. Right ventricular systolic function is  normal. There is mildly elevated pulmonary artery systolic pressure. The  tricuspid regurgitant velocity  is 2.99   m/s, and with an assumed right atrial pressure of 8 mmHg, the estimated  right ventricular systolic pressure is 17.4 mmHg.   Left Atrium: Left atrial size was moderately dilated.   Right Atrium: Right atrial size was moderately dilated.   Pericardium: There is no evidence of pericardial effusion.   Mitral Valve: The mitral valve is normal in structure. Moderate mitral  annular calcification. Mild to moderate mitral valve regurgitation. No  evidence of mitral valve stenosis.   Tricuspid Valve: The tricuspid valve is normal in structure. Tricuspid  valve regurgitation is moderate . No evidence of tricuspid stenosis.   Aortic Valve: The aortic valve is normal in structure. Aortic valve  regurgitation is mild. Aortic regurgitation PHT measures 470 msec. Aortic  valve sclerosis/calcification is present, without any evidence of aortic  stenosis. Aortic valve mean gradient  measures 2.0 mmHg. Aortic valve peak gradient measures 2.5 mmHg. Aortic  valve area, by VTI measures  2.03 cm.   Pulmonic Valve: The pulmonic valve was normal in structure. Pulmonic valve  regurgitation is moderate. No evidence of pulmonic stenosis.   Aorta: The aortic root is normal in size and structure.   Venous: The inferior vena cava is normal in size with less than 50%  respiratory variability, suggesting right atrial pressure of 8 mmHg.   IAS/Shunts: No atrial level shunt detected by color flow Doppler.   Patient Profile     86 y.o. male with persistent A-fib status post ablation in 04/2020 with recurrent A-fib status post DCCV in 05/2020 on Pradaxa, DM2, HTN, HLD, strong family history of CAD, melanoma, prostate cancer, and sleep apnea on CPAP who was admitted to [facility] on 02/08/2022 with  acute HFrEF and A-fib with RVR.  He underwent R/LHC on the afternoon of 02/11/2022 which showed severe ostial left main stenosis with recommendation to transfer to [facility] for revascularization of the ostial/proximal left main.   Assessment & Plan    CAD Elevated Troponin -- Cath 02/11/2022 showed severe ostial/proximal left main stenosis as outlined above with recommendation to transfer to [name] for revascularization -- remains on IV heparin, ASA, statin, and BB therapy -- planned for LM atherectomy for tomorrow with Dr. Burt Knack -- plavix load '300mg'$  x1 now, then '75mg'$  daily  Acute HFrEF: Echo showed LVEF of 25-30% with global hypokinesis, moderate LVH, normal RV function, moderate biatrial enlargement -- continue Toprol-XL and losartan with recommendation to transition to [facility] prior to discharge as blood pressure allows -- consider addition of MRA/SGLT2 prior to discharge  -- hold on additional IV lasix   Persistent A-fib: Status post prior ablation and DCCV with recurrent A-fib, rates in the low 100s -- Anticoagulation with heparin with recommendation to resume [place] prior to discharge -- would plan for follow-up with EP for consideration of antiarrhythmic therapy versus repeat ablation   HTN:  controlled -- continue Toprol and losartan   HLD: LDL 38 in 10/2021 -- Atorvastatin '80mg'$  daily    DM2: A1c 6.4 -- SSI -- PTA Tradjenta  AKI: Cr 1.15>>1.22>>1.51 -- holding addition IV lasix, hold losartan dose today  For questions or updates, please contact [facility] Please consult [email or web address] for contact info under      Signed, Reino Bellis, NP  02/12/2022, 7:50 AM

## 2022-02-12 NOTE — Telephone Encounter (Signed)
Pharmacy Patient Advocate Encounter  Insurance verification completed.    The patient is insured through [employer]   The patient is currently admitted and ran test claims for the following: Antonio Clark, Jardiance.  Copays and coinsurance results were relayed to Inpatient clinical team.

## 2022-02-12 NOTE — Progress Notes (Signed)
CARDIAC REHAB PHASE I   PRE:  Rate/Rhythm: 98 afib  BP:  Sitting: 100/67      SaO2: 96 RA  MODE:  Ambulation: 220 ft   POST:  Rate/Rhythm: 120 Afib  BP:  Sitting: 111/92      SaO2: 96 RA  Pt feeling good today. No SOB or CP. Would like to walk.pt ambulated in hall with no SOB or CP. Pt back to room in bed with call bell and bedside table in reach. All questions and concerns addressed with pt and family. Will continue to follow. Plan for cath lab in am.   [id number] Vanessa Barbara, RN BSN 02/12/2022 11:20 AM

## 2022-02-12 NOTE — Progress Notes (Signed)
ANTICOAGULATION CONSULT NOTE  Pharmacy Consult for Heparin Drip Indication: atrial fibrillation  No Known Allergies  Patient Measurements: Weight: 74.1 kg (163 lb 6.4 oz) Heparin Dosing Weight: 82 Kg  Vital Signs: Temp: 97.8 F (36.6 C) (07/06 0455) Temp Source: Oral (07/06 0455) BP: 107/81 (07/06 0455) Pulse Rate: 106 (07/06 0455)  Labs: Recent Labs    02/10/22 0748 02/10/22 1657 02/11/22 0622 02/12/22 0630  HGB 12.2*  --  12.9* 13.3  HCT 37.7*  --  39.5 41.2  PLT 150  --  153 156  APTT  --   --   --  106*  HEPARINUNFRC 0.53 0.61 0.55 0.43  CREATININE 1.15  --  1.22 1.51*     Estimated Creatinine Clearance: 36.4 mL/min (A) (by C-G formula based on SCr of 1.51 mg/dL (H)).   Medical History: Past Medical History:  Diagnosis Date   Anal fissure    Arthritis    Atrial fibrillation ([place])    Back pain    Colon polyps    Diabetes mellitus type 2 with complications ([place])    Dysrhythmia    a-fib   GERD (gastroesophageal reflux disease)    History of echocardiogram 07/ 07/ 2011   History of lithotripsy 1989   Hyperlipidemia    Hypertension    Kidney stones    Melanoma ([place]) 05/01/2019   right chest wall (12/20)   Prostate CA ([place]) 2010   Sleep apnea    uses CPAP nightly   Squamous cell carcinoma of skin 05/23/1992   bowens-left parietal scalp ([id number])   Squamous cell carcinoma of skin 03/14/2008   in situ-left upper outer forehead-medial ([id number])   Squamous cell carcinoma of skin 03/14/2008   in situ-crown of scalp ([id number])   Squamous cell carcinoma of skin 04/15/2011   in situ-right dorsal forearm (txpbx)   Squamous cell carcinoma of skin 10/16/2011   in situ-left sideburn   Squamous cell carcinoma of skin 03/11/2015   ka-left sideburn ([id number])   Squamous cell carcinoma of skin 03/11/2015   ka-left forearm ([id number])   Squamous cell carcinoma of skin 06/22/2018   in situ-left forearm, sup (txpbx)   Squamous cell carcinoma of skin 05/01/2019   in  situ-mid anterior scalp    Squamous cell carcinoma of skin 05/01/2019   in situ-right upper arm    Medications:  Medications Prior to Admission  Medication Sig Dispense Refill Last Dose   acetaminophen (TYLENOL) 325 MG tablet Take 2 tablets (650 mg total) by mouth every 6 (six) hours as needed for mild pain (or Fever >/= 101).      acetaminophen (TYLENOL) 650 MG suppository Place 1 suppository (650 mg total) rectally every 6 (six) hours as needed for mild pain (or Fever >/= 101). 12 suppository 0    atorvastatin (LIPITOR) 10 MG tablet Take 10 mg by mouth daily.      budesonide-formoterol (SYMBICORT) 160-4.5 MCG/ACT inhaler Inhale 2 puffs into the lungs 2 (two) times daily. 1 each 3    cetirizine (ZYRTEC) 10 MG tablet TAKE 1 TABLET BY MOUTH EVERY DAY. NOT COVERED BY INSURANCE. 90 tablet 3    diclofenac Sodium (VOLTAREN) 1 % GEL Apply 2 g topically 4 (four) times daily. 50 g 1    finasteride (PROSCAR) 5 MG tablet Take 5 mg by mouth daily.      fluticasone (FLONASE) 50 MCG/ACT nasal spray Place 1 spray into both nostrils daily as needed for allergies or rhinitis.      insulin aspart (NOVOLOG) 100  UNIT/ML injection Inject 0-9 Units into the skin 4 (four) times daily -  before meals and at bedtime. 10 mL 11    linagliptin (TRADJENTA) 5 MG TABS tablet Take 1 tablet (5 mg total) by mouth daily. 90 tablet 3    LORazepam (ATIVAN) 0.5 MG tablet Take 1 tablet (0.5 mg total) by mouth 2 (two) times daily as needed for anxiety. 30 tablet 1    losartan (COZAAR) 25 MG tablet Take 0.5 tablets (12.5 mg total) by mouth daily.      metoprolol tartrate (LOPRESSOR) 50 MG tablet Take 1 tablet (50 mg total) by mouth 3 (three) times daily.      mupirocin ointment (BACTROBAN) 2 % Apply 1 Application topically 2 (two) times daily. 22 g 1    ondansetron (ZOFRAN) 4 MG tablet Take 1 tablet (4 mg total) by mouth every 6 (six) hours as needed for nausea. 20 tablet 0    traZODone (DESYREL) 50 MG tablet Take 0.5 tablets (25 mg  total) by mouth at bedtime as needed for sleep.       Assessment: Patient is a 86yo male on Pradaxa PTA (Last dose of Pradaxa 7/3). Patient now s/p cardiac cath and transferred from [facility] to [facility] for LMCA PCI on heparin drip.  -heparin level at goal on 1200 units/hr, CBC stable    Goal of Therapy:  Heparin level 0.3-0.7 units/ml Monitor platelets by anticoagulation protocol: Yes   Plan:  Heparin gtt 1200 units/hr Daily heparin level and CBC  Hildred Laser, PharmD Clinical Pharmacist **Pharmacist phone directory can now be found on [email or web address] ([email or web address]).  Listed under [place].

## 2022-02-12 NOTE — Progress Notes (Signed)
Mobility Specialist Progress Note    02/12/22 1540  Mobility  Activity Ambulated independently in hallway  Level of Assistance Standby assist, set-up cues, supervision of patient - no hands on  Assistive Device None  Distance Ambulated (ft) 380 ft  Activity Response Tolerated well  $Mobility charge 1 Mobility   Pre-Mobility: 96 HR Post-Mobility: 98 HR  Pt received in chair and agreeable. No complaints on walk. Returned to chair with call bell in reach.    Hildred Alamin Mobility Specialist

## 2022-02-12 NOTE — TOC Benefit Eligibility Note (Signed)
Patient Teacher, English as a foreign language completed.    The patient is currently admitted and upon discharge could be taking Entresto 24-26 mg.  The current 30 day co-pay is, $323.28.   The patient is currently admitted and upon discharge could be taking Jardiance 10 mg.  The current 30 day co-pay is, $172.30.   The patient is currently admitted and upon discharge could be taking Farxiga 10 mg.  Not Covered  The patient is insured through [place], [place] Patient Advocate Specialist [place] Patient Advocate Team Direct Number: [phone]  Fax: [phone]

## 2022-02-13 ENCOUNTER — Encounter (HOSPITAL_COMMUNITY)
Admission: AD | Disposition: A | Discharge status: Home Health | Payer: Self-pay | Source: Other Acute Inpatient Hospital | Attending: Cardiology

## 2022-02-13 DIAGNOSIS — I2510 Atherosclerotic heart disease of native coronary artery without angina pectoris: Secondary | ICD-10-CM

## 2022-02-13 HISTORY — PX: CORONARY ANGIOGRAPHY: CATH118303

## 2022-02-13 HISTORY — PX: INTRAVASCULAR ULTRASOUND/IVUS: CATH118244

## 2022-02-13 LAB — CBC
HCT: 39.3 % (ref 39.0–52.0)
Hemoglobin: 12.4 g/dL — ABNORMAL LOW (ref 13.0–17.0)
MCH: 28.7 pg (ref 26.0–34.0)
MCHC: 31.6 g/dL (ref 30.0–36.0)
MCV: 91 fL (ref 80.0–100.0)
Platelets: 138 10*3/uL — ABNORMAL LOW (ref 150–400)
RBC: 4.32 MIL/uL (ref 4.22–5.81)
RDW: 15.4 % (ref 11.5–15.5)
WBC: 6 10*3/uL (ref 4.0–10.5)
nRBC: 0 % (ref 0.0–0.2)

## 2022-02-13 LAB — BASIC METABOLIC PANEL
Anion gap: 9 (ref 5–15)
BUN: 27 mg/dL — ABNORMAL HIGH (ref 8–23)
CO2: 25 mmol/L (ref 22–32)
Calcium: 8.9 mg/dL (ref 8.9–10.3)
Chloride: 107 mmol/L (ref 98–111)
Creatinine, Ser: 1.27 mg/dL — ABNORMAL HIGH (ref 0.61–1.24)
GFR, Estimated: 56 mL/min — ABNORMAL LOW (ref >60)
Glucose, Bld: 122 mg/dL — ABNORMAL HIGH (ref 70–99)
Potassium: 4.1 mmol/L (ref 3.5–5.1)
Sodium: 141 mmol/L (ref 135–145)

## 2022-02-13 LAB — GLUCOSE, CAPILLARY
Glucose-Capillary: 125 mg/dL — ABNORMAL HIGH (ref 70–99)
Glucose-Capillary: 145 mg/dL — ABNORMAL HIGH (ref 70–99)
Glucose-Capillary: 169 mg/dL — ABNORMAL HIGH (ref 70–99)
Glucose-Capillary: 156 mg/dL — ABNORMAL HIGH (ref 70–99)
Glucose-Capillary: 217 mg/dL — ABNORMAL HIGH (ref 70–99)

## 2022-02-13 LAB — POCT ACTIVATED CLOTTING TIME: Activated Clotting Time: 311 seconds

## 2022-02-13 LAB — LIPOPROTEIN A (LPA): Lipoprotein (a): 49.7 nmol/L — ABNORMAL HIGH (ref <75.0)

## 2022-02-13 LAB — HEPARIN LEVEL (UNFRACTIONATED): Heparin Unfractionated: 0.5 IU/mL (ref 0.30–0.70)

## 2022-02-13 SURGERY — INTRAVASCULAR ULTRASOUND/IVUS
Anesthesia: LOCAL

## 2022-02-13 MED ORDER — HYDRALAZINE HCL 20 MG/ML IJ SOLN
10.0000 mg | INTRAMUSCULAR | Status: AC | PRN
Start: 2022-02-13 — End: 2022-02-13

## 2022-02-13 MED ORDER — SODIUM CHLORIDE 0.9 % IV SOLN: 250.0000 mL | INTRAVENOUS | Status: DC | PRN

## 2022-02-13 MED ORDER — LIDOCAINE-EPINEPHRINE 1 %-1:100000 IJ SOLN
INTRAMUSCULAR | Status: DC | PRN
  Administered 2022-02-13: 8 mL

## 2022-02-13 MED ORDER — FENTANYL CITRATE (PF) 100 MCG/2ML IJ SOLN
INTRAMUSCULAR | Status: DC | PRN
  Administered 2022-02-13: 25 ug via INTRAVENOUS

## 2022-02-13 MED ORDER — SODIUM CHLORIDE 0.9 % IV SOLN: INTRAVENOUS | Status: AC

## 2022-02-13 MED ORDER — DABIGATRAN ETEXILATE MESYLATE 150 MG PO CAPS
150.0000 mg | ORAL_CAPSULE | Freq: Two times a day (BID) | ORAL | Status: DC
  Administered 2022-02-13 – 2022-02-18 (×10): 150 mg via ORAL
  Filled 2022-02-13 (×11): qty 1

## 2022-02-13 MED ORDER — LORAZEPAM 0.5 MG PO TABS
0.5000 mg | ORAL_TABLET | Freq: Four times a day (QID) | ORAL | Status: DC | PRN
  Administered 2022-02-13 – 2022-02-16 (×5): 0.5 mg via ORAL
  Filled 2022-02-13 (×6): qty 1

## 2022-02-13 MED ORDER — AMIODARONE HCL IN DEXTROSE 360-4.14 MG/200ML-% IV SOLN
30.0000 mg/h | INTRAVENOUS | Status: DC
  Administered 2022-02-13 – 2022-02-16 (×6): 30 mg/h via INTRAVENOUS
  Filled 2022-02-13 (×7): qty 200

## 2022-02-13 MED ORDER — MIDAZOLAM HCL 2 MG/2ML IJ SOLN
INTRAMUSCULAR | Status: AC
  Filled 2022-02-13: qty 2

## 2022-02-13 MED ORDER — SODIUM CHLORIDE 0.9 % IV SOLN
INTRAVENOUS | Status: AC | PRN
  Administered 2022-02-13: 10 mL/h via INTRAVENOUS

## 2022-02-13 MED ORDER — AMIODARONE HCL IN DEXTROSE 360-4.14 MG/200ML-% IV SOLN
60.0000 mg/h | INTRAVENOUS | Status: AC
  Administered 2022-02-13 (×2): 60 mg/h via INTRAVENOUS
  Filled 2022-02-13 (×2): qty 200

## 2022-02-13 MED ORDER — FENTANYL CITRATE (PF) 100 MCG/2ML IJ SOLN
INTRAMUSCULAR | Status: AC
  Filled 2022-02-13: qty 2

## 2022-02-13 MED ORDER — LIDOCAINE HCL (PF) 1 % IJ SOLN
INTRAMUSCULAR | Status: AC
  Filled 2022-02-13: qty 30

## 2022-02-13 MED ORDER — LIDOCAINE-EPINEPHRINE 1 %-1:100000 IJ SOLN
INTRAMUSCULAR | Status: AC
  Filled 2022-02-13: qty 1

## 2022-02-13 MED ORDER — SODIUM CHLORIDE 0.9% FLUSH: 3.0000 mL | INTRAVENOUS | Status: DC | PRN

## 2022-02-13 MED ORDER — LABETALOL HCL 5 MG/ML IV SOLN: 10.0000 mg | INTRAVENOUS | Status: AC | PRN

## 2022-02-13 MED ORDER — HEPARIN (PORCINE) IN NACL 1000-0.9 UT/500ML-% IV SOLN
INTRAVENOUS | Status: DC | PRN
  Administered 2022-02-13 (×2): 500 mL

## 2022-02-13 MED ORDER — HEPARIN SODIUM (PORCINE) 1000 UNIT/ML IJ SOLN
INTRAMUSCULAR | Status: DC | PRN
  Administered 2022-02-13: 7000 [IU] via INTRAVENOUS

## 2022-02-13 MED ORDER — SODIUM CHLORIDE 0.9% FLUSH
3.0000 mL | Freq: Two times a day (BID) | INTRAVENOUS | Status: DC
Start: 2022-02-13 — End: 2022-02-21
  Administered 2022-02-13 – 2022-02-20 (×10): 3 mL via INTRAVENOUS

## 2022-02-13 MED ORDER — MIDAZOLAM HCL 2 MG/2ML IJ SOLN
INTRAMUSCULAR | Status: DC | PRN
  Administered 2022-02-13: 1 mg via INTRAVENOUS

## 2022-02-13 MED ORDER — IOHEXOL 350 MG/ML SOLN
INTRAVENOUS | Status: DC | PRN
  Administered 2022-02-13: 35 mL

## 2022-02-13 MED ORDER — AMIODARONE LOAD VIA INFUSION
150.0000 mg | Freq: Once | INTRAVENOUS | Status: AC
  Administered 2022-02-13: 150 mg via INTRAVENOUS
  Filled 2022-02-13: qty 83.34

## 2022-02-13 MED ORDER — NITROGLYCERIN 1 MG/10 ML FOR IR/CATH LAB
INTRA_ARTERIAL | Status: AC
  Filled 2022-02-13: qty 10

## 2022-02-13 SURGICAL SUPPLY — 18 items
CATH OPTICROSS HD (CATHETERS) ×1 IMPLANT
CATH VISTA GUIDE 6FR XBLAD3.5 (CATHETERS) ×1 IMPLANT
CLOSURE PERCLOSE PROSTYLE (VASCULAR PRODUCTS) ×1 IMPLANT
ELECT DEFIB PAD ADLT CADENCE (PAD) ×1 IMPLANT
KIT ENCORE 26 ADVANTAGE (KITS) ×1 IMPLANT
KIT HEART LEFT (KITS) ×2 IMPLANT
KIT MICROPUNCTURE NIT STIFF (SHEATH) ×1 IMPLANT
PACK CARDIAC CATHETERIZATION (CUSTOM PROCEDURE TRAY) ×2 IMPLANT
SHEATH PINNACLE 6F 10CM (SHEATH) ×1 IMPLANT
SHEATH PROBE COVER 6X72 (BAG) ×1 IMPLANT
SLED PULL BACK IVUS (MISCELLANEOUS) ×1 IMPLANT
STOPCOCK MORSE 400PSI 3WAY (MISCELLANEOUS) ×2 IMPLANT
TRANSDUCER W/STOPCOCK (MISCELLANEOUS) ×2 IMPLANT
TUBING CIL FLEX 10 FLL-RA (TUBING) ×2 IMPLANT
VALVE GUARDIAN II ~~LOC~~ HEMO (MISCELLANEOUS) ×1 IMPLANT
WIRE COUGAR XT STRL 190CM (WIRE) ×1 IMPLANT
WIRE EMERALD 3MM-J .035X150CM (WIRE) ×1 IMPLANT
WIRE MICROINTRODUCER 60CM (WIRE) ×1 IMPLANT

## 2022-02-13 NOTE — Progress Notes (Signed)
Chronic Care Management Pharmacy Note  02/27/2022 Name:  Antonio Clark MRN:  [id number] DOB:  Apr 24, 1936  Summary: PharmD FU visit.  Recent admission for HF.  Now on GDMT for HF and all chronic conditions seem to be controlled.  Need to monitor weight at home.  Recommendations/Changes made from today's visit: Consider dose increase of Jardiance pending updated A1c.  Could stop Tradjenta if needbe.  Plan: FU 30 days CMA HF call   Subjective: Antonio Clark is an 86 y.o. year old male who is a primary patient of Pickard, Cammie Mcgee, MD.  The CCM team was consulted for assistance with disease management and care coordination needs.    Engaged with patient by telephone for follow up visit in response to provider referral for pharmacy case management and/or care coordination services.   Consent to Services:  The patient was given the following information about Chronic Care Management services today, agreed to services, and gave verbal consent: 1. CCM service includes personalized support from designated clinical staff supervised by the primary care provider, including individualized plan of care and coordination with other care providers 2. 24/7 contact phone numbers for assistance for urgent and routine care needs. 3. Service will only be billed when office clinical staff spend 20 minutes or more in a month to coordinate care. 4. Only one practitioner may furnish and bill the service in a calendar month. 5.The patient may stop CCM services at any time (effective at the end of the month) by phone call to the office staff. 6. The patient will be responsible for cost sharing (co-pay) of up to 20% of the service fee (after annual deductible is met). Patient agreed to services and consent obtained.  Patient Care Team: Susy Frizzle, MD as PCP - General (Family Medicine) Jerline Pain, MD as PCP - Cardiology (Cardiology) Vickie Epley, MD as PCP - Electrophysiology (Cardiology) Lavonna Monarch, MD as Consulting Physician (Dermatology) Edythe Clarity, [facility] as Pharmacist (Pharmacist)  Recent office visits:  01/20/22 Antonio Luo MD - Family Medicine - Wheezing - budesonide-formoterol (SYMBICORT) 160-4.5 MCG/ACT inhaler and LORazepam (ATIVAN) 0.5 MG tablet prescribed. Follow up as scheduled.   12/29/21 Antonio Luo, MD - Family Medicine - TMJ - diclofenac Sodium (VOLTAREN) 1 % GEL prescribed.    Recent consult visits:  12/16/21 Antonio Furbish, MD - Cardiology - Afib - EKG performed. atorvastatin (LIPITOR) 10 MG tablet prescribed. Follow up in 6 months.    11/22/21 Antonio Clark - Urology - Urethral stricture - Bactrim prescribed. Follow up as scheduled.    Hospital visits:  None in previous 6 months   Objective:  Lab Results  Component Value Date   CREATININE 1.52 (H) 02/24/2022   BUN 29 (H) 02/24/2022   GFR 78.21 08/21/2013   EGFR 57 (L) 05/05/2021   GFRNONAA 45 (L) 02/24/2022   GFRAA 61 12/17/2020   NA 140 02/24/2022   K 4.2 02/24/2022   CALCIUM 8.7 (L) 02/24/2022   CO2 25 02/24/2022   GLUCOSE 109 (H) 02/24/2022    Lab Results  Component Value Date/Time   HGBA1C 6.4 (H) 02/09/2022 04:58 AM   HGBA1C 6.3 (H) 10/30/2021 12:03 PM   GFR 78.21 08/21/2013 12:19 PM   MICROALBUR 8.8 12/17/2020 08:27 AM   MICROALBUR 46.2 06/07/2020 09:57 AM    Last diabetic Eye exam: No results found for: "HMDIABEYEEXA"  Last diabetic Foot exam: No results found for: "HMDIABFOOTEX"   Lab Results  Component Value Date   CHOL  103 10/30/2021   HDL 48 10/30/2021   LDLCALC 38 10/30/2021   TRIG 83 10/30/2021   CHOLHDL 2.1 10/30/2021       Latest Ref Rng & Units 02/17/2022    6:08 AM 10/30/2021   12:03 PM 05/05/2021   10:40 AM  Hepatic Function  Total Protein 6.1 - 8.1 g/dL  6.6  7.0   Albumin 3.5 - 5.0 g/dL 2.9     AST 10 - 35 U/L  11  16   ALT 9 - 46 U/L  7  11   Total Bilirubin 0.2 - 1.2 mg/dL  0.6  1.0     Lab Results  Component Value Date/Time   TSH 3.207  02/09/2022 04:58 AM   TSH 1.52 03/07/2021 01:00 PM   TSH 1.98 06/07/2020 09:57 AM       Latest Ref Rng & Units 02/22/2022    3:32 AM 02/21/2022    2:41 AM 02/20/2022    5:24 AM  CBC  WBC 4.0 - 10.5 K/uL 7.5  6.5  6.3   Hemoglobin 13.0 - 17.0 g/dL 11.8  11.2  11.0   Hematocrit 39.0 - 52.0 % 35.7  34.0  33.8   Platelets 150 - 400 K/uL 118  89  88     No results found for: "VD25OH"  Clinical ASCVD: No  The ASCVD Risk score (Arnett DK, et al., 2019) failed to calculate for the following reasons:   The 2019 ASCVD risk score is only valid for ages 72 to 33       01/20/2022   11:41 AM 12/29/2021    4:31 PM 10/30/2021   11:29 AM  Depression screen PHQ 2/9  Decreased Interest 0 0 0  Down, Depressed, Hopeless 0 0 0  PHQ - 2 Score 0 0 0   )  Social History   Tobacco Use  Smoking Status Never  Smokeless Tobacco Never   BP Readings from Last 3 Encounters:  02/24/22 113/68  02/11/22 105/67  01/20/22 112/70   Pulse Readings from Last 3 Encounters:  02/24/22 60  02/11/22 94  01/20/22 (!) 119   Wt Readings from Last 3 Encounters:  02/24/22 163 lb 6.4 oz (74.1 kg)  02/11/22 172 lb (78 kg)  01/20/22 180 lb (81.6 kg)   BMI Readings from Last 3 Encounters:  02/24/22 24.13 kg/m  02/11/22 25.40 kg/m  01/20/22 26.58 kg/m    Assessment/Interventions: Review of patient past medical history, allergies, medications, health status, including review of consultants reports, laboratory and other test data, was performed as part of comprehensive evaluation and provision of chronic care management services.   SDOH:  (Social Determinants of Health) assessments and interventions performed: No, assessed within the last year  Financial Resource Strain: Low Risk  (10/30/2021)   Overall Financial Resource Strain (CARDIA)    Difficulty of Paying Living Expenses: Not hard at all    SDOH Screenings   Alcohol Screen: Low Risk  (10/30/2021)   Alcohol Screen    Last Alcohol Screening Score  (AUDIT): 0  Depression (PHQ2-9): Low Risk  (01/20/2022)   Depression (PHQ2-9)    PHQ-2 Score: 0  Financial Resource Strain: Low Risk  (10/30/2021)   Overall Financial Resource Strain (CARDIA)    Difficulty of Paying Living Expenses: Not hard at all  Food Insecurity: No Food Insecurity (10/30/2021)   Hunger Vital Sign    Worried About Running Out of Food in the Last Year: Never true    Ran Out of Food in  the Last Year: Never true  Housing: Low Risk  (10/30/2021)   Housing    Last Housing Risk Score: 0  Physical Activity: Inactive (10/30/2021)   Exercise Vital Sign    Days of Exercise per Week: 0 days    Minutes of Exercise per Session: 0 min  Social Connections: Moderately Integrated (10/30/2021)   Social Connection and Isolation Panel [NHANES]    Frequency of Communication with Friends and Family: More than three times a week    Frequency of Social Gatherings with Friends and Family: More than three times a week    Attends Religious Services: More than 4 times per year    Active Member of Clubs or Organizations: Yes    Attends Archivist Meetings: More than 4 times per year    Marital Status: Never married  Stress: No Stress Concern Present (10/30/2021)   [employer] [place]    Feeling of Stress : Not at all  Tobacco Use: Low Risk  (02/17/2022)   Patient History    Smoking Tobacco Use: Never    Smokeless Tobacco Use: Never    Passive Exposure: Not on file  Transportation Needs: No Transportation Needs (10/30/2021)   PRAPARE - Transportation    Lack of Transportation (Medical): No    Lack of Transportation (Non-Medical): No    CCM Care Plan  No Known Allergies  Medications Reviewed Today     Reviewed by Edythe Clarity, [facility] (Pharmacist) on 02/27/22 at 0757  Med List Status: <None>   Medication Order Taking? Sig Documenting Provider Last Dose Status Informant  acetaminophen (TYLENOL) 325 MG tablet [id number]  Yes Take 2 tablets (650 mg total) by mouth every 6 (six) hours as needed for mild pain (or Fever >/= 101). Emeterio Reeve, DO Taking Active   acetaminophen (TYLENOL) 650 MG suppository [id number] Yes Place 1 suppository (650 mg total) rectally every 6 (six) hours as needed for mild pain (or Fever >/= 101). Emeterio Reeve, DO Taking Active   amiodarone (PACERONE) 200 MG tablet [id number] Yes Take 1 tablet (200 mg total) by mouth daily. Margie Billet, PA-C Taking Active   aspirin EC 81 MG tablet [id number] Yes Take 1 tablet (81 mg total) by mouth daily. Swallow whole. Margie Billet, PA-C Taking Active   atorvastatin (LIPITOR) 80 MG tablet [id number] Yes Take 1 tablet (80 mg total) by mouth daily. Margie Billet, PA-C Taking Active   budesonide-formoterol [facility]) 160-4.5 MCG/ACT inhaler [id number] Yes Inhale 2 puffs into the lungs 2 (two) times daily. Susy Frizzle, MD Taking Active Spouse/Significant Other  cetirizine (ZYRTEC) 10 MG tablet [id number] Yes TAKE 1 TABLET BY MOUTH EVERY DAY. NOT COVERED BY INSURANCE. Susy Frizzle, MD Taking Active Spouse/Significant Other  dabigatran (PRADAXA) 150 MG CAPS capsule [id number] Yes Take 150 mg by mouth 2 (two) times daily. [provider] Taking Active   diclofenac Sodium (VOLTAREN) 1 % GEL [id number] Yes Apply 2 g topically 4 (four) times daily. Susy Frizzle, MD Taking Active Spouse/Significant Other  empagliflozin (JARDIANCE) 10 MG TABS tablet [id number] Yes Take 1 tablet (10 mg total) by mouth daily before breakfast. [place], Peter M, MD Taking Active   finasteride (PROSCAR) 5 MG tablet [id number] Yes Take 5 mg by mouth daily. [provider] Taking Active Spouse/Significant Other  fluticasone (FLONASE) 50 MCG/ACT nasal spray [id number] Yes Place 1 spray into both nostrils daily as needed for allergies or rhinitis. [provider] Taking Active Spouse/Significant Other  furosemide (LASIX) 40 MG tablet  [id number] Yes Take 1 tablet (40 mg total) by mouth daily. Margie Billet, PA-C Taking Active   linagliptin (TRADJENTA) 5 MG TABS tablet [id number] Yes Take 1 tablet (5 mg total) by mouth daily. Susy Frizzle, MD Taking Active Spouse/Significant Other  LORazepam (ATIVAN) 0.5 MG tablet [id number] Yes Take 1 tablet (0.5 mg total) by mouth 2 (two) times daily as needed for anxiety. Susy Frizzle, MD Taking Active Spouse/Significant Other  metoprolol succinate (TOPROL-XL) 50 MG 24 hr tablet [id number] Yes Take 1 tablet (50 mg total) by mouth daily. Take with or immediately following a meal. Margie Billet, PA-C Taking Active   mupirocin ointment (BACTROBAN) 2 % [id number] Yes Apply 1 Application topically 2 (two) times daily. Lavonna Monarch, MD Taking Active Spouse/Significant Other  nitroGLYCERIN (NITROSTAT) 0.4 MG SL tablet [id number] Yes Place 1 tablet (0.4 mg total) under the tongue every 5 (five) minutes x 3 doses as needed for chest pain. Margie Billet, PA-C Taking Active   ondansetron [facility]) 4 MG tablet [id number] Yes Take 1 tablet (4 mg total) by mouth every 6 (six) hours as needed for nausea. Emeterio Reeve, DO Taking Active   spironolactone (ALDACTONE) 25 MG tablet [id number] Yes Take 1/2 tablets (12.5 mg total) by mouth daily. Margie Billet, PA-C Taking Active   traZODone (DESYREL) 50 MG tablet [id number] Yes Take 0.5 tablets (25 mg total) by mouth at bedtime as needed for sleep. Emeterio Reeve, DO Taking Active             Patient Active Problem List   Diagnosis Date Noted   Acute on chronic systolic heart failure ([place])    AKI (acute kidney injury) ([place])    Unstable angina ([place]) 02/11/2022   Acute HFrEF (heart failure with reduced ejection fraction) (HCC)    Paroxysmal atrial fibrillation with rapid ventricular response ([place]) 02/09/2022   Acute CHF (congestive heart failure) ([place]) 02/08/2022   Type 2 diabetes mellitus with complication, without long-term  current use of insulin ([place]) 02/08/2022   Hypertensive urgency 02/08/2022   Arthritis of hand 09/02/2021   Olecranon bursitis 08/18/2021   Pain in joint of right elbow 08/14/2021   Imbalance 08/06/2021   Pain in joint of right hip 07/22/2021   Low back pain 07/22/2021   Fracture of sacrum ([place]) 07/14/2021   Orthostatic hypotension 05/02/2021   Melanoma ([place])    Malignant neoplasm of prostate ([place]) 07/15/2015   Recurrent nephrolithiasis 07/15/2015   Personal history of prostate cancer 04/25/2015   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/09/2013   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   Left main coronary artery disease 11/02/2011   Urethral stricture 06/22/2011   Hyperlipidemia    Borderline diabetic    Diabetes mellitus with coincident hypertension ([place])     Immunization History  Administered Date(s) Administered   Fluad Quad(high Dose 65+) 04/27/2019, 05/21/2020   Influenza, High Dose Seasonal PF 06/07/2017, 05/07/2018   Influenza,inj,Quad PF,6+ Mos 05/12/2016   Influenza-Unspecified 05/30/2015   PFIZER(Purple Top)SARS-COV-2 Vaccination 09/01/2019, 09/21/2019, 05/28/2020   Pneumococcal Conjugate-13 07/08/2015   Pneumococcal Polysaccharide-23 01/20/2016   Tdap 06/11/2011   Zoster Recombinat (Shingrix) 07/09/2017, 09/30/2017    Conditions to be addressed/monitored:  CHF, DM HTN, HLD  Care Plan : General Pharmacy (Adult)  Updates made by Edythe Clarity, RPH since 02/27/2022 12:00 AM     Problem: HF, DM   Priority: High  Onset Date: 02/27/2022  Note:    Current Barriers:  Recent hospital admission  for HF  Pharmacist Clinical Goal(s):  Patient will maintain control of fluid status as evidenced by swelling/sob  through collaboration with PharmD and provider.   Interventions: 1:1 collaboration with Susy Frizzle, MD regarding development and update of comprehensive plan of care as evidenced by provider attestation and co-signature Inter-disciplinary care team  collaboration (see longitudinal plan of care) Comprehensive medication review performed; medication list updated in electronic medical record  Diabetes (A1c goal <7%) 02/26/22 -Controlled -Current medications: Tradjenta 14m Appropriate, Effective, Safe, Accessible Jardiance 146mdaily Appropriate, Effective, Safe, Accessible -Medications previously tried: Novolog  -Current home glucose readings fasting glucose: not checking currently post prandial glucose: not checking currently -Denies hypoglycemic/hyperglycemic symptoms -Current exercise: minimal, currently.  Recently returned back from the hospital. -Educated on A1c and blood sugar goals; Complications of diabetes including kidney damage, retinal damage, and cardiovascular disease; Prevention and management of hypoglycemic episodes; Benefits of routine self-monitoring of blood sugar; -Counseled to check feet daily and get yearly eye exams -Counseled on diet and exercise extensively Recommended to continue current medication New start of Jardiance 1044mue to comorbidity of HF.  Monitor glucose and A1c.  IF A1c decreases or stays steady, would recommend increasing Jardiance to 81m31md stopping tradjenta or just stopping Tradjenta.  HF diagnosis make Jardiance most appropriate medication for patient.  Heart Failure (Goal: manage symptoms and prevent exacerbations) -Controlled -Last ejection fraction: <20%  (Date: 02/11/22) -Current treatment: Spironolactone 81mg69mropriate, Effective, Safe, Accessible Metoprolol XL 50mg 78mopriate, Effective, Safe, Accessible Jardiance 10mg A12mpriate, Effective, Safe, Accessible -Medications previously tried: none noted  -Current home BP/HR readings: BP has been controlled -Current dietary habits: watching salt -Current exercise habits: see above -Educated on Benefits of medications for managing symptoms and prolonging life Importance of weighing daily; if you gain more than 3 pounds in one day  or 5 pounds in one week, contact providers -Counseled on diet and exercise extensively Recommended to continue current medication Was not monitoring weight at this time, they will start doing so. Will set up with routine follow up to make sure they are monitoring weight to prevent readmission.  Patient Goals/Self-Care Activities Patient will:  - take medications as prescribed as evidenced by patient report and record review weigh daily, and contact provider if weight gain of 3 lbs in one day or 5 lbs in one week.  Follow Up Plan: The care management team will reach out to the patient again over the next 180 days.       Medication Assistance: None required.  Patient affirms current coverage meets needs.  Compliance/Adherence/Medication fill history: Care Gaps: Due for eye exam, foot exam  [facility]/: /Atorvastatin 80mg  726m23 90ds  Patient's preferred pharmacy is:  [id number] [id number] [place] [address] [phone] [address] [phone]: Benefits of medication synchronization, packaging and delivery as well as enhanced pharmacist oversight with [employer]. Patient decided to: Continue current medication management strategy  Care Plan and Follow Up Patient Decision:  Patient agrees to Care Plan and Follow-up.  Plan: The care management team will reach out to the patient again over the next 180 days.  ChristianBeverly Milch CPP Clinical Pharmacist Practitioner [phone]

## 2022-02-13 NOTE — Progress Notes (Signed)
ANTICOAGULATION CONSULT NOTE  Pharmacy Consult for Heparin Drip Indication: atrial fibrillation  No Known Allergies  Patient Measurements: Weight: 77.2 kg (170 lb 4.8 oz) Heparin Dosing Weight: 82 Kg  Vital Signs: Temp: 97.6 F (36.4 C) (07/07 0349) Temp Source: Oral (07/07 0349) BP: 119/73 (07/07 0349) Pulse Rate: 92 (07/07 0349)  Labs: Recent Labs    02/11/22 0622 02/12/22 0630 02/13/22 0334 02/13/22 0632  HGB 12.9* 13.3  --  12.4*  HCT 39.5 41.2  --  39.3  PLT 153 156  --  138*  APTT  --  106*  --   --   HEPARINUNFRC 0.55 0.43 0.50  --   CREATININE 1.22 1.51*  --  1.27*     Estimated Creatinine Clearance: 43.3 mL/min (A) (by C-G formula based on SCr of 1.27 mg/dL (H)).   Medical History: Past Medical History:  Diagnosis Date   Anal fissure    Arthritis    Atrial fibrillation ([place])    Back pain    Colon polyps    Diabetes mellitus type 2 with complications ([place])    Dysrhythmia    a-fib   GERD (gastroesophageal reflux disease)    History of echocardiogram 07/ 07/ 2011   History of lithotripsy 1989   Hyperlipidemia    Hypertension    Kidney stones    Melanoma ([place]) 05/01/2019   right chest wall (12/20)   Prostate CA ([place]) 2010   Sleep apnea    uses CPAP nightly   Squamous cell carcinoma of skin 05/23/1992   bowens-left parietal scalp (CX35FU)   Squamous cell carcinoma of skin 03/14/2008   in situ-left upper outer forehead-medial (CX35FU)   Squamous cell carcinoma of skin 03/14/2008   in situ-crown of scalp (Cx35FU)   Squamous cell carcinoma of skin 04/15/2011   in situ-right dorsal forearm (txpbx)   Squamous cell carcinoma of skin 10/16/2011   in situ-left sideburn   Squamous cell carcinoma of skin 03/11/2015   ka-left sideburn (CX35FU)   Squamous cell carcinoma of skin 03/11/2015   ka-left forearm (CX35FU)   Squamous cell carcinoma of skin 06/22/2018   in situ-left forearm, sup (txpbx)   Squamous cell carcinoma of skin 05/01/2019   in  situ-mid anterior scalp    Squamous cell carcinoma of skin 05/01/2019   in situ-right upper arm    Medications:  Medications Prior to Admission  Medication Sig Dispense Refill Last Dose   acetaminophen (TYLENOL) 325 MG tablet Take 2 tablets (650 mg total) by mouth every 6 (six) hours as needed for mild pain (or Fever >/= 101).      acetaminophen (TYLENOL) 650 MG suppository Place 1 suppository (650 mg total) rectally every 6 (six) hours as needed for mild pain (or Fever >/= 101). 12 suppository 0    atorvastatin (LIPITOR) 10 MG tablet Take 10 mg by mouth daily.      budesonide-formoterol (SYMBICORT) 160-4.5 MCG/ACT inhaler Inhale 2 puffs into the lungs 2 (two) times daily. 1 each 3    cetirizine (ZYRTEC) 10 MG tablet TAKE 1 TABLET BY MOUTH EVERY DAY. NOT COVERED BY INSURANCE. 90 tablet 3    diclofenac Sodium (VOLTAREN) 1 % GEL Apply 2 g topically 4 (four) times daily. 50 g 1    finasteride (PROSCAR) 5 MG tablet Take 5 mg by mouth daily.      fluticasone (FLONASE) 50 MCG/ACT nasal spray Place 1 spray into both nostrils daily as needed for allergies or rhinitis.      insulin aspart (  NOVOLOG) 100 UNIT/ML injection Inject 0-9 Units into the skin 4 (four) times daily -  before meals and at bedtime. 10 mL 11    linagliptin (TRADJENTA) 5 MG TABS tablet Take 1 tablet (5 mg total) by mouth daily. 90 tablet 3    LORazepam (ATIVAN) 0.5 MG tablet Take 1 tablet (0.5 mg total) by mouth 2 (two) times daily as needed for anxiety. 30 tablet 1    losartan (COZAAR) 25 MG tablet Take 0.5 tablets (12.5 mg total) by mouth daily.      metoprolol tartrate (LOPRESSOR) 50 MG tablet Take 1 tablet (50 mg total) by mouth 3 (three) times daily.      mupirocin ointment (BACTROBAN) 2 % Apply 1 Application topically 2 (two) times daily. 22 g 1    ondansetron (ZOFRAN) 4 MG tablet Take 1 tablet (4 mg total) by mouth every 6 (six) hours as needed for nausea. 20 tablet 0    traZODone (DESYREL) 50 MG tablet Take 0.5 tablets (25 mg  total) by mouth at bedtime as needed for sleep.       Assessment: Patient is a 86yo male on Pradaxa PTA (Last dose of Pradaxa 7/3). Patient now s/p cardiac cath and transferred from [facility] to [facility] for LMCA PCI on heparin drip.  -heparin level 0.5 at goal on heparin drip 1200 units/hr, CBC stable    Goal of Therapy:  Heparin level 0.3-0.7 units/ml Monitor platelets by anticoagulation protocol: Yes   Plan:  Heparin gtt 1200 units/hr Daily heparin level and CBC Follow up post cath     [place].D. CPP, BCPS Clinical Pharmacist [phone] 02/13/2022 8:36 AM   **Pharmacist phone directory can now be found on [email or web address] ([email or web address]).  Listed under [place].

## 2022-02-13 NOTE — Progress Notes (Signed)
Pt experience increasing fatigue and reports of feeling foggy headed. Pt SBP low in the low 90's and occasionally in the 80's. Pt denied shortness of breath and chest pain. Groin site maintaining being clean, dry and intact. Pt has been started on amio post cath. After several minutes symptoms subsided. Pt reports a hx of anxiety and takes Ativan occasionally at home. Paged cardiology concerning situation. Gerald Stabs, RN Civil engineer, contracting) at bedside as well.

## 2022-02-13 NOTE — Progress Notes (Signed)
Mobility Specialist Progress Note    02/13/22 1417  Mobility  Activity Contraindicated/medical hold   RN advised d/t pressures. Will f/u as appropriate.   Hildred Alamin Mobility Specialist

## 2022-02-13 NOTE — Progress Notes (Signed)
[place] for Pradaxa Indication: atrial fibrillation  No Known Allergies  Patient Measurements: Weight: 77.2 kg (170 lb 4.8 oz)  Vital Signs: Temp: 97.8 F (36.6 C) (07/07 1135) Temp Source: Oral (07/07 1135) BP: 109/80 (07/07 1135) Pulse Rate: 84 (07/07 1135)  Labs: Recent Labs    02/11/22 0622 02/12/22 0630 02/13/22 0334 02/13/22 0632  HGB 12.9* 13.3  --  12.4*  HCT 39.5 41.2  --  39.3  PLT 153 156  --  138*  APTT  --  106*  --   --   HEPARINUNFRC 0.55 0.43 0.50  --   CREATININE 1.22 1.51*  --  1.27*     Estimated Creatinine Clearance: 43.3 mL/min (A) (by C-G formula based on SCr of 1.27 mg/dL (H)).   Medical History: Past Medical History:  Diagnosis Date   Anal fissure    Arthritis    Atrial fibrillation ([place])    Back pain    Colon polyps    Diabetes mellitus type 2 with complications ([place])    Dysrhythmia    a-fib   GERD (gastroesophageal reflux disease)    History of echocardiogram 07/ 07/ 2011   History of lithotripsy 1989   Hyperlipidemia    Hypertension    Kidney stones    Melanoma ([place]) 05/01/2019   right chest wall (12/20)   Prostate CA ([place]) 2010   Sleep apnea    uses CPAP nightly   Squamous cell carcinoma of skin 05/23/1992   bowens-left parietal scalp ([id number])   Squamous cell carcinoma of skin 03/14/2008   in situ-left upper outer forehead-medial ([id number])   Squamous cell carcinoma of skin 03/14/2008   in situ-crown of scalp ([id number])   Squamous cell carcinoma of skin 04/15/2011   in situ-right dorsal forearm (txpbx)   Squamous cell carcinoma of skin 10/16/2011   in situ-left sideburn   Squamous cell carcinoma of skin 03/11/2015   ka-left sideburn ([id number])   Squamous cell carcinoma of skin 03/11/2015   ka-left forearm ([id number])   Squamous cell carcinoma of skin 06/22/2018   in situ-left forearm, sup (txpbx)   Squamous cell carcinoma of skin 05/01/2019   in situ-mid anterior scalp    Squamous  cell carcinoma of skin 05/01/2019   in situ-right upper arm    Medications:  Medications Prior to Admission  Medication Sig Dispense Refill Last Dose   acetaminophen (TYLENOL) 325 MG tablet Take 2 tablets (650 mg total) by mouth every 6 (six) hours as needed for mild pain (or Fever >/= 101).      acetaminophen (TYLENOL) 650 MG suppository Place 1 suppository (650 mg total) rectally every 6 (six) hours as needed for mild pain (or Fever >/= 101). 12 suppository 0    atorvastatin (LIPITOR) 10 MG tablet Take 10 mg by mouth daily.      budesonide-formoterol (SYMBICORT) 160-4.5 MCG/ACT inhaler Inhale 2 puffs into the lungs 2 (two) times daily. 1 each 3    cetirizine (ZYRTEC) 10 MG tablet TAKE 1 TABLET BY MOUTH EVERY DAY. NOT COVERED BY INSURANCE. 90 tablet 3    diclofenac Sodium (VOLTAREN) 1 % GEL Apply 2 g topically 4 (four) times daily. 50 g 1    finasteride (PROSCAR) 5 MG tablet Take 5 mg by mouth daily.      fluticasone (FLONASE) 50 MCG/ACT nasal spray Place 1 spray into both nostrils daily as needed for allergies or rhinitis.      insulin aspart (NOVOLOG) 100 UNIT/ML injection Inject 0-9  Units into the skin 4 (four) times daily -  before meals and at bedtime. 10 mL 11    linagliptin (TRADJENTA) 5 MG TABS tablet Take 1 tablet (5 mg total) by mouth daily. 90 tablet 3    LORazepam (ATIVAN) 0.5 MG tablet Take 1 tablet (0.5 mg total) by mouth 2 (two) times daily as needed for anxiety. 30 tablet 1    losartan (COZAAR) 25 MG tablet Take 0.5 tablets (12.5 mg total) by mouth daily.      metoprolol tartrate (LOPRESSOR) 50 MG tablet Take 1 tablet (50 mg total) by mouth 3 (three) times daily.      mupirocin ointment (BACTROBAN) 2 % Apply 1 Application topically 2 (two) times daily. 22 g 1    ondansetron (ZOFRAN) 4 MG tablet Take 1 tablet (4 mg total) by mouth every 6 (six) hours as needed for nausea. 20 tablet 0    traZODone (DESYREL) 50 MG tablet Take 0.5 tablets (25 mg total) by mouth at bedtime as needed  for sleep.       Assessment: Patient is a 86yo male on Pradaxa PTA (Last dose of Pradaxa 7/3). Patient now s/p cardiac cath and transferred from [facility] to [facility] for LMCA PCI on heparin drip.   Cath on 7/7 showed calcified left main disease, no PCI. CBC is WNL. No signs or symptoms of bleeding. Will restart home Pradaxa 150 mg twice daily.   Goal of Therapy:  Monitor platelets by anticoagulation protocol: Yes   Plan:  Pradaxa 150 mg twice daily Monitor CBC, BMET, signs/symptoms of bleeding  Louanne Belton PGY1 Pharmacy Resident

## 2022-02-13 NOTE — Care Management Important Message (Signed)
Important Message  Patient Details  Name: DANNIEL TONES MRN: [id number] Date of Birth: [birth year]   Medicare Important Message Given:  Yes     Shelda Altes 02/13/2022, 10:24 AM

## 2022-02-13 NOTE — Progress Notes (Addendum)
Progress Note  Patient Name: Antonio Clark Date of Encounter: 02/13/2022  [facility] Cardiologist: Candee Furbish, MD   Subjective   No chest pain. Planned for cardiac cath today.   Inpatient Medications    Scheduled Meds:  aspirin EC  81 mg Oral Daily   atorvastatin  80 mg Oral Daily   clopidogrel  75 mg Oral Daily   diclofenac Sodium  4 g Topical QID   finasteride  5 mg Oral Daily   insulin aspart  0-5 Units Subcutaneous QHS   insulin aspart  0-9 Units Subcutaneous TID WC   linagliptin  5 mg Oral Daily   loratadine  10 mg Oral Daily   losartan  12.5 mg Oral Daily   metoprolol succinate  150 mg Oral Daily   mupirocin ointment   Nasal BID   sodium chloride flush  3 mL Intravenous Q12H   Continuous Infusions:  sodium chloride     sodium chloride 1 mL/kg/hr (02/13/22 0509)   heparin 1,200 Units/hr (02/12/22 2110)   PRN Meds: sodium chloride, acetaminophen, fluticasone, nitroGLYCERIN, ondansetron (ZOFRAN) IV, sodium chloride flush, traZODone   Vital Signs    Vitals:   02/12/22 1507 02/12/22 2027 02/13/22 0349 02/13/22 0359  BP: (!) 109/92 106/79 119/73   Pulse: 90 98 92   Resp: '18 18 18   '$ Temp: 98 F (36.7 C) 97.8 F (36.6 C) 97.6 F (36.4 C)   TempSrc: Oral Oral Oral   SpO2:  96% 94%   Weight:    77.2 kg    Intake/Output Summary (Last 24 hours) at 02/13/2022 0756 Last data filed at 02/13/2022 0403 Gross per 24 hour  Intake 118.31 ml  Output 400 ml  Net -281.69 ml      02/13/2022    3:59 AM 02/12/2022    4:55 AM 02/11/2022    4:25 AM  Last 3 Weights  Weight (lbs) 170 lb 4.8 oz 163 lb 6.4 oz 172 lb  Weight (kg) 77.248 kg 74.118 kg 78.019 kg      Telemetry    Atrial fib rates 100s - Personally Reviewed  ECG    No new tracing  Physical Exam   GEN: No acute distress.   Neck: No JVD Cardiac: Irreg Irreg, no murmurs, rubs, or gallops.  Respiratory: Clear to auscultation bilaterally. GI: Soft, nontender, non-distended  MS: No edema; No  deformity. Neuro:  Nonfocal  Psych: Normal affect   Labs    High Sensitivity Troponin:   Recent Labs  Lab 02/08/22 1617 02/08/22 1844  TROPONINIHS 34* 35*     Chemistry Recent Labs  Lab 02/10/22 0748 02/11/22 0622 02/12/22 0630 02/13/22 0632  NA 138 140 141 141  K 3.4* 4.1 3.9 4.1  CL 105 106 103 107  CO2 '24 23 27 25  '$ GLUCOSE 121* 143* 133* 122*  BUN 21 29* 30* 27*  CREATININE 1.15 1.22 1.51* 1.27*  CALCIUM 8.9 9.1 9.0 8.9  MG 1.6*  --   --   --   GFRNONAA >60 58* 45* 56*  ANIONGAP '9 11 11 9    '$ Lipids No results for input(s): "CHOL", "TRIG", "HDL", "LABVLDL", "LDLCALC", "CHOLHDL" in the last 168 hours.  Hematology Recent Labs  Lab 02/11/22 0622 02/12/22 0630 02/13/22 0632  WBC 7.2 6.3 6.0  RBC 4.50 4.56 4.32  HGB 12.9* 13.3 12.4*  HCT 39.5 41.2 39.3  MCV 87.8 90.4 91.0  MCH 28.7 29.2 28.7  MCHC 32.7 32.3 31.6  RDW 15.6* 15.5 15.4  PLT 153 156 138*   Thyroid  Recent Labs  Lab 02/09/22 0458  TSH 3.207    BNP Recent Labs  Lab 02/08/22 1627  BNP 1,751.8*    DDimer No results for input(s): "DDIMER" in the last 168 hours.   Radiology    CARDIAC CATHETERIZATION  Result Date: 02/11/2022 Conclusions: Severe ostial/proximal LMCA disease with focal, eccentric 70% stenosis.  There is also 20% proximal/mid LAD stenosis as well as minimal luminal irregularities in the RCA. Mildly elevated left heart filling pressures (PCWP/LVEDP 20 mmHg). Mild pulmonary hypertension (mean PAP 31 mmHg). Mildly reduced Fick cardiac output/index (CO 4.7 L/min, CI 2.4 L/min/m^2). Relatively small left radial artery by ultrasound with significant vasospasm.  Recommend alternative access for PCI. Recommendations: Images reviewed with Dr. Martinique; we will plan to transfer Antonio Clark to [facility] for revascularization of ostial/proximal LMCA stenosis.  The lesion appears well-suited to stenting; I would favor IVUS-guided PCI over CABG in the setting of low Syntax score, advanced age, and  other comorbidities. Continue diuresis and optimization of GDMT for acute HFrEF. Aggressive secondary prevention of coronary artery disease. Nelva Bush, MD [facility]   Cardiac Studies   Cath: 02/11/22   Conclusions: Severe ostial/proximal LMCA disease with focal, eccentric 70% stenosis.  There is also 20% proximal/mid LAD stenosis as well as minimal luminal irregularities in the RCA. Mildly elevated left heart filling pressures (PCWP/LVEDP 20 mmHg). Mild pulmonary hypertension (mean PAP 31 mmHg). Mildly reduced Fick cardiac output/index (CO 4.7 L/min, CI 2.4 L/min/m^2). Relatively small left radial artery by ultrasound with significant vasospasm.  Recommend alternative access for PCI.   Recommendations: Images reviewed with Dr. Martinique; we will plan to transfer Antonio Clark to [facility] for revascularization of ostial/proximal LMCA stenosis.  The lesion appears well-suited to stenting; I would favor IVUS-guided PCI over CABG in the setting of low Syntax score, advanced age, and other comorbidities. Continue diuresis and optimization of GDMT for acute HFrEF. Aggressive secondary prevention of coronary artery disease.   Nelva Bush, MD [facility]   Diagnostic Dominance: Right  Echo: 02/09/22   IMPRESSIONS     1. Left ventricular ejection fraction, by estimation, is 25 to 30%. The  left ventricle has severely decreased function. The left ventricle  demonstrates global hypokinesis. The left ventricular internal cavity size  was mildly dilated. There is moderate  left ventricular hypertrophy. Left ventricular diastolic parameters are  indeterminate.   2. Right ventricular systolic function is normal. The right ventricular  size is normal. There is mildly elevated pulmonary artery systolic  pressure.   3. Left atrial size was moderately dilated.   4. Right atrial size was moderately dilated.   5. The mitral valve is normal in structure. Mild to moderate mitral valve   regurgitation. No evidence of mitral stenosis. Moderate mitral annular  calcification.   6. Tricuspid valve regurgitation is moderate.   7. The aortic valve is normal in structure. Aortic valve regurgitation is  mild. Aortic valve sclerosis/calcification is present, without any  evidence of aortic stenosis.   8. Pulmonic valve regurgitation is moderate.   9. The inferior vena cava is normal in size with <50% respiratory  variability, suggesting right atrial pressure of 8 mmHg.   FINDINGS   Left Ventricle: Left ventricular ejection fraction, by estimation, is 25  to 30%. The left ventricle has severely decreased function. The left  ventricle demonstrates global hypokinesis. The left ventricular internal  cavity size was mildly dilated. There  is moderate left ventricular  hypertrophy. Left ventricular diastolic  parameters are indeterminate.   Right Ventricle: The right ventricular size is normal. No increase in  right ventricular wall thickness. Right ventricular systolic function is  normal. There is mildly elevated pulmonary artery systolic pressure. The  tricuspid regurgitant velocity is 2.99   m/s, and with an assumed right atrial pressure of 8 mmHg, the estimated  right ventricular systolic pressure is 58.8 mmHg.   Left Atrium: Left atrial size was moderately dilated.   Right Atrium: Right atrial size was moderately dilated.   Pericardium: There is no evidence of pericardial effusion.   Mitral Valve: The mitral valve is normal in structure. Moderate mitral  annular calcification. Mild to moderate mitral valve regurgitation. No  evidence of mitral valve stenosis.   Tricuspid Valve: The tricuspid valve is normal in structure. Tricuspid  valve regurgitation is moderate . No evidence of tricuspid stenosis.   Aortic Valve: The aortic valve is normal in structure. Aortic valve  regurgitation is mild. Aortic regurgitation PHT measures 470 msec. Aortic  valve  sclerosis/calcification is present, without any evidence of aortic  stenosis. Aortic valve mean gradient  measures 2.0 mmHg. Aortic valve peak gradient measures 2.5 mmHg. Aortic  valve area, by VTI measures 2.03 cm.   Pulmonic Valve: The pulmonic valve was normal in structure. Pulmonic valve  regurgitation is moderate. No evidence of pulmonic stenosis.   Aorta: The aortic root is normal in size and structure.   Venous: The inferior vena cava is normal in size with less than 50%  respiratory variability, suggesting right atrial pressure of 8 mmHg.   IAS/Shunts: No atrial level shunt detected by color flow Doppler.     Patient Profile     86 y.o. male with persistent A-fib status post ablation in 04/2020 with recurrent A-fib status post DCCV in 05/2020 on Pradaxa, DM2, HTN, HLD, strong family history of CAD, melanoma, prostate cancer, and sleep apnea on CPAP who was admitted to [facility] on 02/08/2022 with  acute HFrEF and A-fib with RVR.  He underwent R/LHC on the afternoon of 02/11/2022 which showed severe ostial left main stenosis with recommendation to transfer to [facility] for revascularization of the ostial/proximal left main.   Assessment & Plan    CAD Elevated Troponin -- Cath 02/11/2022 showed severe ostial/proximal left main stenosis as outlined above with recommendation to transfer to [facility] for revascularization -- remains on IV heparin, ASA, statin, and BB therapy. Received plavix load yesterday, now on '75mg'$  daily -- planned for LM atherectomy today with Dr. Burt Knack   Acute HFrEF: Echo showed LVEF of 25-30% with global hypokinesis, moderate LVH, normal RV function, moderate biatrial enlargement -- continue Toprol-XL with plans to optimize GDMT prior to discharge  -- consider addition of MRA/SGLT2 prior to discharge    Persistent A-fib: Status post prior ablation and DCCV with recurrent A-fib, rates in the low 100s -- Anticoagulation with heparin with recommendation to resume [place] prior  to discharge -- would consider follow-up with EP for consideration of antiarrhythmic therapy versus repeat ablation   HTN: controlled -- continue Toprol    HLD: LDL 38 in 10/2021 -- Atorvastatin '80mg'$  daily    DM2: A1c 6.4 -- SSI -- PTA Tradjenta   AKI: Cr 1.15>>1.22>>1.51>>1.2 -- holding addition IV lasix, hold losartan dose today with plans for cath    For questions or updates, please contact [place] Please consult [email or web address] for contact info under        Signed, Reino Bellis, NP  02/13/2022,  7:56 AM    Patient seen, examined. Available data reviewed. Agree with findings, assessment, and plan as outlined by Reino Bellis, NP.  The patient is independently interviewed and examined.  I have discussed his case with Dr. Martinique and reviewed his cardiac catheterization films.  We reviewed his case in our multidisciplinary heart team meeting this morning.  We all agree that his anatomy is favorable for left main PCI with low syntax score.  I will plan on proceeding with intravascular ultrasound of the left main followed by PTCA and stenting if ultrasound confirms suitable anatomy for this.  If he has heavy concentric calcification, may need to consider scoring balloon angioplasty versus atherectomy.  We will make this decision based on IVUS findings.  The patient has been appropriately loaded with clopidogrel.  Sherren Mocha, M.D. 02/13/2022 9:11 AM

## 2022-02-14 LAB — CBC
HCT: 40.7 % (ref 39.0–52.0)
Hemoglobin: 12.6 g/dL — ABNORMAL LOW (ref 13.0–17.0)
MCH: 28.5 pg (ref 26.0–34.0)
MCHC: 31 g/dL (ref 30.0–36.0)
MCV: 92.1 fL (ref 80.0–100.0)
Platelets: 165 10*3/uL (ref 150–400)
RBC: 4.42 MIL/uL (ref 4.22–5.81)
RDW: 15.9 % — ABNORMAL HIGH (ref 11.5–15.5)
WBC: 8.4 10*3/uL (ref 4.0–10.5)
nRBC: 0 % (ref 0.0–0.2)

## 2022-02-14 LAB — BASIC METABOLIC PANEL
Anion gap: 6 (ref 5–15)
BUN: 31 mg/dL — ABNORMAL HIGH (ref 8–23)
CO2: 23 mmol/L (ref 22–32)
Calcium: 8.9 mg/dL (ref 8.9–10.3)
Chloride: 110 mmol/L (ref 98–111)
Creatinine, Ser: 1.75 mg/dL — ABNORMAL HIGH (ref 0.61–1.24)
GFR, Estimated: 38 mL/min — ABNORMAL LOW (ref >60)
Glucose, Bld: 168 mg/dL — ABNORMAL HIGH (ref 70–99)
Potassium: 5.2 mmol/L — ABNORMAL HIGH (ref 3.5–5.1)
Sodium: 139 mmol/L (ref 135–145)

## 2022-02-14 LAB — GLUCOSE, CAPILLARY
Glucose-Capillary: 187 mg/dL — ABNORMAL HIGH (ref 70–99)
Glucose-Capillary: 169 mg/dL — ABNORMAL HIGH (ref 70–99)
Glucose-Capillary: 178 mg/dL — ABNORMAL HIGH (ref 70–99)
Glucose-Capillary: 165 mg/dL — ABNORMAL HIGH (ref 70–99)

## 2022-02-14 MED ORDER — SODIUM CHLORIDE 0.9 % IV SOLN: INTRAVENOUS | Status: AC

## 2022-02-14 NOTE — Progress Notes (Addendum)
Progress Note  Patient Name: Antonio Clark Date of Encounter: 02/14/2022  [facility] Cardiologist: Candee Furbish, MD   Subjective   I was performed of the left obstructive disease.  No stent placed.  He currently feels anxiety and palpitations but otherwise is without complaint.  Inpatient Medications    Scheduled Meds:  aspirin EC  81 mg Oral Daily   atorvastatin  80 mg Oral Daily   dabigatran  150 mg Oral Q12H   diclofenac Sodium  4 g Topical QID   finasteride  5 mg Oral Daily   insulin aspart  0-5 Units Subcutaneous QHS   insulin aspart  0-9 Units Subcutaneous TID WC   linagliptin  5 mg Oral Daily   loratadine  10 mg Oral Daily   metoprolol succinate  150 mg Oral Daily   mupirocin ointment   Nasal BID   sodium chloride flush  3 mL Intravenous Q12H   sodium chloride flush  3 mL Intravenous Q12H   Continuous Infusions:  sodium chloride     amiodarone 30 mg/hr (02/13/22 2350)   PRN Meds: sodium chloride, acetaminophen, fluticasone, LORazepam, nitroGLYCERIN, ondansetron (ZOFRAN) IV, sodium chloride flush, traZODone   Vital Signs    Vitals:   02/13/22 1850 02/13/22 2046 02/14/22 0245 02/14/22 0315  BP: 95/72 112/82  (!) 111/51  Pulse: 93 88  78  Resp:  19  18  Temp:  (!) 97.4 F (36.3 C)  97.6 F (36.4 C)  TempSrc:  Oral  Oral  SpO2: 100% 91%  93%  Weight:   78.6 kg     Intake/Output Summary (Last 24 hours) at 02/14/2022 1004 Last data filed at 02/14/2022 0316 Gross per 24 hour  Intake 1139.08 ml  Output --  Net 1139.08 ml       02/14/2022    2:45 AM 02/13/2022    3:59 AM 02/12/2022    4:55 AM  Last 3 Weights  Weight (lbs) 173 lb 4.8 oz 170 lb 4.8 oz 163 lb 6.4 oz  Weight (kg) 78.608 kg 77.248 kg 74.118 kg      Telemetry    Atrial fibrillation, rates 90-100-personally reviewed  ECG    Atrial fibrillation, rate 95  Physical Exam   GEN: Well nourished, well developed, in no acute distress  HEENT: normal  Neck: no JVD, carotid bruits, or  masses Cardiac: irregular; no murmurs, rubs, or gallops,no edema  Respiratory:  clear to auscultation bilaterally, normal work of breathing GI: soft, nontender, nondistended, + BS MS: no deformity or atrophy  Skin: warm and dry Neuro:  Strength and sensation are intact Psych: euthymic mood, full affect   Labs    High Sensitivity Troponin:   Recent Labs  Lab 02/08/22 1617 02/08/22 1844  TROPONINIHS 34* 35*      Chemistry Recent Labs  Lab 02/10/22 0748 02/11/22 0622 02/12/22 0630 02/13/22 0632 02/14/22 0258  NA 138   < > 141 141 139  K 3.4*   < > 3.9 4.1 5.2*  CL 105   < > 103 107 110  CO2 24   < > '27 25 23  '$ GLUCOSE 121*   < > 133* 122* 168*  BUN 21   < > 30* 27* 31*  CREATININE 1.15   < > 1.51* 1.27* 1.75*  CALCIUM 8.9   < > 9.0 8.9 8.9  MG 1.6*  --   --   --   --   GFRNONAA >60   < > 45* 56* 38*  ANIONGAP 9   < > '11 9 6   '$ < > = values in this interval not displayed.     Lipids No results for input(s): "CHOL", "TRIG", "HDL", "LABVLDL", "LDLCALC", "CHOLHDL" in the last 168 hours.  Hematology Recent Labs  Lab 02/12/22 0630 02/13/22 0632 02/14/22 0258  WBC 6.3 6.0 8.4  RBC 4.56 4.32 4.42  HGB 13.3 12.4* 12.6*  HCT 41.2 39.3 40.7  MCV 90.4 91.0 92.1  MCH 29.2 28.7 28.5  MCHC 32.3 31.6 31.0  RDW 15.5 15.4 15.9*  PLT 156 138* 165    Thyroid  Recent Labs  Lab 02/09/22 0458  TSH 3.207     BNP Recent Labs  Lab 02/08/22 1627  BNP 1,751.8*     DDimer No results for input(s): "DDIMER" in the last 168 hours.   Radiology    CARDIAC CATHETERIZATION  Result Date: 02/13/2022 Intravascular ultrasound demonstrates moderate eccentric calcific left main stenosis with a minimal lumen area of approximately 12 mm.  PCI is deferred. IVUS findings: There is concentric LAD calcification with no stenosis.  There is mild nonobstructive plaquing in the distal and midportion of the left main.  There is moderate eccentric calcified stenosis at the ostium of the left main  with a minimal lumen area of 12 mm. Recommendations: Discontinue clopidogrel Start amiodarone to control heart rate and facilitate cardioversion in this patient with severe LV dysfunction and atrial fibrillation Consider TEE cardioversion Monday if patient remains in atrial fibrillation Resume heparin in 6 hours   Cardiac Studies   Cath: 02/11/22   Conclusions: Severe ostial/proximal LMCA disease with focal, eccentric 70% stenosis.  There is also 20% proximal/mid LAD stenosis as well as minimal luminal irregularities in the RCA. Mildly elevated left heart filling pressures (PCWP/LVEDP 20 mmHg). Mild pulmonary hypertension (mean PAP 31 mmHg). Mildly reduced Fick cardiac output/index (CO 4.7 L/min, CI 2.4 L/min/m^2). Relatively small left radial artery by ultrasound with significant vasospasm.  Recommend alternative access for PCI.   Recommendations: Images reviewed with Dr. Martinique; we Lanecia Sliva plan to transfer Mr. Lankford to [facility] for revascularization of ostial/proximal LMCA stenosis.  The lesion appears well-suited to stenting; I would favor IVUS-guided PCI over CABG in the setting of low Syntax score, advanced age, and other comorbidities. Continue diuresis and optimization of GDMT for acute HFrEF. Aggressive secondary prevention of coronary artery disease.   Nelva Bush, MD [facility]   Diagnostic Dominance: Right  Echo: 02/09/22   IMPRESSIONS     1. Left ventricular ejection fraction, by estimation, is 25 to 30%. The  left ventricle has severely decreased function. The left ventricle  demonstrates global hypokinesis. The left ventricular internal cavity size  was mildly dilated. There is moderate  left ventricular hypertrophy. Left ventricular diastolic parameters are  indeterminate.   2. Right ventricular systolic function is normal. The right ventricular  size is normal. There is mildly elevated pulmonary artery systolic  pressure.   3. Left atrial size was moderately  dilated.   4. Right atrial size was moderately dilated.   5. The mitral valve is normal in structure. Mild to moderate mitral valve  regurgitation. No evidence of mitral stenosis. Moderate mitral annular  calcification.   6. Tricuspid valve regurgitation is moderate.   7. The aortic valve is normal in structure. Aortic valve regurgitation is  mild. Aortic valve sclerosis/calcification is present, without any  evidence of aortic stenosis.   8. Pulmonic valve regurgitation is moderate.   9. The inferior vena cava is  normal in size with <50% respiratory  variability, suggesting right atrial pressure of 8 mmHg.   FINDINGS   Left Ventricle: Left ventricular ejection fraction, by estimation, is 25  to 30%. The left ventricle has severely decreased function. The left  ventricle demonstrates global hypokinesis. The left ventricular internal  cavity size was mildly dilated. There  is moderate left ventricular hypertrophy. Left ventricular diastolic  parameters are indeterminate.   Right Ventricle: The right ventricular size is normal. No increase in  right ventricular wall thickness. Right ventricular systolic function is  normal. There is mildly elevated pulmonary artery systolic pressure. The  tricuspid regurgitant velocity is 2.99   m/s, and with an assumed right atrial pressure of 8 mmHg, the estimated  right ventricular systolic pressure is 82.9 mmHg.   Left Atrium: Left atrial size was moderately dilated.   Right Atrium: Right atrial size was moderately dilated.   Pericardium: There is no evidence of pericardial effusion.   Mitral Valve: The mitral valve is normal in structure. Moderate mitral  annular calcification. Mild to moderate mitral valve regurgitation. No  evidence of mitral valve stenosis.   Tricuspid Valve: The tricuspid valve is normal in structure. Tricuspid  valve regurgitation is moderate . No evidence of tricuspid stenosis.   Aortic Valve: The aortic valve is  normal in structure. Aortic valve  regurgitation is mild. Aortic regurgitation PHT measures 470 msec. Aortic  valve sclerosis/calcification is present, without any evidence of aortic  stenosis. Aortic valve mean gradient  measures 2.0 mmHg. Aortic valve peak gradient measures 2.5 mmHg. Aortic  valve area, by VTI measures 2.03 cm.   Pulmonic Valve: The pulmonic valve was normal in structure. Pulmonic valve  regurgitation is moderate. No evidence of pulmonic stenosis.   Aorta: The aortic root is normal in size and structure.   Venous: The inferior vena cava is normal in size with less than 50%  respiratory variability, suggesting right atrial pressure of 8 mmHg.   IAS/Shunts: No atrial level shunt detected by color flow Doppler.     Patient Profile     86 y.o. male with persistent A-fib status post ablation in 04/2020 with recurrent A-fib status post DCCV in 05/2020 on Pradaxa, DM2, HTN, HLD, strong family history of CAD, melanoma, prostate cancer, and sleep apnea on CPAP who was admitted to [facility] on 02/08/2022 with  acute HFrEF and A-fib with RVR.  He underwent R/LHC on the afternoon of 02/11/2022 which showed severe ostial left main stenosis with recommendation to transfer to [facility] for revascularization of the ostial/proximal left main.   Assessment & Plan    Unstable angina: Had left main stenosis.  IVUS of the left main showed nonobstructive disease.  Plan for medical management.  Chronic systolic heart failure with reduced ejection fraction: No obvious volume overload.  Persistent atrial fibrillation: Currently on IV amiodarone.  Heart rates in the 90s to 100s, and feeling poorly.  Currently on Pradaxa.  If he does not convert to sinus rhythm, Suraiya Dickerson likely need TEE cardioversion on Monday.  Hypertension: Currently well controlled  Hyperlipidemia: Continue atorvastatin 80 mg daily.  Type 2 diabetes: Continue sliding scale insulin  Acute renal failure: Continue to hold IV Lasix.  We  Donnamaria Shands give gentle hydration as creatinine has gone up postcatheterization.     For questions or updates, please contact [place] Please consult [email or web address] for contact info under        Signed, Wavie Hashimi Meredith Leeds, MD  02/14/2022, 10:04 AM

## 2022-02-15 LAB — GLUCOSE, CAPILLARY
Glucose-Capillary: 144 mg/dL — ABNORMAL HIGH (ref 70–99)
Glucose-Capillary: 204 mg/dL — ABNORMAL HIGH (ref 70–99)
Glucose-Capillary: 190 mg/dL — ABNORMAL HIGH (ref 70–99)
Glucose-Capillary: 150 mg/dL — ABNORMAL HIGH (ref 70–99)

## 2022-02-15 LAB — BASIC METABOLIC PANEL
Anion gap: 13 (ref 5–15)
BUN: 38 mg/dL — ABNORMAL HIGH (ref 8–23)
CO2: 19 mmol/L — ABNORMAL LOW (ref 22–32)
Calcium: 9 mg/dL (ref 8.9–10.3)
Chloride: 104 mmol/L (ref 98–111)
Creatinine, Ser: 1.85 mg/dL — ABNORMAL HIGH (ref 0.61–1.24)
GFR, Estimated: 35 mL/min — ABNORMAL LOW (ref >60)
Glucose, Bld: 150 mg/dL — ABNORMAL HIGH (ref 70–99)
Potassium: 4.8 mmol/L (ref 3.5–5.1)
Sodium: 136 mmol/L (ref 135–145)

## 2022-02-15 LAB — CBC
HCT: 38.5 % — ABNORMAL LOW (ref 39.0–52.0)
Hemoglobin: 12.5 g/dL — ABNORMAL LOW (ref 13.0–17.0)
MCH: 29.1 pg (ref 26.0–34.0)
MCHC: 32.5 g/dL (ref 30.0–36.0)
MCV: 89.7 fL (ref 80.0–100.0)
Platelets: 145 10*3/uL — ABNORMAL LOW (ref 150–400)
RBC: 4.29 MIL/uL (ref 4.22–5.81)
RDW: 15.9 % — ABNORMAL HIGH (ref 11.5–15.5)
WBC: 8.7 10*3/uL (ref 4.0–10.5)
nRBC: 0 % (ref 0.0–0.2)

## 2022-02-15 NOTE — H&P (View-Only) (Signed)
Progress Note  Patient Name: Antonio Clark Date of Encounter: 02/15/2022  [facility] Cardiologist: Candee Furbish, MD   Subjective   Continuing to feel weak and fatigued.  Had anxiety and palpitations.  Had an episode of anxiety with nausea.  Due to his atrial fibrillation, we Janiene Aarons plan for TEE and cardioversion tomorrow.  Inpatient Medications    Scheduled Meds:  aspirin EC  81 mg Oral Daily   atorvastatin  80 mg Oral Daily   dabigatran  150 mg Oral Q12H   diclofenac Sodium  4 g Topical QID   finasteride  5 mg Oral Daily   insulin aspart  0-5 Units Subcutaneous QHS   insulin aspart  0-9 Units Subcutaneous TID WC   linagliptin  5 mg Oral Daily   loratadine  10 mg Oral Daily   metoprolol succinate  150 mg Oral Daily   mupirocin ointment   Nasal BID   sodium chloride flush  3 mL Intravenous Q12H   sodium chloride flush  3 mL Intravenous Q12H   Continuous Infusions:  sodium chloride     amiodarone 30 mg/hr (02/14/22 2250)   PRN Meds: sodium chloride, acetaminophen, fluticasone, LORazepam, nitroGLYCERIN, ondansetron (ZOFRAN) IV, sodium chloride flush, traZODone   Vital Signs    Vitals:   02/14/22 0315 02/14/22 1152 02/14/22 2111 02/15/22 0451  BP: (!) 111/51 126/81 113/70 110/76  Pulse: 78 79 72 82  Resp: '18 20 20 18  '$ Temp: 97.6 F (36.4 C)  (!) 97.4 F (36.3 C) (!) 97.5 F (36.4 C)  TempSrc: Oral  Oral Oral  SpO2: 93% 95% 100% 98%  Weight:    79.9 kg    Intake/Output Summary (Last 24 hours) at 02/15/2022 0819 Last data filed at 02/15/2022 0500 Gross per 24 hour  Intake 989.06 ml  Output 470 ml  Net 519.06 ml       02/15/2022    4:51 AM 02/14/2022    2:45 AM 02/13/2022    3:59 AM  Last 3 Weights  Weight (lbs) 176 lb 3.2 oz 173 lb 4.8 oz 170 lb 4.8 oz  Weight (kg) 79.924 kg 78.608 kg 77.248 kg      Telemetry    Atrial fibrillation - personally reviewed  ECG    None new  Physical Exam   GEN: Well nourished, well developed, in no acute distress   HEENT: normal  Neck: no JVD, carotid bruits, or masses Cardiac: irregular; no murmurs, rubs, or gallops,no edema  Respiratory:  clear to auscultation bilaterally, normal work of breathing GI: soft, nontender, nondistended, + BS MS: no deformity or atrophy  Skin: warm and dry Neuro:  Strength and sensation are intact Psych: euthymic mood, full affect   Labs    High Sensitivity Troponin:   Recent Labs  Lab 02/08/22 1617 02/08/22 1844  TROPONINIHS 34* 35*      Chemistry Recent Labs  Lab 02/10/22 0748 02/11/22 0622 02/13/22 0632 02/14/22 0258 02/15/22 0124  NA 138   < > 141 139 136  K 3.4*   < > 4.1 5.2* 4.8  CL 105   < > 107 110 104  CO2 24   < > 25 23 19*  GLUCOSE 121*   < > 122* 168* 150*  BUN 21   < > 27* 31* 38*  CREATININE 1.15   < > 1.27* 1.75* 1.85*  CALCIUM 8.9   < > 8.9 8.9 9.0  MG 1.6*  --   --   --   --  GFRNONAA >60   < > 56* 38* 35*  ANIONGAP 9   < > '9 6 13   '$ < > = values in this interval not displayed.     Lipids No results for input(s): "CHOL", "TRIG", "HDL", "LABVLDL", "LDLCALC", "CHOLHDL" in the last 168 hours.  Hematology Recent Labs  Lab 02/13/22 0632 02/14/22 0258 02/15/22 0124  WBC 6.0 8.4 8.7  RBC 4.32 4.42 4.29  HGB 12.4* 12.6* 12.5*  HCT 39.3 40.7 38.5*  MCV 91.0 92.1 89.7  MCH 28.7 28.5 29.1  MCHC 31.6 31.0 32.5  RDW 15.4 15.9* 15.9*  PLT 138* 165 145*    Thyroid  Recent Labs  Lab 02/09/22 0458  TSH 3.207     BNP Recent Labs  Lab 02/08/22 1627  BNP 1,751.8*     DDimer No results for input(s): "DDIMER" in the last 168 hours.   Radiology    CARDIAC CATHETERIZATION  Result Date: 02/13/2022 Intravascular ultrasound demonstrates moderate eccentric calcific left main stenosis with a minimal lumen area of approximately 12 mm.  PCI is deferred. IVUS findings: There is concentric LAD calcification with no stenosis.  There is mild nonobstructive plaquing in the distal and midportion of the left main.  There is moderate  eccentric calcified stenosis at the ostium of the left main with a minimal lumen area of 12 mm. Recommendations: Discontinue clopidogrel Start amiodarone to control heart rate and facilitate cardioversion in this patient with severe LV dysfunction and atrial fibrillation Consider TEE cardioversion Monday if patient remains in atrial fibrillation Resume heparin in 6 hours   Cardiac Studies   Cath: 02/11/22   Conclusions: Severe ostial/proximal LMCA disease with focal, eccentric 70% stenosis.  There is also 20% proximal/mid LAD stenosis as well as minimal luminal irregularities in the RCA. Mildly elevated left heart filling pressures (PCWP/LVEDP 20 mmHg). Mild pulmonary hypertension (mean PAP 31 mmHg). Mildly reduced Fick cardiac output/index (CO 4.7 L/min, CI 2.4 L/min/m^2). Relatively small left radial artery by ultrasound with significant vasospasm.  Recommend alternative access for PCI.   Recommendations: Images reviewed with Dr. Martinique; we Clemmie Marxen plan to transfer Mr. Gebert to [facility] for revascularization of ostial/proximal LMCA stenosis.  The lesion appears well-suited to stenting; I would favor IVUS-guided PCI over CABG in the setting of low Syntax score, advanced age, and other comorbidities. Continue diuresis and optimization of GDMT for acute HFrEF. Aggressive secondary prevention of coronary artery disease.   Nelva Bush, MD [facility]   Diagnostic Dominance: Right  Echo: 02/09/22   IMPRESSIONS     1. Left ventricular ejection fraction, by estimation, is 25 to 30%. The  left ventricle has severely decreased function. The left ventricle  demonstrates global hypokinesis. The left ventricular internal cavity size  was mildly dilated. There is moderate  left ventricular hypertrophy. Left ventricular diastolic parameters are  indeterminate.   2. Right ventricular systolic function is normal. The right ventricular  size is normal. There is mildly elevated pulmonary artery  systolic  pressure.   3. Left atrial size was moderately dilated.   4. Right atrial size was moderately dilated.   5. The mitral valve is normal in structure. Mild to moderate mitral valve  regurgitation. No evidence of mitral stenosis. Moderate mitral annular  calcification.   6. Tricuspid valve regurgitation is moderate.   7. The aortic valve is normal in structure. Aortic valve regurgitation is  mild. Aortic valve sclerosis/calcification is present, without any  evidence of aortic stenosis.   8. Pulmonic valve regurgitation  is moderate.   9. The inferior vena cava is normal in size with <50% respiratory  variability, suggesting right atrial pressure of 8 mmHg.   FINDINGS   Left Ventricle: Left ventricular ejection fraction, by estimation, is 25  to 30%. The left ventricle has severely decreased function. The left  ventricle demonstrates global hypokinesis. The left ventricular internal  cavity size was mildly dilated. There  is moderate left ventricular hypertrophy. Left ventricular diastolic  parameters are indeterminate.   Right Ventricle: The right ventricular size is normal. No increase in  right ventricular wall thickness. Right ventricular systolic function is  normal. There is mildly elevated pulmonary artery systolic pressure. The  tricuspid regurgitant velocity is 2.99   m/s, and with an assumed right atrial pressure of 8 mmHg, the estimated  right ventricular systolic pressure is 92.1 mmHg.   Left Atrium: Left atrial size was moderately dilated.   Right Atrium: Right atrial size was moderately dilated.   Pericardium: There is no evidence of pericardial effusion.   Mitral Valve: The mitral valve is normal in structure. Moderate mitral  annular calcification. Mild to moderate mitral valve regurgitation. No  evidence of mitral valve stenosis.   Tricuspid Valve: The tricuspid valve is normal in structure. Tricuspid  valve regurgitation is moderate . No evidence of  tricuspid stenosis.   Aortic Valve: The aortic valve is normal in structure. Aortic valve  regurgitation is mild. Aortic regurgitation PHT measures 470 msec. Aortic  valve sclerosis/calcification is present, without any evidence of aortic  stenosis. Aortic valve mean gradient  measures 2.0 mmHg. Aortic valve peak gradient measures 2.5 mmHg. Aortic  valve area, by VTI measures 2.03 cm.   Pulmonic Valve: The pulmonic valve was normal in structure. Pulmonic valve  regurgitation is moderate. No evidence of pulmonic stenosis.   Aorta: The aortic root is normal in size and structure.   Venous: The inferior vena cava is normal in size with less than 50%  respiratory variability, suggesting right atrial pressure of 8 mmHg.   IAS/Shunts: No atrial level shunt detected by color flow Doppler.     Patient Profile     86 y.o. male with persistent A-fib status post ablation in 04/2020 with recurrent A-fib status post DCCV in 05/2020 on Pradaxa, DM2, HTN, HLD, strong family history of CAD, melanoma, prostate cancer, and sleep apnea on CPAP who was admitted to [facility] on 02/08/2022 with  acute HFrEF and A-fib with RVR.  He underwent R/LHC on the afternoon of 02/11/2022 which showed severe ostial left main stenosis with recommendation to transfer to [facility] for revascularization of the ostial/proximal left main.   Assessment & Plan    1.  Unstable angina: Had a left main stenosis.  IVUS shows no obstructive disease.  Plan for medical management.  2.  Chronic systolic heart failure with reduced ejection fraction: No obvious volume overload.  3.  Persistent atrial fibrillation: Currently on IV amiodarone.  Continues to feel weak and fatigued.  We Falynn Ailey plan for TEE and cardioversion tomorrow.  4.  Hypertension: Currently well controlled  5.  Hyperlipidemia: Continue atorvastatin 80 mg daily.  6.  Type 2 diabetes: Continue sliding scale insulin  7.  Acute renal failure: Continue to hold Lasix.  Creatinine  remains elevated.  Was hydrated after left heart catheterization.  We Jamilla Galli reassess post cardioversion.   For questions or updates, please contact [place] Please consult [email or web address] for contact info under        Signed, Rikita Grabert Meredith Leeds,  MD  02/15/2022, 8:19 AM

## 2022-02-15 NOTE — Progress Notes (Signed)
Progress Note  Patient Name: Antonio Clark Date of Encounter: 02/15/2022  [facility] Cardiologist: Candee Furbish, MD   Subjective   Continuing to feel weak and fatigued.  Had anxiety and palpitations.  Had an episode of anxiety with nausea.  Due to his atrial fibrillation, we Trashawn Oquendo plan for TEE and cardioversion tomorrow.  Inpatient Medications    Scheduled Meds:  aspirin EC  81 mg Oral Daily   atorvastatin  80 mg Oral Daily   dabigatran  150 mg Oral Q12H   diclofenac Sodium  4 g Topical QID   finasteride  5 mg Oral Daily   insulin aspart  0-5 Units Subcutaneous QHS   insulin aspart  0-9 Units Subcutaneous TID WC   linagliptin  5 mg Oral Daily   loratadine  10 mg Oral Daily   metoprolol succinate  150 mg Oral Daily   mupirocin ointment   Nasal BID   sodium chloride flush  3 mL Intravenous Q12H   sodium chloride flush  3 mL Intravenous Q12H   Continuous Infusions:  sodium chloride     amiodarone 30 mg/hr (02/14/22 2250)   PRN Meds: sodium chloride, acetaminophen, fluticasone, LORazepam, nitroGLYCERIN, ondansetron (ZOFRAN) IV, sodium chloride flush, traZODone   Vital Signs    Vitals:   02/14/22 0315 02/14/22 1152 02/14/22 2111 02/15/22 0451  BP: (!) 111/51 126/81 113/70 110/76  Pulse: 78 79 72 82  Resp: '18 20 20 18  '$ Temp: 97.6 F (36.4 C)  (!) 97.4 F (36.3 C) (!) 97.5 F (36.4 C)  TempSrc: Oral  Oral Oral  SpO2: 93% 95% 100% 98%  Weight:    79.9 kg    Intake/Output Summary (Last 24 hours) at 02/15/2022 0819 Last data filed at 02/15/2022 0500 Gross per 24 hour  Intake 989.06 ml  Output 470 ml  Net 519.06 ml       02/15/2022    4:51 AM 02/14/2022    2:45 AM 02/13/2022    3:59 AM  Last 3 Weights  Weight (lbs) 176 lb 3.2 oz 173 lb 4.8 oz 170 lb 4.8 oz  Weight (kg) 79.924 kg 78.608 kg 77.248 kg      Telemetry    Atrial fibrillation - personally reviewed  ECG    None new  Physical Exam   GEN: Well nourished, well developed, in no acute distress   HEENT: normal  Neck: no JVD, carotid bruits, or masses Cardiac: irregular; no murmurs, rubs, or gallops,no edema  Respiratory:  clear to auscultation bilaterally, normal work of breathing GI: soft, nontender, nondistended, + BS MS: no deformity or atrophy  Skin: warm and dry Neuro:  Strength and sensation are intact Psych: euthymic mood, full affect   Labs    High Sensitivity Troponin:   Recent Labs  Lab 02/08/22 1617 02/08/22 1844  TROPONINIHS 34* 35*      Chemistry Recent Labs  Lab 02/10/22 0748 02/11/22 0622 02/13/22 0632 02/14/22 0258 02/15/22 0124  NA 138   < > 141 139 136  K 3.4*   < > 4.1 5.2* 4.8  CL 105   < > 107 110 104  CO2 24   < > 25 23 19*  GLUCOSE 121*   < > 122* 168* 150*  BUN 21   < > 27* 31* 38*  CREATININE 1.15   < > 1.27* 1.75* 1.85*  CALCIUM 8.9   < > 8.9 8.9 9.0  MG 1.6*  --   --   --   --  GFRNONAA >60   < > 56* 38* 35*  ANIONGAP 9   < > '9 6 13   '$ < > = values in this interval not displayed.     Lipids No results for input(s): "CHOL", "TRIG", "HDL", "LABVLDL", "LDLCALC", "CHOLHDL" in the last 168 hours.  Hematology Recent Labs  Lab 02/13/22 0632 02/14/22 0258 02/15/22 0124  WBC 6.0 8.4 8.7  RBC 4.32 4.42 4.29  HGB 12.4* 12.6* 12.5*  HCT 39.3 40.7 38.5*  MCV 91.0 92.1 89.7  MCH 28.7 28.5 29.1  MCHC 31.6 31.0 32.5  RDW 15.4 15.9* 15.9*  PLT 138* 165 145*    Thyroid  Recent Labs  Lab 02/09/22 0458  TSH 3.207     BNP Recent Labs  Lab 02/08/22 1627  BNP 1,751.8*     DDimer No results for input(s): "DDIMER" in the last 168 hours.   Radiology    CARDIAC CATHETERIZATION  Result Date: 02/13/2022 Intravascular ultrasound demonstrates moderate eccentric calcific left main stenosis with a minimal lumen area of approximately 12 mm.  PCI is deferred. IVUS findings: There is concentric LAD calcification with no stenosis.  There is mild nonobstructive plaquing in the distal and midportion of the left main.  There is moderate  eccentric calcified stenosis at the ostium of the left main with a minimal lumen area of 12 mm. Recommendations: Discontinue clopidogrel Start amiodarone to control heart rate and facilitate cardioversion in this patient with severe LV dysfunction and atrial fibrillation Consider TEE cardioversion Monday if patient remains in atrial fibrillation Resume heparin in 6 hours   Cardiac Studies   Cath: 02/11/22   Conclusions: Severe ostial/proximal LMCA disease with focal, eccentric 70% stenosis.  There is also 20% proximal/mid LAD stenosis as well as minimal luminal irregularities in the RCA. Mildly elevated left heart filling pressures (PCWP/LVEDP 20 mmHg). Mild pulmonary hypertension (mean PAP 31 mmHg). Mildly reduced Fick cardiac output/index (CO 4.7 L/min, CI 2.4 L/min/m^2). Relatively small left radial artery by ultrasound with significant vasospasm.  Recommend alternative access for PCI.   Recommendations: Images reviewed with Dr. Martinique; we Diamond Jentz plan to transfer Mr. Coykendall [facility] for revascularization of ostial/proximal LMCA stenosis.  The lesion appears well-suited to stenting; I would favor IVUS-guided PCI over CABG in the setting of low Syntax score, advanced age, and other comorbidities. Continue diuresis and optimization of GDMT for acute HFrEF. Aggressive secondary prevention of coronary artery disease.   Nelva Bush, MD [facility]   Diagnostic Dominance: Right  Echo: 02/09/22   IMPRESSIONS     1. Left ventricular ejection fraction, by estimation, is 25 to 30%. The  left ventricle has severely decreased function. The left ventricle  demonstrates global hypokinesis. The left ventricular internal cavity size  was mildly dilated. There is moderate  left ventricular hypertrophy. Left ventricular diastolic parameters are  indeterminate.   2. Right ventricular systolic function is normal. The right ventricular  size is normal. There is mildly elevated pulmonary artery  systolic  pressure.   3. Left atrial size was moderately dilated.   4. Right atrial size was moderately dilated.   5. The mitral valve is normal in structure. Mild to moderate mitral valve  regurgitation. No evidence of mitral stenosis. Moderate mitral annular  calcification.   6. Tricuspid valve regurgitation is moderate.   7. The aortic valve is normal in structure. Aortic valve regurgitation is  mild. Aortic valve sclerosis/calcification is present, without any  evidence of aortic stenosis.   8. Pulmonic valve regurgitation  is moderate.   9. The inferior vena cava is normal in size with <50% respiratory  variability, suggesting right atrial pressure of 8 mmHg.   FINDINGS   Left Ventricle: Left ventricular ejection fraction, by estimation, is 25  to 30%. The left ventricle has severely decreased function. The left  ventricle demonstrates global hypokinesis. The left ventricular internal  cavity size was mildly dilated. There  is moderate left ventricular hypertrophy. Left ventricular diastolic  parameters are indeterminate.   Right Ventricle: The right ventricular size is normal. No increase in  right ventricular wall thickness. Right ventricular systolic function is  normal. There is mildly elevated pulmonary artery systolic pressure. The  tricuspid regurgitant velocity is 2.99   m/s, and with an assumed right atrial pressure of 8 mmHg, the estimated  right ventricular systolic pressure is 97.6 mmHg.   Left Atrium: Left atrial size was moderately dilated.   Right Atrium: Right atrial size was moderately dilated.   Pericardium: There is no evidence of pericardial effusion.   Mitral Valve: The mitral valve is normal in structure. Moderate mitral  annular calcification. Mild to moderate mitral valve regurgitation. No  evidence of mitral valve stenosis.   Tricuspid Valve: The tricuspid valve is normal in structure. Tricuspid  valve regurgitation is moderate . No evidence of  tricuspid stenosis.   Aortic Valve: The aortic valve is normal in structure. Aortic valve  regurgitation is mild. Aortic regurgitation PHT measures 470 msec. Aortic  valve sclerosis/calcification is present, without any evidence of aortic  stenosis. Aortic valve mean gradient  measures 2.0 mmHg. Aortic valve peak gradient measures 2.5 mmHg. Aortic  valve area, by VTI measures 2.03 cm.   Pulmonic Valve: The pulmonic valve was normal in structure. Pulmonic valve  regurgitation is moderate. No evidence of pulmonic stenosis.   Aorta: The aortic root is normal in size and structure.   Venous: The inferior vena cava is normal in size with less than 50%  respiratory variability, suggesting right atrial pressure of 8 mmHg.   IAS/Shunts: No atrial level shunt detected by color flow Doppler.     Patient Profile     86 y.o. male with persistent A-fib status post ablation in 04/2020 with recurrent A-fib status post DCCV in 05/2020 on Pradaxa, DM2, HTN, HLD, strong family history of CAD, melanoma, prostate cancer, and sleep apnea on CPAP who was admitted to [facility] on 02/08/2022 with  acute HFrEF and A-fib with RVR.  He underwent R/LHC on the afternoon of 02/11/2022 which showed severe ostial left main stenosis with recommendation to transfer to [facility] for revascularization of the ostial/proximal left main.   Assessment & Plan    1.  Unstable angina: Had a left main stenosis.  IVUS shows no obstructive disease.  Plan for medical management.  2.  Chronic systolic heart failure with reduced ejection fraction: No obvious volume overload.  3.  Persistent atrial fibrillation: Currently on IV amiodarone.  Continues to feel weak and fatigued.  We Cleto Claggett plan for TEE and cardioversion tomorrow.  4.  Hypertension: Currently well controlled  5.  Hyperlipidemia: Continue atorvastatin 80 mg daily.  6.  Type 2 diabetes: Continue sliding scale insulin  7.  Acute renal failure: Continue to hold Lasix.  Creatinine  remains elevated.  Was hydrated after left heart catheterization.  We Trystyn Dolley reassess post cardioversion.   For questions or updates, please contact [place] Please consult [email or web address] for contact info under        Signed, Keontre Defino Meredith Leeds,  MD  02/15/2022, 8:19 AM

## 2022-02-16 ENCOUNTER — Encounter (HOSPITAL_COMMUNITY): Payer: Self-pay | Admitting: Cardiovascular Disease

## 2022-02-16 ENCOUNTER — Inpatient Hospital Stay (HOSPITAL_COMMUNITY): Payer: Medicare Other

## 2022-02-16 ENCOUNTER — Telehealth: Payer: Self-pay | Admitting: Dermatology

## 2022-02-16 ENCOUNTER — Other Ambulatory Visit (HOSPITAL_COMMUNITY): Payer: Self-pay

## 2022-02-16 ENCOUNTER — Telehealth (HOSPITAL_COMMUNITY): Payer: Self-pay | Admitting: Pharmacy Technician

## 2022-02-16 LAB — GLUCOSE, CAPILLARY
Glucose-Capillary: 148 mg/dL — ABNORMAL HIGH (ref 70–99)
Glucose-Capillary: 164 mg/dL — ABNORMAL HIGH (ref 70–99)
Glucose-Capillary: 153 mg/dL — ABNORMAL HIGH (ref 70–99)
Glucose-Capillary: 172 mg/dL — ABNORMAL HIGH (ref 70–99)

## 2022-02-16 LAB — PROTIME-INR
INR: 7.1 (ref 0.8–1.2)
Prothrombin Time: 60.3 seconds — ABNORMAL HIGH (ref 11.4–15.2)

## 2022-02-16 LAB — CBC
HCT: 38.6 % — ABNORMAL LOW (ref 39.0–52.0)
Hemoglobin: 12.1 g/dL — ABNORMAL LOW (ref 13.0–17.0)
MCH: 28.7 pg (ref 26.0–34.0)
MCHC: 31.3 g/dL (ref 30.0–36.0)
MCV: 91.5 fL (ref 80.0–100.0)
Platelets: 111 10*3/uL — ABNORMAL LOW (ref 150–400)
RBC: 4.22 MIL/uL (ref 4.22–5.81)
RDW: 15.7 % — ABNORMAL HIGH (ref 11.5–15.5)
WBC: 9.8 10*3/uL (ref 4.0–10.5)
nRBC: 0 % (ref 0.0–0.2)

## 2022-02-16 LAB — BASIC METABOLIC PANEL
Anion gap: 15 (ref 5–15)
Anion gap: 17 — ABNORMAL HIGH (ref 5–15)
BUN: 57 mg/dL — ABNORMAL HIGH (ref 8–23)
BUN: 68 mg/dL — ABNORMAL HIGH (ref 8–23)
CO2: 16 mmol/L — ABNORMAL LOW (ref 22–32)
CO2: 18 mmol/L — ABNORMAL LOW (ref 22–32)
Calcium: 8.6 mg/dL — ABNORMAL LOW (ref 8.9–10.3)
Calcium: 8.8 mg/dL — ABNORMAL LOW (ref 8.9–10.3)
Chloride: 103 mmol/L (ref 98–111)
Chloride: 100 mmol/L (ref 98–111)
Creatinine, Ser: 2.56 mg/dL — ABNORMAL HIGH (ref 0.61–1.24)
Creatinine, Ser: 2.99 mg/dL — ABNORMAL HIGH (ref 0.61–1.24)
GFR, Estimated: 24 mL/min — ABNORMAL LOW (ref >60)
GFR, Estimated: 20 mL/min — ABNORMAL LOW (ref >60)
Glucose, Bld: 157 mg/dL — ABNORMAL HIGH (ref 70–99)
Glucose, Bld: 180 mg/dL — ABNORMAL HIGH (ref 70–99)
Potassium: 6 mmol/L — ABNORMAL HIGH (ref 3.5–5.1)
Potassium: 5.6 mmol/L — ABNORMAL HIGH (ref 3.5–5.1)
Sodium: 134 mmol/L — ABNORMAL LOW (ref 135–145)
Sodium: 135 mmol/L (ref 135–145)

## 2022-02-16 LAB — URINALYSIS, ROUTINE W REFLEX MICROSCOPIC
Bilirubin Urine: NEGATIVE
Glucose, UA: NEGATIVE mg/dL
Hgb urine dipstick: NEGATIVE
Ketones, ur: NEGATIVE mg/dL
Leukocytes,Ua: NEGATIVE
Nitrite: NEGATIVE
Protein, ur: 30 mg/dL — AB
Specific Gravity, Urine: 1.024 (ref 1.005–1.030)
pH: 5 (ref 5.0–8.0)

## 2022-02-16 LAB — CREATININE, URINE, RANDOM: Creatinine, Urine: 181.72 mg/dL

## 2022-02-16 LAB — POTASSIUM: Potassium: 5.4 mmol/L — ABNORMAL HIGH (ref 3.5–5.1)

## 2022-02-16 LAB — SODIUM, URINE, RANDOM: Sodium, Ur: 31 mmol/L

## 2022-02-16 MED ORDER — METOPROLOL SUCCINATE ER 50 MG PO TB24
50.0000 mg | ORAL_TABLET | Freq: Every day | ORAL | Status: DC
  Administered 2022-02-16 – 2022-02-24 (×9): 50 mg via ORAL
  Filled 2022-02-16 (×8): qty 1

## 2022-02-16 MED ORDER — SODIUM ZIRCONIUM CYCLOSILICATE 10 G PO PACK
10.0000 g | PACK | Freq: Once | ORAL | Status: AC
Start: 2022-02-16 — End: 2022-02-16
  Administered 2022-02-16: 10 g via ORAL
  Filled 2022-02-16: qty 1

## 2022-02-16 MED ORDER — SODIUM ZIRCONIUM CYCLOSILICATE 10 G PO PACK
10.0000 g | PACK | Freq: Once | ORAL | Status: AC
  Administered 2022-02-16: 10 g via ORAL
  Filled 2022-02-16: qty 1

## 2022-02-16 MED ORDER — SODIUM CHLORIDE 0.9 % IV SOLN: INTRAVENOUS | Status: DC

## 2022-02-16 MED FILL — Lidocaine HCl Local Preservative Free (PF) Inj 1%: INTRAMUSCULAR | Qty: 30 | Status: AC

## 2022-02-16 MED FILL — Nitroglycerin IV Soln 100 MCG/ML in D5W: INTRA_ARTERIAL | Qty: 10 | Status: AC

## 2022-02-16 NOTE — Telephone Encounter (Signed)
Dina Rich with [facility] left message on office voice mail that she needed to know how long Mr. Antonio Clark is supposed to use Mupirocin.  Ms. Cox's phone number is [phone].

## 2022-02-16 NOTE — Telephone Encounter (Signed)
Phone call to Dina Rich regarding the patient. Unable to leave a message due to Dina Rich working in the hospital and not being available.

## 2022-02-16 NOTE — Consult Note (Signed)
[facility]  INPATIENT CONSULTATION  Reason for Consultation: AKI Requesting Provider: Dr. Martinique  HPI: Antonio Clark is an 86 y.o. male with A fib, type 2 DM, HTN, HL, h/o melanoma, h/o prostate cancer, OSA on CPAP currently admitted since 7/2 for unstable angina now s/p LHC x 2 without need for PCI who is being seen by nephrology for evaluation and management of AKI.    Presented to [facility] 7/2 with CP and underwent [facility] 7/5 with concern for ostial L main stenosis and he was transferred to [facility] for possible PCI.  Had ~62m contrast.   He underwent 2nd LHC 7/7 with no flow limiting stenosis on IVUS so he's being treated medically; 328mcontrast.   Course complicated by recurrent A fib and started on IV amiodarone, plans for cardioversion tomorrow.  Transient Bp in the 90s 7/7.   No NSAIDs.   Home meds including losartan 12.5 daily -not administered here.   He currently complains of mild nausea and weakness but feels he's able to hydrate.  Had an episode of small vol emesis Sat but none yesterday.   Reports decreased urine volume today - denies difficulty voiding or hematuria.  He reported dyspnea to primary today and a CXR shows no evidence of airway disease; on our conversation the dyspnea is reported minor.   PMH: Past Medical History:  Diagnosis Date   Anal fissure    Arthritis    Atrial fibrillation ([facility]   Back pain    Colon polyps    Diabetes mellitus type 2 with complications ([facility]   Dysrhythmia    a-fib   GERD (gastroesophageal reflux disease)    History of echocardiogram 07/ 07/ 2011   History of lithotripsy 1989   Hyperlipidemia    Hypertension    Kidney stones    Melanoma ([facility]09/21/2020   right chest wall (12/20)   Prostate CA ([facility]2010   Sleep apnea    uses CPAP nightly   Squamous cell carcinoma of skin 05/23/1992   bowens-left parietal scalp (CX35FU)   Squamous cell carcinoma of skin 03/14/2008   in situ-left upper outer forehead-medial (CX35FU)    Squamous cell carcinoma of skin 03/14/2008   in situ-crown of scalp (Cx35FU)   Squamous cell carcinoma of skin 04/15/2011   in situ-right dorsal forearm (txpbx)   Squamous cell carcinoma of skin 10/16/2011   in situ-left sideburn   Squamous cell carcinoma of skin 03/11/2015   ka-left sideburn (CX35FU)   Squamous cell carcinoma of skin 03/11/2015   ka-left forearm (CX35FU)   Squamous cell carcinoma of skin 06/22/2018   in situ-left forearm, sup (txpbx)   Squamous cell carcinoma of skin 05/01/2019   in situ-mid anterior scalp    Squamous cell carcinoma of skin 05/01/2019   in situ-right upper arm   PSH: Past Surgical History:  Procedure Laterality Date   ATRIAL FIBRILLATION ABLATION N/A 04/17/2020   Procedure: AT[place] Surgeon: LaVickie EpleyMD;  [facility];  Service: Cardiovascular;  Laterality: N/A;   AXILLARY SENTINEL NODE BIOPSY Right 08/23/2019   Procedure: SENTINEL LYMPH NODE BIOSPY RIGHT AXILLA;  Surgeon: ByStark KleinMD;  Location: [facility] Service: General;  Laterality: Right;   CARDIOVERSION N/A 05/10/2020   Procedure: CARDIOVERSION;  Surgeon: PeFreada BergeronMD;  [facility];  Service: Cardiovascular;  Laterality: N/A;   CARDIOVERSION N/A 05/08/2021   Procedure: CARDIOVERSION;  Surgeon: ScDonato HeinzMD;  Location:  [place] ENDOSCOPY;  Service: Cardiovascular;  Laterality: N/A;   COLONOSCOPY  2002   CORONARY ANGIOGRAPHY N/A 02/13/2022   Procedure: CORONARY ANGIOGRAPHY;  Surgeon: Sherren Mocha, MD;  Location: [place] CV LAB;  Service: Cardiovascular;  Laterality: N/A;   FASCIECTOMY Left 03/28/2019   Procedure: SEGMENTAL FASCIECTOMY LEFT RING FINGER;  Surgeon: Daryll Brod, MD;  Location: [place];  Service: Orthopedics;  Laterality: Left;  ANESTHESIA  AXILLARY BLOCK   INSERTION PROSTATE RADIATION SEED  8 4 2010   per Dr Gwenevere Ghazi ULTRASOUND/IVUS N/A 02/13/2022    Procedure: Intravascular Ultrasound/IVUS;  Surgeon: Sherren Mocha, MD;  Location: [place] CV LAB;  Service: Cardiovascular;  Laterality: N/A;   MELANOMA EXCISION Right 06/22/2019   Procedure: WIDE LOCAL EXCISION RIGHT CHEST WALL MELANOMA, ADVANCEMENT FLAP CLOSURE FOR DEFECT 3X6 CM;  Surgeon: Stark Klein, MD;  Location: [place];  Service: General;  Laterality: Right;   POLYPECTOMY  2002   RIGHT/LEFT HEART CATH AND CORONARY ANGIOGRAPHY N/A 02/11/2022   Procedure: RIGHT/LEFT HEART CATH AND CORONARY ANGIOGRAPHY;  Surgeon: Nelva Bush, MD;  Location: [place] CV LAB;  Service: Cardiovascular;  Laterality: N/A;   THROAT SURGERY  1980   benign cyst   UPPER GASTROINTESTINAL ENDOSCOPY     URETHRAL STRICTURE DILATATION     also penile implant    Past Medical History:  Diagnosis Date   Anal fissure    Arthritis    Atrial fibrillation ([place])    Back pain    Colon polyps    Diabetes mellitus type 2 with complications ([place])    Dysrhythmia    a-fib   GERD (gastroesophageal reflux disease)    History of echocardiogram 07/ 07/ 2011   History of lithotripsy 1989   Hyperlipidemia    Hypertension    Kidney stones    Melanoma ([place]) 05/01/2019   right chest wall (12/20)   Prostate CA ([place]) 2010   Sleep apnea    uses CPAP nightly   Squamous cell carcinoma of skin 05/23/1992   bowens-left parietal scalp (CX35FU)   Squamous cell carcinoma of skin 03/14/2008   in situ-left upper outer forehead-medial (CX35FU)   Squamous cell carcinoma of skin 03/14/2008   in situ-crown of scalp (Cx35FU)   Squamous cell carcinoma of skin 04/15/2011   in situ-right dorsal forearm (txpbx)   Squamous cell carcinoma of skin 10/16/2011   in situ-left sideburn   Squamous cell carcinoma of skin 03/11/2015   ka-left sideburn (CX35FU)   Squamous cell carcinoma of skin 03/11/2015   ka-left forearm (CX35FU)   Squamous cell carcinoma of skin 06/22/2018   in situ-left forearm, sup (txpbx)    Squamous cell carcinoma of skin 05/01/2019   in situ-mid anterior scalp    Squamous cell carcinoma of skin 05/01/2019   in situ-right upper arm    Medications:  I have reviewed the patient's current medications.  Medications Prior to Admission  Medication Sig Dispense Refill   acetaminophen (TYLENOL) 325 MG tablet Take 2 tablets (650 mg total) by mouth every 6 (six) hours as needed for mild pain (or Fever >/= 101).     acetaminophen (TYLENOL) 650 MG suppository Place 1 suppository (650 mg total) rectally every 6 (six) hours as needed for mild pain (or Fever >/= 101). 12 suppository 0   atorvastatin (LIPITOR) 10 MG tablet Take 10 mg by mouth daily.     budesonide-formoterol (SYMBICORT) 160-4.5 MCG/ACT inhaler Inhale 2 puffs into the lungs 2 (two) times daily.  1 each 3   cetirizine (ZYRTEC) 10 MG tablet TAKE 1 TABLET BY MOUTH EVERY DAY. NOT COVERED BY INSURANCE. 90 tablet 3   diclofenac Sodium (VOLTAREN) 1 % GEL Apply 2 g topically 4 (four) times daily. 50 g 1   finasteride (PROSCAR) 5 MG tablet Take 5 mg by mouth daily.     fluticasone (FLONASE) 50 MCG/ACT nasal spray Place 1 spray into both nostrils daily as needed for allergies or rhinitis.     insulin aspart (NOVOLOG) 100 UNIT/ML injection Inject 0-9 Units into the skin 4 (four) times daily -  before meals and at bedtime. 10 mL 11   linagliptin (TRADJENTA) 5 MG TABS tablet Take 1 tablet (5 mg total) by mouth daily. 90 tablet 3   LORazepam (ATIVAN) 0.5 MG tablet Take 1 tablet (0.5 mg total) by mouth 2 (two) times daily as needed for anxiety. 30 tablet 1   losartan (COZAAR) 25 MG tablet Take 0.5 tablets (12.5 mg total) by mouth daily.     metoprolol tartrate (LOPRESSOR) 50 MG tablet Take 1 tablet (50 mg total) by mouth 3 (three) times daily.     mupirocin ointment (BACTROBAN) 2 % Apply 1 Application topically 2 (two) times daily. 22 g 1   ondansetron (ZOFRAN) 4 MG tablet Take 1 tablet (4 mg total) by mouth every 6 (six) hours as needed for  nausea. 20 tablet 0   traZODone (DESYREL) 50 MG tablet Take 0.5 tablets (25 mg total) by mouth at bedtime as needed for sleep.      ALLERGIES:  No Known Allergies  FAM HX: Family History  Problem Relation Age of Onset   Coronary artery disease Brother        3 brothers had CABG   Arrhythmia Brother    Diabetes Brother    Hearing loss Brother    Hypertension Brother    Heart disease Brother    Heart failure Brother    Hypertension Brother    Heart disease Father    Hypertension Father    Sudden death Father    Stroke Mother        cerebral hemorrage   Colon cancer Mother 70       small intestine   Depression Mother    Stroke Sister    Suicidality Sister    Diabetes Brother    Hypertension Brother    Hyperlipidemia Sister    Esophageal cancer Neg Hx    Pancreatic cancer Neg Hx    Prostate cancer Neg Hx    Rectal cancer Neg Hx    Stomach cancer Neg Hx     Social History:   reports that he has never smoked. He has never used smokeless tobacco. He reports that he does not currently use alcohol. He reports that he does not use drugs.  ROS: 12 system ROS neg except per HPI above  Blood pressure 119/64, pulse 71, temperature 98 F (36.7 C), temperature source Oral, resp. rate 16, weight 80.3 kg, SpO2 95 %. PHYSICAL EXAM: Gen: appears well lying in bed  Eyes: anicteric ENT: MMM CV: irreg irreg, reg rate, no rub Abd:  soft, nontender, bladder not palpable Lungs: normal WOB on RA, dec BS bases GU: no foley Extr:  no edema Neuro: nonfocal Skin: no livedo    Results for orders placed or performed during the hospital encounter of 02/11/22 (from the past 48 hour(s))  Glucose, capillary     Status: Abnormal   Collection Time: 02/14/22 11:47 AM  Result  Value Ref Range   Glucose-Capillary 169 (H) 70 - 99 mg/dL    Comment: Glucose reference range applies only to samples taken after fasting for at least 8 hours.  Glucose, capillary     Status: Abnormal   Collection Time:  02/14/22  4:02 PM  Result Value Ref Range   Glucose-Capillary 178 (H) 70 - 99 mg/dL    Comment: Glucose reference range applies only to samples taken after fasting for at least 8 hours.  Glucose, capillary     Status: Abnormal   Collection Time: 02/14/22  9:13 PM  Result Value Ref Range   Glucose-Capillary 165 (H) 70 - 99 mg/dL    Comment: Glucose reference range applies only to samples taken after fasting for at least 8 hours.  CBC     Status: Abnormal   Collection Time: 02/15/22  1:24 AM  Result Value Ref Range   WBC 8.7 4.0 - 10.5 K/uL   RBC 4.29 4.22 - 5.81 MIL/uL   Hemoglobin 12.5 (L) 13.0 - 17.0 g/dL   HCT 38.5 (L) 39.0 - 52.0 %   MCV 89.7 80.0 - 100.0 fL   MCH 29.1 26.0 - 34.0 pg   MCHC 32.5 30.0 - 36.0 g/dL   RDW 15.9 (H) 11.5 - 15.5 %   Platelets 145 (L) 150 - 400 K/uL   nRBC 0.0 0.0 - 0.2 %    Comment: Performed at [facility] [address] [place], [place] [address]  Basic metabolic panel     Status: Abnormal   Collection Time: 02/15/22  1:24 AM  Result Value Ref Range   Sodium 136 135 - 145 mmol/L   Potassium 4.8 3.5 - 5.1 mmol/L   Chloride 104 98 - 111 mmol/L   CO2 19 (L) 22 - 32 mmol/L   Glucose, Bld 150 (H) 70 - 99 mg/dL    Comment: Glucose reference range applies only to samples taken after fasting for at least 8 hours.   BUN 38 (H) 8 - 23 mg/dL   Creatinine, Ser 1.85 (H) 0.61 - 1.24 mg/dL   Calcium 9.0 8.9 - 10.3 mg/dL   GFR, Estimated 35 (L) >60 mL/min    Comment: (NOTE) Calculated using the CKD-EPI Creatinine Equation (2021)    Anion gap 13 5 - 15    Comment: Performed at [facility] [address], [place], [place] [address]  Glucose, capillary     Status: Abnormal   Collection Time: 02/15/22  7:34 AM  Result Value Ref Range   Glucose-Capillary 144 (H) 70 - 99 mg/dL    Comment: Glucose reference range applies only to samples taken after fasting for at least 8 hours.  Glucose, capillary     Status: Abnormal   Collection Time:  02/15/22 12:56 PM  Result Value Ref Range   Glucose-Capillary 204 (H) 70 - 99 mg/dL    Comment: Glucose reference range applies only to samples taken after fasting for at least 8 hours.  Glucose, capillary     Status: Abnormal   Collection Time: 02/15/22  3:56 PM  Result Value Ref Range   Glucose-Capillary 190 (H) 70 - 99 mg/dL    Comment: Glucose reference range applies only to samples taken after fasting for at least 8 hours.  Glucose, capillary     Status: Abnormal   Collection Time: 02/15/22  9:24 PM  Result Value Ref Range   Glucose-Capillary 150 (H) 70 - 99 mg/dL    Comment: Glucose reference range  applies only to samples taken after fasting for at least 8 hours.  CBC     Status: Abnormal   Collection Time: 02/16/22  1:20 AM  Result Value Ref Range   WBC 9.8 4.0 - 10.5 K/uL   RBC 4.22 4.22 - 5.81 MIL/uL   Hemoglobin 12.1 (L) 13.0 - 17.0 g/dL   HCT 38.6 (L) 39.0 - 52.0 %   MCV 91.5 80.0 - 100.0 fL   MCH 28.7 26.0 - 34.0 pg   MCHC 31.3 30.0 - 36.0 g/dL   RDW 15.7 (H) 11.5 - 15.5 %   Platelets 111 (L) 150 - 400 K/uL   nRBC 0.0 0.0 - 0.2 %    Comment: Performed at [facility] [address], [place], [place] [address]  Basic metabolic panel     Status: Abnormal   Collection Time: 02/16/22  1:20 AM  Result Value Ref Range   Sodium 134 (L) 135 - 145 mmol/L   Potassium 6.0 (H) 3.5 - 5.1 mmol/L    Comment: HEMOLYSIS AT THIS LEVEL MAY AFFECT RESULT DELTA CHECK NOTED    Chloride 103 98 - 111 mmol/L   CO2 16 (L) 22 - 32 mmol/L   Glucose, Bld 157 (H) 70 - 99 mg/dL    Comment: Glucose reference range applies only to samples taken after fasting for at least 8 hours.   BUN 57 (H) 8 - 23 mg/dL   Creatinine, Ser 2.56 (H) 0.61 - 1.24 mg/dL   Calcium 8.6 (L) 8.9 - 10.3 mg/dL   GFR, Estimated 24 (L) >60 mL/min    Comment: (NOTE) Calculated using the CKD-EPI Creatinine Equation (2021)    Anion gap 15 5 - 15    Comment: Performed at [facility] [address],  [place], [place] [address]  Potassium     Status: Abnormal   Collection Time: 02/16/22  4:41 AM  Result Value Ref Range   Potassium 5.4 (H) 3.5 - 5.1 mmol/L    Comment: Performed at [facility] [address] [place], [place] [address]  Glucose, capillary     Status: Abnormal   Collection Time: 02/16/22  7:52 AM  Result Value Ref Range   Glucose-Capillary 148 (H) 70 - 99 mg/dL    Comment: Glucose reference range applies only to samples taken after fasting for at least 8 hours.    DG Chest 2 View  Result Date: 02/16/2022 CLINICAL DATA:  Shortness of breath. EXAM: CHEST - 2 VIEW COMPARISON:  02/08/2022 FINDINGS: Stable cardiomegaly. Previously seen interstitial infiltrates have resolved. Mild scarring again noted in both upper lobes. Lungs are otherwise clear. Surgical clips again seen in the right lateral chest wall soft tissues. IMPRESSION: Stable cardiomegaly. No active lung disease. Electronically Signed   By: Marlaine Hind M.D.   On: 02/16/2022 10:18    Assessment/Plan: Antonio Clark is an 86 y.o. male with A fib, type 2 DM, HTN, HL, h/o melanoma, h/o prostate cancer, OSA on CPAP currently admitted since 7/2 for unstable angina now s/p LHC x 2 without need for PCI who is being seen by nephrology for evaluation and management of AKI.    **AKI:  normal baseline renal function now with AKI Cr 2.6 following LHC x 2 and transient hypotension.   Suspect multifactorial.  R/o obstruction with renal US, r/o GN with UA.   He appears euvolemic currently and I don't think volume expansion would be helpful at this time.  Will follow closely  with daily labs, I/Os.  Avoid nephrotoxins and hypovolemia.    **Hyperkalemia:  Rec'd lokelma x 1 dose today.  Trend.  **Metabolic acidosis: secondary to AKI, follow and if worse start po bicarb  **CAD, unstable angina, HFrEF 25-30%:  being medically managed by cardiology  **Atrial fibrillation:  plans for DCCV tomorrow, per cardiology.     Will follow, page  with questions.   Justin Mend 02/16/2022, 11:37 AM

## 2022-02-16 NOTE — Anesthesia Preprocedure Evaluation (Addendum)
Anesthesia Evaluation  Patient identified by MRN, date of birth, ID band Patient awake    Reviewed: Allergy & Precautions, NPO status , Patient's Chart, lab work & pertinent test results  Airway Mallampati: II  TM Distance: >3 FB Neck ROM: Full    Dental no notable dental hx. (+) Teeth Intact   Pulmonary sleep apnea and Continuous Positive Airway Pressure Ventilation ,    Pulmonary exam normal breath sounds clear to auscultation       Cardiovascular hypertension, + CAD  Normal cardiovascular exam+ dysrhythmias Atrial Fibrillation  Rhythm:Irregular Rate:Normal     Neuro/Psych    GI/Hepatic GERD  ,  Endo/Other  diabetes, Type 2  Renal/GU Renal InsufficiencyRenal diseaseLab Results      Component                Value               Date                      CREATININE               2.99 (H)            02/16/2022              K                        5.6 (H)             02/16/2022                 Prostate CA    Musculoskeletal  (+) Arthritis ,   Abdominal   Peds  Hematology Lab Results      Component                Value               Date                     HGB                      12.1 (L)            02/16/2022                HCT                      38.6 (L)            02/16/2022                PLT                      111 (L)             02/16/2022              Anesthesia Other Findings   Reproductive/Obstetrics                            Anesthesia Physical Anesthesia Plan  ASA: 3  Anesthesia Plan: MAC   Post-op Pain Management:    Induction:   PONV Risk Score and Plan: Treatment may vary due to age or medical condition  Airway Management Planned: Natural Airway and Nasal Cannula  Additional Equipment: None  Intra-op Plan:   Post-operative Plan:   Informed Consent: I  have reviewed the patients History and Physical, chart, labs and discussed the procedure including the  risks, benefits and alternatives for the proposed anesthesia with the patient or authorized representative who has indicated his/her understanding and acceptance.     Dental advisory given  Plan Discussed with:   Anesthesia Plan Comments: (TEE Cardioversion)       Anesthesia Quick Evaluation

## 2022-02-16 NOTE — Progress Notes (Signed)
Seen pt from [id number] pt declined ambulation due to feeing unwell. Pt caregiver was present during education. Caregiver was given HF booklet, low NA, heart healthy, and diabetic nutrition sheet, ex guidelines, NTG use, and restrictions. All questions were answered prior to leaving.   Antonio Clark 02/16/2022 3:09 PM

## 2022-02-16 NOTE — Telephone Encounter (Signed)
Pharmacy Patient Advocate Encounter  Insurance verification completed.    The patient is insured through [employer] Part D   The patient is currently admitted and ran test claims for the following: Dabigatran Etexilate 150 mg.  Copays and coinsurance results were relayed to Inpatient clinical team.

## 2022-02-16 NOTE — TOC Initial Note (Signed)
Transition of Care [facility]) - Initial/Assessment Note    Patient Details  Name: Antonio Clark MRN: [id number] Date of Birth: 11-23-1935  [facility] CM/SW Contact:    Bethena Roys, RN Phone Number: 02/16/2022, 3:14 PM  Clinical Narrative:  Risk for readmission assessment completed. PTA patient was from home with caregiver [name]. Per [name], patient has DME cane and rolling walker in the home. [name] takes the patient to appointments and gets medications without any issues. Patient will benefit from PT/OT consult for recommendations. Case Manager will continue to follow for additional transition of care needs.              Expected Discharge Plan: [place] Barriers to Discharge: Continued Medical Work up   Patient Goals and CMS Choice Patient states their goals for this hospitalization and ongoing recovery are:: plan to return home      Expected Discharge Plan and Services Expected Discharge Plan: [place] In-house Referral: NA Discharge Planning Services: CM Consult Post Acute Care Choice: [place] arrangements for the past 2 months: Single Family Home                   DME Agency: NA     Prior Living Arrangements/Services Living arrangements for the past 2 months: Single Family Home Lives with:: Other (Comment) (Caregiver Kim) Patient language and need for interpreter reviewed:: Yes Do you feel safe going back to the place where you live?: Yes      Need for Family Participation in Patient Care: Yes (Comment) Care giver support system in place?: Yes (comment)   Criminal Activity/Legal Involvement Pertinent to Current Situation/Hospitalization: No - Comment as needed  Activities of Daily Living Home Assistive Devices/Equipment: None ADL Screening (condition at time of admission) Patient's cognitive ability adequate to safely complete daily activities?: Yes Is the patient deaf or have difficulty hearing?:  No Does the patient have difficulty seeing, even when wearing glasses/contacts?: No Does the patient have difficulty concentrating, remembering, or making decisions?: No Patient able to express need for assistance with ADLs?: Yes Does the patient have difficulty dressing or bathing?: No Independently performs ADLs?: Yes (appropriate for developmental age) Does the patient have difficulty walking or climbing stairs?: No Weakness of Legs: None Weakness of Arms/Hands: None  Permission Sought/Granted Permission sought to share information with : Family Supports, Case Manager      Emotional Assessment Appearance:: Appears stated age Attitude/Demeanor/Rapport: Engaged Affect (typically observed): Appropriate   Alcohol / Substance Use: Not Applicable Psych Involvement: No (comment)  Admission diagnosis:  Unstable angina ([place]) [I20.0] Patient Active Problem List   Diagnosis Date Noted   Unstable angina ([place]) 02/11/2022   Acute HFrEF (heart failure with reduced ejection fraction) ([place])    Paroxysmal atrial fibrillation with rapid ventricular response ([place]) 02/09/2022   Acute CHF (congestive heart failure) ([place]) 02/08/2022   Type 2 diabetes mellitus with complication, without long-term current use of insulin ([place]) 02/08/2022   Hypertensive urgency 02/08/2022   Arthritis of hand 09/02/2021   Olecranon bursitis 08/18/2021   Pain in joint of right elbow 08/14/2021   Imbalance 08/06/2021   Pain in joint of right hip 07/22/2021   Low back pain 07/22/2021   Fracture of sacrum ([place]) 07/14/2021   Orthostatic hypotension 05/02/2021   Melanoma ([place])    Malignant neoplasm of prostate ([place]) 07/15/2015   Recurrent nephrolithiasis 07/15/2015   Personal history of prostate cancer 04/25/2015   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/09/2013  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

## 2022-02-16 NOTE — Progress Notes (Addendum)
The patient has been seen in conjunction with Harlan Stains, NP. All aspects of care have been considered and discussed. The patient has been personally interviewed, examined, and all clinical data has been reviewed.  Not having angina.  Angiogram 50% that intravascular ultrasound greater than 12 mm minimal luminal diameter.  Still concerned about tangential imaging in the ostium of the left main may be causing misleading results. Current plan is to manage atrial fibrillation with TEE guided cardioversion. Coronary disease will be medical   Progress Note  Patient Name: Antonio Clark Date of Encounter: 02/16/2022  [facility] Cardiologist: Candee Furbish, MD   Subjective   No complaints other than lack of sleep.  Inpatient Medications    Scheduled Meds:  aspirin EC  81 mg Oral Daily   atorvastatin  80 mg Oral Daily   dabigatran  150 mg Oral Q12H   diclofenac Sodium  4 g Topical QID   finasteride  5 mg Oral Daily   insulin aspart  0-5 Units Subcutaneous QHS   insulin aspart  0-9 Units Subcutaneous TID WC   linagliptin  5 mg Oral Daily   loratadine  10 mg Oral Daily   metoprolol succinate  50 mg Oral Daily   mupirocin ointment   Nasal BID   sodium chloride flush  3 mL Intravenous Q12H   sodium chloride flush  3 mL Intravenous Q12H   sodium zirconium cyclosilicate  10 g Oral Once   Continuous Infusions:  sodium chloride     amiodarone 30 mg/hr (02/15/22 2202)   PRN Meds: sodium chloride, acetaminophen, fluticasone, LORazepam, nitroGLYCERIN, ondansetron (ZOFRAN) IV, sodium chloride flush, traZODone   Vital Signs    Vitals:   02/15/22 0800 02/15/22 1318 02/15/22 2045 02/16/22 0459  BP:  109/77 (!) 120/94 119/64  Pulse: 86 80 78 71  Resp:  (!) '21 20 16  '$ Temp:  (!) 97.5 F (36.4 C) 97.9 F (36.6 C) 98 F (36.7 C)  TempSrc:  Oral Oral Oral  SpO2: 96% 95%    Weight:    80.3 kg    Intake/Output Summary (Last 24 hours) at 02/16/2022 0858 Last data filed at  02/16/2022 0640 Gross per 24 hour  Intake --  Output 100 ml  Net -100 ml      02/16/2022    4:59 AM 02/15/2022    4:51 AM 02/14/2022    2:45 AM  Last 3 Weights  Weight (lbs) 177 lb 176 lb 3.2 oz 173 lb 4.8 oz  Weight (kg) 80.287 kg 79.924 kg 78.608 kg      Telemetry    Atrial fibrillation, rates 70-80- Personally Reviewed  ECG    Atrial fibrillation, 71 bpm, prolonged QT 451- Personally Reviewed  Physical Exam   GEN: No acute distress.   Neck: No JVD Cardiac: Irregularly irregular, no murmurs, rubs, or gallops.  Respiratory: Crackles in the right lower lung GI: Soft, nontender, non-distended  MS: No edema; No deformity. Neuro:  Nonfocal  Psych: Normal affect   Labs    High Sensitivity Troponin:   Recent Labs  Lab 02/08/22 1617 02/08/22 1844  TROPONINIHS 34* 35*     Chemistry Recent Labs  Lab 02/10/22 0748 02/11/22 0622 02/14/22 0258 02/15/22 0124 02/16/22 0120 02/16/22 0441  NA 138   < > 139 136 134*  --   K 3.4*   < > 5.2* 4.8 6.0* 5.4*  CL 105   < > 110 104 103  --   CO2 24   < >  23 19* 16*  --   GLUCOSE 121*   < > 168* 150* 157*  --   BUN 21   < > 31* 38* 57*  --   CREATININE 1.15   < > 1.75* 1.85* 2.56*  --   CALCIUM 8.9   < > 8.9 9.0 8.6*  --   MG 1.6*  --   --   --   --   --   GFRNONAA >60   < > 38* 35* 24*  --   ANIONGAP 9   < > '6 13 15  '$ --    < > = values in this interval not displayed.    Lipids No results for input(s): "CHOL", "TRIG", "HDL", "LABVLDL", "LDLCALC", "CHOLHDL" in the last 168 hours.  Hematology Recent Labs  Lab 02/14/22 0258 02/15/22 0124 02/16/22 0120  WBC 8.4 8.7 9.8  RBC 4.42 4.29 4.22  HGB 12.6* 12.5* 12.1*  HCT 40.7 38.5* 38.6*  MCV 92.1 89.7 91.5  MCH 28.5 29.1 28.7  MCHC 31.0 32.5 31.3  RDW 15.9* 15.9* 15.7*  PLT 165 145* 111*   Thyroid No results for input(s): "TSH", "FREET4" in the last 168 hours.  BNPNo results for input(s): "BNP", "PROBNP" in the last 168 hours.  DDimer No results for input(s): "DDIMER"  in the last 168 hours.   Radiology    No results found.  Cardiac Studies   Cath: 02/11/22   Conclusions: Severe ostial/proximal LMCA disease with focal, eccentric 70% stenosis.  There is also 20% proximal/mid LAD stenosis as well as minimal luminal irregularities in the RCA. Mildly elevated left heart filling pressures (PCWP/LVEDP 20 mmHg). Mild pulmonary hypertension (mean PAP 31 mmHg). Mildly reduced Fick cardiac output/index (CO 4.7 L/min, CI 2.4 L/min/m^2). Relatively small left radial artery by ultrasound with significant vasospasm.  Recommend alternative access for PCI.   Recommendations: Images reviewed with Dr. Martinique; we will plan to transfer [name] to [facility] for revascularization of ostial/proximal LMCA stenosis.  The lesion appears well-suited to stenting; I would favor IVUS-guided PCI over CABG in the setting of low Syntax score, advanced age, and other comorbidities. Continue diuresis and optimization of GDMT for acute HFrEF. Aggressive secondary prevention of coronary artery disease.   Nelva Bush, MD [facility]   Diagnostic Dominance: Right  Echo: 02/09/22   IMPRESSIONS     1. Left ventricular ejection fraction, by estimation, is 25 to 30%. The  left ventricle has severely decreased function. The left ventricle  demonstrates global hypokinesis. The left ventricular internal cavity size  was mildly dilated. There is moderate  left ventricular hypertrophy. Left ventricular diastolic parameters are  indeterminate.   2. Right ventricular systolic function is normal. The right ventricular  size is normal. There is mildly elevated pulmonary artery systolic  pressure.   3. Left atrial size was moderately dilated.   4. Right atrial size was moderately dilated.   5. The mitral valve is normal in structure. Mild to moderate mitral valve  regurgitation. No evidence of mitral stenosis. Moderate mitral annular  calcification.   6. Tricuspid valve  regurgitation is moderate.   7. The aortic valve is normal in structure. Aortic valve regurgitation is  mild. Aortic valve sclerosis/calcification is present, without any  evidence of aortic stenosis.   8. Pulmonic valve regurgitation is moderate.   9. The inferior vena cava is normal in size with <50% respiratory  variability, suggesting right atrial pressure of 8 mmHg.   FINDINGS   Left Ventricle: Left  ventricular ejection fraction, by estimation, is 25  to 30%. The left ventricle has severely decreased function. The left  ventricle demonstrates global hypokinesis. The left ventricular internal  cavity size was mildly dilated. There  is moderate left ventricular hypertrophy. Left ventricular diastolic  parameters are indeterminate.   Right Ventricle: The right ventricular size is normal. No increase in  right ventricular wall thickness. Right ventricular systolic function is  normal. There is mildly elevated pulmonary artery systolic pressure. The  tricuspid regurgitant velocity is 2.99   m/s, and with an assumed right atrial pressure of 8 mmHg, the estimated  right ventricular systolic pressure is 29.9 mmHg.   Left Atrium: Left atrial size was moderately dilated.   Right Atrium: Right atrial size was moderately dilated.   Pericardium: There is no evidence of pericardial effusion.   Mitral Valve: The mitral valve is normal in structure. Moderate mitral  annular calcification. Mild to moderate mitral valve regurgitation. No  evidence of mitral valve stenosis.   Tricuspid Valve: The tricuspid valve is normal in structure. Tricuspid  valve regurgitation is moderate . No evidence of tricuspid stenosis.   Aortic Valve: The aortic valve is normal in structure. Aortic valve  regurgitation is mild. Aortic regurgitation PHT measures 470 msec. Aortic  valve sclerosis/calcification is present, without any evidence of aortic  stenosis. Aortic valve mean gradient  measures 2.0 mmHg.  Aortic valve peak gradient measures 2.5 mmHg. Aortic  valve area, by VTI measures 2.03 cm.   Pulmonic Valve: The pulmonic valve was normal in structure. Pulmonic valve  regurgitation is moderate. No evidence of pulmonic stenosis.   Aorta: The aortic root is normal in size and structure.   Venous: The inferior vena cava is normal in size with less than 50%  respiratory variability, suggesting right atrial pressure of 8 mmHg.   IAS/Shunts: No atrial level shunt detected by color flow Doppler.   Cath: 02/13/22  Intravascular ultrasound demonstrates moderate eccentric calcific left main stenosis with a minimal lumen area of approximately 12 mm.  PCI is deferred.   IVUS findings: There is concentric LAD calcification with no stenosis.  There is mild nonobstructive plaquing in the distal and midportion of the left main.  There is moderate eccentric calcified stenosis at the ostium of the left main with a minimal lumen area of 12 mm.   Recommendations:  Discontinue clopidogrel Start amiodarone to control heart rate and facilitate cardioversion in this patient with severe LV dysfunction and atrial fibrillation Consider TEE cardioversion Monday if patient remains in atrial fibrillation Resume heparin in 6 hours  Patient Profile     86 y.o. male  persistent A-fib status post ablation in 04/2020 with recurrent A-fib status post DCCV in 05/2020 on Pradaxa, DM2, HTN, HLD, strong family history of CAD, melanoma, prostate cancer, and sleep apnea on CPAP who was admitted to [facility] on 02/08/2022 with  acute HFrEF and A-fib with RVR.  He underwent R/LHC on the afternoon of 02/11/2022 which showed severe ostial left main stenosis with recommendation to transfer to [facility] for revascularization of the ostial/proximal left main.   Assessment & Plan    Unstable angina: Underwent cardiac catheterization at [facility] and found to have 70% left main stenosis.  Transferred to [facility] for further management with potential PCI.   Underwent cardiac catheterization on 7/7 with IVUS revealing no obstructive disease.  Recommendations for further medical management.  HFrEF NICM --LVEF of 25 to 30% global hypokinesis, normal RV, moderate biatrial enlargement, moderate TR. Initially noted  to be volume overloaded and diuresed prior to transfer.  Lasix has been held since 7/6, losartan also held.  Does have crackles in right lower lung field, check chest x-ray --Reduce metoprolol XL from 150 mg daily to 50 mg daily --Unable to further titrate GDMT in the setting of AKI  Persistent atrial fibrillation: Seen by EP, Dr. Curt Bears over the weekend and started on IV amio.  Plan for TEE/DCCV, first available slot tomorrow at 8 AM --Has been transitioned from IV heparin back to Pradaxa  Hyperlipidemia: Atorvastatin 80 mg daily  Acute renal failure: Cr on admission now up to 2.56 despite holding medications and IV fluids, in the setting of contrast use. --Nephrology consult appreciated  Hyperkalemia: K of 6, repeat 5.4 --Lokelma 10 mg x 1 --BMET this afternoon  Diabetes: Hemoglobin A1c 6.4 --SSI, Tradjenta  For questions or updates, please contact [place] Please consult [email or web address] for contact info under    Signed, Reino Bellis, NP  02/16/2022, 8:58 AM

## 2022-02-17 ENCOUNTER — Inpatient Hospital Stay (HOSPITAL_COMMUNITY): Payer: Medicare Other | Admitting: Anesthesiology

## 2022-02-17 ENCOUNTER — Encounter (HOSPITAL_COMMUNITY)
Admission: AD | Disposition: A | Discharge status: Home Health | Payer: Self-pay | Source: Other Acute Inpatient Hospital | Attending: Cardiology

## 2022-02-17 ENCOUNTER — Other Ambulatory Visit: Payer: Self-pay

## 2022-02-17 ENCOUNTER — Inpatient Hospital Stay (HOSPITAL_COMMUNITY)
Admission: AD | Admit: 2022-02-17 | Discharge: 2022-02-17 | Disposition: A | Discharge status: Home / Self Care | Payer: Medicare Other | Source: Other Acute Inpatient Hospital | Attending: Cardiology | Admitting: Cardiology

## 2022-02-17 ENCOUNTER — Encounter (HOSPITAL_COMMUNITY): Payer: Self-pay | Admitting: Cardiology

## 2022-02-17 DIAGNOSIS — I4891 Unspecified atrial fibrillation: Secondary | ICD-10-CM

## 2022-02-17 DIAGNOSIS — I361 Nonrheumatic tricuspid (valve) insufficiency: Secondary | ICD-10-CM

## 2022-02-17 DIAGNOSIS — I2510 Atherosclerotic heart disease of native coronary artery without angina pectoris: Secondary | ICD-10-CM

## 2022-02-17 DIAGNOSIS — I429 Cardiomyopathy, unspecified: Secondary | ICD-10-CM

## 2022-02-17 DIAGNOSIS — I340 Nonrheumatic mitral (valve) insufficiency: Secondary | ICD-10-CM

## 2022-02-17 DIAGNOSIS — I083 Combined rheumatic disorders of mitral, aortic and tricuspid valves: Secondary | ICD-10-CM | POA: Diagnosis not present

## 2022-02-17 DIAGNOSIS — I5023 Acute on chronic systolic (congestive) heart failure: Secondary | ICD-10-CM

## 2022-02-17 DIAGNOSIS — N179 Acute kidney failure, unspecified: Secondary | ICD-10-CM

## 2022-02-17 HISTORY — PX: CARDIOVERSION: SHX1299

## 2022-02-17 HISTORY — PX: TEE WITHOUT CARDIOVERSION: SHX5443

## 2022-02-17 LAB — CBC
HCT: 34.3 % — ABNORMAL LOW (ref 39.0–52.0)
Hemoglobin: 11.2 g/dL — ABNORMAL LOW (ref 13.0–17.0)
MCH: 28.9 pg (ref 26.0–34.0)
MCHC: 32.7 g/dL (ref 30.0–36.0)
MCV: 88.4 fL (ref 80.0–100.0)
Platelets: 67 10*3/uL — ABNORMAL LOW (ref 150–400)
RBC: 3.88 MIL/uL — ABNORMAL LOW (ref 4.22–5.81)
RDW: 15.7 % — ABNORMAL HIGH (ref 11.5–15.5)
WBC: 6.6 10*3/uL (ref 4.0–10.5)
nRBC: 0 % (ref 0.0–0.2)

## 2022-02-17 LAB — RENAL FUNCTION PANEL
Albumin: 2.9 g/dL — ABNORMAL LOW (ref 3.5–5.0)
Anion gap: 13 (ref 5–15)
BUN: 70 mg/dL — ABNORMAL HIGH (ref 8–23)
CO2: 18 mmol/L — ABNORMAL LOW (ref 22–32)
Calcium: 8 mg/dL — ABNORMAL LOW (ref 8.9–10.3)
Chloride: 104 mmol/L (ref 98–111)
Creatinine, Ser: 2.59 mg/dL — ABNORMAL HIGH (ref 0.61–1.24)
GFR, Estimated: 24 mL/min — ABNORMAL LOW (ref >60)
Glucose, Bld: 128 mg/dL — ABNORMAL HIGH (ref 70–99)
Phosphorus: 5.3 mg/dL — ABNORMAL HIGH (ref 2.5–4.6)
Potassium: 3.9 mmol/L (ref 3.5–5.1)
Sodium: 135 mmol/L (ref 135–145)

## 2022-02-17 LAB — GLUCOSE, CAPILLARY
Glucose-Capillary: 106 mg/dL — ABNORMAL HIGH (ref 70–99)
Glucose-Capillary: 118 mg/dL — ABNORMAL HIGH (ref 70–99)
Glucose-Capillary: 122 mg/dL — ABNORMAL HIGH (ref 70–99)
Glucose-Capillary: 182 mg/dL — ABNORMAL HIGH (ref 70–99)

## 2022-02-17 SURGERY — ECHOCARDIOGRAM, TRANSESOPHAGEAL
Anesthesia: Monitor Anesthesia Care

## 2022-02-17 MED ORDER — PROPOFOL 10 MG/ML IV BOLUS
INTRAVENOUS | Status: DC | PRN
  Administered 2022-02-17 (×2): 20 mg via INTRAVENOUS

## 2022-02-17 MED ORDER — IOHEXOL 9 MG/ML PO SOLN
ORAL | Status: AC
  Administered 2022-02-18: 500 mL
  Filled 2022-02-17: qty 1000

## 2022-02-17 MED ORDER — AMIODARONE HCL IN DEXTROSE 360-4.14 MG/200ML-% IV SOLN: 30.0000 mg/h | INTRAVENOUS | Status: DC

## 2022-02-17 MED ORDER — EPHEDRINE SULFATE-NACL 50-0.9 MG/10ML-% IV SOSY
PREFILLED_SYRINGE | INTRAVENOUS | Status: DC | PRN
  Administered 2022-02-17: 5 mg via INTRAVENOUS
  Administered 2022-02-17 (×2): 10 mg via INTRAVENOUS

## 2022-02-17 MED ORDER — PHENYLEPHRINE 80 MCG/ML (10ML) SYRINGE FOR IV PUSH (FOR BLOOD PRESSURE SUPPORT)
PREFILLED_SYRINGE | INTRAVENOUS | Status: DC | PRN
  Administered 2022-02-17: 80 ug via INTRAVENOUS
  Administered 2022-02-17 (×2): 160 ug via INTRAVENOUS

## 2022-02-17 MED ORDER — PHENOL 1.4 % MT LIQD
1.0000 | OROMUCOSAL | Status: DC | PRN
  Filled 2022-02-17: qty 177

## 2022-02-17 MED ORDER — AMIODARONE HCL 200 MG PO TABS
400.0000 mg | ORAL_TABLET | Freq: Two times a day (BID) | ORAL | Status: DC
  Administered 2022-02-17 – 2022-02-24 (×15): 400 mg via ORAL
  Filled 2022-02-17 (×15): qty 2

## 2022-02-17 MED ORDER — PROPOFOL 500 MG/50ML IV EMUL
INTRAVENOUS | Status: DC | PRN
  Administered 2022-02-17: 80 ug/kg/min via INTRAVENOUS

## 2022-02-17 MED ORDER — LIDOCAINE 2% (20 MG/ML) 5 ML SYRINGE
INTRAMUSCULAR | Status: DC | PRN
  Administered 2022-02-17: 100 mg via INTRAVENOUS

## 2022-02-17 NOTE — Interval H&P Note (Signed)
History and Physical Interval Note:  02/17/2022 7:53 AM  Antonio Clark  has presented today for surgery, with the diagnosis of atrial fibrillation.  The various methods of treatment have been discussed with the patient and family. After consideration of risks, benefits and other options for treatment, the patient has consented to  Procedure(s): TRANSESOPHAGEAL ECHOCARDIOGRAM (TEE) (N/A) CARDIOVERSION (N/A) as a surgical intervention.  The patient's history has been reviewed, patient examined, no change in status, stable for surgery.  I have reviewed the patient's chart and labs.  Questions were answered to the patient's satisfaction.     [employer]

## 2022-02-17 NOTE — Progress Notes (Signed)
[facility] Progress Note   Subjective:   Groggy s/p sedation for TEE/DCCV.  No new complaints  Objective Vitals:   02/17/22 0456 02/17/22 0645 02/17/22 0741 02/17/22 0850  BP:   130/81 109/65  Pulse:   67 71  Resp:   18 (!) 24  Temp:   (!) 96.8 F (36 C)   TempSrc:   Temporal   SpO2: 97%  92% 97%  Weight:  80.2 kg     Physical Exam General: sleepy, arousable Heart: RRR Lungs: clear Abdomen: soft, nontender Extremities: no edema  Additional Objective Labs: Basic Metabolic Panel: Recent Labs  Lab 02/16/22 0120 02/16/22 0441 02/16/22 1324 02/17/22 0608  NA 134*  --  135 135  K 6.0* 5.4* 5.6* 3.9  CL 103  --  100 104  CO2 16*  --  18* 18*  GLUCOSE 157*  --  180* 128*  BUN 57*  --  68* 70*  CREATININE 2.56*  --  2.99* 2.59*  CALCIUM 8.6*  --  8.8* 8.0*  PHOS  --   --   --  5.3*   Liver Function Tests: Recent Labs  Lab 02/17/22 0608  ALBUMIN 2.9*   No results for input(s): "LIPASE", "AMYLASE" in the last 168 hours. CBC: Recent Labs  Lab 02/13/22 0632 02/14/22 0258 02/15/22 0124 02/16/22 0120 02/17/22 0608  WBC 6.0 8.4 8.7 9.8 6.6  HGB 12.4* 12.6* 12.5* 12.1* 11.2*  HCT 39.3 40.7 38.5* 38.6* 34.3*  MCV 91.0 92.1 89.7 91.5 88.4  PLT 138* 165 145* 111* 67*   Blood Culture No results found for: "SDES", "SPECREQUEST", "CULT", "REPTSTATUS"  Cardiac Enzymes: No results for input(s): "CKTOTAL", "CKMB", "CKMBINDEX", "TROPONINI" in the last 168 hours. CBG: Recent Labs  Lab 02/16/22 0752 02/16/22 1203 02/16/22 1625 02/16/22 2127 02/17/22 0741  GLUCAP 148* 164* 153* 172* 106*   Iron Studies: No results for input(s): "IRON", "TIBC", "TRANSFERRIN", "FERRITIN" in the last 72 hours. '@lablastinr3'$ @ Studies/Results: ECHO TEE  Result Date: 02/17/2022    TRANSESOPHOGEAL ECHO REPORT   Patient Name:   BELLAMY JUDSON Kotas Date of Exam: 02/17/2022 Medical Rec #:  [id number]     Height:       69.0 in Accession #:    [id number]    Weight:       176.9 lb  Date of Birth:  March 07, 1936     BSA:          1.961 m Patient Age:    52 years      BP:           107/87 mmHg Patient Gender: M             HR:           70 bpm. Exam Location:  Inpatient Procedure: Transesophageal Echo, Cardiac Doppler and Color Doppler Indications:     Afib  History:         Patient has prior history of Echocardiogram examinations, most                  recent 02/09/2022. CHF; Risk Factors:Hypertension and                  Dyslipidemia.  Sonographer:     Darlina Sicilian RDCS Referring Phys:  [id number] Thana Farr SKAINS Diagnosing Phys: Candee Furbish MD PROCEDURE: After discussion of the risks and benefits of a TEE, an informed consent was obtained from the patient. TEE procedure time was 8 minutes. The transesophogeal probe was passed  without difficulty through the esophogus of the patient. Imaged were  obtained with the patient in a left lateral decubitus position. Sedation performed by different physician. The patient was monitored while under deep sedation. Anesthestetic sedation was provided intravenously by Anesthesiology: 191.'58mg'$  of Propofol, '100mg'$  of Lidocaine. Image quality was good. The patient's vital signs; including heart rate, blood pressure, and oxygen saturation; remained stable throughout the procedure. The patient developed no complications during the procedure. A successful direct  current cardioversion was performed at 200 joules with 1 attempt. IMPRESSIONS  1. Left ventricular ejection fraction, by estimation, is <20%. The left ventricle has severely decreased function. The left ventricle demonstrates global hypokinesis.  2. Right ventricular systolic function is moderately reduced. The right ventricular size is normal. There is normal pulmonary artery systolic pressure. The estimated right ventricular systolic pressure is 36.6 mmHg.  3. Left atrial size was severely dilated. No left atrial/left atrial appendage thrombus was detected.  4. Right atrial size was severely dilated.  5. The  mitral valve is normal in structure. Mild to moderate mitral valve regurgitation. No evidence of mitral stenosis.  6. Tricuspid valve regurgitation is severe.  7. The aortic valve is tricuspid. There is mild calcification of the aortic valve. Aortic valve regurgitation is mild. No aortic stenosis is present.  8. There is Moderate (Grade III) plaque.  9. The inferior vena cava is normal in size with greater than 50% respiratory variability, suggesting right atrial pressure of 3 mmHg. 10. Agitated saline contrast bubble study was negative, with no evidence of any interatrial shunt. Conclusion(s)/Recommendation(s): No LA/LAA thrombus identified. Successful cardioversion performed with restoration of normal sinus rhythm. FINDINGS  Left Ventricle: Left ventricular ejection fraction, by estimation, is <20%. The left ventricle has severely decreased function. The left ventricle demonstrates global hypokinesis. The left ventricular internal cavity size was normal in size. There is no  left ventricular hypertrophy. Right Ventricle: The right ventricular size is normal. No increase in right ventricular wall thickness. Right ventricular systolic function is moderately reduced. There is normal pulmonary artery systolic pressure. The tricuspid regurgitant velocity is 2.61 m/s, and with an assumed right atrial pressure of 3 mmHg, the estimated right ventricular systolic pressure is 44.0 mmHg. Left Atrium: Left atrial size was severely dilated. No left atrial/left atrial appendage thrombus was detected. Right Atrium: Right atrial size was severely dilated. Pericardium: There is no evidence of pericardial effusion. Mitral Valve: The mitral valve is normal in structure. Mild to moderate mitral valve regurgitation. No evidence of mitral valve stenosis. Tricuspid Valve: The tricuspid valve is normal in structure. Tricuspid valve regurgitation is severe. No evidence of tricuspid stenosis. Aortic Valve: The aortic valve is tricuspid.  There is mild calcification of the aortic valve. Aortic valve regurgitation is mild. No aortic stenosis is present. Pulmonic Valve: The pulmonic valve was normal in structure. Pulmonic valve regurgitation is mild. No evidence of pulmonic stenosis. Aorta: The aortic root is normal in size and structure. There is moderate (Grade III) plaque. Venous: The inferior vena cava is normal in size with greater than 50% respiratory variability, suggesting right atrial pressure of 3 mmHg. IAS/Shunts: No atrial level shunt detected by color flow Doppler. Agitated saline contrast was given intravenously to evaluate for intracardiac shunting. Agitated saline contrast bubble study was negative, with no evidence of any interatrial shunt. There  is no evidence of a patent foramen ovale. There is no evidence of an atrial septal defect.  TRICUSPID VALVE TR Peak grad:   27.2 mmHg TR Vmax:  261.00 cm/s Candee Furbish MD Electronically signed by Candee Furbish MD Signature Date/Time: 02/17/2022/9:49:50 AM    Final    US RENAL  Result Date: 02/16/2022 CLINICAL DATA:  Acute kidney injury EXAM: RENAL / URINARY TRACT ULTRASOUND COMPLETE COMPARISON:  CT abdomen and pelvis 10/02/2009 FINDINGS: Right Kidney: Renal measurements: 10.1 x 5.3 x 6.8 cm = volume: 162 mL. Echogenicity within normal limits. No mass or hydronephrosis visualized. Left Kidney: Renal measurements: 12.0 x 6.5 x 4.8 cm = volume: 125 mL. Echogenicity within normal limits. No hydronephrosis. There are 2 cysts in the left kidney measuring up to 1.4 cm. Bladder: Appears normal for degree of bladder distention. Other: Within the left lower quadrant there is a 4.1 x 4.5 x 4.7 cm cystic structure with some peripheral echogenic focal areas adjacent to the wall. This is indeterminate. IMPRESSION: 1. No hydronephrosis. 2. Small left renal cysts measuring up to 1.4 cm. 3. Indeterminate 4.7 cm cystic structure, mildly complex, in the left lower quadrant. Recommend further evaluation  with CT. Electronically Signed   By: Ronney Asters M.D.   On: 02/16/2022 19:33   DG Chest 2 View  Result Date: 02/16/2022 CLINICAL DATA:  Shortness of breath. EXAM: CHEST - 2 VIEW COMPARISON:  02/08/2022 FINDINGS: Stable cardiomegaly. Previously seen interstitial infiltrates have resolved. Mild scarring again noted in both upper lobes. Lungs are otherwise clear. Surgical clips again seen in the right lateral chest wall soft tissues. IMPRESSION: Stable cardiomegaly. No active lung disease. Electronically Signed   By: Marlaine Hind M.D.   On: 02/16/2022 10:18   Medications:  sodium chloride     amiodarone 30 mg/hr (02/17/22 0806)    aspirin EC  81 mg Oral Daily   atorvastatin  80 mg Oral Daily   dabigatran  150 mg Oral Q12H   diclofenac Sodium  4 g Topical QID   finasteride  5 mg Oral Daily   insulin aspart  0-5 Units Subcutaneous QHS   insulin aspart  0-9 Units Subcutaneous TID WC   linagliptin  5 mg Oral Daily   loratadine  10 mg Oral Daily   metoprolol succinate  50 mg Oral Daily   sodium chloride flush  3 mL Intravenous Q12H   sodium chloride flush  3 mL Intravenous Q12H    Assessment/Plan: HEINZ ECKERT is an 86 y.o. male with A fib, type 2 DM, HTN, HL, h/o melanoma, h/o prostate cancer, OSA on CPAP currently admitted since 7/2 for unstable angina now s/p LHC x 2 without need for PCI who is being seen by nephrology for evaluation and management of AKI.     **AKI:  normal baseline renal function now with AKI Cr 2.3 following LHC x 2 and transient hypotension.   Suspect multifactorial contrast + hypoperfusion.   No obstruction on [place], UA not consistent with GN.   He appears euvolemic currently and I don't think volume expansion would be helpful at this time.  Kidney function improving.  Will follow closely with daily labs, I/Os.  Avoid nephrotoxins and hypovolemia.     **Hyperkalemia:  normal now s/p 1 dose of lokelma.    **Metabolic acidosis: secondary to AKI, follow and if worse start  po bicarb   **CAD, unstable angina, HFrEF 25-30% --> now 15-20% with severe TR on echo 7/11:  being medically managed by cardiology   **Atrial fibrillation:  s/p DCCV 7/11, per cardiology.     **Incidental L cystic structure noted on [place]: noncontrast CT abd/pelvis per rads rec.  Will follow, page with questions. Discussed with 'caregiver' bedside today  Jannifer Hick MD 02/17/2022, 11:04 AM  [facility] Pager: [phone]

## 2022-02-17 NOTE — Anesthesia Postprocedure Evaluation (Deleted)
Anesthesia Post Note  Patient: Antonio Clark  Procedure(s) Performed: TRANSESOPHAGEAL ECHOCARDIOGRAM (TEE) CARDIOVERSION     Anesthesia Post Evaluation No notable events documented.  Last Vitals:  Vitals:   02/17/22 0456 02/17/22 0741  BP:  130/81  Pulse:  67  Resp:  18  Temp:  (!) 36 C  SpO2: 97% 92%    Last Pain:  Vitals:   02/17/22 0741  TempSrc: Temporal  PainSc:                  Carold Eisner L Justine Dines

## 2022-02-17 NOTE — Progress Notes (Signed)
Echocardiogram Echocardiogram Transesophageal has been performed.  Darlina Sicilian M 02/17/2022, 8:54 AM

## 2022-02-17 NOTE — Anesthesia Postprocedure Evaluation (Signed)
Anesthesia Post Note  Patient: Antonio Clark  Procedure(s) Performed: TRANSESOPHAGEAL ECHOCARDIOGRAM (TEE) CARDIOVERSION     Patient location during evaluation: PACU Anesthesia Type: MAC Level of consciousness: awake and alert Pain management: pain level controlled Vital Signs Assessment: post-procedure vital signs reviewed and stable Respiratory status: spontaneous breathing, nonlabored ventilation, respiratory function stable and patient connected to nasal cannula oxygen Cardiovascular status: stable and blood pressure returned to baseline Postop Assessment: no apparent nausea or vomiting Anesthetic complications: no   No notable events documented.  Last Vitals:  Vitals:   02/17/22 0741 02/17/22 0850  BP: 130/81 109/65  Pulse: 67 71  Resp: 18 (!) 24  Temp: (!) 36 C   SpO2: 92% 97%    Last Pain:  Vitals:   02/17/22 0850  TempSrc:   PainSc: [place] A Jeannett Dekoning

## 2022-02-17 NOTE — Progress Notes (Signed)
Progress Note  Patient Name: Antonio Clark Date of Encounter: 02/17/2022  [facility] Cardiologist: Candee Furbish, MD   Subjective   Had TEE guided cardioversion earlier today.  Back in room resting, and to this point maintaining sinus bradycardia.  Discussed the situation with the patient's daughter.  Plan is to convert to oral amiodarone.  Kidney function is improving.  Convert to oral amiodarone today.  Inpatient Medications    Scheduled Meds:  aspirin EC  81 mg Oral Daily   atorvastatin  80 mg Oral Daily   dabigatran  150 mg Oral Q12H   diclofenac Sodium  4 g Topical QID   finasteride  5 mg Oral Daily   insulin aspart  0-5 Units Subcutaneous QHS   insulin aspart  0-9 Units Subcutaneous TID WC   linagliptin  5 mg Oral Daily   loratadine  10 mg Oral Daily   metoprolol succinate  50 mg Oral Daily   sodium chloride flush  3 mL Intravenous Q12H   sodium chloride flush  3 mL Intravenous Q12H   Continuous Infusions:  sodium chloride     amiodarone 30 mg/hr (02/17/22 0806)   PRN Meds: sodium chloride, acetaminophen, fluticasone, LORazepam, nitroGLYCERIN, ondansetron (ZOFRAN) IV, sodium chloride flush, traZODone   Vital Signs    Vitals:   02/17/22 0456 02/17/22 0645 02/17/22 0741 02/17/22 0850  BP:   130/81 109/65  Pulse:   67 71  Resp:   18 (!) 24  Temp:   (!) 96.8 F (36 C)   TempSrc:   Temporal   SpO2: 97%  92% 97%  Weight:  80.2 kg      Intake/Output Summary (Last 24 hours) at 02/17/2022 1057 Last data filed at 02/17/2022 0843 Gross per 24 hour  Intake 490 ml  Output 150 ml  Net 340 ml      02/17/2022    6:45 AM 02/16/2022    4:59 AM 02/15/2022    4:51 AM  Last 3 Weights  Weight (lbs) 176 lb 14.4 oz 177 lb 176 lb 3.2 oz  Weight (kg) 80.241 kg 80.287 kg 79.924 kg      Telemetry    Sinus bradycardia post cardioversion- Personally Reviewed  ECG    Post cardioversion EKG at 8:55 AM demonstrates sinus rhythm, first-degree AV block (PR 216 ms)  nonspecific T wave flattening.  Otherwise unremarkable.- Personally Reviewed  Physical Exam  He is asleep.  His daughter is in the room. GEN: Somewhat pale in appearance Neck: Not evaluated with the patient sitting Cardiac: Bradycardia with RRR. Respiratory: Clear to auscultation bilaterally. MS: No edema; No deformity. Neuro: [place]   Labs    High Sensitivity Troponin:   Recent Labs  Lab 02/08/22 1617 02/08/22 1844  TROPONINIHS 34* 35*     Chemistry Recent Labs  Lab 02/16/22 0120 02/16/22 0441 02/16/22 1324 02/17/22 0608  NA 134*  --  135 135  K 6.0* 5.4* 5.6* 3.9  CL 103  --  100 104  CO2 16*  --  18* 18*  GLUCOSE 157*  --  180* 128*  BUN 57*  --  68* 70*  CREATININE 2.56*  --  2.99* 2.59*  CALCIUM 8.6*  --  8.8* 8.0*  ALBUMIN  --   --   --  2.9*  GFRNONAA 24*  --  20* 24*  ANIONGAP 15  --  17* 13    Lipids No results for input(s): "CHOL", "TRIG", "HDL", "LABVLDL", "LDLCALC", "CHOLHDL" in the last 168 hours.  Hematology Recent Labs  Lab 02/15/22 0124 02/16/22 0120 02/17/22 0608  WBC 8.7 9.8 6.6  RBC 4.29 4.22 3.88*  HGB 12.5* 12.1* 11.2*  HCT 38.5* 38.6* 34.3*  MCV 89.7 91.5 88.4  MCH 29.1 28.7 28.9  MCHC 32.5 31.3 32.7  RDW 15.9* 15.7* 15.7*  PLT 145* 111* 67*   Thyroid No results for input(s): "TSH", "FREET4" in the last 168 hours.  BNPNo results for input(s): "BNP", "PROBNP" in the last 168 hours.  DDimer No results for input(s): "DDIMER" in the last 168 hours.   Radiology    ECHO TEE  Result Date: 02/17/2022    TRANSESOPHOGEAL ECHO REPORT   Patient Name:   Antonio Clark Dial Date of Exam: 02/17/2022 Medical Rec #:  [id number]     Height:       69.0 in Accession #:    [id number]    Weight:       176.9 lb Date of Birth:  30-Apr-1936     BSA:          1.961 m Patient Age:    86 years      BP:           107/87 mmHg Patient Gender: M             HR:           70 bpm. Exam Location:  Inpatient Procedure: Transesophageal Echo, Cardiac Doppler and Color Doppler  Indications:     Afib  History:         Patient has prior history of Echocardiogram examinations, most                  recent 02/09/2022. CHF; Risk Factors:Hypertension and                  Dyslipidemia.  Sonographer:     Darlina Sicilian RDCS Referring Phys:  [id number] Thana Farr SKAINS Diagnosing Phys: Candee Furbish MD PROCEDURE: After discussion of the risks and benefits of a TEE, an informed consent was obtained from the patient. TEE procedure time was 8 minutes. The transesophogeal probe was passed without difficulty through the esophogus of the patient. Imaged were  obtained with the patient in a left lateral decubitus position. Sedation performed by different physician. The patient was monitored while under deep sedation. Anesthestetic sedation was provided intravenously by Anesthesiology: 191.'58mg'$  of Propofol, '100mg'$  of Lidocaine. Image quality was good. The patient's vital signs; including heart rate, blood pressure, and oxygen saturation; remained stable throughout the procedure. The patient developed no complications during the procedure. A successful direct  current cardioversion was performed at 200 joules with 1 attempt. IMPRESSIONS  1. Left ventricular ejection fraction, by estimation, is <20%. The left ventricle has severely decreased function. The left ventricle demonstrates global hypokinesis.  2. Right ventricular systolic function is moderately reduced. The right ventricular size is normal. There is normal pulmonary artery systolic pressure. The estimated right ventricular systolic pressure is 95.0 mmHg.  3. Left atrial size was severely dilated. No left atrial/left atrial appendage thrombus was detected.  4. Right atrial size was severely dilated.  5. The mitral valve is normal in structure. Mild to moderate mitral valve regurgitation. No evidence of mitral stenosis.  6. Tricuspid valve regurgitation is severe.  7. The aortic valve is tricuspid. There is mild calcification of the aortic valve. Aortic valve  regurgitation is mild. No aortic stenosis is present.  8. There is Moderate (Grade III) plaque.  9. The inferior vena cava  is normal in size with greater than 50% respiratory variability, suggesting right atrial pressure of 3 mmHg. 10. Agitated saline contrast bubble study was negative, with no evidence of any interatrial shunt. Conclusion(s)/Recommendation(s): No LA/LAA thrombus identified. Successful cardioversion performed with restoration of normal sinus rhythm. FINDINGS  Left Ventricle: Left ventricular ejection fraction, by estimation, is <20%. The left ventricle has severely decreased function. The left ventricle demonstrates global hypokinesis. The left ventricular internal cavity size was normal in size. There is no  left ventricular hypertrophy. Right Ventricle: The right ventricular size is normal. No increase in right ventricular wall thickness. Right ventricular systolic function is moderately reduced. There is normal pulmonary artery systolic pressure. The tricuspid regurgitant velocity is 2.61 m/s, and with an assumed right atrial pressure of 3 mmHg, the estimated right ventricular systolic pressure is 71.0 mmHg. Left Atrium: Left atrial size was severely dilated. No left atrial/left atrial appendage thrombus was detected. Right Atrium: Right atrial size was severely dilated. Pericardium: There is no evidence of pericardial effusion. Mitral Valve: The mitral valve is normal in structure. Mild to moderate mitral valve regurgitation. No evidence of mitral valve stenosis. Tricuspid Valve: The tricuspid valve is normal in structure. Tricuspid valve regurgitation is severe. No evidence of tricuspid stenosis. Aortic Valve: The aortic valve is tricuspid. There is mild calcification of the aortic valve. Aortic valve regurgitation is mild. No aortic stenosis is present. Pulmonic Valve: The pulmonic valve was normal in structure. Pulmonic valve regurgitation is mild. No evidence of pulmonic stenosis. Aorta: The  aortic root is normal in size and structure. There is moderate (Grade III) plaque. Venous: The inferior vena cava is normal in size with greater than 50% respiratory variability, suggesting right atrial pressure of 3 mmHg. IAS/Shunts: No atrial level shunt detected by color flow Doppler. Agitated saline contrast was given intravenously to evaluate for intracardiac shunting. Agitated saline contrast bubble study was negative, with no evidence of any interatrial shunt. There  is no evidence of a patent foramen ovale. There is no evidence of an atrial septal defect.  TRICUSPID VALVE TR Peak grad:   27.2 mmHg TR Vmax:        261.00 cm/s Candee Furbish MD Electronically signed by Candee Furbish MD Signature Date/Time: 02/17/2022/9:49:50 AM    Final    US RENAL  Result Date: 02/16/2022 CLINICAL DATA:  Acute kidney injury EXAM: RENAL / URINARY TRACT ULTRASOUND COMPLETE COMPARISON:  CT abdomen and pelvis 10/02/2009 FINDINGS: Right Kidney: Renal measurements: 10.1 x 5.3 x 6.8 cm = volume: 162 mL. Echogenicity within normal limits. No mass or hydronephrosis visualized. Left Kidney: Renal measurements: 12.0 x 6.5 x 4.8 cm = volume: 125 mL. Echogenicity within normal limits. No hydronephrosis. There are 2 cysts in the left kidney measuring up to 1.4 cm. Bladder: Appears normal for degree of bladder distention. Other: Within the left lower quadrant there is a 4.1 x 4.5 x 4.7 cm cystic structure with some peripheral echogenic focal areas adjacent to the wall. This is indeterminate. IMPRESSION: 1. No hydronephrosis. 2. Small left renal cysts measuring up to 1.4 cm. 3. Indeterminate 4.7 cm cystic structure, mildly complex, in the left lower quadrant. Recommend further evaluation with CT. Electronically Signed   By: Ronney Asters M.D.   On: 02/16/2022 19:33   DG Chest 2 View  Result Date: 02/16/2022 CLINICAL DATA:  Shortness of breath. EXAM: CHEST - 2 VIEW COMPARISON:  02/08/2022 FINDINGS: Stable cardiomegaly. Previously seen  interstitial infiltrates have resolved. Mild scarring again noted in both  upper lobes. Lungs are otherwise clear. Surgical clips again seen in the right lateral chest wall soft tissues. IMPRESSION: Stable cardiomegaly. No active lung disease. Electronically Signed   By: Marlaine Hind M.D.   On: 02/16/2022 10:18    Cardiac Studies   No new data  Patient Profile     86 y.o. male persistent A-fib status post ablation in 04/2020 with recurrent A-fib status post DCCV in 05/2020 on Pradaxa, DM2, HTN, HLD, strong family history of CAD, melanoma, prostate cancer, and sleep apnea on CPAP who was admitted to [facility] on 02/08/2022 with  acute HFrEF (EF less than 20% by TEE 02/17/2022) and A-fib with RVR.  He underwent R/LHC on the afternoon of 02/11/2022 which showed severe ostial left main stenosis but IVUS showed MLA >12 millimeters square.  Subsequently underwent electrical cardioversion to sinus bradycardia on IV amiodarone which will be converted to oral therapy..  Assessment & Plan    Severe mixed cardiomyopathy resulting in acute on chronic systolic heart failure: Improved with diuresis.  Initiation of heart failure therapy limited by acute kidney injury and soft blood pressures.  When safe will initiate SGLT2.  Already on metoprolol XL.  It would be a while before we can use ARNI/ARB therapy or MRA. Persistent atrial fibrillation: Successful cardioversion earlier today.  Still on IV amiodarone.  Will convert to p.o. dosing this evening. Acute on chronic kidney injury stage IV: Improving  For questions or updates, please contact [place] Please consult [email or web address] for contact info under        Signed, Sinclair Grooms, MD  02/17/2022, 10:57 AM

## 2022-02-17 NOTE — CV Procedure (Signed)
   Transesophageal Echocardiogram  Indications: Atrial fibrillation, cardiomyopathy  Time out performed  During this procedure the patient was administered propofol under anesthesiology supervision to achieve and maintain moderate sedation.  The patient's heart rate, blood pressure, and oxygen saturation are monitored continuously during the procedure.   Findings:  Left Ventricle: EF 15 to 20% severely reduced  Mitral Valve: Mild to moderate mitral regurgitation  Aortic Valve: Mild aortic regurgitation  Tricuspid Valve: Severe tricuspid regurgitation  Left Atrium: Normal, no left atrial appendage thrombus  Right Atrium: Normal  Aorta: Mild to moderate plaque  Intraatrial septum: Normal, no PFO  Bubble Contrast Study: Normal  Candee Furbish, MD      Electrical Cardioversion Procedure Note Antonio Clark [id number] Sep 17, 1935  Procedure: Electrical Cardioversion Indications:  Atrial Fibrillation  Time Out: Verified patient identification, verified procedure,medications/allergies/relevent history reviewed, required imaging and test results available.  Performed  Procedure Details  The patient was NPO after midnight. Anesthesia was administered at the beside  by Dr.Houser/anesthesia team with propofol.  Cardioversion was performed with synchronized biphasic defibrillation via AP pads with 200 joules.  1 attempt(s) were performed.  The patient converted to normal sinus rhythm. The patient tolerated the procedure well   IMPRESSION:  Successful cardioversion of atrial fibrillation    Candee Furbish 02/17/2022, 8:45 AM

## 2022-02-17 NOTE — Transfer of Care (Signed)
Immediate Anesthesia Transfer of Care Note  Patient: Antonio Clark  Procedure(s) Performed: TRANSESOPHAGEAL ECHOCARDIOGRAM (TEE) CARDIOVERSION  Patient Location: Endoscopy Unit  Anesthesia Type:MAC  Level of Consciousness: drowsy  Airway & Oxygen Therapy: Patient Spontanous Breathing and Patient connected to face mask oxygen  Post-op Assessment: Report given to RN and Post -op Vital signs reviewed and stable  Post vital signs: Reviewed and stable  Last Vitals:  Vitals Value Taken Time  BP 109/65 02/17/22 0851  Temp    Pulse 67 02/17/22 0852  Resp 21 02/17/22 0852  SpO2 97 % 02/17/22 0852  Vitals shown include unvalidated device data.  Last Pain:  Vitals:   02/17/22 0850  TempSrc:   PainSc: Asleep      Patients Stated Pain Goal: 0 (62/82/41 7530)  Complications: No notable events documented.

## 2022-02-17 NOTE — Anesthesia Procedure Notes (Signed)
Procedure Name: General with mask airway Date/Time: 02/17/2022 8:14 AM  Performed by: Erick Colace, CRNAPre-anesthesia Checklist: Patient identified, Emergency Drugs available, Suction available and Patient being monitored Patient Re-evaluated:Patient Re-evaluated prior to induction Oxygen Delivery Method: Nasal cannula Preoxygenation: Pre-oxygenation with 100% oxygen Induction Type: IV induction Dental Injury: Teeth and Oropharynx as per pre-operative assessment

## 2022-02-17 NOTE — Telephone Encounter (Signed)
Lesions identified by the path report this mupirocin is not for the nose its for the lesions biopsied.

## 2022-02-18 ENCOUNTER — Inpatient Hospital Stay (HOSPITAL_COMMUNITY): Payer: Medicare Other

## 2022-02-18 ENCOUNTER — Other Ambulatory Visit (HOSPITAL_COMMUNITY): Payer: Self-pay

## 2022-02-18 ENCOUNTER — Telehealth (HOSPITAL_COMMUNITY): Payer: Self-pay | Admitting: Pharmacy Technician

## 2022-02-18 LAB — CBC
HCT: 34.1 % — ABNORMAL LOW (ref 39.0–52.0)
Hemoglobin: 11.2 g/dL — ABNORMAL LOW (ref 13.0–17.0)
MCH: 29.2 pg (ref 26.0–34.0)
MCHC: 32.8 g/dL (ref 30.0–36.0)
MCV: 88.8 fL (ref 80.0–100.0)
Platelets: 84 10*3/uL — ABNORMAL LOW (ref 150–400)
RBC: 3.84 MIL/uL — ABNORMAL LOW (ref 4.22–5.81)
RDW: 15.8 % — ABNORMAL HIGH (ref 11.5–15.5)
WBC: 6.4 10*3/uL (ref 4.0–10.5)
nRBC: 0 % (ref 0.0–0.2)

## 2022-02-18 LAB — GLUCOSE, CAPILLARY
Glucose-Capillary: 149 mg/dL — ABNORMAL HIGH (ref 70–99)
Glucose-Capillary: 165 mg/dL — ABNORMAL HIGH (ref 70–99)
Glucose-Capillary: 155 mg/dL — ABNORMAL HIGH (ref 70–99)
Glucose-Capillary: 139 mg/dL — ABNORMAL HIGH (ref 70–99)

## 2022-02-18 LAB — PROTIME-INR
INR: 9.9 (ref 0.8–1.2)
Prothrombin Time: 78.6 seconds — ABNORMAL HIGH (ref 11.4–15.2)

## 2022-02-18 LAB — BASIC METABOLIC PANEL
Anion gap: 8 (ref 5–15)
BUN: 61 mg/dL — ABNORMAL HIGH (ref 8–23)
CO2: 20 mmol/L — ABNORMAL LOW (ref 22–32)
Calcium: 8 mg/dL — ABNORMAL LOW (ref 8.9–10.3)
Chloride: 108 mmol/L (ref 98–111)
Creatinine, Ser: 2.26 mg/dL — ABNORMAL HIGH (ref 0.61–1.24)
GFR, Estimated: 28 mL/min — ABNORMAL LOW (ref >60)
Glucose, Bld: 139 mg/dL — ABNORMAL HIGH (ref 70–99)
Potassium: 3.9 mmol/L (ref 3.5–5.1)
Sodium: 136 mmol/L (ref 135–145)

## 2022-02-18 MED ORDER — IPRATROPIUM-ALBUTEROL 0.5-2.5 (3) MG/3ML IN SOLN
3.0000 mL | Freq: Once | RESPIRATORY_TRACT | Status: AC
Start: 2022-02-18 — End: 2022-02-18
  Administered 2022-02-18: 3 mL via RESPIRATORY_TRACT
  Filled 2022-02-18: qty 3

## 2022-02-18 MED ORDER — DAPAGLIFLOZIN PROPANEDIOL 10 MG PO TABS
10.0000 mg | ORAL_TABLET | Freq: Every day | ORAL | Status: DC
  Administered 2022-02-18 – 2022-02-24 (×7): 10 mg via ORAL
  Filled 2022-02-18 (×7): qty 1

## 2022-02-18 MED ORDER — APIXABAN 2.5 MG PO TABS
2.5000 mg | ORAL_TABLET | Freq: Two times a day (BID) | ORAL | Status: DC
  Administered 2022-02-18 – 2022-02-19 (×3): 2.5 mg via ORAL
  Filled 2022-02-18 (×3): qty 1

## 2022-02-18 MED ORDER — FUROSEMIDE 10 MG/ML IJ SOLN
60.0000 mg | Freq: Once | INTRAMUSCULAR | Status: AC
  Administered 2022-02-18: 60 mg via INTRAVENOUS
  Filled 2022-02-18: qty 6

## 2022-02-18 NOTE — Evaluation (Addendum)
Physical Therapy Evaluation Patient Details Name: Antonio Clark MRN: [id number] DOB: 1935-10-11 Today's Date: 02/18/2022  History of Present Illness  pt is an 86 y/o male admitted 7/5 from [facility] with acute HFrEF/afib with RVR and having underwent R/L HC on 7/5 showing severe ostial L main stenosis, it was recommended to transfer to [facility] for revascularization.  s/p TEE cardioversion 7/11.  PMHx:  Afib, DM2, HTN, prostate CA, ablation, coronary angiography,  Clinical Impression  Pt admitted with/for HFrEF/afib with RVR  and for interventions above.  Pt now weak and deconditioned after 6 days with the result that he likely will not be immediately safe at home alone.  Pt currently limited functionally due to the problems listed. ( See problems list.)   Pt will benefit from PT to maximize function and safety in order to get ready for next venue listed below.        Recommendations for follow up therapy are one component of a multi-disciplinary discharge planning process, led by the attending physician.  Recommendations may be updated based on patient status, additional functional criteria and insurance authorization.  Follow Up Recommendations Acute inpatient rehab (3hours/day) (unless  pt has family, etc that can help for about a week as necessary to maintain safety until better.) Can patient physically be transported by private vehicle: Yes    Assistance Recommended at Discharge Intermittent Supervision/Assistance  Patient can return home with the following  A little help with walking and/or transfers;A little help with bathing/dressing/bathroom;Assistance with cooking/housework;Assist for transportation;Help with stairs or ramp for entrance    Equipment Recommendations Other (comment) (RW if doesn't have one.  He was vague)  Recommendations for Other Services       Functional Status Assessment Patient has had a recent decline in their functional status and demonstrates the ability to make  significant improvements in function in a reasonable and predictable amount of time.     Precautions / Restrictions Precautions Precautions: Fall      Mobility  Bed Mobility Overal bed mobility: Needs Assistance Bed Mobility: Supine to Sit, Sit to Supine     Supine to sit: Min guard Sit to supine: Min guard   General bed mobility comments: slow with no assist, flat bed.    Transfers Overall transfer level: Needs assistance Equipment used: Rolling walker (2 wheels), None Transfers: Sit to/from Stand Sit to Stand: Min guard           General transfer comment: cues for hand placement, no assist needed for boost.    Ambulation/Gait Ambulation/Gait assistance: Min assist Gait Distance (Feet): 170 Feet (x2) Assistive device: Rolling walker (2 wheels) Gait Pattern/deviations: Step-through pattern   Gait velocity interpretation: <1.8 ft/sec, indicate of risk for recurrent falls   General Gait Details: Mildly unsteady overall with drift and soft stagger as he fatigued.  1 standing rest to regroup, more assist as he fatigue with more cues for posture and proximity to the RW.  Stairs            Wheelchair Mobility    Modified Rankin (Stroke Patients Only)       Balance Overall balance assessment: Needs assistance Sitting-balance support: No upper extremity supported, Feet supported Sitting balance-Leahy Scale: Fair       Standing balance-Leahy Scale: Poor (to fair) Standing balance comment: still needing AD for safety                             Pertinent  Vitals/Pain Pain Assessment Pain Assessment: No/denies pain    Home Living Family/patient expects to be discharged to:: Private residence Living Arrangements: Alone Available Help at Discharge: Family;Available PRN/intermittently Type of Home: House Home Access: Stairs to enter Entrance Stairs-Rails: Right;Left   Alternate Level Stairs-Number of Steps: flight Home Layout:  Multi-level;Bed/bath upstairs Home Equipment: Cane - Holiday representative (2 wheels) (per pt has a RW)      Prior Function Prior Level of Function : Independent/Modified Independent             Mobility Comments: mod I lately with quad cane, more difficulty lately getting out of his softly padded chairs       Hand Dominance        Extremity/Trunk Assessment   Upper Extremity Assessment Upper Extremity Assessment: Generalized weakness    Lower Extremity Assessment Lower Extremity Assessment: Generalized weakness    Cervical / Trunk Assessment Cervical / Trunk Assessment: Normal  Communication   Communication: No difficulties  Cognition Arousal/Alertness: Awake/alert Behavior During Therapy: WFL for tasks assessed/performed Overall Cognitive Status: Within Functional Limits for tasks assessed                                          General Comments General comments (skin integrity, edema, etc.): vss    Exercises     Assessment/Plan    PT Assessment Patient needs continued PT services  PT Problem List Decreased strength;Decreased activity tolerance;Decreased balance;Decreased mobility;Decreased knowledge of use of DME       PT Treatment Interventions Gait training;DME instruction;Stair training;Functional mobility training;Therapeutic activities;Balance training;Patient/family education    PT Goals (Current goals can be found in the Care Plan section)  Acute Rehab PT Goals Patient Stated Goal: go home and get walking like I used to. PT Goal Formulation: With patient Time For Goal Achievement: 03/04/22 Potential to Achieve Goals: Good    Frequency Min 3X/week     Co-evaluation               AM-PAC PT "6 Clicks" Mobility  Outcome Measure Help needed turning from your back to your side while in a flat bed without using bedrails?: A Little Help needed moving from lying on your back to sitting on the side of a flat bed without using  bedrails?: A Little Help needed moving to and from a bed to a chair (including a wheelchair)?: A Little Help needed standing up from a chair using your arms (e.g., wheelchair or bedside chair)?: A Little Help needed to walk in hospital room?: A Little Help needed climbing 3-5 steps with a railing? : A Lot 6 Click Score: 17    End of Session   Activity Tolerance: Patient tolerated treatment well;Patient limited by fatigue Patient left: in bed;with call bell/phone within reach Nurse Communication: Mobility status PT Visit Diagnosis: Unsteadiness on feet (R26.81);Muscle weakness (generalized) (M62.81);Difficulty in walking, not elsewhere classified (R26.2)    Time: 9628-3662 PT Time Calculation (min) (ACUTE ONLY): 32 min   Charges:   PT Evaluation $PT Eval Moderate Complexity: 1 Mod PT Treatments $Gait Training: 8-22 mins        02/18/2022  Ginger Carne., PT Acute Rehabilitation Services [phone]  (pager) [phone]  (office)  Tessie Fass Blain Hunsucker 02/18/2022, 6:22 PM

## 2022-02-18 NOTE — Progress Notes (Signed)
SATURATION QUALIFICATIONS: (This note is used to comply with regulatory documentation for home oxygen)  Patient Saturations on Room Air at Rest = 97%  Patient Saturations on Room Air while Ambulating = 91%   

## 2022-02-18 NOTE — Discharge Instructions (Addendum)
======================================================   Dabigatran oral capsules What is this medicine? DABIGATRAN (DA bi GAT ran) is an anticoagulant (blood thinner). It is used to lower the chance of stroke in people with a medical condition called atrial fibrillation. It is also used to treat or prevent blood clots in the lungs or in the veins. This medicine may be used for other purposes; ask your health care provider or pharmacist if you have questions. COMMON BRAND NAME(S): Pradaxa  What should I tell my health care provider before I take this medicine? They need to know if you have any of these conditions: bleeding disorders antiphospholipid antibody syndrome history of stomach bleeding mechanical heart valve kidney disease recent or planned spinal or epidural procedure an allergic reaction to dabigatran, other medicines, foods, dyes, or preservatives pregnant or trying to get pregnant breast-feeding  How should I use this medicine? Take this medicine by mouth with a full glass of water. Follow the directions on the prescription label. Do not break, chew, or empty the contents from the capsule. Opening the capsule can increase your dose, which increases your risk of bleeding. You can take it with or without food. If it upsets your stomach, take it with food. Take your medicine at regular intervals. Do not take it more often than directed. Do not stop taking except on your doctor's advice. Stopping this medicine may increase your risk of a blot clot. Be sure to refill your prescription before you run out of medicine. A special MedGuide will be given to you by the pharmacist with each prescription and refill. Be sure to read this information carefully each time. Talk to your pediatrician regarding the use of this medicine in children. Special care may be needed. Overdosage: If you think you have taken too much of this medicine contact a poison control center or emergency room at  once. NOTE: This medicine is only for you. Do not share this medicine with others.  What if I miss a dose? If you miss a dose, take it as soon as you can. If your next dose is less than 6 hours away, skip the missed dose. Do not take double or extra doses.  What may interact with this medicine? Do not take this medicine with any of the following medications: defibrotide This medicine may also interact with the following: aspirin and aspirin-like medicines certain medicines that prevent or treat blood clots like warfarin, enoxaparin, dalteparin, apixaban, and rivaroxaban clopidogrel dronedarone ketoconazole NSAIDs, medicines for pain and inflammation, like ibuprofen or naproxen prasugrel rifampin This list may not describe all possible interactions. Give your health care provider a list of all the medicines, herbs, non-prescription drugs, or dietary supplements you use. Also tell them if you smoke, drink alcohol, or use illegal drugs. Some items may interact with your medicine.  What should I watch for while using this medicine? Visit your healthcare professional for regular checks on your progress. You may need blood work done while you are taking this medicine. Your condition will be monitored carefully while you are receiving this medicine. It is important not to miss any appointments. Avoid sports and activities that might cause injury while you are using this medicine. Severe falls or injuries can cause unseen bleeding. Be careful when using sharp tools or knives. Consider using an Copy. Take special care brushing or flossing your teeth. Report any injuries, bruising, or red spots on the skin to your healthcare professional. If you are going to need surgery or other procedure,  tell your healthcare professional that you are taking this medicine. Wear a medical ID bracelet or chain. Carry a card that describes your disease and details of your medicine and dosage times.  What  side effects may I notice from receiving this medicine? Side effects that you should report to your doctor or health care professional as soon as possible: allergic reactions like skin rash, itching or hives, swelling of the face, lips, or tongue signs and symptoms of bleeding such as bloody or black, tarry stools; red or dark-brown urine; spitting up blood or brown material that looks like coffee grounds; red spots on the skin; unusual bruising or bleeding from the eye, gums, or nose signs and symptoms of a blood clot such as chest pain; shortness of breath; pain, swelling, or warmth in the leg signs and symptoms of a stroke such as changes in vision; confusion; trouble speaking or understanding; severe headaches; sudden numbness or weakness of the face, arm or leg; trouble walking; dizziness; loss of coordination Side effects that usually do not require medical attention (report to your doctor or health care professional if they continue or are bothersome): stomach pain upset stomach This list may not describe all possible side effects. Call your doctor for medical advice about side effects. You may report side effects to FDA at 1-800-FDA-1088.  Where should I keep my medicine? Keep out of the reach of children. Store between 20 and 25 degrees C (68 and 77 degrees F). Keep this medicine in the original container. Throw away any unused medicine after 4 months. NOTE: This sheet is a summary. It may not cover all possible information. If you have questions about this medicine, talk to your doctor, pharmacist, or health care provider.  2020 Elsevier/Gold Standard (2018-07-15 01:49:12)

## 2022-02-18 NOTE — Progress Notes (Signed)
[facility] Progress Note   Subjective:   Feels fine.  Improved UOP.  No new symptoms.  Inquiring on discharge timeline.   Objective Vitals:   02/17/22 1950 02/18/22 0405 02/18/22 0551 02/18/22 0814  BP: (!) 108/49 106/63  126/66  Pulse: 67  68 66  Resp: '20 20 19 18  '$ Temp:  97.6 F (36.4 C)  98.2 F (36.8 C)  TempSrc:  Oral  Oral  SpO2: 96% 96% 97% 100%  Weight:  80.6 kg    Height:       Physical Exam General: awake and alert, comfortable Heart: RRR Lungs: clear Abdomen: soft, nontender Extremities: no edema  Additional Objective Labs: Basic Metabolic Panel: Recent Labs  Lab 02/16/22 1324 02/17/22 0608 02/18/22 0354  NA 135 135 136  K 5.6* 3.9 3.9  CL 100 104 108  CO2 18* 18* 20*  GLUCOSE 180* 128* 139*  BUN 68* 70* 61*  CREATININE 2.99* 2.59* 2.26*  CALCIUM 8.8* 8.0* 8.0*  PHOS  --  5.3*  --     Liver Function Tests: Recent Labs  Lab 02/17/22 0608  ALBUMIN 2.9*    No results for input(s): "LIPASE", "AMYLASE" in the last 168 hours. CBC: Recent Labs  Lab 02/14/22 0258 02/15/22 0124 02/16/22 0120 02/17/22 0608 02/18/22 0354  WBC 8.4 8.7 9.8 6.6 6.4  HGB 12.6* 12.5* 12.1* 11.2* 11.2*  HCT 40.7 38.5* 38.6* 34.3* 34.1*  MCV 92.1 89.7 91.5 88.4 88.8  PLT 165 145* 111* 67* 84*    Blood Culture No results found for: "SDES", "SPECREQUEST", "CULT", "REPTSTATUS"  Cardiac Enzymes: No results for input(s): "CKTOTAL", "CKMB", "CKMBINDEX", "TROPONINI" in the last 168 hours. CBG: Recent Labs  Lab 02/17/22 0741 02/17/22 1317 02/17/22 1621 02/17/22 2157 02/18/22 0823  GLUCAP 106* 118* 122* 182* 149*    Iron Studies: No results for input(s): "IRON", "TIBC", "TRANSFERRIN", "FERRITIN" in the last 72 hours. '@lablastinr3'$ @ Studies/Results: ECHO TEE  Result Date: 02/17/2022    TRANSESOPHOGEAL ECHO REPORT   Patient Name:   KIA STAVROS Burkett Date of Exam: 02/17/2022 Medical Rec #:  [id number]     Height:       69.0 in Accession #:    [id number]     Weight:       176.9 lb Date of Birth:  06-28-1936     BSA:          1.961 m Patient Age:    86 years      BP:           107/87 mmHg Patient Gender: M             HR:           70 bpm. Exam Location:  Inpatient Procedure: Transesophageal Echo, Cardiac Doppler and Color Doppler Indications:     Afib  History:         Patient has prior history of Echocardiogram examinations, most                  recent 02/09/2022. CHF; Risk Factors:Hypertension and                  Dyslipidemia.  Sonographer:     Darlina Sicilian RDCS Referring Phys:  [id number] Thana Farr SKAINS Diagnosing Phys: Candee Furbish MD PROCEDURE: After discussion of the risks and benefits of a TEE, an informed consent was obtained from the patient. TEE procedure time was 8 minutes. The transesophogeal probe was passed without difficulty through the esophogus of the  patient. Imaged were  obtained with the patient in a left lateral decubitus position. Sedation performed by different physician. The patient was monitored while under deep sedation. Anesthestetic sedation was provided intravenously by Anesthesiology: 191.'58mg'$  of Propofol, '100mg'$  of Lidocaine. Image quality was good. The patient's vital signs; including heart rate, blood pressure, and oxygen saturation; remained stable throughout the procedure. The patient developed no complications during the procedure. A successful direct  current cardioversion was performed at 200 joules with 1 attempt. IMPRESSIONS  1. Left ventricular ejection fraction, by estimation, is <20%. The left ventricle has severely decreased function. The left ventricle demonstrates global hypokinesis.  2. Right ventricular systolic function is moderately reduced. The right ventricular size is normal. There is normal pulmonary artery systolic pressure. The estimated right ventricular systolic pressure is 14.4 mmHg.  3. Left atrial size was severely dilated. No left atrial/left atrial appendage thrombus was detected.  4. Right atrial size was  severely dilated.  5. The mitral valve is normal in structure. Mild to moderate mitral valve regurgitation. No evidence of mitral stenosis.  6. Tricuspid valve regurgitation is severe.  7. The aortic valve is tricuspid. There is mild calcification of the aortic valve. Aortic valve regurgitation is mild. No aortic stenosis is present.  8. There is Moderate (Grade III) plaque.  9. The inferior vena cava is normal in size with greater than 50% respiratory variability, suggesting right atrial pressure of 3 mmHg. 10. Agitated saline contrast bubble study was negative, with no evidence of any interatrial shunt. Conclusion(s)/Recommendation(s): No LA/LAA thrombus identified. Successful cardioversion performed with restoration of normal sinus rhythm. FINDINGS  Left Ventricle: Left ventricular ejection fraction, by estimation, is <20%. The left ventricle has severely decreased function. The left ventricle demonstrates global hypokinesis. The left ventricular internal cavity size was normal in size. There is no  left ventricular hypertrophy. Right Ventricle: The right ventricular size is normal. No increase in right ventricular wall thickness. Right ventricular systolic function is moderately reduced. There is normal pulmonary artery systolic pressure. The tricuspid regurgitant velocity is 2.61 m/s, and with an assumed right atrial pressure of 3 mmHg, the estimated right ventricular systolic pressure is 31.5 mmHg. Left Atrium: Left atrial size was severely dilated. No left atrial/left atrial appendage thrombus was detected. Right Atrium: Right atrial size was severely dilated. Pericardium: There is no evidence of pericardial effusion. Mitral Valve: The mitral valve is normal in structure. Mild to moderate mitral valve regurgitation. No evidence of mitral valve stenosis. Tricuspid Valve: The tricuspid valve is normal in structure. Tricuspid valve regurgitation is severe. No evidence of tricuspid stenosis. Aortic Valve: The  aortic valve is tricuspid. There is mild calcification of the aortic valve. Aortic valve regurgitation is mild. No aortic stenosis is present. Pulmonic Valve: The pulmonic valve was normal in structure. Pulmonic valve regurgitation is mild. No evidence of pulmonic stenosis. Aorta: The aortic root is normal in size and structure. There is moderate (Grade III) plaque. Venous: The inferior vena cava is normal in size with greater than 50% respiratory variability, suggesting right atrial pressure of 3 mmHg. IAS/Shunts: No atrial level shunt detected by color flow Doppler. Agitated saline contrast was given intravenously to evaluate for intracardiac shunting. Agitated saline contrast bubble study was negative, with no evidence of any interatrial shunt. There  is no evidence of a patent foramen ovale. There is no evidence of an atrial septal defect.  TRICUSPID VALVE TR Peak grad:   27.2 mmHg TR Vmax:  261.00 cm/s Candee Furbish MD Electronically signed by Candee Furbish MD Signature Date/Time: 02/17/2022/9:49:50 AM    Final    US RENAL  Result Date: 02/16/2022 CLINICAL DATA:  Acute kidney injury EXAM: RENAL / URINARY TRACT ULTRASOUND COMPLETE COMPARISON:  CT abdomen and pelvis 10/02/2009 FINDINGS: Right Kidney: Renal measurements: 10.1 x 5.3 x 6.8 cm = volume: 162 mL. Echogenicity within normal limits. No mass or hydronephrosis visualized. Left Kidney: Renal measurements: 12.0 x 6.5 x 4.8 cm = volume: 125 mL. Echogenicity within normal limits. No hydronephrosis. There are 2 cysts in the left kidney measuring up to 1.4 cm. Bladder: Appears normal for degree of bladder distention. Other: Within the left lower quadrant there is a 4.1 x 4.5 x 4.7 cm cystic structure with some peripheral echogenic focal areas adjacent to the wall. This is indeterminate. IMPRESSION: 1. No hydronephrosis. 2. Small left renal cysts measuring up to 1.4 cm. 3. Indeterminate 4.7 cm cystic structure, mildly complex, in the left lower quadrant.  Recommend further evaluation with CT. Electronically Signed   By: Ronney Asters M.D.   On: 02/16/2022 19:33   DG Chest 2 View  Result Date: 02/16/2022 CLINICAL DATA:  Shortness of breath. EXAM: CHEST - 2 VIEW COMPARISON:  02/08/2022 FINDINGS: Stable cardiomegaly. Previously seen interstitial infiltrates have resolved. Mild scarring again noted in both upper lobes. Lungs are otherwise clear. Surgical clips again seen in the right lateral chest wall soft tissues. IMPRESSION: Stable cardiomegaly. No active lung disease. Electronically Signed   By: Marlaine Hind M.D.   On: 02/16/2022 10:18   Medications:  sodium chloride      amiodarone  400 mg Oral BID   aspirin EC  81 mg Oral Daily   atorvastatin  80 mg Oral Daily   dabigatran  150 mg Oral Q12H   diclofenac Sodium  4 g Topical QID   finasteride  5 mg Oral Daily   insulin aspart  0-5 Units Subcutaneous QHS   insulin aspart  0-9 Units Subcutaneous TID WC   linagliptin  5 mg Oral Daily   loratadine  10 mg Oral Daily   metoprolol succinate  50 mg Oral Daily   sodium chloride flush  3 mL Intravenous Q12H   sodium chloride flush  3 mL Intravenous Q12H    Assessment/Plan: CAULIN BEGLEY is an 86 y.o. male with A fib, type 2 DM, HTN, HL, h/o melanoma, h/o prostate cancer, OSA on CPAP currently admitted since 7/2 for unstable angina now s/p LHC x 2 without need for PCI who is being seen by nephrology for evaluation and management of AKI.     **AKI:  normal baseline renal function now with AKI peak Cr 3 on 7/10 following LHC x 2 and transient hypotension.   Suspect multifactorial contrast + hypoperfusion.   No obstruction on [place], UA not consistent with GN.   He appears euvolemic currently and I don't think volume expansion would be helpful at this time.  Kidney function improving with Cr 2.26 this AM.  I think this will continue to improve and I'd be ok with discharge.  Cont daily labs and I/Os while in.  Avoid nephrotoxins and hypovolemia.      **Hyperkalemia:  normal now s/p 1 dose of lokelma.    **Metabolic acidosis: secondary to AKI, improving   **CAD, unstable angina, HFrEF 25-30% --> now 15-20% with severe TR on echo 7/11:  being medically managed by cardiology   **Atrial fibrillation:  s/p DCCV 7/11, per cardiology.  converting to eliquis due to GFR  **Incidental L cystic structure noted on [place]: noncontrast CT abd/pelvis per rads rec.  Penile prosthesis etiology.    OK for d/c from my perspective.  PCP is Dr. Dennard Schaumann - would recommend he f/u with pt and check labs in the next few weeks.  Should kidney function remain abnormal we can see him but I think it's likely it'll return to baseline.  Please call with concerns.  I won't plan to see tomorrow but will review labs should he remain admitted to ensure continued improvement.    Jannifer Hick MD 02/18/2022, 8:34 AM  [facility] Pager: [phone]

## 2022-02-18 NOTE — Progress Notes (Signed)
? ?  Inpatient Rehab Admissions Coordinator : ? ?Per therapy recommendations, patient was screened for CIR candidacy by Ramone Gander RN MSN.  At this time patient appears to be a potential candidate for CIR. I will place a rehab consult per protocol for full assessment. Please call me with any questions. ? ?Kailen Hinkle RN MSN ?Admissions Coordinator ?[phone] ?  ?

## 2022-02-18 NOTE — Progress Notes (Addendum)
The patient has been seen in conjunction with Fabian Sharp, PAC. All aspects of care have been considered and discussed. The patient has been personally interviewed, examined, and all clinical data has been reviewed.  PT consultation will be obtained in preparation for discharge. Kidney function is slowly improving.  Nephrology signed off this morning. Blood pressure remains reasonable O2 saturation is reasonable We will make a decision about diuretic therapy.  He was not on a diuretic as an outpatient.  We will give him 1 more day of renal recovery before consideration of diuretic therapy. Perhaps the management strategy for volume in this patient would be SGLT2 therapy.  Progress Note  Patient Name: Antonio Clark Date of Encounter: 02/18/2022  [facility] Cardiologist: Candee Furbish, MD   Subjective   Pt resting with HOB elevated.  Still on [place] O2 - will need to wean this Has not been out of bed yet - will need to ambulate  Discussed potentially switching pradaxa to eliquis given renal function  JVD on exam - suspect he will need targeted doses of IV lasix, consider 60 mg IV today.  Inpatient Medications    Scheduled Meds:  amiodarone  400 mg Oral BID   aspirin EC  81 mg Oral Daily   atorvastatin  80 mg Oral Daily   dabigatran  150 mg Oral Q12H   diclofenac Sodium  4 g Topical QID   finasteride  5 mg Oral Daily   insulin aspart  0-5 Units Subcutaneous QHS   insulin aspart  0-9 Units Subcutaneous TID WC   linagliptin  5 mg Oral Daily   loratadine  10 mg Oral Daily   metoprolol succinate  50 mg Oral Daily   sodium chloride flush  3 mL Intravenous Q12H   sodium chloride flush  3 mL Intravenous Q12H   Continuous Infusions:  sodium chloride     PRN Meds: sodium chloride, acetaminophen, fluticasone, LORazepam, nitroGLYCERIN, ondansetron (ZOFRAN) IV, phenol, sodium chloride flush, traZODone   Vital Signs    Vitals:   02/17/22 1619 02/17/22 1950 02/18/22 0405  02/18/22 0551  BP: 121/64 (!) 108/49 106/63   Pulse: 62 67  68  Resp: '18 20 20 19  '$ Temp: 98.1 F (36.7 C)  97.6 F (36.4 C)   TempSrc: Oral  Oral   SpO2: 96% 96% 96% 97%  Weight:   80.6 kg   Height:        Intake/Output Summary (Last 24 hours) at 02/18/2022 0734 Last data filed at 02/18/2022 0400 Gross per 24 hour  Intake 357.21 ml  Output 400 ml  Net -42.79 ml      02/18/2022    4:05 AM 02/17/2022    6:45 AM 02/16/2022    4:59 AM  Last 3 Weights  Weight (lbs) 177 lb 9.6 oz 176 lb 14.4 oz 177 lb  Weight (kg) 80.559 kg 80.241 kg 80.287 kg      Telemetry    Sinus rhythm in the 60s - Personally Reviewed  ECG    No new tracings - Personally Reviewed  Physical Exam   GEN: No acute distress.   Neck: + JVD Cardiac: RRR, no murmurs, rubs, or gallops.  Respiratory: Clear to auscultation bilaterally. GI: Soft, nontender, non-distended  MS: No edema; No deformity. Neuro:  Nonfocal  Psych: Normal affect   Labs    High Sensitivity Troponin:   Recent Labs  Lab 02/08/22 1617 02/08/22 1844  TROPONINIHS 34* 35*     Chemistry  Recent Labs  Lab 02/16/22 1324 02/17/22 0608 02/18/22 0354  NA 135 135 136  K 5.6* 3.9 3.9  CL 100 104 108  CO2 18* 18* 20*  GLUCOSE 180* 128* 139*  BUN 68* 70* 61*  CREATININE 2.99* 2.59* 2.26*  CALCIUM 8.8* 8.0* 8.0*  ALBUMIN  --  2.9*  --   GFRNONAA 20* 24* 28*  ANIONGAP 17* 13 8    Lipids No results for input(s): "CHOL", "TRIG", "HDL", "LABVLDL", "LDLCALC", "CHOLHDL" in the last 168 hours.  Hematology Recent Labs  Lab 02/16/22 0120 02/17/22 0608 02/18/22 0354  WBC 9.8 6.6 6.4  RBC 4.22 3.88* 3.84*  HGB 12.1* 11.2* 11.2*  HCT 38.6* 34.3* 34.1*  MCV 91.5 88.4 88.8  MCH 28.7 28.9 29.2  MCHC 31.3 32.7 32.8  RDW 15.7* 15.7* 15.8*  PLT 111* 67* 84*   Thyroid No results for input(s): "TSH", "FREET4" in the last 168 hours.  BNPNo results for input(s): "BNP", "PROBNP" in the last 168 hours.  DDimer No results for input(s):  "DDIMER" in the last 168 hours.   Radiology    ECHO TEE  Result Date: 02/17/2022    TRANSESOPHOGEAL ECHO REPORT   Patient Name:   ARZELL MCGEEHAN Mccuen Date of Exam: 02/17/2022 Medical Rec #:  [id number]     Height:       69.0 in Accession #:    [id number]    Weight:       176.9 lb Date of Birth:  [birth year]     BSA:          1.961 m Patient Age:    86 years      BP:           107/87 mmHg Patient Gender: M             HR:           70 bpm. Exam Location:  Inpatient Procedure: Transesophageal Echo, Cardiac Doppler and Color Doppler Indications:     Afib  History:         Patient has prior history of Echocardiogram examinations, most                  recent 02/09/2022. CHF; Risk Factors:Hypertension and                  Dyslipidemia.  Sonographer:     Darlina Sicilian RDCS Referring Phys:  [id number] Thana Farr SKAINS Diagnosing Phys: Candee Furbish MD PROCEDURE: After discussion of the risks and benefits of a TEE, an informed consent was obtained from the patient. TEE procedure time was 8 minutes. The transesophogeal probe was passed without difficulty through the esophogus of the patient. Imaged were  obtained with the patient in a left lateral decubitus position. Sedation performed by different physician. The patient was monitored while under deep sedation. Anesthestetic sedation was provided intravenously by Anesthesiology: 191.'58mg'$  of Propofol, '100mg'$  of Lidocaine. Image quality was good. The patient's vital signs; including heart rate, blood pressure, and oxygen saturation; remained stable throughout the procedure. The patient developed no complications during the procedure. A successful direct  current cardioversion was performed at 200 joules with 1 attempt. IMPRESSIONS  1. Left ventricular ejection fraction, by estimation, is <20%. The left ventricle has severely decreased function. The left ventricle demonstrates global hypokinesis.  2. Right ventricular systolic function is moderately reduced. The right ventricular size is  normal. There is normal pulmonary artery systolic pressure. The estimated right ventricular systolic pressure is 70.3 mmHg.  3. Left atrial size was severely dilated. No left atrial/left atrial appendage thrombus was detected.  4. Right atrial size was severely dilated.  5. The mitral valve is normal in structure. Mild to moderate mitral valve regurgitation. No evidence of mitral stenosis.  6. Tricuspid valve regurgitation is severe.  7. The aortic valve is tricuspid. There is mild calcification of the aortic valve. Aortic valve regurgitation is mild. No aortic stenosis is present.  8. There is Moderate (Grade III) plaque.  9. The inferior vena cava is normal in size with greater than 50% respiratory variability, suggesting right atrial pressure of 3 mmHg. 10. Agitated saline contrast bubble study was negative, with no evidence of any interatrial shunt. Conclusion(s)/Recommendation(s): No LA/LAA thrombus identified. Successful cardioversion performed with restoration of normal sinus rhythm. FINDINGS  Left Ventricle: Left ventricular ejection fraction, by estimation, is <20%. The left ventricle has severely decreased function. The left ventricle demonstrates global hypokinesis. The left ventricular internal cavity size was normal in size. There is no  left ventricular hypertrophy. Right Ventricle: The right ventricular size is normal. No increase in right ventricular wall thickness. Right ventricular systolic function is moderately reduced. There is normal pulmonary artery systolic pressure. The tricuspid regurgitant velocity is 2.61 m/s, and with an assumed right atrial pressure of 3 mmHg, the estimated right ventricular systolic pressure is 70.0 mmHg. Left Atrium: Left atrial size was severely dilated. No left atrial/left atrial appendage thrombus was detected. Right Atrium: Right atrial size was severely dilated. Pericardium: There is no evidence of pericardial effusion. Mitral Valve: The mitral valve is normal in  structure. Mild to moderate mitral valve regurgitation. No evidence of mitral valve stenosis. Tricuspid Valve: The tricuspid valve is normal in structure. Tricuspid valve regurgitation is severe. No evidence of tricuspid stenosis. Aortic Valve: The aortic valve is tricuspid. There is mild calcification of the aortic valve. Aortic valve regurgitation is mild. No aortic stenosis is present. Pulmonic Valve: The pulmonic valve was normal in structure. Pulmonic valve regurgitation is mild. No evidence of pulmonic stenosis. Aorta: The aortic root is normal in size and structure. There is moderate (Grade III) plaque. Venous: The inferior vena cava is normal in size with greater than 50% respiratory variability, suggesting right atrial pressure of 3 mmHg. IAS/Shunts: No atrial level shunt detected by color flow Doppler. Agitated saline contrast was given intravenously to evaluate for intracardiac shunting. Agitated saline contrast bubble study was negative, with no evidence of any interatrial shunt. There  is no evidence of a patent foramen ovale. There is no evidence of an atrial septal defect.  TRICUSPID VALVE TR Peak grad:   27.2 mmHg TR Vmax:        261.00 cm/s Candee Furbish MD Electronically signed by Candee Furbish MD Signature Date/Time: 02/17/2022/9:49:50 AM    Final    US RENAL  Result Date: 02/16/2022 CLINICAL DATA:  Acute kidney injury EXAM: RENAL / URINARY TRACT ULTRASOUND COMPLETE COMPARISON:  CT abdomen and pelvis 10/02/2009 FINDINGS: Right Kidney: Renal measurements: 10.1 x 5.3 x 6.8 cm = volume: 162 mL. Echogenicity within normal limits. No mass or hydronephrosis visualized. Left Kidney: Renal measurements: 12.0 x 6.5 x 4.8 cm = volume: 125 mL. Echogenicity within normal limits. No hydronephrosis. There are 2 cysts in the left kidney measuring up to 1.4 cm. Bladder: Appears normal for degree of bladder distention. Other: Within the left lower quadrant there is a 4.1 x 4.5 x 4.7 cm cystic structure with some  peripheral echogenic focal areas adjacent to the  wall. This is indeterminate. IMPRESSION: 1. No hydronephrosis. 2. Small left renal cysts measuring up to 1.4 cm. 3. Indeterminate 4.7 cm cystic structure, mildly complex, in the left lower quadrant. Recommend further evaluation with CT. Electronically Signed   By: Ronney Asters M.D.   On: 02/16/2022 19:33   DG Chest 2 View  Result Date: 02/16/2022 CLINICAL DATA:  Shortness of breath. EXAM: CHEST - 2 VIEW COMPARISON:  02/08/2022 FINDINGS: Stable cardiomegaly. Previously seen interstitial infiltrates have resolved. Mild scarring again noted in both upper lobes. Lungs are otherwise clear. Surgical clips again seen in the right lateral chest wall soft tissues. IMPRESSION: Stable cardiomegaly. No active lung disease. Electronically Signed   By: Marlaine Hind M.D.   On: 02/16/2022 10:18    Cardiac Studies   Heart cath 02/13/22: Intravascular ultrasound demonstrates moderate eccentric calcific left main stenosis with a minimal lumen area of approximately 12 mm.  PCI is deferred.   IVUS findings: There is concentric LAD calcification with no stenosis.  There is mild nonobstructive plaquing in the distal and midportion of the left main.  There is moderate eccentric calcified stenosis at the ostium of the left main with a minimal lumen area of 12 mm.   Recommendations:  Discontinue clopidogrel Start amiodarone to control heart rate and facilitate cardioversion in this patient with severe LV dysfunction and atrial fibrillation Consider TEE cardioversion Monday if patient remains in atrial fibrillation Resume heparin in 6 hours    TEE-DCCV 02/17/22:  1. Left ventricular ejection fraction, by estimation, is <20%. The left  ventricle has severely decreased function. The left ventricle demonstrates  global hypokinesis.   2. Right ventricular systolic function is moderately reduced. The right  ventricular size is normal. There is normal pulmonary artery  systolic  pressure. The estimated right ventricular systolic pressure is 93.8 mmHg.   3. Left atrial size was severely dilated. No left atrial/left atrial  appendage thrombus was detected.   4. Right atrial size was severely dilated.   5. The mitral valve is normal in structure. Mild to moderate mitral valve  regurgitation. No evidence of mitral stenosis.   6. Tricuspid valve regurgitation is severe.   7. The aortic valve is tricuspid. There is mild calcification of the  aortic valve. Aortic valve regurgitation is mild. No aortic stenosis is  present.   8. There is Moderate (Grade III) plaque.   9. The inferior vena cava is normal in size with greater than 50%  respiratory variability, suggesting right atrial pressure of 3 mmHg.  10. Agitated saline contrast bubble study was negative, with no evidence  of any interatrial shunt.    Patient Profile     86 y.o. male persistent A-fib status post ablation in 04/2020 with recurrent A-fib status post DCCV in 05/2020 on Pradaxa, DM2, HTN, HLD, strong family history of CAD, melanoma, prostate cancer, and sleep apnea on CPAP who was admitted to [facility] on [facility]/08/2022 with  acute HFrEF (EF less than 20% by TEE 02/17/2022) and A-fib with RVR.  He underwent R/LHC on the afternoon of 02/11/2022 which showed severe ostial left main stenosis but IVUS showed MLA >12 millimeters square.  Subsequently underwent electrical cardioversion to sinus bradycardia on IV amiodarone which will be converted to oral therapy.  Assessment & Plan    Cardiomyopathy - mixed etiology Acute on Chronic systolic heart failure Previously LVEF normal in 2021, now < 20% on TEE. GDMT: toprol 50 mg daily Therapy limited by BP and renal function, will wait to  start SGLT2i until renal function back to baseline - need creatinine clearance of 25 ml/min for farxiga Hopefully will be able to add low dose entresto in the outpatient setting Weight is up to 177.6 lbs, from 170 at admission. JVD on  exam, suspect he may need 60 mg IV lasix OTO today. He is net positive, question I&Os   Atrial fibrillation with RVR Chronic anticoagulation TEE-DCCV 02/17/22 Home pradaxa was continued-  had been getting this for free at [facility] Given rising creatinine, consider switching to eliquis while renal function improves to reduce bleeding risk. Amiodarone transitioned to PO dosing - will continue 400 mg BID for 7 days, on 02/25/22 transition to 200 mg daily   AKI sCr 2.23 - baseline  near 1.0 Will need BMP in 1 week   CAD Hyperlipidemia with LDL goal < 70 Left main disease, no intervention as this was not obstructive Transitioned to 80 mg lipitor Continue ASA, no plavix given OAC   Elevated INR - suspect this may be error - will repeat this today    For questions or updates, please contact [facility] Please consult [email or web address] for contact info under        Signed, Ledora Bottcher, PA  02/18/2022, 7:34 AM

## 2022-02-18 NOTE — Progress Notes (Signed)
Mobility Specialist Progress Note    02/18/22 1152  Mobility  Activity Ambulated with assistance in hallway  Level of Assistance +2 (takes two people) (chair follow)  Assistive Device Front wheel walker  Distance Ambulated (ft) 180 ft (170+10)  Activity Response Tolerated well  $Mobility charge 1 Mobility   Pre-Mobility: 64 HR, 111/59 BP, 97% SpO2 During Mobility: 91% SpO2 Post-Mobility: 68 HR, 98% SpO2  Pt received in bed and agreeable. No complaints on walk. Maintained SpO2 >=90% on RA. Pt knees buckled, requiring seated rest break and being rolled back to room. Able to ambulate to chair once returned to doorway. Left with call bell in reach.    Hildred Alamin Mobility Specialist

## 2022-02-18 NOTE — Telephone Encounter (Signed)
Pharmacy Patient Advocate Encounter  Insurance verification completed.    The patient is insured through [employer]   The patient is currently admitted and ran test claims for the following: Eliquis, Xarelto.  Copays and coinsurance results were relayed to Inpatient clinical team.  

## 2022-02-18 NOTE — Progress Notes (Signed)
Patient reported some wheezing, shortness of breath, and difficulty feeling of needing to cough something up. Placed on 2L via nasal cannula and paged physician. New orders for 1 time nebulizer, incentive spirometer, and IV lasix. Patient reported breathing a bit better after neb. In discussion with the nurse, felt best to hold off on the IV lasix since drinking contrast for abdominal CT this AM and worried about kidney function.

## 2022-02-18 NOTE — TOC Benefit Eligibility Note (Signed)
Patient Teacher, English as a foreign language completed.    The patient is currently admitted and upon discharge could be taking Xarelto 20 mg.  The current 30 day co-pay is, $157.55.   The patient is currently admitted and upon discharge could be taking Eliquis 5 mg.  The current 30 day co-pay is, $162.86   The patient is insured through [place], [place] Patient [place] Patient Advocate Team Direct Number: [phone]  Fax: [phone]

## 2022-02-19 DIAGNOSIS — I5043 Acute on chronic combined systolic (congestive) and diastolic (congestive) heart failure: Secondary | ICD-10-CM

## 2022-02-19 DIAGNOSIS — R54 Age-related physical debility: Secondary | ICD-10-CM

## 2022-02-19 LAB — GLUCOSE, CAPILLARY
Glucose-Capillary: 123 mg/dL — ABNORMAL HIGH (ref 70–99)
Glucose-Capillary: 150 mg/dL — ABNORMAL HIGH (ref 70–99)
Glucose-Capillary: 151 mg/dL — ABNORMAL HIGH (ref 70–99)
Glucose-Capillary: 134 mg/dL — ABNORMAL HIGH (ref 70–99)

## 2022-02-19 LAB — BASIC METABOLIC PANEL
Anion gap: 6 (ref 5–15)
BUN: 50 mg/dL — ABNORMAL HIGH (ref 8–23)
CO2: 21 mmol/L — ABNORMAL LOW (ref 22–32)
Calcium: 8.1 mg/dL — ABNORMAL LOW (ref 8.9–10.3)
Chloride: 110 mmol/L (ref 98–111)
Creatinine, Ser: 1.97 mg/dL — ABNORMAL HIGH (ref 0.61–1.24)
GFR, Estimated: 33 mL/min — ABNORMAL LOW (ref >60)
Glucose, Bld: 136 mg/dL — ABNORMAL HIGH (ref 70–99)
Potassium: 3.6 mmol/L (ref 3.5–5.1)
Sodium: 137 mmol/L (ref 135–145)

## 2022-02-19 LAB — CBC
HCT: 31.9 % — ABNORMAL LOW (ref 39.0–52.0)
Hemoglobin: 10.5 g/dL — ABNORMAL LOW (ref 13.0–17.0)
MCH: 29.1 pg (ref 26.0–34.0)
MCHC: 32.9 g/dL (ref 30.0–36.0)
MCV: 88.4 fL (ref 80.0–100.0)
Platelets: 85 10*3/uL — ABNORMAL LOW (ref 150–400)
RBC: 3.61 MIL/uL — ABNORMAL LOW (ref 4.22–5.81)
RDW: 15.7 % — ABNORMAL HIGH (ref 11.5–15.5)
WBC: 5.7 10*3/uL (ref 4.0–10.5)
nRBC: 0 % (ref 0.0–0.2)

## 2022-02-19 MED ORDER — POTASSIUM CHLORIDE CRYS ER 20 MEQ PO TBCR
20.0000 meq | EXTENDED_RELEASE_TABLET | Freq: Once | ORAL | Status: AC
Start: 2022-02-19 — End: 2022-02-19
  Administered 2022-02-19: 20 meq via ORAL
  Filled 2022-02-19: qty 1

## 2022-02-19 NOTE — Care Management Important Message (Signed)
Important Message  Patient Details  Name: SNEIJDER BERNARDS MRN: [id number] Date of Birth: 08-12-1935   Medicare Important Message Given:  Yes     Shelda Altes 02/19/2022, 9:07 AM

## 2022-02-19 NOTE — Progress Notes (Signed)
Inpatient Rehab Admissions:  Inpatient Rehab Consult received.  I met with patient and caregiver [name] at the bedside for rehabilitation assessment and to discuss goals and expectations of an inpatient rehab admission.  Both acknowledged understanding of CIR goals and expectations. Pt interested in pursuing CIR and Kim supportive. [name] confirmed that she can provide support after discharge. Will continue to follow.  Signed: Gayland Curry, [place], [place] Admissions Coordinator [phone]

## 2022-02-19 NOTE — Progress Notes (Signed)
Physical Therapy Treatment Patient Details Name: Antonio Clark MRN: [id number] DOB: 1935-09-30 Today's Date: 02/19/2022   History of Present Illness pt is an 86 y/o male admitted 7/5 from [place] acute HFrEF/afib with RVR and having underwent R/L HC on 7/5 showing severe ostial L main stenosis, it was recommended to transfer to [facility] for revascularization.  s/p TEE cardioversion 7/11.  PMHx:  Afib, DM2, HTN, prostate CA, ablation, coronary angiography,    PT Comments    Pt has improved since yesterday, but not at baseline and not able to live alone without some assist.  Emphasis on transitions,  sit to stand safety and progression of gait stability and quality with the RW.    Recommendations for follow up therapy are one component of a multi-disciplinary discharge planning process, led by the attending physician.  Recommendations may be updated based on patient status, additional functional criteria and insurance authorization.  Follow Up Recommendations  Acute inpatient rehab (3hours/day)     Assistance Recommended at Discharge Intermittent Supervision/Assistance  Patient can return home with the following A little help with walking and/or transfers;A little help with bathing/dressing/bathroom;Assistance with cooking/housework;Assist for transportation;Help with stairs or ramp for entrance   Equipment Recommendations  None recommended by PT    Recommendations for Other Services       Precautions / Restrictions Precautions Precautions: Fall Restrictions Weight Bearing Restrictions: No     Mobility  Bed Mobility Overal bed mobility: Needs Assistance Bed Mobility: Supine to Sit, Sit to Supine     Supine to sit: Supervision Sit to supine: Supervision   General bed mobility comments: no assist needed.    Transfers Overall transfer level: Needs assistance Equipment used: Rolling walker (2 wheels) Transfers: Sit to/from Stand Sit to Stand: Min assist            General transfer comment: still needs stability for posterior bias.    Ambulation/Gait Ambulation/Gait assistance: Min assist Gait Distance (Feet): 120 Feet (x2) Assistive device: Rolling walker (2 wheels) Gait Pattern/deviations: Step-through pattern   Gait velocity interpretation: <1.8 ft/sec, indicate of risk for recurrent falls   General Gait Details: mildly unsteady with mild L foot slap, tendency to flex posture and fall back behind the RW. Drift better today   Marine scientist [place] (Stroke Patients Only)       Balance Overall balance assessment: Needs assistance Sitting-balance support: Feet unsupported Sitting balance-Leahy Scale: Good     Standing balance support: No upper extremity supported Standing balance-Leahy Scale: Poor (progressing toward fair)                              Cognition Arousal/Alertness: Awake/alert Behavior During Therapy: WFL for tasks assessed/performed Overall Cognitive Status: Within Functional Limits for tasks assessed                                          Exercises      General Comments General comments (skin integrity, edema, etc.): vss  mid 90's on RA with good pleth      Pertinent Vitals/Pain      Home Living Family/patient expects to be discharged to:: Private residence Living Arrangements: Alone Available Help at Discharge: Friend(s);Available PRN/intermittently Type of Home: House Home Access: Stairs to enter  Entrance Stairs-Rails: None Entrance Stairs-Number of Steps: 3 Alternate Level Stairs-Number of Steps: flight Home Layout: Multi-level;Able to live on main level with bedroom/bathroom Home Equipment: [place] (2 wheels)      Prior Function            PT Goals (current goals can now be found in the care plan section) Acute Rehab PT Goals Patient Stated Goal: go home and get walking like I used to. PT Goal  Formulation: With patient Time For Goal Achievement: 03/04/22 Potential to Achieve Goals: Good Progress towards PT goals: Progressing toward goals    Frequency    Min 3X/week      PT Plan Current plan remains appropriate    Co-evaluation              AM-PAC PT "6 Clicks" Mobility   Outcome Measure  Help needed turning from your back to your side while in a flat bed without using bedrails?: A Little Help needed moving from lying on your back to sitting on the side of a flat bed without using bedrails?: A Little Help needed moving to and from a bed to a chair (including a wheelchair)?: A Little Help needed standing up from a chair using your arms (e.g., wheelchair or bedside chair)?: A Little Help needed to walk in hospital room?: A Little Help needed climbing 3-5 steps with a railing? : A Lot 6 Click Score: 17    End of Session   Activity Tolerance: Patient tolerated treatment well Patient left: in bed;with call bell/phone within reach;with family/visitor present Nurse Communication: Mobility status PT Visit Diagnosis: Unsteadiness on feet (R26.81);Difficulty in walking, not elsewhere classified (R26.2)     Time: 9357-0177 PT Time Calculation (min) (ACUTE ONLY): 20 min  Charges:  $Gait Training: 8-22 mins                     02/19/2022  Ginger Carne., PT Acute Rehabilitation Services [phone]  (pager) [phone]  (office)   Tessie Fass Jerson Furukawa 02/19/2022, 3:47 PM

## 2022-02-19 NOTE — Plan of Care (Signed)

## 2022-02-19 NOTE — Progress Notes (Addendum)
The patient has been seen in conjunction with Fabian Sharp, PA-C. All aspects of care have been considered and discussed. The patient has been personally interviewed, examined, and all clinical data has been reviewed.  Somewhat frail and physically unstable. Slow gradual improvement in creatinine. Determine appropriate discharge plan.  I would favor CIR which would give him the greatest opportunity to remain independent at home.   We will hold loop diuretics while kidney function is improving.  Wilder Glade addition led to -1400 cc yesterday. Clinically volume overloaded with rales and JVD. Disposition depending upon physical therapy/CIR recommendations.  Progress Note  Patient Name: Antonio Clark Date of Encounter: 02/19/2022  [facility] Cardiologist: Candee Furbish, MD   Subjective   Pt found resting nearly flat, off of [place] O2. He feels well and was able to walk the halls yesterday. Is being evaluated for CIR, I agree with this.   Inpatient Medications    Scheduled Meds:  amiodarone  400 mg Oral BID   apixaban  2.5 mg Oral BID   aspirin EC  81 mg Oral Daily   atorvastatin  80 mg Oral Daily   dapagliflozin propanediol  10 mg Oral Daily   diclofenac Sodium  4 g Topical QID   finasteride  5 mg Oral Daily   insulin aspart  0-5 Units Subcutaneous QHS   insulin aspart  0-9 Units Subcutaneous TID WC   linagliptin  5 mg Oral Daily   loratadine  10 mg Oral Daily   metoprolol succinate  50 mg Oral Daily   sodium chloride flush  3 mL Intravenous Q12H   sodium chloride flush  3 mL Intravenous Q12H   Continuous Infusions:  sodium chloride     PRN Meds: sodium chloride, acetaminophen, fluticasone, LORazepam, nitroGLYCERIN, ondansetron (ZOFRAN) IV, phenol, sodium chloride flush, traZODone   Vital Signs    Vitals:   02/18/22 0551 02/18/22 0814 02/18/22 2124 02/19/22 0530  BP:  126/66 120/67 123/73  Pulse: 68 66 65 67  Resp: '19 18 19 19  '$ Temp:  98.2 F (36.8 C) 98 F (36.7 C) 98.2  F (36.8 C)  TempSrc:  Oral Oral Oral  SpO2: 97% 100% 99% 96%  Weight:    86.3 kg  Height:        Intake/Output Summary (Last 24 hours) at 02/19/2022 0739 Last data filed at 02/19/2022 0532 Gross per 24 hour  Intake 240 ml  Output 1650 ml  Net -1410 ml      02/19/2022    5:30 AM 02/18/2022    4:05 AM 02/17/2022    6:45 AM  Last 3 Weights  Weight (lbs) 190 lb 4.1 oz 177 lb 9.6 oz 176 lb 14.4 oz  Weight (kg) 86.3 kg 80.559 kg 80.241 kg      Telemetry    Sinus-sinus bradycardia 50-60s - Personally Reviewed  ECG    No new tracings - Personally Reviewed  Physical Exam   GEN: No acute distress.   Neck: mild JVD Cardiac: RRR, no murmurs, rubs, or gallops.  Respiratory: Clear to auscultation bilaterally, diminished in bases GI: Soft, nontender, non-distended  MS: No edema; No deformity. Neuro:  Nonfocal  Psych: Normal affect   Labs    High Sensitivity Troponin:   Recent Labs  Lab 02/08/22 1617 02/08/22 1844  TROPONINIHS 34* 35*     Chemistry Recent Labs  Lab 02/17/22 0608 02/18/22 0354 02/19/22 0204  NA 135 136 137  K 3.9 3.9 3.6  CL 104 108 110  CO2 18* 20* 21*  GLUCOSE 128* 139* 136*  BUN 70* 61* 50*  CREATININE 2.59* 2.26* 1.97*  CALCIUM 8.0* 8.0* 8.1*  ALBUMIN 2.9*  --   --   GFRNONAA 24* 28* 33*  ANIONGAP '13 8 6    '$ Lipids No results for input(s): "CHOL", "TRIG", "HDL", "LABVLDL", "LDLCALC", "CHOLHDL" in the last 168 hours.  Hematology Recent Labs  Lab 02/17/22 0608 02/18/22 0354 02/19/22 0204  WBC 6.6 6.4 5.7  RBC 3.88* 3.84* 3.61*  HGB 11.2* 11.2* 10.5*  HCT 34.3* 34.1* 31.9*  MCV 88.4 88.8 88.4  MCH 28.9 29.2 29.1  MCHC 32.7 32.8 32.9  RDW 15.7* 15.8* 15.7*  PLT 67* 84* 85*   Thyroid No results for input(s): "TSH", "FREET4" in the last 168 hours.  BNPNo results for input(s): "BNP", "PROBNP" in the last 168 hours.  DDimer No results for input(s): "DDIMER" in the last 168 hours.   Radiology    CT ABDOMEN PELVIS WO  CONTRAST  Result Date: 02/18/2022 CLINICAL DATA:  Evaluate left lower quadrant cystic structure seen on recent ultrasound. EXAM: CT ABDOMEN AND PELVIS WITHOUT CONTRAST TECHNIQUE: Multidetector CT imaging of the abdomen and pelvis was performed following the standard protocol without IV contrast. RADIATION DOSE REDUCTION: This exam was performed according to the departmental dose-optimization program which includes automated exposure control, adjustment of the mA and/or kV according to patient size and/or use of iterative reconstruction technique. COMPARISON:  Renal ultrasound 02/16/2022 and remote CT scan from 2011 FINDINGS: Lower chest: Small bilateral pleural effusions with overlying atelectasis. The heart is within normal limits in size for age. Aortic and coronary artery calcifications are noted. Hepatobiliary: No hepatic lesions or intrahepatic biliary dilatation. The gallbladder contains layering gallstones dependently. No findings for acute cholecystitis. No common bile duct dilatation. Pancreas: Prominent fatty interstices but no mass, inflammation or ductal dilatation. Spleen: Normal size.  No focal lesions. Adrenals/Urinary Tract: Adrenal glands are normal. Suspect bilateral parapelvic renal cysts. Lower pole right renal calculus. No obstructing ureteral calculi or hydroureter. No worrisome renal lesions are identified without contrast. The bladder is unremarkable. Stomach/Bowel: The stomach, duodenum, small and colon are unremarkable. No acute inflammatory process, mass lesions or obstructive findings. The terminal ileum and appendix are normal. Vascular/Lymphatic: Advanced atherosclerotic calcification involving the aorta and branch vessel ostia. No aneurysm. No mesenteric or retroperitoneal mass or adenopathy. Reproductive: Brachytherapy seeds are noted in the prostate gland. The seminal vesicles are unremarkable. The patient has a penile prosthesis with a reservoir or in the left anterior pelvis.  This correlates with the abnormality seen on the ultrasound examination. Other: Small amount of free pelvic fluid is noted on left. Musculoskeletal: No significant bony findings. Remote appearing L2 compression fracture IMPRESSION: 1. Small bilateral pleural effusions with overlying atelectasis. 2. Cholelithiasis. 3. Suspect bilateral parapelvic renal cysts. 4. Lower pole right renal calculus. 5. Advanced atherosclerotic calcification involving the aorta and branch vessels. 6. Brachytherapy seeds in the prostate gland. 7. Penile prosthesis with a reservoir or in the left anterior pelvis. This correlates with the abnormality seen on the ultrasound examination. Aortic Atherosclerosis (ICD10-I70.0). Electronically Signed   By: Marijo Sanes M.D.   On: 02/18/2022 09:01   ECHO TEE  Result Date: 02/17/2022    TRANSESOPHOGEAL ECHO REPORT   Patient Name:   Antonio Clark Date of Exam: 02/17/2022 Medical Rec #:  [id number]     Height:       69.0 in Accession #:    [id number]    Weight:  176.9 lb Date of Birth:  01/28/1936     BSA:          1.961 m Patient Age:    40 years      BP:           107/87 mmHg Patient Gender: M             HR:           70 bpm. Exam Location:  Inpatient Procedure: Transesophageal Echo, Cardiac Doppler and Color Doppler Indications:     Afib  History:         Patient has prior history of Echocardiogram examinations, most                  recent 02/09/2022. CHF; Risk Factors:Hypertension and                  Dyslipidemia.  Sonographer:     Darlina Sicilian RDCS Referring Phys:  [id number] Thana Farr SKAINS Diagnosing Phys: Candee Furbish MD PROCEDURE: After discussion of the risks and benefits of a TEE, an informed consent was obtained from the patient. TEE procedure time was 8 minutes. The transesophogeal probe was passed without difficulty through the esophogus of the patient. Imaged were  obtained with the patient in a left lateral decubitus position. Sedation performed by different physician. The patient was  monitored while under deep sedation. Anesthestetic sedation was provided intravenously by Anesthesiology: 191.'58mg'$  of Propofol, '100mg'$  of Lidocaine. Image quality was good. The patient's vital signs; including heart rate, blood pressure, and oxygen saturation; remained stable throughout the procedure. The patient developed no complications during the procedure. A successful direct  current cardioversion was performed at 200 joules with 1 attempt. IMPRESSIONS  1. Left ventricular ejection fraction, by estimation, is <20%. The left ventricle has severely decreased function. The left ventricle demonstrates global hypokinesis.  2. Right ventricular systolic function is moderately reduced. The right ventricular size is normal. There is normal pulmonary artery systolic pressure. The estimated right ventricular systolic pressure is 95.1 mmHg.  3. Left atrial size was severely dilated. No left atrial/left atrial appendage thrombus was detected.  4. Right atrial size was severely dilated.  5. The mitral valve is normal in structure. Mild to moderate mitral valve regurgitation. No evidence of mitral stenosis.  6. Tricuspid valve regurgitation is severe.  7. The aortic valve is tricuspid. There is mild calcification of the aortic valve. Aortic valve regurgitation is mild. No aortic stenosis is present.  8. There is Moderate (Grade III) plaque.  9. The inferior vena cava is normal in size with greater than 50% respiratory variability, suggesting right atrial pressure of 3 mmHg. 10. Agitated saline contrast bubble study was negative, with no evidence of any interatrial shunt. Conclusion(s)/Recommendation(s): No LA/LAA thrombus identified. Successful cardioversion performed with restoration of normal sinus rhythm. FINDINGS  Left Ventricle: Left ventricular ejection fraction, by estimation, is <20%. The left ventricle has severely decreased function. The left ventricle demonstrates global hypokinesis. The left ventricular internal  cavity size was normal in size. There is no  left ventricular hypertrophy. Right Ventricle: The right ventricular size is normal. No increase in right ventricular wall thickness. Right ventricular systolic function is moderately reduced. There is normal pulmonary artery systolic pressure. The tricuspid regurgitant velocity is 2.61 m/s, and with an assumed right atrial pressure of 3 mmHg, the estimated right ventricular systolic pressure is 88.4 mmHg. Left Atrium: Left atrial size was severely dilated. No left atrial/left atrial appendage thrombus was detected.  Right Atrium: Right atrial size was severely dilated. Pericardium: There is no evidence of pericardial effusion. Mitral Valve: The mitral valve is normal in structure. Mild to moderate mitral valve regurgitation. No evidence of mitral valve stenosis. Tricuspid Valve: The tricuspid valve is normal in structure. Tricuspid valve regurgitation is severe. No evidence of tricuspid stenosis. Aortic Valve: The aortic valve is tricuspid. There is mild calcification of the aortic valve. Aortic valve regurgitation is mild. No aortic stenosis is present. Pulmonic Valve: The pulmonic valve was normal in structure. Pulmonic valve regurgitation is mild. No evidence of pulmonic stenosis. Aorta: The aortic root is normal in size and structure. There is moderate (Grade III) plaque. Venous: The inferior vena cava is normal in size with greater than 50% respiratory variability, suggesting right atrial pressure of 3 mmHg. IAS/Shunts: No atrial level shunt detected by color flow Doppler. Agitated saline contrast was given intravenously to evaluate for intracardiac shunting. Agitated saline contrast bubble study was negative, with no evidence of any interatrial shunt. There  is no evidence of a patent foramen ovale. There is no evidence of an atrial septal defect.  TRICUSPID VALVE TR Peak grad:   27.2 mmHg TR Vmax:        261.00 cm/s Candee Furbish MD Electronically signed by Candee Furbish MD Signature Date/Time: 02/17/2022/9:49:50 AM    Final     Cardiac Studies   Heart cath 02/13/22: Intravascular ultrasound demonstrates moderate eccentric calcific left main stenosis with a minimal lumen area of approximately 12 mm.  PCI is deferred.   IVUS findings: There is concentric LAD calcification with no stenosis.  There is mild nonobstructive plaquing in the distal and midportion of the left main.  There is moderate eccentric calcified stenosis at the ostium of the left main with a minimal lumen area of 12 mm.   Recommendations:  Discontinue clopidogrel Start amiodarone to control heart rate and facilitate cardioversion in this patient with severe LV dysfunction and atrial fibrillation Consider TEE cardioversion Monday if patient remains in atrial fibrillation Resume heparin in 6 hours       TEE-DCCV 02/17/22:  1. Left ventricular ejection fraction, by estimation, is <20%. The left  ventricle has severely decreased function. The left ventricle demonstrates  global hypokinesis.   2. Right ventricular systolic function is moderately reduced. The right  ventricular size is normal. There is normal pulmonary artery systolic  pressure. The estimated right ventricular systolic pressure is 41.3 mmHg.   3. Left atrial size was severely dilated. No left atrial/left atrial  appendage thrombus was detected.   4. Right atrial size was severely dilated.   5. The mitral valve is normal in structure. Mild to moderate mitral valve  regurgitation. No evidence of mitral stenosis.   6. Tricuspid valve regurgitation is severe.   7. The aortic valve is tricuspid. There is mild calcification of the  aortic valve. Aortic valve regurgitation is mild. No aortic stenosis is  present.   8. There is Moderate (Grade III) plaque.   9. The inferior vena cava is normal in size with greater than 50%  respiratory variability, suggesting right atrial pressure of 3 mmHg.  10. Agitated saline contrast  bubble study was negative, with no evidence  of any interatrial shunt.   Patient Profile     86 y.o. male persistent A-fib status post ablation in 04/2020 with recurrent A-fib status post DCCV in 05/2020 on Pradaxa, DM2, HTN, HLD, strong family history of CAD, melanoma, prostate cancer, and sleep apnea on  CPAP who was admitted to [facility] on 02/08/2022 with  acute HFrEF (EF less than 20% by TEE 02/17/2022) and A-fib with RVR.  He underwent R/LHC on the afternoon of 02/11/2022 which showed severe ostial left main stenosis but IVUS showed MLA >12 millimeters square.  Subsequently underwent electrical cardioversion to sinus bradycardia on IV amiodarone which will be converted to oral therapy.    Assessment & Plan    Cardiomyopathy - mixed etiology Acute on Chronic systolic heart failure AKI Previously LVEF normal in 2021, now < 20% on TEE. GDMT: toprol 50 mg daily, farxiga 10 mg daily Therapy has been limited by BP and renal function.  sCr trending down - 1.97 - baseline 1.0 BMP 1 week after discharge Hopefully will be able to add low dose entresto outpatient Weight is up to 190 lbs, from 170 lbs at admission - will recheck this He is overall net positive, but 1.4 L urine output yesterday. Does not appear significantly volume up today. Will continue to monitor with addition of farxiga.   Atrial fibrillation with RVR Chronic anticoagulation TEE-DCCV 02/17/22 Home pradaxa was transitioned to eliquis for renal function and elevated INR Amiodarone transitioned to PO dosing - continue 400 mg BID x 7 days, on 02/25/22 transition to 200 mg daily.   CAD Hyperlipidemia with LDL goal < 70 Left main disease, no intervention as this was not obstructive Transitioned to 80 mg lipitor Continue ASA, no plavix given [place]   Disposition He is likely a candidate for CIR, awaiting full consult by rehab medicine.  If he goes to CIR, will draw a BMP every 3 days - as creatinine normalizes, will likely be able to  send him out on his home pradaxa.    For questions or updates, please contact [facility] Please consult [email or web address] for contact info under        Signed, Ledora Bottcher, PA  02/19/2022, 7:39 AM

## 2022-02-19 NOTE — Progress Notes (Signed)
Mobility Specialist Progress Note    02/19/22 1114  Mobility  Activity Ambulated with assistance in hallway  Level of Assistance Minimal assist, patient does 75% or more  Assistive Device Front wheel walker  Distance Ambulated (ft) 340 ft  Activity Response Tolerated well  $Mobility charge 1 Mobility   Pre-Mobility: 65 HR, 98% SpO2  Pt received in bed and agreeable. Needing cues for hand placement and to keep the RW closer. X1 minor LOB requiring minA to correct. No complaints. Took a few short standing rest breaks and pt legs started to wobble upon return to room. Left in chair with call bell in reach. Spoke w/ RN about allowing pt to use BR w/ assist.   Hildred Alamin Mobility Specialist

## 2022-02-19 NOTE — Evaluation (Signed)
Occupational Therapy Evaluation Patient Details Name: Antonio Clark MRN: [id number] DOB: March 10, 1936 Today's Date: 02/19/2022   History of Present Illness pt is an 86 y/o male admitted 7/5 from [place] acute HFrEF/afib with RVR and having underwent R/L HC on 7/5 showing severe ostial L main stenosis, it was recommended to transfer to [facility] for revascularization.  s/p TEE cardioversion 7/11.  PMHx:  Afib, DM2, HTN, prostate CA, ablation, coronary angiography,   Clinical Impression   Patient admitted for the diagnosis above.  PTA he lives alone with no support from family.  He remains very active, continues to cut his grass, drives, and needed no assist with any aspect of his care.  Currently he is presenting with generalized weakness, poor dynamic balance, and fair activity tolerance. All of which are impacting his independence, needing up to [place] for ADL completion at sit/stand level, and occasional Min A for balance during dynamic tasks.  OT will follow in the acute setting, and AIR is recommended for post acute rehab to maximize his functional status prior to returning home.  The patient can, and would benefit from 3+ hours of multi disciplined rehab approach.         Recommendations for follow up therapy are one component of a multi-disciplinary discharge planning process, led by the attending physician.  Recommendations may be updated based on patient status, additional functional criteria and insurance authorization.   Follow Up Recommendations  Acute inpatient rehab (3hours/day)    Assistance Recommended at Discharge Frequent or constant Supervision/Assistance  Patient can return home with the following A little help with walking and/or transfers;A little help with bathing/dressing/bathroom;Help with stairs or ramp for entrance;Assist for transportation;Assistance with cooking/housework    Functional Status Assessment  Patient has had a recent decline in their functional status and  demonstrates the ability to make significant improvements in function in a reasonable and predictable amount of time.  Equipment Recommendations  Tub/shower seat    Recommendations for Other Services       Precautions / Restrictions Precautions Precautions: Fall Restrictions Weight Bearing Restrictions: No      Mobility Bed Mobility Overal bed mobility: Needs Assistance Bed Mobility: Supine to Sit, Sit to Supine     Supine to sit: Supervision Sit to supine: Supervision        Transfers Overall transfer level: Needs assistance   Transfers: Sit to/from Stand, Bed to chair/wheelchair/BSC Sit to Stand: Min assist Stand pivot transfers: Min assist         General transfer comment: loss of balance backward with any challenges to balance.      Balance Overall balance assessment: Needs assistance Sitting-balance support: Feet unsupported Sitting balance-Leahy Scale: Good     Standing balance support: No upper extremity supported Standing balance-Leahy Scale: Poor                             ADL either performed or assessed with clinical judgement   ADL Overall ADL's : Needs assistance/impaired Eating/Feeding: Independent;Sitting   Grooming: Min guard;Standing   Upper Body Bathing: Set up;Sitting   Lower Body Bathing: Minimal assistance;Sit to/from stand   Upper Body Dressing : Sitting;Set up   Lower Body Dressing: Minimal assistance;Sit to/from stand   Toilet Transfer: Minimal assistance;Ambulation                   Vision Patient Visual Report: No change from baseline       Perception Perception  Perception: Within Functional Limits   Praxis Praxis Praxis: Intact    Pertinent Vitals/Pain Pain Assessment Pain Assessment: No/denies pain     Hand Dominance Right   Extremity/Trunk Assessment Upper Extremity Assessment Upper Extremity Assessment: Generalized weakness (No restrictions to AROM)   Lower Extremity  Assessment Lower Extremity Assessment: Defer to PT evaluation   Cervical / Trunk Assessment Cervical / Trunk Assessment: Normal   Communication Communication Communication: No difficulties   Cognition Arousal/Alertness: Awake/alert Behavior During Therapy: WFL for tasks assessed/performed Overall Cognitive Status: Within Functional Limits for tasks assessed                                       General Comments   Desaturated on RA to 89% after activity.      Exercises     Shoulder Instructions      Home Living Family/patient expects to be discharged to:: Private residence Living Arrangements: Alone Available Help at Discharge: Friend(s);Available PRN/intermittently Type of Home: House Home Access: Stairs to enter [employer] of Steps: 3 Entrance Stairs-Rails: None Home Layout: Multi-level;Able to live on main level with bedroom/bathroom Alternate Level Stairs-Number of Steps: flight Alternate Level Stairs-Rails: Left;Right Bathroom Shower/Tub: Occupational psychologist: Handicapped height Bathroom Accessibility: Yes How Accessible: Accessible via walker Home Equipment: [place] (2 wheels)          Prior Functioning/Environment Prior Level of Function : Independent/Modified Independent             Mobility Comments: mod I lately with quad cane, more difficulty lately getting out of his softly padded chairs ADLs Comments: Ind with ADL/iADL, driving.  No assist with home management, still cutting his grass.  No assist with bill payment or med management.        OT Problem List: Decreased strength;Decreased activity tolerance;Impaired balance (sitting and/or standing);Decreased knowledge of use of DME or AE      OT Treatment/Interventions: Self-care/ADL training;Therapeutic exercise;Therapeutic activities;Patient/family education;Balance training;DME and/or AE instruction    OT Goals(Current goals can be  found in the care plan section) Acute Rehab OT Goals Patient Stated Goal: I want to be independent like I was at home OT Goal Formulation: With patient Time For Goal Achievement: 03/05/22 Potential to Achieve Goals: Good ADL Goals Pt Will Perform Grooming: with modified independence;standing Pt Will Perform Lower Body Dressing: with modified independence;sit to/from stand Pt Will Transfer to Toilet: with modified independence;ambulating Pt/caregiver will Perform Home Exercise Program: Increased strength;Both right and left upper extremity;With theraband;With written HEP provided  OT Frequency: Min 2X/week    Co-evaluation              AM-PAC OT "6 Clicks" Daily Activity     Outcome Measure Help from another person eating meals?: None Help from another person taking care of personal grooming?: None Help from another person toileting, which includes using toliet, bedpan, or urinal?: A Little Help from another person bathing (including washing, rinsing, drying)?: A Little Help from another person to put on and taking off regular upper body clothing?: A Little Help from another person to put on and taking off regular lower body clothing?: A Little 6 Click Score: 20   End of Session Equipment Utilized During Treatment: Gait belt Nurse Communication: Mobility status  Activity Tolerance: Patient tolerated treatment well Patient left: in bed;with call bell/phone within reach;with family/visitor present  OT Visit Diagnosis: Unsteadiness on  feet (R26.81);Muscle weakness (generalized) (M62.81)                Time: [id number] OT Time Calculation (min): 24 min Charges:  OT General Charges $OT Visit: 1 Visit OT Evaluation $OT Eval Moderate Complexity: 1 Mod OT Treatments $Self Care/Home Management : 8-22 mins  02/19/2022  RP, OTR/L  Acute Rehabilitation Services  Office:  [phone]   Metta Clines 02/19/2022, 2:09 PM

## 2022-02-19 NOTE — TOC Progression Note (Signed)
Transition of Care [facility]) - Progression Note    Patient Details  Name: Antonio Clark MRN: [id number] Date of Birth: February 23, 1936  Transition of Care [facility]) CM/SW Contact  Graves-Bigelow, Ocie Cornfield, RN Phone Number: 02/19/2022, 3:09 PM  Clinical Narrative:  Case Manager continuing to follow the patient for disposition needs. Inpatient Rehab Coordinator following the patient for possible CIR admission. Case Manager following for disposition needs.   Expected Discharge Plan: IP Rehab Facility Barriers to Discharge: Continued Medical Work up  Expected Discharge Plan and Services Expected Discharge Plan: [place] In-house Referral: NA Discharge Planning Services: CM Consult Post Acute Care Choice: [place] arrangements for the past 2 months: Single Family Home                   DME Agency: NA     Readmission Risk Interventions    02/16/2022    3:10 PM  Readmission Risk Prevention Plan  Transportation Screening Complete  PCP or Specialist Appt within 5-7 Days Complete  Home Care Screening Complete  Medication Review (RN CM) Referral to Pharmacy

## 2022-02-20 DIAGNOSIS — N179 Acute kidney failure, unspecified: Secondary | ICD-10-CM

## 2022-02-20 DIAGNOSIS — I5023 Acute on chronic systolic (congestive) heart failure: Secondary | ICD-10-CM

## 2022-02-20 LAB — BASIC METABOLIC PANEL
Anion gap: 8 (ref 5–15)
BUN: 36 mg/dL — ABNORMAL HIGH (ref 8–23)
CO2: 20 mmol/L — ABNORMAL LOW (ref 22–32)
Calcium: 8.3 mg/dL — ABNORMAL LOW (ref 8.9–10.3)
Chloride: 111 mmol/L (ref 98–111)
Creatinine, Ser: 1.53 mg/dL — ABNORMAL HIGH (ref 0.61–1.24)
GFR, Estimated: 45 mL/min — ABNORMAL LOW (ref >60)
Glucose, Bld: 112 mg/dL — ABNORMAL HIGH (ref 70–99)
Potassium: 4 mmol/L (ref 3.5–5.1)
Sodium: 139 mmol/L (ref 135–145)

## 2022-02-20 LAB — GLUCOSE, CAPILLARY
Glucose-Capillary: 110 mg/dL — ABNORMAL HIGH (ref 70–99)
Glucose-Capillary: 179 mg/dL — ABNORMAL HIGH (ref 70–99)
Glucose-Capillary: 111 mg/dL — ABNORMAL HIGH (ref 70–99)
Glucose-Capillary: 136 mg/dL — ABNORMAL HIGH (ref 70–99)

## 2022-02-20 LAB — CBC
HCT: 33.8 % — ABNORMAL LOW (ref 39.0–52.0)
Hemoglobin: 11 g/dL — ABNORMAL LOW (ref 13.0–17.0)
MCH: 28.9 pg (ref 26.0–34.0)
MCHC: 32.5 g/dL (ref 30.0–36.0)
MCV: 88.7 fL (ref 80.0–100.0)
Platelets: 88 10*3/uL — ABNORMAL LOW (ref 150–400)
RBC: 3.81 MIL/uL — ABNORMAL LOW (ref 4.22–5.81)
RDW: 15.8 % — ABNORMAL HIGH (ref 11.5–15.5)
WBC: 6.3 10*3/uL (ref 4.0–10.5)
nRBC: 0 % (ref 0.0–0.2)

## 2022-02-20 MED ORDER — AMIODARONE HCL 200 MG PO TABS: 200.0000 mg | ORAL_TABLET | Freq: Every day | ORAL | Status: DC

## 2022-02-20 MED ORDER — FUROSEMIDE 10 MG/ML IJ SOLN
40.0000 mg | Freq: Once | INTRAMUSCULAR | Status: AC
  Administered 2022-02-20: 40 mg via INTRAVENOUS
  Filled 2022-02-20: qty 4

## 2022-02-20 MED ORDER — SALINE SPRAY 0.65 % NA SOLN
1.0000 | Freq: Two times a day (BID) | NASAL | Status: DC
  Administered 2022-02-20 – 2022-02-23 (×7): 1 via NASAL
  Filled 2022-02-20: qty 44

## 2022-02-20 MED ORDER — DABIGATRAN ETEXILATE MESYLATE 150 MG PO CAPS
150.0000 mg | ORAL_CAPSULE | Freq: Two times a day (BID) | ORAL | Status: DC
  Administered 2022-02-20 – 2022-02-24 (×9): 150 mg via ORAL
  Filled 2022-02-20 (×11): qty 1

## 2022-02-20 NOTE — Progress Notes (Addendum)
The patient has been seen in conjunction with Fabian Sharp, PA-C. All aspects of care have been considered and discussed. The patient has been personally interviewed, examined, and all clinical data has been reviewed.  Agree with plan for IV diuretic (furosemide 40 mg) today. Despite elevated neck veins and rales, he feels stronger today and not quite as short of breath. We will check a basic metabolic panel in a.m.  Progress Note  Patient Name: Antonio Clark Date of Encounter: 02/20/2022  [facility] Cardiologist: Candee Furbish, MD   Subjective   Sitting up in bed, looks much better today. Complains of nose bleed overnight.   He does report an episode of PND last night but did not report this.  Inpatient Medications    Scheduled Meds:  amiodarone  400 mg Oral BID   apixaban  2.5 mg Oral BID   aspirin EC  81 mg Oral Daily   atorvastatin  80 mg Oral Daily   dapagliflozin propanediol  10 mg Oral Daily   diclofenac Sodium  4 g Topical QID   finasteride  5 mg Oral Daily   insulin aspart  0-5 Units Subcutaneous QHS   insulin aspart  0-9 Units Subcutaneous TID WC   linagliptin  5 mg Oral Daily   loratadine  10 mg Oral Daily   metoprolol succinate  50 mg Oral Daily   sodium chloride flush  3 mL Intravenous Q12H   sodium chloride flush  3 mL Intravenous Q12H   Continuous Infusions:  sodium chloride     PRN Meds: sodium chloride, acetaminophen, fluticasone, LORazepam, nitroGLYCERIN, ondansetron (ZOFRAN) IV, phenol, sodium chloride flush, traZODone   Vital Signs    Vitals:   02/19/22 1445 02/19/22 2036 02/20/22 0456 02/20/22 0747  BP: (!) 104/57 108/79 (!) 141/74 133/75  Pulse: 65 66 66 64  Resp:    18  Temp: (!) 97 F (36.1 C) 98.1 F (36.7 C) 97.7 F (36.5 C) 97.6 F (36.4 C)  TempSrc: Oral Oral Oral   SpO2: 96% 97% 98% 96%  Weight:   80.4 kg   Height:        Intake/Output Summary (Last 24 hours) at 02/20/2022 0814 Last data filed at 02/20/2022 0747 Gross per  24 hour  Intake --  Output 750 ml  Net -750 ml      02/20/2022    4:56 AM 02/19/2022    5:30 AM 02/18/2022    4:05 AM  Last 3 Weights  Weight (lbs) 177 lb 4.8 oz 190 lb 4.1 oz 177 lb 9.6 oz  Weight (kg) 80.423 kg 86.3 kg 80.559 kg      Telemetry    Sinus rhythm in the 60s - Personally Reviewed  ECG    No new tracings - Personally Reviewed  Physical Exam   GEN: No acute distress.   Neck: + JVD Cardiac: RRR, no murmurs, rubs, or gallops.  Respiratory: crackles in bases, respirations unlabored GI: Soft, nontender, non-distended  MS: minimal edema; No deformity. Neuro:  Nonfocal  Psych: Normal affect   Labs    High Sensitivity Troponin:   Recent Labs  Lab 02/08/22 1617 02/08/22 1844  TROPONINIHS 34* 35*     Chemistry Recent Labs  Lab 02/17/22 0608 02/18/22 0354 02/19/22 0204 02/20/22 0524  NA 135 136 137 139  K 3.9 3.9 3.6 4.0  CL 104 108 110 111  CO2 18* 20* 21* 20*  GLUCOSE 128* 139* 136* 112*  BUN 70* 61* 50* 36*  CREATININE 2.59* 2.26* 1.97* 1.53*  CALCIUM 8.0* 8.0* 8.1* 8.3*  ALBUMIN 2.9*  --   --   --   GFRNONAA 24* 28* 33* 45*  ANIONGAP '13 8 6 8    '$ Lipids No results for input(s): "CHOL", "TRIG", "HDL", "LABVLDL", "LDLCALC", "CHOLHDL" in the last 168 hours.  Hematology Recent Labs  Lab 02/18/22 0354 02/19/22 0204 02/20/22 0524  WBC 6.4 5.7 6.3  RBC 3.84* 3.61* 3.81*  HGB 11.2* 10.5* 11.0*  HCT 34.1* 31.9* 33.8*  MCV 88.8 88.4 88.7  MCH 29.2 29.1 28.9  MCHC 32.8 32.9 32.5  RDW 15.8* 15.7* 15.8*  PLT 84* 85* 88*   Thyroid No results for input(s): "TSH", "FREET4" in the last 168 hours.  BNPNo results for input(s): "BNP", "PROBNP" in the last 168 hours.  DDimer No results for input(s): "DDIMER" in the last 168 hours.   Radiology    No results found.  Cardiac Studies   Heart cath 02/13/22: Intravascular ultrasound demonstrates moderate eccentric calcific left main stenosis with a minimal lumen area of approximately 12 mm.  PCI is  deferred.   IVUS findings: There is concentric LAD calcification with no stenosis.  There is mild nonobstructive plaquing in the distal and midportion of the left main.  There is moderate eccentric calcified stenosis at the ostium of the left main with a minimal lumen area of 12 mm.   Recommendations:  Discontinue clopidogrel Start amiodarone to control heart rate and facilitate cardioversion in this patient with severe LV dysfunction and atrial fibrillation Consider TEE cardioversion Monday if patient remains in atrial fibrillation Resume heparin in 6 hours       TEE-DCCV 02/17/22:  1. Left ventricular ejection fraction, by estimation, is <20%. The left  ventricle has severely decreased function. The left ventricle demonstrates  global hypokinesis.   2. Right ventricular systolic function is moderately reduced. The right  ventricular size is normal. There is normal pulmonary artery systolic  pressure. The estimated right ventricular systolic pressure is 88.4 mmHg.   3. Left atrial size was severely dilated. No left atrial/left atrial  appendage thrombus was detected.   4. Right atrial size was severely dilated.   5. The mitral valve is normal in structure. Mild to moderate mitral valve  regurgitation. No evidence of mitral stenosis.   6. Tricuspid valve regurgitation is severe.   7. The aortic valve is tricuspid. There is mild calcification of the  aortic valve. Aortic valve regurgitation is mild. No aortic stenosis is  present.   8. There is Moderate (Grade III) plaque.   9. The inferior vena cava is normal in size with greater than 50%  respiratory variability, suggesting right atrial pressure of 3 mmHg.  10. Agitated saline contrast bubble study was negative, with no evidence  of any interatrial shunt.   Patient Profile     86 y.o. male with persistent A-fib status post ablation in 04/2020 with recurrent A-fib status post DCCV in 05/2020 on Pradaxa, DM2, HTN, HLD, strong  family history of CAD, melanoma, prostate cancer, and sleep apnea on CPAP who was admitted to [facility] on 02/08/2022 with  acute HFrEF (EF less than 20% by TEE 02/17/2022) and A-fib with RVR.  He underwent R/LHC on the afternoon of 02/11/2022 which showed severe ostial left main stenosis but IVUS showed MLA >12 millimeters square.  Subsequently underwent electrical cardioversion to sinus bradycardia on IV amiodarone which was converted to oral therapy.  Assessment & Plan    Cardiomyopathy - mixed etiology  Acute on chronic systolic heart failure AKI - improving Previously LVEF normal in 2021, now < 20% on TEE GDMT: toprol 50 mg daily, farxiga 10 mg daily Therapy has been limited by BP and renal function, although AKI is improving, sCr now 1.53, baseline 1.0 He is overall net positive, but continues to diurese with farxiga - volume up on exam - will give 40 mg IV lasix OTO today - follow BMP - will need to continue titrating therapy OP - consider low dose entresto this weekend if he doesn't discharge to CIR today   Atrial fibrillation with RVR Chronic anticoagulation TEE-DCCV 02/17/22 Home pradaxa was briefly swtiched to eliquis given creatinine that peaked greater than 2. Creatinine 1.53 today, will transition back to home pradaxa (was getting this free) Amiodarone was transitioned to PO dosing - 400 mg BID x 7 days, transition to 200 mg daily on 02/25/22.   CAD Hyperlipidemia with LDL goal < 70 Left main disease, no intervention as this was not obstructive Transitioned lipitor to 80 mg Continue ASA, no plavix given OAC   Nosebleeds Will order saline nose spray BID   Disposition Is being evaluated for CIR - will await their recommendations.  Would recommend BMP in 3 days if discharged to CIR.   For questions or updates, please contact [place] Please consult [email or web address] for contact info under        Signed, Ledora Bottcher, PA  02/20/2022, 8:14 AM

## 2022-02-20 NOTE — Progress Notes (Signed)
Mobility Specialist Progress Note    02/20/22 1120  Mobility  Activity Ambulated with assistance in hallway  Level of Assistance Minimal assist, patient does 75% or more  Assistive Device Front wheel walker  Distance Ambulated (ft) 340 ft  Activity Response Tolerated well  $Mobility charge 1 Mobility   Pre-Mobility: 67 HR Post-Mobility: 64 HR  Pt received in bed and agreeable. No complaints on walk. Returned to chair with call bell in reach.    Hildred Alamin Mobility Specialist

## 2022-02-20 NOTE — Progress Notes (Signed)
Inpatient Rehab Admissions Coordinator:  Began insurance authorization. Will continue to follow.   Eduardo Honor Graves Madden, MS, CCC-SLP Admissions Coordinator [phone]  

## 2022-02-20 NOTE — Progress Notes (Signed)
Progress Note  Patient Name: Antonio Clark Date of Encounter: 02/21/2022  [facility] Cardiologist: Candee Furbish, MD   Subjective   Breathing improving. Was able to ambulate the hallway yesterday. Continues to feel weak.  HR 60s BP stable 110-130s Cr stable 1.53>1.52 Net negative 1330 Wt 177>176lbs  Inpatient Medications    Scheduled Meds:  [START ON 02/25/2022] amiodarone  200 mg Oral Daily   amiodarone  400 mg Oral BID   aspirin EC  81 mg Oral Daily   atorvastatin  80 mg Oral Daily   dabigatran  150 mg Oral Q12H   dapagliflozin propanediol  10 mg Oral Daily   diclofenac Sodium  4 g Topical QID   finasteride  5 mg Oral Daily   insulin aspart  0-5 Units Subcutaneous QHS   insulin aspart  0-9 Units Subcutaneous TID WC   linagliptin  5 mg Oral Daily   loratadine  10 mg Oral Daily   metoprolol succinate  50 mg Oral Daily   sodium chloride  1 spray Each Nare BID   sodium chloride flush  3 mL Intravenous Q12H   sodium chloride flush  3 mL Intravenous Q12H   Continuous Infusions:  sodium chloride     PRN Meds: sodium chloride, acetaminophen, fluticasone, LORazepam, nitroGLYCERIN, ondansetron (ZOFRAN) IV, phenol, sodium chloride flush, traZODone   Vital Signs    Vitals:   02/20/22 1836 02/20/22 2053 02/21/22 0500 02/21/22 0700  BP: 123/69 114/68  138/68  Pulse: 60   67  Resp: '18 17  17  '$ Temp: 98.3 F (36.8 C) 97.7 F (36.5 C)  98 F (36.7 C)  TempSrc: Oral Oral  Oral  SpO2:      Weight:   80 kg   Height:        Intake/Output Summary (Last 24 hours) at 02/21/2022 0815 Last data filed at 02/20/2022 1832 Gross per 24 hour  Intake 720 ml  Output 1750 ml  Net -1030 ml      02/21/2022    5:00 AM 02/20/2022    4:56 AM 02/19/2022    5:30 AM  Last 3 Weights  Weight (lbs) 176 lb 5.9 oz 177 lb 4.8 oz 190 lb 4.1 oz  Weight (kg) 80 kg 80.423 kg 86.3 kg      Telemetry    NSR with occasional PVCs - Personally Reviewed  ECG    NSR, first degree AVB -  Personally Reviewed  Physical Exam   GEN: Sitting in bed, comfortable Neck: JVD to mid-neck Cardiac: RRR, no murmurs, rubs, or gallops.  Respiratory: Crackles at left lung base GI: Soft, nontender, non-distended  MS: No edema; No deformity. Neuro:  Nonfocal  Psych: Normal affect   Labs    High Sensitivity Troponin:   Recent Labs  Lab 02/08/22 1617 02/08/22 1844  TROPONINIHS 34* 35*     Chemistry Recent Labs  Lab 02/17/22 0608 02/18/22 0354 02/19/22 0204 02/20/22 0524 02/21/22 0241  NA 135   < > 137 139 138  K 3.9   < > 3.6 4.0 3.9  CL 104   < > 110 111 109  CO2 18*   < > 21* 20* 20*  GLUCOSE 128*   < > 136* 112* 107*  BUN 70*   < > 50* 36* 34*  CREATININE 2.59*   < > 1.97* 1.53* 1.52*  CALCIUM 8.0*   < > 8.1* 8.3* 8.3*  ALBUMIN 2.9*  --   --   --   --  GFRNONAA 24*   < > 33* 45* 45*  ANIONGAP 13   < > '6 8 9   '$ < > = values in this interval not displayed.    Lipids No results for input(s): "CHOL", "TRIG", "HDL", "LABVLDL", "LDLCALC", "CHOLHDL" in the last 168 hours.  Hematology Recent Labs  Lab 02/19/22 0204 02/20/22 0524 02/21/22 0241  WBC 5.7 6.3 6.5  RBC 3.61* 3.81* 3.85*  HGB 10.5* 11.0* 11.2*  HCT 31.9* 33.8* 34.0*  MCV 88.4 88.7 88.3  MCH 29.1 28.9 29.1  MCHC 32.9 32.5 32.9  RDW 15.7* 15.8* 15.6*  PLT 85* 88* 89*   Thyroid No results for input(s): "TSH", "FREET4" in the last 168 hours.  BNPNo results for input(s): "BNP", "PROBNP" in the last 168 hours.  DDimer No results for input(s): "DDIMER" in the last 168 hours.   Radiology    No results found.  Cardiac Studies   Heart cath 02/13/22: Intravascular ultrasound demonstrates moderate eccentric calcific left main stenosis with a minimal lumen area of approximately 12 mm.  PCI is deferred.   IVUS findings: There is concentric LAD calcification with no stenosis.  There is mild nonobstructive plaquing in the distal and midportion of the left main.  There is moderate eccentric calcified  stenosis at the ostium of the left main with a minimal lumen area of 12 mm.   Recommendations:  Discontinue clopidogrel Start amiodarone to control heart rate and facilitate cardioversion in this patient with severe LV dysfunction and atrial fibrillation Consider TEE cardioversion Monday if patient remains in atrial fibrillation Resume heparin in 6 hours       TEE-DCCV 02/17/22:  1. Left ventricular ejection fraction, by estimation, is <20%. The left  ventricle has severely decreased function. The left ventricle demonstrates  global hypokinesis.   2. Right ventricular systolic function is moderately reduced. The right  ventricular size is normal. There is normal pulmonary artery systolic  pressure. The estimated right ventricular systolic pressure is 71.2 mmHg.   3. Left atrial size was severely dilated. No left atrial/left atrial  appendage thrombus was detected.   4. Right atrial size was severely dilated.   5. The mitral valve is normal in structure. Mild to moderate mitral valve  regurgitation. No evidence of mitral stenosis.   6. Tricuspid valve regurgitation is severe.   7. The aortic valve is tricuspid. There is mild calcification of the  aortic valve. Aortic valve regurgitation is mild. No aortic stenosis is  present.   8. There is Moderate (Grade III) plaque.   9. The inferior vena cava is normal in size with greater than 50%  respiratory variability, suggesting right atrial pressure of 3 mmHg.  10. Agitated saline contrast bubble study was negative, with no evidence  of any interatrial shunt.   Patient Profile     86 y.o. male with persistent A-fib status post ablation in 04/2020 with recurrent A-fib status post DCCV in 05/2020 on Pradaxa, DM2, HTN, HLD, strong family history of CAD, melanoma, prostate cancer, and sleep apnea on CPAP who was admitted to [facility] on 02/08/2022 with  acute HFrEF (EF less than 20% by TEE 02/17/2022) and A-fib with RVR.  He underwent R/LHC on the  afternoon of 02/11/2022 which showed severe ostial left main stenosis but IVUS showed MLA >12 millimeters square.  Subsequently underwent electrical cardioversion to sinus bradycardia on IV amiodarone which was converted to oral therapy.  Assessment & Plan    #Acute on Chronic Systolic Heart Failure: LVEF <20%, moderately  reduced RV, mild-to-moderate MR, severe TR. Cath without obstructive disease and likely drop in EF related to Afib with RVR. Responded well to IV diuresis which was held due to rising Cr. Continues to appear volume up on exam today. Will start PO lasix and spironolactone and monitor response. May need additional IV dosing pending UOP.  -Start lasix '20mg'$  PO daily -Start spironolactone 12.'5mg'$  daily -Monitor UOP and re-dose IV lasix as needed -Continue metop '50mg'$  XL daily -Continue farxiga '10mg'$  daily -Monitor I/Os and daily weights -Low Na diet  #Atrial Fibrillation with RVR: #Chronic AC: S/p TEE DCCV 02/17/22. On amiodarone PO daily. -Continue amiodarone '400mg'$  BID with plans to transition to '200mg'$  daily on 02/25/22 -Continue pradaxa '150mg'$  BID  #CAD: Severe ostial/prox LM disease with 70% stenosis. 20% prox-mid LAD. Mild RCA disease. IVUS without significant LM narrowing. Recommended for continued medical therapy  -Continue ASA '81mg'$  daily -Continue lipitor '80mg'$  daily -Continue metop '50mg'$  XL daily  #HLD: -Continue lipitor '80mg'$  daily   For questions or updates, please contact [place] Please consult [email or web address] for contact info under        Signed, Freada Bergeron, MD  02/21/2022, 8:15 AM

## 2022-02-21 LAB — CBC
HCT: 34 % — ABNORMAL LOW (ref 39.0–52.0)
Hemoglobin: 11.2 g/dL — ABNORMAL LOW (ref 13.0–17.0)
MCH: 29.1 pg (ref 26.0–34.0)
MCHC: 32.9 g/dL (ref 30.0–36.0)
MCV: 88.3 fL (ref 80.0–100.0)
Platelets: 89 10*3/uL — ABNORMAL LOW (ref 150–400)
RBC: 3.85 MIL/uL — ABNORMAL LOW (ref 4.22–5.81)
RDW: 15.6 % — ABNORMAL HIGH (ref 11.5–15.5)
WBC: 6.5 10*3/uL (ref 4.0–10.5)
nRBC: 0 % (ref 0.0–0.2)

## 2022-02-21 LAB — BASIC METABOLIC PANEL
Anion gap: 9 (ref 5–15)
BUN: 34 mg/dL — ABNORMAL HIGH (ref 8–23)
CO2: 20 mmol/L — ABNORMAL LOW (ref 22–32)
Calcium: 8.3 mg/dL — ABNORMAL LOW (ref 8.9–10.3)
Chloride: 109 mmol/L (ref 98–111)
Creatinine, Ser: 1.52 mg/dL — ABNORMAL HIGH (ref 0.61–1.24)
GFR, Estimated: 45 mL/min — ABNORMAL LOW (ref >60)
Glucose, Bld: 107 mg/dL — ABNORMAL HIGH (ref 70–99)
Potassium: 3.9 mmol/L (ref 3.5–5.1)
Sodium: 138 mmol/L (ref 135–145)

## 2022-02-21 LAB — GLUCOSE, CAPILLARY
Glucose-Capillary: 113 mg/dL — ABNORMAL HIGH (ref 70–99)
Glucose-Capillary: 153 mg/dL — ABNORMAL HIGH (ref 70–99)
Glucose-Capillary: 95 mg/dL (ref 70–99)
Glucose-Capillary: 151 mg/dL — ABNORMAL HIGH (ref 70–99)

## 2022-02-21 MED ORDER — FUROSEMIDE 20 MG PO TABS
20.0000 mg | ORAL_TABLET | Freq: Every day | ORAL | Status: DC
  Administered 2022-02-21: 20 mg via ORAL
  Filled 2022-02-21: qty 1

## 2022-02-21 MED ORDER — SPIRONOLACTONE 12.5 MG HALF TABLET
12.5000 mg | ORAL_TABLET | Freq: Every day | ORAL | Status: DC
  Administered 2022-02-21 – 2022-02-24 (×4): 12.5 mg via ORAL
  Filled 2022-02-21 (×4): qty 1

## 2022-02-21 NOTE — Plan of Care (Signed)
°  Problem: Clinical Measurements: °Goal: Ability to maintain clinical measurements within normal limits will improve °Outcome: Progressing °  °Problem: Activity: °Goal: Risk for activity intolerance will decrease °Outcome: Progressing °  °Problem: Nutrition: °Goal: Adequate nutrition will be maintained °Outcome: Progressing °  °

## 2022-02-22 LAB — BASIC METABOLIC PANEL
Anion gap: 12 (ref 5–15)
BUN: 30 mg/dL — ABNORMAL HIGH (ref 8–23)
CO2: 20 mmol/L — ABNORMAL LOW (ref 22–32)
Calcium: 8.2 mg/dL — ABNORMAL LOW (ref 8.9–10.3)
Chloride: 106 mmol/L (ref 98–111)
Creatinine, Ser: 1.58 mg/dL — ABNORMAL HIGH (ref 0.61–1.24)
GFR, Estimated: 43 mL/min — ABNORMAL LOW (ref >60)
Glucose, Bld: 150 mg/dL — ABNORMAL HIGH (ref 70–99)
Potassium: 3.9 mmol/L (ref 3.5–5.1)
Sodium: 138 mmol/L (ref 135–145)

## 2022-02-22 LAB — CBC
HCT: 35.7 % — ABNORMAL LOW (ref 39.0–52.0)
Hemoglobin: 11.8 g/dL — ABNORMAL LOW (ref 13.0–17.0)
MCH: 28.9 pg (ref 26.0–34.0)
MCHC: 33.1 g/dL (ref 30.0–36.0)
MCV: 87.5 fL (ref 80.0–100.0)
Platelets: 118 10*3/uL — ABNORMAL LOW (ref 150–400)
RBC: 4.08 MIL/uL — ABNORMAL LOW (ref 4.22–5.81)
RDW: 15.3 % (ref 11.5–15.5)
WBC: 7.5 10*3/uL (ref 4.0–10.5)
nRBC: 0 % (ref 0.0–0.2)

## 2022-02-22 LAB — GLUCOSE, CAPILLARY
Glucose-Capillary: 102 mg/dL — ABNORMAL HIGH (ref 70–99)
Glucose-Capillary: 124 mg/dL — ABNORMAL HIGH (ref 70–99)
Glucose-Capillary: 123 mg/dL — ABNORMAL HIGH (ref 70–99)
Glucose-Capillary: 147 mg/dL — ABNORMAL HIGH (ref 70–99)
Glucose-Capillary: 140 mg/dL — ABNORMAL HIGH (ref 70–99)

## 2022-02-22 MED ORDER — FUROSEMIDE 10 MG/ML IJ SOLN
40.0000 mg | Freq: Once | INTRAMUSCULAR | Status: AC
  Administered 2022-02-22: 40 mg via INTRAVENOUS
  Filled 2022-02-22: qty 4

## 2022-02-22 NOTE — Progress Notes (Signed)
Progress Note  Patient Name: Antonio Clark Date of Encounter: 02/22/2022  [facility] Cardiologist: Candee Furbish, MD   Subjective   States breathing is slightly worse today. Feels better with exertion. No chest pain  Cr 1.52>1.58 Wt 176>170lbs  Inpatient Medications    Scheduled Meds:  [START ON 02/25/2022] amiodarone  200 mg Oral Daily   amiodarone  400 mg Oral BID   aspirin EC  81 mg Oral Daily   atorvastatin  80 mg Oral Daily   dabigatran  150 mg Oral Q12H   dapagliflozin propanediol  10 mg Oral Daily   diclofenac Sodium  4 g Topical QID   finasteride  5 mg Oral Daily   furosemide  20 mg Oral Daily   insulin aspart  0-5 Units Subcutaneous QHS   insulin aspart  0-9 Units Subcutaneous TID WC   linagliptin  5 mg Oral Daily   loratadine  10 mg Oral Daily   metoprolol succinate  50 mg Oral Daily   sodium chloride  1 spray Each Nare BID   sodium chloride flush  3 mL Intravenous Q12H   spironolactone  12.5 mg Oral Daily   Continuous Infusions:  sodium chloride     PRN Meds: sodium chloride, acetaminophen, fluticasone, LORazepam, nitroGLYCERIN, ondansetron (ZOFRAN) IV, phenol, traZODone   Vital Signs    Vitals:   02/21/22 0700 02/21/22 1137 02/21/22 2028 02/22/22 0500  BP: 138/68 119/61 131/62 124/64  Pulse: 67 62    Resp: '17 20 18 16  '$ Temp: 98 F (36.7 C) 97.8 F (36.6 C) 98 F (36.7 C) 98.1 F (36.7 C)  TempSrc: Oral Oral Oral Oral  SpO2:      Weight:    77.3 kg  Height:        Intake/Output Summary (Last 24 hours) at 02/22/2022 0824 Last data filed at 02/21/2022 1700 Gross per 24 hour  Intake --  Output 1750 ml  Net -1750 ml       02/22/2022    5:00 AM 02/21/2022    5:00 AM 02/20/2022    4:56 AM  Last 3 Weights  Weight (lbs) 170 lb 6.4 oz 176 lb 5.9 oz 177 lb 4.8 oz  Weight (kg) 77.293 kg 80 kg 80.423 kg      Telemetry    NSR, rare PVCs - Personally Reviewed  ECG    No new tracing - Personally Reviewed  Physical Exam   GEN: Sitting  in bed, comfortable Neck: JVD to mid-neck Cardiac: RRR, no murmurs, rubs, or gallops.  Respiratory: Crackles at left lung base GI: Soft, nontender, non-distended  MS: No edema; No deformity. Neuro:  Nonfocal  Psych: Normal affect   Labs    High Sensitivity Troponin:   Recent Labs  Lab 02/08/22 1617 02/08/22 1844  TROPONINIHS 34* 35*      Chemistry Recent Labs  Lab 02/17/22 0608 02/18/22 0354 02/20/22 0524 02/21/22 0241 02/22/22 0332  NA 135   < > 139 138 138  K 3.9   < > 4.0 3.9 3.9  CL 104   < > 111 109 106  CO2 18*   < > 20* 20* 20*  GLUCOSE 128*   < > 112* 107* 150*  BUN 70*   < > 36* 34* 30*  CREATININE 2.59*   < > 1.53* 1.52* 1.58*  CALCIUM 8.0*   < > 8.3* 8.3* 8.2*  ALBUMIN 2.9*  --   --   --   --   GFRNONAA 24*   < >  45* 45* 43*  ANIONGAP 13   < > '8 9 12   '$ < > = values in this interval not displayed.     Lipids No results for input(s): "CHOL", "TRIG", "HDL", "LABVLDL", "LDLCALC", "CHOLHDL" in the last 168 hours.  Hematology Recent Labs  Lab 02/20/22 0524 02/21/22 0241 02/22/22 0332  WBC 6.3 6.5 7.5  RBC 3.81* 3.85* 4.08*  HGB 11.0* 11.2* 11.8*  HCT 33.8* 34.0* 35.7*  MCV 88.7 88.3 87.5  MCH 28.9 29.1 28.9  MCHC 32.5 32.9 33.1  RDW 15.8* 15.6* 15.3  PLT 88* 89* 118*    Thyroid No results for input(s): "TSH", "FREET4" in the last 168 hours.  BNPNo results for input(s): "BNP", "PROBNP" in the last 168 hours.  DDimer No results for input(s): "DDIMER" in the last 168 hours.   Radiology    No results found.  Cardiac Studies   Heart cath 02/13/22: Intravascular ultrasound demonstrates moderate eccentric calcific left main stenosis with a minimal lumen area of approximately 12 mm.  PCI is deferred.   IVUS findings: There is concentric LAD calcification with no stenosis.  There is mild nonobstructive plaquing in the distal and midportion of the left main.  There is moderate eccentric calcified stenosis at the ostium of the left main with a minimal  lumen area of 12 mm.   Recommendations:  Discontinue clopidogrel Start amiodarone to control heart rate and facilitate cardioversion in this patient with severe LV dysfunction and atrial fibrillation Consider TEE cardioversion Monday if patient remains in atrial fibrillation Resume heparin in 6 hours       TEE-DCCV 02/17/22:  1. Left ventricular ejection fraction, by estimation, is <20%. The left  ventricle has severely decreased function. The left ventricle demonstrates  global hypokinesis.   2. Right ventricular systolic function is moderately reduced. The right  ventricular size is normal. There is normal pulmonary artery systolic  pressure. The estimated right ventricular systolic pressure is 44.0 mmHg.   3. Left atrial size was severely dilated. No left atrial/left atrial  appendage thrombus was detected.   4. Right atrial size was severely dilated.   5. The mitral valve is normal in structure. Mild to moderate mitral valve  regurgitation. No evidence of mitral stenosis.   6. Tricuspid valve regurgitation is severe.   7. The aortic valve is tricuspid. There is mild calcification of the  aortic valve. Aortic valve regurgitation is mild. No aortic stenosis is  present.   8. There is Moderate (Grade III) plaque.   9. The inferior vena cava is normal in size with greater than 50%  respiratory variability, suggesting right atrial pressure of 3 mmHg.  10. Agitated saline contrast bubble study was negative, with no evidence  of any interatrial shunt.   Patient Profile     86 y.o. male with persistent A-fib status post ablation in 04/2020 with recurrent A-fib status post DCCV in 05/2020 on Pradaxa, DM2, HTN, HLD, strong family history of CAD, melanoma, prostate cancer, and sleep apnea on CPAP who was admitted to [facility] on 02/08/2022 with  acute HFrEF (EF less than 20% by TEE 02/17/2022) and A-fib with RVR.  He underwent R/LHC on the afternoon of 02/11/2022 which showed severe ostial left main  stenosis but IVUS showed MLA >12 millimeters square.  Subsequently underwent electrical cardioversion to sinus bradycardia on IV amiodarone which was converted to oral therapy.  Assessment & Plan    #Acute on Chronic Systolic Heart Failure: LVEF <20%, moderately reduced RV, mild-to-moderate MR,  severe TR. Cath without obstructive disease and likely drop in EF related to Afib with RVR. Responded well to IV diuresis which was held due to rising Cr. Started on PO lasix and spironolactone on 7/15 with stable Cr overnight. Given that he continues to appear volume up on exam, will re-dose lasix IV today and monitor response.  -Re-dose lasix '40mg'$  IV and monitor response -Continue spironolactone 12.'5mg'$  daily -Continue metop '50mg'$  XL daily -Continue farxiga '10mg'$  daily -Monitor I/Os and daily weights -Low Na diet  #Atrial Fibrillation with RVR: #Chronic AC: S/p TEE DCCV 02/17/22. On amiodarone PO daily. -Continue amiodarone '400mg'$  BID with plans to transition to '200mg'$  daily on 02/25/22 -Continue pradaxa '150mg'$  BID  #CAD: Severe ostial/prox LM disease with 70% stenosis. 20% prox-mid LAD. Mild RCA disease. IVUS without significant LM narrowing. Recommended for continued medical therapy  -Continue ASA '81mg'$  daily -Continue lipitor '80mg'$  daily -Continue metop '50mg'$  XL daily  #HLD: -Continue lipitor '80mg'$  daily   For questions or updates, please contact [facility] Please consult [email or web address] for contact info under        Signed, Freada Bergeron, MD  02/22/2022, 8:24 AM

## 2022-02-22 NOTE — Progress Notes (Signed)
C/O Nausea with episode of vomiting x 1 while ambulating. States he had nose bleed that precipitated the nausea. Zofran 4 mg IV given. Pt used saline nose spray and bleed is resolved. No further c/o expressed.

## 2022-02-23 LAB — GLUCOSE, CAPILLARY
Glucose-Capillary: 91 mg/dL (ref 70–99)
Glucose-Capillary: 101 mg/dL — ABNORMAL HIGH (ref 70–99)
Glucose-Capillary: 179 mg/dL — ABNORMAL HIGH (ref 70–99)
Glucose-Capillary: 124 mg/dL — ABNORMAL HIGH (ref 70–99)

## 2022-02-23 LAB — BASIC METABOLIC PANEL
Anion gap: 6 (ref 5–15)
BUN: 23 mg/dL (ref 8–23)
CO2: 25 mmol/L (ref 22–32)
Calcium: 8.3 mg/dL — ABNORMAL LOW (ref 8.9–10.3)
Chloride: 108 mmol/L (ref 98–111)
Creatinine, Ser: 1.43 mg/dL — ABNORMAL HIGH (ref 0.61–1.24)
GFR, Estimated: 48 mL/min — ABNORMAL LOW (ref >60)
Glucose, Bld: 94 mg/dL (ref 70–99)
Potassium: 3.8 mmol/L (ref 3.5–5.1)
Sodium: 139 mmol/L (ref 135–145)

## 2022-02-23 MED ORDER — FUROSEMIDE 40 MG PO TABS
40.0000 mg | ORAL_TABLET | Freq: Every day | ORAL | Status: DC
Start: 2022-02-23 — End: 2022-02-24
  Administered 2022-02-23 – 2022-02-24 (×2): 40 mg via ORAL
  Filled 2022-02-23 (×2): qty 1

## 2022-02-23 NOTE — Progress Notes (Signed)
Occupational Therapy Treatment Patient Details Name: Antonio Clark MRN: [id number] DOB: 1936/07/29 Today's Date: 02/23/2022   History of present illness pt is an 86 y/o male admitted 7/5 from [facility] acute HFrEF/afib with RVR and having underwent R/L HC on 7/5 showing severe ostial L main stenosis, it was recommended to transfer to [facility] for revascularization.  s/p TEE cardioversion 7/11.  PMHx:  Afib, DM2, HTN, prostate CA, ablation, coronary angiography,   OT comments  Pt progressing well towards acute OT goals. Tolerated OOB activity well, walked in the hall, stood to brush teeth. Encouraged incorporating rest breaks between blocks of activity. Updating d/c recommendation to home with [facility] OT.   Recommendations for follow up therapy are one component of a multi-disciplinary discharge planning process, led by the attending physician.  Recommendations may be updated based on patient status, additional functional criteria and insurance authorization.    Follow Up Recommendations  Home health OT    Assistance Recommended at Discharge Intermittent Supervision/Assistance  Patient can return home with the following      Equipment Recommendations  Tub/shower seat    Recommendations for Other Services      Precautions / Restrictions Precautions Precautions: Fall Restrictions Weight Bearing Restrictions: No       Mobility Bed Mobility Overal bed mobility: Needs Assistance Bed Mobility: Supine to Sit     Supine to sit: Supervision          Transfers Overall transfer level: Needs assistance Equipment used: Rolling walker (2 wheels) Transfers: Sit to/from Stand Sit to Stand: Min guard           General transfer comment: min guard for safety     Balance Overall balance assessment: Needs assistance Sitting-balance support: Feet unsupported Sitting balance-Leahy Scale: Good     Standing balance support: Bilateral upper extremity supported, During functional  activity Standing balance-Leahy Scale: Fair Standing balance comment: still needing AD for safety                           ADL either performed or assessed with clinical judgement   ADL Overall ADL's : Needs assistance/impaired     Grooming: Oral care;Standing;Min guard                   Toilet Transfer: Min guard;Ambulation;Comfort height toilet;Rolling walker (2 wheels) Toilet Transfer Details (indicate cue type and reason): simulated         Functional mobility during ADLs: Min guard;Rolling walker (2 wheels) General ADL Comments: Min guard for OOB ADLs. Tolerated session well. Deficits in balance remain but can be addressed in home setting after d/c.    Extremity/Trunk Assessment Upper Extremity Assessment Upper Extremity Assessment: Generalized weakness   Lower Extremity Assessment Lower Extremity Assessment: Defer to PT evaluation        Vision       Perception     Praxis      Cognition Arousal/Alertness: Awake/alert Behavior During Therapy: WFL for tasks assessed/performed Overall Cognitive Status: Within Functional Limits for tasks assessed                                          Exercises      Shoulder Instructions       General Comments VSS on RA    Pertinent Vitals/ Pain       Pain Assessment Pain  Assessment: No/denies pain  Home Living                                          Prior Functioning/Environment              Frequency  Min 2X/week        Progress Toward Goals  OT Goals(current goals can now be found in the care plan section)  Progress towards OT goals: Progressing toward goals  Acute Rehab OT Goals Patient Stated Goal: I want to be independent like I was at home OT Goal Formulation: With patient Time For Goal Achievement: 03/05/22 Potential to Achieve Goals: Good ADL Goals Pt Will Perform Grooming: with modified independence;standing Pt Will Perform Lower  Body Dressing: with modified independence;sit to/from stand Pt Will Transfer to Toilet: with modified independence;ambulating Pt/caregiver will Perform Home Exercise Program: Increased strength;Both right and left upper extremity;With theraband;With written HEP provided  Plan Discharge plan needs to be updated    Co-evaluation                 AM-PAC OT "6 Clicks" Daily Activity     Outcome Measure   Help from another person eating meals?: None Help from another person taking care of personal grooming?: None Help from another person toileting, which includes using toliet, bedpan, or urinal?: A Little Help from another person bathing (including washing, rinsing, drying)?: A Little Help from another person to put on and taking off regular upper body clothing?: None Help from another person to put on and taking off regular lower body clothing?: A Little 6 Click Score: 21    End of Session Equipment Utilized During Treatment: Rolling walker (2 wheels)  OT Visit Diagnosis: Unsteadiness on feet (R26.81);Muscle weakness (generalized) (M62.81)   Activity Tolerance Patient tolerated treatment well   Patient Left in chair;with call bell/phone within reach;with chair alarm set   Nurse Communication          Time: [phone] OT Time Calculation (min): 27 min  Charges: OT General Charges $OT Visit: 1 Visit OT Treatments $Self Care/Home Management : 23-37 mins  Tyrone Schimke, OT Acute Rehabilitation Services Office: [phone]   Hortencia Pilar 02/23/2022, 1:59 PM

## 2022-02-23 NOTE — Progress Notes (Addendum)
Progress Note  Patient Name: Antonio Clark Date of Encounter: 02/23/2022  [facility] Cardiologist: Candee Furbish, MD   Subjective   Patient feels much better today, denies any SOB, orthopnea, chest pain, palpitations. Maintaining sinus rhythm per telemetry   Inpatient Medications    Scheduled Meds:  [START ON 02/25/2022] amiodarone  200 mg Oral Daily   amiodarone  400 mg Oral BID   aspirin EC  81 mg Oral Daily   atorvastatin  80 mg Oral Daily   dabigatran  150 mg Oral Q12H   dapagliflozin propanediol  10 mg Oral Daily   diclofenac Sodium  4 g Topical QID   finasteride  5 mg Oral Daily   insulin aspart  0-5 Units Subcutaneous QHS   insulin aspart  0-9 Units Subcutaneous TID WC   linagliptin  5 mg Oral Daily   loratadine  10 mg Oral Daily   metoprolol succinate  50 mg Oral Daily   sodium chloride  1 spray Each Nare BID   sodium chloride flush  3 mL Intravenous Q12H   spironolactone  12.5 mg Oral Daily   Continuous Infusions:  sodium chloride     PRN Meds: sodium chloride, acetaminophen, fluticasone, LORazepam, nitroGLYCERIN, ondansetron (ZOFRAN) IV, phenol, traZODone   Vital Signs    Vitals:   02/22/22 2032 02/23/22 0000 02/23/22 0423 02/23/22 0500  BP: (!) 121/52  136/66   Pulse: 60  (!) 57   Resp: '15 15 20   '$ Temp: 97.6 F (36.4 C)  97.7 F (36.5 C)   TempSrc: Oral  Axillary   SpO2: 96%  99%   Weight:    77 kg  Height:        Intake/Output Summary (Last 24 hours) at 02/23/2022 0835 Last data filed at 02/23/2022 0435 Gross per 24 hour  Intake 120 ml  Output 2350 ml  Net -2230 ml      02/23/2022    5:00 AM 02/22/2022    5:00 AM 02/21/2022    5:00 AM  Last 3 Weights  Weight (lbs) 169 lb 12.1 oz 170 lb 6.4 oz 176 lb 5.9 oz  Weight (kg) 77 kg 77.293 kg 80 kg      Telemetry    Sinus rhythm, HR in the 60s - Personally Reviewed  ECG    No new tracings since 7/11 - Personally Reviewed  Physical Exam   GEN: No acute distress. Sitting upright in the  bed in no acute distress  Neck: No JVD Cardiac: RRR, faint systolic murmur at sternal boarder  Respiratory: Clear to auscultation bilaterally. GI: Soft, nontender, non-distended  MS: No edema; No deformity. Neuro:  Nonfocal  Psych: Normal affect   Labs    High Sensitivity Troponin:   Recent Labs  Lab 02/08/22 1617 02/08/22 1844  TROPONINIHS 34* 35*     Chemistry Recent Labs  Lab 02/17/22 0608 02/18/22 0354 02/21/22 0241 02/22/22 0332 02/23/22 0354  NA 135   < > 138 138 139  K 3.9   < > 3.9 3.9 3.8  CL 104   < > 109 106 108  CO2 18*   < > 20* 20* 25  GLUCOSE 128*   < > 107* 150* 94  BUN 70*   < > 34* 30* 23  CREATININE 2.59*   < > 1.52* 1.58* 1.43*  CALCIUM 8.0*   < > 8.3* 8.2* 8.3*  ALBUMIN 2.9*  --   --   --   --   GFRNONAA 24*   < >  45* 43* 48*  ANIONGAP 13   < > '9 12 6   '$ < > = values in this interval not displayed.    Lipids No results for input(s): "CHOL", "TRIG", "HDL", "LABVLDL", "LDLCALC", "CHOLHDL" in the last 168 hours.  Hematology Recent Labs  Lab 02/20/22 0524 02/21/22 0241 02/22/22 0332  WBC 6.3 6.5 7.5  RBC 3.81* 3.85* 4.08*  HGB 11.0* 11.2* 11.8*  HCT 33.8* 34.0* 35.7*  MCV 88.7 88.3 87.5  MCH 28.9 29.1 28.9  MCHC 32.5 32.9 33.1  RDW 15.8* 15.6* 15.3  PLT 88* 89* 118*   Thyroid No results for input(s): "TSH", "FREET4" in the last 168 hours.  BNPNo results for input(s): "BNP", "PROBNP" in the last 168 hours.  DDimer No results for input(s): "DDIMER" in the last 168 hours.   Radiology    No results found.  Cardiac Studies   Right/Left Heart Catheterization 02/11/22 Conclusions: Severe ostial/proximal LMCA disease with focal, eccentric 70% stenosis.  There is also 20% proximal/mid LAD stenosis as well as minimal luminal irregularities in the RCA. Mildly elevated left heart filling pressures (PCWP/LVEDP 20 mmHg). Mild pulmonary hypertension (mean PAP 31 mmHg). Mildly reduced Fick cardiac output/index (CO 4.7 L/min, CI 2.4  L/min/m^2). Relatively small left radial artery by ultrasound with significant vasospasm.  Recommend alternative access for PCI.   Recommendations: Images reviewed with Dr. Martinique; we will plan to transfer Mr. Nee to [facility] for revascularization of ostial/proximal LMCA stenosis.  The lesion appears well-suited to stenting; I would favor IVUS-guided PCI over CABG in the setting of low Syntax score, advanced age, and other comorbidities. Continue diuresis and optimization of GDMT for acute HFrEF. Aggressive secondary prevention of coronary artery disease.  Echo TEE and DCCV  02/17/22  1. Left ventricular ejection fraction, by estimation, is <20%. The left  ventricle has severely decreased function. The left ventricle demonstrates  global hypokinesis.   2. Right ventricular systolic function is moderately reduced. The right  ventricular size is normal. There is normal pulmonary artery systolic  pressure. The estimated right ventricular systolic pressure is 53.6 mmHg.   3. Left atrial size was severely dilated. No left atrial/left atrial  appendage thrombus was detected.   4. Right atrial size was severely dilated.   5. The mitral valve is normal in structure. Mild to moderate mitral valve  regurgitation. No evidence of mitral stenosis.   6. Tricuspid valve regurgitation is severe.   7. The aortic valve is tricuspid. There is mild calcification of the  aortic valve. Aortic valve regurgitation is mild. No aortic stenosis is  present.   8. There is Moderate (Grade III) plaque.   9. The inferior vena cava is normal in size with greater than 50%  respiratory variability, suggesting right atrial pressure of 3 mmHg.  10. Agitated saline contrast bubble study was negative, with no evidence  of any interatrial shunt.   Conclusion(s)/Recommendation(s): No LA/LAA thrombus identified. Successful  cardioversion performed with restoration of normal sinus rhythm.   Patient Profile     86 y.o.  male with persistent A-fib status post ablation in 04/2020 with recurrent A-fib status post DCCV in 05/2020 on Pradaxa, DM2, HTN, HLD, strong family history of CAD, melanoma, prostate cancer, and sleep apnea on CPAP who was admitted to [facility] on 02/08/2022 with  acute HFrEF (EF less than 20% by TEE 02/17/2022) and A-fib with RVR.  He underwent R/LHC on the afternoon of 02/11/2022 which showed severe ostial left main stenosis but IVUS showed MLA >  12 millimeters square.  Subsequently underwent electrical cardioversion to sinus bradycardia on IV amiodarone which was converted to oral therapy.  Assessment & Plan    Acute on Chronic Systolic Heart Failure - Echocardiogram from 7/3 showed EF 25-30%, normal RV systolic function  - TEE this admission showed EF <20%, moderately reduced RV systolic function, mild-moderate MR, severe TR  - Cath this admission without obstructive CAD-- suspect drop in EF is likely related to Afib with RVR (now S/P TEE.DCCV, maintaining sinus rhythm)  - Patient responded well to IV diuretics-- was transitioned to oral lasix on 7/15. However, was given additional IV lasix on 7/16  - Output 2.35 L urine on 7/16. Currently net -4.8 L since admission. Creatinine improved from 1.58>>1.43 - Start PO lasix 40 mg daily  - Continue farxiga 10 mg daily  - Continue metoprolol succinate 50 mg daily  - Continue spironolactone 12.5 mg daily  - Was on losartan 12.5 mg daily prior to admission- holding for now due to labile renal function this admission. BP stable. Continue to hold for now, can add back on at outpatient follow up  - Plan if for CIR-- would recommend BMP every other day   Atrial Fibrillation with RVR  On chronic AC  - Underwent TEE guided DCCV on 02/17/22- now on amiodarone PO  - Continue amiodarone 400 mg BID -- plan transition to 200 mg daily on 7/19  - On pradaxa 150 mg BID   CAD  - Cath this admission with eccentric 70% stenosis in the ostial LMCA, 20% stenosis of the  proximal/mid LAD  - Continue aspirin  - Continue statin  - Continue metoprolol    For questions or updates, please contact [facility] Please consult [email or web address] for contact info under        Signed, Margie Billet, PA-C  02/23/2022, 8:35 AM    Personally seen and examined. Agree with above.  86 year old with atrial fibrillation status post cardioversion with acute on chronic systolic heart failure with a EF less than 20%, coronary artery disease with marked debility  Awaiting CIR. Continuing on p.o. Lasix 40 mg a day.  No significant pretibial edema.  Lungs clear. On discharge to rehab, would transition to 200 mg a day of amiodarone to avoid confusion.  Continue with Pradaxa  Candee Furbish, MD

## 2022-02-23 NOTE — Progress Notes (Signed)
Inpatient Rehab Admissions Coordinator:  Note therapy has updated recommendations to [facility]. Informed pt and Antonio Clark (caregiver). Both are in agreement. TOC made aware. AC will sign off.   Gayland Curry, [place], [place] Admissions Coordinator [phone]

## 2022-02-23 NOTE — Progress Notes (Signed)
Physical Therapy Treatment Patient Details Name: Antonio Clark MRN: [id number] DOB: 09-Nov-1935 Today's Date: 02/23/2022   History of Present Illness pt is an 86 y/o male admitted 7/5 from [facility] acute HFrEF/afib with RVR and having underwent R/L HC on 7/5 showing severe ostial L main stenosis, it was recommended to transfer to [facility] for revascularization.  s/p TEE cardioversion 7/11.  PMHx:  Afib, DM2, HTN, prostate CA, ablation, coronary angiography,    PT Comments    Pt making excellent progress with mobility. Believe he can return home with some intermittent support from PT standpoint. Will need to use his walker and recommend HHPT.    Recommendations for follow up therapy are one component of a multi-disciplinary discharge planning process, led by the attending physician.  Recommendations may be updated based on patient status, additional functional criteria and insurance authorization.  Follow Up Recommendations  Home health PT     Assistance Recommended at Discharge Intermittent Supervision/Assistance  Patient can return home with the following Assist for transportation;Help with stairs or ramp for entrance   Equipment Recommendations  None recommended by PT    Recommendations for Other Services       Precautions / Restrictions Precautions Precautions: Fall Restrictions Weight Bearing Restrictions: No     Mobility  Bed Mobility               General bed mobility comments: Pt up in chair    Transfers Overall transfer level: Needs assistance Equipment used: Rolling walker (2 wheels), None Transfers: Sit to/from Stand Sit to Stand: Supervision           General transfer comment: Supervision for safety    Ambulation/Gait Ambulation/Gait assistance: Supervision, Min guard Gait Distance (Feet): 300 Feet (300' x 1, 125' x 2) Assistive device: Rolling walker (2 wheels), None Gait Pattern/deviations: Step-through pattern Gait velocity: decr Gait velocity  interpretation: 1.31 - 2.62 ft/sec, indicative of limited community ambulator   General Gait Details: Supervision when amb with walker with pt occasionally having to self correct an anterior lean. Without walker pt required min guard.   Stairs             Wheelchair Mobility    Modified Rankin (Stroke Patients Only)       Balance Overall balance assessment: Needs assistance Sitting-balance support: Feet unsupported Sitting balance-Leahy Scale: Good     Standing balance support: No upper extremity supported Standing balance-Leahy Scale: Fair                              Cognition Arousal/Alertness: Awake/alert Behavior During Therapy: WFL for tasks assessed/performed Overall Cognitive Status: Within Functional Limits for tasks assessed                                          Exercises      General Comments General comments (skin integrity, edema, etc.): VSS on RA      Pertinent Vitals/Pain      Home Living                          Prior Function            PT Goals (current goals can now be found in the care plan section) Acute Rehab PT Goals Patient Stated Goal: go home and get walking  like I used to. Progress towards PT goals: Progressing toward goals    Frequency    Min 3X/week      PT Plan Discharge plan needs to be updated    Co-evaluation              AM-PAC PT "6 Clicks" Mobility   Outcome Measure  Help needed turning from your back to your side while in a flat bed without using bedrails?: None Help needed moving from lying on your back to sitting on the side of a flat bed without using bedrails?: None Help needed moving to and from a bed to a chair (including a wheelchair)?: A Little Help needed standing up from a chair using your arms (e.g., wheelchair or bedside chair)?: A Little Help needed to walk in hospital room?: A Little Help needed climbing 3-5 steps with a railing? : A  Little 6 Click Score: 20    End of Session Equipment Utilized During Treatment: Gait belt Activity Tolerance: Patient tolerated treatment well Patient left: with call bell/phone within reach;in chair Nurse Communication: Mobility status PT Visit Diagnosis: Unsteadiness on feet (R26.81);Difficulty in walking, not elsewhere classified (R26.2)     Time: 7867-6720 PT Time Calculation (min) (ACUTE ONLY): 15 min  Charges:  $Gait Training: 8-22 mins                     [place] [facility] [place] 02/23/2022, 1:05 PM

## 2022-02-24 ENCOUNTER — Other Ambulatory Visit: Payer: Self-pay | Admitting: Cardiology

## 2022-02-24 ENCOUNTER — Other Ambulatory Visit (HOSPITAL_COMMUNITY): Payer: Self-pay

## 2022-02-24 DIAGNOSIS — I5021 Acute systolic (congestive) heart failure: Secondary | ICD-10-CM

## 2022-02-24 LAB — BASIC METABOLIC PANEL
Anion gap: 9 (ref 5–15)
BUN: 29 mg/dL — ABNORMAL HIGH (ref 8–23)
CO2: 25 mmol/L (ref 22–32)
Calcium: 8.7 mg/dL — ABNORMAL LOW (ref 8.9–10.3)
Chloride: 106 mmol/L (ref 98–111)
Creatinine, Ser: 1.52 mg/dL — ABNORMAL HIGH (ref 0.61–1.24)
GFR, Estimated: 45 mL/min — ABNORMAL LOW (ref >60)
Glucose, Bld: 109 mg/dL — ABNORMAL HIGH (ref 70–99)
Potassium: 4.2 mmol/L (ref 3.5–5.1)
Sodium: 140 mmol/L (ref 135–145)

## 2022-02-24 LAB — GLUCOSE, CAPILLARY
Glucose-Capillary: 121 mg/dL — ABNORMAL HIGH (ref 70–99)
Glucose-Capillary: 120 mg/dL — ABNORMAL HIGH (ref 70–99)

## 2022-02-24 MED ORDER — ATORVASTATIN CALCIUM 80 MG PO TABS
80.0000 mg | ORAL_TABLET | Freq: Every day | ORAL | 3 refills | Status: DC
  Filled 2022-02-24: qty 90, 90d supply, fill #0

## 2022-02-24 MED ORDER — NITROGLYCERIN 0.4 MG SL SUBL
0.4000 mg | SUBLINGUAL_TABLET | SUBLINGUAL | 12 refills | Status: AC | PRN
  Filled 2022-02-24: qty 25, 7d supply, fill #0

## 2022-02-24 MED ORDER — SPIRONOLACTONE 25 MG PO TABS
12.5000 mg | ORAL_TABLET | Freq: Every day | ORAL | 3 refills | Status: DC
  Filled 2022-02-24: qty 45, 90d supply, fill #0

## 2022-02-24 MED ORDER — DAPAGLIFLOZIN PROPANEDIOL 10 MG PO TABS
10.0000 mg | ORAL_TABLET | Freq: Every day | ORAL | 3 refills | Status: DC
  Filled 2022-02-24: qty 30, 30d supply, fill #0

## 2022-02-24 MED ORDER — METOPROLOL SUCCINATE ER 50 MG PO TB24
50.0000 mg | ORAL_TABLET | Freq: Every day | ORAL | 3 refills | Status: DC
  Filled 2022-02-24: qty 90, 90d supply, fill #0

## 2022-02-24 MED ORDER — EMPAGLIFLOZIN 10 MG PO TABS
10.0000 mg | ORAL_TABLET | Freq: Every day | ORAL | 3 refills | Status: DC
  Filled 2022-02-24: qty 30, 30d supply, fill #0

## 2022-02-24 MED ORDER — ASPIRIN 81 MG PO TBEC: 81.0000 mg | DELAYED_RELEASE_TABLET | Freq: Every day | ORAL | 12 refills | Status: DC

## 2022-02-24 MED ORDER — FUROSEMIDE 40 MG PO TABS
40.0000 mg | ORAL_TABLET | Freq: Every day | ORAL | 3 refills | Status: DC
  Filled 2022-02-24: qty 90, 90d supply, fill #0

## 2022-02-24 MED ORDER — AMIODARONE HCL 200 MG PO TABS
200.0000 mg | ORAL_TABLET | Freq: Every day | ORAL | 3 refills | Status: DC
  Filled 2022-02-24: qty 90, 90d supply, fill #0

## 2022-02-24 NOTE — TOC Initial Note (Signed)
Transition of Care [facility]) - Initial/Assessment Note    Patient Details  Name: Antonio Clark MRN: [id number] Date of Birth: [birth year]  Transition of [facility]) CM/SW Contact:    Bethena Roys, RN Phone Number: 02/24/2022, 11:09 AM  Clinical Narrative: Case Manager spoke with caregiver [name] regarding home health needs. Case Manager discussed Medicare. Gov list with the caregiver and she chose [facility]- unable to accept the patient. [facility] unable to staff [place]. Alvis Lemmings will be able to accept the patient for services and start of care to begin within 24-48 hours post transition home. Kim to provide transportation home in private vehicle. No further needs identified by Case Manager.   Expected Discharge Plan: [place] Barriers to Discharge: No Barriers Identified   Patient Goals and CMS Choice Patient states their goals for this hospitalization and ongoing recovery are:: to return home. CMS Medicare.gov Compare Post Acute Care list provided to:: Patient Represenative (must comment) [name] Caregiver) Choice offered to / list presented to : Patient, HC POA / Guardian [name] Caregiver)  Expected Discharge Plan and Services Expected Discharge Plan: [place] In-house Referral: NA Discharge Planning Services: CM Consult Post Acute Care Choice: Durable Medical Equipment, Home Health Living arrangements for the past 2 months: Single Family Home                 DME Arranged: Shower stool DME Agency: [employer] Date DME Agency Contacted: 02/24/22 Time DME Agency Contacted: 82 Representative spoke with at DME Agency: Annie [facility] HH Arranged: PT, OT   Date [place] Agency Contacted: 02/24/22 Time [place]: 44 Representative spoke with at [place]: [place] Arrangements/Services Living arrangements for the past 2 months: [place] with:: Other (Comment) (Caregiver Kim) Patient  language and need for interpreter reviewed:: Yes Do you feel safe going back to the place where you live?: Yes      Need for Family Participation in Patient Care: Yes (Comment) Care giver support system in place?: Yes (comment)   Criminal Activity/Legal Involvement Pertinent to Current Situation/Hospitalization: No - Comment as needed  Activities of Daily Living Home Assistive Devices/Equipment: None ADL Screening (condition at time of admission) Patient's cognitive ability adequate to safely complete daily activities?: Yes Is the patient deaf or have difficulty hearing?: No Does the patient have difficulty seeing, even when wearing glasses/contacts?: No Does the patient have difficulty concentrating, remembering, or making decisions?: No Patient able to express need for assistance with ADLs?: Yes Does the patient have difficulty dressing or bathing?: No Independently performs ADLs?: Yes (appropriate for developmental age) Does the patient have difficulty walking or climbing stairs?: No Weakness of Legs: None Weakness of Arms/Hands: None  Permission Sought/Granted Permission sought to share information with : Case Manager, Customer service manager, Family Supports Permission granted to share information with : Yes, Verbal Permission Granted     Permission granted to share info w AGENCY: [employer], [facility]        Emotional Assessment Appearance:: Appears stated age Attitude/Demeanor/Rapport: Engaged Affect (typically observed): Appropriate Orientation: : Oriented to Self Alcohol / Substance Use: Not Applicable Psych Involvement: No (comment)  Admission diagnosis:  Unstable angina ([place]) [I20.0] Patient Active Problem List   Diagnosis Date Noted   Acute on chronic systolic heart failure ([place])    AKI (acute kidney injury) ([place])    Unstable angina ([place]) 02/11/2022   Acute HFrEF (heart failure with reduced ejection fraction) ([place])  Paroxysmal atrial fibrillation with rapid  ventricular response (H[facility]) 02/09/2022   Acute CHF (congestive heart failure) ([place]) 02/08/2022   Type 2 diabetes mellitus with complication, without long-term current use of insulin ([place]) 02/08/2022   Hypertensive urgency 02/08/2022   Arthritis of hand 09/02/2021   Olecranon bursitis 08/18/2021   Pain in joint of right elbow 08/14/2021   Imbalance 08/06/2021   Pain in joint of right hip 07/22/2021   Low back pain 07/22/2021   Fracture of sacrum ([place]) 07/14/2021   Orthostatic hypotension 05/02/2021   Melanoma ([place])    Malignant neoplasm of prostate ([place]) 07/15/2015   Recurrent nephrolithiasis 07/15/2015   Personal history of prostate cancer 04/25/2015   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/09/2013   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

## 2022-02-24 NOTE — Discharge Summary (Addendum)
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           151/109 mmHg Antonio Gender: M             HR:           95 bpm. Exam Location:  [facility] Procedure: 2D Echo, Cardiac Doppler and Color Doppler Indications:     I50.31 CHF acute diastolic  History:         Antonio has prior history of Echocardiogram examinations, most                  recent 04/10/2020. CHF, CAD, History of prostate cancer,                  Arrythmias:Atrial Fibrillation; Risk Factors:Diabetes,                  Dyslipidemia, Hypertension and Sleep Apnea.  Sonographer:  Rosalia Hammers Referring Phys:  [id number] [place] Diagnosing Phys: Kathlyn Sacramento MD IMPRESSIONS  1. Left ventricular ejection fraction, by estimation, is 25 to 30%. The left ventricle has severely decreased function. The left ventricle demonstrates global hypokinesis. The left ventricular internal cavity size was mildly dilated. There is moderate left ventricular hypertrophy. Left ventricular diastolic parameters are indeterminate.  2. Right ventricular systolic function is normal. The right ventricular size is normal. There is mildly elevated pulmonary artery systolic pressure.  3. Left atrial size was moderately dilated.  4. Right atrial size was moderately dilated.  5. The mitral valve is normal in structure. Mild to moderate mitral valve  regurgitation. No evidence of mitral stenosis. Moderate mitral annular calcification.  6. Tricuspid valve regurgitation is moderate.  7. The aortic valve is normal in structure. Aortic valve regurgitation is mild. Aortic valve sclerosis/calcification is present, without any evidence of aortic stenosis.  8. Pulmonic valve regurgitation is moderate.  9. The inferior vena cava is normal in size with <50% respiratory variability, suggesting right atrial pressure of 8 mmHg. FINDINGS  Left Ventricle: Left ventricular ejection fraction, by estimation, is 25 to 30%. The left ventricle has severely decreased function. The left ventricle demonstrates global hypokinesis. The left ventricular internal cavity size was mildly dilated. There is moderate left ventricular hypertrophy. Left ventricular diastolic parameters are indeterminate. Right Ventricle: The right ventricular size is normal. No increase in right ventricular wall thickness. Right ventricular systolic function is normal. There is mildly elevated pulmonary artery systolic pressure. The tricuspid regurgitant velocity is 2.99  m/s, and with an assumed right atrial pressure of 8 mmHg, the estimated right ventricular systolic pressure is 67.3 mmHg. Left Atrium: Left atrial size was moderately dilated. Right Atrium: Right atrial size was moderately dilated. Pericardium: There is no evidence of pericardial effusion. Mitral Valve: The mitral valve is normal in structure. Moderate mitral annular calcification. Mild to moderate mitral valve regurgitation. No evidence of mitral valve stenosis. Tricuspid Valve: The tricuspid valve is normal in structure. Tricuspid valve regurgitation is moderate . No evidence of tricuspid stenosis. Aortic Valve: The aortic valve is normal in structure. Aortic valve regurgitation is mild. Aortic regurgitation PHT measures 470 msec. Aortic valve sclerosis/calcification is present, without any evidence of aortic stenosis. Aortic valve mean  gradient measures 2.0 mmHg. Aortic valve peak gradient measures 2.5 mmHg. Aortic valve area, by VTI measures 2.03 cm. Pulmonic Valve: The pulmonic valve was normal in structure. Pulmonic valve regurgitation is moderate. No evidence of pulmonic stenosis. Aorta: The aortic root is normal in size and structure. Venous: The inferior vena cava is normal in size with less than 50% respiratory variability, suggesting right atrial pressure of 8 mmHg. IAS/Shunts: No atrial level shunt detected by color flow Doppler.  LEFT VENTRICLE PLAX 2D LVIDd:         5.18 cm LVIDs:         3.97 cm LV PW:         1.41 cm LV IVS:        1.38 cm LVOT diam:     2.00 cm LV SV:         28 LV SV Index:   14 LVOT Area:     3.14 cm  RIGHT VENTRICLE RV Basal diam:  4.12 cm LEFT ATRIUM             Index        RIGHT ATRIUM           Index LA  diam:        4.20 cm 2.12 cm/m   RA Area:     23.50 cm LA Vol (A2C):   65.4 ml 33.03 ml/m  RA Volume:   76.20 ml  38.48 ml/m LA Vol (A4C):   52.0 ml 26.26 ml/m LA Biplane Vol: 59.6 ml 30.10 ml/m  AORTIC VALVE                    PULMONIC VALVE AV Area (Vmax):    2.07 cm     PR End Diast Vel: 5.20 msec AV Area (Vmean):   1.98 cm AV Area (VTI):     2.03 cm AV Vmax:           78.40 cm/s AV Vmean:          57.400 cm/s AV VTI:            0.136 m AV Peak Grad:      2.5 mmHg AV Mean Grad:      2.0 mmHg LVOT Vmax:         51.70 cm/s LVOT Vmean:        36.200 cm/s LVOT VTI:          0.088 m LVOT/AV VTI ratio: 0.65 AI PHT:            470 msec  AORTA Ao Root diam: 3.50 cm MITRAL VALVE               TRICUSPID VALVE MV Area (PHT): 3.24 cm    TR Peak grad:   35.8 mmHg MV Decel Time: 234 msec    TR Vmax:        299.00 cm/s MV E velocity: 68.20 cm/s                            SHUNTS                            Systemic VTI:  0.09 m                            Systemic Diam: 2.00 cm Kathlyn Sacramento MD Electronically signed by Kathlyn Sacramento MD Signature Date/Time: 02/09/2022/5:10:19 PM    Final    CT Angio Chest PE W  and/or Wo Contrast  Result Date: 02/08/2022 CLINICAL DATA:  Pulmonary embolism (PE) suspected, high prob EXAM: CT ANGIOGRAPHY CHEST WITH CONTRAST TECHNIQUE: Multidetector CT imaging of the chest was performed using the standard protocol during bolus administration of intravenous contrast. Multiplanar CT image reconstructions and MIPs were obtained to evaluate the vascular anatomy. RADIATION DOSE REDUCTION: This exam was performed according to the departmental dose-optimization program which includes automated exposure control, adjustment of the mA and/or kV according to Antonio size and/or use of iterative reconstruction technique. CONTRAST:  75m OMNIPAQUE IOHEXOL 350 MG/ML SOLN COMPARISON:  None Available. FINDINGS: Cardiovascular: Satisfactory opacification of the pulmonary arteries to the segmental level. No evidence of pulmonary embolism. The main pulmonary artery measures 3.1 cm. Prominent heart size. No significant pericardial effusion. The thoracic aorta is normal in caliber. No atherosclerotic plaque of the thoracic aorta. No coronary artery calcifications. Mediastinum/Nodes: No enlarged mediastinal, hilar, or axillary lymph nodes. Thyroid gland, trachea, and esophagus demonstrate no significant findings. Lungs/Pleura: Expiratory phase of respiration with mosaic attenuation of the lungs. No focal consolidation. No pulmonary nodule. No pulmonary mass.  Bilateral small to moderate, right greater than left, pleural effusions. No pneumothorax. Diffuse mild bronchial wall thickening. Upper Abdomen: No acute abnormality. Musculoskeletal: No chest wall abnormality. No suspicious lytic or blastic osseous lesions. No acute displaced fracture. Multilevel degenerative changes of the spine. Review of the MIP images confirms the above findings. IMPRESSION: 1. Bilateral small to moderate, right greater than left, pleural effusions. 2. Bronchial wall thickening suggestive of small airway disease. 3. Cardiomegaly.  Electronically Signed   By: Iven Finn M.D.   On: 02/08/2022 18:44   DG Chest 2 View  Result Date: 02/08/2022 CLINICAL DATA:  sob EXAM: CHEST - 2 VIEW COMPARISON:  None Available. FINDINGS: The heart and mediastinal contours are unchanged. Prominent hilar vasculature. No focal consolidation. Increased interstitial markings. Bilateral trace pleural effusion. No pneumothorax. No acute osseous abnormality.  Right chest wall vascular clips. IMPRESSION: Pulmonary edema.  Bilateral trace pleural effusions. Electronically Signed   By: Iven Finn M.D.   On: 02/08/2022 17:00    Disposition   Pt is being discharged home today in good condition.  Follow-up Plans & Appointments     Follow-up [facility] Follow up.   Why: Shower chair to be delivered to the room prior to transition home Contact information: [place] [address] [phone]         Care, [facility] Follow up.   Specialty: Home Health Services Why: Physical Therapy/Occupational Therapy-office to call with visit times. Contact information: [place] [address] [phone]         [facility] Follow up on 03/03/2022.   Specialty: Cardiology Why: Please present to the office at 11:45 to have labs drawn. There is no need to fast prior to labs Contact information: [address] [place], [place], [place] Follow up on 03/19/2022.   Specialty: Nurse Practitioner Why: Appointment at 9:15 AM Contact information: [place] [address] [phone]                Discharge Instructions     ([place]) Call MD:  Anytime you have any of the following symptoms: 1) 3 pound weight gain in 24 hours or 5 pounds in 1 week 2) shortness of breath, with or without a dry hacking cough 3) swelling in the hands, feet  or stomach 4) if you have to sleep on extra pillows at night in order to breathe.   Complete by: As directed    Call MD for:  difficulty breathing, headache or visual disturbances   Complete by: As directed    Call MD for:  persistant nausea and vomiting   Complete by: As directed    Call MD for:  redness, tenderness, or signs of infection (pain, swelling, redness, odor or green/yellow discharge around incision site)   Complete by: As directed    Call MD for:  severe uncontrolled pain   Complete by: As directed    Diet - low sodium heart healthy   Complete by: As directed    Increase activity slowly   Complete by: As directed        Discharge Medications   Allergies as of 02/24/2022   No Known Allergies      Medication List     STOP taking these medications    losartan 25 MG  tablet Commonly known as: COZAAR   metoprolol tartrate 50 MG tablet Commonly known as: LOPRESSOR       TAKE these medications    acetaminophen 325 MG tablet Commonly known as: TYLENOL Take 2 tablets (650 mg total) by mouth every 6 (six) hours as needed for mild pain (or Fever >/= 101).   acetaminophen 650 MG suppository Commonly known as: TYLENOL Place 1 suppository (650 mg total) rectally every 6 (six) hours as needed for mild pain (or Fever >/= 101).   amiodarone 200 MG tablet Commonly known as: PACERONE Take 1 tablet (200 mg total) by mouth daily. Start taking on: February 25, 2022   aspirin EC 81 MG tablet Take 1 tablet (81 mg total) by mouth daily. Swallow whole. Start taking on: February 25, 2022   atorvastatin 80 MG tablet Commonly known as: LIPITOR Take 1 tablet (80 mg total) by mouth daily. Start taking on: February 25, 2022 What changed:  medication strength how much to take   budesonide-formoterol 160-4.5 MCG/ACT inhaler Commonly known as: SYMBICORT Inhale 2 puffs into the lungs 2 (two) times daily.   cetirizine 10 MG tablet Commonly known as: ZYRTEC TAKE 1 TABLET BY MOUTH  EVERY DAY. NOT COVERED BY INSURANCE.   dabigatran 150 MG Caps capsule Commonly known as: PRADAXA Take 150 mg by mouth 2 (two) times daily.   dapagliflozin propanediol 10 MG Tabs tablet Commonly known as: FARXIGA Take 1 tablet (10 mg total) by mouth daily. Start taking on: February 25, 2022   diclofenac Sodium 1 % Gel Commonly known as: Voltaren Apply 2 g topically 4 (four) times daily.   finasteride 5 MG tablet Commonly known as: PROSCAR Take 5 mg by mouth daily.   fluticasone 50 MCG/ACT nasal spray Commonly known as: FLONASE Place 1 spray into both nostrils daily as needed for allergies or rhinitis.   furosemide 40 MG tablet Commonly known as: LASIX Take 1 tablet (40 mg total) by mouth daily. Start taking on: February 25, 2022   insulin aspart 100 UNIT/ML injection Commonly known as: novoLOG Inject 0-9 Units into the skin 4 (four) times daily -  before meals and at bedtime.   linagliptin 5 MG Tabs tablet Commonly known as: Tradjenta Take 1 tablet (5 mg total) by mouth daily.   LORazepam 0.5 MG tablet Commonly known as: ATIVAN Take 1 tablet (0.5 mg total) by mouth 2 (two) times daily as needed for anxiety.   metoprolol succinate 50 MG 24 hr tablet Commonly known as: TOPROL-XL Take 1 tablet (50 mg total) by mouth daily. Take with or immediately following a meal. Start taking on: February 25, 2022   mupirocin ointment 2 % Commonly known as: BACTROBAN Apply 1 Application topically 2 (two) times daily.   nitroGLYCERIN 0.4 MG SL tablet Commonly known as: NITROSTAT Place 1 tablet (0.4 mg total) under the tongue every 5 (five) minutes x 3 doses as needed for chest pain.   ondansetron 4 MG tablet Commonly known as: ZOFRAN Take 1 tablet (4 mg total) by mouth every 6 (six) hours as needed for nausea.   spironolactone 25 MG tablet Commonly known as: ALDACTONE Take 0.5 tablets (12.5 mg total) by mouth daily. Start taking on: February 25, 2022   traZODone 50 MG tablet Commonly known  as: DESYREL Take 0.5 tablets (25 mg total) by mouth at bedtime as needed for sleep.               Durable Medical Equipment  (From admission, onward)  Start     Ordered   02/24/22 1046  For home use only DME Shower stool  Once        02/24/22 1046               Outstanding Labs/Studies   CMP in 1 week  LFTs and lipid panel in 2-3 months   Duration of Discharge Encounter   Greater than 30 minutes including physician time.  Signed, Margie Billet, PA-C 02/24/2022, 11:35 AM   Personally seen and examined. Agree with above.  Prolonged hospitalization as described above. 86 year old with debility, IVUS left main, PCI deferred Plavix.  EF 20% shortness of breath has improved.  Physical therapy recently helped transition him from rehab to home health.  TEE cardioversion successful.  On amiodarone 200 mg daily on discharge.  Continue to monitor basic metabolic profile.  On spironolactone holding losartan continuing with Farxiga and metoprolol.  On oral Lasix 40 mg a day.  Candee Furbish, MD

## 2022-02-24 NOTE — Care Management Important Message (Signed)
Important Message  Patient Details  Name: Antonio Clark MRN: [id number] Date of Birth: 15-Mar-1936   Medicare Important Message Given:  Yes     Shelda Altes 02/24/2022, 11:19 AM

## 2022-02-26 ENCOUNTER — Ambulatory Visit (INDEPENDENT_AMBULATORY_CARE_PROVIDER_SITE_OTHER): Payer: Medicare Other | Admitting: Pharmacist

## 2022-02-26 DIAGNOSIS — I5023 Acute on chronic systolic (congestive) heart failure: Secondary | ICD-10-CM

## 2022-02-26 DIAGNOSIS — E118 Type 2 diabetes mellitus with unspecified complications: Secondary | ICD-10-CM

## 2022-02-27 ENCOUNTER — Ambulatory Visit: Payer: Medicare Other | Attending: Family | Admitting: Family

## 2022-02-27 ENCOUNTER — Encounter: Payer: Self-pay | Admitting: Family

## 2022-02-27 VITALS — BP 112/65 | HR 71 | Resp 16 | Ht 69.0 in | Wt 169.0 lb

## 2022-02-27 DIAGNOSIS — E119 Type 2 diabetes mellitus without complications: Secondary | ICD-10-CM | POA: Diagnosis not present

## 2022-02-27 DIAGNOSIS — G4733 Obstructive sleep apnea (adult) (pediatric): Secondary | ICD-10-CM

## 2022-02-27 DIAGNOSIS — E785 Hyperlipidemia, unspecified: Secondary | ICD-10-CM | POA: Insufficient documentation

## 2022-02-27 DIAGNOSIS — I2510 Atherosclerotic heart disease of native coronary artery without angina pectoris: Secondary | ICD-10-CM | POA: Diagnosis not present

## 2022-02-27 DIAGNOSIS — Z8546 Personal history of malignant neoplasm of prostate: Secondary | ICD-10-CM | POA: Insufficient documentation

## 2022-02-27 DIAGNOSIS — I130 Hypertensive heart and chronic kidney disease with heart failure and stage 1 through stage 4 chronic kidney disease, or unspecified chronic kidney disease: Secondary | ICD-10-CM | POA: Diagnosis present

## 2022-02-27 DIAGNOSIS — Z7984 Long term (current) use of oral hypoglycemic drugs: Secondary | ICD-10-CM | POA: Diagnosis not present

## 2022-02-27 DIAGNOSIS — K219 Gastro-esophageal reflux disease without esophagitis: Secondary | ICD-10-CM | POA: Diagnosis not present

## 2022-02-27 DIAGNOSIS — I480 Paroxysmal atrial fibrillation: Secondary | ICD-10-CM | POA: Diagnosis not present

## 2022-02-27 DIAGNOSIS — N189 Chronic kidney disease, unspecified: Secondary | ICD-10-CM | POA: Diagnosis not present

## 2022-02-27 DIAGNOSIS — I10 Essential (primary) hypertension: Secondary | ICD-10-CM | POA: Diagnosis not present

## 2022-02-27 DIAGNOSIS — I5022 Chronic systolic (congestive) heart failure: Secondary | ICD-10-CM | POA: Diagnosis not present

## 2022-02-27 DIAGNOSIS — G4730 Sleep apnea, unspecified: Secondary | ICD-10-CM | POA: Diagnosis not present

## 2022-02-27 DIAGNOSIS — I4891 Unspecified atrial fibrillation: Secondary | ICD-10-CM | POA: Insufficient documentation

## 2022-02-27 DIAGNOSIS — I272 Pulmonary hypertension, unspecified: Secondary | ICD-10-CM | POA: Diagnosis not present

## 2022-02-27 DIAGNOSIS — Z85820 Personal history of malignant melanoma of skin: Secondary | ICD-10-CM | POA: Diagnosis not present

## 2022-02-27 DIAGNOSIS — E1122 Type 2 diabetes mellitus with diabetic chronic kidney disease: Secondary | ICD-10-CM | POA: Diagnosis not present

## 2022-02-27 NOTE — Patient Instructions (Signed)
Continue weighing daily and call for an overnight weight gain of 3 pounds or more or a weekly weight gain of more than 5 pounds.   If you have voicemail, please make sure your mailbox is cleaned out so that we may leave a message and please make sure to listen to any voicemails.     

## 2022-02-27 NOTE — Patient Instructions (Addendum)
Visit Information   Goals Addressed             This Visit's Progress    Track and Manage Fluids and Swelling-Heart Failure       Timeframe:  Long-Range Goal Priority:  High Start Date:   02/27/22                          Expected End Date:   08/29/21                    Follow Up Date 05/30/22/    - call office if I gain more than 2 pounds in one day or 5 pounds in one week - keep legs up while sitting - use salt in moderation    Why is this important?   It is important to check your weight daily and watch how much salt and liquids you have.  It will help you to manage your heart failure.    Notes:        Patient Care Plan: General Pharmacy (Adult)     Problem Identified: HF, DM   Priority: High  Onset Date: 02/27/2022  Note:    Current Barriers:  Recent hospital admission for HF  Pharmacist Clinical Goal(s):  Patient will maintain control of fluid status as evidenced by swelling/sob  through collaboration with PharmD and provider.   Interventions: 1:1 collaboration with Susy Frizzle, MD regarding development and update of comprehensive plan of care as evidenced by provider attestation and co-signature Inter-disciplinary care team collaboration (see longitudinal plan of care) Comprehensive medication review performed; medication list updated in electronic medical record  Diabetes (A1c goal <7%) 02/26/22 -Controlled -Current medications: Tradjenta '5mg'$  Appropriate, Effective, Safe, Accessible Jardiance '10mg'$  daily Appropriate, Effective, Safe, Accessible -Medications previously tried: Novolog  -Current home glucose readings fasting glucose: not checking currently post prandial glucose: not checking currently -Denies hypoglycemic/hyperglycemic symptoms -Current exercise: minimal, currently.  Recently returned back from the hospital. -Educated on A1c and blood sugar goals; Complications of diabetes including kidney damage, retinal damage, and cardiovascular  disease; Prevention and management of hypoglycemic episodes; Benefits of routine self-monitoring of blood sugar; -Counseled to check feet daily and get yearly eye exams -Counseled on diet and exercise extensively Recommended to continue current medication New start of Jardiance '10mg'$  due to comorbidity of HF.  Monitor glucose and A1c.  IF A1c decreases or stays steady, would recommend increasing Jardiance to '25mg'$  and stopping tradjenta or just stopping Tradjenta.  HF diagnosis make Jardiance most appropriate medication for patient.  Heart Failure (Goal: manage symptoms and prevent exacerbations) -Controlled -Last ejection fraction: <20%  (Date: 02/11/22) -Current treatment: Spironolactone '25mg'$  Appropriate, Effective, Safe, Accessible Metoprolol XL '50mg'$  Appropriate, Effective, Safe, Accessible Jardiance '10mg'$  Appropriate, Effective, Safe, Accessible -Medications previously tried: none noted  -Current home BP/HR readings: BP has been controlled -Current dietary habits: watching salt -Current exercise habits: see above -Educated on Benefits of medications for managing symptoms and prolonging life Importance of weighing daily; if you gain more than 3 pounds in one day or 5 pounds in one week, contact providers -Counseled on diet and exercise extensively Recommended to continue current medication Was not monitoring weight at this time, they will start doing so. Will set up with routine follow up to make sure they are monitoring weight to prevent readmission.  Patient Goals/Self-Care Activities Patient will:  - take medications as prescribed as evidenced by patient report and record review weigh daily, and contact  provider if weight gain of 3 lbs in one day or 5 lbs in one week.  Follow Up Plan: The care management team will reach out to the patient again over the next 180 days.       The patient verbalized understanding of instructions, educational materials, and care plan provided today and  DECLINED offer to receive copy of patient instructions, educational materials, and care plan.  Telephone follow up appointment with pharmacy team member scheduled for: 6 months  Edythe Clarity, [place], PharmD, [place] Clinical Pharmacist Practitioner [place] [phone]

## 2022-02-27 NOTE — Progress Notes (Signed)
Patient ID: Antonio Clark, male    DOB: 1936/05/22, 86 y.o.   MRN: [id number]  HPI  Antonio Clark is a 86 y/o male with a history of CAD, DM, hyperlipidemia, HTN, CKD, atrial fibrillation, GERD, melanoma, prostate cancer, sleep apnea and chronic heart failure.   Echo report from 02/17/22 reviewed and showed an EF of <20% along with severe LAE/RAE, mild/moderate Antonio, severe TR and mild AR.   Coronary angiography done 02/13/22: Intravascular ultrasound demonstrates moderate eccentric calcific left main stenosis with a minimal lumen area of approximately 12 mm.  PCI is deferred. IVUS findings: There is concentric LAD calcification with no stenosis.  There is mild nonobstructive plaquing in the distal and midportion of the left main.  There is moderate eccentric calcified stenosis at the ostium of the left main with a minimal lumen area of 12 mm.  Transferred from OSH 02/11/22 due to revascularization of the ostial/proximal left main. Nephrology, EP & cardiology consults obtained.Transitioned to oral diuretics. Underwent TEE guided DCCV on 02/17/22. PT/OT evaluations done. Discharged after 13 days. Admitted 02/08/22 due to shortness of breath and dry cough. Chest CTA was negative for PE with small pleural effusions. Given IV diuretics. Cardiology consult obtained. Antonio Clark underwent R/LHC on the afternoon of 02/11/2022 which demonstrated severe ostial/proximal left main disease with focal, eccentric 70% stenosis as well as 20% proximal/mid LAD stenosis and minimal luminal irregularities within the RCA.  Mildly elevated left heart filling pressures, mild pulmonary hypertension, and mildly reduced cardiac output/index. Transferred to [facility] after 3 days.   Antonio Clark presents today for his initial visit with a chief complaint of moderate fatigue with minimal exertion. Antonio Clark describes this as chronic in nature. Antonio Clark has associated pedal edema along with this. Antonio Clark denies dizziness, difficulty sleeping, abdominal distention, palpitations,  chest pain, wheezing, shortness of breath, cough or weight gain.   Past Medical History:  Diagnosis Date   Anal fissure    Arthritis    Atrial fibrillation (HCC)    Back pain    CHF (congestive heart failure) (HCC)    Colon polyps    Coronary artery disease    Diabetes mellitus type 2 with complications ([place])    Dysrhythmia    a-fib   GERD (gastroesophageal reflux disease)    History of echocardiogram 07/ 07/ 2011   History of lithotripsy 1989   Hyperlipidemia    Hypertension    Kidney stones    Melanoma ([place]) 05/01/2019   right chest wall (12/20)   Prostate CA ([place]) 2010   Sleep apnea    uses CPAP nightly   Squamous cell carcinoma of skin 05/23/1992   bowens-left parietal scalp (CX35FU)   Squamous cell carcinoma of skin 03/14/2008   in situ-left upper outer forehead-medial (CX35FU)   Squamous cell carcinoma of skin 03/14/2008   in situ-crown of scalp (Cx35FU)   Squamous cell carcinoma of skin 04/15/2011   in situ-right dorsal forearm (txpbx)   Squamous cell carcinoma of skin 10/16/2011   in situ-left sideburn   Squamous cell carcinoma of skin 03/11/2015   ka-left sideburn (CX35FU)   Squamous cell carcinoma of skin 03/11/2015   ka-left forearm (CX35FU)   Squamous cell carcinoma of skin 06/22/2018   in situ-left forearm, sup (txpbx)   Squamous cell carcinoma of skin 05/01/2019   in situ-mid anterior scalp    Squamous cell carcinoma of skin 05/01/2019   in situ-right upper arm   Past Surgical History:  Procedure Laterality Date   ATRIAL FIBRILLATION ABLATION  N/A 04/17/2020   Procedure: ATRIAL FIBRILLATION ABLATION;  Surgeon: Vickie Epley, MD;  Location: [place] [facility];  Service: Cardiovascular;  Laterality: N/A;   AXILLARY SENTINEL NODE BIOPSY Right 08/23/2019   Procedure: SENTINEL LYMPH NODE BIOSPY RIGHT AXILLA;  Surgeon: Stark Klein, MD;  Location: [place];  Service: General;  Laterality: Right;   CARDIOVERSION N/A 05/10/2020   Procedure:  CARDIOVERSION;  Surgeon: Freada Bergeron, MD;  Location: [place];  Service: Cardiovascular;  Laterality: N/A;   CARDIOVERSION N/A 05/08/2021   Procedure: CARDIOVERSION;  Surgeon: Donato Heinz, MD;  Location: [place];  Service: Cardiovascular;  Laterality: N/A;   CARDIOVERSION N/A 02/17/2022   Procedure: CARDIOVERSION;  Surgeon: Jerline Pain, MD;  Location: [facility] ENDOSCOPY;  Service: Cardiovascular;  Laterality: N/A;   COLONOSCOPY  2002   CORONARY ANGIOGRAPHY N/A 02/13/2022   Procedure: CORONARY ANGIOGRAPHY;  Surgeon: Sherren Mocha, MD;  Location: [place] [facility];  Service: Cardiovascular;  Laterality: N/A;   FASCIECTOMY Left 03/28/2019   Procedure: SEGMENTAL FASCIECTOMY LEFT RING FINGER;  Surgeon: Daryll Brod, MD;  Location: [place];  Service: Orthopedics;  Laterality: Left;  ANESTHESIA  AXILLARY BLOCK   INSERTION PROSTATE RADIATION SEED  8 4 2010   per Dr Gwenevere Ghazi ULTRASOUND/IVUS N/A 02/13/2022   Procedure: Intravascular Ultrasound/IVUS;  Surgeon: Sherren Mocha, MD;  Location: [place] [facility];  Service: Cardiovascular;  Laterality: N/A;   MELANOMA EXCISION Right 06/22/2019   Procedure: WIDE LOCAL EXCISION RIGHT CHEST WALL MELANOMA, ADVANCEMENT FLAP CLOSURE FOR DEFECT 3X6 CM;  Surgeon: Stark Klein, MD;  Location: [place];  Service: General;  Laterality: Right;   POLYPECTOMY  2002   RIGHT/LEFT HEART CATH AND CORONARY ANGIOGRAPHY N/A 02/11/2022   Procedure: RIGHT/LEFT HEART CATH AND CORONARY ANGIOGRAPHY;  Surgeon: Nelva Bush, MD;  Location: [place] [facility];  Service: Cardiovascular;  Laterality: N/A;   TEE WITHOUT CARDIOVERSION N/A 02/17/2022   Procedure: TRANSESOPHAGEAL ECHOCARDIOGRAM (TEE);  Surgeon: Jerline Pain, MD;  Location: [facility];  Service: Cardiovascular;  Laterality: N/A;   THROAT SURGERY  1980   benign cyst   UPPER GASTROINTESTINAL ENDOSCOPY     URETHRAL STRICTURE DILATATION     also  penile implant   Family History  Problem Relation Age of Onset   Coronary artery disease Brother        3 brothers had CABG   Arrhythmia Brother    Diabetes Brother    Hearing loss Brother    Hypertension Brother    Heart disease Brother    Heart failure Brother    Hypertension Brother    Heart disease Father    Hypertension Father    Sudden death Father    Stroke Mother        cerebral hemorrage   Colon cancer Mother 73       small intestine   Depression Mother    Stroke Sister    Suicidality Sister    Diabetes Brother    Hypertension Brother    Hyperlipidemia Sister    Esophageal cancer Neg Hx    Pancreatic cancer Neg Hx    Prostate cancer Neg Hx    Rectal cancer Neg Hx    Stomach cancer Neg Hx    Social History   Tobacco Use   Smoking status: Never   Smokeless tobacco: Never  Substance Use Topics   Alcohol use: Not Currently    Comment: social   No Known Allergies Prior  to Admission medications   Medication Sig Start Date End Date Taking? Authorizing Provider  acetaminophen (TYLENOL) 325 MG tablet Take 2 tablets (650 mg total) by mouth every 6 (six) hours as needed for mild pain (or Fever >/= 101). 02/11/22  Yes Emeterio Reeve, DO  amiodarone (PACERONE) 200 MG tablet Take 1 tablet (200 mg total) by mouth daily. 02/25/22  Yes Margie Billet, PA-C  aspirin EC 81 MG tablet Take 1 tablet (81 mg total) by mouth daily. Swallow whole. 02/25/22  Yes Margie Billet, PA-C  atorvastatin (LIPITOR) 80 MG tablet Take 1 tablet (80 mg total) by mouth daily. 02/25/22  Yes Margie Billet, PA-C  cetirizine (ZYRTEC) 10 MG tablet TAKE 1 TABLET BY MOUTH EVERY DAY. NOT COVERED BY INSURANCE. 12/16/21  Yes Susy Frizzle, MD  dabigatran (PRADAXA) 150 MG CAPS capsule Take 150 mg by mouth 2 (two) times daily.   Yes [provider]  empagliflozin (JARDIANCE) 10 MG TABS tablet Take 1 tablet (10 mg total) by mouth daily before breakfast. 02/24/22  Yes Martinique, Peter M, MD   finasteride (PROSCAR) 5 MG tablet Take 5 mg by mouth daily. 08/22/21  Yes [provider]  furosemide (LASIX) 40 MG tablet Take 1 tablet (40 mg total) by mouth daily. 02/25/22  Yes Margie Billet, PA-C  linagliptin (TRADJENTA) 5 MG TABS tablet Take 1 tablet (5 mg total) by mouth daily. 10/06/21  Yes Susy Frizzle, MD  LORazepam (ATIVAN) 0.5 MG tablet Take 1 tablet (0.5 mg total) by mouth 2 (two) times daily as needed for anxiety. 01/20/22  Yes Susy Frizzle, MD  metoprolol succinate (TOPROL-XL) 50 MG 24 hr tablet Take 1 tablet (50 mg total) by mouth daily. Take with or immediately following a meal. 02/25/22  Yes Margie Billet, PA-C  nitroGLYCERIN (NITROSTAT) 0.4 MG SL tablet Place 1 tablet (0.4 mg total) under the tongue every 5 (five) minutes x 3 doses as needed for chest pain. 02/24/22  Yes Margie Billet, PA-C  spironolactone (ALDACTONE) 25 MG tablet Take 1/2 tablets (12.5 mg total) by mouth daily. 02/25/22  Yes Margie Billet, PA-C  acetaminophen (TYLENOL) 650 MG suppository Place 1 suppository (650 mg total) rectally every 6 (six) hours as needed for mild pain (or Fever >/= 101). 02/11/22   Emeterio Reeve, DO  budesonide-formoterol [facility]) 160-4.5 MCG/ACT inhaler Inhale 2 puffs into the lungs 2 (two) times daily. Patient not taking: Reported on 02/27/2022 01/20/22   Susy Frizzle, MD  diclofenac Sodium (VOLTAREN) 1 % GEL Apply 2 g topically 4 (four) times daily. Patient not taking: Reported on 02/27/2022 12/29/21   Susy Frizzle, MD  fluticasone [facility]) 50 MCG/ACT nasal spray Place 1 spray into both nostrils daily as needed for allergies or rhinitis. Patient not taking: Reported on 02/27/2022    [provider]  mupirocin ointment (BACTROBAN) 2 % Apply 1 Application topically 2 (two) times daily. Patient not taking: Reported on 02/27/2022 02/03/22   Lavonna Monarch, MD  ondansetron (ZOFRAN) 4 MG tablet Take 1 tablet (4 mg total) by mouth every 6  (six) hours as needed for nausea. Patient not taking: Reported on 02/27/2022 02/11/22   Emeterio Reeve, DO  traZODone (DESYREL) 50 MG tablet Take 0.5 tablets (25 mg total) by mouth at bedtime as needed for sleep. Patient not taking: Reported on 02/27/2022 02/11/22   Emeterio Reeve, DO   Review of Systems  Constitutional:  Positive for fatigue (easily). Negative for appetite change.  HENT:  Negative for congestion, postnasal drip and sore throat.   Eyes: Negative.   Respiratory:  Negative for cough, shortness of breath and wheezing.   Cardiovascular:  Positive for leg swelling (little bit). Negative for chest pain and palpitations.  Gastrointestinal:  Negative for abdominal distention and abdominal pain.  Endocrine: Negative.   Genitourinary: Negative.   Musculoskeletal:  Negative for back pain and neck pain.  Skin: Negative.   Allergic/Immunologic: Negative.   Neurological:  Negative for dizziness and light-headedness.  Hematological:  Negative for adenopathy. Does not bruise/bleed easily.  Psychiatric/Behavioral:  Negative for dysphoric mood and sleep disturbance (sleeping on 1 pillow). The patient is not nervous/anxious.    Vitals:   02/27/22 1309  BP: 112/65  Pulse: 71  Resp: 16  SpO2: 99%  Weight: 169 lb (76.7 kg)  Height: '5\' 9"'$  (1.753 m)   Wt Readings from Last 3 Encounters:  02/27/22 169 lb (76.7 kg)  02/24/22 163 lb 6.4 oz (74.1 kg)  02/11/22 172 lb (78 kg)   Lab Results  Component Value Date   CREATININE 1.52 (H) 02/24/2022   CREATININE 1.43 (H) 02/23/2022   CREATININE 1.58 (H) 02/22/2022   Physical Exam Vitals and nursing note reviewed. Exam conducted with a chaperone present (caregiver).  Constitutional:      Appearance: Normal appearance.  HENT:     Head: Normocephalic and atraumatic.  Cardiovascular:     Rate and Rhythm: Normal rate and regular rhythm.  Pulmonary:     Effort: Pulmonary effort is normal. No respiratory distress.     Breath sounds: No  wheezing or rales.  Abdominal:     General: Abdomen is flat. There is no distension.     Palpations: Abdomen is soft.  Musculoskeletal:        General: No tenderness.     Cervical back: Normal range of motion and neck supple.     Right lower leg: Edema (trace pitting) present.     Left lower leg: No edema.  Skin:    General: Skin is warm and dry.  Neurological:     General: No focal deficit present.     Mental Status: Antonio Clark is alert and oriented to person, place, and time.  Psychiatric:        Mood and Affect: Mood normal.        Behavior: Behavior normal.        Thought Content: Thought content normal.    Assessment & Plan:  1: Chronic heart failure with reduced ejection fraction- - NYHA class III - euvolemic today - weighing daily; reminded to call for an overnight weight gain of > 2 pounds or a weekly weight gain of > 5 pounds - on GDMT of jardiance, metoprolol succinate & spironolactone - losartan had been held due to labile renal function - BNP 02/08/22 was 1751.8  2: HTN- - BP looks good (112/65) - saw PCP ([facility]) 01/20/22 - BMP 02/24/22 reviewed and showed sodium 140, potassium 4.2, creatinine 1.52 and GFR 45 - due to have labs drawn 03/03/22  3: DM- - A1c 02/09/22 was 6.4%  4: Atrial fibrillation- - taking amiodarone and metoprolol   5: CAD- - saw cardiology Marlou Porch) 12/16/21  6: Sleep apnea- - not using CPAP as Antonio Clark didn't like wearing it and couldn't get adjusted to it  Medication list reviewed.   Return in 6 weeks, sooner if needed.

## 2022-03-02 ENCOUNTER — Encounter: Payer: Self-pay | Admitting: Dermatology

## 2022-03-02 ENCOUNTER — Other Ambulatory Visit: Payer: Self-pay | Admitting: *Deleted

## 2022-03-02 ENCOUNTER — Telehealth: Payer: Self-pay

## 2022-03-02 DIAGNOSIS — I5021 Acute systolic (congestive) heart failure: Secondary | ICD-10-CM

## 2022-03-02 DIAGNOSIS — Z79899 Other long term (current) drug therapy: Secondary | ICD-10-CM

## 2022-03-02 NOTE — Telephone Encounter (Signed)
-----   Message from Lavonna Monarch, MD sent at 02/06/2022  5:06 PM EDT ----- 3 of 4 biopsy-proven nonmelanoma skin cancers were treated, can schedule surgery with me if appointments are available for the fourth lesion or can be scheduled at the skin surgery center.

## 2022-03-02 NOTE — Progress Notes (Signed)
Follow-Up Visit   Subjective  Antonio Clark is a 86 y.o. male who presents for the following: Follow-up (6 month follow up, personal history of mm and scc. Right forearm left forearm and left lower leg horn like lesions).  Multiple new enlarging growths scalp, face, arm, leg Location:  Duration:  Quality:  Associated Signs/Symptoms: Modifying Factors:  Severity:  Timing: Context:   Objective  Well appearing patient in no apparent distress; mood and affect are within normal limits. Mid Forehead  Mid Forehead Waxy 1 cm crust, superficial carcinoma       Mid Frontal Scalp Waxy 1.2 cm crust, superficial carcinoma       Left Forearm - Anterior Hornlike 8 mm crust, superficial carcinoma       Left Lower Leg - Anterior Tender hornlike 8 mm crust, carcinoma         A focused examination was performed including it, neck, arm, leg. Relevant physical exam findings are noted in the Assessment and Plan.   Assessment & Plan    Carcinoma in situ of skin of other part of face Mid Forehead  Skin / nail biopsy - Mid Forehead Type of biopsy: tangential   Informed consent: discussed and consent obtained   Timeout: patient name, date of birth, surgical site, and procedure verified   Anesthesia: the lesion was anesthetized in a standard fashion   Anesthetic:  1% lidocaine w/ epinephrine 1-100,000 local infiltration Instrument used: flexible razor blade   Hemostasis achieved with: ferric subsulfate   Outcome: patient tolerated procedure well   Post-procedure details: wound care instructions given    mupirocin ointment (BACTROBAN) 2 % - Mid Forehead Apply 1 Application topically 2 (two) times daily.  Destruction of lesion - Mid Forehead Complexity: simple   Destruction method: electrodesiccation and curettage   Informed consent: discussed and consent obtained   Timeout:  patient name, date of birth, surgical site, and procedure verified Anesthesia: the  lesion was anesthetized in a standard fashion   Anesthetic:  1% lidocaine w/ epinephrine 1-100,000 local infiltration Curettage performed in three different directions: Yes   Curettage cycles:  3 Lesion length (cm):  1 Lesion width (cm):  1 Margin per side (cm):  0 Final wound size (cm):  1 Hemostasis achieved with:  aluminum chloride Outcome: patient tolerated procedure well with no complications   Post-procedure details: wound care instructions given    Specimen 1 - Surgical pathology Differential Diagnosis: scc vs bcc txpbx Check Margins: No  Squamous cell carcinoma of skin of scalp and neck Mid Frontal Scalp  Skin / nail biopsy Type of biopsy: tangential   Informed consent: discussed and consent obtained   Timeout: patient name, date of birth, surgical site, and procedure verified   Anesthesia: the lesion was anesthetized in a standard fashion   Anesthetic:  1% lidocaine w/ epinephrine 1-100,000 local infiltration Instrument used: flexible razor blade   Hemostasis achieved with: ferric subsulfate   Outcome: patient tolerated procedure well   Post-procedure details: wound care instructions given    Destruction of lesion Complexity: simple   Destruction method: electrodesiccation and curettage   Informed consent: discussed and consent obtained   Timeout:  patient name, date of birth, surgical site, and procedure verified Anesthesia: the lesion was anesthetized in a standard fashion   Anesthetic:  1% lidocaine w/ epinephrine 1-100,000 local infiltration Curettage performed in three different directions: Yes   Curettage cycles:  3 Lesion length (cm):  1.5 Lesion width (cm):  1.5 Margin per  side (cm):  0 Final wound size (cm):  1.5 Hemostasis achieved with:  aluminum chloride Outcome: patient tolerated procedure well with no complications   Post-procedure details: wound care instructions given    Specimen 2 - Surgical pathology Differential Diagnosis: scc vs  bcc txpbx Check Margins: No  Related Medications mupirocin ointment (BACTROBAN) 2 % Apply 1 Application topically 2 (two) times daily.  Carcinoma in situ of skin of left forearm Left Forearm - Anterior  Skin / nail biopsy Type of biopsy: tangential   Informed consent: discussed and consent obtained   Timeout: patient name, date of birth, surgical site, and procedure verified   Anesthesia: the lesion was anesthetized in a standard fashion   Anesthetic:  1% lidocaine w/ epinephrine 1-100,000 local infiltration Instrument used: flexible razor blade   Hemostasis achieved with: ferric subsulfate   Outcome: patient tolerated procedure well   Post-procedure details: wound care instructions given    Destruction of lesion Complexity: simple   Destruction method: electrodesiccation and curettage   Informed consent: discussed and consent obtained   Timeout:  patient name, date of birth, surgical site, and procedure verified Anesthesia: the lesion was anesthetized in a standard fashion   Anesthetic:  1% lidocaine w/ epinephrine 1-100,000 local infiltration Curettage performed in three different directions: Yes   Curettage cycles:  3 Lesion length (cm):  1 Lesion width (cm):  1 Margin per side (cm):  0 Final wound size (cm):  1 Hemostasis achieved with:  aluminum chloride Outcome: patient tolerated procedure well with no complications   Post-procedure details: wound care instructions given    Specimen 3 - Surgical pathology Differential Diagnosis: scc vs bcc  Check Margins: No  Neoplasm of uncertain behavior Left Lower Leg - Anterior  Skin / nail biopsy Type of biopsy: tangential   Informed consent: discussed and consent obtained   Timeout: patient name, date of birth, surgical site, and procedure verified   Anesthesia: the lesion was anesthetized in a standard fashion   Anesthetic:  1% lidocaine w/ epinephrine 1-100,000 local infiltration Instrument used: flexible razor blade    Hemostasis achieved with: aluminum chloride and electrodesiccation   Outcome: patient tolerated procedure well   Post-procedure details: wound care instructions given    Specimen 4 - Surgical pathology Differential Diagnosis: scc vs bcc  Check Margins: No      I, Lavonna Monarch, MD, have reviewed all documentation for this visit.  The documentation on 03/02/22 for the exam, diagnosis, procedures, and orders are all accurate and complete.

## 2022-03-02 NOTE — Telephone Encounter (Signed)
Patient's caregiver, Cleon Dew, left message on office voice mail that she was returning phone call about results.  [phone].

## 2022-03-02 NOTE — Telephone Encounter (Signed)
Phone call to patient with his pathology results. Voicemail left for patient to give the office a call back.  ?

## 2022-03-02 NOTE — Telephone Encounter (Signed)
Phone call to [name] (patient's caregiver) with the patient's pathology results. Voicemail left for [name] to give the office a call back.

## 2022-03-03 ENCOUNTER — Other Ambulatory Visit: Payer: Medicare Other

## 2022-03-03 DIAGNOSIS — Z79899 Other long term (current) drug therapy: Secondary | ICD-10-CM

## 2022-03-03 DIAGNOSIS — I5021 Acute systolic (congestive) heart failure: Secondary | ICD-10-CM

## 2022-03-04 LAB — COMPREHENSIVE METABOLIC PANEL
ALT: 64 IU/L — ABNORMAL HIGH (ref 0–44)
AST: 30 IU/L (ref 0–40)
Albumin/Globulin Ratio: 1.5 (ref 1.2–2.2)
Albumin: 4.1 g/dL (ref 3.7–4.7)
Alkaline Phosphatase: 102 IU/L (ref 44–121)
BUN/Creatinine Ratio: 19 (ref 10–24)
BUN: 37 mg/dL — ABNORMAL HIGH (ref 8–27)
Bilirubin Total: 0.6 mg/dL (ref 0.0–1.2)
CO2: 21 mmol/L (ref 20–29)
Calcium: 9.6 mg/dL (ref 8.6–10.2)
Chloride: 100 mmol/L (ref 96–106)
Creatinine, Ser: 1.93 mg/dL — ABNORMAL HIGH (ref 0.76–1.27)
Globulin, Total: 2.7 g/dL (ref 1.5–4.5)
Glucose: 155 mg/dL — ABNORMAL HIGH (ref 70–99)
Potassium: 4.6 mmol/L (ref 3.5–5.2)
Sodium: 140 mmol/L (ref 134–144)
Total Protein: 6.8 g/dL (ref 6.0–8.5)
eGFR: 34 mL/min/{1.73_m2} — ABNORMAL LOW (ref >59)

## 2022-03-05 NOTE — Telephone Encounter (Signed)
-----   Message from Lavonna Monarch, MD sent at 02/06/2022  5:06 PM EDT ----- 3 of 4 biopsy-proven nonmelanoma skin cancers were treated, can schedule surgery with me if appointments are available for the fourth lesion or can be scheduled at the skin surgery center.

## 2022-03-05 NOTE — Telephone Encounter (Signed)
Path to patient. Will send to the skin surgery center for treatment.

## 2022-03-09 DIAGNOSIS — I5023 Acute on chronic systolic (congestive) heart failure: Secondary | ICD-10-CM | POA: Diagnosis not present

## 2022-03-09 DIAGNOSIS — E118 Type 2 diabetes mellitus with unspecified complications: Secondary | ICD-10-CM | POA: Diagnosis not present

## 2022-03-13 NOTE — Progress Notes (Unsigned)
Cardiology Office Note:    Date:  03/13/2022   ID:  Antonio Clark, DOB 08-28-1935, MRN [id number]  PCP:  Susy Frizzle, MD   [facility] Providers Cardiologist:  Candee Furbish, MD Electrophysiologist:  Vickie Epley, MD { Click to update primary MD,subspecialty MD or APP then REFRESH:1}    Referring MD: Susy Frizzle, MD   Chief Complaint: ***  History of Present Illness:    Antonio Clark is a *** 86 y.o. male with a hx of persistent a fib s/p ablation 04/2020 with recurrent a fib s/p DCCV 05/2020 on chronic anticoagulation, type 2 diabetes, hypertension, hyperlipidemia, strong family history of CAD, melanoma, prostate cancer, and sleep apnea on CPAP admitted to [facility] 02/08/22 with acute HFrEF and a fib with RVR.   History of NST 2013 that was normal. Found to have a fib in 2019, initially noted while at a urology appointment. Echo 10/2017 demonstrated EF of 50-55%, no rwma, G2DD, mildly calcified aortic valve leaflets with mild insufficiency, mildly dilated aortic root, trivial mitral regurgitaiton, moderately dilated LA, trivial AI, mild aortic valve sclerosis without evidence of stenosis.  Was evaluated by EP and underwent A-fib ablation in 04/2020 with recurrent A-fib s/p DCCV in 05/2020 requiring 3 shocks.  Seen in the office 12/16/21 reported he did not feel any different following DCCV. Noted to be back in a fib with controlled ventricular response (was in sinus at his A Fib clinic visit 05/2021). Given lack of symptoms and age, rate control strategy was recommended.   Admission 7/2-7/18/23 at [facility] with c/o 2 week history of SOB and dry cough. Cardiology was consulted for acute HFrEF and a-fib with RVR.  He noted chronic stable LE swelling with the right worse than left at baseline.  Denied significant weight gain.  Asymptomatic atrial fibrillation.  Multiple falls recently, with one occurrence of being stuck upside down for approximately 1 hour on his inversion table.  Labs  notable for BNP of 1751, hs troponin 34 with a delta troponin of 35, serum creatinine 1.28, potassium 3.1.  CXR showed pulmonary edema with bilateral trace pleural effusions.  CTA chest was negative for PE with bilateral small to moderate right greater than left pleural effusions, bronchial wall thickening suggestive of small airway disease, cardiomegaly.  EKG showed atrial fibrillation with RVR with aberrancy, 113 bpm, wandering baseline, nonspecific ST-T wave changes.  Echo 02/09/2022 showed EF 25 to 30%, global HK, mildly dilated LV, moderate LVH, normal RV systolic function and ventricular cavity size, mildly elevated PASP, moderate biatrial enlargement, mild to moderate MR, moderate mitral annular calcification, moderate TR, aortic valve sclerosis without evidence of stenosis with an estimated right atrial pressure of 8 mmHg.  He underwent R/LHC on afternoon of 02/11/22 which revealed severe ostial/proximal  LM stenosis as well as 20% proximal/mid LAD stenosis and minimal luminal irregularities within the RCA.  Mildly elevated left heart filling pressures, mild pulmonary hypertension and mildly reduced cardiac output/index.  With recommendation for transfer to [facility] for revascularization of ostial/proximal LM.  He was discharged home with PT and OT 02/24/2022 on Pradaxa 150 mg twice daily, aspirin, metoprolol, atorvastatin, amiodarone Farxiga, spironolactone.  Losartan remains on hold.  Today,  Repeat lipids/lfts 2-3 mo    Past Medical History:  Diagnosis Date   Anal fissure    Arthritis    Atrial fibrillation (HCC)    Back pain    CHF (congestive heart failure) (HCC)    Colon polyps    Coronary artery  disease    Diabetes mellitus type 2 with complications ([place])    Dysrhythmia    a-fib   GERD (gastroesophageal reflux disease)    History of echocardiogram 07/ 07/ 2011   History of lithotripsy 1989   Hyperlipidemia    Hypertension    Kidney stones    Melanoma ([place]) 05/01/2019   right chest  wall (12/20)   Prostate CA ([place]) 2010   Sleep apnea    uses CPAP nightly   Squamous cell carcinoma of skin 05/23/1992   bowens-left parietal scalp (CX35FU)   Squamous cell carcinoma of skin 03/14/2008   in situ-left upper outer forehead-medial (CX35FU)   Squamous cell carcinoma of skin 03/14/2008   in situ-crown of scalp (Cx35FU)   Squamous cell carcinoma of skin 04/15/2011   in situ-right dorsal forearm (txpbx)   Squamous cell carcinoma of skin 10/16/2011   in situ-left sideburn   Squamous cell carcinoma of skin 03/11/2015   ka-left sideburn (CX35FU)   Squamous cell carcinoma of skin 03/11/2015   ka-left forearm (CX35FU)   Squamous cell carcinoma of skin 06/22/2018   in situ-left forearm, sup (txpbx)   Squamous cell carcinoma of skin 05/01/2019   in situ-mid anterior scalp    Squamous cell carcinoma of skin 05/01/2019   in situ-right upper arm    Past Surgical History:  Procedure Laterality Date   ATRIAL FIBRILLATION ABLATION N/A 04/17/2020   Procedure: [place];  Surgeon: Vickie Epley, MD;  Location: [place] [facility];  Service: Cardiovascular;  Laterality: N/A;   AXILLARY SENTINEL NODE BIOPSY Right 08/23/2019   Procedure: SENTINEL LYMPH NODE BIOSPY RIGHT AXILLA;  Surgeon: Stark Klein, MD;  Location: [place];  Service: General;  Laterality: Right;   CARDIOVERSION N/A 05/10/2020   Procedure: CARDIOVERSION;  Surgeon: Freada Bergeron, MD;  Location: [place];  Service: Cardiovascular;  Laterality: N/A;   CARDIOVERSION N/A 05/08/2021   Procedure: CARDIOVERSION;  Surgeon: Donato Heinz, MD;  Location: [place];  Service: Cardiovascular;  Laterality: N/A;   CARDIOVERSION N/A 02/17/2022   Procedure: CARDIOVERSION;  Surgeon: Jerline Pain, MD;  Location: [facility] ENDOSCOPY;  Service: Cardiovascular;  Laterality: N/A;   COLONOSCOPY  2002   CORONARY ANGIOGRAPHY N/A 02/13/2022   Procedure: CORONARY ANGIOGRAPHY;  Surgeon: Sherren Mocha, MD;  Location: [place] [facility];  Service: Cardiovascular;  Laterality: N/A;   FASCIECTOMY Left 03/28/2019   Procedure: SEGMENTAL FASCIECTOMY LEFT RING FINGER;  Surgeon: Daryll Brod, MD;  Location: [place];  Service: Orthopedics;  Laterality: Left;  ANESTHESIA  AXILLARY BLOCK   INSERTION PROSTATE RADIATION SEED  8 4 2010   per Dr Gwenevere Ghazi ULTRASOUND/IVUS N/A 02/13/2022   Procedure: Intravascular Ultrasound/IVUS;  Surgeon: Sherren Mocha, MD;  Location: [place] [facility];  Service: Cardiovascular;  Laterality: N/A;   MELANOMA EXCISION Right 06/22/2019   Procedure: WIDE LOCAL EXCISION RIGHT CHEST WALL MELANOMA, ADVANCEMENT FLAP CLOSURE FOR DEFECT 3X6 CM;  Surgeon: Stark Klein, MD;  Location: [place];  Service: General;  Laterality: Right;   POLYPECTOMY  2002   RIGHT/LEFT HEART CATH AND CORONARY ANGIOGRAPHY N/A 02/11/2022   Procedure: RIGHT/LEFT HEART CATH AND CORONARY ANGIOGRAPHY;  Surgeon: Nelva Bush, MD;  Location: [place] [facility];  Service: Cardiovascular;  Laterality: N/A;   TEE WITHOUT CARDIOVERSION N/A 02/17/2022   Procedure: TRANSESOPHAGEAL ECHOCARDIOGRAM (TEE);  Surgeon: Jerline Pain, MD;  Location: [facility] ENDOSCOPY;  Service: Cardiovascular;  Laterality: N/A;   THROAT SURGERY  1980   benign cyst   UPPER GASTROINTESTINAL ENDOSCOPY     URETHRAL STRICTURE DILATATION     also penile implant    Current Medications: No outpatient medications have been marked as taking for the 03/19/22 encounter (Appointment) with Ann Maki, Lanice Schwab, NP.     Allergies:   Patient has no known allergies.   Social History   Socioeconomic History   Marital status: Single    Spouse name: Not on file   Number of children: 0   Years of education: Not on file   Highest education level: Not on file  Occupational History   Occupation: retired  Tobacco Use   Smoking status: Never   Smokeless tobacco: Never  Vaping Use   Vaping Use: Never  used  Substance and Sexual Activity   Alcohol use: Not Currently    Comment: social   Drug use: No   Sexual activity: Yes  Other Topics Concern   Not on file  Social History Narrative   Not on file   Social Determinants of Health   Financial Resource Strain: Low Risk  (10/30/2021)   Overall Financial Resource Strain (CARDIA)    Difficulty of Paying Living Expenses: Not hard at all  Food Insecurity: No Food Insecurity (10/30/2021)   Hunger Vital Sign    Worried About Running Out of Food in the Last Year: Never true    [place] in the Last Year: Never true  Transportation Needs: No Transportation Needs (10/30/2021)   PRAPARE - Hydrologist (Medical): No    Lack of Transportation (Non-Medical): No  Physical Activity: Inactive (10/30/2021)   Exercise Vital Sign    Days of Exercise per Week: 0 days    Minutes of Exercise per Session: 0 min  Stress: No Stress Concern Present (10/30/2021)   [place]    Feeling of Stress : Not at all  Social Connections: Moderately Integrated (10/30/2021)   Social Connection and Isolation Panel [NHANES]    Frequency of Communication with Friends and Family: More than three times a week    Frequency of Social Gatherings with Friends and Family: More than three times a week    Attends Religious Services: More than 4 times per year    Active Member of [employer] or Organizations: Yes    Attends Music therapist: More than 4 times per year    Marital Status: Never married     Family History: The patient's ***family history includes Arrhythmia in his brother; Colon cancer (age of onset: 43) in his mother; Coronary artery disease in his brother; Depression in his mother; Diabetes in his brother and brother; Hearing loss in his brother; Heart disease in his brother and father; Heart failure in his brother; Hyperlipidemia in his sister; Hypertension in  his brother, brother, brother, and father; Stroke in his mother and sister; Sudden death in his father; Suicidality in his sister. There is no history of Esophageal cancer, Pancreatic cancer, Prostate cancer, Rectal cancer, or Stomach cancer.  ROS:   Please see the history of present illness.    *** All other systems reviewed and are negative.  Labs/Other Studies Reviewed:    The following studies were reviewed today:  Echo TEE/Cardioversion 02/17/22  1. Left ventricular ejection fraction, by estimation, is <20%. The left  ventricle has severely decreased function. The left ventricle demonstrates  global hypokinesis.   2. Right ventricular systolic function is moderately  reduced. The right  ventricular size is normal. There is normal pulmonary artery systolic  pressure. The estimated right ventricular systolic pressure is 03.5 mmHg.   3. Left atrial size was severely dilated. No left atrial/left atrial  appendage thrombus was detected.   4. Right atrial size was severely dilated.   5. The mitral valve is normal in structure. Mild to moderate mitral valve  regurgitation. No evidence of mitral stenosis.   6. Tricuspid valve regurgitation is severe.   7. The aortic valve is tricuspid. There is mild calcification of the  aortic valve. Aortic valve regurgitation is mild. No aortic stenosis is  present.   8. There is Moderate (Grade III) plaque.   9. The inferior vena cava is normal in size with greater than 50%  respiratory variability, suggesting right atrial pressure of 3 mmHg.  10. Agitated saline contrast bubble study was negative, with no evidence  of any interatrial shunt.   Conclusion(s)/Recommendation(s): No LA/LAA thrombus identified. Successful  cardioversion performed with restoration of normal sinus rhythm.    IVUS Coronary angiography 02/13/22  Intravascular ultrasound demonstrates moderate eccentric calcific left main stenosis with a minimal lumen area of approximately 12  mm.  PCI is deferred.   IVUS findings: There is concentric LAD calcification with no stenosis.  There is mild nonobstructive plaquing in the distal and midportion of the left main.  There is moderate eccentric calcified stenosis at the ostium of the left main with a minimal lumen area of 12 mm.   Recommendations:  Discontinue clopidogrel Start amiodarone to control heart rate and facilitate cardioversion in this patient with severe LV dysfunction and atrial fibrillation Consider TEE cardioversion Monday if patient remains in atrial fibrillation Resume heparin in 6 hours     R/LHC 02/11/22  Conclusions: Severe ostial/proximal LMCA disease with focal, eccentric 70% stenosis.  There is also 20% proximal/mid LAD stenosis as well as minimal luminal irregularities in the RCA. Mildly elevated left heart filling pressures (PCWP/LVEDP 20 mmHg). Mild pulmonary hypertension (mean PAP 31 mmHg). Mildly reduced Fick cardiac output/index (CO 4.7 L/min, CI 2.4 L/min/m^2). Relatively small left radial artery by ultrasound with significant vasospasm.  Recommend alternative access for PCI.   Recommendations: Images reviewed with Dr. Martin[place]; we will plan to transfer Mr. January to [facility] for revascularization of ostial/proximal LMCA stenosis.  The lesion appears well-suited to stenting; I would favor IVUS-guided PCI over CABG in the setting of low Syntax score, advanced age, and other comorbidities. Continue diuresis and optimization of GDMT for acute HFrEF. Aggressive secondary prevention of coronary artery disease.    Echo 02/09/22  1. Left ventricular ejection fraction, by estimation, is 25 to 30%. The  left ventricle has severely decreased function. The left ventricle  demonstrates global hypokinesis. The left ventricular internal cavity size  was mildly dilated. There is moderate  left ventricular hypertrophy. Left ventricular diastolic parameters are  indeterminate.   2. Right ventricular  systolic function is normal. The right ventricular  size is normal. There is mildly elevated pulmonary artery systolic  pressure.   3. Left atrial size was moderately dilated.   4. Right atrial size was moderately dilated.   5. The mitral valve is normal in structure. Mild to moderate mitral valve  regurgitation. No evidence of mitral stenosis. Moderate mitral annular  calcification.   6. Tricuspid valve regurgitation is moderate.   7. The aortic valve is normal in structure. Aortic valve regurgitation is  mild. Aortic valve sclerosis/calcification is present, without any  evidence of aortic stenosis.   8. Pulmonic valve regurgitation is moderate.   9. The inferior vena cava is normal in size with <50% respiratory  variability, suggesting right atrial pressure of 8 mmHg.   CTA Chest PE 02/08/22  IMPRESSION: 1. Bilateral small to moderate, right greater than left, pleural effusions. 2. Bronchial wall thickening suggestive of small airway disease. 3. Cardiomegaly.    Recent Labs: 02/08/2022: B Natriuretic Peptide 1,751.8 02/09/2022: TSH 3.207 02/10/2022: Magnesium 1.6 02/22/2022: Hemoglobin 11.8; Platelets 118 03/03/2022: ALT 64; BUN 37; Creatinine, Ser 1.93; Potassium 4.6; Sodium 140  Recent Lipid Panel    Component Value Date/Time   CHOL 103 10/30/2021 1203   TRIG 83 10/30/2021 1203   HDL 48 10/30/2021 1203   CHOLHDL 2.1 10/30/2021 1203   VLDL 34 (H) 01/18/2017 0813   LDLCALC 38 10/30/2021 1203     Risk Assessment/Calculations:    CHA2DS2-VASc Score = 6  This indicates a 9.7% annual risk of stroke. The patient's score is based upon: CHF History: 1 HTN History: 1 Diabetes History: 1 Stroke History: 0 Vascular Disease History: 1 Age Score: 2 Gender Score: 0    Physical Exam:    VS:  There were no vitals taken for this visit.    Wt Readings from Last 3 Encounters:  02/27/22 169 lb (76.7 kg)  02/24/22 163 lb 6.4 oz (74.1 kg)  02/11/22 172 lb (78 kg)     GEN: ***  Well nourished, well developed in no acute distress HEENT: Normal NECK: No JVD; No carotid bruits CARDIAC: ***RRR, no murmurs, rubs, gallops RESPIRATORY:  Clear to auscultation without rales, wheezing or rhonchi  ABDOMEN: Soft, non-tender, non-distended MUSCULOSKELETAL:  No edema; No deformity. *** pedal pulses, ***bilaterally SKIN: Warm and dry NEUROLOGIC:  Alert and oriented x 3 PSYCHIATRIC:  Normal affect   EKG:  EKG is *** ordered today.  The ekg ordered today demonstrates ***  Diagnoses:    No diagnosis found. Assessment and Plan:     CAD Atrial fibrillation HFrEF CKD   {Are you ordering a CV Procedure (e.g. stress test, cath, DCCV, TEE, etc)?   Press F2        :[id number]}    Medication Adjustments/Labs and Tests Ordered: Current medicines are reviewed at length with the patient today.  Concerns regarding medicines are outlined above.  No orders of the defined types were placed in this encounter.  No orders of the defined types were placed in this encounter.   There are no Patient Instructions on file for this visit.   Signed, Emmaline Life, NP  03/13/2022 7:45 AM    [place]

## 2022-03-19 ENCOUNTER — Encounter: Payer: Self-pay | Admitting: Nurse Practitioner

## 2022-03-19 ENCOUNTER — Ambulatory Visit (INDEPENDENT_AMBULATORY_CARE_PROVIDER_SITE_OTHER): Payer: Medicare Other | Admitting: Nurse Practitioner

## 2022-03-19 VITALS — BP 118/64 | HR 67 | Ht 69.0 in | Wt 164.2 lb

## 2022-03-19 DIAGNOSIS — R5383 Other fatigue: Secondary | ICD-10-CM | POA: Diagnosis not present

## 2022-03-19 DIAGNOSIS — I10 Essential (primary) hypertension: Secondary | ICD-10-CM

## 2022-03-19 DIAGNOSIS — I255 Ischemic cardiomyopathy: Secondary | ICD-10-CM

## 2022-03-19 DIAGNOSIS — I5023 Acute on chronic systolic (congestive) heart failure: Secondary | ICD-10-CM

## 2022-03-19 DIAGNOSIS — I2510 Atherosclerotic heart disease of native coronary artery without angina pectoris: Secondary | ICD-10-CM

## 2022-03-19 DIAGNOSIS — E119 Type 2 diabetes mellitus without complications: Secondary | ICD-10-CM

## 2022-03-19 DIAGNOSIS — Z79899 Other long term (current) drug therapy: Secondary | ICD-10-CM | POA: Diagnosis not present

## 2022-03-19 DIAGNOSIS — I4819 Other persistent atrial fibrillation: Secondary | ICD-10-CM

## 2022-03-19 DIAGNOSIS — Z7901 Long term (current) use of anticoagulants: Secondary | ICD-10-CM

## 2022-03-19 DIAGNOSIS — E785 Hyperlipidemia, unspecified: Secondary | ICD-10-CM

## 2022-03-19 DIAGNOSIS — I25110 Atherosclerotic heart disease of native coronary artery with unstable angina pectoris: Secondary | ICD-10-CM | POA: Diagnosis not present

## 2022-03-19 LAB — BASIC METABOLIC PANEL
BUN/Creatinine Ratio: 28 — ABNORMAL HIGH (ref 10–24)
BUN: 53 mg/dL — ABNORMAL HIGH (ref 8–27)
CO2: 19 mmol/L — ABNORMAL LOW (ref 20–29)
Calcium: 9.8 mg/dL (ref 8.6–10.2)
Chloride: 101 mmol/L (ref 96–106)
Creatinine, Ser: 1.92 mg/dL — ABNORMAL HIGH (ref 0.76–1.27)
Glucose: 150 mg/dL — ABNORMAL HIGH (ref 70–99)
Potassium: 5 mmol/L (ref 3.5–5.2)
Sodium: 141 mmol/L (ref 134–144)
eGFR: 34 mL/min/{1.73_m2} — ABNORMAL LOW (ref >59)

## 2022-03-19 MED ORDER — TRAZODONE HCL 50 MG PO TABS: 25.0000 mg | ORAL_TABLET | Freq: Every evening | ORAL | 0 refills | Status: DC | PRN

## 2022-03-19 MED ORDER — ONDANSETRON HCL 4 MG PO TABS: 4.0000 mg | ORAL_TABLET | Freq: Four times a day (QID) | ORAL | 0 refills | Status: DC | PRN

## 2022-03-19 MED ORDER — EMPAGLIFLOZIN 10 MG PO TABS: 10.0000 mg | ORAL_TABLET | Freq: Every day | ORAL | 3 refills | Status: DC

## 2022-03-19 NOTE — Patient Instructions (Signed)
Medication Instructions:  Your physician recommends that you continue on your current medications as directed. Please refer to the Current Medication list given to you today.  *If you need a refill on your cardiac medications before your next appointment, please call your pharmacy*   Lab Work: BMET today If you have labs (blood work) drawn today and your tests are completely normal, you will receive your results only by: [place] (if you have MyChart) OR A paper copy in the mail If you have any lab test that is abnormal or we need to change your treatment, we will call you to review the results.  Follow-Up: At [facility], you and your health needs are our priority.  As part of our continuing mission to provide you with exceptional heart care, we have created designated Provider Care Teams.  These Care Teams include your primary Cardiologist (physician) and Advanced Practice Providers (APPs -  Physician Assistants and Nurse Practitioners) who all work together to provide you with the care you need, when you need it.     Your next appointment:   We will be in touch with you to let you know about plan for follow up  Important Information About [place]

## 2022-03-20 ENCOUNTER — Telehealth: Payer: Self-pay | Admitting: Nurse Practitioner

## 2022-03-20 DIAGNOSIS — I255 Ischemic cardiomyopathy: Secondary | ICD-10-CM

## 2022-03-20 DIAGNOSIS — I5022 Chronic systolic (congestive) heart failure: Secondary | ICD-10-CM

## 2022-03-20 DIAGNOSIS — I4819 Other persistent atrial fibrillation: Secondary | ICD-10-CM

## 2022-03-20 NOTE — Telephone Encounter (Signed)
Left detailed message on main number in chart which is actually the "friend" listed on his DPR: Ileana Ladd. Advised her of Michelle's message and referral to advanced HF clinic and to call if he needed [place] for anything prior to seeing them.

## 2022-03-20 NOTE — Telephone Encounter (Signed)
Would you please contact patient and let him know that I talked with Dr. Marlou Porch and we are going to refer him to [facility] for management. His lab work that we checked yesterday is stable. Encourage him to contact us if he has any worsening symptoms of shortness of breath, chest pain, weight gain prior to his appointment.

## 2022-03-24 ENCOUNTER — Telehealth: Payer: Self-pay | Admitting: Pharmacist

## 2022-03-24 NOTE — Progress Notes (Signed)
Chronic Care Management Pharmacy Assistant   Name: Antonio Clark  MRN: [id number] DOB: 17-Oct-1935   Reason for Encounter: Disease State - Diabetes Call     Recent office visits:  None noted.   Recent consult visits:  03/19/22 Christen Bame NP - Cardiology - Cardiomyopathy - Labs were ordered. EKG performed. Follow up as scheduled.   11/09/26 Blair Hailey - Urology - No changes. Follow up as scheduled.   02/27/22 Darylene Price FNP - CHF - No medication changes. Follow up in 6 weeks.   Hospital visits: 02/09/22-02/11/22 Medication Reconciliation was completed by comparing discharge summary, patient's EMR and Pharmacy list, and upon discussion with patient.  Admitted to the hospital on 02/09/22 due to CHF. Discharge date was 02/11/22. Discharged from [place]?Medications Started at [facility] Discharge:?? None noted.   Medication Changes at Hospital Discharge: None noted  Medications Discontinued at Hospital Discharge: None noted  Medications that remain the same after Hospital Discharge:??  All other medications will remain the same.     Medications: Outpatient Encounter Medications as of 03/24/2022  Medication Sig   acetaminophen (TYLENOL) 325 MG tablet Take 2 tablets (650 mg total) by mouth every 6 (six) hours as needed for mild pain (or Fever >/= 101).   acetaminophen (TYLENOL) 650 MG suppository Place 1 suppository (650 mg total) rectally every 6 (six) hours as needed for mild pain (or Fever >/= 101).   amiodarone (PACERONE) 200 MG tablet Take 1 tablet (200 mg total) by mouth daily.   aspirin EC 81 MG tablet Take 1 tablet (81 mg total) by mouth daily. Swallow whole.   atorvastatin (LIPITOR) 80 MG tablet Take 1 tablet (80 mg total) by mouth daily.   budesonide-formoterol (SYMBICORT) 160-4.5 MCG/ACT inhaler Inhale 2 puffs into the lungs 2 (two) times daily. (Patient not taking: Reported on 02/27/2022)   cetirizine (ZYRTEC) 10 MG tablet TAKE 1  TABLET BY MOUTH EVERY DAY. NOT COVERED BY INSURANCE.   dabigatran (PRADAXA) 150 MG CAPS capsule Take 150 mg by mouth 2 (two) times daily.   diclofenac Sodium (VOLTAREN) 1 % GEL Apply 2 g topically 4 (four) times daily. (Patient not taking: Reported on 03/19/2022)   empagliflozin (JARDIANCE) 10 MG TABS tablet Take 1 tablet (10 mg total) by mouth daily before breakfast.   finasteride (PROSCAR) 5 MG tablet Take 5 mg by mouth daily.   fluticasone (FLONASE) 50 MCG/ACT nasal spray Place 1 spray into both nostrils daily as needed for allergies or rhinitis.   furosemide (LASIX) 40 MG tablet Take 1 tablet (40 mg total) by mouth daily.   linagliptin (TRADJENTA) 5 MG TABS tablet Take 1 tablet (5 mg total) by mouth daily.   LORazepam (ATIVAN) 0.5 MG tablet Take 1 tablet (0.5 mg total) by mouth 2 (two) times daily as needed for anxiety.   metoprolol succinate (TOPROL-XL) 50 MG 24 hr tablet Take 1 tablet (50 mg total) by mouth daily. Take with or immediately following a meal.   mupirocin ointment (BACTROBAN) 2 % Apply 1 Application topically 2 (two) times daily.   nitroGLYCERIN (NITROSTAT) 0.4 MG SL tablet Place 1 tablet (0.4 mg total) under the tongue every 5 (five) minutes x 3 doses as needed for chest pain.   ondansetron (ZOFRAN) 4 MG tablet Take 1 tablet (4 mg total) by mouth every 6 (six) hours as needed for nausea.   spironolactone (ALDACTONE) 25 MG tablet Take 1/2 tablets (12.5 mg total) by mouth daily.   traZODone (  DESYREL) 50 MG tablet Take 0.5 tablets (25 mg total) by mouth at bedtime as needed for sleep.   No facility-administered encounter medications on file as of 03/24/2022.    Current antihyperglycemic regimen:  Tradjenta '5mg'$  Appropriate, Effective, Safe, Accessible Jardiance '10mg'$  daily  What recent interventions/DTPs have been made to improve glycemic control:  Patient caregiver reported no recent medication changes.  Have there been any recent hospitalizations or ED visits since last visit  with CPP?  Patient had a hospitalization on 02/09/22-02/11/22 for CHF.  Patient denies hypoglycemic symptoms, including Pale, Sweaty, Shaky, Hungry, Nervous/irritable, and Vision changes   Patient denies hyperglycemic symptoms, including blurry vision, excessive thirst, fatigue, polyuria, and weakness   How often are you checking your blood sugar? Patient has not been checking blood sugars and reports he has never had an meter.Patients caregiver would like one.    What are your blood sugars ranging?  Fasting:  Before meals:  After meals:  Bedtime:   During the week, how often does your blood glucose drop below 70?  Patient reported they have not been able to check without a meter.   Are you checking your feet daily/regularly? Patient reported    Adherence Review: Is the patient currently on a STATIN medication? Yes Is the patient currently on ACE/ARB medication? No Does the patient have >5 day gap between last estimated fill dates? No   Care Gaps  AWV: done 10/30/21 Colonoscopy: done 10/11/20 DM Eye Exam: unknown DM Foot Exam: due 08/16/20 Microalbumin: done 12/17/20 HbgAIC: done 02/09/22 (6.4) DEXA: N/A Mammogram: NA     Star Rating Drugs: Atorvastatin (LIPITOR) 40 MG tablet - last filled 03/06/22 90 days Linagliptin (TRADJENTA) 5 MG TABS tablet - last filled 12/28/21 90 days    Future Appointments  Date Time Provider [place]  04/08/2022 12:30 PM Alisa Graff, FNP [facility] None  09/03/2022  2:00 PM [facility]M PHARMACIST [facility]  11/05/2022 11:15 AM [facility]-NURSE HEALTH ADVISOR [facility]   Patient caregiver reported he is currently doing great and saw the heart failure clinic this week and was released to Dr Marlou Porch. He is weighing daily and doing well.    Jobe Gibbon, [facility] Clinical Pharmacist Assistant  [phone]

## 2022-04-08 ENCOUNTER — Encounter: Payer: Self-pay | Admitting: Family

## 2022-04-08 ENCOUNTER — Ambulatory Visit: Payer: Medicare Other | Attending: Family | Admitting: Family

## 2022-04-08 VITALS — BP 124/56 | HR 74 | Resp 16 | Ht 69.0 in | Wt 168.0 lb

## 2022-04-08 DIAGNOSIS — N189 Chronic kidney disease, unspecified: Secondary | ICD-10-CM | POA: Diagnosis not present

## 2022-04-08 DIAGNOSIS — G4733 Obstructive sleep apnea (adult) (pediatric): Secondary | ICD-10-CM

## 2022-04-08 DIAGNOSIS — Z85820 Personal history of malignant melanoma of skin: Secondary | ICD-10-CM | POA: Insufficient documentation

## 2022-04-08 DIAGNOSIS — I2510 Atherosclerotic heart disease of native coronary artery without angina pectoris: Secondary | ICD-10-CM | POA: Diagnosis not present

## 2022-04-08 DIAGNOSIS — K219 Gastro-esophageal reflux disease without esophagitis: Secondary | ICD-10-CM | POA: Diagnosis not present

## 2022-04-08 DIAGNOSIS — E785 Hyperlipidemia, unspecified: Secondary | ICD-10-CM | POA: Diagnosis not present

## 2022-04-08 DIAGNOSIS — I5022 Chronic systolic (congestive) heart failure: Secondary | ICD-10-CM | POA: Insufficient documentation

## 2022-04-08 DIAGNOSIS — I130 Hypertensive heart and chronic kidney disease with heart failure and stage 1 through stage 4 chronic kidney disease, or unspecified chronic kidney disease: Secondary | ICD-10-CM | POA: Diagnosis present

## 2022-04-08 DIAGNOSIS — E1122 Type 2 diabetes mellitus with diabetic chronic kidney disease: Secondary | ICD-10-CM | POA: Insufficient documentation

## 2022-04-08 DIAGNOSIS — Z8546 Personal history of malignant neoplasm of prostate: Secondary | ICD-10-CM | POA: Insufficient documentation

## 2022-04-08 DIAGNOSIS — G4730 Sleep apnea, unspecified: Secondary | ICD-10-CM | POA: Insufficient documentation

## 2022-04-08 DIAGNOSIS — I4891 Unspecified atrial fibrillation: Secondary | ICD-10-CM | POA: Insufficient documentation

## 2022-04-08 DIAGNOSIS — Z7901 Long term (current) use of anticoagulants: Secondary | ICD-10-CM | POA: Diagnosis not present

## 2022-04-08 DIAGNOSIS — I480 Paroxysmal atrial fibrillation: Secondary | ICD-10-CM

## 2022-04-08 DIAGNOSIS — E119 Type 2 diabetes mellitus without complications: Secondary | ICD-10-CM

## 2022-04-08 DIAGNOSIS — I10 Essential (primary) hypertension: Secondary | ICD-10-CM | POA: Diagnosis not present

## 2022-04-08 NOTE — Patient Instructions (Addendum)
Continue weighing daily and call for an overnight weight gain of 3 pounds or more or a weekly weight gain of more than 5 pounds.  Continue low-sodium diet.  Follow-up as needed.

## 2022-04-08 NOTE — Progress Notes (Signed)
Patient ID: Antonio Clark, male    DOB: [birth year], 86 y.o.   MRN: [id number]  HPI  Antonio Clark is a 86 y/o male with a history of CAD, DM, hyperlipidemia, HTN, CKD, atrial fibrillation, GERD, melanoma, prostate cancer, sleep apnea and chronic heart failure.   Echo report from 02/17/22 reviewed and showed an EF of <20% along with severe LAE/RAE, mild/moderate Antonio, severe TR and mild AR.   Coronary angiography done 02/13/22: Intravascular ultrasound demonstrates moderate eccentric calcific left main stenosis with a minimal lumen area of approximately 12 mm.  PCI is deferred. IVUS findings: There is concentric LAD calcification with no stenosis.  There is mild nonobstructive plaquing in the distal and midportion of the left main.  There is moderate eccentric calcified stenosis at the ostium of the left main with a minimal lumen area of 12 mm.  Transferred from OSH 02/11/22 due to revascularization of the ostial/proximal left main. Nephrology, EP & cardiology consults obtained.Transitioned to oral diuretics. Underwent TEE guided DCCV on 02/17/22. PT/OT evaluations done. Discharged after 13 days. Admitted 02/08/22 due to shortness of breath and dry cough. Chest CTA was negative for PE with small pleural effusions. Given IV diuretics. Cardiology consult obtained. He underwent R/LHC on the afternoon of 02/11/2022 which demonstrated severe ostial/proximal left main disease with focal, eccentric 70% stenosis as well as 20% proximal/mid LAD stenosis and minimal luminal irregularities within the RCA.  Mildly elevated left heart filling pressures, mild pulmonary hypertension, and mildly reduced cardiac output/index. Transferred to [facility] after 3 days.   He presents today for a follow-up visit with a chief complaint of moderate fatigue with minimal exertion. He describes this as chronic in nature. Denies dizziness, headaches, SOB, cough, chest pain/pressure, abdominal distention, constipation, diarrhea, nor swelling  in lower extremities.   Tries and follow a low-sodium diet but still adds salt. Drinks mostly water or glucerna in the AM. Does not monitor blood sugar because he says he needs a glucometer. Does weigh daily and checks blood pressures twice daily.   Past Medical History:  Diagnosis Date   Anal fissure    Arthritis    Atrial fibrillation (HCC)    Back pain    CHF (congestive heart failure) (HCC)    Colon polyps    Coronary artery disease    Diabetes mellitus type 2 with complications ([place])    Dysrhythmia    a-fib   GERD (gastroesophageal reflux disease)    History of echocardiogram 07/ 07/ 2011   History of lithotripsy 1989   Hyperlipidemia    Hypertension    Kidney stones    Melanoma ([place]) 05/01/2019   right chest Antonio Clark (12/20)   Prostate CA ([place]) 2010   Sleep apnea    uses CPAP nightly   Squamous cell carcinoma of skin 05/23/1992   bowens-left parietal scalp (CX35FU)   Squamous cell carcinoma of skin 03/14/2008   in situ-left upper outer forehead-medial (CX35FU)   Squamous cell carcinoma of skin 03/14/2008   in situ-crown of scalp (Cx35FU)   Squamous cell carcinoma of skin 04/15/2011   in situ-right dorsal forearm (txpbx)   Squamous cell carcinoma of skin 10/16/2011   in situ-left sideburn   Squamous cell carcinoma of skin 03/11/2015   ka-left sideburn (CX35FU)   Squamous cell carcinoma of skin 03/11/2015   ka-left forearm ([id number])   Squamous cell carcinoma of skin 06/22/2018   in situ-left forearm, sup (txpbx)   Squamous cell carcinoma of skin 05/01/2019  in situ-mid anterior scalp    Squamous cell carcinoma of skin 05/01/2019   in situ-right upper arm   Past Surgical History:  Procedure Laterality Date   ATRIAL FIBRILLATION ABLATION N/A 04/17/2020   Procedure: ATRIAL FIBRILLATION ABLATION;  Surgeon: Vickie Epley, MD;  Location: [place] [facility];  Service: Cardiovascular;  Laterality: N/A;   AXILLARY SENTINEL NODE BIOPSY Right 08/23/2019   Procedure:  SENTINEL LYMPH NODE BIOSPY RIGHT AXILLA;  Surgeon: Stark Klein, MD;  Location: [place];  Service: General;  Laterality: Right;   CARDIOVERSION N/A 05/10/2020   Procedure: CARDIOVERSION;  Surgeon: Freada Bergeron, MD;  Location: [place];  Service: Cardiovascular;  Laterality: N/A;   CARDIOVERSION N/A 05/08/2021   Procedure: CARDIOVERSION;  Surgeon: Donato Heinz, MD;  Location: [place];  Service: Cardiovascular;  Laterality: N/A;   CARDIOVERSION N/A 02/17/2022   Procedure: CARDIOVERSION;  Surgeon: Jerline Pain, MD;  [facility] ENDOSCOPY;  Service: Cardiovascular;  Laterality: N/A;   COLONOSCOPY  2002   CORONARY ANGIOGRAPHY N/A 02/13/2022   Procedure: CORONARY ANGIOGRAPHY;  Surgeon: Sherren Mocha, MD;  Location: [place] [facility];  Service: Cardiovascular;  Laterality: N/A;   FASCIECTOMY Left 03/28/2019   Procedure: SEGMENTAL FASCIECTOMY LEFT RING FINGER;  Surgeon: Daryll Brod, MD;  Location: [place];  Service: Orthopedics;  Laterality: Left;  ANESTHESIA  AXILLARY BLOCK   INSERTION PROSTATE RADIATION SEED  8 4 2010   per Dr Gwenevere Ghazi ULTRASOUND/IVUS N/A 02/13/2022   Procedure: Intravascular Ultrasound/IVUS;  Surgeon: Sherren Mocha, MD;  Location: [place] [facility];  Service: Cardiovascular;  Laterality: N/A;   MELANOMA EXCISION Right 06/22/2019   Procedure: WIDE LOCAL EXCISION RIGHT CHEST Vicy Medico MELANOMA, ADVANCEMENT FLAP CLOSURE FOR DEFECT 3X6 CM;  Surgeon: Stark Klein, MD;  Location: [place];  Service: General;  Laterality: Right;   POLYPECTOMY  2002   RIGHT/LEFT HEART CATH AND CORONARY ANGIOGRAPHY N/A 02/11/2022   Procedure: RIGHT/LEFT HEART CATH AND CORONARY ANGIOGRAPHY;  Surgeon: Nelva Bush, MD;  Location: [place] [facility];  Service: Cardiovascular;  Laterality: N/A;   TEE WITHOUT CARDIOVERSION N/A 02/17/2022   Procedure: TRANSESOPHAGEAL ECHOCARDIOGRAM (TEE);  Surgeon: Jerline Pain, MD;   Location: [facility] ENDOSCOPY;  Service: Cardiovascular;  Laterality: N/A;   THROAT SURGERY  1980   benign cyst   UPPER GASTROINTESTINAL ENDOSCOPY     URETHRAL STRICTURE DILATATION     also penile implant   Family History  Problem Relation Age of Onset   Coronary artery disease Brother        3 brothers had CABG   Arrhythmia Brother    Diabetes Brother    Hearing loss Brother    Hypertension Brother    Heart disease Brother    Heart failure Brother    Hypertension Brother    Heart disease Father    Hypertension Father    Sudden death Father    Stroke Mother        cerebral hemorrage   Colon cancer Mother 78       small intestine   Depression Mother    Stroke Sister    Suicidality Sister    Diabetes Brother    Hypertension Brother    Hyperlipidemia Sister    Esophageal cancer Neg Hx    Pancreatic cancer Neg Hx    Prostate cancer Neg Hx    Rectal cancer Neg Hx    Stomach cancer Neg Hx    Social History  Tobacco Use   Smoking status: Never   Smokeless tobacco: Never  Substance Use Topics   Alcohol use: Not Currently    Comment: social   No Known Allergies Prior to Admission medications   Medication Sig Start Date End Date Taking? Authorizing Provider  acetaminophen (TYLENOL) 325 MG tablet Take 2 tablets (650 mg total) by mouth every 6 (six) hours as needed for mild pain (or Fever >/= 101). 02/11/22  Yes Emeterio Reeve, DO  acetaminophen (TYLENOL) 650 MG suppository Place 1 suppository (650 mg total) rectally every 6 (six) hours as needed for mild pain (or Fever >/= 101). 02/11/22  Yes Emeterio Reeve, DO  amiodarone (PACERONE) 200 MG tablet Take 1 tablet (200 mg total) by mouth daily. 02/25/22  Yes Margie Billet, PA-C  aspirin EC 81 MG tablet Take 1 tablet (81 mg total) by mouth daily. Swallow whole. 02/25/22  Yes Margie Billet, PA-C  atorvastatin (LIPITOR) 80 MG tablet Take 1 tablet (80 mg total) by mouth daily. 02/25/22  Yes Margie Billet, PA-C   dabigatran (PRADAXA) 150 MG CAPS capsule Take 150 mg by mouth 2 (two) times daily.   Yes [provider]  empagliflozin (JARDIANCE) 10 MG TABS tablet Take 1 tablet (10 mg total) by mouth daily before breakfast. 03/19/22  Yes Swinyer, Lanice Schwab, NP  finasteride (PROSCAR) 5 MG tablet Take 5 mg by mouth daily. 08/22/21  Yes [provider]  furosemide (LASIX) 40 MG tablet Take 1 tablet (40 mg total) by mouth daily. 02/25/22  Yes Margie Billet, PA-C  linagliptin (TRADJENTA) 5 MG TABS tablet Take 1 tablet (5 mg total) by mouth daily. 10/06/21  Yes Susy Frizzle, MD  LORazepam (ATIVAN) 0.5 MG tablet Take 1 tablet (0.5 mg total) by mouth 2 (two) times daily as needed for anxiety. 01/20/22  Yes Susy Frizzle, MD  metoprolol succinate (TOPROL-XL) 50 MG 24 hr tablet Take 1 tablet (50 mg total) by mouth daily. Take with or immediately following a meal. 02/25/22  Yes Margie Billet, PA-C  spironolactone (ALDACTONE) 25 MG tablet Take 1/2 tablets (12.5 mg total) by mouth daily. 02/25/22  Yes Margie Billet, PA-C  budesonide-formoterol (SYMBICORT) 160-4.5 MCG/ACT inhaler Inhale 2 puffs into the lungs 2 (two) times daily. Patient not taking: Reported on 02/27/2022 01/20/22   Susy Frizzle, MD  cetirizine (ZYRTEC) 10 MG tablet TAKE 1 TABLET BY MOUTH EVERY DAY. NOT COVERED BY INSURANCE. Patient not taking: Reported on 04/08/2022 12/16/21   Susy Frizzle, MD  diclofenac Sodium (VOLTAREN) 1 % GEL Apply 2 g topically 4 (four) times daily. Patient not taking: Reported on 03/19/2022 12/29/21   Susy Frizzle, MD  fluticasone [facility]) 50 MCG/ACT nasal spray Place 1 spray into both nostrils daily as needed for allergies or rhinitis. Patient not taking: Reported on 04/08/2022    [provider]  mupirocin ointment (BACTROBAN) 2 % Apply 1 Application topically 2 (two) times daily. Patient not taking: Reported on 04/08/2022 02/03/22   Lavonna Monarch, MD  nitroGLYCERIN  (NITROSTAT) 0.4 MG SL tablet Place 1 tablet (0.4 mg total) under the tongue every 5 (five) minutes x 3 doses as needed for chest pain. Patient not taking: Reported on 04/08/2022 02/24/22   Margie Billet, PA-C  ondansetron (ZOFRAN) 4 MG tablet Take 1 tablet (4 mg total) by mouth every 6 (six) hours as needed for nausea. Patient not taking: Reported on 04/08/2022 03/19/22   Swinyer, Lanice Schwab, NP  traZODone (DESYREL) 50  MG tablet Take 0.5 tablets (25 mg total) by mouth at bedtime as needed for sleep. Patient not taking: Reported on 04/08/2022 03/19/22   Swinyer, Lanice Schwab, NP    Review of Systems  Constitutional:  Positive for fatigue (easily). Negative for appetite change.  HENT:  Negative for congestion, postnasal drip and sore throat.   Eyes: Negative.   Respiratory:  Negative for cough, shortness of breath and wheezing.   Cardiovascular:  Negative for chest pain, palpitations and leg swelling (little bit).  Gastrointestinal:  Negative for abdominal distention and abdominal pain.  Endocrine: Negative.   Genitourinary: Negative.   Musculoskeletal:  Negative for back pain and neck pain.  Skin: Negative.   Allergic/Immunologic: Negative.   Neurological:  Negative for dizziness and light-headedness.  Hematological:  Negative for adenopathy. Does not bruise/bleed easily.  Psychiatric/Behavioral:  Negative for dysphoric mood and sleep disturbance (sleeping on 1 pillow). The patient is not nervous/anxious.    Vitals:   04/08/22 1240  BP: (!) 124/56  Pulse: 74  Resp: 16  SpO2: 99%    Wt Readings from Last 3 Encounters:  04/08/22 168 lb (76.2 kg)  03/19/22 164 lb 3.2 oz (74.5 kg)  02/27/22 169 lb (76.7 kg)    Lab Results  Component Value Date   CREATININE 1.92 (H) 03/19/2022   CREATININE 1.93 (H) 03/03/2022   CREATININE 1.52 (H) 02/24/2022   Physical Exam Vitals and nursing note reviewed. Exam conducted with a chaperone present (caregiver).  Constitutional:      Appearance:  Normal appearance.  HENT:     Head: Normocephalic and atraumatic.  Cardiovascular:     Rate and Rhythm: Normal rate and regular rhythm.  Pulmonary:     Effort: Pulmonary effort is normal. No respiratory distress.     Breath sounds: No wheezing or rales.  Abdominal:     General: Abdomen is flat. There is no distension.     Palpations: Abdomen is soft.  Musculoskeletal:        General: No tenderness.     Cervical back: Normal range of motion and neck supple.     Right lower leg: No edema (trace pitting).     Left lower leg: No edema.  Skin:    General: Skin is warm and dry.  Neurological:     General: No focal deficit present.     Mental Status: He is alert and oriented to person, place, and time.  Psychiatric:        Mood and Affect: Mood normal.        Behavior: Behavior normal.        Thought Content: Thought content normal.    Assessment & Plan:  1: Chronic heart failure with reduced ejection fraction- - NYHA class III - euvolemic today - weighing daily; reminded to call for an overnight weight gain of > 2 pounds or a weekly weight gain of > 5 pounds - weight 164 today down 5 lbs since last visit on 02/27/22 - on GDMT of jardiance, metoprolol succinate & spironolactone - losartan had been held due to labile renal function & elevated potassium - BNP 02/08/22 was 1751.8  2: HTN- - BP looks good (124/56) - saw PCP ([facility]) 01/20/22 - BMP 03/19/22 reviewed and showed sodium 141, potassium 5.0, creatinine 1.92 and GFR 34  3: DM- - A1c 02/09/22 was 6.4%  4: Atrial fibrillation- - taking amiodarone and metoprolol   5: CAD- - saw cardiology Marlou Porch) 12/16/21  6: Sleep apnea- - not using CPAP  Medication list reviewed with patient.   Patient will follow-up as needed as he will be going [facility] [place].

## 2022-04-09 ENCOUNTER — Encounter: Payer: Self-pay | Admitting: *Deleted

## 2022-04-13 ENCOUNTER — Other Ambulatory Visit: Payer: Self-pay | Admitting: Cardiology

## 2022-04-13 ENCOUNTER — Other Ambulatory Visit: Payer: Self-pay | Admitting: Nurse Practitioner

## 2022-04-13 ENCOUNTER — Other Ambulatory Visit: Payer: Self-pay | Admitting: Family Medicine

## 2022-04-13 DIAGNOSIS — I480 Paroxysmal atrial fibrillation: Secondary | ICD-10-CM

## 2022-04-15 NOTE — Telephone Encounter (Signed)
Prescription refill request for Pradaxa received.  Indication: Afib  Last office visit: 04/08/22 Jackelyn Hoehn)  Weight: 76.2kg Age: 86 Scr: 1.92 (03/19/22)  CrCl: 30.50m/min  Appropriate dose and refill sent to requested pharmacy.

## 2022-04-15 NOTE — Telephone Encounter (Signed)
Pt's pharmacy is requesting a refill on trazodone. Would Dr. Marlou Porch like to refill this medication? Please address

## 2022-04-18 ENCOUNTER — Encounter (HOSPITAL_COMMUNITY): Payer: Self-pay

## 2022-04-18 ENCOUNTER — Emergency Department (HOSPITAL_COMMUNITY)
Admission: EM | Admit: 2022-04-18 | Discharge: 2022-04-18 | Disposition: A | Discharge status: Home / Self Care | Payer: Medicare Other | Attending: Emergency Medicine | Admitting: Emergency Medicine

## 2022-04-18 ENCOUNTER — Emergency Department (HOSPITAL_COMMUNITY): Payer: Medicare Other

## 2022-04-18 ENCOUNTER — Other Ambulatory Visit: Payer: Self-pay

## 2022-04-18 DIAGNOSIS — I509 Heart failure, unspecified: Secondary | ICD-10-CM | POA: Diagnosis not present

## 2022-04-18 DIAGNOSIS — Z79899 Other long term (current) drug therapy: Secondary | ICD-10-CM | POA: Insufficient documentation

## 2022-04-18 DIAGNOSIS — R040 Epistaxis: Secondary | ICD-10-CM | POA: Diagnosis not present

## 2022-04-18 DIAGNOSIS — U071 COVID-19: Secondary | ICD-10-CM | POA: Diagnosis not present

## 2022-04-18 DIAGNOSIS — R791 Abnormal coagulation profile: Secondary | ICD-10-CM | POA: Insufficient documentation

## 2022-04-18 DIAGNOSIS — I480 Paroxysmal atrial fibrillation: Secondary | ICD-10-CM | POA: Insufficient documentation

## 2022-04-18 DIAGNOSIS — R531 Weakness: Secondary | ICD-10-CM | POA: Diagnosis present

## 2022-04-18 DIAGNOSIS — I110 Hypertensive heart disease with heart failure: Secondary | ICD-10-CM | POA: Insufficient documentation

## 2022-04-18 DIAGNOSIS — Z7982 Long term (current) use of aspirin: Secondary | ICD-10-CM | POA: Insufficient documentation

## 2022-04-18 DIAGNOSIS — Z7901 Long term (current) use of anticoagulants: Secondary | ICD-10-CM | POA: Diagnosis not present

## 2022-04-18 LAB — URINALYSIS, ROUTINE W REFLEX MICROSCOPIC
Bacteria, UA: NONE SEEN
Bilirubin Urine: NEGATIVE
Glucose, UA: >=500 mg/dL — AB
Hgb urine dipstick: NEGATIVE
Ketones, ur: NEGATIVE mg/dL
Leukocytes,Ua: NEGATIVE
Nitrite: NEGATIVE
Protein, ur: NEGATIVE mg/dL
Specific Gravity, Urine: 1.017 (ref 1.005–1.030)
pH: 5 (ref 5.0–8.0)

## 2022-04-18 LAB — BASIC METABOLIC PANEL
Anion gap: 15 (ref 5–15)
BUN: 44 mg/dL — ABNORMAL HIGH (ref 8–23)
CO2: 19 mmol/L — ABNORMAL LOW (ref 22–32)
Calcium: 8.9 mg/dL (ref 8.9–10.3)
Chloride: 101 mmol/L (ref 98–111)
Creatinine, Ser: 2.08 mg/dL — ABNORMAL HIGH (ref 0.61–1.24)
GFR, Estimated: 31 mL/min — ABNORMAL LOW (ref >60)
Glucose, Bld: 186 mg/dL — ABNORMAL HIGH (ref 70–99)
Potassium: 4.5 mmol/L (ref 3.5–5.1)
Sodium: 135 mmol/L (ref 135–145)

## 2022-04-18 LAB — CBC
HCT: 39 % (ref 39.0–52.0)
Hemoglobin: 13.1 g/dL (ref 13.0–17.0)
MCH: 30.1 pg (ref 26.0–34.0)
MCHC: 33.6 g/dL (ref 30.0–36.0)
MCV: 89.7 fL (ref 80.0–100.0)
Platelets: 127 10*3/uL — ABNORMAL LOW (ref 150–400)
RBC: 4.35 MIL/uL (ref 4.22–5.81)
RDW: 16.6 % — ABNORMAL HIGH (ref 11.5–15.5)
WBC: 4.4 10*3/uL (ref 4.0–10.5)
nRBC: 0 % (ref 0.0–0.2)

## 2022-04-18 LAB — CBG MONITORING, ED: Glucose-Capillary: 177 mg/dL — ABNORMAL HIGH (ref 70–99)

## 2022-04-18 LAB — RESP PANEL BY RT-PCR (FLU A&B, COVID) ARPGX2
Influenza A by PCR: NEGATIVE
Influenza B by PCR: NEGATIVE
SARS Coronavirus 2 by RT PCR: POSITIVE — AB

## 2022-04-18 LAB — TROPONIN I (HIGH SENSITIVITY)
Troponin I (High Sensitivity): 19 ng/L — ABNORMAL HIGH (ref <18)
Troponin I (High Sensitivity): 19 ng/L — ABNORMAL HIGH (ref <18)

## 2022-04-18 LAB — PROTIME-INR
INR: 6.8 (ref 0.8–1.2)
Prothrombin Time: 58.8 seconds — ABNORMAL HIGH (ref 11.4–15.2)

## 2022-04-18 LAB — BRAIN NATRIURETIC PEPTIDE: B Natriuretic Peptide: 138.7 pg/mL — ABNORMAL HIGH (ref 0.0–100.0)

## 2022-04-18 MED ORDER — OXYMETAZOLINE HCL 0.05 % NA SOLN: 1.0000 | Freq: Once | NASAL | Status: DC

## 2022-04-18 MED ORDER — APIXABAN 2.5 MG PO TABS: 2.5000 mg | ORAL_TABLET | Freq: Two times a day (BID) | ORAL | 0 refills | Status: DC

## 2022-04-18 MED ORDER — OXYMETAZOLINE HCL 0.05 % NA SOLN: 1.0000 | Freq: Two times a day (BID) | NASAL | 0 refills | Status: DC

## 2022-04-18 MED ORDER — BACITRACIN ZINC 500 UNIT/GM EX OINT: 1.0000 | TOPICAL_OINTMENT | Freq: Two times a day (BID) | CUTANEOUS | 0 refills | Status: DC

## 2022-04-18 NOTE — ED Triage Notes (Signed)
Patient complains of nosebleed from left nare intermittently since 0100. Has had hx of same. Patient also complains of weakness the past several days and thinks related to his a. Fib, denies pain. No bleeding on arrival

## 2022-04-18 NOTE — ED Provider Notes (Signed)
[facility] EMERGENCY DEPARTMENT Provider Note   CSN: [id number] Arrival date & time: 04/18/22  0735     History PMH: HLD, HTN, Atrial Fibrillation, CHF (EF 30% in 7/23), DM2, CAD -Recent admission in July 2023 where he was seen for A fib RVR, new onset heart failure with EF of 25-30%. He underwent heart catheterization on 02/11/22 which demonstrated severe ostial/proximal left main disease without intervention. He was started on Amiodarone. He has been on Pradaxa for A fib  No chief complaint on file.   Antonio Clark is a 86 y.o. male.  Patient is presenting to the ED with nosebleed and generalized weakness.  He states over the past week he has felt progressively more more weak in his lower extremities to the point where he is having trouble ambulating.  He has noticed some mild shortness of breath.  He states that last night he started having a nosebleed that he could not get to stop at home which was the ultimate reason he called EMS.  He states he has had these nosebleeds in the past, however this is the first time he could not get it to stop.  He says that the bleed stopped just shortly before he was roomed.  He reports recently being diagnosed with CHF back in July and started several new medications at that time, but has not had any change in medication since then.  He has not had any recent weight changes over the past few days.  Does report a new cough that is started with some productive sputum colored phlegm.  He denies any sore throat, congestion, headaches, chest pain, abdominal pain, nausea, vomiting, numbness, speech changes, dizziness, fevers, urinary symptoms, leg swelling.  HPI     Home Medications Prior to Admission medications   Medication Sig Start Date End Date Taking? Authorizing Provider  apixaban (ELIQUIS) 2.5 MG TABS tablet Take 1 tablet (2.5 mg total) by mouth 2 (two) times daily for 14 days. 04/18/22 05/02/22 Yes Sacha Radloff, Adora Fridge, PA-C   bacitracin ointment Apply 1 Application topically 2 (two) times daily. 04/18/22  Yes Dakotah Orrego, Adora Fridge, PA-C  LORazepam (ATIVAN) 0.5 MG tablet TAKE 1 TABLET BY MOUTH 2 TIMES DAILY AS NEEDED FOR ANXIETY. 04/14/22   Susy Frizzle, MD  oxymetazoline (AFRIN NASAL SPRAY) 0.05 % nasal spray Place 1 spray into both nostrils 2 (two) times daily. 04/18/22  Yes Roarke Marciano, Adora Fridge, PA-C  acetaminophen (TYLENOL) 325 MG tablet Take 2 tablets (650 mg total) by mouth every 6 (six) hours as needed for mild pain (or Fever >/= 101). 02/11/22   Emeterio Reeve, DO  acetaminophen (TYLENOL) 650 MG suppository Place 1 suppository (650 mg total) rectally every 6 (six) hours as needed for mild pain (or Fever >/= 101). 02/11/22   Emeterio Reeve, DO  amiodarone (PACERONE) 200 MG tablet Take 1 tablet (200 mg total) by mouth daily. 02/25/22   Margie Billet, PA-C  aspirin EC 81 MG tablet Take 1 tablet (81 mg total) by mouth daily. Swallow whole. 02/25/22   Margie Billet, PA-C  atorvastatin (LIPITOR) 80 MG tablet Take 1 tablet (80 mg total) by mouth daily. 02/25/22   Margie Billet, PA-C  budesonide-formoterol (SYMBICORT) 160-4.5 MCG/ACT inhaler Inhale 2 puffs into the lungs 2 (two) times daily. Patient not taking: Reported on 02/27/2022 01/20/22   Susy Frizzle, MD  cetirizine (ZYRTEC) 10 MG tablet TAKE 1 TABLET BY MOUTH EVERY DAY. NOT COVERED BY INSURANCE. Patient not taking: Reported  on 04/08/2022 12/16/21   Susy Frizzle, MD  diclofenac Sodium (VOLTAREN) 1 % GEL Apply 2 g topically 4 (four) times daily. Patient not taking: Reported on 03/19/2022 12/29/21   Susy Frizzle, MD  empagliflozin (JARDIANCE) 10 MG TABS tablet Take 1 tablet (10 mg total) by mouth daily before breakfast. 03/19/22   Swinyer, Lanice Schwab, NP  finasteride (PROSCAR) 5 MG tablet Take 5 mg by mouth daily. 08/22/21   [provider]  fluticasone (FLONASE) 50 MCG/ACT nasal spray Place 1 spray into both nostrils daily as needed for  allergies or rhinitis. Patient not taking: Reported on 04/08/2022    [provider]  furosemide (LASIX) 40 MG tablet Take 1 tablet (40 mg total) by mouth daily. 02/25/22   Margie Billet, PA-C  linagliptin (TRADJENTA) 5 MG TABS tablet Take 1 tablet (5 mg total) by mouth daily. 10/06/21   Susy Frizzle, MD  metoprolol succinate (TOPROL-XL) 50 MG 24 hr tablet Take 1 tablet (50 mg total) by mouth daily. Take with or immediately following a meal. 02/25/22   Margie Billet, PA-C  mupirocin ointment (BACTROBAN) 2 % Apply 1 Application topically 2 (two) times daily. Patient not taking: Reported on 04/08/2022 02/03/22   Lavonna Monarch, MD  nitroGLYCERIN (NITROSTAT) 0.4 MG SL tablet Place 1 tablet (0.4 mg total) under the tongue every 5 (five) minutes x 3 doses as needed for chest pain. Patient not taking: Reported on 04/08/2022 02/24/22   Margie Billet, PA-C  ondansetron (ZOFRAN) 4 MG tablet Take 1 tablet (4 mg total) by mouth every 6 (six) hours as needed for nausea. Patient not taking: Reported on 04/08/2022 03/19/22   Swinyer, Lanice Schwab, NP  spironolactone (ALDACTONE) 25 MG tablet Take 1/2 tablets (12.5 mg total) by mouth daily. 02/25/22   Margie Billet, PA-C  traZODone (DESYREL) 50 MG tablet Take 0.5 tablets (25 mg total) by mouth at bedtime as needed for sleep. Patient not taking: Reported on 04/08/2022 03/19/22   Swinyer, Lanice Schwab, NP      Allergies    Patient has no known allergies.    Review of Systems   Review of Systems  HENT:  Positive for nosebleeds.   Respiratory:  Positive for cough and shortness of breath.   Neurological:  Positive for weakness.  All other systems reviewed and are negative.   Physical Exam Updated Vital Signs BP 114/74   Pulse 67   Temp 98 F (36.7 C)   Resp (!) 22   SpO2 98%  Physical Exam Vitals and nursing note reviewed.  Constitutional:      General: He is not in acute distress.    Appearance: Normal appearance. He is  well-developed. He is not ill-appearing, toxic-appearing or diaphoretic.  HENT:     Head: Normocephalic and atraumatic.     Nose: Nose normal. No nasal deformity.     Comments: There is no nasal bleeding present    Mouth/Throat:     Lips: Pink. No lesions.     Mouth: Mucous membranes are moist.     Pharynx: Oropharynx is clear. No oropharyngeal exudate or posterior oropharyngeal erythema.  Eyes:     General: Gaze aligned appropriately. No scleral icterus.       Right eye: No discharge.        Left eye: No discharge.     Conjunctiva/sclera: Conjunctivae normal.     Right eye: Right conjunctiva is not injected. No exudate or hemorrhage.    Left eye:  Left conjunctiva is not injected. No exudate or hemorrhage.    Pupils: Pupils are equal, round, and reactive to light.  Neck:     Comments: No JVD Cardiovascular:     Rate and Rhythm: Normal rate and regular rhythm.     Pulses: Normal pulses.          Radial pulses are 2+ on the right side and 2+ on the left side.       Dorsalis pedis pulses are 2+ on the right side and 2+ on the left side.     Heart sounds: Normal heart sounds. No murmur heard.    No friction rub. No gallop. No S3 or S4 sounds.  Pulmonary:     Effort: Pulmonary effort is normal. No respiratory distress.     Breath sounds: Normal breath sounds. No stridor. No wheezing, rhonchi or rales.  Abdominal:     General: Abdomen is flat. There is no distension.     Palpations: Abdomen is soft.     Tenderness: There is no abdominal tenderness. There is no guarding or rebound.  Musculoskeletal:     Right lower leg: No edema.     Left lower leg: No edema.  Skin:    General: Skin is warm and dry.  Neurological:     General: No focal deficit present.     Mental Status: He is alert and oriented to person, place, and time.     Comments: Unable to appreciate any motor weakness of the upper or lower extremities.  He has no ataxia noted  Psychiatric:        Mood and Affect: Mood  normal.        Speech: Speech normal.        Behavior: Behavior normal. Behavior is cooperative.     ED Results / Procedures / Treatments   Labs (all labs ordered are listed, but only abnormal results are displayed) Labs Reviewed  RESP PANEL BY RT-PCR (FLU A&B, COVID) ARPGX2 - Abnormal; Notable for the following components:      Result Value   SARS Coronavirus 2 by RT PCR POSITIVE (*)    All other components within normal limits  BASIC METABOLIC PANEL - Abnormal; Notable for the following components:   CO2 19 (*)    Glucose, Bld 186 (*)    BUN 44 (*)    Creatinine, Ser 2.08 (*)    GFR, Estimated 31 (*)    All other components within normal limits  CBC - Abnormal; Notable for the following components:   RDW 16.6 (*)    Platelets 127 (*)    All other components within normal limits  PROTIME-INR - Abnormal; Notable for the following components:   Prothrombin Time 58.8 (*)    INR 6.8 (*)    All other components within normal limits  URINALYSIS, ROUTINE W REFLEX MICROSCOPIC - Abnormal; Notable for the following components:   Glucose, UA >=500 (*)    All other components within normal limits  BRAIN NATRIURETIC PEPTIDE - Abnormal; Notable for the following components:   B Natriuretic Peptide 138.7 (*)    All other components within normal limits  CBG MONITORING, ED - Abnormal; Notable for the following components:   Glucose-Capillary 177 (*)    All other components within normal limits  TROPONIN I (HIGH SENSITIVITY) - Abnormal; Notable for the following components:   Troponin I (High Sensitivity) 19 (*)    All other components within normal limits  TROPONIN I (HIGH SENSITIVITY) -  Abnormal; Notable for the following components:   Troponin I (High Sensitivity) 19 (*)    All other components within normal limits    EKG EKG Interpretation  Date/Time:  Saturday April 18 2022 07:41:35 EDT Ventricular Rate:  85 PR Interval:  178 QRS Duration: 92 QT Interval:  394 QTC  Calculation: 468 R Axis:   -24 Text Interpretation: Normal sinus rhythm Minimal voltage criteria for LVH, may be normal variant ( R in aVL ) Nonspecific ST abnormality Abnormal ECG When compared with ECG of 17-Feb-2022 08:55, PREVIOUS ECG IS PRESENT No acute changes No old tracing to compare Confirmed by Varney Biles [phone]) on 04/18/2022 12:37:44 PM  Radiology DG Chest 2 View  Result Date: 04/18/2022 CLINICAL DATA:  Shortness of breath/CHF. EXAM: CHEST - 2 VIEW COMPARISON:  02/16/2022 and CT 02/08/2022 FINDINGS: Lungs are adequately inflated with subtle linear density over the right midlung likely atelectasis although early infection is possible. No effusion. Cardiomediastinal silhouette is normal. Stable compression fracture over the region of the upper lumbar spine. IMPRESSION: Subtle linear density over the right midlung likely atelectasis, although early infection is possible. Electronically Signed   By: Marin Olp M.D.   On: 04/18/2022 09:04    Procedures Procedures  This patient was on telemetry or cardiac monitoring during their time in the ED.    Medications Ordered in ED Medications  oxymetazoline (AFRIN) 0.05 % nasal spray 1 spray (1 spray Each Nare Not Given 04/18/22 1459)    ED Course/ Medical Decision Making/ A&P Clinical Course as of 04/18/22 1550  Sat Apr 18, 2022  1338 Patient with COVID 19. His INR is 6.8. Per pharmacy, he was started on Amiodarone during his last admission, however this interacts with is Pravaxa which is likely driving his INR up. He continues to have intermittent nose bleeds likely caused by  his coagulopathy. He is also on not on renal dosing of Pradaxa. Plan to consult cards. [GL]  [place] which is a short term solution, long term solution.  Start Eliquis today. Cards will reach out to his cardiologist. Follow Cardiology. Chandrasekar [GL]    Clinical Course User Index [GL] Sherre Poot Adora Fridge, PA-C                           Medical  Decision Making Amount and/or Complexity of Data Reviewed Labs: ordered. Radiology: ordered.  Risk OTC drugs. Prescription drug management.    MDM  This is a 86 y.o. male who presents to the ED with generalized weakness for one week and epistaxis that began last night The differential of this patient includes but is not limited to ACS, CHF, anemia, impaired renal function, electrolyte abnormality, UTI, pneumonia, viral illness, hypoglycemia, hyperglycemia, arrhythmia  Initial Impression  Chronically ill appearing. No acute distress BP okay. Afebrile. HDS. Oxygenating at 100% on RA Nose bleed has stopped prior to me seeing patient. Will continue to monitor. EKG shows that patient is NOT in a fib. Basic Labs ordered by triage Adding on troponins, BNP, Respiratory panel, UA, and CXR to evaluate source of weakness.   I personally ordered, reviewed, and interpreted all laboratory work and imaging and agree with radiologist interpretation. Results interpreted below:  No leukocytosis or anemia present.  Platelets are 127 which is stable from his normal.  CO2 is 19 which is chronic.'s creatinine is 2.08 with BUN of 44 which is stable from his baseline.  Glucose is 186.  BNP 138.  INR 6.8.  Troponin is 19x2.  Urinalysis without signs of infection.  COVID-19 positive.  Chest x-ray without any signs of pulmonary edema or fluid.  EKG is normal sinus rhythm and unchanged from baseline.  Assessment/Plan:  Patient presenting with 1 week of generalized weakness as well as recurrent epistasis since last night.  He has stable vitals here.  He has a nonfocal neurological exam as well as normal 5 out of 5 strength in all extremities so lower suspicion for neurological etiology.  He is euvolemic on exam so less likely to be heart failure exacerbation.  Labs are notable for a positive COVID-19 test which is likely contributing to patient's weakness.  He is also found to have an INR 6.8.  After talking to  pharmacy, it was made aware to me that patient was started on amiodarone during his last admission, however this has interaction with Provaxa.  His INR has been grossly elevated ever since starting amiodarone.  This likely was causing his epistasis.  I have consulted cardiology and spoken with Dr. Julieanne Manson.  He recommends discontinuing his Pradaxa and starting on the renal dosing Eliquis therapy.  He will put in a note to schedule the patient for appointment with cardiology next week.  I discussed these findings with the patient and they understand the plan to stop Pradaxa and start Eliquis.  Unfortunately, patient is not a candidate for Paxlovid therapy.  Provided supportive treatment options at home and recommended that they return to the emergency department if he develops worsening weakness, breathing problems, or worsening bleeding problems.  I considered admission, however feel that patient can manage his symptoms at home.  Epistaxis successfully managed with Afrin   Charting Requirements Additional history is obtained from:  Independent historian External Records from outside source obtained and reviewed including: Recent admission note, recent cardiology note, prior INR records Social Determinants of Health:  none Pertinant PMH that complicates patient's illness: Atrial fibrillation  Patient Care Problems that were addressed during this visit: - COVID 19: Acute illness with systemic symptoms - Epistaxis: Acute illness with complication - Elevated INR: Acute illness with systemic symptoms This patient was maintained on a cardiac monitor/telemetry. I personally viewed and interpreted the cardiac monitor which reveals an underlying rhythm of NSR Medications given in ED: Afrin Reevaluation of the patient after these medicines showed that the patient improved I have reviewed home medications and made changes accordingly.  Critical Care Interventions: n/a Consultations:  Cardiology Disposition: discharge  This is a shared visit with my attending physician, Dr. Kathrynn Humble.  We have discussed this patient and they have independently evaluated this patient. The plan was altered or changed as needed.  Portions of this note were generated with [employer]. Dictation errors may occur despite best attempts at proofreading.      Final Clinical Impression(s) / ED Diagnoses Final diagnoses:  COVID-19  Epistaxis  Elevated INR  Paroxysmal atrial fibrillation with rapid ventricular response ([place])    Rx / DC Orders ED Discharge Orders          Ordered    oxymetazoline (AFRIN NASAL SPRAY) 0.05 % nasal spray  2 times daily        04/18/22 1444    apixaban (ELIQUIS) 2.5 MG TABS tablet  2 times daily        04/18/22 1445    bacitracin ointment  2 times daily        04/18/22 1446    Ambulatory referral to Cardiology  Comments: If you have not heard from the Cardiology office within the next 72 hours please call [phone].   04/18/22 1451              Adolphus Birchwood, PA-C 04/18/22 [place], [place], MD 04/19/22 [phone]

## 2022-04-18 NOTE — Discharge Instructions (Addendum)
Stop taking her Pradaxa.  Please pick up your Eliquis prescription from the pharmacy and start taking tomorrow. I have also prescribed you Afrin nasal spray that you can use for your nosebleeds.  You could also put bacitracin into the affected nostril following afrin administration to prevent dryness and increased bleeding. Please return if you experience worsening nosebleeds.  I spoke with Dr. Gasper Sells here who has contacted Dr. Marlou Porch and requested that they schedule a follow up visit with you for next week. Please call the office if you do not hear from them.

## 2022-04-20 ENCOUNTER — Telehealth: Payer: Self-pay | Admitting: Cardiology

## 2022-04-20 NOTE — Telephone Encounter (Signed)
Patient was in the hospital for nose bleed. They changed patients medication from Predaxa to Eliquis. Wants to make sure they are on the right track

## 2022-04-20 NOTE — Progress Notes (Deleted)
Cardiology Office Note:    Date:  04/20/2022   ID:  Antonio Clark, DOB 12/17/1935, MRN [id number]  PCP:  Susy Frizzle, MD  [facility] Cardiologist:  Candee Furbish, MD Electrophysiologist:  Vickie Epley, MD { Click to update primary MD,subspecialty MD or APP then REFRESH:1}  *** Referring MD: Susy Frizzle, MD   Chief Complaint:  No chief complaint on file. {Click here for Visit Info    :1}    History of Present Illness:   Antonio Clark is a 86 y.o. male with         Past Medical History:  Diagnosis Date   Anal fissure    Arthritis    Atrial fibrillation ([place])    Back pain    CHF (congestive heart failure) ([place])    Colon polyps    Coronary artery disease    Diabetes mellitus type 2 with complications ([place])    Dysrhythmia    a-fib   GERD (gastroesophageal reflux disease)    History of echocardiogram 07/ 07/ 2011   History of lithotripsy 1989   Hyperlipidemia    Hypertension    Kidney stones    Melanoma ([place]) 05/01/2019   right chest wall (12/20)   Prostate CA ([place]) 2010   Sleep apnea    uses CPAP nightly   Squamous cell carcinoma of skin 05/23/1992   bowens-left parietal scalp (CX35FU)   Squamous cell carcinoma of skin 03/14/2008   in situ-left upper outer forehead-medial (CX35FU)   Squamous cell carcinoma of skin 03/14/2008   in situ-crown of scalp (Cx35FU)   Squamous cell carcinoma of skin 04/15/2011   in situ-right dorsal forearm (txpbx)   Squamous cell carcinoma of skin 10/16/2011   in situ-left sideburn   Squamous cell carcinoma of skin 03/11/2015   ka-left sideburn (CX35FU)   Squamous cell carcinoma of skin 03/11/2015   ka-left forearm (CX35FU)   Squamous cell carcinoma of skin 06/22/2018   in situ-left forearm, sup (txpbx)   Squamous cell carcinoma of skin 05/01/2019   in situ-mid anterior scalp    Squamous cell carcinoma of skin 05/01/2019   in situ-right upper arm   Current Medications: No outpatient medications  have been marked as taking for the 04/21/22 encounter (Appointment) with Imogene Burn, PA-C.    Allergies:   Patient has no known allergies.   Social History   Tobacco Use   Smoking status: Never   Smokeless tobacco: Never  Vaping Use   Vaping Use: Never used  Substance Use Topics   Alcohol use: Not Currently    Comment: social   Drug use: No    Family Hx: The patient's family history includes Arrhythmia in his brother; Colon cancer (age of onset: 39) in his mother; Coronary artery disease in his brother; Depression in his mother; Diabetes in his brother and brother; Hearing loss in his brother; Heart disease in his brother and father; Heart failure in his brother; Hyperlipidemia in his sister; Hypertension in his brother, brother, brother, and father; Stroke in his mother and sister; Sudden death in his father; Suicidality in his sister. There is no history of Esophageal cancer, Pancreatic cancer, Prostate cancer, Rectal cancer, or Stomach cancer.  ROS   EKGs/Labs/Other Test Reviewed:    EKG:  EKG is *** ordered today.  The ekg ordered today demonstrates ***  Recent Labs: 02/09/2022: TSH 3.207 02/10/2022: Magnesium 1.6 03/03/2022: ALT 64 04/18/2022: B Natriuretic Peptide 138.7; BUN 44; Creatinine, Ser 2.08; Hemoglobin  13.1; Platelets 127; Potassium 4.5; Sodium 135   Recent Lipid Panel Recent Labs    10/30/21 1203  CHOL 103  TRIG 83  HDL 48  LDLCALC 38     Prior CV Studies: {Select studies to display:26339}  ***    Risk Assessment/Calculations/Metrics:   {Does this patient have ATRIAL FIBRILLATION?:[phone]}     No BP recorded.  {Refresh Note OR Click here to enter BP  :1}***    Physical Exam:    VS:  There were no vitals taken for this visit.    Wt Readings from Last 3 Encounters:  04/08/22 168 lb (76.2 kg)  03/19/22 164 lb 3.2 oz (74.5 kg)  02/27/22 169 lb (76.7 kg)    Physical Exam  GEN: Well nourished, well developed, in no acute distress  HEENT:  normal  Neck: no JVD, carotid bruits, or masses Cardiac:RRR; no murmurs, rubs, or gallops  Respiratory:  clear to auscultation bilaterally, normal work of breathing GI: soft, nontender, nondistended, + BS Ext: without cyanosis, clubbing, or edema, Good distal pulses bilaterally MS: no deformity or atrophy  Skin: warm and dry, no rash Neuro:  Alert and Oriented x 3, Strength and sensation are intact Psych: euthymic mood, full affect       ASSESSMENT & PLAN:   No problem-specific Assessment & Plan notes found for this encounter.        {Are you ordering a CV Procedure (e.g. stress test, cath, DCCV, TEE, etc)?   Press F2        :[id number]}   Dispo:  No follow-ups on file.   Medication Adjustments/Labs and Tests Ordered: Current medicines are reviewed at length with the patient today.  Concerns regarding medicines are outlined above.  Tests Ordered: No orders of the defined types were placed in this encounter.  Medication Changes: No orders of the defined types were placed in this encounter.  Signed, Ermalinda Barrios, PA-C  04/20/2022 3:31 PM    [address], [place], [place]  [address] Phone: [phone]; Fax: [phone]

## 2022-04-21 ENCOUNTER — Telehealth: Payer: Self-pay

## 2022-04-21 ENCOUNTER — Ambulatory Visit: Payer: Medicare Other | Admitting: Physician Assistant

## 2022-04-21 NOTE — Telephone Encounter (Signed)
Transition Care Management Follow-up Telephone Call Date of discharge and from where: 04/18/22, [facility] DX covid-19, Epistaxis, Elevated INR How have you been since you were released from the hospital? Spoke w/pt's wife, [facility], pt is much better and nose bleed is under control. INR med is replace w/Eliquis per wife Any questions or concerns? No  Items Reviewed: Did the pt receive and understand the discharge instructions provided? Yes  Medications obtained and verified? Yes  Other? No  Any new allergies since your discharge? No  Dietary orders reviewed? No Do you have support at home? Yes   Home Care and Equipment/Supplies: Were home health services ordered? Per still gets PT w/[facility] once a week If so, what is the name of the agency? [facility]  Has the agency set up a time to come to the patient's home? Yes, once a wk Were any new equipment or medical supplies ordered?  No What is the name of the medical supply agency? N/a Were you able to get the supplies/equipment? not applicable Do you have any questions related to the use of the equipment or supplies? No  Functional Questionnaire: (I = Independent and D = Dependent) ADLs: I  Bathing/Dressing- I  Meal Prep- I  Eating- I  Maintaining continence- I  Transferring/Ambulation- I  Managing Meds- D  Follow up appointments reviewed:  PCP Hospital f/u appt confirmed? Yes  Scheduled to see 05/12/22 on Tuesday @ 3:00pm. [facility] f/u appt confirmed? Yes  Scheduled to see Monday on 05/04/22 @ 1:15pm. Are transportation arrangements needed?  Wife, will be taking pt If their condition worsens, is the pt aware to call PCP or go to the Emergency Dept.? Yes Was the patient provided with contact information for the PCP's office or ED? Yes Was to pt encouraged to call back with questions or concerns? Yes

## 2022-04-23 NOTE — Telephone Encounter (Signed)
OK with me.  Candee Furbish, MD     [facility] (friend) is aware.  [facility] reports pt was supposed to have been referred to cardiac rehab but has not been contacted.    Unable to locate any order or documentation regarding cardiac rehab.  Will send to Dr Marlou Porch for review and any orders.

## 2022-04-24 NOTE — Telephone Encounter (Signed)
Left message for [facility] had ordered pt to be seen by [facility].  Per her last note, they discuss advanced heart failure clinic as well as cardiac rehab.  Advised on message for [place].  Unsure if pt's DX will cover for cardiac rehab but if they want to try to get it approved, we can.  Requested she call back we their decision.

## 2022-04-27 ENCOUNTER — Other Ambulatory Visit: Payer: Self-pay | Admitting: Cardiology

## 2022-04-28 ENCOUNTER — Other Ambulatory Visit: Payer: Self-pay | Admitting: Cardiology

## 2022-04-28 DIAGNOSIS — I480 Paroxysmal atrial fibrillation: Secondary | ICD-10-CM

## 2022-04-28 NOTE — Telephone Encounter (Signed)
Prescription refill request for Eliquis received. Indication:Afib  Last office visit:04/08/22 Jackelyn Hoehn)  Scr:2.08 (04/18/22)  Age: 86 Weight: 76.2kg  Per ED note on 04/18/22:  "I have consulted cardiology and spoken with Dr. Julieanne Manson.  He recommends discontinuing his Pradaxa and starting on the renal dosing Eliquis therapy.  He will put in a note to schedule the patient for appointment with cardiology next week.  I discussed these findings with the patient and they understand the plan to stop Pradaxa and start Eliquis"   Appropriate dose and refill sent to requested pharmacy.

## 2022-05-03 NOTE — Progress Notes (Signed)
Office Visit    Patient Name: [facility] Date of Encounter: 05/04/2022  Primary Care Provider:  Susy Frizzle, MD Primary Cardiologist:  Candee Furbish, MD Primary Electrophysiologist: Vickie Epley, MD  Chief Complaint    [facility] is a 86 y.o. male with PMH of HTN, HLD, DM II, persistent AF s/p ablation 04/2020 with recurrence and status post DCCV in 05/2020 (now on Eliquis), melanoma, prostate CA OSA (on CPAP), HFrEF who presents today for follow-up of congestive heart failure and atrial fibrillation.  Past Medical History    Past Medical History:  Diagnosis Date   Anal fissure    Arthritis    Atrial fibrillation (HCC)    Back pain    CHF (congestive heart failure) ([place])    Colon polyps    Coronary artery disease    Diabetes mellitus type 2 with complications ([place])    Dysrhythmia    a-fib   GERD (gastroesophageal reflux disease)    History of echocardiogram 07/ 07/ 2011   History of lithotripsy 1989   Hyperlipidemia    Hypertension    Kidney stones    Melanoma ([place]) 05/01/2019   right chest wall (12/20)   Prostate CA ([place]) 2010   Sleep apnea    uses CPAP nightly   Squamous cell carcinoma of skin 05/23/1992   bowens-left parietal scalp (CX35FU)   Squamous cell carcinoma of skin 03/14/2008   in situ-left upper outer forehead-medial (CX35FU)   Squamous cell carcinoma of skin 03/14/2008   in situ-crown of scalp (Cx35FU)   Squamous cell carcinoma of skin 04/15/2011   in situ-right dorsal forearm (txpbx)   Squamous cell carcinoma of skin 10/16/2011   in situ-left sideburn   Squamous cell carcinoma of skin 03/11/2015   ka-left sideburn (CX35FU)   Squamous cell carcinoma of skin 03/11/2015   ka-left forearm (CX35FU)   Squamous cell carcinoma of skin 06/22/2018   in situ-left forearm, sup (txpbx)   Squamous cell carcinoma of skin 05/01/2019   in situ-mid anterior scalp    Squamous cell carcinoma of skin 05/01/2019   in situ-right upper arm   Past  Surgical History:  Procedure Laterality Date   ATRIAL FIBRILLATION ABLATION N/A 04/17/2020   Procedure: [place];  Surgeon: Vickie Epley, MD;  Location: [place] [facility];  Service: Cardiovascular;  Laterality: N/A;   AXILLARY SENTINEL NODE BIOPSY Right 08/23/2019   Procedure: SENTINEL LYMPH NODE BIOSPY RIGHT AXILLA;  Surgeon: Stark Klein, MD;  Location: [place];  Service: General;  Laterality: Right;   CARDIOVERSION N/A 05/10/2020   Procedure: CARDIOVERSION;  Surgeon: Freada Bergeron, MD;  Location: [place];  Service: Cardiovascular;  Laterality: N/A;   CARDIOVERSION N/A 05/08/2021   Procedure: CARDIOVERSION;  Surgeon: Donato Heinz, MD;  Location: [place];  Service: Cardiovascular;  Laterality: N/A;   CARDIOVERSION N/A 02/17/2022   Procedure: CARDIOVERSION;  Surgeon: Jerline Pain, MD;  Location: [facility] ENDOSCOPY;  Service: Cardiovascular;  Laterality: N/A;   COLONOSCOPY  2002   CORONARY ANGIOGRAPHY N/A 02/13/2022   Procedure: CORONARY ANGIOGRAPHY;  Surgeon: Sherren Mocha, MD;  Location: [place] [facility];  Service: Cardiovascular;  Laterality: N/A;   FASCIECTOMY Left 03/28/2019   Procedure: SEGMENTAL FASCIECTOMY LEFT RING FINGER;  Surgeon: Daryll Brod, MD;  Location: [place];  Service: Orthopedics;  Laterality: Left;  ANESTHESIA  AXILLARY BLOCK   INSERTION PROSTATE RADIATION SEED  8 4 2010   per Dr Rosana Hoes  INTRAVASCULAR ULTRASOUND/IVUS N/A 02/13/2022   Procedure: Intravascular Ultrasound/IVUS;  Surgeon: Sherren Mocha, MD;  Location: [place] [facility];  Service: Cardiovascular;  Laterality: N/A;   MELANOMA EXCISION Right 06/22/2019   Procedure: WIDE LOCAL EXCISION RIGHT CHEST WALL MELANOMA, ADVANCEMENT FLAP CLOSURE FOR DEFECT 3X6 CM;  Surgeon: Stark Klein, MD;  Location: [place];  Service: General;  Laterality: Right;   POLYPECTOMY  2002   RIGHT/LEFT HEART CATH AND CORONARY ANGIOGRAPHY N/A  02/11/2022   Procedure: RIGHT/LEFT HEART CATH AND CORONARY ANGIOGRAPHY;  Surgeon: Nelva Bush, MD;  Location: [place] [facility];  Service: Cardiovascular;  Laterality: N/A;   TEE WITHOUT CARDIOVERSION N/A 02/17/2022   Procedure: TRANSESOPHAGEAL ECHOCARDIOGRAM (TEE);  Surgeon: Jerline Pain, MD;  Location: [facility] ENDOSCOPY;  Service: Cardiovascular;  Laterality: N/A;   THROAT SURGERY  1980   benign cyst   UPPER GASTROINTESTINAL ENDOSCOPY     URETHRAL STRICTURE DILATATION     also penile implant    Allergies  No Known Allergies  History of Present Illness    [facility]  is a 86year old male with the above mention past medical history who presents today for CHF and atrial fibrillation.  Mr. Eisler was first diagnosed with atrial fibrillation in 2019 following urology office visit.  2D echo was completed that showed EF of 50-55%, no RWMA, grade 2 DD with mild aortic valve leaflet calcification, and moderately dilated LA, mild dilated RV.  Patient was evaluated by EP in 04/2020 and underwent atrial fibrillation ablation with recurrent A-fib 05/2020 that required DCCV x3 shocks.  He presented to [facility] ED on 02/2022 with a 2-week history of shortness of breath with no chest pain or palpitations noted.  He was also noted to have chronic lower extremity swelling with right worse than the left.  He is never aware when he is having atrial fibrillation and upon arrival to [facility] he was tachycardic with rates in the 100s.  EKG was performed and revealed AF with RVR.  He was diuresed with Lasix and 2D echo was completed showing EF of 25-30%, global hypokinesis, mild dilated LV, moderate LVH, normal RV systolic function, moderate biatrial enlargement, moderate tricuspid regurgitation, AV sclerosis without evidence of stenosis.  Patient had a R/LHC performed 02/11/2022 that demonstrated mild elevated left heart filling pressures with mild pulmonary HTN and mildly reduced cardiac index.  There was also severe ostial  left main disease with eccentric 70% stenosis and 20% mid LAD stenosis.  Patient was recommended to transfer to [facility] for possible revascularization of ostial/proximal left main.  Dr. Martinique consulted and plan for PCI with IVUS with determined.  IVUS revealed nonobstructing plaque in the distal and mid portions of the left main with no concentric stenosis.  Medical management was elected.  Plan was also made to pursue TEE cardioversion to restore sinus rhythm.  He was placed on amiodarone 200 mg daily and Pradaxa 150 mg twice daily.  He was most recently seen on 03/2022 by Christen Bame, NP for posthospital follow-up.  During visit patient reported gradual improvement with low energy and frequent urination.  No medication changes were made during visit and discussion was determine if patient should follow-up with advanced heart failure clinic regarding low EF in setting of atrial fibrillation.  Following discussion with Dr. Marlou Porch referral was placed for advanced CHF clinic.  Mr. Koval was admitted to the ED 9/9 with nosebleed due to Pradaxa and possible amiodarone interaction.  Patient was transitioned to Eliquis 2.5  mg with resolution of nosebleed.   Mr. Redmann presents today for post ED follow-up visit with his daughter.  Since last being seen in the office patient reports that he is doing much better and nosebleeds have subsided.  He is tolerating his current medications without any adverse reactions.  He does still report having low energy that may be attributed to some decreased mood related to current status of health.  We discussed the referral process and next  steps for being seen in the advanced heart failure clinic.  Blood pressure today were well controlled at 112/62 and pulse is 57.  Patient denies chest pain, palpitations, dyspnea, PND, orthopnea, nausea, vomiting, dizziness, syncope, edema, weight gain, or early satiety.   Home Medications    Current Outpatient Medications   Medication Sig Dispense Refill   acetaminophen (TYLENOL) 325 MG tablet Take 2 tablets (650 mg total) by mouth every 6 (six) hours as needed for mild pain (or Fever >/= 101).     amiodarone (PACERONE) 200 MG tablet Take 1 tablet (200 mg total) by mouth daily. 90 tablet 3   apixaban (ELIQUIS) 2.5 MG TABS tablet Take 1 tablet (2.5 mg total) by mouth 2 (two) times daily. 180 tablet 1   aspirin EC 81 MG tablet Take 1 tablet (81 mg total) by mouth daily. Swallow whole. 30 tablet 12   atorvastatin (LIPITOR) 80 MG tablet Take 1 tablet (80 mg total) by mouth daily. 90 tablet 3   cetirizine (ZYRTEC) 10 MG tablet TAKE 1 TABLET BY MOUTH EVERY DAY. NOT COVERED BY INSURANCE. 90 tablet 3   empagliflozin (JARDIANCE) 10 MG TABS tablet Take 1 tablet (10 mg total) by mouth daily before breakfast. 90 tablet 3   finasteride (PROSCAR) 5 MG tablet Take 5 mg by mouth daily.     furosemide (LASIX) 40 MG tablet Take 1 tablet (40 mg total) by mouth daily. 90 tablet 3   linagliptin (TRADJENTA) 5 MG TABS tablet Take 1 tablet (5 mg total) by mouth daily. 90 tablet 3   LORazepam (ATIVAN) 0.5 MG tablet TAKE 1 TABLET BY MOUTH 2 TIMES DAILY AS NEEDED FOR ANXIETY. 30 tablet 1   nitroGLYCERIN (NITROSTAT) 0.4 MG SL tablet Place 1 tablet (0.4 mg total) under the tongue every 5 (five) minutes x 3 doses as needed for chest pain. 25 tablet 12   ondansetron (ZOFRAN) 4 MG tablet Take 1 tablet (4 mg total) by mouth every 6 (six) hours as needed for nausea. 20 tablet 0   oxymetazoline (AFRIN NASAL SPRAY) 0.05 % nasal spray Place 1 spray into both nostrils 2 (two) times daily. 30 mL 0   spironolactone (ALDACTONE) 25 MG tablet Take 1/2 tablets (12.5 mg total) by mouth daily. 45 tablet 3   traZODone (DESYREL) 50 MG tablet Take 0.5 tablets (25 mg total) by mouth at bedtime as needed for sleep. 30 tablet 0   acetaminophen (TYLENOL) 650 MG suppository Place 1 suppository (650 mg total) rectally every 6 (six) hours as needed for mild pain (or Fever  >/= 101). (Patient not taking: Reported on 05/04/2022) 12 suppository 0   bacitracin ointment Apply 1 Application topically 2 (two) times daily. (Patient not taking: Reported on 05/04/2022) 120 g 0   budesonide-formoterol (SYMBICORT) 160-4.5 MCG/ACT inhaler Inhale 2 puffs into the lungs 2 (two) times daily. (Patient not taking: Reported on 02/27/2022) 1 each 3   diclofenac Sodium (VOLTAREN) 1 % GEL Apply 2 g topically 4 (four) times daily. (Patient not taking: Reported on 03/19/2022)  50 g 1   fluticasone (FLONASE) 50 MCG/ACT nasal spray Place 1 spray into both nostrils daily as needed for allergies or rhinitis. (Patient not taking: Reported on 04/08/2022)     metoprolol succinate (TOPROL-XL) 50 MG 24 hr tablet Take 1 tablet (50 mg total) by mouth daily. Take with or immediately following a meal. (Patient taking differently: Take 25 mg by mouth daily. Take with or immediately following a meal.) 90 tablet 3   mupirocin ointment (BACTROBAN) 2 % Apply 1 Application topically 2 (two) times daily. (Patient not taking: Reported on 04/08/2022) 22 g 1   No current facility-administered medications for this visit.     Review of Systems  Please see the history of present illness.    (+) Decreased energy  All other systems reviewed and are otherwise negative except as noted above.  Physical Exam    Wt Readings from Last 3 Encounters:  05/04/22 166 lb (75.3 kg)  04/08/22 168 lb (76.2 kg)  03/19/22 164 lb 3.2 oz (74.5 kg)   VS: Vitals:   05/04/22 1347  BP: 112/62  Pulse: (!) 57  SpO2: 98%  ,Body mass index is 24.51 kg/m.  Constitutional:      Appearance: Healthy appearance. Not in distress.  Neck:     Vascular: JVD normal.  Pulmonary:     Effort: Pulmonary effort is normal.     Breath sounds: No wheezing. No rales. Diminished in the bases Cardiovascular:     Normal rate. Regular rhythm. Normal S1. Normal S2.      Murmurs: There is no murmur.  Edema:    Peripheral edema absent.  Abdominal:      Palpations: Abdomen is soft non tender. There is no hepatomegaly.  Skin:    General: Skin is warm and dry.  Neurological:     General: No focal deficit present.     Mental Status: Alert and oriented to person, place and time.     Cranial Nerves: Cranial nerves are intact.  EKG/LABS/Other Studies Reviewed    ECG personally reviewed by me today -sinus bradycardia with rate of 57 and left ventricular hypertrophy with normal axis and no acute changes.  Risk Assessment/Calculations:    CHA2DS2-VASc Score = 6   This indicates a 9.7% annual risk of stroke. The patient's score is based upon: CHF History: 1 HTN History: 1 Diabetes History: 1 Stroke History: 0 Vascular Disease History: 1 Age Score: 2 Gender Score: 0           Lab Results  Component Value Date   WBC 4.4 04/18/2022   HGB 13.1 04/18/2022   HCT 39.0 04/18/2022   MCV 89.7 04/18/2022   PLT 127 (L) 04/18/2022   Lab Results  Component Value Date   CREATININE 2.08 (H) 04/18/2022   BUN 44 (H) 04/18/2022   NA 135 04/18/2022   K 4.5 04/18/2022   CL 101 04/18/2022   CO2 19 (L) 04/18/2022   Lab Results  Component Value Date   ALT 64 (H) 03/03/2022   AST 30 03/03/2022   ALKPHOS 102 03/03/2022   BILITOT 0.6 03/03/2022   Lab Results  Component Value Date   CHOL 103 10/30/2021   HDL 48 10/30/2021   LDLCALC 38 10/30/2021   TRIG 83 10/30/2021   CHOLHDL 2.1 10/30/2021    Lab Results  Component Value Date   HGBA1C 6.4 (H) 02/09/2022    Assessment & Plan    1.  Chronic systolic CHF: -TEE echo completed 02/2022 with EF  less than 20% and global hypokinesis with biatrial enlargement and mild to moderate MV regurgitation -Today patient is euvolemic on examination -Current GDMT consist of Jardiance 10 mg, Lasix 40 mg, Toprol 50 mg, spironolactone 12.5 mg daily.  Unable to titrate GDMT due to elevated creatinine and labile BP  2.  Persistent atrial fibrillation: -Patient recently admitted for nosebleed on Pradaxa  now switched to Eliquis 2.5 mg and tolerating well without any occult bleeding or nosebleeds -Patient is sinus bradycardia with rate of 57 today per EKG -Continue rate control with Toprol-XL and amiodarone 200 mg daily -We will check LFTs and TSH today for surveillance of Eliquis and amiodarone  3.  CKD stage III: -Creatinine on 9/9 elevated to 2.08 we will recheck today  4.  Essential hypertension: -Patient's blood pressure today was well controlled at 112/62 -Continue low-sodium heart healthy diet  Disposition: Follow-up with Candee Furbish, MD as scheduled  Medication Adjustments/Labs and Tests Ordered: Current medicines are reviewed at length with the patient today.  Concerns regarding medicines are outlined above.   Signed, Mable Fill, Marissa Nestle, NP 05/04/2022, 2:11 PM [facility]  Note:  This document was prepared using Dragon voice recognition software and may include unintentional dictation errors.

## 2022-05-04 ENCOUNTER — Encounter: Payer: Self-pay | Admitting: Nurse Practitioner

## 2022-05-04 ENCOUNTER — Ambulatory Visit: Payer: Medicare Other | Attending: Physician Assistant | Admitting: Nurse Practitioner

## 2022-05-04 DIAGNOSIS — I480 Paroxysmal atrial fibrillation: Secondary | ICD-10-CM

## 2022-05-04 DIAGNOSIS — Z79899 Other long term (current) drug therapy: Secondary | ICD-10-CM | POA: Diagnosis not present

## 2022-05-04 DIAGNOSIS — I10 Essential (primary) hypertension: Secondary | ICD-10-CM

## 2022-05-04 DIAGNOSIS — I2510 Atherosclerotic heart disease of native coronary artery without angina pectoris: Secondary | ICD-10-CM

## 2022-05-04 DIAGNOSIS — N179 Acute kidney failure, unspecified: Secondary | ICD-10-CM

## 2022-05-04 NOTE — Patient Instructions (Signed)
Medication Instructions:   Your physician recommends that you continue on your current medications as directed. Please refer to the Current Medication list given to you today.   *If you need a refill on your cardiac medications before your next appointment, please call your pharmacy*   Lab Work:  TODAY!!!!CMET/TSH  If you have labs (blood work) drawn today and your tests are completely normal, you will receive your results only by: [place] (if you have MyChart) OR A paper copy in the mail If you have any lab test that is abnormal or we need to change your treatment, we will call you to review the results.   Testing/Procedures:  None ordered.   Follow-Up: At [facility], you and your health needs are our priority.  As part of our continuing mission to provide you with exceptional heart care, we have created designated Provider Care Teams.  These Care Teams include your primary Cardiologist (physician) and Advanced Practice Providers (APPs -  Physician Assistants and Nurse Practitioners) who all work together to provide you with the care you need, when you need it.  We recommend signing up for the patient portal called "MyChart".  Sign up information is provided on this After Visit Summary.  MyChart is used to connect with patients for Virtual Visits (Telemedicine).  Patients are able to view lab/test results, encounter notes, upcoming appointments, etc.  Non-urgent messages can be sent to your provider as well.   To learn more about what you can do with MyChart, go to [email or web address].    Your next appointment:   2 month(s)  The format for your next appointment:   In Person  Provider:   Candee Furbish, MD     Important Information About [place]

## 2022-05-04 NOTE — Telephone Encounter (Signed)
Pt was seen today in office by Ambrose Pancoast.  Daughter present according to documentation.  Advanced Heart Failure Clinic referral was discussed but no cardiac rehab at this point.

## 2022-05-05 LAB — COMPREHENSIVE METABOLIC PANEL
ALT: 25 IU/L (ref 0–44)
AST: 25 IU/L (ref 0–40)
Albumin/Globulin Ratio: 2.1 (ref 1.2–2.2)
Albumin: 4.4 g/dL (ref 3.7–4.7)
Alkaline Phosphatase: 61 IU/L (ref 44–121)
BUN/Creatinine Ratio: 16 (ref 10–24)
BUN: 28 mg/dL — ABNORMAL HIGH (ref 8–27)
Bilirubin Total: 0.6 mg/dL (ref 0.0–1.2)
CO2: 22 mmol/L (ref 20–29)
Calcium: 9.3 mg/dL (ref 8.6–10.2)
Chloride: 101 mmol/L (ref 96–106)
Creatinine, Ser: 1.74 mg/dL — ABNORMAL HIGH (ref 0.76–1.27)
Globulin, Total: 2.1 g/dL (ref 1.5–4.5)
Glucose: 181 mg/dL — ABNORMAL HIGH (ref 70–99)
Potassium: 4.2 mmol/L (ref 3.5–5.2)
Sodium: 141 mmol/L (ref 134–144)
Total Protein: 6.5 g/dL (ref 6.0–8.5)
eGFR: 38 mL/min/{1.73_m2} — ABNORMAL LOW (ref >59)

## 2022-05-05 LAB — TSH: TSH: 1.55 u[IU]/mL (ref 0.450–4.500)

## 2022-05-12 ENCOUNTER — Ambulatory Visit (INDEPENDENT_AMBULATORY_CARE_PROVIDER_SITE_OTHER): Payer: Medicare Other | Admitting: Family Medicine

## 2022-05-12 VITALS — BP 118/62 | HR 85 | Ht 69.0 in | Wt 165.6 lb

## 2022-05-12 DIAGNOSIS — I5023 Acute on chronic systolic (congestive) heart failure: Secondary | ICD-10-CM

## 2022-05-12 NOTE — Progress Notes (Signed)
Wt Readings from Last 3 Encounters:  05/12/22 165 lb 9.6 oz (75.1 kg)  05/04/22 166 lb (75.3 kg)  04/08/22 168 lb (76.2 kg)     Subjective:    Patient ID: Antonio Clark, male    DOB: 1935-09-07, 86 y.o.   MRN: [id number] Patient was in the emergency room September 9 with an epistaxis.  At that time, his Pradaxa was switched to Eliquis 2.5 mg twice daily due to his chronic kidney disease.  His baseline creatinine is between 1.7 and 2.  He has not suffered any more epistaxis since September 9.  However he continues to report feeling fatigued and tired.  He denies any chest pain.  He denies any dyspnea orthopnea.  However he does report easy fatigability.  Had an echocardiogram in July.  Echocardiogram in July revealed an ejection fraction less than 20% with global hypokinesis. Past Medical History:  Diagnosis Date   Anal fissure    Arthritis    Atrial fibrillation (HCC)    Back pain    CHF (congestive heart failure) (HCC)    Colon polyps    Coronary artery disease    Diabetes mellitus type 2 with complications ([place])    Dysrhythmia    a-fib   GERD (gastroesophageal reflux disease)    History of echocardiogram 07/ 07/ 2011   History of lithotripsy 1989   Hyperlipidemia    Hypertension    Kidney stones    Melanoma ([place]) 05/01/2019   right chest wall (12/20)   Prostate CA ([place]) 2010   Sleep apnea    uses CPAP nightly   Squamous cell carcinoma of skin 05/23/1992   bowens-left parietal scalp (CX35FU)   Squamous cell carcinoma of skin 03/14/2008   in situ-left upper outer forehead-medial ([id number])   Squamous cell carcinoma of skin 03/14/2008   in situ-crown of scalp ([id number])   Squamous cell carcinoma of skin 04/15/2011   in situ-right dorsal forearm (txpbx)   Squamous cell carcinoma of skin 10/16/2011   in situ-left sideburn   Squamous cell carcinoma of skin 03/11/2015   ka-left sideburn ([id number])   Squamous cell carcinoma of skin 03/11/2015   ka-left forearm ([id number])   Squamous  cell carcinoma of skin 06/22/2018   in situ-left forearm, sup (txpbx)   Squamous cell carcinoma of skin 05/01/2019   in situ-mid anterior scalp    Squamous cell carcinoma of skin 05/01/2019   in situ-right upper arm   Past Surgical History:  Procedure Laterality Date   ATRIAL FIBRILLATION ABLATION N/A 04/17/2020   Procedure: [place];  Surgeon: Vickie Epley, MD;  Location: [facility];  Service: Cardiovascular;  Laterality: N/A;   AXILLARY SENTINEL NODE BIOPSY Right 08/23/2019   Procedure: SENTINEL LYMPH NODE BIOSPY RIGHT AXILLA;  Surgeon: Stark Klein, MD;  Location: [facility];  Service: General;  Laterality: Right;   CARDIOVERSION N/A 05/10/2020   Procedure: CARDIOVERSION;  Surgeon: Freada Bergeron, MD;  Location: [facility];  Service: Cardiovascular;  Laterality: N/A;   CARDIOVERSION N/A 05/08/2021   Procedure: CARDIOVERSION;  Surgeon: Donato Heinz, MD;  Location: [place];  Service: Cardiovascular;  Laterality: N/A;   CARDIOVERSION N/A 02/17/2022   Procedure: CARDIOVERSION;  Surgeon: Jerline Pain, MD;  Location: [facility]OSCOPY;  Service: Cardiovascular;  Laterality: N/A;   COLONOSCOPY  2002   CORONARY ANGIOGRAPHY N/A 02/13/2022   Procedure: CORONARY ANGIOGRAPHY;  Surgeon: Sherren Mocha, MD;  Location: [place] [facility];  Service: Cardiovascular;  Laterality: N/A;  FASCIECTOMY Left 03/28/2019   Procedure: SEGMENTAL FASCIECTOMY LEFT RING FINGER;  Surgeon: Daryll Brod, MD;  Location: [place];  Service: Orthopedics;  Laterality: Left;  ANESTHESIA  AXILLARY BLOCK   INSERTION PROSTATE RADIATION SEED  8 4 2010   per Dr Gwenevere Ghazi ULTRASOUND/IVUS N/A 02/13/2022   Procedure: Intravascular Ultrasound/IVUS;  Surgeon: Sherren Mocha, MD;  Location: [place] [facility];  Service: Cardiovascular;  Laterality: N/A;   MELANOMA EXCISION Right 06/22/2019   Procedure: WIDE LOCAL EXCISION RIGHT CHEST WALL  MELANOMA, ADVANCEMENT FLAP CLOSURE FOR DEFECT 3X6 CM;  Surgeon: Stark Klein, MD;  Location: [place];  Service: General;  Laterality: Right;   POLYPECTOMY  2002   RIGHT/LEFT HEART CATH AND CORONARY ANGIOGRAPHY N/A 02/11/2022   Procedure: RIGHT/LEFT HEART CATH AND CORONARY ANGIOGRAPHY;  Surgeon: Nelva Bush, MD;  Location: [facility];  Service: Cardiovascular;  Laterality: N/A;   TEE WITHOUT CARDIOVERSION N/A 02/17/2022   Procedure: TRANSESOPHAGEAL ECHOCARDIOGRAM (TEE);  Surgeon: Jerline Pain, MD;  Location: [facility] ENDOSCOPY;  Service: Cardiovascular;  Laterality: N/A;   THROAT SURGERY  1980   benign cyst   UPPER GASTROINTESTINAL ENDOSCOPY     URETHRAL STRICTURE DILATATION     also penile implant   Current Outpatient Medications on File Prior to Visit  Medication Sig Dispense Refill   acetaminophen (TYLENOL) 325 MG tablet Take 2 tablets (650 mg total) by mouth every 6 (six) hours as needed for mild pain (or Fever >/= 101).     amiodarone (PACERONE) 200 MG tablet Take 1 tablet (200 mg total) by mouth daily. 90 tablet 3   apixaban (ELIQUIS) 2.5 MG TABS tablet Take 1 tablet (2.5 mg total) by mouth 2 (two) times daily. 180 tablet 1   aspirin EC 81 MG tablet Take 1 tablet (81 mg total) by mouth daily. Swallow whole. 30 tablet 12   atorvastatin (LIPITOR) 80 MG tablet Take 1 tablet (80 mg total) by mouth daily. 90 tablet 3   bacitracin ointment Apply 1 Application topically 2 (two) times daily. 120 g 0   budesonide-formoterol (SYMBICORT) 160-4.5 MCG/ACT inhaler Inhale 2 puffs into the lungs 2 (two) times daily. 1 each 3   cetirizine (ZYRTEC) 10 MG tablet TAKE 1 TABLET BY MOUTH EVERY DAY. NOT COVERED BY INSURANCE. 90 tablet 3   diclofenac Sodium (VOLTAREN) 1 % GEL Apply 2 g topically 4 (four) times daily. 50 g 1   empagliflozin (JARDIANCE) 10 MG TABS tablet Take 1 tablet (10 mg total) by mouth daily before breakfast. 90 tablet 3   finasteride (PROSCAR) 5 MG tablet Take 5  mg by mouth daily.     fluticasone (FLONASE) 50 MCG/ACT nasal spray Place 1 spray into both nostrils daily as needed for allergies or rhinitis.     furosemide (LASIX) 40 MG tablet Take 1 tablet (40 mg total) by mouth daily. 90 tablet 3   linagliptin (TRADJENTA) 5 MG TABS tablet Take 1 tablet (5 mg total) by mouth daily. 90 tablet 3   LORazepam (ATIVAN) 0.5 MG tablet TAKE 1 TABLET BY MOUTH 2 TIMES DAILY AS NEEDED FOR ANXIETY. 30 tablet 1   metoprolol succinate (TOPROL-XL) 50 MG 24 hr tablet Take 1 tablet (50 mg total) by mouth daily. Take with or immediately following a meal. (Patient taking differently: Take 25 mg by mouth daily. Take with or immediately following a meal.) 90 tablet 3   mupirocin ointment (BACTROBAN) 2 % Apply 1 Application topically 2 (two) times daily.  22 g 1   nitroGLYCERIN (NITROSTAT) 0.4 MG SL tablet Place 1 tablet (0.4 mg total) under the tongue every 5 (five) minutes x 3 doses as needed for chest pain. 25 tablet 12   ondansetron (ZOFRAN) 4 MG tablet Take 1 tablet (4 mg total) by mouth every 6 (six) hours as needed for nausea. 20 tablet 0   oxymetazoline (AFRIN NASAL SPRAY) 0.05 % nasal spray Place 1 spray into both nostrils 2 (two) times daily. 30 mL 0   spironolactone (ALDACTONE) 25 MG tablet Take 1/2 tablets (12.5 mg total) by mouth daily. 45 tablet 3   traZODone (DESYREL) 50 MG tablet Take 0.5 tablets (25 mg total) by mouth at bedtime as needed for sleep. 30 tablet 0   acetaminophen (TYLENOL) 650 MG suppository Place 1 suppository (650 mg total) rectally every 6 (six) hours as needed for mild pain (or Fever >/= 101). (Patient not taking: Reported on 05/04/2022) 12 suppository 0   No current facility-administered medications on file prior to visit.   No Known Allergies Social History   Socioeconomic History   Marital status: Single    Spouse name: Not on file   Number of children: 0   Years of education: Not on file   Highest education level: Not on file   Occupational History   Occupation: retired  Tobacco Use   Smoking status: Never   Smokeless tobacco: Never  Vaping Use   Vaping Use: Never used  Substance and Sexual Activity   Alcohol use: Not Currently    Comment: social   Drug use: No   Sexual activity: Yes  Other Topics Concern   Not on file  Social History Narrative   Not on file   Social Determinants of Health   Financial Resource Strain: Low Risk  (10/30/2021)   Overall Financial Resource Strain (CARDIA)    Difficulty of Paying Living Expenses: Not hard at all  Food Insecurity: No Food Insecurity (10/30/2021)   Hunger Vital Sign    Worried About Running Out of Food in the Last Year: Never true    [place] in the Last Year: Never true  Transportation Needs: No Transportation Needs (10/30/2021)   PRAPARE - Hydrologist (Medical): No    Lack of Transportation (Non-Medical): No  Physical Activity: Inactive (10/30/2021)   Exercise Vital Sign    Days of Exercise per Week: 0 days    Minutes of Exercise per Session: 0 min  Stress: No Stress Concern Present (10/30/2021)   [place]    Feeling of Stress : Not at all  Social Connections: Moderately Integrated (10/30/2021)   Social Connection and Isolation Panel [NHANES]    Frequency of Communication with Friends and Family: More than three times a week    Frequency of Social Gatherings with Friends and Family: More than three times a week    Attends Religious Services: More than 4 times per year    Active Member of [employer] or Organizations: Yes    Attends Archivist Meetings: More than 4 times per year    Marital Status: Never married  Intimate Partner Violence: Not At Risk (10/30/2021)   Humiliation, Afraid, Rape, and Kick questionnaire    Fear of Current or Ex-Partner: No    Emotionally Abused: No    Physically Abused: No    Sexually Abused: No   Family History   Problem Relation Age of Onset  Coronary artery disease Brother        3 brothers had CABG   Arrhythmia Brother    Diabetes Brother    Hearing loss Brother    Hypertension Brother    Heart disease Brother    Heart failure Brother    Hypertension Brother    Heart disease Father    Hypertension Father    Sudden death Father    Stroke Mother        cerebral hemorrage   Colon cancer Mother 86       small intestine   Depression Mother    Stroke Sister    Suicidality Sister    Diabetes Brother    Hypertension Brother    Hyperlipidemia Sister    Esophageal cancer Neg Hx    Pancreatic cancer Neg Hx    Prostate cancer Neg Hx    Rectal cancer Neg Hx    Stomach cancer Neg Hx      Review of Systems  Respiratory:  Positive for cough.   All other systems reviewed and are negative.      Objective:   Physical Exam Vitals reviewed.  Constitutional:      General: He is not in acute distress.    Appearance: He is well-developed. He is not diaphoretic.  HENT:     Head: Normocephalic and atraumatic.     Right Ear: External ear normal.     Left Ear: External ear normal.     Nose: Nose normal.     Mouth/Throat:     Pharynx: No oropharyngeal exudate.  Eyes:     General: No scleral icterus.       Right eye: No discharge.        Left eye: No discharge.     Conjunctiva/sclera: Conjunctivae normal.     Pupils: Pupils are equal, round, and reactive to light.  Neck:     Thyroid: No thyromegaly.     Vascular: No JVD.     Trachea: No tracheal deviation.  Cardiovascular:     Rate and Rhythm: Tachycardia present. Rhythm irregularly irregular.     Heart sounds: Normal heart sounds. No murmur heard.    No friction rub. No gallop.  Pulmonary:     Effort: Pulmonary effort is normal. No respiratory distress.     Breath sounds: No stridor. No wheezing, rhonchi or rales.  Abdominal:     General: Bowel sounds are normal. There is no distension.     Palpations: Abdomen is soft. There is  no mass.     Tenderness: There is no abdominal tenderness. There is no guarding or rebound.  Musculoskeletal:        General: No tenderness. Normal range of motion.     Cervical back: Normal range of motion and neck supple.  Lymphadenopathy:     Cervical: No cervical adenopathy.  Skin:    General: Skin is warm.     Coloration: Skin is not pale.     Findings: No erythema or rash.  Neurological:     Mental Status: He is alert and oriented to person, place, and time.     Cranial Nerves: No cranial nerve deficit.     Motor: No abnormal muscle tone.     Coordination: Coordination normal.     Deep Tendon Reflexes: Reflexes are normal and symmetric.  Psychiatric:        Behavior: Behavior normal.        Thought Content: Thought content normal.  Judgment: Judgment normal.        Assessment & Plan:  Acute on chronic systolic heart failure (HCC) - Plan: CBC with Differential/Platelet, BASIC METABOLIC PANEL WITH GFR  I explained to the patient that I feel that his fatigue is most likely due to his chronic systolic heart failure.  The patient has an appointment to see his cardiologist in November at which time I suspect that they will likely repeat his echocardiogram to determine if his ejection fraction has improved.  At the present time is less than 20% despite being on metoprolol, spironolactone, as well as Jardiance.  Patient is not currently on Entresto.  I believe that this is being held most likely due to hypotension and chronic kidney disease.  I will repeat a BMP today to monitor his potassium as well as creatinine.  May consider adding a low-dose angiotensin receptor blocker if his potassium and creatinine will tolerate

## 2022-05-13 LAB — BASIC METABOLIC PANEL WITH GFR
BUN/Creatinine Ratio: 25 (calc) — ABNORMAL HIGH (ref 6–22)
BUN: 48 mg/dL — ABNORMAL HIGH (ref 7–25)
CO2: 26 mmol/L (ref 20–32)
Calcium: 9.7 mg/dL (ref 8.6–10.3)
Chloride: 101 mmol/L (ref 98–110)
Creat: 1.94 mg/dL — ABNORMAL HIGH (ref 0.70–1.22)
Glucose, Bld: 146 mg/dL — ABNORMAL HIGH (ref 65–99)
Potassium: 4.9 mmol/L (ref 3.5–5.3)
Sodium: 137 mmol/L (ref 135–146)
eGFR: 34 mL/min/{1.73_m2} — ABNORMAL LOW (ref >=60)

## 2022-05-13 LAB — CBC WITH DIFFERENTIAL/PLATELET
Absolute Monocytes: 881 cells/uL (ref 200–950)
Basophils Absolute: 59 cells/uL (ref 0–200)
Basophils Relative: 0.8 %
Eosinophils Absolute: 163 cells/uL (ref 15–500)
Eosinophils Relative: 2.2 %
HCT: 38.9 % (ref 38.5–50.0)
Hemoglobin: 13.3 g/dL (ref 13.2–17.1)
Lymphs Abs: 2072 cells/uL (ref 850–3900)
MCH: 31.1 pg (ref 27.0–33.0)
MCHC: 34.2 g/dL (ref 32.0–36.0)
MCV: 91.1 fL (ref 80.0–100.0)
MPV: 10 fL (ref 7.5–12.5)
Monocytes Relative: 11.9 %
Neutro Abs: 4225 cells/uL (ref 1500–7800)
Neutrophils Relative %: 57.1 %
Platelets: 167 10*3/uL (ref 140–400)
RBC: 4.27 10*6/uL (ref 4.20–5.80)
RDW: 16.1 % — ABNORMAL HIGH (ref 11.0–15.0)
Total Lymphocyte: 28 %
WBC: 7.4 10*3/uL (ref 3.8–10.8)

## 2022-05-15 ENCOUNTER — Telehealth: Payer: Self-pay | Admitting: Cardiology

## 2022-05-15 NOTE — Telephone Encounter (Signed)
Decrease amiodarone to '100mg'$  daily  Candee Furbish, MD   Antonio Clark is aware of above order.  She will split the 200 mg tablets in half.  She will call back if no improvement.

## 2022-05-15 NOTE — Telephone Encounter (Signed)
Pt c/o medication issue:  1. Name of Medication:  spironolactone (ALDACTONE) 25 MG tablet amiodarone (PACERONE) 200 MG tablet apixaban (ELIQUIS) 2.5 MG TABS tablet  2. How are you currently taking this medication (dosage and times per day)? As prescribed  3. Are you having a reaction (difficulty breathing--STAT)? Yes  4. What is your medication issue?  Pt has been experiencing diarrhea daily since med changes were made while in hosp and believes it may be due to these chances. Requesting call back to discuss.

## 2022-05-15 NOTE — Telephone Encounter (Signed)
Spoke with [name] (caregiver) who reports per asked her to call regarding diarrhea he has been having since medication changes that occurred during hospitalization in July.  This has been occurring off and on since then, no rhyme or reason, no specific time of day but does cause urgency.  Pt is concerned one of the 3 medications listed could be the cause.  Advised I will have MD to review and call back with further instructions.

## 2022-05-18 ENCOUNTER — Other Ambulatory Visit: Payer: Self-pay | Admitting: *Deleted

## 2022-05-18 MED ORDER — SPIRONOLACTONE 25 MG PO TABS: 12.5000 mg | ORAL_TABLET | Freq: Every day | ORAL | 3 refills | Status: DC

## 2022-05-27 ENCOUNTER — Telehealth (HOSPITAL_COMMUNITY): Payer: Self-pay | Admitting: Vascular Surgery

## 2022-05-27 NOTE — Telephone Encounter (Signed)
Lvm to make pt new chf appt/ with any provider

## 2022-05-28 ENCOUNTER — Encounter (HOSPITAL_COMMUNITY): Payer: Self-pay | Admitting: Cardiology

## 2022-05-28 ENCOUNTER — Ambulatory Visit (HOSPITAL_COMMUNITY)
Admission: RE | Admit: 2022-05-28 | Discharge: 2022-05-28 | Disposition: A | Discharge status: Home / Self Care | Payer: Medicare Other | Source: Ambulatory Visit | Attending: Cardiology | Admitting: Cardiology

## 2022-05-28 VITALS — BP 102/60 | HR 62 | Wt 166.6 lb

## 2022-05-28 DIAGNOSIS — I5022 Chronic systolic (congestive) heart failure: Secondary | ICD-10-CM | POA: Diagnosis present

## 2022-05-28 DIAGNOSIS — I2510 Atherosclerotic heart disease of native coronary artery without angina pectoris: Secondary | ICD-10-CM

## 2022-05-28 DIAGNOSIS — I480 Paroxysmal atrial fibrillation: Secondary | ICD-10-CM | POA: Diagnosis not present

## 2022-05-28 LAB — LIPID PANEL
Cholesterol: 142 mg/dL (ref 0–200)
HDL: 55 mg/dL (ref >40)
LDL Cholesterol: 54 mg/dL (ref 0–99)
Total CHOL/HDL Ratio: 2.6 RATIO
Triglycerides: 167 mg/dL — ABNORMAL HIGH (ref <150)
VLDL: 33 mg/dL (ref 0–40)

## 2022-05-28 LAB — COMPREHENSIVE METABOLIC PANEL
ALT: 29 U/L (ref 0–44)
AST: 28 U/L (ref 15–41)
Albumin: 4.2 g/dL (ref 3.5–5.0)
Alkaline Phosphatase: 51 U/L (ref 38–126)
Anion gap: 10 (ref 5–15)
BUN: 42 mg/dL — ABNORMAL HIGH (ref 8–23)
CO2: 24 mmol/L (ref 22–32)
Calcium: 9.9 mg/dL (ref 8.9–10.3)
Chloride: 104 mmol/L (ref 98–111)
Creatinine, Ser: 2.2 mg/dL — ABNORMAL HIGH (ref 0.61–1.24)
GFR, Estimated: 29 mL/min — ABNORMAL LOW (ref >60)
Glucose, Bld: 195 mg/dL — ABNORMAL HIGH (ref 70–99)
Potassium: 5 mmol/L (ref 3.5–5.1)
Sodium: 138 mmol/L (ref 135–145)
Total Bilirubin: 1.1 mg/dL (ref 0.3–1.2)
Total Protein: 7.2 g/dL (ref 6.5–8.1)

## 2022-05-28 LAB — BRAIN NATRIURETIC PEPTIDE: B Natriuretic Peptide: 125.5 pg/mL — ABNORMAL HIGH (ref 0.0–100.0)

## 2022-05-28 LAB — TSH: TSH: 1.559 u[IU]/mL (ref 0.350–4.500)

## 2022-05-28 MED ORDER — SPIRONOLACTONE 25 MG PO TABS: 12.5000 mg | ORAL_TABLET | Freq: Every evening | ORAL | 3 refills | Status: DC

## 2022-05-28 MED ORDER — METOPROLOL SUCCINATE ER 50 MG PO TB24: 25.0000 mg | ORAL_TABLET | Freq: Every evening | ORAL | 5 refills | Status: DC

## 2022-05-28 NOTE — Patient Instructions (Signed)
Good to see you today!  Take toprol xl Nightly  Take Spironolactone nightly  STOP aspirin  Your physician has requested that you have an echocardiogram. Echocardiography is a painless test that uses sound waves to create images of your heart. It provides your doctor with information about the size and shape of your heart and how well your heart's chambers and valves are working. This procedure takes approximately one hour. There are no restrictions for this procedure. Please do NOT wear cologne, perfume, aftershave, or lotions (deodorant is allowed). Please arrive 15 minutes prior to your appointment time.  Your physician recommends that you schedule a follow-up appointment in: 2 months with echocardiogram  You have been referred to cardiac rehab,they will call to schedule the appointment  If you have any questions or concerns before your next appointment please send [place] a message through [facility] or call our office at [phone].    TO LEAVE A MESSAGE FOR THE NURSE SELECT OPTION 2, PLEASE LEAVE A MESSAGE INCLUDING: YOUR NAME DATE OF BIRTH CALL BACK NUMBER REASON FOR CALL**this is important as we prioritize the call backs  YOU WILL RECEIVE A CALL BACK THE SAME DAY AS LONG AS YOU CALL BEFORE 4:00 PM  At the [facility], you and your health needs are our priority. As part of our continuing mission to provide you with exceptional heart care, we have created designated Provider Care Teams. These Care Teams include your primary Cardiologist (physician) and Advanced Practice Providers (APPs- Physician Assistants and Nurse Practitioners) who all work together to provide you with the care you need, when you need it.   You may see any of the following providers on your designated Care Team at your next follow up: Dr Glori Bickers Dr Loralie Champagne Dr. Roxana Hires, NP Lyda Jester, [place] [facility], [place] Forestine Na, NP Audry Riles,  PharmD   Please be sure to bring in all your medications bottles to every appointment.

## 2022-05-29 ENCOUNTER — Other Ambulatory Visit (HOSPITAL_COMMUNITY): Payer: Self-pay

## 2022-05-29 MED ORDER — FUROSEMIDE 40 MG PO TABS: 40.0000 mg | ORAL_TABLET | Freq: Every day | ORAL | 3 refills | Status: DC

## 2022-05-29 MED ORDER — ATORVASTATIN CALCIUM 80 MG PO TABS: 80.0000 mg | ORAL_TABLET | Freq: Every day | ORAL | 3 refills | Status: DC

## 2022-05-29 MED ORDER — AMIODARONE HCL 200 MG PO TABS: 100.0000 mg | ORAL_TABLET | Freq: Every day | ORAL | 3 refills | Status: DC

## 2022-05-30 NOTE — Progress Notes (Signed)
Patient ID: Antonio Clark, male   DOB: 1935/11/10, 86 y.o.   MRN: [id number] PCP: Dr. Dennard Schaumann HF Cardiology: Dr. Aundra Dubin  86 y.o. with history of HTN, hyperlipidemia, CAD, atrial fibrillation, and CHF was referred by Dr. Marlou Porch for evaluation of CHF.  Patient had atrial fibrillation diagnosed in 2019.  He had atrial fibrillation ablation in 9/21.  He was admitted in 7/23 with CHF and was found to have atrial fibrillation with RVR. Echo this admission showed EF 25-30%, mild LV dilation, RV normal, moderate biatrial enlargement.  LHC/RHC showed elevated PCWP and 70% left main stenosis.  By IVUS, the left main stenosis did not appear significant so PCI was not done.  Patient had TEE-guided DCCV.   Patient still feels like his energy level is poor but not as bad as when he is in atrial fibrillation.  No significant exertional dyspnea, just gets tired easily.  No lightheadedness.  No chest pain. Has OSA but cannot tolerate CPAP.  He lives alone.   ECG: NSR, nonspecific T wave flattening  Labs (10/23): K 4.9, creatinine 1.94, hgb 13.8  PMH: 1. Prostate cancer: s/p seed implantation 8/10.  2. Hyperlipidemia: Myalgias with one of the statins, ? Crestor 3. HTN 4. Type 2 diabetes 5. CKD stage 3 6. Atrial fibrillation: Paroxysmal, diagnosed in 2019.  - Atrial fibrillation ablation in 9/21.  - DCCV 7/23 7. OSA: Cannot tolerate CPAP 8. Melanoma 9. CAD: LHC in 7/23 showed 70% osital left main stenosis, IVUS suggested not significant and PCI not done.  10. Chronic systolic CHF: Suspect primarily nonischemic, ?tachycardia-mediated.  - RHC (7/23): mean RA 8, PA 50/22, mean PCWP 20, CI 2.4 - Echo (7/23): EF 25-30%, mild LV dilation, RV normal, moderate biatrial enlargement.  - TEE (7/23): EF < 20%, moderate RV dysfunction, severe biatrial enlargement, mild-moderate MR.    SH: Never smoked, lives in [place], retired from [employer], never married  FH: 3 brothers with CAD, all found in late  74s-70s.  Father with sudden cardiac death.  Mother with CVA.   ROS: All systems reviewed and negative except as per HPI.   Current Outpatient Medications  Medication Sig Dispense Refill   acetaminophen (TYLENOL) 325 MG tablet Take 2 tablets (650 mg total) by mouth every 6 (six) hours as needed for mild pain (or Fever >/= 101).     apixaban (ELIQUIS) 2.5 MG TABS tablet Take 1 tablet (2.5 mg total) by mouth 2 (two) times daily. 180 tablet 1   empagliflozin (JARDIANCE) 10 MG TABS tablet Take 1 tablet (10 mg total) by mouth daily before breakfast. 90 tablet 3   finasteride (PROSCAR) 5 MG tablet Take 5 mg by mouth daily.     fluticasone (FLONASE) 50 MCG/ACT nasal spray Place 1 spray into both nostrils daily as needed for allergies or rhinitis.     linagliptin (TRADJENTA) 5 MG TABS tablet Take 1 tablet (5 mg total) by mouth daily. 90 tablet 3   LORazepam (ATIVAN) 0.5 MG tablet TAKE 1 TABLET BY MOUTH 2 TIMES DAILY AS NEEDED FOR ANXIETY. 30 tablet 1   nitroGLYCERIN (NITROSTAT) 0.4 MG SL tablet Place 1 tablet (0.4 mg total) under the tongue every 5 (five) minutes x 3 doses as needed for chest pain. 25 tablet 12   ondansetron (ZOFRAN) 4 MG tablet Take 1 tablet (4 mg total) by mouth every 6 (six) hours as needed for nausea. 20 tablet 0   oxymetazoline (AFRIN NASAL SPRAY) 0.05 % nasal spray Place 1 spray into  both nostrils 2 (two) times daily. 30 mL 0   traZODone (DESYREL) 50 MG tablet Take 0.5 tablets (25 mg total) by mouth at bedtime as needed for sleep. 30 tablet 0   amiodarone (PACERONE) 200 MG tablet Take 0.5 tablets (100 mg total) by mouth daily. 45 tablet 3   atorvastatin (LIPITOR) 80 MG tablet Take 1 tablet (80 mg total) by mouth daily. 90 tablet 3   furosemide (LASIX) 40 MG tablet Take 1 tablet (40 mg total) by mouth daily. 90 tablet 3   metoprolol succinate (TOPROL-XL) 50 MG 24 hr tablet Take 0.5 tablets (25 mg total) by mouth at bedtime. Take with or immediately following a meal. 30 tablet 5    spironolactone (ALDACTONE) 25 MG tablet Take 0.5 tablets (12.5 mg total) by mouth at bedtime. 45 tablet 3   No current facility-administered medications for this encounter.    BP 102/60   Pulse 62   Wt 75.6 kg (166 lb 9.6 oz)   SpO2 98%   BMI 24.60 kg/m  General: NAD Neck: No JVD, no thyromegaly or thyroid nodule.  Lungs: Clear to auscultation bilaterally with normal respiratory effort. CV: Nondisplaced PMI.  Heart regular S1/S2, no S3/S4, no murmur.  No peripheral edema.  No carotid bruit.  Normal pedal pulses.  Abdomen: Soft, nontender, no hepatosplenomegaly, no distention.  Skin: Intact without lesions or rashes.  Neurologic: Alert and oriented x 3.  Psych: Normal affect. Extremities: No clubbing or cyanosis.  HEENT: Normal.   Assessment/Plan: 1. CAD:  70% left main stenosis on 7/23 cath.  IVUS suggested that this was not hemodynamically significant and PCI was not done.  No chest pain.  - Continue atorvastatin 80 mg daily, check lipids today.  - Given Eliquis use and no h/o PCI, I will have him stop ASA.  2. Chronic systolic CHF: Suspect nonischemic cardiomyopathy, possibly tachycardia-mediated. As above, he has coronary disease but this did not appear hemodynamically significant by IVUS evaluation.  Echo in 7/23 showed EF 25-30%, mild LV dilation, RV normal, moderate biatrial enlargement. He is not volume overloaded on exam.  NYHA class III symptoms but this is primarily due to fatigue and not dyspnea. BP runs on the low side.  - Would continue Toprol XL 25 mg daily and spironolactone 12.5 mg daily but move these medications to qhs.  - I will arrange for repeat echo now that he has been in NSR for several months to look for recovery of EF. Would not place ICD given age, not CRT candidate.  - Continue empagliflozin 10 mg daily.  - Would not start ARB/ARNI with low BP and elevated creatinine.  - I will refer for cardiac rehab at [facility].  3. Atrial fibrillation: Paroxysmal, s/p  ablation in 9/21 and DCCV in 7/23.  He is in NSR today, symptoms worse when in AF.  ?Tachycardia-mediated CMP. Need to keep in NSR.  - Continue Eliquis 2.5 mg bid (age, creatinine).  - Continue amiodarone 100 mg daily, check LFTs and TSH.  He will need a regular eye exam.  3. Hyperlipidemia: Check lipids on atorvastatin.   4. OSA: Unable to tolerate CPAP.  5. CKD: Stage 3, BMET today.   Followup in 2 months.   Loralie Champagne 05/30/2022

## 2022-06-02 ENCOUNTER — Telehealth (HOSPITAL_COMMUNITY): Payer: Self-pay | Admitting: Surgery

## 2022-06-02 DIAGNOSIS — I5022 Chronic systolic (congestive) heart failure: Secondary | ICD-10-CM

## 2022-06-02 MED ORDER — FUROSEMIDE 40 MG PO TABS: 20.0000 mg | ORAL_TABLET | Freq: Every day | ORAL | 3 refills | Status: DC

## 2022-06-02 NOTE — Telephone Encounter (Signed)
Patient called and I spoke with caregiver.  She is aware and will adjust medications as of tomorrow.  He will return for labwork next Wednesday.

## 2022-06-02 NOTE — Telephone Encounter (Signed)
-----   Message from Larey Dresser, MD sent at 05/30/2022 10:39 PM EDT ----- Hold Lasix for a day then decrease to 20 mg daily with BMET 1 week.

## 2022-06-10 ENCOUNTER — Ambulatory Visit (HOSPITAL_COMMUNITY)
Admission: RE | Admit: 2022-06-10 | Discharge: 2022-06-10 | Disposition: A | Discharge status: Home / Self Care | Payer: Medicare Other | Source: Ambulatory Visit | Attending: Internal Medicine | Admitting: Internal Medicine

## 2022-06-10 DIAGNOSIS — I5022 Chronic systolic (congestive) heart failure: Secondary | ICD-10-CM | POA: Diagnosis present

## 2022-06-10 LAB — BASIC METABOLIC PANEL
Anion gap: 8 (ref 5–15)
BUN: 26 mg/dL — ABNORMAL HIGH (ref 8–23)
CO2: 23 mmol/L (ref 22–32)
Calcium: 9.5 mg/dL (ref 8.9–10.3)
Chloride: 109 mmol/L (ref 98–111)
Creatinine, Ser: 1.6 mg/dL — ABNORMAL HIGH (ref 0.61–1.24)
GFR, Estimated: 42 mL/min — ABNORMAL LOW (ref >60)
Glucose, Bld: 168 mg/dL — ABNORMAL HIGH (ref 70–99)
Potassium: 5.4 mmol/L — ABNORMAL HIGH (ref 3.5–5.1)
Sodium: 140 mmol/L (ref 135–145)

## 2022-06-11 ENCOUNTER — Emergency Department
Admission: EM | Admit: 2022-06-11 | Discharge: 2022-06-11 | Discharge status: Against Medical Advice | Payer: Medicare Other | Attending: Emergency Medicine | Admitting: Emergency Medicine

## 2022-06-11 ENCOUNTER — Telehealth: Payer: Self-pay | Admitting: Physician Assistant

## 2022-06-11 ENCOUNTER — Encounter: Payer: Self-pay | Admitting: *Deleted

## 2022-06-11 ENCOUNTER — Telehealth (HOSPITAL_COMMUNITY): Payer: Self-pay | Admitting: Cardiology

## 2022-06-11 ENCOUNTER — Other Ambulatory Visit: Payer: Self-pay

## 2022-06-11 DIAGNOSIS — I5022 Chronic systolic (congestive) heart failure: Secondary | ICD-10-CM

## 2022-06-11 DIAGNOSIS — R339 Retention of urine, unspecified: Secondary | ICD-10-CM | POA: Insufficient documentation

## 2022-06-11 DIAGNOSIS — R319 Hematuria, unspecified: Secondary | ICD-10-CM | POA: Diagnosis not present

## 2022-06-11 DIAGNOSIS — Z5321 Procedure and treatment not carried out due to patient leaving prior to being seen by health care provider: Secondary | ICD-10-CM | POA: Insufficient documentation

## 2022-06-11 LAB — URINALYSIS, ROUTINE W REFLEX MICROSCOPIC
Bacteria, UA: NONE SEEN
RBC / HPF: >50 RBC/hpf — ABNORMAL HIGH (ref 0–5)
Specific Gravity, Urine: 1.018 (ref 1.005–1.030)
Squamous Epithelial / HPF: NONE SEEN (ref 0–5)
WBC, UA: 0 WBC/hpf (ref 0–5)

## 2022-06-11 LAB — COMPREHENSIVE METABOLIC PANEL
ALT: 26 U/L (ref 0–44)
AST: 27 U/L (ref 15–41)
Albumin: 4.2 g/dL (ref 3.5–5.0)
Alkaline Phosphatase: 51 U/L (ref 38–126)
Anion gap: 9 (ref 5–15)
BUN: 27 mg/dL — ABNORMAL HIGH (ref 8–23)
CO2: 21 mmol/L — ABNORMAL LOW (ref 22–32)
Calcium: 9 mg/dL (ref 8.9–10.3)
Chloride: 108 mmol/L (ref 98–111)
Creatinine, Ser: 1.8 mg/dL — ABNORMAL HIGH (ref 0.61–1.24)
GFR, Estimated: 37 mL/min — ABNORMAL LOW (ref >60)
Glucose, Bld: 148 mg/dL — ABNORMAL HIGH (ref 70–99)
Potassium: 4.5 mmol/L (ref 3.5–5.1)
Sodium: 138 mmol/L (ref 135–145)
Total Bilirubin: 0.9 mg/dL (ref 0.3–1.2)
Total Protein: 7.2 g/dL (ref 6.5–8.1)

## 2022-06-11 LAB — CBC
HCT: 38.9 % — ABNORMAL LOW (ref 39.0–52.0)
Hemoglobin: 12.8 g/dL — ABNORMAL LOW (ref 13.0–17.0)
MCH: 30.5 pg (ref 26.0–34.0)
MCHC: 32.9 g/dL (ref 30.0–36.0)
MCV: 92.6 fL (ref 80.0–100.0)
Platelets: 149 10*3/uL — ABNORMAL LOW (ref 150–400)
RBC: 4.2 MIL/uL — ABNORMAL LOW (ref 4.22–5.81)
RDW: 15.2 % (ref 11.5–15.5)
WBC: 6.1 10*3/uL (ref 4.0–10.5)
nRBC: 0 % (ref 0.0–0.2)

## 2022-06-11 NOTE — Telephone Encounter (Signed)
Patient called.  Patient aware. Repeat labs 11/6

## 2022-06-11 NOTE — Telephone Encounter (Signed)
-----   Message from Joette Catching, [place] sent at 06/10/2022  5:03 PM EDT ----- Kidney function better but potassium elevated. Limit potassium rich foods in diet. Recheck BMET either 11/2 or 11/3. If repeat K is elevated, will need to stop spiro.

## 2022-06-11 NOTE — ED Triage Notes (Signed)
Pt saw urologist today.  Pt unable to void today.  However, pt voided blood and clots in the lobby.  Pt alert.

## 2022-06-11 NOTE — Telephone Encounter (Signed)
Paged by the patient's caretaker Cleon Dew.  Patient went to his urologist office in [place] and underwent scope today.  It sounds like he underwent urethroscopy.  Postprocedure, he has been peeing blood.  He has noticed a lot of blood clot coming out since this afternoon.  He currently feel he is bladder is distended and it feels full, however he cannot make any more urine.  I suspect he has obstruction from the clots.  I asked him to check with his urologist office after hour answering service, however I suspect the patient will need to come to the emergency room to get further evaluated by urology team and potentially has a Foley placed.

## 2022-06-15 ENCOUNTER — Other Ambulatory Visit (HOSPITAL_COMMUNITY): Payer: Medicare Other

## 2022-06-17 ENCOUNTER — Ambulatory Visit (HOSPITAL_COMMUNITY)
Admission: RE | Admit: 2022-06-17 | Discharge: 2022-06-17 | Disposition: A | Discharge status: Home / Self Care | Payer: Medicare Other | Source: Ambulatory Visit | Attending: Cardiology | Admitting: Cardiology

## 2022-06-17 ENCOUNTER — Encounter: Payer: Self-pay | Admitting: *Deleted

## 2022-06-17 ENCOUNTER — Encounter: Payer: Medicare Other | Attending: Cardiology | Admitting: *Deleted

## 2022-06-17 DIAGNOSIS — I255 Ischemic cardiomyopathy: Secondary | ICD-10-CM | POA: Insufficient documentation

## 2022-06-17 DIAGNOSIS — I5022 Chronic systolic (congestive) heart failure: Secondary | ICD-10-CM | POA: Diagnosis present

## 2022-06-17 DIAGNOSIS — I4819 Other persistent atrial fibrillation: Secondary | ICD-10-CM | POA: Insufficient documentation

## 2022-06-17 LAB — BASIC METABOLIC PANEL
Anion gap: 11 (ref 5–15)
BUN: 34 mg/dL — ABNORMAL HIGH (ref 8–23)
CO2: 23 mmol/L (ref 22–32)
Calcium: 9.5 mg/dL (ref 8.9–10.3)
Chloride: 103 mmol/L (ref 98–111)
Creatinine, Ser: 1.93 mg/dL — ABNORMAL HIGH (ref 0.61–1.24)
GFR, Estimated: 34 mL/min — ABNORMAL LOW (ref >60)
Glucose, Bld: 159 mg/dL — ABNORMAL HIGH (ref 70–99)
Potassium: 4.6 mmol/L (ref 3.5–5.1)
Sodium: 137 mmol/L (ref 135–145)

## 2022-06-17 NOTE — Progress Notes (Signed)
Virtual orientation call completed today. he has an appointment on Date: 06/25/2022  for EP eval and gym Orientation.  Documentation of diagnosis can be found in [facility] Date: 05/28/2022 .

## 2022-06-23 ENCOUNTER — Other Ambulatory Visit: Payer: Self-pay | Admitting: Nurse Practitioner

## 2022-06-23 ENCOUNTER — Ambulatory Visit: Payer: Medicare Other | Admitting: Cardiology

## 2022-06-25 VITALS — Ht 68.5 in | Wt 169.4 lb

## 2022-06-25 DIAGNOSIS — I5022 Chronic systolic (congestive) heart failure: Secondary | ICD-10-CM | POA: Diagnosis present

## 2022-06-25 DIAGNOSIS — I255 Ischemic cardiomyopathy: Secondary | ICD-10-CM | POA: Diagnosis not present

## 2022-06-25 DIAGNOSIS — I4819 Other persistent atrial fibrillation: Secondary | ICD-10-CM | POA: Diagnosis not present

## 2022-06-25 NOTE — Patient Instructions (Signed)
Patient Instructions  Patient Details  Name: Antonio Clark MRN: [id number] Date of Birth: 06-Sep-1935 Referring Provider:  Larey Dresser, MD  Below are your personal goals for exercise, nutrition, and risk factors. Our goal is to help you stay on track towards obtaining and maintaining these goals. We will be discussing your progress on these goals with you throughout the program.  Initial Exercise Prescription:  Initial Exercise Prescription - 06/25/22 1600       Date of Initial Exercise RX and Referring Provider   Date 06/25/22    Referring Provider Loralie Champagne MD      Recumbant Bike   Level 1    RPM 60    Watts 18    Minutes 15    METs 1.6      NuStep   Level 1    SPM 80    Minutes 15    METs 1.6      T5 Nustep   Level 1    SPM 80    Minutes 15    METs 1.6      Track   Laps 20    Minutes 15    METs 2.09      Prescription Details   Frequency (times per week) 2    Duration Progress to 30 minutes of continuous aerobic without signs/symptoms of physical distress      Intensity   THRR 40-80% of Max Heartrate 104 - 125    Ratings of Perceived Exertion 11-13    Perceived Dyspnea 0-4      Progression   Progression Continue to progress workloads to maintain intensity without signs/symptoms of physical distress.      Resistance Training   Training Prescription Yes    Weight 3 lb    Reps 10-15             Exercise Goals: Frequency: Be able to perform aerobic exercise two to three times per week in program working toward 2-5 days per week of home exercise.  Intensity: Work with a perceived exertion of 11 (fairly light) - 15 (hard) while following your exercise prescription.  We will make changes to your prescription with you as you progress through the program.   Duration: Be able to do 30 to 45 minutes of continuous aerobic exercise in addition to a 5 minute warm-up and a 5 minute cool-down routine.   Nutrition Goals: Your personal nutrition goals  will be established when you do your nutrition analysis with the dietician.  The following are general nutrition guidelines to follow: Cholesterol < '200mg'$ /day Sodium < '1500mg'$ /day Fiber: Men over 50 yrs - 30 grams per day  Personal Goals:  Personal Goals and Risk Factors at Admission - 06/25/22 1621       Core Components/Risk Factors/Patient Goals on Admission    Weight Management Yes;Weight Maintenance    Intervention Weight Management: Develop a combined nutrition and exercise program designed to reach desired caloric intake, while maintaining appropriate intake of nutrient and fiber, sodium and fats, and appropriate energy expenditure required for the weight goal.;Weight Management: Provide education and appropriate resources to help participant work on and attain dietary goals.    Admit Weight 169 lb (76.7 kg)    Goal Weight: Short Term 169 lb (76.7 kg)    Goal Weight: Long Term 169 lb (76.7 kg)    Expected Outcomes Short Term: Continue to assess and modify interventions until short term weight is achieved;Long Term: Adherence to nutrition and physical activity/exercise program aimed  toward attainment of established weight goal;Weight Maintenance: Understanding of the daily nutrition guidelines, which includes 25-35% calories from fat, 7% or less cal from saturated fats, less than '200mg'$  cholesterol, less than 1.5gm of sodium, & 5 or more servings of fruits and vegetables daily;Understanding recommendations for meals to include 15-35% energy as protein, 25-35% energy from fat, 35-60% energy from carbohydrates, less than '200mg'$  of dietary cholesterol, 20-35 gm of total fiber daily;Understanding of distribution of calorie intake throughout the day with the consumption of 4-5 meals/snacks    Diabetes Yes    Intervention Provide education about signs/symptoms and action to take for hypo/hyperglycemia.;Provide education about proper nutrition, including hydration, and aerobic/resistive exercise  prescription along with prescribed medications to achieve blood glucose in normal ranges: Fasting glucose 65-99 mg/dL    Expected Outcomes Short Term: Participant verbalizes understanding of the signs/symptoms and immediate care of hyper/hypoglycemia, proper foot care and importance of medication, aerobic/resistive exercise and nutrition plan for blood glucose control.;Long Term: Attainment of HbA1C < 7%.    Heart Failure Yes    Intervention Provide a combined exercise and nutrition program that is supplemented with education, support and counseling about heart failure. Directed toward relieving symptoms such as shortness of breath, decreased exercise tolerance, and extremity edema.    Expected Outcomes Improve functional capacity of life;Short term: Attendance in program 2-3 days a week with increased exercise capacity. Reported lower sodium intake. Reported increased fruit and vegetable intake. Reports medication compliance.;Short term: Daily weights obtained and reported for increase. Utilizing diuretic protocols set by physician.;Long term: Adoption of self-care skills and reduction of barriers for early signs and symptoms recognition and intervention leading to self-care maintenance.    Hypertension Yes    Intervention Provide education on lifestyle modifcations including regular physical activity/exercise, weight management, moderate sodium restriction and increased consumption of fresh fruit, vegetables, and low fat dairy, alcohol moderation, and smoking cessation.;Monitor prescription use compliance.    Expected Outcomes Short Term: Continued assessment and intervention until BP is < 140/38m HG in hypertensive participants. < 130/843mHG in hypertensive participants with diabetes, heart failure or chronic kidney disease.;Long Term: Maintenance of blood pressure at goal levels.    Lipids Yes    Intervention Provide education and support for participant on nutrition & aerobic/resistive exercise along  with prescribed medications to achieve LDL '70mg'$ , HDL >'40mg'$ .    Expected Outcomes Short Term: Participant states understanding of desired cholesterol values and is compliant with medications prescribed. Participant is following exercise prescription and nutrition guidelines.;Long Term: Cholesterol controlled with medications as prescribed, with individualized exercise RX and with personalized nutrition plan. Value goals: LDL < '70mg'$ , HDL > 40 mg.             Tobacco Use Initial Evaluation: Social History   Tobacco Use  Smoking Status Never  Smokeless Tobacco Never    Exercise Goals and Review:  Exercise Goals     Row Name 06/25/22 1621             Exercise Goals   Increase Physical Activity Yes       Intervention Provide advice, education, support and counseling about physical activity/exercise needs.;Develop an individualized exercise prescription for aerobic and resistive training based on initial evaluation findings, risk stratification, comorbidities and participant's personal goals.       Expected Outcomes Short Term: Attend rehab on a regular basis to increase amount of physical activity.;Long Term: Add in home exercise to make exercise part of routine and to increase amount of physical activity.;Long  Term: Exercising regularly at least 3-5 days a week.       Increase Strength and Stamina Yes       Intervention Provide advice, education, support and counseling about physical activity/exercise needs.;Develop an individualized exercise prescription for aerobic and resistive training based on initial evaluation findings, risk stratification, comorbidities and participant's personal goals.       Expected Outcomes Short Term: Increase workloads from initial exercise prescription for resistance, speed, and METs.;Short Term: Perform resistance training exercises routinely during rehab and add in resistance training at home;Long Term: Improve cardiorespiratory fitness, muscular endurance  and strength as measured by increased METs and functional capacity (6MWT)       Able to understand and use rate of perceived exertion (RPE) scale Yes       Intervention Provide education and explanation on how to use RPE scale       Expected Outcomes Short Term: Able to use RPE daily in rehab to express subjective intensity level;Long Term:  Able to use RPE to guide intensity level when exercising independently       Able to understand and use Dyspnea scale Yes       Intervention Provide education and explanation on how to use Dyspnea scale       Expected Outcomes Short Term: Able to use Dyspnea scale daily in rehab to express subjective sense of shortness of breath during exertion;Long Term: Able to use Dyspnea scale to guide intensity level when exercising independently       Knowledge and understanding of Target Heart Rate Range (THRR) Yes       Intervention Provide education and explanation of THRR including how the numbers were predicted and where they are located for reference       Expected Outcomes Short Term: Able to state/look up THRR;Long Term: Able to use THRR to govern intensity when exercising independently;Short Term: Able to use daily as guideline for intensity in rehab       Able to check pulse independently Yes       Intervention Provide education and demonstration on how to check pulse in carotid and radial arteries.;Review the importance of being able to check your own pulse for safety during independent exercise       Expected Outcomes Long Term: Able to check pulse independently and accurately;Short Term: Able to explain why pulse checking is important during independent exercise       Understanding of Exercise Prescription Yes       Intervention Provide education, explanation, and written materials on patient's individual exercise prescription       Expected Outcomes Short Term: Able to explain program exercise prescription;Long Term: Able to explain home exercise prescription  to exercise independently                Copy of goals given to participant.

## 2022-06-25 NOTE — Progress Notes (Signed)
Cardiac Individual Treatment Plan  Patient Details  Name: Antonio Clark MRN: [id number] Date of Birth: 13-Dec-1935 Referring Provider:   Flowsheet Row Cardiac Rehab from 06/25/2022 in [facility] Cardiac and Pulmonary Rehab  Referring Provider Loralie Champagne MD       Initial Encounter Date:  Flowsheet Row Cardiac Rehab from 06/25/2022 in [facility] Cardiac and Pulmonary Rehab  Date 06/25/22       Visit Diagnosis: Systolic heart failure, chronic ([place])  Patient's Home Medications on Admission:  Current Outpatient Medications:    acetaminophen (TYLENOL) 325 MG tablet, Take 2 tablets (650 mg total) by mouth every 6 (six) hours as needed for mild pain (or Fever >/= 101)., Disp: , Rfl:    amiodarone (PACERONE) 200 MG tablet, Take 0.5 tablets (100 mg total) by mouth daily., Disp: 45 tablet, Rfl: 3   apixaban (ELIQUIS) 2.5 MG TABS tablet, Take 1 tablet (2.5 mg total) by mouth 2 (two) times daily., Disp: 180 tablet, Rfl: 1   atorvastatin (LIPITOR) 80 MG tablet, Take 1 tablet (80 mg total) by mouth daily., Disp: 90 tablet, Rfl: 3   empagliflozin (JARDIANCE) 10 MG TABS tablet, Take 1 tablet (10 mg total) by mouth daily before breakfast., Disp: 90 tablet, Rfl: 3   finasteride (PROSCAR) 5 MG tablet, Take 5 mg by mouth daily., Disp: , Rfl:    fluticasone (FLONASE) 50 MCG/ACT nasal spray, Place 1 spray into both nostrils daily as needed for allergies or rhinitis., Disp: , Rfl:    furosemide (LASIX) 40 MG tablet, Take 0.5 tablets (20 mg total) by mouth daily., Disp: 90 tablet, Rfl: 3   linagliptin (TRADJENTA) 5 MG TABS tablet, Take 1 tablet (5 mg total) by mouth daily., Disp: 90 tablet, Rfl: 3   LORazepam (ATIVAN) 0.5 MG tablet, TAKE 1 TABLET BY MOUTH 2 TIMES DAILY AS NEEDED FOR ANXIETY., Disp: 30 tablet, Rfl: 1   metoprolol succinate (TOPROL-XL) 50 MG 24 hr tablet, Take 0.5 tablets (25 mg total) by mouth at bedtime. Take with or immediately following a meal., Disp: 30 tablet, Rfl: 5   nitroGLYCERIN (NITROSTAT)  0.4 MG SL tablet, Place 1 tablet (0.4 mg total) under the tongue every 5 (five) minutes x 3 doses as needed for chest pain., Disp: 25 tablet, Rfl: 12   ondansetron (ZOFRAN) 4 MG tablet, Take 1 tablet (4 mg total) by mouth every 6 (six) hours as needed for nausea., Disp: 20 tablet, Rfl: 0   oxymetazoline (AFRIN NASAL SPRAY) 0.05 % nasal spray, Place 1 spray into both nostrils 2 (two) times daily., Disp: 30 mL, Rfl: 0   spironolactone (ALDACTONE) 25 MG tablet, Take 0.5 tablets (12.5 mg total) by mouth at bedtime., Disp: 45 tablet, Rfl: 3   tamsulosin (FLOMAX) 0.4 MG CAPS capsule, Take by mouth., Disp: , Rfl:    traZODone (DESYREL) 50 MG tablet, TAKE 0.5 TABLETS BY MOUTH AT BEDTIME AS NEEDED FOR SLEEP., Disp: 45 tablet, Rfl: 3  Past Medical History: Past Medical History:  Diagnosis Date   Anal fissure    Arthritis    Atrial fibrillation (HCC)    Back pain    CHF (congestive heart failure) (HCC)    Colon polyps    Coronary artery disease    Diabetes mellitus type 2 with complications (HCC)    Dysrhythmia    a-fib   GERD (gastroesophageal reflux disease)    History of echocardiogram 07/ 07/ 2011   History of lithotripsy 1989   Hyperlipidemia    Hypertension    Kidney stones  Melanoma ([place]) 05/01/2019   right chest wall (12/20)   Prostate CA ([place]) 2010   Sleep apnea    uses CPAP nightly   Squamous cell carcinoma of skin 05/23/1992   bowens-left parietal scalp ([id number])   Squamous cell carcinoma of skin 03/14/2008   in situ-left upper outer forehead-medial ([id number])   Squamous cell carcinoma of skin 03/14/2008   in situ-crown of scalp ([id number])   Squamous cell carcinoma of skin 04/15/2011   in situ-right dorsal forearm (txpbx)   Squamous cell carcinoma of skin 10/16/2011   in situ-left sideburn   Squamous cell carcinoma of skin 03/11/2015   ka-left sideburn ([id number])   Squamous cell carcinoma of skin 03/11/2015   ka-left forearm ([id number])   Squamous cell carcinoma of skin  06/22/2018   in situ-left forearm, sup (txpbx)   Squamous cell carcinoma of skin 05/01/2019   in situ-mid anterior scalp    Squamous cell carcinoma of skin 05/01/2019   in situ-right upper arm    Tobacco Use: Social History   Tobacco Use  Smoking Status Never  Smokeless Tobacco Never    Labs: Review Flowsheet  More data exists      Latest Ref Rng & Units 12/17/2020 05/05/2021 10/30/2021 02/09/2022 05/28/2022  Labs for ITP Cardiac and Pulmonary Rehab  Cholestrol 0 - 200 mg/dL 112  108  103  - 142   LDL (calc) 0 - 99 mg/dL 51  45  38  - 54   HDL-C >40 mg/dL 37  43  48  - 55   Trlycerides <150 mg/dL 158  116  83  - 167   Hemoglobin A1c 4.8 - 5.6 % 6.7  6.6  6.3  6.4  -     Exercise Target Goals: Exercise Program Goal: Individual exercise prescription set using results from initial 6 min walk test and THRR while considering  patient's activity barriers and safety.   Exercise Prescription Goal: Initial exercise prescription builds to 30-45 minutes a day of aerobic activity, 2-3 days per week.  Home exercise guidelines will be given to patient during program as part of exercise prescription that the participant will acknowledge.   Education: Aerobic Exercise: - Group verbal and visual presentation on the components of exercise prescription. Introduces F.I.T.T principle from ACSM for exercise prescriptions.  Reviews F.I.T.T. principles of aerobic exercise including progression. Written material given at graduation.   Education: Resistance Exercise: - Group verbal and visual presentation on the components of exercise prescription. Introduces F.I.T.T principle from ACSM for exercise prescriptions  Reviews F.I.T.T. principles of resistance exercise including progression. Written material given at graduation.    Education: Exercise & Equipment Safety: - Individual verbal instruction and demonstration of equipment use and safety with use of the equipment. Flowsheet Row Cardiac Rehab  from 06/25/2022 in [facility] Cardiac and Pulmonary Rehab  Education need identified 06/25/22  Date 06/25/22  Educator [place]  Instruction Review Code 1- Verbalizes Understanding       Education: Exercise Physiology & General Exercise Guidelines: - Group verbal and written instruction with models to review the exercise physiology of the cardiovascular system and associated critical values. Provides general exercise guidelines with specific guidelines to those with heart or lung disease.  Flowsheet Row Cardiac Rehab from 06/25/2022 in [facility] Cardiac and Pulmonary Rehab  Education need identified 06/25/22       Education: Flexibility, Balance, Mind/Body Relaxation: - Group verbal and visual presentation with interactive activity on the components of exercise prescription. Introduces F.I.T.T principle from ACSM for exercise  prescriptions. Reviews F.I.T.T. principles of flexibility and balance exercise training including progression. Also discusses the mind body connection.  Reviews various relaxation techniques to help reduce and manage stress (i.e. Deep breathing, progressive muscle relaxation, and visualization). Balance handout provided to take home. Written material given at graduation.   Activity Barriers & Risk Stratification:  Activity Barriers & Cardiac Risk Stratification - 06/25/22 1612       Activity Barriers & Cardiac Risk Stratification   Activity Barriers Balance Concerns;Back Problems;Arthritis;Assistive Device;Deconditioning   walker/cane as needed but has not used in a while   Cardiac Risk Stratification High             6 Minute Walk:  6 Minute Walk     Row Name 06/25/22 1613         6 Minute Walk   Phase Initial     Distance 985 feet     Walk Time 6 minutes     # of Rest Breaks 0     MPH 1.86     METS 1.67     RPE 12     Perceived Dyspnea  0     VO2 Peak 5.85     Symptoms No     Resting HR 84 bpm     Resting BP 120/66     Resting Oxygen Saturation  99 %      Exercise Oxygen Saturation  during 6 min walk 97 %     Max Ex. HR 94 bpm     Max Ex. BP 130/62     2 Minute Post BP 122/68              Oxygen Initial Assessment:   Oxygen Re-Evaluation:   Oxygen Discharge (Final Oxygen Re-Evaluation):   Initial Exercise Prescription:  Initial Exercise Prescription - 06/25/22 1600       Date of Initial Exercise RX and Referring Provider   Date 06/25/22    Referring Provider Loralie Champagne MD      Recumbant Bike   Level 1    RPM 60    Watts 18    Minutes 15    METs 1.6      NuStep   Level 1    SPM 80    Minutes 15    METs 1.6      T5 Nustep   Level 1    SPM 80    Minutes 15    METs 1.6      Track   Laps 20    Minutes 15    METs 2.09      Prescription Details   Frequency (times per week) 2    Duration Progress to 30 minutes of continuous aerobic without signs/symptoms of physical distress      Intensity   THRR 40-80% of Max Heartrate 104 - 125    Ratings of Perceived Exertion 11-13    Perceived Dyspnea 0-4      Progression   Progression Continue to progress workloads to maintain intensity without signs/symptoms of physical distress.      Resistance Training   Training Prescription Yes    Weight 3 lb    Reps 10-15             Perform Capillary Blood Glucose checks as needed.  Exercise Prescription Changes:   Exercise Prescription Changes     Row Name 06/25/22 1600             Response to Exercise   Blood Pressure (  Admit) 120/66       Blood Pressure (Exercise) 130/62       Blood Pressure (Exit) 122/68       Heart Rate (Admit) 84 bpm       Heart Rate (Exercise) 94 bpm       Heart Rate (Exit) 79 bpm       Oxygen Saturation (Admit) 99 %       Oxygen Saturation (Exercise) 97 %       Oxygen Saturation (Exit) 99 %       Rating of Perceived Exertion (Exercise) 12       Perceived Dyspnea (Exercise) 0       Symptoms none       Comments walk test results                Exercise  Comments:   Exercise Goals and Review:   Exercise Goals     Row Name 06/25/22 1621             Exercise Goals   Increase Physical Activity Yes       Intervention Provide advice, education, support and counseling about physical activity/exercise needs.;Develop an individualized exercise prescription for aerobic and resistive training based on initial evaluation findings, risk stratification, comorbidities and participant's personal goals.       Expected Outcomes Short Term: Attend rehab on a regular basis to increase amount of physical activity.;Long Term: Add in home exercise to make exercise part of routine and to increase amount of physical activity.;Long Term: Exercising regularly at least 3-5 days a week.       Increase Strength and Stamina Yes       Intervention Provide advice, education, support and counseling about physical activity/exercise needs.;Develop an individualized exercise prescription for aerobic and resistive training based on initial evaluation findings, risk stratification, comorbidities and participant's personal goals.       Expected Outcomes Short Term: Increase workloads from initial exercise prescription for resistance, speed, and METs.;Short Term: Perform resistance training exercises routinely during rehab and add in resistance training at home;Long Term: Improve cardiorespiratory fitness, muscular endurance and strength as measured by increased METs and functional capacity (6MWT)       Able to understand and use rate of perceived exertion (RPE) scale Yes       Intervention Provide education and explanation on how to use RPE scale       Expected Outcomes Short Term: Able to use RPE daily in rehab to express subjective intensity level;Long Term:  Able to use RPE to guide intensity level when exercising independently       Able to understand and use Dyspnea scale Yes       Intervention Provide education and explanation on how to use Dyspnea scale       Expected  Outcomes Short Term: Able to use Dyspnea scale daily in rehab to express subjective sense of shortness of breath during exertion;Long Term: Able to use Dyspnea scale to guide intensity level when exercising independently       Knowledge and understanding of Target Heart Rate Range (THRR) Yes       Intervention Provide education and explanation of THRR including how the numbers were predicted and where they are located for reference       Expected Outcomes Short Term: Able to state/look up THRR;Long Term: Able to use THRR to govern intensity when exercising independently;Short Term: Able to use daily as guideline for intensity in rehab  Able to check pulse independently Yes       Intervention Provide education and demonstration on how to check pulse in carotid and radial arteries.;Review the importance of being able to check your own pulse for safety during independent exercise       Expected Outcomes Long Term: Able to check pulse independently and accurately;Short Term: Able to explain why pulse checking is important during independent exercise       Understanding of Exercise Prescription Yes       Intervention Provide education, explanation, and written materials on patient's individual exercise prescription       Expected Outcomes Short Term: Able to explain program exercise prescription;Long Term: Able to explain home exercise prescription to exercise independently                Exercise Goals Re-Evaluation :   Discharge Exercise Prescription (Final Exercise Prescription Changes):  Exercise Prescription Changes - 06/25/22 1600       Response to Exercise   Blood Pressure (Admit) 120/66    Blood Pressure (Exercise) 130/62    Blood Pressure (Exit) 122/68    Heart Rate (Admit) 84 bpm    Heart Rate (Exercise) 94 bpm    Heart Rate (Exit) 79 bpm    Oxygen Saturation (Admit) 99 %    Oxygen Saturation (Exercise) 97 %    Oxygen Saturation (Exit) 99 %    Rating of Perceived Exertion  (Exercise) 12    Perceived Dyspnea (Exercise) 0    Symptoms none    Comments walk test results             Nutrition:  Target Goals: Understanding of nutrition guidelines, daily intake of sodium '1500mg'$ , cholesterol '200mg'$ , calories 30% from fat and 7% or less from saturated fats, daily to have 5 or more servings of fruits and vegetables.  Education: All About Nutrition: -Group instruction provided by verbal, written material, interactive activities, discussions, models, and posters to present general guidelines for heart healthy nutrition including fat, fiber, MyPlate, the role of sodium in heart healthy nutrition, utilization of the nutrition label, and utilization of this knowledge for meal planning. Follow up email sent as well. Written material given at graduation.   Biometrics:  Pre Biometrics - 06/25/22 1612       Pre Biometrics   Height 5' 8.5" (1.74 m)    Weight 169 lb 6.4 oz (76.8 kg)    Waist Circumference 41 inches    Hip Circumference 39.5 inches    Waist to Hip Ratio 1.04 %    BMI (Calculated) 25.38    Single Leg Stand 3.5 seconds              Nutrition Therapy Plan and Nutrition Goals:  Nutrition Therapy & Goals - 06/25/22 1604       Intervention Plan   Intervention Prescribe, educate and counsel regarding individualized specific dietary modifications aiming towards targeted core components such as weight, hypertension, lipid management, diabetes, heart failure and other comorbidities.    Expected Outcomes Short Term Goal: Understand basic principles of dietary content, such as calories, fat, sodium, cholesterol and nutrients.;Short Term Goal: A plan has been developed with personal nutrition goals set during dietitian appointment.;Long Term Goal: Adherence to prescribed nutrition plan.             Nutrition Assessments:  MEDIFICTS Score Key: ?70 Need to make dietary changes  40-70 Heart Healthy Diet ? 40 Therapeutic Level Cholesterol  Diet  Flowsheet Row Cardiac Rehab from 06/25/2022  in [facility] Cardiac and Pulmonary Rehab  Picture Your Plate Total Score on Admission 66      Picture Your Plate Scores: <57 Unhealthy dietary pattern with much room for improvement. 41-50 Dietary pattern unlikely to meet recommendations for good health and room for improvement. 51-60 More healthful dietary pattern, with some room for improvement.  >60 Healthy dietary pattern, although there may be some specific behaviors that could be improved.    Nutrition Goals Re-Evaluation:   Nutrition Goals Discharge (Final Nutrition Goals Re-Evaluation):   Psychosocial: Target Goals: Acknowledge presence or absence of significant depression and/or stress, maximize coping skills, provide positive support system. Participant is able to verbalize types and ability to use techniques and skills needed for reducing stress and depression.   Education: Stress, Anxiety, and Depression - Group verbal and visual presentation to define topics covered.  Reviews how body is impacted by stress, anxiety, and depression.  Also discusses healthy ways to reduce stress and to treat/manage anxiety and depression.  Written material given at graduation.   Education: Sleep Hygiene -Provides group verbal and written instruction about how sleep can affect your health.  Define sleep hygiene, discuss sleep cycles and impact of sleep habits. Review good sleep hygiene tips.    Initial Review & Psychosocial Screening:  Initial Psych Review & Screening - 06/17/22 [place]? Yes   Joelene Millin his care giver, no immediate family     Barriers   Psychosocial barriers to participate in program There are no identifiable barriers or psychosocial needs.      Screening Interventions   Interventions Encouraged to exercise;To provide support and resources with identified psychosocial needs;Provide feedback about the scores to participant     Expected Outcomes Short Term goal: Utilizing psychosocial counselor, staff and physician to assist with identification of specific Stressors or current issues interfering with healing process. Setting desired goal for each stressor or current issue identified.;Long Term Goal: Stressors or current issues are controlled or eliminated.;Short Term goal: Identification and review with participant of any Quality of Life or Depression concerns found by scoring the questionnaire.;Long Term goal: The participant improves quality of Life and PHQ9 Scores as seen by post scores and/or verbalization of changes             Quality of Life Scores:   Quality of Life - 06/25/22 1609       Quality of Life   Select Quality of Life      Quality of Life Scores   Health/Function Pre 6.86 %    Socioeconomic Pre 30 %    Psych/Spiritual Pre 17 %    Family Pre 14 %    GLOBAL Pre 13.93 %            Scores of 19 and below usually indicate a poorer quality of life in these areas.  A difference of  2-3 points is a clinically meaningful difference.  A difference of 2-3 points in the total score of the Quality of Life Index has been associated with significant improvement in overall quality of life, self-image, physical symptoms, and general health in studies assessing change in quality of life.  PHQ-9: Review Flowsheet  More data exists      06/25/2022 02/27/2022 01/20/2022 12/29/2021 10/30/2021  Depression screen PHQ 2/9  Decreased Interest 1 0 0 0 0  Down, Depressed, Hopeless 1 0 0 0 0  PHQ - 2 Score 2 0 0 0 0  Altered sleeping 3 - - - -  Tired, decreased energy 3 - - - -  Change in appetite 0 - - - -  Feeling bad or failure about yourself  1 - - - -  Trouble concentrating 0 - - - -  Moving slowly or fidgety/restless 0 - - - -  Suicidal thoughts 1 - - - -  PHQ-9 Score 10 - - - -  Difficult doing work/chores Very difficult - - - -   Interpretation of Total Score  Total Score Depression Severity:  1-4  = Minimal depression, 5-9 = Mild depression, 10-14 = Moderate depression, 15-19 = Moderately severe depression, 20-27 = Severe depression   Psychosocial Evaluation and Intervention:  Psychosocial Evaluation - 06/17/22 1602       Psychosocial Evaluation & Interventions   Interventions Encouraged to exercise with the program and follow exercise prescription    Comments Treshun has no barriers to attending the program. He is ready to start exercising as he has not done much in the past year. He lives alone. He has support from his caregiver of 8 years,Kimberly, and distant relatives. He is attending for a diagnosis of Heart failure and he does weigh himself every day and watches for daily weight gain over 2 pounds. He knows to call his doctor if that gain occurs. He should do well with the program.    Expected Outcomes STG Keishon attends all scheduled sessions, he returns to a routine exercise program. LTG Lynann Bologna after discharge will continue with his exercise progression.    Continue Psychosocial Services  Follow up required by staff             Psychosocial Re-Evaluation:   Psychosocial Discharge (Final Psychosocial Re-Evaluation):   Vocational Rehabilitation: Provide vocational rehab assistance to qualifying candidates.   Vocational Rehab Evaluation & Intervention:   Education: Education Goals: Education classes will be provided on a variety of topics geared toward better understanding of heart health and risk factor modification. Participant will state understanding/return demonstration of topics presented as noted by education test scores.  Learning Barriers/Preferences:   General Cardiac Education Topics:  AED/CPR: - Group verbal and written instruction with the use of models to demonstrate the basic use of the AED with the basic ABC's of resuscitation.   Anatomy and Cardiac Procedures: - Group verbal and visual presentation and models provide information about basic cardiac  anatomy and function. Reviews the testing methods done to diagnose heart disease and the outcomes of the test results. Describes the treatment choices: Medical Management, Angioplasty, or Coronary Bypass Surgery for treating various heart conditions including Myocardial Infarction, Angina, Valve Disease, and Cardiac Arrhythmias.  Written material given at graduation. Flowsheet Row Cardiac Rehab from 06/25/2022 in [facility] Cardiac and Pulmonary Rehab  Education need identified 06/25/22       Medication Safety: - Group verbal and visual instruction to review commonly prescribed medications for heart and lung disease. Reviews the medication, class of the drug, and side effects. Includes the steps to properly store meds and maintain the prescription regimen.  Written material given at graduation.   Intimacy: - Group verbal instruction through game format to discuss how heart and lung disease can affect sexual intimacy. Written material given at graduation..   Know Your Numbers and Heart Failure: - Group verbal and visual instruction to discuss disease risk factors for cardiac and pulmonary disease and treatment options.  Reviews associated critical values for Overweight/Obesity, Hypertension, Cholesterol, and Diabetes.  Discusses basics of heart failure:  signs/symptoms and treatments.  Introduces Heart Failure Zone chart for action plan for heart failure.  Written material given at graduation.   Infection Prevention: - Provides verbal and written material to individual with discussion of infection control including proper hand washing and proper equipment cleaning during exercise session. Flowsheet Row Cardiac Rehab from 06/25/2022 in [facility] Cardiac and Pulmonary Rehab  Education need identified 06/25/22  Date 06/25/22  Educator [place]  Instruction Review Code 1- Verbalizes Understanding       Falls Prevention: - Provides verbal and written material to individual with discussion of falls prevention  and safety. Flowsheet Row Cardiac Rehab from 06/17/2022 in [facility] Cardiac and Pulmonary Rehab  Date 06/17/22  Educator [place]  Instruction Review Code 1- Verbalizes Understanding       Other: -Provides group and verbal instruction on various topics (see comments)   Knowledge Questionnaire Score:  Knowledge Questionnaire Score - 06/25/22 1603       Knowledge Questionnaire Score   Pre Score 24/26             Core Components/Risk Factors/Patient Goals at Admission:  Personal Goals and Risk Factors at Admission - 06/25/22 1621       Core Components/Risk Factors/Patient Goals on Admission    Weight Management Yes;Weight Maintenance    Intervention Weight Management: Develop a combined nutrition and exercise program designed to reach desired caloric intake, while maintaining appropriate intake of nutrient and fiber, sodium and fats, and appropriate energy expenditure required for the weight goal.;Weight Management: Provide education and appropriate resources to help participant work on and attain dietary goals.    Admit Weight 169 lb (76.7 kg)    Goal Weight: Short Term 169 lb (76.7 kg)    Goal Weight: Long Term 169 lb (76.7 kg)    Expected Outcomes Short Term: Continue to assess and modify interventions until short term weight is achieved;Long Term: Adherence to nutrition and physical activity/exercise program aimed toward attainment of established weight goal;Weight Maintenance: Understanding of the daily nutrition guidelines, which includes 25-35% calories from fat, 7% or less cal from saturated fats, less than '200mg'$  cholesterol, less than 1.5gm of sodium, & 5 or more servings of fruits and vegetables daily;Understanding recommendations for meals to include 15-35% energy as protein, 25-35% energy from fat, 35-60% energy from carbohydrates, less than '200mg'$  of dietary cholesterol, 20-35 gm of total fiber daily;Understanding of distribution of calorie intake throughout the day with the  consumption of 4-5 meals/snacks    Diabetes Yes    Intervention Provide education about signs/symptoms and action to take for hypo/hyperglycemia.;Provide education about proper nutrition, including hydration, and aerobic/resistive exercise prescription along with prescribed medications to achieve blood glucose in normal ranges: Fasting glucose 65-99 mg/dL    Expected Outcomes Short Term: Participant verbalizes understanding of the signs/symptoms and immediate care of hyper/hypoglycemia, proper foot care and importance of medication, aerobic/resistive exercise and nutrition plan for blood glucose control.;Long Term: Attainment of HbA1C < 7%.    Heart Failure Yes    Intervention Provide a combined exercise and nutrition program that is supplemented with education, support and counseling about heart failure. Directed toward relieving symptoms such as shortness of breath, decreased exercise tolerance, and extremity edema.    Expected Outcomes Improve functional capacity of life;Short term: Attendance in program 2-3 days a week with increased exercise capacity. Reported lower sodium intake. Reported increased fruit and vegetable intake. Reports medication compliance.;Short term: Daily weights obtained and reported for increase. Utilizing diuretic protocols set by physician.;Long term: Adoption of  self-care skills and reduction of barriers for early signs and symptoms recognition and intervention leading to self-care maintenance.    Hypertension Yes    Intervention Provide education on lifestyle modifcations including regular physical activity/exercise, weight management, moderate sodium restriction and increased consumption of fresh fruit, vegetables, and low fat dairy, alcohol moderation, and smoking cessation.;Monitor prescription use compliance.    Expected Outcomes Short Term: Continued assessment and intervention until BP is < 140/41m HG in hypertensive participants. < 130/862mHG in hypertensive  participants with diabetes, heart failure or chronic kidney disease.;Long Term: Maintenance of blood pressure at goal levels.    Lipids Yes    Intervention Provide education and support for participant on nutrition & aerobic/resistive exercise along with prescribed medications to achieve LDL '70mg'$ , HDL >'40mg'$ .    Expected Outcomes Short Term: Participant states understanding of desired cholesterol values and is compliant with medications prescribed. Participant is following exercise prescription and nutrition guidelines.;Long Term: Cholesterol controlled with medications as prescribed, with individualized exercise RX and with personalized nutrition plan. Value goals: LDL < '70mg'$ , HDL > 40 mg.             Education:Diabetes - Individual verbal and written instruction to review signs/symptoms of diabetes, desired ranges of glucose level fasting, after meals and with exercise. Acknowledge that pre and post exercise glucose checks will be done for 3 sessions at entry of program. [facility]  Education need identified 06/25/22  Date 06/25/22  Educator [place]ruction Review Code 1- Verbalizes Understanding       Core Components/Risk Factors/Patient Goals Review:    Core Components/Risk Factors/Patient Goals at Discharge (Final Review):    ITP Comments:  ITP Comments     Row Name 06/17/22 1608 06/25/22 1602         ITP Comments Virtual orientation call completed today. he has an appointment on Date: 06/25/2022  for EP eval and gym Orientation.  [facility] . Completed 6MWT and gym orientation. Initial ITP created and sent for review to Dr. MaEmily FilbertMedical Director.               Comments: Initial ITP

## 2022-06-30 ENCOUNTER — Encounter: Payer: Medicare Other | Admitting: *Deleted

## 2022-06-30 DIAGNOSIS — I5022 Chronic systolic (congestive) heart failure: Secondary | ICD-10-CM

## 2022-06-30 LAB — GLUCOSE, CAPILLARY
Glucose-Capillary: 135 mg/dL — ABNORMAL HIGH (ref 70–99)
Glucose-Capillary: 112 mg/dL — ABNORMAL HIGH (ref 70–99)

## 2022-06-30 NOTE — Progress Notes (Signed)
Daily Session Note  Patient Details  Name: Antonio Clark MRN: [id number] Date of Birth: 1936/03/02 Referring Provider:   Flowsheet Row Cardiac Rehab from 06/25/2022 in [facility]  Referring Provider Loralie Champagne MD       Encounter Date: 06/30/2022  Check In:  Session Check In - 06/30/22 1054       Check-In   Supervising physician immediately available to respond to emergencies See telemetry face sheet for immediately available ER MD    Location [facility]-Cardiac & Pulmonary Rehab    Staff Present Alberteen Sam, MA, RCEP, CCRP, Marylynn Pearson, MS, ASCM CEP, Exercise Physiologist;Willer [place] Sherryll Burger, RN BSN    Virtual Visit No    Medication changes reported     No    Fall or balance concerns reported    No    Warm-up and Cool-down Performed on first and last piece of equipment    Resistance Training Performed Yes    VAD Patient? No    PAD/SET Patient? No      Pain Assessment   Currently in Pain? No/denies                Social History   Tobacco Use  Smoking Status Never  Smokeless Tobacco Never    Goals Met:  Independence with exercise equipment Exercise tolerated well No report of concerns or symptoms today Strength training completed today  Goals Unmet:  Not Applicable  Comments: First full day of exercise!  Patient was oriented to gym and equipment including functions, settings, policies, and procedures.  Patient's individual exercise prescription and treatment plan were reviewed.  All starting workloads were established based on the results of the 6 minute walk test done at initial orientation visit.  The plan for exercise progression was also introduced and progression will be customized based on patient's performance and goals.     Dr. Emily Filbert is Medical Director for [facility].  Dr. Ottie Glazier is Medical Director for [facility] Pulmonary Rehabilitation.

## 2022-07-07 ENCOUNTER — Encounter: Payer: Medicare Other | Admitting: *Deleted

## 2022-07-07 DIAGNOSIS — I5022 Chronic systolic (congestive) heart failure: Secondary | ICD-10-CM | POA: Diagnosis not present

## 2022-07-07 LAB — GLUCOSE, CAPILLARY
Glucose-Capillary: 167 mg/dL — ABNORMAL HIGH (ref 70–99)
Glucose-Capillary: 125 mg/dL — ABNORMAL HIGH (ref 70–99)

## 2022-07-07 NOTE — Progress Notes (Signed)
Daily Session Note  Patient Details  Name: Antonio Clark MRN: [id number] Date of Birth: 1935/12/24 Referring Provider:   Flowsheet Row Cardiac Rehab from 06/25/2022 in [facility] Cardiac and Pulmonary Rehab  Referring Provider Loralie Champagne MD       Encounter Date: 07/07/2022  Check In:  Session Check In - 07/07/22 1109       Check-In   Supervising physician immediately available to respond to emergencies See telemetry face sheet for immediately available ER MD    Location [facility]-Cardiac & Pulmonary Rehab    Staff Present Renita Papa, RN Margurite Auerbach, MS, ASCM CEP, Exercise Physiologist;Jessica Luan Pulling, MA, RCEP, CCRP, CCET    Virtual Visit No    Medication changes reported     No    Fall or balance concerns reported    No    Warm-up and Cool-down Performed on first and last piece of equipment    Resistance Training Performed Yes    VAD Patient? No    PAD/SET Patient? No      Pain Assessment   Currently in Pain? No/denies                Social History   Tobacco Use  Smoking Status Never  Smokeless Tobacco Never    Goals Met:  Independence with exercise equipment Exercise tolerated well No report of concerns or symptoms today Strength training completed today  Goals Unmet:  Not Applicable  Comments: Pt able to follow exercise prescription today without complaint.  Will continue to monitor for progression.    Dr. Emily Filbert is Medical Director for [facility].  Dr. Ottie Glazier is Medical Director for [facility] Pulmonary Rehabilitation.

## 2022-07-08 ENCOUNTER — Encounter: Payer: Self-pay | Admitting: *Deleted

## 2022-07-08 DIAGNOSIS — I5022 Chronic systolic (congestive) heart failure: Secondary | ICD-10-CM

## 2022-07-08 NOTE — Progress Notes (Signed)
Cardiac Individual Treatment Plan  Patient Details  Name: Antonio Clark MRN: [id number] Date of Birth: 01/02/1936 Referring Provider:   Flowsheet Row Cardiac Rehab from 06/25/2022 in [facility] Cardiac and Pulmonary Rehab  Referring Provider Loralie Champagne MD       Initial Encounter Date:  Flowsheet Row Cardiac Rehab from 06/25/2022 in [facility] Cardiac and Pulmonary Rehab  Date 06/25/22       Visit Diagnosis: Systolic heart failure, chronic (Portal)  Patient's Home Medications on Admission:  Current Outpatient Medications:    acetaminophen (TYLENOL) 325 MG tablet, Take 2 tablets (650 mg total) by mouth every 6 (six) hours as needed for mild pain (or Fever >/= 101)., Disp: , Rfl:    amiodarone (PACERONE) 200 MG tablet, Take 0.5 tablets (100 mg total) by mouth daily., Disp: 45 tablet, Rfl: 3   apixaban (ELIQUIS) 2.5 MG TABS tablet, Take 1 tablet (2.5 mg total) by mouth 2 (two) times daily., Disp: 180 tablet, Rfl: 1   atorvastatin (LIPITOR) 80 MG tablet, Take 1 tablet (80 mg total) by mouth daily., Disp: 90 tablet, Rfl: 3   empagliflozin (JARDIANCE) 10 MG TABS tablet, Take 1 tablet (10 mg total) by mouth daily before breakfast., Disp: 90 tablet, Rfl: 3   finasteride (PROSCAR) 5 MG tablet, Take 5 mg by mouth daily., Disp: , Rfl:    fluticasone (FLONASE) 50 MCG/ACT nasal spray, Place 1 spray into both nostrils daily as needed for allergies or rhinitis., Disp: , Rfl:    furosemide (LASIX) 40 MG tablet, Take 0.5 tablets (20 mg total) by mouth daily., Disp: 90 tablet, Rfl: 3   linagliptin (TRADJENTA) 5 MG TABS tablet, Take 1 tablet (5 mg total) by mouth daily., Disp: 90 tablet, Rfl: 3   LORazepam (ATIVAN) 0.5 MG tablet, TAKE 1 TABLET BY MOUTH 2 TIMES DAILY AS NEEDED FOR ANXIETY., Disp: 30 tablet, Rfl: 1   metoprolol succinate (TOPROL-XL) 50 MG 24 hr tablet, Take 0.5 tablets (25 mg total) by mouth at bedtime. Take with or immediately following a meal., Disp: 30 tablet, Rfl: 5   nitroGLYCERIN (NITROSTAT)  0.4 MG SL tablet, Place 1 tablet (0.4 mg total) under the tongue every 5 (five) minutes x 3 doses as needed for chest pain., Disp: 25 tablet, Rfl: 12   ondansetron (ZOFRAN) 4 MG tablet, Take 1 tablet (4 mg total) by mouth every 6 (six) hours as needed for nausea., Disp: 20 tablet, Rfl: 0   oxymetazoline (AFRIN NASAL SPRAY) 0.05 % nasal spray, Place 1 spray into both nostrils 2 (two) times daily., Disp: 30 mL, Rfl: 0   spironolactone (ALDACTONE) 25 MG tablet, Take 0.5 tablets (12.5 mg total) by mouth at bedtime., Disp: 45 tablet, Rfl: 3   tamsulosin (FLOMAX) 0.4 MG CAPS capsule, Take by mouth., Disp: , Rfl:    traZODone (DESYREL) 50 MG tablet, TAKE 0.5 TABLETS BY MOUTH AT BEDTIME AS NEEDED FOR SLEEP., Disp: 45 tablet, Rfl: 3  Past Medical History: Past Medical History:  Diagnosis Date   Anal fissure    Arthritis    Atrial fibrillation (HCC)    Back pain    CHF (congestive heart failure) (HCC)    Colon polyps    Coronary artery disease    Diabetes mellitus type 2 with complications (HCC)    Dysrhythmia    a-fib   GERD (gastroesophageal reflux disease)    History of echocardiogram 07/ 07/ 2011   History of lithotripsy 1989   Hyperlipidemia    Hypertension    Kidney stones  Melanoma ([place]) 05/01/2019   right chest wall (12/20)   Prostate CA ([place]) 2010   Sleep apnea    uses CPAP nightly   Squamous cell carcinoma of skin 05/23/1992   bowens-left parietal scalp ([id number])   Squamous cell carcinoma of skin 03/14/2008   in situ-left upper outer forehead-medial ([id number])   Squamous cell carcinoma of skin 03/14/2008   in situ-crown of scalp ([id number])   Squamous cell carcinoma of skin 04/15/2011   in situ-right dorsal forearm (txpbx)   Squamous cell carcinoma of skin 10/16/2011   in situ-left sideburn   Squamous cell carcinoma of skin 03/11/2015   ka-left sideburn ([id number])   Squamous cell carcinoma of skin 03/11/2015   ka-left forearm ([id number])   Squamous cell carcinoma of skin  06/22/2018   in situ-left forearm, sup (txpbx)   Squamous cell carcinoma of skin 05/01/2019   in situ-mid anterior scalp    Squamous cell carcinoma of skin 05/01/2019   in situ-right upper arm    Tobacco Use: Social History   Tobacco Use  Smoking Status Never  Smokeless Tobacco Never    Labs: Review Flowsheet  More data exists      Latest Ref Rng & Units 12/17/2020 05/05/2021 10/30/2021 02/09/2022 05/28/2022  Labs for ITP Cardiac and Pulmonary Rehab  Cholestrol 0 - 200 mg/dL 112  108  103  - 142   LDL (calc) 0 - 99 mg/dL 51  45  38  - 54   HDL-C >40 mg/dL 37  43  48  - 55   Trlycerides <150 mg/dL 158  116  83  - 167   Hemoglobin A1c 4.8 - 5.6 % 6.7  6.6  6.3  6.4  -     Exercise Target Goals: Exercise Program Goal: Individual exercise prescription set using results from initial 6 min walk test and THRR while considering  patient's activity barriers and safety.   Exercise Prescription Goal: Initial exercise prescription builds to 30-45 minutes a day of aerobic activity, 2-3 days per week.  Home exercise guidelines will be given to patient during program as part of exercise prescription that the participant will acknowledge.   Education: Aerobic Exercise: - Group verbal and visual presentation on the components of exercise prescription. Introduces F.I.T.T principle from ACSM for exercise prescriptions.  Reviews F.I.T.T. principles of aerobic exercise including progression. Written material given at graduation.   Education: Resistance Exercise: - Group verbal and visual presentation on the components of exercise prescription. Introduces F.I.T.T principle from ACSM for exercise prescriptions  Reviews F.I.T.T. principles of resistance exercise including progression. Written material given at graduation.    Education: Exercise & Equipment Safety: - Individual verbal instruction and demonstration of equipment use and safety with use of the equipment. Flowsheet Row Cardiac Rehab  from 06/25/2022 in [facility] Cardiac and Pulmonary Rehab  Education need identified 06/25/22  Date 06/25/22  Educator [place]  Instruction Review Code 1- Verbalizes Understanding       Education: Exercise Physiology & General Exercise Guidelines: - Group verbal and written instruction with models to review the exercise physiology of the cardiovascular system and associated critical values. Provides general exercise guidelines with specific guidelines to those with heart or lung disease.  Flowsheet Row Cardiac Rehab from 06/25/2022 in [facility] Cardiac and Pulmonary Rehab  Education need identified 06/25/22       Education: Flexibility, Balance, Mind/Body Relaxation: - Group verbal and visual presentation with interactive activity on the components of exercise prescription. Introduces F.I.T.T principle from ACSM for exercise  prescriptions. Reviews F.I.T.T. principles of flexibility and balance exercise training including progression. Also discusses the mind body connection.  Reviews various relaxation techniques to help reduce and manage stress (i.e. Deep breathing, progressive muscle relaxation, and visualization). Balance handout provided to take home. Written material given at graduation.   Activity Barriers & Risk Stratification:  Activity Barriers & Cardiac Risk Stratification - 06/25/22 1612       Activity Barriers & Cardiac Risk Stratification   Activity Barriers Balance Concerns;Back Problems;Arthritis;Assistive Device;Deconditioning   walker/cane as needed but has not used in a while   Cardiac Risk Stratification High             6 Minute Walk:  6 Minute Walk     Row Name 06/25/22 1613         6 Minute Walk   Phase Initial     Distance 985 feet     Walk Time 6 minutes     # of Rest Breaks 0     MPH 1.86     METS 1.67     RPE 12     Perceived Dyspnea  0     VO2 Peak 5.85     Symptoms No     Resting HR 84 bpm     Resting BP 120/66     Resting Oxygen Saturation  99 %      Exercise Oxygen Saturation  during 6 min walk 97 %     Max Ex. HR 94 bpm     Max Ex. BP 130/62     2 Minute Post BP 122/68              Oxygen Initial Assessment:   Oxygen Re-Evaluation:   Oxygen Discharge (Final Oxygen Re-Evaluation):   Initial Exercise Prescription:  Initial Exercise Prescription - 06/25/22 1600       Date of Initial Exercise RX and Referring Provider   Date 06/25/22    Referring Provider Loralie Champagne MD      Recumbant Bike   Level 1    RPM 60    Watts 18    Minutes 15    METs 1.6      NuStep   Level 1    SPM 80    Minutes 15    METs 1.6      T5 Nustep   Level 1    SPM 80    Minutes 15    METs 1.6      Track   Laps 20    Minutes 15    METs 2.09      Prescription Details   Frequency (times per week) 2    Duration Progress to 30 minutes of continuous aerobic without signs/symptoms of physical distress      Intensity   THRR 40-80% of Max Heartrate 104 - 125    Ratings of Perceived Exertion 11-13    Perceived Dyspnea 0-4      Progression   Progression Continue to progress workloads to maintain intensity without signs/symptoms of physical distress.      Resistance Training   Training Prescription Yes    Weight 3 lb    Reps 10-15             Perform Capillary Blood Glucose checks as needed.  Exercise Prescription Changes:   Exercise Prescription Changes     Row Name 06/25/22 1600             Response to Exercise   Blood Pressure (  Admit) 120/66       Blood Pressure (Exercise) 130/62       Blood Pressure (Exit) 122/68       Heart Rate (Admit) 84 bpm       Heart Rate (Exercise) 94 bpm       Heart Rate (Exit) 79 bpm       Oxygen Saturation (Admit) 99 %       Oxygen Saturation (Exercise) 97 %       Oxygen Saturation (Exit) 99 %       Rating of Perceived Exertion (Exercise) 12       Perceived Dyspnea (Exercise) 0       Symptoms none       Comments walk test results                Exercise  Comments:   Exercise Comments     Row Name 06/30/22 1056           Exercise Comments First full day of exercise!  Patient was oriented to gym and equipment including functions, settings, policies, and procedures.  Patient's individual exercise prescription and treatment plan were reviewed.  All starting workloads were established based on the results of the 6 minute walk test done at initial orientation visit.  The plan for exercise progression was also introduced and progression will be customized based on patient's performance and goals.                Exercise Goals and Review:   Exercise Goals     Row Name 06/25/22 1621             Exercise Goals   Increase Physical Activity Yes       Intervention Provide advice, education, support and counseling about physical activity/exercise needs.;Develop an individualized exercise prescription for aerobic and resistive training based on initial evaluation findings, risk stratification, comorbidities and participant's personal goals.       Expected Outcomes Short Term: Attend rehab on a regular basis to increase amount of physical activity.;Long Term: Add in home exercise to make exercise part of routine and to increase amount of physical activity.;Long Term: Exercising regularly at least 3-5 days a week.       Increase Strength and Stamina Yes       Intervention Provide advice, education, support and counseling about physical activity/exercise needs.;Develop an individualized exercise prescription for aerobic and resistive training based on initial evaluation findings, risk stratification, comorbidities and participant's personal goals.       Expected Outcomes Short Term: Increase workloads from initial exercise prescription for resistance, speed, and METs.;Short Term: Perform resistance training exercises routinely during rehab and add in resistance training at home;Long Term: Improve cardiorespiratory fitness, muscular endurance and  strength as measured by increased METs and functional capacity (6MWT)       Able to understand and use rate of perceived exertion (RPE) scale Yes       Intervention Provide education and explanation on how to use RPE scale       Expected Outcomes Short Term: Able to use RPE daily in rehab to express subjective intensity level;Long Term:  Able to use RPE to guide intensity level when exercising independently       Able to understand and use Dyspnea scale Yes       Intervention Provide education and explanation on how to use Dyspnea scale       Expected Outcomes Short Term: Able to use Dyspnea scale daily in rehab  to express subjective sense of shortness of breath during exertion;Long Term: Able to use Dyspnea scale to guide intensity level when exercising independently       Knowledge and understanding of Target Heart Rate Range (THRR) Yes       Intervention Provide education and explanation of THRR including how the numbers were predicted and where they are located for reference       Expected Outcomes Short Term: Able to state/look up THRR;Long Term: Able to use THRR to govern intensity when exercising independently;Short Term: Able to use daily as guideline for intensity in rehab       Able to check pulse independently Yes       Intervention Provide education and demonstration on how to check pulse in carotid and radial arteries.;Review the importance of being able to check your own pulse for safety during independent exercise       Expected Outcomes Long Term: Able to check pulse independently and accurately;Short Term: Able to explain why pulse checking is important during independent exercise       Understanding of Exercise Prescription Yes       Intervention Provide education, explanation, and written materials on patient's individual exercise prescription       Expected Outcomes Short Term: Able to explain program exercise prescription;Long Term: Able to explain home exercise prescription to  exercise independently                Exercise Goals Re-Evaluation :  Exercise Goals Re-Evaluation     Row Name 06/30/22 1057             Exercise Goal Re-Evaluation   Exercise Goals Review Increase Physical Activity;Able to understand and use rate of perceived exertion (RPE) scale;Knowledge and understanding of Target Heart Rate Range (THRR);Understanding of Exercise Prescription;Increase Strength and Stamina;Able to check pulse independently       Comments Reviewed RPE scale, THR and program prescription with pt today.  Pt voiced understanding and was given a copy of goals to take home.       Expected Outcomes Short: Use RPE daily to regulate intensity.  Long: Follow program prescription in THR.                Discharge Exercise Prescription (Final Exercise Prescription Changes):  Exercise Prescription Changes - 06/25/22 1600       Response to Exercise   Blood Pressure (Admit) 120/66    Blood Pressure (Exercise) 130/62    Blood Pressure (Exit) 122/68    Heart Rate (Admit) 84 bpm    Heart Rate (Exercise) 94 bpm    Heart Rate (Exit) 79 bpm    Oxygen Saturation (Admit) 99 %    Oxygen Saturation (Exercise) 97 %    Oxygen Saturation (Exit) 99 %    Rating of Perceived Exertion (Exercise) 12    Perceived Dyspnea (Exercise) 0    Symptoms none    Comments walk test results             Nutrition:  Target Goals: Understanding of nutrition guidelines, daily intake of sodium '1500mg'$ , cholesterol '200mg'$ , calories 30% from fat and 7% or less from saturated fats, daily to have 5 or more servings of fruits and vegetables.  Education: All About Nutrition: -Group instruction provided by verbal, written material, interactive activities, discussions, models, and posters to present general guidelines for heart healthy nutrition including fat, fiber, MyPlate, the role of sodium in heart healthy nutrition, utilization of the nutrition label, and utilization of this  knowledge for  meal planning. Follow up email sent as well. Written material given at graduation.   Biometrics:  Pre Biometrics - 06/25/22 1612       Pre Biometrics   Height 5' 8.5" (1.74 m)    Weight 169 lb 6.4 oz (76.8 kg)    Waist Circumference 41 inches    Hip Circumference 39.5 inches    Waist to Hip Ratio 1.04 %    BMI (Calculated) 25.38    Single Leg Stand 3.5 seconds              Nutrition Therapy Plan and Nutrition Goals:  Nutrition Therapy & Goals - 06/25/22 1604       Intervention Plan   Intervention Prescribe, educate and counsel regarding individualized specific dietary modifications aiming towards targeted core components such as weight, hypertension, lipid management, diabetes, heart failure and other comorbidities.    Expected Outcomes Short Term Goal: Understand basic principles of dietary content, such as calories, fat, sodium, cholesterol and nutrients.;Short Term Goal: A plan has been developed with personal nutrition goals set during dietitian appointment.;Long Term Goal: Adherence to prescribed nutrition plan.             Nutrition Assessments:  MEDIFICTS Score Key: ?70 Need to make dietary changes  40-70 Heart Healthy Diet ? 40 Therapeutic Level Cholesterol Diet  Flowsheet Row Cardiac Rehab from 06/25/2022 in [facility] Cardiac and Pulmonary Rehab  Picture Your Plate Total Score on Admission 66      Picture Your Plate Scores: <59 Unhealthy dietary pattern with much room for improvement. 41-50 Dietary pattern unlikely to meet recommendations for good health and room for improvement. 51-60 More healthful dietary pattern, with some room for improvement.  >60 Healthy dietary pattern, although there may be some specific behaviors that could be improved.    Nutrition Goals Re-Evaluation:   Nutrition Goals Discharge (Final Nutrition Goals Re-Evaluation):   Psychosocial: Target Goals: Acknowledge presence or absence of significant depression and/or stress,  maximize coping skills, provide positive support system. Participant is able to verbalize types and ability to use techniques and skills needed for reducing stress and depression.   Education: Stress, Anxiety, and Depression - Group verbal and visual presentation to define topics covered.  Reviews how body is impacted by stress, anxiety, and depression.  Also discusses healthy ways to reduce stress and to treat/manage anxiety and depression.  Written material given at graduation.   Education: Sleep Hygiene -Provides group verbal and written instruction about how sleep can affect your health.  Define sleep hygiene, discuss sleep cycles and impact of sleep habits. Review good sleep hygiene tips.    Initial Review & Psychosocial Screening:  Initial Psych Review & Screening - 06/17/22 [place]? Yes   Joelene Millin his care giver, no immediate family     Barriers   Psychosocial barriers to participate in program There are no identifiable barriers or psychosocial needs.      Screening Interventions   Interventions Encouraged to exercise;To provide support and resources with identified psychosocial needs;Provide feedback about the scores to participant    Expected Outcomes Short Term goal: Utilizing psychosocial counselor, staff and physician to assist with identification of specific Stressors or current issues interfering with healing process. Setting desired goal for each stressor or current issue identified.;Long Term Goal: Stressors or current issues are controlled or eliminated.;Short Term goal: Identification and review with participant of any Quality of Life or Depression concerns  found by scoring the questionnaire.;Long Term goal: The participant improves quality of Life and PHQ9 Scores as seen by post scores and/or verbalization of changes             Quality of Life Scores:   Quality of Life - 06/25/22 1609       Quality of Life   Select  Quality of Life      Quality of Life Scores   Health/Function Pre 6.86 %    Socioeconomic Pre 30 %    Psych/Spiritual Pre 17 %    Family Pre 14 %    GLOBAL Pre 13.93 %            Scores of 19 and below usually indicate a poorer quality of life in these areas.  A difference of  2-3 points is a clinically meaningful difference.  A difference of 2-3 points in the total score of the Quality of Life Index has been associated with significant improvement in overall quality of life, self-image, physical symptoms, and general health in studies assessing change in quality of life.  PHQ-9: Review Flowsheet  More data exists      06/25/2022 02/27/2022 01/20/2022 12/29/2021 10/30/2021  Depression screen PHQ 2/9  Decreased Interest 1 0 0 0 0  Down, Depressed, Hopeless 1 0 0 0 0  PHQ - 2 Score 2 0 0 0 0  Altered sleeping 3 - - - -  Tired, decreased energy 3 - - - -  Change in appetite 0 - - - -  Feeling bad or failure about yourself  1 - - - -  Trouble concentrating 0 - - - -  Moving slowly or fidgety/restless 0 - - - -  Suicidal thoughts 1 - - - -  PHQ-9 Score 10 - - - -  Difficult doing work/chores Very difficult - - - -   Interpretation of Total Score  Total Score Depression Severity:  1-4 = Minimal depression, 5-9 = Mild depression, 10-14 = Moderate depression, 15-19 = Moderately severe depression, 20-27 = Severe depression   Psychosocial Evaluation and Intervention:  Psychosocial Evaluation - 06/17/22 1602       Psychosocial Evaluation & Interventions   Interventions Encouraged to exercise with the program and follow exercise prescription    Comments Jamani has no barriers to attending the program. He is ready to start exercising as he has not done much in the past year. He lives alone. He has support from his caregiver of 8 years,Kimberly, and distant relatives. He is attending for a diagnosis of Heart failure and he does weigh himself every day and watches for daily weight gain over  2 pounds. He knows to call his doctor if that gain occurs. He should do well with the program.    Expected Outcomes STG Burnard attends all scheduled sessions, he returns to a routine exercise program. LTG Lynann Bologna after discharge will continue with his exercise progression.    Continue Psychosocial Services  Follow up required by staff             Psychosocial Re-Evaluation:   Psychosocial Discharge (Final Psychosocial Re-Evaluation):   Vocational Rehabilitation: Provide vocational rehab assistance to qualifying candidates.   Vocational Rehab Evaluation & Intervention:   Education: Education Goals: Education classes will be provided on a variety of topics geared toward better understanding of heart health and risk factor modification. Participant will state understanding/return demonstration of topics presented as noted by education test scores.  Learning Barriers/Preferences:   General Cardiac  Education Topics:  AED/CPR: - Group verbal and written instruction with the use of models to demonstrate the basic use of the AED with the basic ABC's of resuscitation.   Anatomy and Cardiac Procedures: - Group verbal and visual presentation and models provide information about basic cardiac anatomy and function. Reviews the testing methods done to diagnose heart disease and the outcomes of the test results. Describes the treatment choices: Medical Management, Angioplasty, or Coronary Bypass Surgery for treating various heart conditions including Myocardial Infarction, Angina, Valve Disease, and Cardiac Arrhythmias.  Written material given at graduation. Flowsheet Row Cardiac Rehab from 06/25/2022 in [facility] Cardiac and Pulmonary Rehab  Education need identified 06/25/22       Medication Safety: - Group verbal and visual instruction to review commonly prescribed medications for heart and lung disease. Reviews the medication, class of the drug, and side effects. Includes the steps to  properly store meds and maintain the prescription regimen.  Written material given at graduation.   Intimacy: - Group verbal instruction through game format to discuss how heart and lung disease can affect sexual intimacy. Written material given at graduation..   Know Your Numbers and Heart Failure: - Group verbal and visual instruction to discuss disease risk factors for cardiac and pulmonary disease and treatment options.  Reviews associated critical values for Overweight/Obesity, Hypertension, Cholesterol, and Diabetes.  Discusses basics of heart failure: signs/symptoms and treatments.  Introduces Heart Failure Zone chart for action plan for heart failure.  Written material given at graduation.   Infection Prevention: - Provides verbal and written material to individual with discussion of infection control including proper hand washing and proper equipment cleaning during exercise session. Flowsheet Row Cardiac Rehab from 06/25/2022 in [facility] Cardiac and Pulmonary Rehab  Education need identified 06/25/22  Date 06/25/22  Educator [place]  Instruction Review Code 1- Verbalizes Understanding       Falls Prevention: - Provides verbal and written material to individual with discussion of falls prevention and safety. Flowsheet Row Cardiac Rehab from 06/17/2022 in [facility] Cardiac and Pulmonary Rehab  Date 06/17/22  Educator SB  Instruction Review Code 1- Verbalizes Understanding       Other: -Provides group and verbal instruction on various topics (see comments)   Knowledge Questionnaire Score:  Knowledge Questionnaire Score - 06/25/22 1603       Knowledge Questionnaire Score   Pre Score 24/26             Core Components/Risk Factors/Patient Goals at Admission:  Personal Goals and Risk Factors at Admission - 06/25/22 1621       Core Components/Risk Factors/Patient Goals on Admission    Weight Management Yes;Weight Maintenance    Intervention Weight Management: Develop a  combined nutrition and exercise program designed to reach desired caloric intake, while maintaining appropriate intake of nutrient and fiber, sodium and fats, and appropriate energy expenditure required for the weight goal.;Weight Management: Provide education and appropriate resources to help participant work on and attain dietary goals.    Admit Weight 169 lb (76.7 kg)    Goal Weight: Short Term 169 lb (76.7 kg)    Goal Weight: Long Term 169 lb (76.7 kg)    Expected Outcomes Short Term: Continue to assess and modify interventions until short term weight is achieved;Long Term: Adherence to nutrition and physical activity/exercise program aimed toward attainment of established weight goal;Weight Maintenance: Understanding of the daily nutrition guidelines, which includes 25-35% calories from fat, 7% or less cal from saturated fats, less than '200mg'$  cholesterol,  less than 1.5gm of sodium, & 5 or more servings of fruits and vegetables daily;Understanding recommendations for meals to include 15-35% energy as protein, 25-35% energy from fat, 35-60% energy from carbohydrates, less than '200mg'$  of dietary cholesterol, 20-35 gm of total fiber daily;Understanding of distribution of calorie intake throughout the day with the consumption of 4-5 meals/snacks    Diabetes Yes    Intervention Provide education about signs/symptoms and action to take for hypo/hyperglycemia.;Provide education about proper nutrition, including hydration, and aerobic/resistive exercise prescription along with prescribed medications to achieve blood glucose in normal ranges: Fasting glucose 65-99 mg/dL    Expected Outcomes Short Term: Participant verbalizes understanding of the signs/symptoms and immediate care of hyper/hypoglycemia, proper foot care and importance of medication, aerobic/resistive exercise and nutrition plan for blood glucose control.;Long Term: Attainment of HbA1C < 7%.    Heart Failure Yes    Intervention Provide a combined  exercise and nutrition program that is supplemented with education, support and counseling about heart failure. Directed toward relieving symptoms such as shortness of breath, decreased exercise tolerance, and extremity edema.    Expected Outcomes Improve functional capacity of life;Short term: Attendance in program 2-3 days a week with increased exercise capacity. Reported lower sodium intake. Reported increased fruit and vegetable intake. Reports medication compliance.;Short term: Daily weights obtained and reported for increase. Utilizing diuretic protocols set by physician.;Long term: Adoption of self-care skills and reduction of barriers for early signs and symptoms recognition and intervention leading to self-care maintenance.    Hypertension Yes    Intervention Provide education on lifestyle modifcations including regular physical activity/exercise, weight management, moderate sodium restriction and increased consumption of fresh fruit, vegetables, and low fat dairy, alcohol moderation, and smoking cessation.;Monitor prescription use compliance.    Expected Outcomes Short Term: Continued assessment and intervention until BP is < 140/50m HG in hypertensive participants. < 130/811mHG in hypertensive participants with diabetes, heart failure or chronic kidney disease.;Long Term: Maintenance of blood pressure at goal levels.    Lipids Yes    Intervention Provide education and support for participant on nutrition & aerobic/resistive exercise along with prescribed medications to achieve LDL '70mg'$ , HDL >'40mg'$ .    Expected Outcomes Short Term: Participant states understanding of desired cholesterol values and is compliant with medications prescribed. Participant is following exercise prescription and nutrition guidelines.;Long Term: Cholesterol controlled with medications as prescribed, with individualized exercise RX and with personalized nutrition plan. Value goals: LDL < '70mg'$ , HDL > 40 mg.              Education:Diabetes - Individual verbal and written instruction to review signs/symptoms of diabetes, desired ranges of glucose level fasting, after meals and with exercise. Acknowledge that pre and post exercise glucose checks will be done for 3 sessions at entry of program. [facility]  Education need identified 06/25/22  Date 06/25/22  Educator [place] Review Code 1- Verbalizes Understanding       Core Components/Risk Factors/Patient Goals Review:    Core Components/Risk Factors/Patient Goals at Discharge (Final Review):    ITP Comments:  ITP Comments     Row Name 06/17/22 1608 06/25/22 1602 06/30/22 1055 07/08/22 1102     ITP Comments Virtual orientation call completed today. he has an appointment on Date: 06/25/2022  for EP eval and gym Orientation.  [facility] . Completed 6MWT and gym orientation. Initial ITP created and sent for review  to Dr. Emily Filbert, Medical Director. First full day of exercise!  Patient was oriented to gym and equipment including functions, settings, policies, and procedures.  Patient's individual exercise prescription and treatment plan were reviewed.  All starting workloads were established based on the results of the 6 minute walk test done at initial orientation visit.  The plan for exercise progression was also introduced and progression will be customized based on patient's performance and goals. 30 Day review completed. Medical Director ITP review done, changes made as directed, and signed approval by Medical Director.   new to program             Comments:

## 2022-07-09 ENCOUNTER — Encounter: Payer: Medicare Other | Admitting: *Deleted

## 2022-07-09 DIAGNOSIS — I5022 Chronic systolic (congestive) heart failure: Secondary | ICD-10-CM

## 2022-07-09 LAB — GLUCOSE, CAPILLARY
Glucose-Capillary: 153 mg/dL — ABNORMAL HIGH (ref 70–99)
Glucose-Capillary: 103 mg/dL — ABNORMAL HIGH (ref 70–99)

## 2022-07-09 NOTE — Progress Notes (Signed)
Daily Session Note  Patient Details  Name: Antonio Clark MRN: [id number] Date of Birth: 15-Feb-1936 Referring Provider:   Flowsheet Row Cardiac Rehab from 06/25/2022 in [facility] Cardiac and Pulmonary Rehab  Referring Provider Loralie Champagne MD       Encounter Date: 07/09/2022  Check In:  Session Check In - 07/09/22 1143       Check-In   Supervising physician immediately available to respond to emergencies See telemetry face sheet for immediately available ER MD    Location [facility]-Cardiac & Pulmonary Rehab    Staff Present Hope Budds, RDN, Melody Haver, BS, Exercise Physiologist;Meredith Sherryll Burger, RN BSN;Joseph Tessie Fass, RCP,RRT,BSRT    Virtual Visit No    Medication changes reported     No    Fall or balance concerns reported    No    Warm-up and Cool-down Performed on first and last piece of equipment    Resistance Training Performed Yes    VAD Patient? No    PAD/SET Patient? No      Pain Assessment   Currently in Pain? No/denies                Social History   Tobacco Use  Smoking Status Never  Smokeless Tobacco Never    Goals Met:  Independence with exercise equipment Exercise tolerated well No report of concerns or symptoms today  Goals Unmet:  Not Applicable  Comments: Pt able to follow exercise prescription today without complaint.  Will continue to monitor for progression.    Dr. Emily Filbert is Medical Director for [place].  Dr. Ottie Glazier is Medical Director for [facility] Pulmonary Rehabilitation.

## 2022-07-14 ENCOUNTER — Encounter: Payer: Medicare Other | Attending: Cardiology | Admitting: *Deleted

## 2022-07-14 DIAGNOSIS — I5022 Chronic systolic (congestive) heart failure: Secondary | ICD-10-CM | POA: Diagnosis present

## 2022-07-14 NOTE — Progress Notes (Signed)
Daily Session Note  Patient Details  Name: Antonio Clark MRN: [id number] Date of Birth: [birth year] Referring Provider:   Flowsheet Row Cardiac Rehab from 06/25/2022 in [facility] Cardiac and Pulmonary Rehab  Referring Provider Loralie Champagne MD       Encounter Date: 07/14/2022  Check In:  Session Check In - 07/14/22 1120       Check-In   Supervising physician immediately available to respond to emergencies See telemetry face sheet for immediately available ER MD    Location [facility]-Cardiac & Pulmonary Rehab    Staff Present Renita Papa, RN BSN;Noah Tickle, BS, Exercise Physiologist;Jessica [place], MA, RCEP, CCRP, CCET    Virtual Visit No    Medication changes reported     No    Fall or balance concerns reported    No    Warm-up and Cool-down Performed on first and last piece of equipment    Resistance Training Performed Yes    VAD Patient? No    PAD/SET Patient? No      Pain Assessment   Currently in Pain? No/denies                Social History   Tobacco Use  Smoking Status Never  Smokeless Tobacco Never    Goals Met:  Independence with exercise equipment Exercise tolerated well No report of concerns or symptoms today Strength training completed today  Goals Unmet:  Not Applicable  Comments: Pt able to follow exercise prescription today without complaint.  Will continue to monitor for progression.    Dr. Emily Filbert is Medical Director for [place].  Dr. Ottie Glazier is Medical Director for [facility] Pulmonary Rehabilitation.

## 2022-07-16 ENCOUNTER — Encounter: Payer: Medicare Other | Admitting: *Deleted

## 2022-07-16 DIAGNOSIS — I5022 Chronic systolic (congestive) heart failure: Secondary | ICD-10-CM

## 2022-07-16 NOTE — Progress Notes (Signed)
Daily Session Note  Patient Details  Name: Antonio Clark MRN: [id number] Date of Birth: 08/24/1935 Referring Provider:   Flowsheet Row Cardiac Rehab from 06/25/2022 in [facility] Cardiac and Pulmonary Rehab  Referring Provider Loralie Champagne MD       Encounter Date: 07/16/2022  Check In:  Session Check In - 07/16/22 1044       Check-In   Supervising physician immediately available to respond to emergencies See telemetry face sheet for immediately available ER MD    Location [facility]-Cardiac & Pulmonary Rehab    Staff Present Alberteen Sam, MA, [place], CCRP, Bertram Gala, MS, ACSM CEP, Exercise Physiologist;Kery [place] Sherryll Burger, RN BSN    Virtual Visit No    Medication changes reported     No    Fall or balance concerns reported    No    Warm-up and Cool-down Performed on first and last piece of equipment    Resistance Training Performed Yes    VAD Patient? No    PAD/SET Patient? No      Pain Assessment   Currently in Pain? No/denies                Social History   Tobacco Use  Smoking Status Never  Smokeless Tobacco Never    Goals Met:  Independence with exercise equipment Exercise tolerated well No report of concerns or symptoms today Strength training completed today  Goals Unmet:  Not Applicable  Comments: Pt able to follow exercise prescription today without complaint.  Will continue to monitor for progression.    Dr. Emily Filbert is Medical Director for [place].  Dr. Ottie Glazier is Medical Director for [facility] Pulmonary Rehabilitation.

## 2022-07-16 NOTE — Progress Notes (Signed)
Completed initial RD consultation ?

## 2022-07-21 ENCOUNTER — Ambulatory Visit: Payer: Medicare Other | Admitting: Family Medicine

## 2022-07-21 ENCOUNTER — Encounter: Payer: Medicare Other | Admitting: *Deleted

## 2022-07-21 DIAGNOSIS — I5022 Chronic systolic (congestive) heart failure: Secondary | ICD-10-CM

## 2022-07-21 NOTE — Progress Notes (Signed)
Daily Session Note  Patient Details  Name: Antonio Clark MRN: [id number] Date of Birth: [birth year] Referring Provider:   Flowsheet Row Cardiac Rehab from 06/25/2022 in [facility] Cardiac and Pulmonary Rehab  Referring Provider Loralie Champagne MD       Encounter Date: 07/21/2022  Check In:  Session Check In - 07/21/22 1107       Check-In   Supervising physician immediately available to respond to emergencies See telemetry face sheet for immediately available ER MD    Location [facility]-Cardiac & Pulmonary Rehab    Staff Present Renita Papa, RN BSN;Noah Tickle, BS, Exercise Physiologist;Jessica [place], MA, RCEP, CCRP, CCET    Virtual Visit No    Medication changes reported     No    Fall or balance concerns reported    No    Warm-up and Cool-down Performed on first and last piece of equipment    Resistance Training Performed Yes    VAD Patient? No    PAD/SET Patient? No      Pain Assessment   Currently in Pain? No/denies                Social History   Tobacco Use  Smoking Status Never  Smokeless Tobacco Never    Goals Met:  Independence with exercise equipment Exercise tolerated well No report of concerns or symptoms today Strength training completed today  Goals Unmet:  Not Applicable  Comments: Pt able to follow exercise prescription today without complaint.  Will continue to monitor for progression.    Dr. Emily Filbert is Medical Director for [place].  Dr. Ottie Glazier is Medical Director for [facility] Pulmonary Rehabilitation.

## 2022-07-22 ENCOUNTER — Ambulatory Visit (HOSPITAL_BASED_OUTPATIENT_CLINIC_OR_DEPARTMENT_OTHER)
Admission: RE | Admit: 2022-07-22 | Discharge: 2022-07-22 | Disposition: A | Discharge status: Home / Self Care | Payer: Medicare Other | Source: Ambulatory Visit | Attending: Cardiology | Admitting: Cardiology

## 2022-07-22 ENCOUNTER — Encounter (HOSPITAL_COMMUNITY): Payer: Self-pay | Admitting: Cardiology

## 2022-07-22 ENCOUNTER — Ambulatory Visit (HOSPITAL_COMMUNITY)
Admission: RE | Admit: 2022-07-22 | Discharge: 2022-07-22 | Disposition: A | Discharge status: Home / Self Care | Payer: Medicare Other | Source: Ambulatory Visit | Attending: Cardiology | Admitting: Cardiology

## 2022-07-22 VITALS — BP 90/60 | HR 68 | Wt 175.0 lb

## 2022-07-22 DIAGNOSIS — I5022 Chronic systolic (congestive) heart failure: Secondary | ICD-10-CM | POA: Diagnosis present

## 2022-07-22 DIAGNOSIS — G4733 Obstructive sleep apnea (adult) (pediatric): Secondary | ICD-10-CM | POA: Insufficient documentation

## 2022-07-22 DIAGNOSIS — N183 Chronic kidney disease, stage 3 unspecified: Secondary | ICD-10-CM | POA: Insufficient documentation

## 2022-07-22 DIAGNOSIS — Z7984 Long term (current) use of oral hypoglycemic drugs: Secondary | ICD-10-CM | POA: Insufficient documentation

## 2022-07-22 DIAGNOSIS — I480 Paroxysmal atrial fibrillation: Secondary | ICD-10-CM | POA: Insufficient documentation

## 2022-07-22 DIAGNOSIS — Z8249 Family history of ischemic heart disease and other diseases of the circulatory system: Secondary | ICD-10-CM | POA: Insufficient documentation

## 2022-07-22 DIAGNOSIS — I7121 Aneurysm of the ascending aorta, without rupture: Secondary | ICD-10-CM | POA: Insufficient documentation

## 2022-07-22 DIAGNOSIS — I2510 Atherosclerotic heart disease of native coronary artery without angina pectoris: Secondary | ICD-10-CM | POA: Insufficient documentation

## 2022-07-22 DIAGNOSIS — Z79899 Other long term (current) drug therapy: Secondary | ICD-10-CM | POA: Insufficient documentation

## 2022-07-22 DIAGNOSIS — E785 Hyperlipidemia, unspecified: Secondary | ICD-10-CM | POA: Insufficient documentation

## 2022-07-22 DIAGNOSIS — R000 Tachycardia, unspecified: Secondary | ICD-10-CM | POA: Insufficient documentation

## 2022-07-22 DIAGNOSIS — E1122 Type 2 diabetes mellitus with diabetic chronic kidney disease: Secondary | ICD-10-CM | POA: Insufficient documentation

## 2022-07-22 DIAGNOSIS — E119 Type 2 diabetes mellitus without complications: Secondary | ICD-10-CM | POA: Diagnosis not present

## 2022-07-22 DIAGNOSIS — Z7901 Long term (current) use of anticoagulants: Secondary | ICD-10-CM | POA: Insufficient documentation

## 2022-07-22 DIAGNOSIS — I130 Hypertensive heart and chronic kidney disease with heart failure and stage 1 through stage 4 chronic kidney disease, or unspecified chronic kidney disease: Secondary | ICD-10-CM | POA: Insufficient documentation

## 2022-07-22 LAB — COMPREHENSIVE METABOLIC PANEL
ALT: 27 U/L (ref 0–44)
AST: 28 U/L (ref 15–41)
Albumin: 4.1 g/dL (ref 3.5–5.0)
Alkaline Phosphatase: 52 U/L (ref 38–126)
Anion gap: 8 (ref 5–15)
BUN: 29 mg/dL — ABNORMAL HIGH (ref 8–23)
CO2: 24 mmol/L (ref 22–32)
Calcium: 9.5 mg/dL (ref 8.9–10.3)
Chloride: 108 mmol/L (ref 98–111)
Creatinine, Ser: 1.81 mg/dL — ABNORMAL HIGH (ref 0.61–1.24)
GFR, Estimated: 36 mL/min — ABNORMAL LOW (ref >60)
Glucose, Bld: 103 mg/dL — ABNORMAL HIGH (ref 70–99)
Potassium: 4.5 mmol/L (ref 3.5–5.1)
Sodium: 140 mmol/L (ref 135–145)
Total Bilirubin: 0.7 mg/dL (ref 0.3–1.2)
Total Protein: 7.2 g/dL (ref 6.5–8.1)

## 2022-07-22 LAB — ECHOCARDIOGRAM COMPLETE
AR max vel: 3.09 cm2
AV Area VTI: 3.39 cm2
AV Area mean vel: 3.21 cm2
AV Mean grad: 1 mmHg
AV Peak grad: 2.8 mmHg
Ao pk vel: 0.84 m/s
Area-P 1/2: 1.84 cm2
Calc EF: 45.9 %
P 1/2 time: 676 msec
S' Lateral: 3.5 cm
Single Plane A2C EF: 43.6 %
Single Plane A4C EF: 44.5 %

## 2022-07-22 MED ORDER — SPIRONOLACTONE 25 MG PO TABS: 25.0000 mg | ORAL_TABLET | Freq: Every evening | ORAL | 3 refills | Status: DC

## 2022-07-22 NOTE — Progress Notes (Signed)
  Echocardiogram 2D Echocardiogram has been performed.  Antonio Clark 07/22/2022, 2:42 PM

## 2022-07-22 NOTE — Patient Instructions (Signed)
Good to see you today!  INCREASE Spironolactone to 25 mg nightly  Labs done today, your results will be available in MyChart, we will contact you for abnormal readings.  Repeat lab work in 10 days  Your physician recommends that you schedule a follow-up appointment in: 3 months with app clinic   If you have any questions or concerns before your next appointment please send [place] a message through [place] or call our office at [phone].    TO LEAVE A MESSAGE FOR THE NURSE SELECT OPTION 2, PLEASE LEAVE A MESSAGE INCLUDING: YOUR NAME DATE OF BIRTH CALL BACK NUMBER REASON FOR CALL**this is important as we prioritize the call backs  YOU WILL RECEIVE A CALL BACK THE SAME DAY AS LONG AS YOU CALL BEFORE 4:00 PM  At the [facility], you and your health needs are our priority. As part of our continuing mission to provide you with exceptional heart care, we have created designated Provider Care Teams. These Care Teams include your primary Cardiologist (physician) and Advanced Practice Providers (APPs- Physician Assistants and Nurse Practitioners) who all work together to provide you with the care you need, when you need it.   You may see any of the following providers on your designated Care Team at your next follow up: Dr Glori Bickers Dr Loralie Champagne Dr. Roxana Hires, NP Lyda Jester, [place] [facility] [place], [place] Forestine Na, NP Audry Riles, PharmD   Please be sure to bring in all your medications bottles to every appointment.   Do the following things EVERYDAY: Weigh yourself in the morning before breakfast. Write it down and keep it in a log. Take your medicines as prescribed Eat low salt foods--Limit salt (sodium) to 2000 mg per day.  Stay as active as you can everyday Limit all fluids for the day to less than 2 liters

## 2022-07-23 ENCOUNTER — Encounter: Payer: Medicare Other | Admitting: *Deleted

## 2022-07-23 DIAGNOSIS — I5022 Chronic systolic (congestive) heart failure: Secondary | ICD-10-CM

## 2022-07-23 NOTE — Progress Notes (Signed)
Daily Session Note  Patient Details  Name: Antonio Clark MRN: [id number] Date of Birth: [birth year] Referring Provider:   Flowsheet Row Cardiac Rehab from 06/25/2022 in [facility] Cardiac and Pulmonary Rehab  Referring Provider Loralie Champagne MD       Encounter Date: 07/23/2022  Check In:  Session Check In - 07/23/22 1109       Check-In   Supervising physician immediately available to respond to emergencies See telemetry face sheet for immediately available ER MD    Location [facility]-Cardiac & Pulmonary Rehab    Staff Present Darlyne Russian, RN, ADN;Meredith Sherryll Burger, RN Abel Presto, MS, ACSM CEP, Exercise Physiologist;Jessica Luan [place], MA, RCEP, CCRP, CCET    Virtual Visit No    Medication changes reported     No    Fall or balance concerns reported    No    Warm-up and Cool-down Performed on first and last piece of equipment    Resistance Training Performed Yes    VAD Patient? No    PAD/SET Patient? No      Pain Assessment   Currently in Pain? No/denies                Social History   Tobacco Use  Smoking Status Never  Smokeless Tobacco Never    Goals Met:  Independence with exercise equipment Exercise tolerated well No report of concerns or symptoms today Strength training completed today  Goals Unmet:  Not Applicable  Comments: Pt able to follow exercise prescription today without complaint.  Will continue to monitor for progression.    Dr. Emily Filbert is Medical Director for [place].  Dr. Ottie Glazier is Medical Director for [facility] Pulmonary Rehabilitation.

## 2022-07-23 NOTE — Progress Notes (Signed)
Patient ID: Antonio Clark, male   DOB: 10/10/1935, 86 y.o.   MRN: [id number] PCP: Dr. Dennard Schaumann HF Cardiology: Dr. Aundra Dubin  86 y.o. with history of HTN, hyperlipidemia, CAD, atrial fibrillation, and CHF was referred by Dr. Marlou Porch for evaluation of CHF.  Patient had atrial fibrillation diagnosed in 2019.  He had atrial fibrillation ablation in 9/21.  He was admitted in 7/23 with CHF and was found to have atrial fibrillation with RVR. Echo this admission showed EF 25-30%, mild LV dilation, RV normal, moderate biatrial enlargement.  LHC/RHC showed elevated PCWP and 70% left main stenosis.  By IVUS, the left main stenosis did not appear significant so PCI was not done.  Patient had TEE-guided DCCV.   Echo was done today and reviewed, EF 40-45%, mild global hypokinesis, mild LVH, mildly decreased RV systolic function.   Patient returns for followup of CHF.  He is in NSR today and denies palpitations.  He is feeling better overall, less fatigued.  He has been going to cardiac rehab.  No chest pain. No lightheadedness.  No orthopnea/PND. Weight is up.   ECG: NSR, nonspecific TW flattening  Labs (10/23): K 4.9, creatinine 1.94, hgb 13.8, LDL 54, TSH normal, BNP 126 Labs (11/23): K 4.6, creatinine 1.93, LFTs normal  PMH: 1. Prostate cancer: s/p seed implantation 8/10.  2. Hyperlipidemia: Myalgias with one of the statins, ? Crestor 3. HTN 4. Type 2 diabetes 5. CKD stage 3 6. Atrial fibrillation: Paroxysmal, diagnosed in 2019.  - Atrial fibrillation ablation in 9/21.  - DCCV 7/23 7. OSA: Cannot tolerate CPAP 8. Melanoma 9. CAD: LHC in 7/23 showed 70% osital left main stenosis, IVUS suggested not significant and PCI not done.  10. Chronic systolic CHF: Suspect primarily nonischemic, ?tachycardia-mediated.  - RHC (7/23): mean RA 8, PA 50/22, mean PCWP 20, CI 2.4 - Echo (7/23): EF 25-30%, mild LV dilation, RV normal, moderate biatrial enlargement.  - TEE (7/23): EF < 20%, moderate RV dysfunction, severe  biatrial enlargement, mild-moderate MR.   - Echo (12/23): EF 40-45%, mild global hypokinesis, mild LVH, mildly decreased RV systolic function.  SH: Never smoked, lives in [place], retired from [employer], never married  FH: 3 brothers with CAD, all found in late 57s-70s.  Father with sudden cardiac death.  Mother with CVA.   ROS: All systems reviewed and negative except as per HPI.   Current Outpatient Medications  Medication Sig Dispense Refill   acetaminophen (TYLENOL) 325 MG tablet Take 2 tablets (650 mg total) by mouth every 6 (six) hours as needed for mild pain (or Fever >/= 101).     amiodarone (PACERONE) 200 MG tablet Take 0.5 tablets (100 mg total) by mouth daily. 45 tablet 3   apixaban (ELIQUIS) 2.5 MG TABS tablet Take 1 tablet (2.5 mg total) by mouth 2 (two) times daily. 180 tablet 1   atorvastatin (LIPITOR) 80 MG tablet Take 1 tablet (80 mg total) by mouth daily. 90 tablet 3   empagliflozin (JARDIANCE) 10 MG TABS tablet Take 1 tablet (10 mg total) by mouth daily before breakfast. 90 tablet 3   finasteride (PROSCAR) 5 MG tablet Take 5 mg by mouth daily.     fluticasone (FLONASE) 50 MCG/ACT nasal spray Place 1 spray into both nostrils daily as needed for allergies or rhinitis.     furosemide (LASIX) 40 MG tablet Take 0.5 tablets (20 mg total) by mouth daily. 90 tablet 3   linagliptin (TRADJENTA) 5 MG TABS tablet Take 1 tablet (5  mg total) by mouth daily. 90 tablet 3   LORazepam (ATIVAN) 0.5 MG tablet TAKE 1 TABLET BY MOUTH 2 TIMES DAILY AS NEEDED FOR ANXIETY. 30 tablet 1   metoprolol succinate (TOPROL-XL) 50 MG 24 hr tablet Take 0.5 tablets (25 mg total) by mouth at bedtime. Take with or immediately following a meal. 30 tablet 5   nitroGLYCERIN (NITROSTAT) 0.4 MG SL tablet Place 1 tablet (0.4 mg total) under the tongue every 5 (five) minutes x 3 doses as needed for chest pain. 25 tablet 12   ondansetron (ZOFRAN) 4 MG tablet Take 1 tablet (4 mg total) by mouth every 6  (six) hours as needed for nausea. 20 tablet 0   oxymetazoline (AFRIN) 0.05 % nasal spray Place 1 spray into both nostrils as needed for congestion.     traZODone (DESYREL) 50 MG tablet TAKE 0.5 TABLETS BY MOUTH AT BEDTIME AS NEEDED FOR SLEEP. 45 tablet 3   spironolactone (ALDACTONE) 25 MG tablet Take 1 tablet (25 mg total) by mouth at bedtime. 45 tablet 3   No current facility-administered medications for this encounter.    BP 90/60   Pulse 68   Wt 79.4 kg (175 lb)   SpO2 98%   BMI 26.22 kg/m  General: NAD Neck: No JVD, no thyromegaly or thyroid nodule.  Lungs: Clear to auscultation bilaterally with normal respiratory effort. CV: Nondisplaced PMI.  Heart regular S1/S2, no S3/S4, no murmur.  No peripheral edema.  No carotid bruit.  Normal pedal pulses.  Abdomen: Soft, nontender, no hepatosplenomegaly, no distention.  Skin: Intact without lesions or rashes.  Neurologic: Alert and oriented x 3.  Psych: Normal affect. Extremities: No clubbing or cyanosis.  HEENT: Normal.   Assessment/Plan: 1. CAD:  70% left main stenosis on 7/23 cath.  IVUS suggested that this was not hemodynamically significant and PCI was not done.  No chest pain.  - Continue atorvastatin 80 mg daily, good lipids in 10/23.  - Given Eliquis use and no h/o PCI, he is not on ASA.  2. Chronic systolic CHF: Suspect nonischemic cardiomyopathy, possibly tachycardia-mediated. As above, he has coronary disease but this did not appear hemodynamically significant by IVUS evaluation.  Echo in 7/23 showed EF 25-30%, mild LV dilation, RV normal, moderate biatrial enlargement. He has been in NSR, and echo today showed EF up to 40-45%, mild global hypokinesis, mild LVH, mildly decreased RV systolic function.  He is not volume overloaded on exam.  NYHA class II symptoms.  BP remains soft.  - Continue Toprol XL 25 mg daily.  - Increase spironolactone to 25 mg daily, BMET today and in 10 days.  - Continue empagliflozin 10 mg daily.  -  Would not start ARB/ARNI with low BP and elevated creatinine.  - Continue cardiac rehab.  3. Atrial fibrillation: Paroxysmal, s/p ablation in 9/21 and DCCV in 7/23.  He is in NSR today, symptoms worse when in AF.  ?Tachycardia-mediated CMP. Need to keep in NSR as EF has improved in NSR (but not back to normal).  - Continue Eliquis 2.5 mg bid (age, creatinine).  - Continue amiodarone 100 mg daily, check LFTs and TSH.  He will need a regular eye exam.  3. Hyperlipidemia: Good lipids in 10/23. 4. OSA: Unable to tolerate CPAP.  5. CKD: Stage 3, BMET today.   Followup in 3 months with APP  Loralie Champagne 07/23/2022

## 2022-07-28 ENCOUNTER — Encounter: Payer: Medicare Other | Admitting: *Deleted

## 2022-07-28 DIAGNOSIS — I5022 Chronic systolic (congestive) heart failure: Secondary | ICD-10-CM

## 2022-07-28 NOTE — Progress Notes (Signed)
Daily Session Note  Patient Details  Name: Antonio Clark MRN: [id number] Date of Birth: 06-Nov-1935 Referring Provider:   Flowsheet Row Cardiac Rehab from 06/25/2022 in [facility] Cardiac and Pulmonary Rehab  Referring Provider Loralie Champagne MD       Encounter Date: 07/28/2022  Check In:  Session Check In - 07/28/22 1102       Check-In   Supervising physician immediately available to respond to emergencies See telemetry face sheet for immediately available ER MD    Location [facility]-Cardiac & Pulmonary Rehab    Staff Present Renita Papa, RN Abel Presto, MS, ACSM CEP, Exercise Physiologist;Jessica Luan Pulling, MA, RCEP, CCRP, CCET    Virtual Visit No    Medication changes reported     No    Fall or balance concerns reported    No    Warm-up and Cool-down Performed on first and last piece of equipment    Resistance Training Performed Yes    VAD Patient? No    PAD/SET Patient? No      Pain Assessment   Currently in Pain? No/denies                Social History   Tobacco Use  Smoking Status Never  Smokeless Tobacco Never    Goals Met:  Independence with exercise equipment Exercise tolerated well No report of concerns or symptoms today Strength training completed today  Goals Unmet:  Not Applicable  Comments: Pt able to follow exercise prescription today without complaint.  Will continue to monitor for progression.    Dr. Emily Filbert is Medical Director for [place].  Dr. Ottie Glazier is Medical Director for [facility] Pulmonary Rehabilitation.

## 2022-07-30 ENCOUNTER — Encounter: Payer: Medicare Other | Admitting: *Deleted

## 2022-07-30 DIAGNOSIS — I5022 Chronic systolic (congestive) heart failure: Secondary | ICD-10-CM

## 2022-07-30 NOTE — Progress Notes (Signed)
Daily Session Note  Patient Details  Name: Antonio Clark MRN: [id number] Date of Birth: Jun 26, 1936 Referring Provider:   Flowsheet Row Cardiac Rehab from 06/25/2022 in [facility] Cardiac and Pulmonary Rehab  Referring Provider Loralie Champagne MD       Encounter Date: 07/30/2022  Check In:  Session Check In - 07/30/22 1116       Check-In   Supervising physician immediately available to respond to emergencies See telemetry face sheet for immediately available ER MD    Location [facility]-Cardiac & Pulmonary Rehab    Staff Present Renita Papa, RN BSN;Joseph [place], RCP,RRT,BSRT;Noah Moran, [place], Exercise Physiologist    Virtual Visit No    Medication changes reported     No    Fall or balance concerns reported    No    Warm-up and Cool-down Performed on first and last piece of equipment    Resistance Training Performed Yes    VAD Patient? No    PAD/SET Patient? No      Pain Assessment   Currently in Pain? No/denies                Social History   Tobacco Use  Smoking Status Never  Smokeless Tobacco Never    Goals Met:  Independence with exercise equipment Exercise tolerated well No report of concerns or symptoms today Strength training completed today  Goals Unmet:  Not Applicable  Comments: Pt able to follow exercise prescription today without complaint.  Will continue to monitor for progression.    Dr. Emily Filbert is Medical Director for [place].  Dr. Ottie Glazier is Medical Director for [facility] Pulmonary Rehabilitation.

## 2022-08-04 ENCOUNTER — Encounter: Payer: Medicare Other | Admitting: *Deleted

## 2022-08-04 ENCOUNTER — Ambulatory Visit (HOSPITAL_COMMUNITY)
Admission: RE | Admit: 2022-08-04 | Discharge: 2022-08-04 | Disposition: A | Discharge status: Home / Self Care | Payer: Medicare Other | Source: Ambulatory Visit | Attending: Internal Medicine | Admitting: Internal Medicine

## 2022-08-04 DIAGNOSIS — I5022 Chronic systolic (congestive) heart failure: Secondary | ICD-10-CM

## 2022-08-04 LAB — BASIC METABOLIC PANEL
Anion gap: 9 (ref 5–15)
BUN: 36 mg/dL — ABNORMAL HIGH (ref 8–23)
CO2: 21 mmol/L — ABNORMAL LOW (ref 22–32)
Calcium: 9.3 mg/dL (ref 8.9–10.3)
Chloride: 110 mmol/L (ref 98–111)
Creatinine, Ser: 2.13 mg/dL — ABNORMAL HIGH (ref 0.61–1.24)
GFR, Estimated: 30 mL/min — ABNORMAL LOW (ref >60)
Glucose, Bld: 158 mg/dL — ABNORMAL HIGH (ref 70–99)
Potassium: 4.7 mmol/L (ref 3.5–5.1)
Sodium: 140 mmol/L (ref 135–145)

## 2022-08-04 NOTE — Progress Notes (Signed)
Daily Session Note  Patient Details  Name: KODEE RAVERT MRN: [id number] Date of Birth: 07-06-1936 Referring Provider:   Flowsheet Row Cardiac Rehab from 06/25/2022 in [facility] Cardiac and Pulmonary Rehab  Referring Provider Loralie Champagne MD       Encounter Date: 08/04/2022  Check In:  Session Check In - 08/04/22 1059       Check-In   Supervising physician immediately available to respond to emergencies See telemetry face sheet for immediately available ER MD    Location [facility]-Cardiac & Pulmonary Rehab    Staff Present Renita Papa, RN BSN;Noah Tickle, BS, Exercise Physiologist;Jessica [place], MA, RCEP, CCRP, CCET    Virtual Visit No    Medication changes reported     No    Fall or balance concerns reported    No    Warm-up and Cool-down Performed on first and last piece of equipment    Resistance Training Performed Yes    VAD Patient? No    PAD/SET Patient? No      Pain Assessment   Currently in Pain? No/denies                Social History   Tobacco Use  Smoking Status Never  Smokeless Tobacco Never    Goals Met:  Independence with exercise equipment Exercise tolerated well No report of concerns or symptoms today Strength training completed today  Goals Unmet:  Not Applicable  Comments: Pt able to follow exercise prescription today without complaint.  Will continue to monitor for progression.    Dr. Emily Filbert is Medical Director for [place].  Dr. Ottie Glazier is Medical Director for [facility] Pulmonary Rehabilitation.

## 2022-08-05 ENCOUNTER — Encounter: Payer: Self-pay | Admitting: *Deleted

## 2022-08-05 DIAGNOSIS — I5022 Chronic systolic (congestive) heart failure: Secondary | ICD-10-CM

## 2022-08-05 NOTE — Progress Notes (Signed)
Cardiac Individual Treatment Plan  Patient Details  Name: Antonio Clark MRN: [id number] Date of Birth: [birth year] Referring Provider:   Flowsheet Row Cardiac Rehab from 06/25/2022 in [facility] Cardiac and Pulmonary Rehab  Referring Provider Loralie Champagne MD       Initial Encounter Date:  Flowsheet Row Cardiac Rehab from 06/25/2022 in [facility] Cardiac and Pulmonary Rehab  Date 06/25/22       Visit Diagnosis: Systolic heart failure, chronic ([place])  Patient's Home Medications on Admission:  Current Outpatient Medications:    acetaminophen (TYLENOL) 325 MG tablet, Take 2 tablets (650 mg total) by mouth every 6 (six) hours as needed for mild pain (or Fever >/= 101)., Disp: , Rfl:    amiodarone (PACERONE) 200 MG tablet, Take 0.5 tablets (100 mg total) by mouth daily., Disp: 45 tablet, Rfl: 3   apixaban (ELIQUIS) 2.5 MG TABS tablet, Take 1 tablet (2.5 mg total) by mouth 2 (two) times daily., Disp: 180 tablet, Rfl: 1   atorvastatin (LIPITOR) 80 MG tablet, Take 1 tablet (80 mg total) by mouth daily., Disp: 90 tablet, Rfl: 3   empagliflozin (JARDIANCE) 10 MG TABS tablet, Take 1 tablet (10 mg total) by mouth daily before breakfast., Disp: 90 tablet, Rfl: 3   finasteride (PROSCAR) 5 MG tablet, Take 5 mg by mouth daily., Disp: , Rfl:    fluticasone (FLONASE) 50 MCG/ACT nasal spray, Place 1 spray into both nostrils daily as needed for allergies or rhinitis., Disp: , Rfl:    furosemide (LASIX) 40 MG tablet, Take 0.5 tablets (20 mg total) by mouth daily., Disp: 90 tablet, Rfl: 3   linagliptin (TRADJENTA) 5 MG TABS tablet, Take 1 tablet (5 mg total) by mouth daily., Disp: 90 tablet, Rfl: 3   LORazepam (ATIVAN) 0.5 MG tablet, TAKE 1 TABLET BY MOUTH 2 TIMES DAILY AS NEEDED FOR ANXIETY., Disp: 30 tablet, Rfl: 1   metoprolol succinate (TOPROL-XL) 50 MG 24 hr tablet, Take 0.5 tablets (25 mg total) by mouth at bedtime. Take with or immediately following a meal., Disp: 30 tablet, Rfl: 5   nitroGLYCERIN (NITROSTAT)  0.4 MG SL tablet, Place 1 tablet (0.4 mg total) under the tongue every 5 (five) minutes x 3 doses as needed for chest pain., Disp: 25 tablet, Rfl: 12   ondansetron (ZOFRAN) 4 MG tablet, Take 1 tablet (4 mg total) by mouth every 6 (six) hours as needed for nausea., Disp: 20 tablet, Rfl: 0   oxymetazoline (AFRIN) 0.05 % nasal spray, Place 1 spray into both nostrils as needed for congestion., Disp: , Rfl:    spironolactone (ALDACTONE) 25 MG tablet, Take 1 tablet (25 mg total) by mouth at bedtime., Disp: 45 tablet, Rfl: 3   traZODone (DESYREL) 50 MG tablet, TAKE 0.5 TABLETS BY MOUTH AT BEDTIME AS NEEDED FOR SLEEP., Disp: 45 tablet, Rfl: 3  Past Medical History: Past Medical History:  Diagnosis Date   Anal fissure    Arthritis    Atrial fibrillation (HCC)    Back pain    CHF (congestive heart failure) (HCC)    Colon polyps    Coronary artery disease    Diabetes mellitus type 2 with complications ([place])    Dysrhythmia    a-fib   GERD (gastroesophageal reflux disease)    History of echocardiogram 07/ 07/ 2011   History of lithotripsy 1989   Hyperlipidemia    Hypertension    Kidney stones    Melanoma ([place]) 05/01/2019   right chest wall (12/20)   Prostate CA ([place]) 2010  Sleep apnea    uses CPAP nightly   Squamous cell carcinoma of skin 05/23/1992   bowens-left parietal scalp ([id number])   Squamous cell carcinoma of skin 03/14/2008   in situ-left upper outer forehead-medial ([id number])   Squamous cell carcinoma of skin 03/14/2008   in situ-crown of scalp ([id number])   Squamous cell carcinoma of skin 04/15/2011   in situ-right dorsal forearm (txpbx)   Squamous cell carcinoma of skin 10/16/2011   in situ-left sideburn   Squamous cell carcinoma of skin 03/11/2015   ka-left sideburn ([id number])   Squamous cell carcinoma of skin 03/11/2015   ka-left forearm ([id number])   Squamous cell carcinoma of skin 06/22/2018   in situ-left forearm, sup (txpbx)   Squamous cell carcinoma of skin 05/01/2019   in  situ-mid anterior scalp    Squamous cell carcinoma of skin 05/01/2019   in situ-right upper arm    Tobacco Use: Social History   Tobacco Use  Smoking Status Never  Smokeless Tobacco Never    Labs: Review Flowsheet  More data exists      Latest Ref Rng & Units 12/17/2020 05/05/2021 10/30/2021 02/09/2022 05/28/2022  Labs for ITP Cardiac and Pulmonary Rehab  Cholestrol 0 - 200 mg/dL 112  108  103  - 142   LDL (calc) 0 - 99 mg/dL 51  45  38  - 54   HDL-C >40 mg/dL 37  43  48  - 55   Trlycerides <150 mg/dL 158  116  83  - 167   Hemoglobin A1c 4.8 - 5.6 % 6.7  6.6  6.3  6.4  -     Exercise Target Goals: Exercise Program Goal: Individual exercise prescription set using results from initial 6 min walk test and THRR while considering  patient's activity barriers and safety.   Exercise Prescription Goal: Initial exercise prescription builds to 30-45 minutes a day of aerobic activity, 2-3 days per week.  Home exercise guidelines will be given to patient during program as part of exercise prescription that the participant will acknowledge.   Education: Aerobic Exercise: - Group verbal and visual presentation on the components of exercise prescription. Introduces F.I.T.T principle from ACSM for exercise prescriptions.  Reviews F.I.T.T. principles of aerobic exercise including progression. Written material given at graduation.   Education: Resistance Exercise: - Group verbal and visual presentation on the components of exercise prescription. Introduces F.I.T.T principle from ACSM for exercise prescriptions  Reviews F.I.T.T. principles of resistance exercise including progression. Written material given at graduation.    Education: Exercise & Equipment Safety: - Individual verbal instruction and demonstration of equipment use and safety with use of the equipment. Flowsheet Row Cardiac Rehab from 07/30/2022 in [facility] Cardiac and Pulmonary Rehab  Education need identified 06/25/22  Date  06/25/22  Educator [place]  Instruction Review Code 1- Verbalizes Understanding       Education: Exercise Physiology & General Exercise Guidelines: - Group verbal and written instruction with models to review the exercise physiology of the cardiovascular system and associated critical values. Provides general exercise guidelines with specific guidelines to those with heart or lung disease.  Flowsheet Row Cardiac Rehab from 07/30/2022 in [facility] Cardiac and Pulmonary Rehab  Education need identified 06/25/22       Education: Flexibility, Balance, Mind/Body Relaxation: - Group verbal and visual presentation with interactive activity on the components of exercise prescription. Introduces F.I.T.T principle from ACSM for exercise prescriptions. Reviews F.I.T.T. principles of flexibility and balance exercise training including progression. Also discusses the mind body  connection.  Reviews various relaxation techniques to help reduce and manage stress (i.e. Deep breathing, progressive muscle relaxation, and visualization). Balance handout provided to take home. Written material given at graduation.   Activity Barriers & Risk Stratification:  Activity Barriers & Cardiac Risk Stratification - 06/25/22 1612       Activity Barriers & Cardiac Risk Stratification   Activity Barriers Balance Concerns;Back Problems;Arthritis;Assistive Device;Deconditioning   walker/cane as needed but has not used in a while   Cardiac Risk Stratification High             6 Minute Walk:  6 Minute Walk     Row Name 06/25/22 1613         6 Minute Walk   Phase Initial     Distance 985 feet     Walk Time 6 minutes     # of Rest Breaks 0     MPH 1.86     METS 1.67     RPE 12     Perceived Dyspnea  0     VO2 Peak 5.85     Symptoms No     Resting HR 84 bpm     Resting BP 120/66     Resting Oxygen Saturation  99 %     Exercise Oxygen Saturation  during 6 min walk 97 %     Max Ex. HR 94 bpm     Max Ex. BP  130/62     2 Minute Post BP 122/68              Oxygen Initial Assessment:   Oxygen Re-Evaluation:   Oxygen Discharge (Final Oxygen Re-Evaluation):   Initial Exercise Prescription:  Initial Exercise Prescription - 06/25/22 1600       Date of Initial Exercise RX and Referring Provider   Date 06/25/22    Referring Provider Loralie Champagne MD      Recumbant Bike   Level 1    RPM 60    Watts 18    Minutes 15    METs 1.6      NuStep   Level 1    SPM 80    Minutes 15    METs 1.6      T5 Nustep   Level 1    SPM 80    Minutes 15    METs 1.6      Track   Laps 20    Minutes 15    METs 2.09      Prescription Details   Frequency (times per week) 2    Duration Progress to 30 minutes of continuous aerobic without signs/symptoms of physical distress      Intensity   THRR 40-80% of Max Heartrate 104 - 125    Ratings of Perceived Exertion 11-13    Perceived Dyspnea 0-4      Progression   Progression Continue to progress workloads to maintain intensity without signs/symptoms of physical distress.      Resistance Training   Training Prescription Yes    Weight 3 lb    Reps 10-15             Perform Capillary Blood Glucose checks as needed.  Exercise Prescription Changes:   Exercise Prescription Changes     Row Name 06/25/22 1600 07/16/22 1500 07/28/22 1100 07/28/22 1400       Response to Exercise   Blood Pressure (Admit) 120/66 120/62 -- 116/62    Blood Pressure (Exercise) 130/62 142/64 -- 128/62  Blood Pressure (Exit) 122/68 126/64 -- 110/64    Heart Rate (Admit) 84 bpm 68 bpm -- 69 bpm    Heart Rate (Exercise) 94 bpm 119 bpm -- 117 bpm    Heart Rate (Exit) 79 bpm 64 bpm -- 78 bpm    Oxygen Saturation (Admit) 99 % -- -- --    Oxygen Saturation (Exercise) 97 % -- -- --    Oxygen Saturation (Exit) 99 % -- -- --    Rating of Perceived Exertion (Exercise) 12 14 -- 15    Perceived Dyspnea (Exercise) 0 -- -- --    Symptoms none none -- none     Comments walk test results First three rehab sessions -- --    Duration -- Continue with 30 min of aerobic exercise without signs/symptoms of physical distress. -- Continue with 30 min of aerobic exercise without signs/symptoms of physical distress.    Intensity -- THRR unchanged -- THRR unchanged      Progression   Progression -- Continue to progress workloads to maintain intensity without signs/symptoms of physical distress. -- Continue to progress workloads to maintain intensity without signs/symptoms of physical distress.    Average METs -- 2.55 -- 2.63      Resistance Training   Training Prescription -- Yes -- Yes    Weight -- 3 lb -- 3 lb    Reps -- 10-15 -- 10-15      Interval Training   Interval Training -- No -- No      Treadmill   MPH -- -- -- 2.5    Grade -- -- -- 1    Minutes -- -- -- 15    METs -- -- -- 3.26      Recumbant Bike   Level -- 1 -- 1.5    Watts -- 16 -- --    Minutes -- 15 -- 15    METs -- 3.02 -- 1.7      NuStep   Level -- 4 -- 5    Minutes -- 15 -- 15    METs -- 2.8 -- 3.2      T5 Nustep   Level -- 1 -- 3    Minutes -- 15 -- 15    METs -- 1.9 -- 2      Track   Laps -- 30 -- 30    Minutes -- 15 -- 15    METs -- 2.63 -- 2.63      Home Exercise Plan   Plans to continue exercise at -- -- [employer] (comment)  [name] plans to walk at home, and go to the [facility] to use [facility] aerobic machines[facility] ([facility]  [place] plans to walk at home, and go to [employer] to use their aerobic machines.    Frequency -- -- Add 3 additional days to program exercise sessions. Add 3 additional days to program exercise sessions.    Initial Home Exercises Provided -- -- 07/28/22 07/28/22      Oxygen   Maintain Oxygen Saturation -- 88% or higher 88% or higher 88% or higher             Exercise Comments:   Exercise Comments     Row Name 06/30/22 1056           Exercise Comments First full day of exercise!  Patient was oriented to  gym and equipment including functions, settings, policies, and procedures.  Patient's individual exercise prescription and treatment plan were reviewed.  All starting  workloads were established based on the results of the 6 minute walk test done at initial orientation visit.  The plan for exercise progression was also introduced and progression will be customized based on patient's performance and goals.                Exercise Goals and Review:   Exercise Goals     Row Name 06/25/22 1621             Exercise Goals   Increase Physical Activity Yes       Intervention Provide advice, education, support and counseling about physical activity/exercise needs.;Develop an individualized exercise prescription for aerobic and resistive training based on initial evaluation findings, risk stratification, comorbidities and participant's personal goals.       Expected Outcomes Short Term: Attend rehab on a regular basis to increase amount of physical activity.;Long Term: Add in home exercise to make exercise part of routine and to increase amount of physical activity.;Long Term: Exercising regularly at least 3-5 days a week.       Increase Strength and Stamina Yes       Intervention Provide advice, education, support and counseling about physical activity/exercise needs.;Develop an individualized exercise prescription for aerobic and resistive training based on initial evaluation findings, risk stratification, comorbidities and participant's personal goals.       Expected Outcomes Short Term: Increase workloads from initial exercise prescription for resistance, speed, and METs.;Short Term: Perform resistance training exercises routinely during rehab and add in resistance training at home;Long Term: Improve cardiorespiratory fitness, muscular endurance and strength as measured by increased METs and functional capacity (6MWT)       Able to understand and use rate of perceived exertion (RPE) scale Yes        Intervention Provide education and explanation on how to use RPE scale       Expected Outcomes Short Term: Able to use RPE daily in rehab to express subjective intensity level;Long Term:  Able to use RPE to guide intensity level when exercising independently       Able to understand and use Dyspnea scale Yes       Intervention Provide education and explanation on how to use Dyspnea scale       Expected Outcomes Short Term: Able to use Dyspnea scale daily in rehab to express subjective sense of shortness of breath during exertion;Long Term: Able to use Dyspnea scale to guide intensity level when exercising independently       Knowledge and understanding of Target Heart Rate Range (THRR) Yes       Intervention Provide education and explanation of THRR including how the numbers were predicted and where they are located for reference       Expected Outcomes Short Term: Able to state/look up THRR;Long Term: Able to use THRR to govern intensity when exercising independently;Short Term: Able to use daily as guideline for intensity in rehab       Able to check pulse independently Yes       Intervention Provide education and demonstration on how to check pulse in carotid and radial arteries.;Review the importance of being able to check your own pulse for safety during independent exercise       Expected Outcomes Long Term: Able to check pulse independently and accurately;Short Term: Able to explain why pulse checking is important during independent exercise       Understanding of Exercise Prescription Yes       Intervention Provide education, explanation, and written  materials on patient's individual exercise prescription       Expected Outcomes Short Term: Able to explain program exercise prescription;Long Term: Able to explain home exercise prescription to exercise independently                Exercise Goals Re-Evaluation :  Exercise Goals Re-Evaluation     Row Name 06/30/22 1057 07/16/22 1518  07/28/22 1131 07/28/22 1500       Exercise Goal Re-Evaluation   Exercise Goals Review Increase Physical Activity;Able to understand and use rate of perceived exertion (RPE) scale;Knowledge and understanding of Target Heart Rate Range (THRR);Understanding of Exercise Prescription;Increase Strength and Stamina;Able to check pulse independently Increase Physical Activity;Understanding of Exercise Prescription;Increase Strength and Stamina Increase Physical Activity;Understanding of Exercise Prescription;Increase Strength and Stamina;Able to check pulse independently;Knowledge and understanding of Target Heart Rate Range (THRR);Able to understand and use Dyspnea scale;Able to understand and use rate of perceived exertion (RPE) scale Increase Physical Activity;Increase Strength and Stamina;Understanding of Exercise Prescription    Comments Reviewed RPE scale, THR and program prescription with pt today.  Pt voiced understanding and was given a copy of goals to take home. [name] is off to a good start in rehab. He had an overall average MET level of 2.55 METs during his first three sessions. He also was able to walk up to 30 laps on the track and worked up to level 4 on the T4. He did well with 3 lb hand weights for resistance training as well. We will continue to monitor his progress in the program. Reviewed home exercise with pt today.  Pt plans to walk at home, as well as go to the [facility] and use their aerobic machines for exercise.  Reviewed THR, pulse, RPE, sign and symptoms, pulse oximetery and when to call 911 or MD.  Also discussed weather considerations and indoor options.  Pt voiced understanding. [name] continues to do well in rehab. He did increase to level 5 on the T4 nustep and also was able to use the treadmill for the first time which he did well, working over 3.2 METS! He is staying steady at 30 laps on the track and would beenfit from increasing beyond that. We will continue to monitor.     Expected Outcomes Short: Use RPE daily to regulate intensity.  Long: Follow program prescription in THR. Short: Continue to follow current exercise prescription. Long: Continue to increase strength and stamina. Short: Begin to walk at home on days away from rehab. Long: Continue to exercise independently. Short: Increase number of laps on track over 30 Long: Continue to increase overall MET level             Discharge Exercise Prescription (Final Exercise Prescription Changes):  Exercise Prescription Changes - 07/28/22 1400       Response to Exercise   Blood Pressure (Admit) 116/62    Blood Pressure (Exercise) 128/62    Blood Pressure (Exit) 110/64    Heart Rate (Admit) 69 bpm    Heart Rate (Exercise) 117 bpm    Heart Rate (Exit) 78 bpm    Rating of Perceived Exertion (Exercise) 15    Symptoms none    Duration Continue with 30 min of aerobic exercise without signs/symptoms of physical distress.    Intensity THRR unchanged      Progression   Progression Continue to progress workloads to maintain intensity without signs/symptoms of physical distress.    Average METs 2.63      Resistance Training   Training  Prescription Yes    Weight 3 lb    Reps 10-15      Interval Training   Interval Training No      Treadmill   MPH 2.5    Grade 1    Minutes 15    METs 3.26      Recumbant Bike   Level 1.5    Minutes 15    METs 1.7      NuStep   Level 5    Minutes 15    METs 3.2      T5 Nustep   Level 3    Minutes 15    METs 2      Track   Laps 30    Minutes 15    METs 2.63      Home Exercise Plan   Plans to continue exercise at [employer] (comment)   [name] plans to walk at home, and go to the [facility] to use their aerobic machines.   Frequency Add 3 additional days to program exercise sessions.    Initial Home Exercises Provided 07/28/22      Oxygen   Maintain Oxygen Saturation 88% or higher             Nutrition:  Target Goals: Understanding of  nutrition guidelines, daily intake of sodium <1571m, cholesterol <2023m calories 30% from fat and 7% or less from saturated fats, daily to have 5 or more servings of fruits and vegetables.  Education: All About Nutrition: -Group instruction provided by verbal, written material, interactive activities, discussions, models, and posters to present general guidelines for heart healthy nutrition including fat, fiber, MyPlate, the role of sodium in heart healthy nutrition, utilization of the nutrition label, and utilization of this knowledge for meal planning. Follow up email sent as well. Written material given at graduation. [facility]  Date 07/09/22  [Nutrition 2]  [facility] 1- Verbalizes Understanding       Biometrics:  Pre Biometrics - 06/25/22 1612       Pre Biometrics   Height 5' 8.5" (1.74 m)    Weight 169 lb 6.4 oz (76.8 kg)    Waist Circumference 41 inches    Hip Circumference 39.5 inches    Waist to Hip Ratio 1.04 %    BMI (Calculated) 25.38    Single Leg Stand 3.5 seconds              Nutrition Therapy Plan and Nutrition Goals:  Nutrition Therapy & Goals - 07/16/22 1204       Nutrition Therapy   Diet Heart healthy, low Na, T2DM MNT    Drug/Food Interactions Statins/Certain Fruits    Protein (specify units) 80-90g    Fiber 21 grams    Whole Grain Foods 3 servings    Saturated Fats 12 max. grams    Fruits and Vegetables 8 servings/day    Sodium 1.5 grams      Personal Nutrition Goals   Nutrition Goal ST: eat at least 1 protien source at each meal and snack, have a consistent meal/snack at dinner time LT: meet energy and protein needs, continue with heart healthy diet    Comments [age].o. M admitted to cardiac rehab s/p chronic systolic HF. PMHx includes T2DM, CAD, HTN, ,malignant neoplasm of prostate (2016), HLD. Relevant medications includes lipitor, jardiance, lasix,  tradjenta, ativan, zofran, trazodone. PYP Score: 66. Vegetables & Fruits 9/12. Breads, Grains & Cereals  10/12. Red & Processed Meat 10/12. Poultry 2/2. Fish & Shellfish 1/4. Beans, Nuts & Seeds 2/4. Milk & Dairy Foods 4/6. Toppings, Oils, Seasonings & Salt 13/20. Sweets, Snacks & Restaurant Food 6/14. Beverages 9/10. B: medication with glucerna (1/2 now, 1/2 afternoon) with whole wheat toast.  L: cabbage and green beans with sometimes rice D: sometimes will not eat dinner, sometimes will go out, he eats red meat 1x/week. He also gets canned salmon to have during the week such as salmon and grits. He does not use added salt and he likes adding vinegar. He loves [place] beans - suggested to include these with his vegetables at lunch to boost the protein. Discussed heart healthy eating and T2DM MNT.      Intervention Plan   Intervention Prescribe, educate and counsel regarding individualized specific dietary modifications aiming towards targeted core components such as weight, hypertension, lipid management, diabetes, heart failure and other comorbidities.    Expected Outcomes Short Term Goal: Understand basic principles of dietary content, such as calories, fat, sodium, cholesterol and nutrients.;Short Term Goal: A plan has been developed with personal nutrition goals set during dietitian appointment.;Long Term Goal: Adherence to prescribed nutrition plan.             Nutrition Assessments:  MEDIFICTS Score Key: ?70 Need to make dietary changes  40-70 Heart Healthy Diet ? 40 Therapeutic Level Cholesterol Diet  Flowsheet Row Cardiac Rehab from 06/25/2022 in [facility] Cardiac and Pulmonary Rehab  Picture Your Plate Total Score on Admission 66      Picture Your Plate Scores: <16 Unhealthy dietary pattern with much room for improvement. 41-50 Dietary pattern unlikely to meet recommendations for good health and room for improvement. 51-60 More healthful dietary pattern, with some room for improvement.   >60 Healthy dietary pattern, although there may be some specific behaviors that could be improved.    Nutrition Goals Re-Evaluation:   Nutrition Goals Discharge (Final Nutrition Goals Re-Evaluation):   Psychosocial: Target Goals: Acknowledge presence or absence of significant depression and/or stress, maximize coping skills, provide positive support system. Participant is able to verbalize types and ability to use techniques and skills needed for reducing stress and depression.   Education: Stress, Anxiety, and Depression - Group verbal and visual presentation to define topics covered.  Reviews how body is impacted by stress, anxiety, and depression.  Also discusses healthy ways to reduce stress and to treat/manage anxiety and depression.  Written material given at graduation.   Education: Sleep Hygiene -Provides group verbal and written instruction about how sleep can affect your health.  Define sleep hygiene, discuss sleep cycles and impact of sleep habits. Review good sleep hygiene tips.    Initial Review & Psychosocial Screening:  Initial Psych Review & Screening - 06/17/22 [place]? Yes   Joelene Millin his care giver, no immediate family     Barriers   Psychosocial barriers to participate in program There are no identifiable barriers or psychosocial needs.      Screening Interventions   Interventions Encouraged to exercise;To provide support and resources with identified psychosocial needs;Provide feedback about the scores to participant    Expected Outcomes Short Term goal: Utilizing psychosocial counselor, staff and physician to assist with identification of specific Stressors or current issues interfering with healing process. Setting desired goal for each stressor or current issue identified.;Long Term Goal: Stressors or current issues are controlled or eliminated.;Short Term goal: Identification and review with participant  of any Quality  of Life or Depression concerns found by scoring the questionnaire.;Long Term goal: The participant improves quality of Life and PHQ9 Scores as seen by post scores and/or verbalization of changes             Quality of Life Scores:   Quality of Life - 06/25/22 1609       Quality of Life   Select Quality of Life      Quality of Life Scores   Health/Function Pre 6.86 %    Socioeconomic Pre 30 %    Psych/Spiritual Pre 17 %    Family Pre 14 %    GLOBAL Pre 13.93 %            Scores of 19 and below usually indicate a poorer quality of life in these areas.  A difference of  2-3 points is a clinically meaningful difference.  A difference of 2-3 points in the total score of the Quality of Life Index has been associated with significant improvement in overall quality of life, self-image, physical symptoms, and general health in studies assessing change in quality of life.  PHQ-9: Review Flowsheet  More data exists      07/21/2022 06/25/2022 02/27/2022 01/20/2022 12/29/2021  Depression screen PHQ 2/9  Decreased Interest 1 1 0 0 0  Down, Depressed, Hopeless 0 1 0 0 0  PHQ - 2 Score 1 2 0 0 0  Altered sleeping 0 3 - - -  Tired, decreased energy 1 3 - - -  Change in appetite 0 0 - - -  Feeling bad or failure about yourself  0 1 - - -  Trouble concentrating 0 0 - - -  Moving slowly or fidgety/restless 0 0 - - -  Suicidal thoughts 0 1 - - -  PHQ-9 Score 2 10 - - -  Difficult doing work/chores Somewhat difficult Very difficult - - -   Interpretation of Total Score  Total Score Depression Severity:  1-4 = Minimal depression, 5-9 = Mild depression, 10-14 = Moderate depression, 15-19 = Moderately severe depression, 20-27 = Severe depression   Psychosocial Evaluation and Intervention:  Psychosocial Evaluation - 06/17/22 1602       Psychosocial Evaluation & Interventions   Interventions Encouraged to exercise with the program and follow exercise prescription    Comments Keiran has no  barriers to attending the program. He is ready to start exercising as he has not done much in the past year. He lives alone. He has support from his caregiver of 8 years,Kimberly, and distant relatives. He is attending for a diagnosis of Heart failure and he does weigh himself every day and watches for daily weight gain over 2 pounds. He knows to call his doctor if that gain occurs. He should do well with the program.    Expected Outcomes STG Kenta attends all scheduled sessions, he returns to a routine exercise program. LTG Lynann Bologna after discharge will continue with his exercise progression.    Continue Psychosocial Services  Follow up required by staff             Psychosocial Re-Evaluation:  Psychosocial Re-Evaluation     [place] Name 07/23/22 1114             Psychosocial Re-Evaluation   Current issues with None Identified       Comments [name] reports enjoying the program. He feels stronger, but still wants to increase his stamina. He still is having chronic sleep issues, he doesn't  like his cpap and doesn't feel like he sleeps well nightly. His MD is aware. 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 He used to golf for fun but doesn't feel up for it so now he enjoys watching movies for fun.       Expected Outcomes Short: continue to attend cardiac rehab for education and exercise. Long: maintain positive self care habits.       Continue Psychosocial Services  Follow up required by staff                Psychosocial Discharge (Final Psychosocial Re-Evaluation):  Psychosocial Re-Evaluation - 07/23/22 1114       Psychosocial Re-Evaluation   Current issues with None Identified    Comments [name] reports enjoying the program. He feels stronger, but still wants to increase his stamina. He still is having chronic sleep issues, he doesn't like his cpap and doesn't feel like he sleeps well nightly. His MD is aware. 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 He used to golf for  fun but doesn't feel up for it so now he enjoys watching movies for fun.    Expected Outcomes Short: continue to attend cardiac rehab for education and exercise. Long: maintain positive self care habits.    Continue Psychosocial Services  Follow up required by staff             Vocational Rehabilitation: Provide vocational rehab assistance to qualifying candidates.   Vocational Rehab Evaluation & Intervention:   Education: Education Goals: Education classes will be provided on a variety of topics geared toward better understanding of heart health and risk factor modification. Participant will state understanding/return demonstration of topics presented as noted by education test scores.  Learning Barriers/Preferences:   General Cardiac Education Topics:  AED/CPR: - Group verbal and written instruction with the use of models to demonstrate the basic use of the AED with the basic ABC's of resuscitation.   Anatomy and Cardiac Procedures: - Group verbal and visual presentation and models provide information about basic cardiac anatomy and function. Reviews the testing methods done to diagnose heart disease and the outcomes of the test results. Describes the treatment choices: Medical Management, Angioplasty, or Coronary Bypass Surgery for treating various heart conditions including Myocardial Infarction, Angina, Valve Disease, and Cardiac Arrhythmias.  Written material given at graduation. Flowsheet Row Cardiac Rehab from 07/30/2022 in [facility] Cardiac and Pulmonary Rehab  Education need identified 06/25/22       Medication Safety: - Group verbal and visual instruction to review commonly prescribed medications for heart and lung disease. Reviews the medication, class of the drug, and side effects. Includes the steps to properly store meds and maintain the prescription regimen.  Written material given at graduation. Flowsheet Row Cardiac Rehab from 07/30/2022 in [facility] Cardiac and  Pulmonary Rehab  Date 07/23/22  Educator SB  Instruction Review Code 1- Verbalizes Understanding       Intimacy: - Group verbal instruction through game format to discuss how heart and lung disease can affect sexual intimacy. Written material given at graduation..   Know Your Numbers and Heart Failure: - Group verbal and visual instruction to discuss disease risk factors for cardiac and pulmonary disease and treatment options.  Reviews associated critical values for Overweight/Obesity, Hypertension, Cholesterol, and Diabetes.  Discusses basics of heart failure: signs/symptoms and treatments.  Introduces Heart Failure Zone chart for action plan for heart failure.  Written material given at graduation. Flowsheet Row Cardiac Rehab from 07/30/2022 in [facility] Cardiac  and Pulmonary Rehab  Date 07/30/22  Educator SB  Instruction Review Code 1- Verbalizes Understanding       Infection Prevention: - Provides verbal and written material to individual with discussion of infection control including proper hand washing and proper equipment cleaning during exercise session. Flowsheet Row Cardiac Rehab from 07/30/2022 in [facility] Cardiac and Pulmonary Rehab  Education need identified 06/25/22  Date 06/25/22  Educator [place]  Instruction Review Code 1- Verbalizes Understanding       Falls Prevention: - Provides verbal and written material to individual with discussion of falls prevention and safety. Flowsheet Row Cardiac Rehab from 06/17/2022 in [facility] Cardiac and Pulmonary Rehab  Date 06/17/22  Educator SB  Instruction Review Code 1- Verbalizes Understanding       Other: -Provides group and verbal instruction on various topics (see comments)   Knowledge Questionnaire Score:  Knowledge Questionnaire Score - 06/25/22 1603       Knowledge Questionnaire Score   Pre Score 24/26             Core Components/Risk Factors/Patient Goals at Admission:  Personal Goals and Risk Factors at  Admission - 06/25/22 1621       Core Components/Risk Factors/Patient Goals on Admission    Weight Management Yes;Weight Maintenance    Intervention Weight Management: Develop a combined nutrition and exercise program designed to reach desired caloric intake, while maintaining appropriate intake of nutrient and fiber, sodium and fats, and appropriate energy expenditure required for the weight goal.;Weight Management: Provide education and appropriate resources to help participant work on and attain dietary goals.    Admit Weight 169 lb (76.7 kg)    Goal Weight: Short Term 169 lb (76.7 kg)    Goal Weight: Long Term 169 lb (76.7 kg)    Expected Outcomes Short Term: Continue to assess and modify interventions until short term weight is achieved;Long Term: Adherence to nutrition and physical activity/exercise program aimed toward attainment of established weight goal;Weight Maintenance: Understanding of the daily nutrition guidelines, which includes 25-35% calories from fat, 7% or less cal from saturated fats, less than 260m cholesterol, less than 1.5gm of sodium, & 5 or more servings of fruits and vegetables daily;Understanding recommendations for meals to include 15-35% energy as protein, 25-35% energy from fat, 35-60% energy from carbohydrates, less than 2041mof dietary cholesterol, 20-35 gm of total fiber daily;Understanding of distribution of calorie intake throughout the day with the consumption of 4-5 meals/snacks    Diabetes Yes    Intervention Provide education about signs/symptoms and action to take for hypo/hyperglycemia.;Provide education about proper nutrition, including hydration, and aerobic/resistive exercise prescription along with prescribed medications to achieve blood glucose in normal ranges: Fasting glucose 65-99 mg/dL    Expected Outcomes Short Term: Participant verbalizes understanding of the signs/symptoms and immediate care of hyper/hypoglycemia, proper foot care and importance of  medication, aerobic/resistive exercise and nutrition plan for blood glucose control.;Long Term: Attainment of HbA1C < 7%.    Heart Failure Yes    Intervention Provide a combined exercise and nutrition program that is supplemented with education, support and counseling about heart failure. Directed toward relieving symptoms such as shortness of breath, decreased exercise tolerance, and extremity edema.    Expected Outcomes Improve functional capacity of life;Short term: Attendance in program 2-3 days a week with increased exercise capacity. Reported lower sodium intake. Reported increased fruit and vegetable intake. Reports medication compliance.;Short term: Daily weights obtained and reported for increase. Utilizing diuretic protocols set by physician.;Long term: Adoption of self-care  skills and reduction of barriers for early signs and symptoms recognition and intervention leading to self-care maintenance.    Hypertension Yes    Intervention Provide education on lifestyle modifcations including regular physical activity/exercise, weight management, moderate sodium restriction and increased consumption of fresh fruit, vegetables, and low fat dairy, alcohol moderation, and smoking cessation.;Monitor prescription use compliance.    Expected Outcomes Short Term: Continued assessment and intervention until BP is < 140/13m HG in hypertensive participants. < 130/859mHG in hypertensive participants with diabetes, heart failure or chronic kidney disease.;Long Term: Maintenance of blood pressure at goal levels.    Lipids Yes    Intervention Provide education and support for participant on nutrition & aerobic/resistive exercise along with prescribed medications to achieve LDL <7010mHDL >50m39m  Expected Outcomes Short Term: Participant states understanding of desired cholesterol values and is compliant with medications prescribed. Participant is following exercise prescription and nutrition guidelines.;Long Term:  Cholesterol controlled with medications as prescribed, with individualized exercise RX and with personalized nutrition plan. Value goals: LDL < 70mg75mL > 40 mg.             Education:Diabetes - Individual verbal and written instruction to review signs/symptoms of diabetes, desired ranges of glucose level fasting, after meals and with exercise. Acknowledge that pre and post exercise glucose checks will be done for 3 sessions at entry of program. [facility]  Education need identified 06/25/22  Date 06/25/22  Educator KL  ICanbytruction Review Code 1- Verbalizes Understanding       Core Components/Risk Factors/Patient Goals Review:   Goals and Risk Factor Review     Row Name 07/23/22 1107             Core Components/Risk Factors/Patient Goals Review   Personal Goals Review Diabetes;Heart Failure;Hypertension;Lipids       Review Saw heart failure team yesterday and they increased his spirinolactone some and did a repeat echo. He thinks his little weight gain is not fluid, but that he has been eating more lately. He doesn't have any other signs of fluid over load right now. His diabetes is being well managed. He doesn't have to check it every day, but knows the symptoms of low/high blood sugar. His blood pressure has been stable in the program so far.       Expected Outcomes Short: adhere to medication change and daily weights. Long: independenlty manage risk factors.                Core Components/Risk Factors/Patient Goals at Discharge (Final Review):   Goals and Risk Factor Review - 07/23/22 1107       Core Components/Risk Factors/Patient Goals Review   Personal Goals Review Diabetes;Heart Failure;Hypertension;Lipids    Review Saw heart failure team yesterday and they increased his spirinolactone some and did a repeat echo. He thinks his little weight gain is not fluid, but that he has been eating more  lately. He doesn't have any other signs of fluid over load right now. His diabetes is being well managed. He doesn't have to check it every day, but knows the symptoms of low/high blood sugar. His blood pressure has been stable in the program so far.    Expected Outcomes Short: adhere to medication change and daily weights. Long: independenlty manage risk factors.             ITP Comments:  ITP Comments     Row Name 06/17/22  1608 06/25/22 1602 06/30/22 1055 07/08/22 1102 07/16/22 1257   ITP Comments Virtual orientation call completed today. he has an appointment on Date: 06/25/2022  for EP eval and gym Orientation.  Documentation of diagnosis can be found in [facility] Date: 05/28/2022 . Completed 6MWT and gym orientation. Initial ITP created and sent for review to Dr. Emily Filbert, Medical Director. First full day of exercise!  Patient was oriented to gym and equipment including functions, settings, policies, and procedures.  Patient's individual exercise prescription and treatment plan were reviewed.  All starting workloads were established based on the results of the 6 minute walk test done at initial orientation visit.  The plan for exercise progression was also introduced and progression will be customized based on patient's performance and goals. 30 Day review completed. Medical Director ITP review done, changes made as directed, and signed approval by Medical Director.   new to program Completed initial RD consultation    [place] Name 08/05/22 1128           ITP Comments 30 Day review completed. Medical Director ITP review done, changes made as directed, and signed approval by Medical Director.                Comments:

## 2022-08-06 ENCOUNTER — Encounter: Payer: Medicare Other | Admitting: *Deleted

## 2022-08-07 ENCOUNTER — Other Ambulatory Visit (HOSPITAL_COMMUNITY): Payer: Self-pay

## 2022-08-07 ENCOUNTER — Telehealth (HOSPITAL_COMMUNITY): Payer: Self-pay

## 2022-08-07 DIAGNOSIS — I5022 Chronic systolic (congestive) heart failure: Secondary | ICD-10-CM

## 2022-08-07 NOTE — Telephone Encounter (Signed)
Patient aware and agreeable. Labs ordered and scheduled. 

## 2022-08-11 ENCOUNTER — Encounter: Payer: Medicare Other | Attending: Cardiology | Admitting: *Deleted

## 2022-08-11 DIAGNOSIS — I5022 Chronic systolic (congestive) heart failure: Secondary | ICD-10-CM | POA: Diagnosis present

## 2022-08-11 NOTE — Progress Notes (Signed)
Daily Session Note  Patient Details  Name: Antonio Clark MRN: [id number] Date of Birth: 08/22/1935 Referring Provider:   Flowsheet Row Cardiac Rehab from 06/25/2022 in [facility] Cardiac and Pulmonary Rehab  Referring Provider McLean, Dalton MD       Encounter Date: 08/11/2022  Check In:  Session Check In - 08/11/22 1059       Check-In   Supervising physician immediately available to respond to emergencies See telemetry face sheet for immediately available ER MD    Location [facility]-Cardiac & Pulmonary Rehab    Staff Present Meredith Craven, RN BSN;Noah Tickle, BS, Exercise Physiologist;Jessica Hawkins, MA, RCEP, CCRP, CCET;Kara Williamson, MS, ACSM CEP, Exercise Physiologist    Virtual Visit No    Medication changes reported     No    Fall or balance concerns reported    No    Warm-up and Cool-down Performed on first and last piece of equipment    Resistance Training Performed Yes    VAD Patient? No    PAD/SET Patient? No      Pain Assessment   Currently in Pain? No/denies                Social History   Tobacco Use  Smoking Status Never  Smokeless Tobacco Never    Goals Met:  Independence with exercise equipment Exercise tolerated well No report of concerns or symptoms today Strength training completed today  Goals Unmet:  Not Applicable  Comments: Pt able to follow exercise prescription today without complaint.  Will continue to monitor for progression.    Dr. Mark Miller is Medical Director for [facility].  Dr. Fuad Aleskerov is Medical Director for [facility] Pulmonary Rehabilitation. 

## 2022-08-13 ENCOUNTER — Encounter: Payer: Medicare Other | Admitting: *Deleted

## 2022-08-13 DIAGNOSIS — I5022 Chronic systolic (congestive) heart failure: Secondary | ICD-10-CM | POA: Diagnosis not present

## 2022-08-13 NOTE — Progress Notes (Signed)
Daily Session Note  Patient Details  Name: Antonio Clark MRN: [id number] Date of Birth: [birth year] Referring Provider:   Flowsheet Row Cardiac Rehab from 06/25/2022 in [facility] Cardiac and Pulmonary Rehab  Referring Provider Loralie Champagne MD       Encounter Date: 08/13/2022  Check In:  Session Check In - 08/13/22 1121       Check-In   Supervising physician immediately available to respond to emergencies See telemetry face sheet for immediately available ER MD    Location [facility]-Cardiac & Pulmonary Rehab    Staff Present Darlyne Russian, RN, Lorin Mercy, MS, ACSM CEP, Exercise Physiologist;Joseph Tessie Fass, Cristopher Estimable, RN BSN    Virtual Visit No    Medication changes reported     No    Fall or balance concerns reported    No    Warm-up and Cool-down Performed on first and last piece of equipment    Resistance Training Performed Yes    VAD Patient? No    PAD/SET Patient? No      Pain Assessment   Currently in Pain? No/denies                Social History   Tobacco Use  Smoking Status Never  Smokeless Tobacco Never    Goals Met:  Independence with exercise equipment Exercise tolerated well No report of concerns or symptoms today Strength training completed today  Goals Unmet:  Not Applicable  Comments: Pt able to follow exercise prescription today without complaint.  Will continue to monitor for progression.    Dr. Emily Filbert is Medical Director for [place].  Dr. Ottie Glazier is Medical Director for [facility] Pulmonary Rehabilitation.

## 2022-08-19 ENCOUNTER — Ambulatory Visit (HOSPITAL_COMMUNITY)
Admission: RE | Admit: 2022-08-19 | Discharge: 2022-08-19 | Disposition: A | Discharge status: Home / Self Care | Payer: Medicare Other | Source: Ambulatory Visit | Attending: Cardiology | Admitting: Cardiology

## 2022-08-19 ENCOUNTER — Encounter: Payer: Self-pay | Admitting: Family Medicine

## 2022-08-19 DIAGNOSIS — I5022 Chronic systolic (congestive) heart failure: Secondary | ICD-10-CM | POA: Diagnosis not present

## 2022-08-19 LAB — BASIC METABOLIC PANEL
Anion gap: 12 (ref 5–15)
BUN: 31 mg/dL — ABNORMAL HIGH (ref 8–23)
CO2: 21 mmol/L — ABNORMAL LOW (ref 22–32)
Calcium: 9.4 mg/dL (ref 8.9–10.3)
Chloride: 104 mmol/L (ref 98–111)
Creatinine, Ser: 2.17 mg/dL — ABNORMAL HIGH (ref 0.61–1.24)
GFR, Estimated: 29 mL/min — ABNORMAL LOW (ref >60)
Glucose, Bld: 169 mg/dL — ABNORMAL HIGH (ref 70–99)
Potassium: 4.8 mmol/L (ref 3.5–5.1)
Sodium: 137 mmol/L (ref 135–145)

## 2022-08-20 ENCOUNTER — Encounter: Payer: Medicare Other | Admitting: *Deleted

## 2022-08-20 DIAGNOSIS — I5022 Chronic systolic (congestive) heart failure: Secondary | ICD-10-CM | POA: Diagnosis not present

## 2022-08-20 NOTE — Progress Notes (Signed)
Daily Session Note  Patient Details  Name: Antonio Clark MRN: [id number] Date of Birth: 1935/11/20 Referring Provider:   Flowsheet Row Cardiac Rehab from 06/25/2022 in [facility] Cardiac and Pulmonary Rehab  Referring Provider Loralie Champagne MD       Encounter Date: 08/20/2022  Check In:  Session Check In - 08/20/22 1107       Check-In   Supervising physician immediately available to respond to emergencies See telemetry face sheet for immediately available ER MD    Location [facility]-Cardiac & Pulmonary Rehab    Staff Present Darlyne Russian, RN, ADN;Meredith Sherryll Burger, RN Abel Presto, MS, ACSM CEP, Exercise Physiologist;Joseph Tessie Fass, [place]    Virtual Visit No    Medication changes reported     No    Fall or balance concerns reported    No    Warm-up and Cool-down Performed on first and last piece of equipment    Resistance Training Performed Yes    VAD Patient? No    PAD/SET Patient? No      Pain Assessment   Currently in Pain? No/denies                Social History   Tobacco Use  Smoking Status Never  Smokeless Tobacco Never    Goals Met:  Independence with exercise equipment Exercise tolerated well No report of concerns or symptoms today Strength training completed today  Goals Unmet:  Not Applicable  Comments: Pt able to follow exercise prescription today without complaint.  Will continue to monitor for progression.    Dr. Emily Filbert is Medical Director for [place].  Dr. Ottie Glazier is Medical Director for [facility] Pulmonary Rehabilitation.

## 2022-08-25 ENCOUNTER — Encounter: Payer: Medicare Other | Admitting: *Deleted

## 2022-08-25 DIAGNOSIS — I5022 Chronic systolic (congestive) heart failure: Secondary | ICD-10-CM

## 2022-08-25 NOTE — Progress Notes (Signed)
Daily Session Note  Patient Details  Name: JAEVEON ASHLAND MRN: [id number] Date of Birth: August 12, 1935 Referring Provider:   Flowsheet Row Cardiac Rehab from 06/25/2022 in [facility] Cardiac and Pulmonary Rehab  Referring Provider Loralie Champagne MD       Encounter Date: 08/25/2022  Check In:  Session Check In - 08/25/22 1059       Check-In   Supervising physician immediately available to respond to emergencies See telemetry face sheet for immediately available ER MD    Staff Present Renita Papa, RN BSN;Noah Tickle, BS, Exercise Physiologist;Jessica Luan Pulling, MA, RCEP, CCRP, CCET    Virtual Visit No    Medication changes reported     No    Fall or balance concerns reported    No    Warm-up and Cool-down Performed on first and last piece of equipment    Resistance Training Performed Yes    VAD Patient? No    PAD/SET Patient? No                Social History   Tobacco Use  Smoking Status Never  Smokeless Tobacco Never    Goals Met:  Independence with exercise equipment Exercise tolerated well No report of concerns or symptoms today Strength training completed today  Goals Unmet:  Not Applicable  Comments: Pt able to follow exercise prescription today without complaint.  Will continue to monitor for progression.    Dr. Emily Filbert is Medical Director for [place].  Dr. Ottie Glazier is Medical Director for [facility] Pulmonary Rehabilitation.

## 2022-08-27 ENCOUNTER — Encounter: Payer: Medicare Other | Admitting: *Deleted

## 2022-08-27 ENCOUNTER — Other Ambulatory Visit: Payer: Self-pay | Admitting: Cardiology

## 2022-08-27 ENCOUNTER — Other Ambulatory Visit: Payer: Self-pay | Admitting: Family Medicine

## 2022-08-27 DIAGNOSIS — I480 Paroxysmal atrial fibrillation: Secondary | ICD-10-CM

## 2022-08-27 NOTE — Telephone Encounter (Signed)
Eliquis 2.'5mg'$  refill request received. Patient is 87 years old, weight-79.4kg, Crea-2.17 on 08/19/2022, Diagnosis-Afib, and last seen by Dr. Aundra Dubin on 07/22/2022. Dose is appropriate based on dosing criteria. Will send in refill to requested pharmacy.

## 2022-08-27 NOTE — Telephone Encounter (Signed)
Requested medication (s) are due for refill today: yes   Requested medication (s) are on the active medication list: yes   Last refill:  04/14/22 #30 1 refills  Future visit scheduled: no   Notes to clinic:  not delegated per protocol. Do you want to refill Rx? Last OV 05/12/22     Requested Prescriptions  Pending Prescriptions Disp Refills   LORazepam (ATIVAN) 0.5 MG tablet [Pharmacy Med Name: LORAZEPAM 0.5 MG TABLET] 30 tablet 1    Sig: TAKE 1 TABLET BY MOUTH TWICE A DAY AS NEEDED FOR ANXIETY     Not Delegated - Psychiatry: Anxiolytics/Hypnotics 2 Failed - 08/27/2022  2:16 PM      Failed - This refill cannot be delegated      Failed - Urine Drug Screen completed in last 360 days      Failed - Valid encounter within last 6 months    Recent Outpatient Visits           8 months ago TMJ (temporomandibular joint syndrome)   [place] Susy Frizzle, MD   10 months ago [place] Dennard Schaumann, Cammie Mcgee, MD   1 year ago [place] Dennard Schaumann, Cammie Mcgee, MD   1 year ago Balance problem   [place] Eulogio Bear, NP   1 year ago Persistent atrial fibrillation ([place])   [place], Cammie Mcgee, MD              Passed - Patient is not pregnant      Signed Prescriptions Disp Refills   linagliptin (TRADJENTA) 5 MG TABS tablet 90 tablet 0    Sig: TAKE 1 TABLET (5 MG TOTAL) BY MOUTH DAILY.     Endocrinology:  Diabetes - DPP-4 Inhibitors - linagliptin Failed - 08/27/2022  2:16 PM      Failed - HBA1C is between 0 and 7.9 and within 180 days    Hgb A1c MFr Bld  Date Value Ref Range Status  02/09/2022 6.4 (H) 4.8 - 5.6 % Final    Comment:    (NOTE)         Prediabetes: 5.7 - 6.4         Diabetes: >6.4         Glycemic control for adults with diabetes: <7.0          Failed - Valid encounter within last 6 months    Recent Outpatient Visits           8 months  ago TMJ (temporomandibular joint syndrome)   [place] Susy Frizzle, MD   10 months ago [place] Dennard Schaumann, Cammie Mcgee, MD   1 year ago [place] Dennard Schaumann, Cammie Mcgee, MD   1 year ago Balance problem   [place] Eulogio Bear, NP   1 year ago Persistent atrial fibrillation ([place])   [facility] [place], Cammie Mcgee, MD

## 2022-08-27 NOTE — Telephone Encounter (Signed)
Last OV 05/12/22.  Requested Prescriptions  Pending Prescriptions Disp Refills   LORazepam (ATIVAN) 0.5 MG tablet [Pharmacy Med Name: LORAZEPAM 0.5 MG TABLET] 30 tablet 1    Sig: TAKE 1 TABLET BY MOUTH TWICE A DAY AS NEEDED FOR ANXIETY     Not Delegated - Psychiatry: Anxiolytics/Hypnotics 2 Failed - 08/27/2022  2:16 PM      Failed - This refill cannot be delegated      Failed - Urine Drug Screen completed in last 360 days      Failed - Valid encounter within last 6 months    Recent Outpatient Visits           8 months ago TMJ (temporomandibular joint syndrome)   [place] Susy Frizzle, MD   10 months ago [place] Dennard Schaumann, Cammie Mcgee, MD   1 year ago [place] Dennard Schaumann, Cammie Mcgee, MD   1 year ago Balance problem   [place] Eulogio Bear, NP   1 year ago Persistent atrial fibrillation ([place])   [place], Cammie Mcgee, MD              Passed - Patient is not pregnant       TRADJENTA 5 MG TABS tablet [Pharmacy Med Name: TRADJENTA 5 MG TABLET] 90 tablet 0    Sig: TAKE 1 TABLET (5 MG TOTAL) BY MOUTH DAILY.     Endocrinology:  Diabetes - DPP-4 Inhibitors - linagliptin Failed - 08/27/2022  2:16 PM      Failed - HBA1C is between 0 and 7.9 and within 180 days    Hgb A1c MFr Bld  Date Value Ref Range Status  02/09/2022 6.4 (H) 4.8 - 5.6 % Final    Comment:    (NOTE)         Prediabetes: 5.7 - 6.4         Diabetes: >6.4         Glycemic control for adults with diabetes: <7.0          Failed - Valid encounter within last 6 months    Recent Outpatient Visits           8 months ago TMJ (temporomandibular joint syndrome)   [place] Susy Frizzle, MD   10 months ago [place] Dennard Schaumann, Cammie Mcgee, MD   1 year ago [place] Dennard Schaumann, Cammie Mcgee, MD   1 year ago Balance  problem   [place] Eulogio Bear, NP   1 year ago Persistent atrial fibrillation ([place])   [facility] [place], Cammie Mcgee, MD

## 2022-08-31 ENCOUNTER — Telehealth: Payer: Self-pay

## 2022-08-31 NOTE — Telephone Encounter (Signed)
Put Faxes up front to be fax:  copies to w/OV note to [facility] re: Pt diabetic supply

## 2022-09-01 ENCOUNTER — Encounter: Payer: Medicare Other | Admitting: *Deleted

## 2022-09-01 DIAGNOSIS — I5022 Chronic systolic (congestive) heart failure: Secondary | ICD-10-CM | POA: Diagnosis not present

## 2022-09-01 NOTE — Progress Notes (Signed)
Daily Session Note  Patient Details  Name: Antonio Clark MRN: [id number] Date of Birth: 12-17-1935 Referring Provider:   Flowsheet Row Cardiac Rehab from 06/25/2022 in [facility]  Referring Provider Loralie Champagne MD       Encounter Date: 09/01/2022  Check In:  Session Check In - 09/01/22 1103       Check-In   Supervising physician immediately available to respond to emergencies See telemetry face sheet for immediately available ER MD    Location [facility]iac & Pul[facility]    Staff Present Renita Papa, RN BSN;Noah Tickle, BS, Exercise Physiologist;Kara Maricela Bo, MS, ACSM CEP, Exercise Physiologist    Virtual Visit No    Medication changes reported     No    Fall or balance concerns reported    No    Warm-up and Cool-down Performed on first and last piece of equipment    Resistance Training Performed Yes    VAD Patient? No    PAD/SET Patient? No      Pain Assessment   Currently in Pain? No/denies                Social History   Tobacco Use  Smoking Status Never  Smokeless Tobacco Never    Goals Met:  Independence with exercise equipment Exercise tolerated well No report of concerns or symptoms today Strength training completed today  Goals Unmet:  Not Applicable  Comments: Pt able to follow exercise prescription today without complaint.  Will continue to monitor for progression.    Dr. Emily Filbert is Medical Director for [facility].  Dr. Ottie Glazier is Medical Director for [facility] Pulmonary [facility].

## 2022-09-02 ENCOUNTER — Encounter: Payer: Self-pay | Admitting: *Deleted

## 2022-09-02 DIAGNOSIS — I5022 Chronic systolic (congestive) heart failure: Secondary | ICD-10-CM

## 2022-09-02 NOTE — Progress Notes (Signed)
Cardiac Individual Treatment Plan  Patient Details  Name: SADLER MALINAK MRN: [id number] Date of Birth: 1936-04-03 Referring Provider:   Flowsheet Row Cardiac Rehab from 06/25/2022 in [facility] Cardiac and Pulmonary Rehab  Referring Provider Loralie Champagne MD       Initial Encounter Date:  Flowsheet Row Cardiac Rehab from 06/25/2022 in [facility] Cardiac and Pulmonary Rehab  Date 06/25/22       Visit Diagnosis: Systolic heart failure, chronic ([place])  Patient's Home Medications on Admission:  Current Outpatient Medications:    acetaminophen (TYLENOL) 325 MG tablet, Take 2 tablets (650 mg total) by mouth every 6 (six) hours as needed for mild pain (or Fever >/= 101)., Disp: , Rfl:    amiodarone (PACERONE) 200 MG tablet, Take 0.5 tablets (100 mg total) by mouth daily., Disp: 45 tablet, Rfl: 3   atorvastatin (LIPITOR) 80 MG tablet, Take 1 tablet (80 mg total) by mouth daily., Disp: 90 tablet, Rfl: 3   ELIQUIS 2.5 MG TABS tablet, TAKE 1 TABLET BY MOUTH TWICE A DAY, Disp: 180 tablet, Rfl: 1   empagliflozin (JARDIANCE) 10 MG TABS tablet, Take 1 tablet (10 mg total) by mouth daily before breakfast., Disp: 90 tablet, Rfl: 3   finasteride (PROSCAR) 5 MG tablet, Take 5 mg by mouth daily., Disp: , Rfl:    fluticasone (FLONASE) 50 MCG/ACT nasal spray, Place 1 spray into both nostrils daily as needed for allergies or rhinitis., Disp: , Rfl:    furosemide (LASIX) 40 MG tablet, Take 0.5 tablets (20 mg total) by mouth daily., Disp: 90 tablet, Rfl: 3   linagliptin (TRADJENTA) 5 MG TABS tablet, TAKE 1 TABLET (5 MG TOTAL) BY MOUTH DAILY., Disp: 90 tablet, Rfl: 0   LORazepam (ATIVAN) 0.5 MG tablet, TAKE 1 TABLET BY MOUTH TWICE A DAY AS NEEDED FOR ANXIETY, Disp: 30 tablet, Rfl: 1   metoprolol succinate (TOPROL-XL) 50 MG 24 hr tablet, Take 0.5 tablets (25 mg total) by mouth at bedtime. Take with or immediately following a meal., Disp: 30 tablet, Rfl: 5   nitroGLYCERIN (NITROSTAT) 0.4 MG SL tablet, Place 1 tablet (0.4  mg total) under the tongue every 5 (five) minutes x 3 doses as needed for chest pain., Disp: 25 tablet, Rfl: 12   ondansetron (ZOFRAN) 4 MG tablet, Take 1 tablet (4 mg total) by mouth every 6 (six) hours as needed for nausea., Disp: 20 tablet, Rfl: 0   oxymetazoline (AFRIN) 0.05 % nasal spray, Place 1 spray into both nostrils as needed for congestion., Disp: , Rfl:    spironolactone (ALDACTONE) 25 MG tablet, Take 1 tablet (25 mg total) by mouth at bedtime., Disp: 45 tablet, Rfl: 3   traZODone (DESYREL) 50 MG tablet, TAKE 0.5 TABLETS BY MOUTH AT BEDTIME AS NEEDED FOR SLEEP., Disp: 45 tablet, Rfl: 3  Past Medical History: Past Medical History:  Diagnosis Date   Anal fissure    Arthritis    Atrial fibrillation (HCC)    Back pain    CHF (congestive heart failure) (HCC)    Colon polyps    Coronary artery disease    Diabetes mellitus type 2 with complications ([place])    Dysrhythmia    a-fib   GERD (gastroesophageal reflux disease)    History of echocardiogram 07/ 07/ 2011   History of lithotripsy 1989   Hyperlipidemia    Hypertension    Kidney stones    Melanoma ([place]) 05/01/2019   right chest wall (12/20)   Prostate CA ([place]) 2010   Sleep apnea  uses CPAP nightly   Squamous cell carcinoma of skin 05/23/1992   bowens-left parietal scalp ([id number])   Squamous cell carcinoma of skin 03/14/2008   in situ-left upper outer forehead-medial ([id number])   Squamous cell carcinoma of skin 03/14/2008   in situ-crown of scalp ([id number])   Squamous cell carcinoma of skin 04/15/2011   in situ-right dorsal forearm (txpbx)   Squamous cell carcinoma of skin 10/16/2011   in situ-left sideburn   Squamous cell carcinoma of skin 03/11/2015   ka-left sideburn ([id number])   Squamous cell carcinoma of skin 03/11/2015   ka-left forearm ([id number])   Squamous cell carcinoma of skin 06/22/2018   in situ-left forearm, sup (txpbx)   Squamous cell carcinoma of skin 05/01/2019   in situ-mid anterior scalp    Squamous  cell carcinoma of skin 05/01/2019   in situ-right upper arm    Tobacco Use: Social History   Tobacco Use  Smoking Status Never  Smokeless Tobacco Never    Labs: Review Flowsheet  More data exists      Latest Ref Rng & Units 12/17/2020 05/05/2021 10/30/2021 02/09/2022 05/28/2022  Labs for ITP Cardiac and Pulmonary Rehab  Cholestrol 0 - 200 mg/dL 112  108  103  - 142   LDL (calc) 0 - 99 mg/dL 51  45  38  - 54   HDL-C >40 mg/dL 37  43  48  - 55   Trlycerides <150 mg/dL 158  116  83  - 167   Hemoglobin A1c 4.8 - 5.6 % 6.7  6.6  6.3  6.4  -     Exercise Target Goals: Exercise Program Goal: Individual exercise prescription set using results from initial 6 min walk test and THRR while considering  patient's activity barriers and safety.   Exercise Prescription Goal: Initial exercise prescription builds to 30-45 minutes a day of aerobic activity, 2-3 days per week.  Home exercise guidelines will be given to patient during program as part of exercise prescription that the participant will acknowledge.   Education: Aerobic Exercise: - Group verbal and visual presentation on the components of exercise prescription. Introduces F.I.T.T principle from ACSM for exercise prescriptions.  Reviews F.I.T.T. principles of aerobic exercise including progression. Written material given at graduation.   Education: Resistance Exercise: - Group verbal and visual presentation on the components of exercise prescription. Introduces F.I.T.T principle from ACSM for exercise prescriptions  Reviews F.I.T.T. principles of resistance exercise including progression. Written material given at graduation.    Education: Exercise & Equipment Safety: - Individual verbal instruction and demonstration of equipment use and safety with use of the equipment. Flowsheet Row Cardiac Rehab from 08/20/2022 in [facility] Cardiac and Pulmonary Rehab  Education need identified 06/25/22  Date 06/25/22  Educator [facility]  Instruction Review  Code 1- Verbalizes Understanding       Education: Exercise Physiology & General Exercise Guidelines: - Group verbal and written instruction with models to review the exercise physiology of the cardiovascular system and associated critical values. Provides general exercise guidelines with specific guidelines to those with heart or lung disease.  Flowsheet Row Cardiac Rehab from 08/20/2022 in [facility] Cardiac and Pulmonary Rehab  Education need identified 06/25/22  Date 08/20/22  Educator [facility]  Instruction Review Code 1- [employer] Understanding       Education: Flexibility, Balance, Mind/Body Relaxation: - Group verbal and visual presentation with interactive activity on the components of exercise prescription. Introduces F.I.T.T principle from ACSM for exercise prescriptions. Reviews F.I.T.T. principles of flexibility and balance exercise  training including progression. Also discusses the mind body connection.  Reviews various relaxation techniques to help reduce and manage stress (i.e. Deep breathing, progressive muscle relaxation, and visualization). Balance handout provided to take home. Written material given at graduation.   Activity Barriers & Risk Stratification:  Activity Barriers & Cardiac Risk Stratification - 06/25/22 1612       Activity Barriers & Cardiac Risk Stratification   Activity Barriers Balance Concerns;Back Problems;Arthritis;Assistive Device;Deconditioning   walker/cane as needed but has not used in a while   Cardiac Risk Stratification High             6 Minute Walk:  6 Minute Walk     Row Name 06/25/22 1613         6 Minute Walk   Phase Initial     Distance 985 feet     Walk Time 6 minutes     # of Rest Breaks 0     MPH 1.86     METS 1.67     RPE 12     Perceived Dyspnea  0     VO2 Peak 5.85     Symptoms No     Resting HR 84 bpm     Resting BP 120/66     Resting Oxygen Saturation  99 %     Exercise Oxygen Saturation  during 6 min walk 97 %      Max Ex. HR 94 bpm     Max Ex. BP 130/62     2 Minute Post BP 122/68              Oxygen Initial Assessment:   Oxygen Re-Evaluation:   Oxygen Discharge (Final Oxygen Re-Evaluation):   Initial Exercise Prescription:  Initial Exercise Prescription - 06/25/22 1600       Date of Initial Exercise RX and Referring Provider   Date 06/25/22    Referring Provider Loralie Champagne MD      Recumbant Bike   Level 1    RPM 60    Watts 18    Minutes 15    METs 1.6      NuStep   Level 1    SPM 80    Minutes 15    METs 1.6      T5 Nustep   Level 1    SPM 80    Minutes 15    METs 1.6      Track   Laps 20    Minutes 15    METs 2.09      Prescription Details   Frequency (times per week) 2    Duration Progress to 30 minutes of continuous aerobic without signs/symptoms of physical distress      Intensity   THRR 40-80% of Max Heartrate 104 - 125    Ratings of Perceived Exertion 11-13    Perceived Dyspnea 0-4      Progression   Progression Continue to progress workloads to maintain intensity without signs/symptoms of physical distress.      Resistance Training   Training Prescription Yes    Weight 3 lb    Reps 10-15             Perform Capillary Blood Glucose checks as needed.  Exercise Prescription Changes:   Exercise Prescription Changes     Row Name 06/25/22 1600 07/16/22 1500 07/28/22 1100 07/28/22 1400 08/13/22 0900     Response to Exercise   Blood Pressure (Admit) 120/66 120/62 -- 116/62 112/60   Blood  Pressure (Exercise) 130/62 142/64 -- 128/62 142/72   Blood Pressure (Exit) 122/68 126/64 -- 110/64 124/64   Heart Rate (Admit) 84 bpm 68 bpm -- 69 bpm 74 bpm   Heart Rate (Exercise) 94 bpm 119 bpm -- 117 bpm 113 bpm   Heart Rate (Exit) 79 bpm 64 bpm -- 78 bpm 91 bpm   Oxygen Saturation (Admit) 99 % -- -- -- --   Oxygen Saturation (Exercise) 97 % -- -- -- --   Oxygen Saturation (Exit) 99 % -- -- -- --   Rating of Perceived Exertion (Exercise) 12 14  -- 15 15   Perceived Dyspnea (Exercise) 0 -- -- -- --   Symptoms none none -- none none   Comments walk test results First three rehab sessions -- -- --   Duration -- Continue with 30 min of aerobic exercise without signs/symptoms of physical distress. -- Continue with 30 min of aerobic exercise without signs/symptoms of physical distress. Continue with 30 min of aerobic exercise without signs/symptoms of physical distress.   Intensity -- THRR unchanged -- THRR unchanged THRR unchanged     Progression   Progression -- Continue to progress workloads to maintain intensity without signs/symptoms of physical distress. -- Continue to progress workloads to maintain intensity without signs/symptoms of physical distress. Continue to progress workloads to maintain intensity without signs/symptoms of physical distress.   Average METs -- 2.55 -- 2.63 2.71     Resistance Training   Training Prescription -- Yes -- Yes Yes   Weight -- 3 lb -- 3 lb 3 lb   Reps -- 10-15 -- 10-15 10-15     Interval Training   Interval Training -- No -- No No     Treadmill   MPH -- -- -- 2.5 2.5   Grade -- -- -- 1 1   Minutes -- -- -- 15 15   METs -- -- -- 3.26 3.26     Recumbant Bike   Level -- 1 -- 1.5 7   Watts -- 16 -- -- 25   Minutes -- 15 -- 15 15   METs -- 3.02 -- 1.7 --     NuStep   Level -- 4 -- 5 5   Minutes -- 15 -- 15 15   METs -- 2.8 -- 3.2 3.6     Recumbant Elliptical   Level -- -- -- -- 1.5   Minutes -- -- -- -- 15   METs -- -- -- -- 1.6     T5 Nustep   Level -- 1 -- 3 4   Minutes -- 15 -- 15 15   METs -- 1.9 -- 2 2.1     Track   Laps -- 30 -- 30 --   Minutes -- 15 -- 15 --   METs -- 2.63 -- 2.63 --     Home Exercise Plan   Plans to continue exercise at -- -- [employer] [facility] (comment)  Antonio Clark plans to walk at home, and go to the [facility] to use [facility] aerobic machines. [facility] ([facility]  [place] plans to walk at home, and go to [employer] to use their aerobic machines.  [facility] (comment)  [place] plans to walk at home, and go to [employer] to use their aerobic machines.   Frequency -- -- Add 3 additional days to program exercise sessions. Add 3 additional days to program exercise sessions. Add 3 additional days to program exercise sessions.   Initial Home Exercises Provided -- -- 07/28/22  07/28/22 07/28/22     Oxygen   Maintain Oxygen Saturation -- 88% or higher 88% or higher 88% or higher 88% or higher    Row Name 08/25/22 1500             Response to Exercise   Blood Pressure (Admit) 124/72       Blood Pressure (Exit) 120/70       Heart Rate (Admit) 71 bpm       Heart Rate (Exercise) 96 bpm       Heart Rate (Exit) 79 bpm       Rating of Perceived Exertion (Exercise) 14       Symptoms none       Duration Continue with 30 min of aerobic exercise without signs/symptoms of physical distress.       Intensity THRR unchanged         Progression   Progression Continue to progress workloads to maintain intensity without signs/symptoms of physical distress.       Average METs 2.64         Resistance Training   Training Prescription Yes       Weight 3 lb       Reps 10-15         Interval Training   Interval Training No         Treadmill   MPH 2.1       Grade 0.5       Minutes 15       METs 2.75         Recumbant Bike   Level 7       Watts 25       Minutes 15       METs 3         NuStep   Level 5       Minutes 15       METs 3.6         T5 Nustep   Level 5       Minutes 15       METs 2.1         Home Exercise Plan   Plans to continue exercise at [employer] (comment)  Antonio Clark plans to walk at home, and go to the [facility] to use their aerobic machines.       Frequency Add 3 additional days to program exercise sessions.       Initial Home Exercises Provided 07/28/22         Oxygen   Maintain Oxygen Saturation 88% or higher                Exercise Comments:   Exercise Comments     Row Name 06/30/22 1056            Exercise Comments First full day of exercise!  Patient was oriented to gym and equipment including functions, settings, policies, and procedures.  Patient's individual exercise prescription and treatment plan were reviewed.  All starting workloads were established based on the results of the 6 minute walk test done at initial orientation visit.  The plan for exercise progression was also introduced and progression will be customized based on patient's performance and goals.                Exercise Goals and Review:   Exercise Goals     Row Name 06/25/22 1621             Exercise Goals   Increase Physical  Activity Yes       Intervention Provide advice, education, support and counseling about physical activity/exercise needs.;Develop an individualized exercise prescription for aerobic and resistive training based on initial evaluation findings, risk stratification, comorbidities and participant's personal goals.       Expected Outcomes Short Term: Attend rehab on a regular basis to increase amount of physical activity.;Long Term: Add in home exercise to make exercise part of routine and to increase amount of physical activity.;Long Term: Exercising regularly at least 3-5 days a week.       Increase Strength and Stamina Yes       Intervention Provide advice, education, support and counseling about physical activity/exercise needs.;Develop an individualized exercise prescription for aerobic and resistive training based on initial evaluation findings, risk stratification, comorbidities and participant's personal goals.       Expected Outcomes Short Term: Increase workloads from initial exercise prescription for resistance, speed, and METs.;Short Term: Perform resistance training exercises routinely during rehab and add in resistance training at home;Long Term: Improve cardiorespiratory fitness, muscular endurance and strength as measured by increased METs and functional capacity (6MWT)        Able to understand and use rate of perceived exertion (RPE) scale Yes       Intervention Provide education and explanation on how to use RPE scale       Expected Outcomes Short Term: Able to use RPE daily in rehab to express subjective intensity level;Long Term:  Able to use RPE to guide intensity level when exercising independently       Able to understand and use Dyspnea scale Yes       Intervention Provide education and explanation on how to use Dyspnea scale       Expected Outcomes Short Term: Able to use Dyspnea scale daily in rehab to express subjective sense of shortness of breath during exertion;Long Term: Able to use Dyspnea scale to guide intensity level when exercising independently       Knowledge and understanding of Target Heart Rate Range (THRR) Yes       Intervention Provide education and explanation of THRR including how the numbers were predicted and where they are located for reference       Expected Outcomes Short Term: Able to state/look up THRR;Long Term: Able to use THRR to govern intensity when exercising independently;Short Term: Able to use daily as guideline for intensity in rehab       Able to check pulse independently Yes       Intervention Provide education and demonstration on how to check pulse in carotid and radial arteries.;Review the importance of being able to check your own pulse for safety during independent exercise       Expected Outcomes Long Term: Able to check pulse independently and accurately;Short Term: Able to explain why pulse checking is important during independent exercise       Understanding of Exercise Prescription Yes       Intervention Provide education, explanation, and written materials on patient's individual exercise prescription       Expected Outcomes Short Term: Able to explain program exercise prescription;Long Term: Able to explain home exercise prescription to exercise independently                Exercise Goals  Re-Evaluation :  Exercise Goals Re-Evaluation     Row Name 06/30/22 1057 07/16/22 1518 07/28/22 1131 07/28/22 1500 08/13/22 1000     Exercise Goal Re-Evaluation   Exercise Goals Review Increase Physical Activity;Able  to understand and use rate of perceived exertion (RPE) scale;Knowledge and understanding of Target Heart Rate Range (THRR);Understanding of Exercise Prescription;Increase Strength and Stamina;Able to check pulse independently Increase Physical Activity;Understanding of Exercise Prescription;Increase Strength and Stamina Increase Physical Activity;Understanding of Exercise Prescription;Increase Strength and Stamina;Able to check pulse independently;Knowledge and understanding of Target Heart Rate Range (THRR);Able to understand and use Dyspnea scale;Able to understand and use rate of perceived exertion (RPE) scale Increase Physical Activity;Increase Strength and Stamina;Understanding of Exercise Prescription Increase Physical Activity;Increase Strength and Stamina;Understanding of Exercise Prescription   Comments Reviewed RPE scale, THR and program prescription with pt today.  Pt voiced understanding and was given a copy of goals to take home. Antonio Clark is off to a good start in rehab. He had an overall average MET level of 2.55 METs during his first three sessions. He also was able to walk up to 30 laps on the track and worked up to level 4 on the T4. He did well with 3 lb hand weights for resistance training as well. We will continue to monitor his progress in the program. Reviewed home exercise with pt today.  Pt plans to walk at home, as well as go to the [facility] and use their aerobic machines for exercise.  Reviewed THR, pulse, RPE, sign and symptoms, pulse oximetery and when to call 911 or MD.  Also discussed weather considerations and indoor options.  Pt voiced understanding. Antonio Clark continues to do well in rehab. He did increase to level 5 on the T4 nustep and also was able to use the  treadmill for the first time which he did well, working over 3.2 METS! He is staying steady at 30 laps on the track and would beenfit from increasing beyond that. We will continue to monitor. Antonio Clark is doing well in rehab. He increased his overall average MET level to 2.71 METs. He also tried increasing his speed on the treadmill to 2.6 mph with no incline, but he rated it as hard on the RPE scale. He was able to improve to level 7 on the recumbent bike and level 4 on the T5 as well. We will continue to monitor his progress in the program.   Expected Outcomes Short: Use RPE daily to regulate intensity.  Long: Follow program prescription in THR. Short: Continue to follow current exercise prescription. Long: Continue to increase strength and stamina. Short: Begin to walk at home on days away from rehab. Long: Continue to exercise independently. Short: Increase number of laps on track over 30 Long: Continue to increase overall MET level Short: Continue to increase workload on treadmill. Long: Continue to increase strength and stamina.    [place] Name 08/25/22 1518             Exercise Goal Re-Evaluation   Exercise Goals Review Increase Physical Activity;Increase Strength and Stamina;Understanding of Exercise Prescription       Comments Antonio Clark continues to do well in rehab.  His workload on the treadmill is a little inconsistent ans staff will remind patient to keep it consistent, however, he was able to work at a 2.1 mph with a 0.5% incline. He did increase to level 5 on the T5 Nustep. RPEs are staying appropriate. Patient would also benefit from increasing to 4 lb for handweights. Will continue to monitor.       Expected Outcomes Short: Increase to 4 lb handweights Long: Continue to increase overall MET level  Discharge Exercise Prescription (Final Exercise Prescription Changes):  Exercise Prescription Changes - 08/25/22 1500       Response to Exercise   Blood Pressure (Admit)  124/72    Blood Pressure (Exit) 120/70    Heart Rate (Admit) 71 bpm    Heart Rate (Exercise) 96 bpm    Heart Rate (Exit) 79 bpm    Rating of Perceived Exertion (Exercise) 14    Symptoms none    Duration Continue with 30 min of aerobic exercise without signs/symptoms of physical distress.    Intensity THRR unchanged      Progression   Progression Continue to progress workloads to maintain intensity without signs/symptoms of physical distress.    Average METs 2.64      Resistance Training   Training Prescription Yes    Weight 3 lb    Reps 10-15      Interval Training   Interval Training No      Treadmill   MPH 2.1    Grade 0.5    Minutes 15    METs 2.75      Recumbant Bike   Level 7    Watts 25    Minutes 15    METs 3      NuStep   Level 5    Minutes 15    METs 3.6      T5 Nustep   Level 5    Minutes 15    METs 2.1      Home Exercise Plan   Plans to continue exercise at [employer] (comment)   Antonio Clark plans to walk at home, and go to the [facility] to use their aerobic machines.   Frequency Add 3 additional days to program exercise sessions.    Initial Home Exercises Provided 07/28/22      Oxygen   Maintain Oxygen Saturation 88% or higher             Nutrition:  Target Goals: Understanding of nutrition guidelines, daily intake of sodium '1500mg'$ , cholesterol '200mg'$ , calories 30% from fat and 7% or less from saturated fats, daily to have 5 or more servings of fruits and vegetables.  Education: All About Nutrition: -Group instruction provided by verbal, written material, interactive activities, discussions, models, and posters to present general guidelines for heart healthy nutrition including fat, fiber, MyPlate, the role of sodium in heart healthy nutrition, utilization of the nutrition label, and utilization of this knowledge for meal planning. Follow up email sent as well. Written material given at graduation. Flowsheet Row Cardiac Rehab from 08/20/2022  in [facility] Cardiac and Pulmonary Rehab  Date 07/09/22  [Nutrition 2]  Educator [facility]  Instruction Review Code 1- Verbalizes Understanding       Biometrics:  Pre Biometrics - 06/25/22 1612       Pre Biometrics   Height 5' 8.5" (1.74 m)    Weight 169 lb 6.4 oz (76.8 kg)    Waist Circumference 41 inches    Hip Circumference 39.5 inches    Waist to Hip Ratio 1.04 %    BMI (Calculated) 25.38    Single Leg Stand 3.5 seconds              Nutrition Therapy Plan and Nutrition Goals:  Nutrition Therapy & Goals - 07/16/22 1204       Nutrition Therapy   Diet Heart healthy, low Na, T2DM MNT    Drug/Food Interactions Statins/Certain Fruits    Protein (specify units) 80-90g    Fiber 21 grams  Whole Grain Foods 3 servings    Saturated Fats 12 max. grams    Fruits and Vegetables 8 servings/day    Sodium 1.5 grams      Personal Nutrition Goals   Nutrition Goal ST: eat at least 1 protien source at each meal and snack, have a consistent meal/snack at dinner time LT: meet energy and protein needs, continue with heart healthy diet    Comments 87 y.o. M admitted to cardiac rehab s/p chronic systolic HF. PMHx includes T2DM, CAD, HTN, ,malignant neoplasm of prostate (2016), HLD. Relevant medications includes lipitor, jardiance, lasix, tradjenta, ativan, zofran, trazodone. PYP Score: 66. Vegetables & Fruits 9/12. Breads, Grains & Cereals 10/12. Red & Processed Meat 10/12. Poultry 2/2. Fish & Shellfish 1/4. Beans, Nuts & Seeds 2/4. Milk & Dairy Foods 4/6. Toppings, Oils, Seasonings & Salt 13/20. Sweets, Snacks & Restaurant Food 6/14. Beverages 9/10. B: medication with glucerna (1/2 now, 1/2 afternoon) with whole wheat toast.  L: cabbage and green beans with sometimes rice D: sometimes will not eat dinner, sometimes will go out, he eats red meat 1x/week. He also gets canned salmon to have during the week such as salmon and grits. He does not use added salt and he likes adding vinegar. He loves [place]  beans - suggested to include these with his vegetables at lunch to boost the protein. Discussed heart healthy eating and T2DM MNT.      Intervention Plan   Intervention Prescribe, educate and counsel regarding individualized specific dietary modifications aiming towards targeted core components such as weight, hypertension, lipid management, diabetes, heart failure and other comorbidities.    Expected Outcomes Short Term Goal: Understand basic principles of dietary content, such as calories, fat, sodium, cholesterol and nutrients.;Short Term Goal: A plan has been developed with personal nutrition goals set during dietitian appointment.;Long Term Goal: Adherence to prescribed nutrition plan.             Nutrition Assessments:  MEDIFICTS Score Key: ?70 Need to make dietary changes  40-70 Heart Healthy Diet ? 40 Therapeutic Level Cholesterol Diet  Flowsheet Row Cardiac Rehab from 06/25/2022 in [facility] Cardiac and Pulmonary Rehab  Picture Your Plate Total Score on Admission 66      Picture Your Plate Scores: <43 Unhealthy dietary pattern with much room for improvement. 41-50 Dietary pattern unlikely to meet recommendations for good health and room for improvement. 51-60 More healthful dietary pattern, with some room for improvement.  >60 Healthy dietary pattern, although there may be some specific behaviors that could be improved.    Nutrition Goals Re-Evaluation:  Nutrition Goals Re-Evaluation     [place] Name 08/20/22 1143             Goals   Comment Antonio Clark has added more protein to his snacks. His favorite snack is cheese crackers, and he is monitoring his intake of sodium. He reports he has been trying to eat more consistent times       Expected Outcome Short: choose heart healthy snacks in between meals. Long: independently manage heart healthy diet.                Nutrition Goals Discharge (Final Nutrition Goals Re-Evaluation):  Nutrition Goals Re-Evaluation - 08/20/22  1143       Goals   Comment Antonio Clark has added more protein to his snacks. His favorite snack is cheese crackers, and he is monitoring his intake of sodium. He reports he has been trying to eat more consistent times  Expected Outcome Short: choose heart healthy snacks in between meals. Long: independently manage heart healthy diet.             Psychosocial: Target Goals: Acknowledge presence or absence of significant depression and/or stress, maximize coping skills, provide positive support system. Participant is able to verbalize types and ability to use techniques and skills needed for reducing stress and depression.   Education: Stress, Anxiety, and Depression - Group verbal and visual presentation to define topics covered.  Reviews how body is impacted by stress, anxiety, and depression.  Also discusses healthy ways to reduce stress and to treat/manage anxiety and depression.  Written material given at graduation. Flowsheet Row Cardiac Rehab from 08/20/2022 in [facility] Cardiac and Pulmonary Rehab  Date 08/13/22  Educator [facility]  Instruction Review Code 1- [employer] Understanding       Education: Sleep Hygiene -Provides group verbal and written instruction about how sleep can affect your health.  Define sleep hygiene, discuss sleep cycles and impact of sleep habits. Review good sleep hygiene tips.    Initial Review & Psychosocial Screening:  Initial Psych Review & Screening - 06/17/22 [place]? Yes   Joelene Millin his care giver, no immediate family     Barriers   Psychosocial barriers to participate in program There are no identifiable barriers or psychosocial needs.      Screening Interventions   Interventions Encouraged to exercise;To provide support and resources with identified psychosocial needs;Provide feedback about the scores to participant    Expected Outcomes Short Term goal: Utilizing psychosocial counselor, staff and physician to  assist with identification of specific Stressors or current issues interfering with healing process. Setting desired goal for each stressor or current issue identified.;Long Term Goal: Stressors or current issues are controlled or eliminated.;Short Term goal: Identification and review with participant of any Quality of Life or Depression concerns found by scoring the questionnaire.;Long Term goal: The participant improves quality of Life and PHQ9 Scores as seen by post scores and/or verbalization of changes             Quality of Life Scores:   Quality of Life - 06/25/22 1609       Quality of Life   Select Quality of Life      Quality of Life Scores   Health/Function Pre 6.86 %    Socioeconomic Pre 30 %    Psych/Spiritual Pre 17 %    Family Pre 14 %    GLOBAL Pre 13.93 %            Scores of 19 and below usually indicate a poorer quality of life in these areas.  A difference of  2-3 points is a clinically meaningful difference.  A difference of 2-3 points in the total score of the Quality of Life Index has been associated with significant improvement in overall quality of life, self-image, physical symptoms, and general health in studies assessing change in quality of life.  PHQ-9: Review Flowsheet  More data exists      07/21/2022 06/25/2022 02/27/2022 01/20/2022 12/29/2021  Depression screen PHQ 2/9  Decreased Interest 1 1 0 0 0  Down, Depressed, Hopeless 0 1 0 0 0  PHQ - 2 Score 1 2 0 0 0  Altered sleeping 0 3 - - -  Tired, decreased energy 1 3 - - -  Change in appetite 0 0 - - -  Feeling bad or failure about yourself  0 1 - - -  Trouble concentrating 0 0 - - -  Moving slowly or fidgety/restless 0 0 - - -  Suicidal thoughts 0 1 - - -  PHQ-9 Score 2 10 - - -  Difficult doing work/chores Somewhat difficult Very difficult - - -   Interpretation of Total Score  Total Score Depression Severity:  1-4 = Minimal depression, 5-9 = Mild depression, 10-14 = Moderate depression,  15-19 = Moderately severe depression, 20-27 = Severe depression   Psychosocial Evaluation and Intervention:  Psychosocial Evaluation - 06/17/22 1602       Psychosocial Evaluation & Interventions   Interventions Encouraged to exercise with the program and follow exercise prescription    Comments Oluwaferanmi has no barriers to attending the program. He is ready to start exercising as he has not done much in the past year. He lives alone. He has support from his caregiver of 8 years,Kimberly, and distant relatives. He is attending for a diagnosis of Heart failure and he does weigh himself every day and watches for daily weight gain over 2 pounds. He knows to call his doctor if that gain occurs. He should do well with the program.    Expected Outcomes STG Le attends all scheduled sessions, he returns to a routine exercise program. LTG Lynann Bologna after discharge will continue with his exercise progression.    Continue Psychosocial Services  Follow up required by staff             Psychosocial Re-Evaluation:  Psychosocial Re-Evaluation     [place] Name 07/23/22 1114 08/20/22 1139           Psychosocial Re-Evaluation   Current issues with None Identified None Identified      Comments Antonio Clark reports enjoying the program. He feels stronger, but still wants to increase his stamina. He still is having chronic sleep issues, he doesn't like his cpap and doesn't feel like he sleeps well nightly. His MD is aware. 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 He used to golf for fun but doesn't feel up for it so now he enjoys watching movies for fun. Antonio Clark is still enjoing the program. He has noticed after the increase in his spirinolactone and attending the program more, he feels stronger and more stable. He feels his fluid balance is well controlled as well as his blood pressure. He wants to get outside more and feels more down when the weather is bad. He enjoys watching movies during the dreary weather, but  is ready for spring so he can get outiside more. He has completed his home exercise discussion with the EP and feels like he will be more ready to get started when warmer weather comes.      Expected Outcomes Short: continue to attend cardiac rehab for education and exercise. Long: maintain positive self care habits. Short: try to get outside when the weather is pretty. Long: line up self care habits for bad weather days.      Continue Psychosocial Services  Follow up required by staff Follow up required by staff               Psychosocial Discharge (Final Psychosocial Re-Evaluation):  Psychosocial Re-Evaluation - 08/20/22 1139       Psychosocial Re-Evaluation   Current issues with None Identified    Comments Antonio Clark is still enjoing the program. He has noticed after the increase in his spirinolactone and attending the program more, he feels stronger and more stable. He feels his  fluid balance is well controlled as well as his blood pressure. He wants to get outside more and feels more down when the weather is bad. He enjoys watching movies during the dreary weather, but is ready for spring so he can get outiside more. He has completed his home exercise discussion with the EP and feels like he will be more ready to get started when warmer weather comes.    Expected Outcomes Short: try to get outside when the weather is pretty. Long: line up self care habits for bad weather days.    Continue Psychosocial Services  Follow up required by staff             Vocational Rehabilitation: Provide vocational rehab assistance to qualifying candidates.   Vocational Rehab Evaluation & Intervention:   Education: Education Goals: Education classes will be provided on a variety of topics geared toward better understanding of heart health and risk factor modification. Participant will state understanding/return demonstration of topics presented as noted by education test scores.  Learning  Barriers/Preferences:   General Cardiac Education Topics:  AED/CPR: - Group verbal and written instruction with the use of models to demonstrate the basic use of the AED with the basic ABC's of resuscitation.   Anatomy and Cardiac Procedures: - Group verbal and visual presentation and models provide information about basic cardiac anatomy and function. Reviews the testing methods done to diagnose heart disease and the outcomes of the test results. Describes the treatment choices: Medical Management, Angioplasty, or Coronary Bypass Surgery for treating various heart conditions including Myocardial Infarction, Angina, Valve Disease, and Cardiac Arrhythmias.  Written material given at graduation. Flowsheet Row Cardiac Rehab from 08/20/2022 in [facility] Cardiac and Pulmonary Rehab  Education need identified 06/25/22       Medication Safety: - Group verbal and visual instruction to review commonly prescribed medications for heart and lung disease. Reviews the medication, class of the drug, and side effects. Includes the steps to properly store meds and maintain the prescription regimen.  Written material given at graduation. Flowsheet Row Cardiac Rehab from 08/20/2022 in [facility] Cardiac and Pulmonary Rehab  Date 07/23/22  Educator SB  Instruction Review Code 1- Verbalizes Understanding       Intimacy: - Group verbal instruction through game format to discuss how heart and lung disease can affect sexual intimacy. Written material given at graduation..   Know Your Numbers and Heart Failure: - Group verbal and visual instruction to discuss disease risk factors for cardiac and pulmonary disease and treatment options.  Reviews associated critical values for Overweight/Obesity, Hypertension, Cholesterol, and Diabetes.  Discusses basics of heart failure: signs/symptoms and treatments.  Introduces Heart Failure Zone chart for action plan for heart failure.  Written material given at graduation. Flowsheet  Row Cardiac Rehab from 08/20/2022 in [facility] Cardiac and Pulmonary Rehab  Date 07/30/22  Educator SB  Instruction Review Code 1- Verbalizes Understanding       Infection Prevention: - Provides verbal and written material to individual with discussion of infection control including proper hand washing and proper equipment cleaning during exercise session. Flowsheet Row Cardiac Rehab from 08/20/2022 in [facility] Cardiac and Pulmonary Rehab  Education need identified 06/25/22  Date 06/25/22  Educator [place]  Instruction Review Code 1- Verbalizes Understanding       Falls Prevention: - Provides verbal and written material to individual with discussion of falls prevention and safety. Flowsheet Row Cardiac Rehab from 06/17/2022 in [facility] Cardiac and Pulmonary Rehab  Date 06/17/22  Educator SB  Instruction Review Code 1- Verbalizes Understanding       Other: -Provides group and verbal instruction on various topics (see comments)   Knowledge Questionnaire Score:  Knowledge Questionnaire Score - 06/25/22 1603       Knowledge Questionnaire Score   Pre Score 24/26             Core Components/Risk Factors/Patient Goals at Admission:  Personal Goals and Risk Factors at Admission - 06/25/22 1621       Core Components/Risk Factors/Patient Goals on Admission    Weight Management Yes;Weight Maintenance    Intervention Weight Management: Develop a combined nutrition and exercise program designed to reach desired caloric intake, while maintaining appropriate intake of nutrient and fiber, sodium and fats, and appropriate energy expenditure required for the weight goal.;Weight Management: Provide education and appropriate resources to help participant work on and attain dietary goals.    Admit Weight 169 lb (76.7 kg)    Goal Weight: Short Term 169 lb (76.7 kg)    Goal Weight: Long Term 169 lb (76.7 kg)    Expected Outcomes Short Term: Continue to assess and modify interventions until short term  weight is achieved;Long Term: Adherence to nutrition and physical activity/exercise program aimed toward attainment of established weight goal;Weight Maintenance: Understanding of the daily nutrition guidelines, which includes 25-35% calories from fat, 7% or less cal from saturated fats, less than '200mg'$  cholesterol, less than 1.5gm of sodium, & 5 or more servings of fruits and vegetables daily;Understanding recommendations for meals to include 15-35% energy as protein, 25-35% energy from fat, 35-60% energy from carbohydrates, less than '200mg'$  of dietary cholesterol, 20-35 gm of total fiber daily;Understanding of distribution of calorie intake throughout the day with the consumption of 4-5 meals/snacks    Diabetes Yes    Intervention Provide education about signs/symptoms and action to take for hypo/hyperglycemia.;Provide education about proper nutrition, including hydration, and aerobic/resistive exercise prescription along with prescribed medications to achieve blood glucose in normal ranges: Fasting glucose 65-99 mg/dL    Expected Outcomes Short Term: Participant verbalizes understanding of the signs/symptoms and immediate care of hyper/hypoglycemia, proper foot care and importance of medication, aerobic/resistive exercise and nutrition plan for blood glucose control.;Long Term: Attainment of HbA1C < 7%.    Heart Failure Yes    Intervention Provide a combined exercise and nutrition program that is supplemented with education, support and counseling about heart failure. Directed toward relieving symptoms such as shortness of breath, decreased exercise tolerance, and extremity edema.    Expected Outcomes Improve functional capacity of life;Short term: Attendance in program 2-3 days a week with increased exercise capacity. Reported lower sodium intake. Reported increased fruit and vegetable intake. Reports medication compliance.;Short term: Daily weights obtained and reported for increase. Utilizing diuretic  protocols set by physician.;Long term: Adoption of self-care skills and reduction of barriers for early signs and symptoms recognition and intervention leading to self-care maintenance.    Hypertension Yes    Intervention Provide education on lifestyle modifcations including regular physical activity/exercise, weight management, moderate sodium restriction and increased consumption of fresh fruit, vegetables, and low fat dairy, alcohol moderation, and smoking cessation.;Monitor prescription use compliance.    Expected Outcomes Short Term: Continued assessment and intervention until BP is < 140/40m HG in hypertensive participants. < 130/812mHG in hypertensive participants with diabetes, heart failure or chronic kidney disease.;Long Term: Maintenance of blood pressure at goal levels.    Lipids Yes    Intervention Provide education and support for participant on nutrition & aerobic/resistive  exercise along with prescribed medications to achieve LDL '70mg'$ , HDL >'40mg'$ .    Expected Outcomes Short Term: Participant states understanding of desired cholesterol values and is compliant with medications prescribed. Participant is following exercise prescription and nutrition guidelines.;Long Term: Cholesterol controlled with medications as prescribed, with individualized exercise RX and with personalized nutrition plan. Value goals: LDL < '70mg'$ , HDL > 40 mg.             Education:Diabetes - Individual verbal and written instruction to review signs/symptoms of diabetes, desired ranges of glucose level fasting, after meals and with exercise. Acknowledge that pre and post exercise glucose checks will be done for 3 sessions at entry of program. [place] [facility]hab from 08/20/2022 in [facility] Cardiac and Pulmonary Rehab  Education need identified 06/25/22  Date 06/25/22  Educator [place]  Instruction Review Code 1- Verbalizes Understanding       Core Components/Risk Factors/Patient Goals Review:   Goals and  Risk Factor Review     Row Name 07/23/22 1107 08/20/22 1131           Core Components/Risk Factors/Patient Goals Review   Personal Goals Review Diabetes;Heart Failure;Hypertension;Lipids Diabetes;Heart Failure;Hypertension;Lipids      Review Saw heart failure team yesterday and they increased his spirinolactone some and did a repeat echo. He thinks his little weight gain is not fluid, but that he has been eating more lately. He doesn't have any other signs of fluid over load right now. His diabetes is being well managed. He doesn't have to check it every day, but knows the symptoms of low/high blood sugar. His blood pressure has been stable in the program so far. Antonio Clark reports doing well with maintaining his fluid balance. His medication is going well. His most recent echo showed his EF improved from 25-30% to 40-45%. He is pleased with this. He hasn't checked his sugar lately but feels like it is going well and knows what symptoms to look out for. His main goal is to increase his stamina. He states he likes to exercise outside and it has been hard with the dreary weather. He is motivated to keep coming and finish the program      Expected Outcomes Short: adhere to medication change and daily weights. Long: independenlty manage risk factors. Short: attend cardiac rehab for education and exercise. Long: independnelty manage healthy lifestyle               Core Components/Risk Factors/Patient Goals at Discharge (Final Review):   Goals and Risk Factor Review - 08/20/22 1131       Core Components/Risk Factors/Patient Goals Review   Personal Goals Review Diabetes;Heart Failure;Hypertension;Lipids    Review Antonio Clark reports doing well with maintaining his fluid balance. His medication is going well. His most recent echo showed his EF improved from 25-30% to 40-45%. He is pleased with this. He hasn't checked his sugar lately but feels like it is going well and knows what symptoms to look out for.  His main goal is to increase his stamina. He states he likes to exercise outside and it has been hard with the dreary weather. He is motivated to keep coming and finish the program    Expected Outcomes Short: attend cardiac rehab for education and exercise. Long: independnelty manage healthy lifestyle             ITP Comments:  ITP Comments     Row Name 06/17/22 1608 06/25/22 1602 06/30/22 1055 07/08/22 1102 07/16/22 1257   ITP Comments Virtual  orientation call completed today. he has an appointment on Date: 06/25/2022  for EP eval and gym Orientation.  Documentation of diagnosis can be found in [facility] Date: 05/28/2022 . Completed 6MWT and gym orientation. Initial ITP created and sent for review to Dr. Emily Filbert, Medical Director. First full day of exercise!  Patient was oriented to gym and equipment including functions, settings, policies, and procedures.  Patient's individual exercise prescription and treatment plan were reviewed.  All starting workloads were established based on the results of the 6 minute walk test done at initial orientation visit.  The plan for exercise progression was also introduced and progression will be customized based on patient's performance and goals. 30 Day review completed. Medical Director ITP review done, changes made as directed, and signed approval by Medical Director.   new to program Completed initial RD consultation    [place] Name 08/05/22 1128 09/02/22 1102         ITP Comments 30 Day review completed. Medical Director ITP review done, changes made as directed, and signed approval by Medical Director. 30 Day review completed. Medical Director ITP review done, changes made as directed, and signed approval by Medical Director.               Comments:

## 2022-09-03 ENCOUNTER — Encounter: Payer: Medicare Other | Admitting: Pharmacist

## 2022-09-03 ENCOUNTER — Encounter: Payer: Medicare Other | Admitting: *Deleted

## 2022-09-03 DIAGNOSIS — I5022 Chronic systolic (congestive) heart failure: Secondary | ICD-10-CM

## 2022-09-03 NOTE — Progress Notes (Signed)
Daily Session Note  Patient Details  Name: Antonio Clark MRN: [id number] Date of Birth: 28-Jun-1936 Referring Provider:   Flowsheet Row Cardiac Rehab from 06/25/2022 in [facility] Cardiac and Pulmonary Rehab  Referring Provider Loralie Champagne MD       Encounter Date: 09/03/2022  Check In:  Session Check In - 09/03/22 1105       Check-In   Supervising physician immediately available to respond to emergencies See telemetry face sheet for immediately available ER MD    Location [facility]-Cardiac & Pulmonary Rehab    Staff Present Darlyne Russian, RN, Lorin Mercy, MS, ACSM CEP, Exercise Physiologist;Joseph Tessie Fass, Cristopher Estimable, RN BSN    Virtual Visit No    Medication changes reported     No    Fall or balance concerns reported    No    Warm-up and Cool-down Performed on first and last piece of equipment    Resistance Training Performed Yes    VAD Patient? No    PAD/SET Patient? No      Pain Assessment   Currently in Pain? No/denies                Social History   Tobacco Use  Smoking Status Never  Smokeless Tobacco Never    Goals Met:  Independence with exercise equipment Exercise tolerated well No report of concerns or symptoms today Strength training completed today  Goals Unmet:  Not Applicable  Comments: Pt able to follow exercise prescription today without complaint.  Will continue to monitor for progression.    Dr. Emily Filbert is Medical Director for [place].  Dr. Ottie Glazier is Medical Director for [facility] Pulmonary Rehabilitation.

## 2022-09-08 ENCOUNTER — Encounter: Payer: Medicare Other | Admitting: *Deleted

## 2022-09-08 DIAGNOSIS — I5022 Chronic systolic (congestive) heart failure: Secondary | ICD-10-CM | POA: Diagnosis not present

## 2022-09-08 NOTE — Progress Notes (Signed)
Daily Session Note  Patient Details  Name: Antonio Clark MRN: [id number] Date of Birth: [birth year] Referring Provider:   Flowsheet Row Cardiac Rehab from 06/25/2022 in [facility] Cardiac and Pulmonary Rehab  Referring Provider Loralie Champagne MD       Encounter Date: 09/08/2022  Check In:  Session Check In - 09/08/22 1106       Check-In   Supervising physician immediately available to respond to emergencies See telemetry face sheet for immediately available ER MD    Location [facility]-Cardiac & Pulmonary Rehab    Staff Present Renita Papa, RN BSN;Jessica Luan Pulling, MA, RCEP, CCRP, CCET;Noah Tickle, BS, Exercise Physiologist    Virtual Visit No    Medication changes reported     No    Fall or balance concerns reported    No    Warm-up and Cool-down Performed on first and last piece of equipment    Resistance Training Performed Yes    VAD Patient? No    PAD/SET Patient? No      Pain Assessment   Currently in Pain? No/denies                Social History   Tobacco Use  Smoking Status Never  Smokeless Tobacco Never    Goals Met:  Independence with exercise equipment Exercise tolerated well No report of concerns or symptoms today Strength training completed today  Goals Unmet:  Not Applicable  Comments: Pt able to follow exercise prescription today without complaint.  Will continue to monitor for progression.    Dr. Emily Filbert is Medical Director for [place].  Dr. Ottie Glazier is Medical Director for [facility] Pulmonary Rehabilitation.

## 2022-09-10 ENCOUNTER — Encounter: Payer: Medicare Other | Attending: Cardiology | Admitting: *Deleted

## 2022-09-10 DIAGNOSIS — I5022 Chronic systolic (congestive) heart failure: Secondary | ICD-10-CM

## 2022-09-10 NOTE — Progress Notes (Signed)
Daily Session Note  Patient Details  Name: Antonio Clark MRN: [id number] Date of Birth: 25-Jan-1936 Referring Provider:   Flowsheet Row Cardiac Rehab from 06/25/2022 in [facility] Cardiac and Pulmonary Rehab  Referring Provider Loralie Champagne MD       Encounter Date: 09/10/2022  Check In:  Session Check In - 09/10/22 1110       Check-In   Supervising physician immediately available to respond to emergencies See telemetry face sheet for immediately available ER MD    Location [facility]-Cardiac & Pulmonary Rehab    Staff Present Darlyne Russian, RN, ADN;Meredith Sherryll Burger, RN Abel Presto, MS, ACSM CEP, Exercise Physiologist;Joseph Tessie Fass, [place]    Virtual Visit No    Medication changes reported     No    Fall or balance concerns reported    No    Warm-up and Cool-down Performed on first and last piece of equipment    Resistance Training Performed Yes    VAD Patient? No    PAD/SET Patient? No      Pain Assessment   Currently in Pain? No/denies                Social History   Tobacco Use  Smoking Status Never  Smokeless Tobacco Never    Goals Met:  Independence with exercise equipment Exercise tolerated well No report of concerns or symptoms today Strength training completed today  Goals Unmet:  Not Applicable  Comments: Pt able to follow exercise prescription today without complaint.  Will continue to monitor for progression.    Dr. Emily Filbert is Medical Director for [place].  Dr. Ottie Glazier is Medical Director for [facility] Pulmonary Rehabilitation.

## 2022-09-15 ENCOUNTER — Encounter: Payer: Medicare Other | Admitting: *Deleted

## 2022-09-15 DIAGNOSIS — I5022 Chronic systolic (congestive) heart failure: Secondary | ICD-10-CM

## 2022-09-15 NOTE — Progress Notes (Signed)
Daily Session Note  Patient Details  Name: Antonio Clark MRN: [id number] Date of Birth: 29-Oct-1935 Referring Provider:   Flowsheet Row Cardiac Rehab from 06/25/2022 in [facility] Cardiac and Pulmonary Rehab  Referring Provider Loralie Champagne MD       Encounter Date: 09/15/2022  Check In:  Session Check In - 09/15/22 1056       Check-In   Supervising physician immediately available to respond to emergencies See telemetry face sheet for immediately available ER MD    Location [facility]-Cardiac & Pulmonary Rehab    Staff Present Renita Papa, RN BSN;Joseph Elmer, RCP,RRT,BSRT;Kara Maricela Bo, MS, ACSM CEP, Exercise Physiologist    Virtual Visit No    Medication changes reported     No    Fall or balance concerns reported    No    Warm-up and Cool-down Performed on first and last piece of equipment    Resistance Training Performed Yes    VAD Patient? No    PAD/SET Patient? No      Pain Assessment   Currently in Pain? No/denies                Social History   Tobacco Use  Smoking Status Never  Smokeless Tobacco Never    Goals Met:  Independence with exercise equipment Exercise tolerated well No report of concerns or symptoms today Strength training completed today  Goals Unmet:  Not Applicable  Comments: Pt able to follow exercise prescription today without complaint.  Will continue to monitor for progression.    Dr. Emily Filbert is Medical Director for [place].  Dr. Ottie Glazier is Medical Director for [facility] Pulmonary Rehabilitation.

## 2022-09-17 ENCOUNTER — Encounter: Payer: Medicare Other | Admitting: *Deleted

## 2022-09-17 ENCOUNTER — Encounter (HOSPITAL_COMMUNITY): Payer: Self-pay | Admitting: *Deleted

## 2022-09-17 DIAGNOSIS — I5022 Chronic systolic (congestive) heart failure: Secondary | ICD-10-CM

## 2022-09-17 NOTE — Progress Notes (Signed)
Daily Session Note  Patient Details  Name: Antonio Clark MRN: [id number] Date of Birth: Apr 24, 1936 Referring Provider:   Flowsheet Row Cardiac Rehab from 06/25/2022 in [facility] Cardiac and Pulmonary Rehab  Referring Provider Loralie Champagne MD       Encounter Date: 09/17/2022  Check In:  Session Check In - 09/17/22 1036       Check-In   Supervising physician immediately available to respond to emergencies See telemetry face sheet for immediately available ER MD    Location [facility]-Cardiac & Pulmonary Rehab    Staff Present Renita Papa, RN Abel Presto, MS, ACSM CEP, Exercise Physiologist;Noah Tickle, BS, Exercise Physiologist    Virtual Visit No    Medication changes reported     No    Fall or balance concerns reported    No    Warm-up and Cool-down Performed on first and last piece of equipment    Resistance Training Performed Yes    VAD Patient? No    PAD/SET Patient? No      Pain Assessment   Currently in Pain? No/denies                Social History   Tobacco Use  Smoking Status Never  Smokeless Tobacco Never    Goals Met:  Independence with exercise equipment Exercise tolerated well No report of concerns or symptoms today Strength training completed today  Goals Unmet:  Not Applicable  Comments: Pt able to follow exercise prescription today without complaint.  Will continue to monitor for progression.    Dr. Emily Filbert is Medical Director for [place].  Dr. Ottie Glazier is Medical Director for [facility] Pulmonary Rehabilitation.

## 2022-09-22 ENCOUNTER — Encounter: Payer: Medicare Other | Admitting: *Deleted

## 2022-09-22 DIAGNOSIS — I5022 Chronic systolic (congestive) heart failure: Secondary | ICD-10-CM

## 2022-09-22 NOTE — Progress Notes (Signed)
Daily Session Note  Patient Details  Name: Antonio Clark MRN: [id number] Date of Birth: 06-28-1936 Referring Provider:   Flowsheet Row Cardiac Rehab from 06/25/2022 in [facility] Cardiac and Pulmonary Rehab  Referring Provider Loralie Champagne MD       Encounter Date: 09/22/2022  Check In:  Session Check In - 09/22/22 1119       Check-In   Supervising physician immediately available to respond to emergencies See telemetry face sheet for immediately available ER MD    Location [facility]-Cardiac & Pulmonary Rehab    Staff Present Renita Papa, RN BSN;Noah Tickle, BS, Exercise Physiologist;Jessica Bagnell, MA, RCEP, CCRP, CCET    Virtual Visit No    Medication changes reported     No    Fall or balance concerns reported    No    Warm-up and Cool-down Performed on first and last piece of equipment    Resistance Training Performed Yes    VAD Patient? No    PAD/SET Patient? No      Pain Assessment   Currently in Pain? No/denies                Social History   Tobacco Use  Smoking Status Never  Smokeless Tobacco Never    Goals Met:  Independence with exercise equipment Exercise tolerated well No report of concerns or symptoms today Strength training completed today  Goals Unmet:  Not Applicable  Comments: Pt able to follow exercise prescription today without complaint.  Will continue to monitor for progression.    Dr. Emily Filbert is Medical Director for [name].  Dr. Ottie Glazier is Medical Director for [facility] Pulmonary Rehabilitation.

## 2022-09-24 ENCOUNTER — Encounter: Payer: Medicare Other | Admitting: *Deleted

## 2022-09-24 VITALS — Ht 68.5 in | Wt 176.7 lb

## 2022-09-24 DIAGNOSIS — I5022 Chronic systolic (congestive) heart failure: Secondary | ICD-10-CM

## 2022-09-24 NOTE — Progress Notes (Signed)
Daily Session Note  Patient Details  Name: Antonio Clark MRN: [id number] Date of Birth: 1936-03-09 Referring Provider:   Flowsheet Row Cardiac Rehab from 06/25/2022 in [facility] Cardiac and Pulmonary Rehab  Referring Provider Loralie Champagne MD       Encounter Date: 09/24/2022  Check In:  Session Check In - 09/24/22 1122       Check-In   Supervising physician immediately available to respond to emergencies See telemetry face sheet for immediately available ER MD    Location [facility]-Cardiac & Pulmonary Rehab    Staff Present Darlyne Russian, RN, ADN;Jessica Luan Pulling, MA, RCEP, CCRP, CCET;Joseph [place], RCP,RRT,BSRT;Noah [place], [place], Exercise Physiologist    Virtual Visit No    Medication changes reported     No    Fall or balance concerns reported    No    Warm-up and Cool-down Performed on first and last piece of equipment    Resistance Training Performed Yes    VAD Patient? No    PAD/SET Patient? No      Pain Assessment   Currently in Pain? No/denies                Social History   Tobacco Use  Smoking Status Never  Smokeless Tobacco Never    Goals Met:  Independence with exercise equipment Exercise tolerated well No report of concerns or symptoms today Strength training completed today  Goals Unmet:  Not Applicable  Comments: Pt able to follow exercise prescription today without complaint.  Will continue to monitor for progression.   [place] Name 06/25/22 1613 09/24/22 1118       6 Minute Walk   Phase Initial Discharge    Distance 985 feet 1300 feet    Distance % Change -- 32 %    Distance Feet Change -- 315 ft    Walk Time 6 minutes 6 minutes    # of Rest Breaks 0 0    MPH 1.86 2.46    METS 1.67 2.29    RPE 12 12    Perceived Dyspnea  0 --    VO2 Peak 5.85 8.02    Symptoms No Yes (comment)    Comments -- hip pain 2/10    Resting HR 84 bpm 62 bpm    Resting BP 120/66 124/74    Resting Oxygen Saturation  99 % 95 %    Exercise Oxygen  Saturation  during 6 min walk 97 % 92 %    Max Ex. HR 94 bpm 95 bpm    Max Ex. BP 130/62 148/74    2 Minute Post BP 122/68 --               Dr. Emily Filbert is Medical Director for [place].  Dr. Ottie Glazier is Medical Director for [facility] Pulmonary Rehabilitation.

## 2022-09-29 ENCOUNTER — Encounter: Payer: Medicare Other | Admitting: *Deleted

## 2022-09-29 DIAGNOSIS — I5022 Chronic systolic (congestive) heart failure: Secondary | ICD-10-CM | POA: Diagnosis not present

## 2022-09-29 NOTE — Progress Notes (Signed)
Daily Session Note  Patient Details  Name: Antonio Clark MRN: [id number] Date of Birth: 1935-10-31 Referring Provider:   Flowsheet Row Cardiac Rehab from 06/25/2022 in [facility] Cardiac and Pulmonary Rehab  Referring Provider Loralie Champagne MD       Encounter Date: 09/29/2022  Check In:  Session Check In - 09/29/22 1145       Check-In   Supervising physician immediately available to respond to emergencies See telemetry face sheet for immediately available ER MD    Location [facility]-Cardiac & Pulmonary Rehab    Staff Present Antionette Fairy, BS, Exercise Physiologist;Mary Kellie Shropshire, RN, BSN, [place] Pa, MA, RCEP, CCRP, CCET    Virtual Visit No    Medication changes reported     No    Fall or balance concerns reported    No    Tobacco Cessation No Change    Warm-up and Cool-down Performed on first and last piece of equipment    Resistance Training Performed Yes    VAD Patient? No    PAD/SET Patient? No      Pain Assessment   Currently in Pain? No/denies    Multiple Pain Sites No                Social History   Tobacco Use  Smoking Status Never  Smokeless Tobacco Never    Goals Met:  Independence with exercise equipment Exercise tolerated well Queuing for purse lip breathing  Goals Unmet:  Not Applicable  Comments: Pt able to follow exercise prescription today without complaint.  Will continue to monitor for progression.    Dr. Emily Filbert is Medical Director for [place].  Dr. Ottie Glazier is Medical Director for [facility] Pulmonary Rehabilitation.

## 2022-09-30 ENCOUNTER — Encounter: Payer: Self-pay | Admitting: *Deleted

## 2022-09-30 DIAGNOSIS — I5022 Chronic systolic (congestive) heart failure: Secondary | ICD-10-CM

## 2022-09-30 NOTE — Progress Notes (Signed)
Cardiac Individual Treatment Plan  Patient Details  Name: SADLER MALINAK MRN: [id number] Date of Birth: 1936-04-03 Referring Provider:   Flowsheet Row Cardiac Rehab from 06/25/2022 in [facility] Cardiac and Pulmonary Rehab  Referring Provider Loralie Champagne MD       Initial Encounter Date:  Flowsheet Row Cardiac Rehab from 06/25/2022 in [facility] Cardiac and Pulmonary Rehab  Date 06/25/22       Visit Diagnosis: Systolic heart failure, chronic ([place])  Patient's Home Medications on Admission:  Current Outpatient Medications:    acetaminophen (TYLENOL) 325 MG tablet, Take 2 tablets (650 mg total) by mouth every 6 (six) hours as needed for mild pain (or Fever >/= 101)., Disp: , Rfl:    amiodarone (PACERONE) 200 MG tablet, Take 0.5 tablets (100 mg total) by mouth daily., Disp: 45 tablet, Rfl: 3   atorvastatin (LIPITOR) 80 MG tablet, Take 1 tablet (80 mg total) by mouth daily., Disp: 90 tablet, Rfl: 3   ELIQUIS 2.5 MG TABS tablet, TAKE 1 TABLET BY MOUTH TWICE A DAY, Disp: 180 tablet, Rfl: 1   empagliflozin (JARDIANCE) 10 MG TABS tablet, Take 1 tablet (10 mg total) by mouth daily before breakfast., Disp: 90 tablet, Rfl: 3   finasteride (PROSCAR) 5 MG tablet, Take 5 mg by mouth daily., Disp: , Rfl:    fluticasone (FLONASE) 50 MCG/ACT nasal spray, Place 1 spray into both nostrils daily as needed for allergies or rhinitis., Disp: , Rfl:    furosemide (LASIX) 40 MG tablet, Take 0.5 tablets (20 mg total) by mouth daily., Disp: 90 tablet, Rfl: 3   linagliptin (TRADJENTA) 5 MG TABS tablet, TAKE 1 TABLET (5 MG TOTAL) BY MOUTH DAILY., Disp: 90 tablet, Rfl: 0   LORazepam (ATIVAN) 0.5 MG tablet, TAKE 1 TABLET BY MOUTH TWICE A DAY AS NEEDED FOR ANXIETY, Disp: 30 tablet, Rfl: 1   metoprolol succinate (TOPROL-XL) 50 MG 24 hr tablet, Take 0.5 tablets (25 mg total) by mouth at bedtime. Take with or immediately following a meal., Disp: 30 tablet, Rfl: 5   nitroGLYCERIN (NITROSTAT) 0.4 MG SL tablet, Place 1 tablet (0.4  mg total) under the tongue every 5 (five) minutes x 3 doses as needed for chest pain., Disp: 25 tablet, Rfl: 12   ondansetron (ZOFRAN) 4 MG tablet, Take 1 tablet (4 mg total) by mouth every 6 (six) hours as needed for nausea., Disp: 20 tablet, Rfl: 0   oxymetazoline (AFRIN) 0.05 % nasal spray, Place 1 spray into both nostrils as needed for congestion., Disp: , Rfl:    spironolactone (ALDACTONE) 25 MG tablet, Take 1 tablet (25 mg total) by mouth at bedtime., Disp: 45 tablet, Rfl: 3   traZODone (DESYREL) 50 MG tablet, TAKE 0.5 TABLETS BY MOUTH AT BEDTIME AS NEEDED FOR SLEEP., Disp: 45 tablet, Rfl: 3  Past Medical History: Past Medical History:  Diagnosis Date   Anal fissure    Arthritis    Atrial fibrillation (HCC)    Back pain    CHF (congestive heart failure) (HCC)    Colon polyps    Coronary artery disease    Diabetes mellitus type 2 with complications ([place])    Dysrhythmia    a-fib   GERD (gastroesophageal reflux disease)    History of echocardiogram 07/ 07/ 2011   History of lithotripsy 1989   Hyperlipidemia    Hypertension    Kidney stones    Melanoma ([place]) 05/01/2019   right chest wall (12/20)   Prostate CA ([place]) 2010   Sleep apnea  uses CPAP nightly   Squamous cell carcinoma of skin 05/23/1992   bowens-left parietal scalp ([id number])   Squamous cell carcinoma of skin 03/14/2008   in situ-left upper outer forehead-medial ([id number])   Squamous cell carcinoma of skin 03/14/2008   in situ-crown of scalp ([id number])   Squamous cell carcinoma of skin 04/15/2011   in situ-right dorsal forearm (txpbx)   Squamous cell carcinoma of skin 10/16/2011   in situ-left sideburn   Squamous cell carcinoma of skin 03/11/2015   ka-left sideburn ([id number])   Squamous cell carcinoma of skin 03/11/2015   ka-left forearm ([id number])   Squamous cell carcinoma of skin 06/22/2018   in situ-left forearm, sup (txpbx)   Squamous cell carcinoma of skin 05/01/2019   in situ-mid anterior scalp    Squamous  cell carcinoma of skin 05/01/2019   in situ-right upper arm    Tobacco Use: Social History   Tobacco Use  Smoking Status Never  Smokeless Tobacco Never    Labs: Review Flowsheet  More data exists      Latest Ref Rng & Units 12/17/2020 05/05/2021 10/30/2021 02/09/2022 05/28/2022  Labs for ITP Cardiac and Pulmonary Rehab  Cholestrol 0 - 200 mg/dL 112  108  103  - 142   LDL (calc) 0 - 99 mg/dL 51  45  38  - 54   HDL-C >40 mg/dL 37  43  48  - 55   Trlycerides <150 mg/dL 158  116  83  - 167   Hemoglobin A1c 4.8 - 5.6 % 6.7  6.6  6.3  6.4  -     Exercise Target Goals: Exercise Program Goal: Individual exercise prescription set using results from initial 6 min walk test and THRR while considering  patient's activity barriers and safety.   Exercise Prescription Goal: Initial exercise prescription builds to 30-45 minutes a day of aerobic activity, 2-3 days per week.  Home exercise guidelines will be given to patient during program as part of exercise prescription that the participant will acknowledge.   Education: Aerobic Exercise: - Group verbal and visual presentation on the components of exercise prescription. Introduces F.I.T.T principle from ACSM for exercise prescriptions.  Reviews F.I.T.T. principles of aerobic exercise including progression. Written material given at graduation.   Education: Resistance Exercise: - Group verbal and visual presentation on the components of exercise prescription. Introduces F.I.T.T principle from ACSM for exercise prescriptions  Reviews F.I.T.T. principles of resistance exercise including progression. Written material given at graduation. Flowsheet Row Cardiac Rehab from 09/24/2022 in [facility] Cardiac and Pulmonary Rehab  Date 09/03/22  Educator [facility]  Instruction Review Code 1- Verbalizes Understanding        Education: Exercise & Equipment Safety: - Individual verbal instruction and demonstration of equipment use and safety with use of the  equipment. Flowsheet Row Cardiac Rehab from 09/24/2022 in [facility] Cardiac and Pulmonary Rehab  Education need identified 06/25/22  Date 06/25/22  Educator [place]  Instruction Review Code 1- Verbalizes Understanding       Education: Exercise Physiology & General Exercise Guidelines: - Group verbal and written instruction with models to review the exercise physiology of the cardiovascular system and associated critical values. Provides general exercise guidelines with specific guidelines to those with heart or lung disease.  Flowsheet Row Cardiac Rehab from 09/24/2022 in [facility] Cardiac and Pulmonary Rehab  Education need identified 06/25/22  Date 08/20/22  Educator [facility]  Instruction Review Code 1- [employer] Understanding       Education: Flexibility, Balance, Mind/Body Relaxation: - Group  verbal and visual presentation with interactive activity on the components of exercise prescription. Introduces F.I.T.T principle from ACSM for exercise prescriptions. Reviews F.I.T.T. principles of flexibility and balance exercise training including progression. Also discusses the mind body connection.  Reviews various relaxation techniques to help reduce and manage stress (i.e. Deep breathing, progressive muscle relaxation, and visualization). Balance handout provided to take home. Written material given at graduation. Flowsheet Row Cardiac Rehab from 09/24/2022 in [facility] Cardiac and Pulmonary Rehab  Date 09/10/22  Educator [facility]  Instruction Review Code 1- Verbalizes Understanding       Activity Barriers & Risk Stratification:  Activity Barriers & Cardiac Risk Stratification - 06/25/22 1612       Activity Barriers & Cardiac Risk Stratification   Activity Barriers Balance Concerns;Back Problems;Arthritis;Assistive Device;Deconditioning   walker/cane as needed but has not used in a while   Cardiac Risk Stratification High             6 Minute Walk:  6 Minute Walk     Row Name 06/25/22 1613 09/24/22 1118        6 Minute Walk   Phase Initial Discharge    Distance 985 feet 1300 feet    Distance % Change -- 32 %    Distance Feet Change -- 315 ft    Walk Time 6 minutes 6 minutes    # of Rest Breaks 0 0    MPH 1.86 2.46    METS 1.67 2.29    RPE 12 12    Perceived Dyspnea  0 --    VO2 Peak 5.85 8.02    Symptoms No Yes (comment)    Comments -- hip pain 2/10    Resting HR 84 bpm 62 bpm    Resting BP 120/66 124/74    Resting Oxygen Saturation  99 % 95 %    Exercise Oxygen Saturation  during 6 min walk 97 % 92 %    Max Ex. HR 94 bpm 95 bpm    Max Ex. BP 130/62 148/74    2 Minute Post BP 122/68 --             Oxygen Initial Assessment:   Oxygen Re-Evaluation:   Oxygen Discharge (Final Oxygen Re-Evaluation):   Initial Exercise Prescription:  Initial Exercise Prescription - 06/25/22 1600       Date of Initial Exercise RX and Referring Provider   Date 06/25/22    Referring Provider Loralie Champagne MD      Recumbant Bike   Level 1    RPM 60    Watts 18    Minutes 15    METs 1.6      NuStep   Level 1    SPM 80    Minutes 15    METs 1.6      T5 Nustep   Level 1    SPM 80    Minutes 15    METs 1.6      Track   Laps 20    Minutes 15    METs 2.09      Prescription Details   Frequency (times per week) 2    Duration Progress to 30 minutes of continuous aerobic without signs/symptoms of physical distress      Intensity   THRR 40-80% of Max Heartrate 104 - 125    Ratings of Perceived Exertion 11-13    Perceived Dyspnea 0-4      Progression   Progression Continue to progress workloads to maintain intensity without  signs/symptoms of physical distress.      Resistance Training   Training Prescription Yes    Weight 3 lb    Reps 10-15             Perform Capillary Blood Glucose checks as needed.  Exercise Prescription Changes:   Exercise Prescription Changes     Row Name 06/25/22 1600 07/16/22 1500 07/28/22 1100 07/28/22 1400 08/13/22 0900      Response to Exercise   Blood Pressure (Admit) 120/66 120/62 -- 116/62 112/60   Blood Pressure (Exercise) 130/62 142/64 -- 128/62 142/72   Blood Pressure (Exit) 122/68 126/64 -- 110/64 124/64   Heart Rate (Admit) 84 bpm 68 bpm -- 69 bpm 74 bpm   Heart Rate (Exercise) 94 bpm 119 bpm -- 117 bpm 113 bpm   Heart Rate (Exit) 79 bpm 64 bpm -- 78 bpm 91 bpm   Oxygen Saturation (Admit) 99 % -- -- -- --   Oxygen Saturation (Exercise) 97 % -- -- -- --   Oxygen Saturation (Exit) 99 % -- -- -- --   Rating of Perceived Exertion (Exercise) 12 14 -- 15 15   Perceived Dyspnea (Exercise) 0 -- -- -- --   Symptoms none none -- none none   Comments walk test results First three rehab sessions -- -- --   Duration -- Continue with 30 min of aerobic exercise without signs/symptoms of physical distress. -- Continue with 30 min of aerobic exercise without signs/symptoms of physical distress. Continue with 30 min of aerobic exercise without signs/symptoms of physical distress.   Intensity -- THRR unchanged -- THRR unchanged THRR unchanged     Progression   Progression -- Continue to progress workloads to maintain intensity without signs/symptoms of physical distress. -- Continue to progress workloads to maintain intensity without signs/symptoms of physical distress. Continue to progress workloads to maintain intensity without signs/symptoms of physical distress.   Average METs -- 2.55 -- 2.63 2.71     Resistance Training   Training Prescription -- Yes -- Yes Yes   Weight -- 3 lb -- 3 lb 3 lb   Reps -- 10-15 -- 10-15 10-15     Interval Training   Interval Training -- No -- No No     Treadmill   MPH -- -- -- 2.5 2.5   Grade -- -- -- 1 1   Minutes -- -- -- 15 15   METs -- -- -- 3.26 3.26     Recumbant Bike   Level -- 1 -- 1.5 7   Watts -- 16 -- -- 25   Minutes -- 15 -- 15 15   METs -- 3.02 -- 1.7 --     NuStep   Level -- 4 -- 5 5   Minutes -- 15 -- 15 15   METs -- 2.8 -- 3.2 3.6     Recumbant  Elliptical   Level -- -- -- -- 1.5   Minutes -- -- -- -- 15   METs -- -- -- -- 1.6     T5 Nustep   Level -- 1 -- 3 4   Minutes -- 15 -- 15 15   METs -- 1.9 -- 2 2.1     Track   Laps -- 30 -- 30 --   Minutes -- 15 -- 15 --   METs -- 2.63 -- 2.63 --     Home Exercise Plan   Plans to continue exercise at -- -- [employer] (comment)  [name] plans to  walk at home, and go to the [facility] to use their aerobic machines. [facility] ([facility]  [place] plans to walk at home, and go to [employer] to use their aerobic machines. [facility] (comment)  [place] plans to walk at home, and go to [employer] to use their aerobic machines.   Frequency -- -- Add 3 additional days to program exercise sessions. Add 3 additional days to program exercise sessions. Add 3 additional days to program exercise sessions.   Initial Home Exercises Provided -- -- 07/28/22 07/28/22 07/28/22     Oxygen   Maintain Oxygen Saturation -- 88% or higher 88% or higher 88% or higher 88% or higher    Row Name 08/25/22 1500 09/10/22 1500 09/23/22 1700         Response to Exercise   Blood Pressure (Admit) 124/72 106/62 118/60     Blood Pressure (Exit) 120/70 120/66 98/58     Heart Rate (Admit) 71 bpm 72 bpm 65 bpm     Heart Rate (Exercise) 96 bpm 92 bpm 108 bpm     Heart Rate (Exit) 79 bpm 88 bpm 70 bpm     Rating of Perceived Exertion (Exercise) 14 13 13     $ Symptoms none none none     Duration Continue with 30 min of aerobic exercise without signs/symptoms of physical distress. Continue with 30 min of aerobic exercise without signs/symptoms of physical distress. Continue with 30 min of aerobic exercise without signs/symptoms of physical distress.     Intensity THRR unchanged THRR unchanged THRR unchanged       Progression   Progression Continue to progress workloads to maintain intensity without signs/symptoms of physical distress. Continue to progress workloads to maintain intensity without  signs/symptoms of physical distress. Continue to progress workloads to maintain intensity without signs/symptoms of physical distress.     Average METs 2.64 2.87 2.45       Resistance Training   Training Prescription Yes Yes Yes     Weight 3 lb 3 lb 3 lb     Reps 10-15 10-15 10-15       Interval Training   Interval Training No No No       Treadmill   MPH 2.1 2.2 2.5     Grade 0.5 2 2     $ Minutes 15 15 15     $ METs 2.75 3.29 3.6       Recumbant Bike   Level 7 -- --     Watts 25 -- --     Minutes 15 -- --     METs 3 -- --       NuStep   Level 5 5 5     $ Minutes 15 15 15     $ METs 3.6 3.5 2.8       Recumbant Elliptical   Level -- 1.3 2.7     Minutes -- 15 15     METs -- 1.6 2       T5 Nustep   Level 5 -- --     Minutes 15 -- --     METs 2.1 -- --       Home Exercise Plan   Plans to continue exercise at [employer] (comment)  [name] plans to walk at home, and go to the [facility] to use their aerobic machines. [facility] (comment)  [place] plans to walk at home, and go to [employer] to use their aerobic machines. [facility] (comment)  [name] plans to walk  at home, and go to the [facility] to use their aerobic machines.     Frequency Add 3 additional days to program exercise sessions. Add 3 additional days to program exercise sessions. Add 3 additional days to program exercise sessions.     Initial Home Exercises Provided 07/28/22 07/28/22 07/28/22       Oxygen   Maintain Oxygen Saturation 88% or higher 88% or higher 88% or higher              Exercise Comments:   Exercise Comments     Row Name 06/30/22 1056           Exercise Comments First full day of exercise!  Patient was oriented to gym and equipment including functions, settings, policies, and procedures.  Patient's individual exercise prescription and treatment plan were reviewed.  All starting workloads were established based on the results of the 6 minute walk test done at initial  orientation visit.  The plan for exercise progression was also introduced and progression will be customized based on patient's performance and goals.                Exercise Goals and Review:   Exercise Goals     Row Name 06/25/22 1621             Exercise Goals   Increase Physical Activity Yes       Intervention Provide advice, education, support and counseling about physical activity/exercise needs.;Develop an individualized exercise prescription for aerobic and resistive training based on initial evaluation findings, risk stratification, comorbidities and participant's personal goals.       Expected Outcomes Short Term: Attend rehab on a regular basis to increase amount of physical activity.;Long Term: Add in home exercise to make exercise part of routine and to increase amount of physical activity.;Long Term: Exercising regularly at least 3-5 days a week.       Increase Strength and Stamina Yes       Intervention Provide advice, education, support and counseling about physical activity/exercise needs.;Develop an individualized exercise prescription for aerobic and resistive training based on initial evaluation findings, risk stratification, comorbidities and participant's personal goals.       Expected Outcomes Short Term: Increase workloads from initial exercise prescription for resistance, speed, and METs.;Short Term: Perform resistance training exercises routinely during rehab and add in resistance training at home;Long Term: Improve cardiorespiratory fitness, muscular endurance and strength as measured by increased METs and functional capacity (6MWT)       Able to understand and use rate of perceived exertion (RPE) scale Yes       Intervention Provide education and explanation on how to use RPE scale       Expected Outcomes Short Term: Able to use RPE daily in rehab to express subjective intensity level;Long Term:  Able to use RPE to guide intensity level when exercising  independently       Able to understand and use Dyspnea scale Yes       Intervention Provide education and explanation on how to use Dyspnea scale       Expected Outcomes Short Term: Able to use Dyspnea scale daily in rehab to express subjective sense of shortness of breath during exertion;Long Term: Able to use Dyspnea scale to guide intensity level when exercising independently       Knowledge and understanding of Target Heart Rate Range (THRR) Yes       Intervention Provide education and explanation of THRR including how the numbers were  predicted and where they are located for reference       Expected Outcomes Short Term: Able to state/look up THRR;Long Term: Able to use THRR to govern intensity when exercising independently;Short Term: Able to use daily as guideline for intensity in rehab       Able to check pulse independently Yes       Intervention Provide education and demonstration on how to check pulse in carotid and radial arteries.;Review the importance of being able to check your own pulse for safety during independent exercise       Expected Outcomes Long Term: Able to check pulse independently and accurately;Short Term: Able to explain why pulse checking is important during independent exercise       Understanding of Exercise Prescription Yes       Intervention Provide education, explanation, and written materials on patient's individual exercise prescription       Expected Outcomes Short Term: Able to explain program exercise prescription;Long Term: Able to explain home exercise prescription to exercise independently                Exercise Goals Re-Evaluation :  Exercise Goals Re-Evaluation     Row Name 06/30/22 1057 07/16/22 1518 07/28/22 1131 07/28/22 1500 08/13/22 1000     Exercise Goal Re-Evaluation   Exercise Goals Review Increase Physical Activity;Able to understand and use rate of perceived exertion (RPE) scale;Knowledge and understanding of Target Heart Rate Range  (THRR);Understanding of Exercise Prescription;Increase Strength and Stamina;Able to check pulse independently Increase Physical Activity;Understanding of Exercise Prescription;Increase Strength and Stamina Increase Physical Activity;Understanding of Exercise Prescription;Increase Strength and Stamina;Able to check pulse independently;Knowledge and understanding of Target Heart Rate Range (THRR);Able to understand and use Dyspnea scale;Able to understand and use rate of perceived exertion (RPE) scale Increase Physical Activity;Increase Strength and Stamina;Understanding of Exercise Prescription Increase Physical Activity;Increase Strength and Stamina;Understanding of Exercise Prescription   Comments Reviewed RPE scale, THR and program prescription with pt today.  Pt voiced understanding and was given a copy of goals to take home. [name] is off to a good start in rehab. He had an overall average MET level of 2.55 METs during his first three sessions. He also was able to walk up to 30 laps on the track and worked up to level 4 on the T4. He did well with 3 lb hand weights for resistance training as well. We will continue to monitor his progress in the program. Reviewed home exercise with pt today.  Pt plans to walk at home, as well as go to the [facility] and use their aerobic machines for exercise.  Reviewed THR, pulse, RPE, sign and symptoms, pulse oximetery and when to call 911 or MD.  Also discussed weather considerations and indoor options.  Pt voiced understanding. [name] continues to do well in rehab. He did increase to level 5 on the T4 nustep and also was able to use the treadmill for the first time which he did well, working over 3.2 METS! He is staying steady at 30 laps on the track and would beenfit from increasing beyond that. We will continue to monitor. [name] is doing well in rehab. He increased his overall average MET level to 2.71 METs. He also tried increasing his speed on the treadmill to 2.6 mph  with no incline, but he rated it as hard on the RPE scale. He was able to improve to level 7 on the recumbent bike and level 4 on the T5 as well. We will continue  to monitor his progress in the program.   Expected Outcomes Short: Use RPE daily to regulate intensity.  Long: Follow program prescription in THR. Short: Continue to follow current exercise prescription. Long: Continue to increase strength and stamina. Short: Begin to walk at home on days away from rehab. Long: Continue to exercise independently. Short: Increase number of laps on track over 30 Long: Continue to increase overall MET level Short: Continue to increase workload on treadmill. Long: Continue to increase strength and stamina.    [place] Name 08/25/22 1518 09/10/22 1134 09/10/22 1509 09/23/22 1754       Exercise Goal Re-Evaluation   Exercise Goals Review Increase Physical Activity;Increase Strength and Stamina;Understanding of Exercise Prescription Increase Physical Activity;Increase Strength and Stamina;Understanding of Exercise Prescription Increase Physical Activity;Increase Strength and Stamina;Understanding of Exercise Prescription Increase Physical Activity;Increase Strength and Stamina;Understanding of Exercise Prescription    Comments [name] continues to do well in rehab.  His workload on the treadmill is a little inconsistent ans staff will remind patient to keep it consistent, however, he was able to work at a 2.1 mph with a 0.5% incline. He did increase to level 5 on the T5 Nustep. RPEs are staying appropriate. Patient would also benefit from increasing to 4 lb for handweights. Will continue to monitor. [name] is doing well in rehab.  He is walking at home some to help with hip strength.  He is also building back his time at the [facility] as well.  He is feeling like his stamina is recovering. [name] continues to do well in rehab. He recently increased his overall average MET level to 2.87 METs. He also increased his workload on  the treadmill to a speed of 2.2 mph and an incline of 2%. He has also continued to do well with 3 lb hand weights for resistance training, but may benefit from trying 4 lb. We will continue to monitor his progress in the program. [name] continues to do well in rehab. He has increased to a speed of 2.5 mph on the treadmill and also increased to level 2.7 on the REL. He is due for his post 6MWT and we hope to see significant improvement. We will continue to monitor.    Expected Outcomes Short: Increase to 4 lb handweights Long: Continue to increase overall MET level Short: Conitnue to walk and go to [facility] on off days Long: conitnue to improve stamina Short: Try using 4 lb hand weights. Long: Continue to improve strength and stamina. Short: Improve on post 6MWT Long: Continue to increase overall MET level and strength             Discharge Exercise Prescription (Final Exercise Prescription Changes):  Exercise Prescription Changes - 09/23/22 1700       Response to Exercise   Blood Pressure (Admit) 118/60    Blood Pressure (Exit) 98/58    Heart Rate (Admit) 65 bpm    Heart Rate (Exercise) 108 bpm    Heart Rate (Exit) 70 bpm    Rating of Perceived Exertion (Exercise) 13    Symptoms none    Duration Continue with 30 min of aerobic exercise without signs/symptoms of physical distress.    Intensity THRR unchanged      Progression   Progression Continue to progress workloads to maintain intensity without signs/symptoms of physical distress.    Average METs 2.45      Resistance Training   Training Prescription Yes    Weight 3 lb    Reps 10-15  Interval Training   Interval Training No      Treadmill   MPH 2.5    Grade 2    Minutes 15    METs 3.6      NuStep   Level 5    Minutes 15    METs 2.8      Recumbant Elliptical   Level 2.7    Minutes 15    METs 2      Home Exercise Plan   Plans to continue exercise at [employer] (comment)   [name] plans to walk at home,  and go to the [facility] to use their aerobic machines.   Frequency Add 3 additional days to program exercise sessions.    Initial Home Exercises Provided 07/28/22      Oxygen   Maintain Oxygen Saturation 88% or higher             Nutrition:  Target Goals: Understanding of nutrition guidelines, daily intake of sodium <1568m, cholesterol <2066m calories 30% from fat and 7% or less from saturated fats, daily to have 5 or more servings of fruits and vegetables.  Education: All About Nutrition: -Group instruction provided by verbal, written material, interactive activities, discussions, models, and posters to present general guidelines for heart healthy nutrition including fat, fiber, MyPlate, the role of sodium in heart healthy nutrition, utilization of the nutrition label, and utilization of this knowledge for meal planning. Follow up email sent as well. Written material given at graduation. [facility]  Date 07/09/22  [Nutrition 2]  [facility] Review Code 1- Verbalizes Understanding       Biometrics:  Pre Biometrics - 06/25/22 1612       Pre Biometrics   Height 5' 8.5" (1.74 m)    Weight 169 lb 6.4 oz (76.8 kg)    Waist Circumference 41 inches    Hip Circumference 39.5 inches    Waist to Hip Ratio 1.04 %    BMI (Calculated) 25.38    Single Leg Stand 3.5 seconds             Post Biometrics - 09/24/22 1119        Post  Biometrics   Height 5' 8.5" (1.74 m)    Weight 176 lb 11.2 oz (80.2 kg)    Waist Circumference 42 inches    Hip Circumference 40 inches    Waist to Hip Ratio 1.05 %    BMI (Calculated) 26.47    Single Leg Stand 3.7 seconds             Nutrition Therapy Plan and Nutrition Goals:  Nutrition Therapy & Goals - 07/16/22 1204       Nutrition Therapy   Diet Heart healthy, low Na, T2DM MNT    Drug/Food Interactions Statins/Certain Fruits    Protein (specify units)  80-90g    Fiber 21 grams    Whole Grain Foods 3 servings    Saturated Fats 12 max. grams    Fruits and Vegetables 8 servings/day    Sodium 1.5 grams      Personal Nutrition Goals   Nutrition Goal ST: eat at least 1 protien source at each meal and snack, have a consistent meal/snack at dinner time LT: meet energy and protein needs, continue with heart healthy diet    Comments [age].o. M admitted to cardiac rehab s/p chronic systolic HF. PMHx includes T2DM, CAD, HTN, ,malignant neoplasm of prostate (2016), HLD.  Relevant medications includes lipitor, jardiance, lasix, tradjenta, ativan, zofran, trazodone. PYP Score: 66. Vegetables & Fruits 9/12. Breads, Grains & Cereals 10/12. Red & Processed Meat 10/12. Poultry 2/2. Fish & Shellfish 1/4. Beans, Nuts & Seeds 2/4. Milk & Dairy Foods 4/6. Toppings, Oils, Seasonings & Salt 13/20. Sweets, Snacks & Restaurant Food 6/14. Beverages 9/10. B: medication with glucerna (1/2 now, 1/2 afternoon) with whole wheat toast.  L: cabbage and green beans with sometimes rice D: sometimes will not eat dinner, sometimes will go out, he eats red meat 1x/week. He also gets canned salmon to have during the week such as salmon and grits. He does not use added salt and he likes adding vinegar. He loves Cozine beans - suggested to include these with his vegetables at lunch to boost the protein. Discussed heart healthy eating and T2DM MNT.      Intervention Plan   Intervention Prescribe, educate and counsel regarding individualized specific dietary modifications aiming towards targeted core components such as weight, hypertension, lipid management, diabetes, heart failure and other comorbidities.    Expected Outcomes Short Term Goal: Understand basic principles of dietary content, such as calories, fat, sodium, cholesterol and nutrients.;Short Term Goal: A plan has been developed with personal nutrition goals set during dietitian appointment.;Long Term Goal: Adherence to prescribed  nutrition plan.             Nutrition Assessments:  MEDIFICTS Score Key: ?70 Need to make dietary changes  40-70 Heart Healthy Diet ? 40 Therapeutic Level Cholesterol Diet  Flowsheet Row Cardiac Rehab from 09/29/2022 in [facility] Cardiac and Pulmonary Rehab  Picture Your Plate Total Score on Admission 66  Picture Your Plate Total Score on Discharge 76      Picture Your Plate Scores: [id number] Unhealthy dietary pattern with much room for improvement. 41-50 Dietary pattern unlikely to meet recommendations for good health and room for improvement. 51-60 More healthful dietary pattern, with some room for improvement.  >60 Healthy dietary pattern, although there may be some specific behaviors that could be improved.    Nutrition Goals Re-Evaluation:  Nutrition Goals Re-Evaluation     [place] Name 08/20/22 1143 09/10/22 1137           Goals   Nutrition Goal -- Short: choose heart healthy snacks in between meals. Long: independently manage heart healthy diet.      Comment [name] has added more protein to his snacks. His favorite snack is cheese crackers, and he is monitoring his intake of sodium. He reports he has been trying to eat more consistent times [name] is doing well in rehab.  He is starting to get his appetitie back, but taste still aren't back to where he was before.  He is hoping that will still come back.  He continues to add in more protein daily into his snacks.  He is still watching his sodium intake.      Expected Outcome Short: choose heart healthy snacks in between meals. Long: independently manage heart healthy diet. Short: Continue to monitor sodium intake Long: Continue to add in more protein.               Nutrition Goals Discharge (Final Nutrition Goals Re-Evaluation):  Nutrition Goals Re-Evaluation - 09/10/22 1137       Goals   Nutrition Goal Short: choose heart healthy snacks in between meals. Long: independently manage heart healthy diet.    Comment  [name] is doing well in rehab.  He is starting to get his appetitie back, but  taste still aren't back to where he was before.  He is hoping that will still come back.  He continues to add in more protein daily into his snacks.  He is still watching his sodium intake.    Expected Outcome Short: Continue to monitor sodium intake Long: Continue to add in more protein.             Psychosocial: Target Goals: Acknowledge presence or absence of significant depression and/or stress, maximize coping skills, provide positive support system. Participant is able to verbalize types and ability to use techniques and skills needed for reducing stress and depression.   Education: Stress, Anxiety, and Depression - Group verbal and visual presentation to define topics covered.  Reviews how body is impacted by stress, anxiety, and depression.  Also discusses healthy ways to reduce stress and to treat/manage anxiety and depression.  Written material given at graduation. Flowsheet Row Cardiac Rehab from 09/24/2022 in [facility] Cardiac and Pulmonary Rehab  Date 08/13/22  Educator [facility]  Instruction Review Code 1- [employer] Understanding       Education: Sleep Hygiene -Provides group verbal and written instruction about how sleep can affect your health.  Define sleep hygiene, discuss sleep cycles and impact of sleep habits. Review good sleep hygiene tips.    Initial Review & Psychosocial Screening:  Initial Psych Review & Screening - 06/17/22 [place]? Yes   Joelene Millin his care giver, no immediate family     Barriers   Psychosocial barriers to participate in program There are no identifiable barriers or psychosocial needs.      Screening Interventions   Interventions Encouraged to exercise;To provide support and resources with identified psychosocial needs;Provide feedback about the scores to participant    Expected Outcomes Short Term goal: Utilizing psychosocial  counselor, staff and physician to assist with identification of specific Stressors or current issues interfering with healing process. Setting desired goal for each stressor or current issue identified.;Long Term Goal: Stressors or current issues are controlled or eliminated.;Short Term goal: Identification and review with participant of any Quality of Life or Depression concerns found by scoring the questionnaire.;Long Term goal: The participant improves quality of Life and PHQ9 Scores as seen by post scores and/or verbalization of changes             Quality of Life Scores:   Quality of Life - 09/29/22 1138       Quality of Life   Select Quality of Life      Quality of Life Scores   Health/Function Pre 6.86 %    Health/Function Post 19.83 %    Health/Function % Change 189.07 %    Socioeconomic Pre 30 %    Socioeconomic Post 20.33 %    Socioeconomic % Change  -32.23 %    Psych/Spiritual Pre 17 %    Psych/Spiritual Post 20.57 %    Psych/Spiritual % Change 21 %    Family Pre 14 %    Family Post 16.25 %    Family % Change 16.07 %    GLOBAL Pre 13.93 %    GLOBAL Post 19.64 %    GLOBAL % Change 40.99 %            Scores of 19 and below usually indicate a poorer quality of life in these areas.  A difference of  2-3 points is a clinically meaningful difference.  A difference of 2-3 points in the total  score of the Quality of Life Index has been associated with significant improvement in overall quality of life, self-image, physical symptoms, and general health in studies assessing change in quality of life.  PHQ-9: Review Flowsheet  More data exists      09/29/2022 07/21/2022 06/25/2022 02/27/2022 01/20/2022  Depression screen PHQ 2/9  Decreased Interest 1 1 1 $ 0 0  Down, Depressed, Hopeless 1 0 1 0 0  PHQ - 2 Score 2 1 2 $ 0 0  Altered sleeping 1 0 3 - -  Tired, decreased energy 1 1 3 $ - -  Change in appetite 1 0 0 - -  Feeling bad or failure about yourself  1 0 1 - -  Trouble  concentrating 0 0 0 - -  Moving slowly or fidgety/restless 0 0 0 - -  Suicidal thoughts 0 0 1 - -  PHQ-9 Score 6 2 10 $ - -  Difficult doing work/chores Somewhat difficult Somewhat difficult Very difficult - -   Interpretation of Total Score  Total Score Depression Severity:  1-4 = Minimal depression, 5-9 = Mild depression, 10-14 = Moderate depression, 15-19 = Moderately severe depression, 20-27 = Severe depression   Psychosocial Evaluation and Intervention:  Psychosocial Evaluation - 06/17/22 1602       Psychosocial Evaluation & Interventions   Interventions Encouraged to exercise with the program and follow exercise prescription    Comments Olton has no barriers to attending the program. He is ready to start exercising as he has not done much in the past year. He lives alone. He has support from his caregiver of 8 years,Kimberly, and distant relatives. He is attending for a diagnosis of Heart failure and he does weigh himself every day and watches for daily weight gain over 2 pounds. He knows to call his doctor if that gain occurs. He should do well with the program.    Expected Outcomes STG Kinnith attends all scheduled sessions, he returns to a routine exercise program. LTG Lynann Bologna after discharge will continue with his exercise progression.    Continue Psychosocial Services  Follow up required by staff             Psychosocial Re-Evaluation:  Psychosocial Re-Evaluation     [place] Name 07/23/22 1114 08/20/22 1139 09/10/22 1135         Psychosocial Re-Evaluation   Current issues with None Identified None Identified None Identified     Comments [name] reports enjoying the program. He feels stronger, but still wants to increase his stamina. He still is having chronic sleep issues, he doesn't like his cpap and doesn't feel like he sleeps well nightly. His MD is aware. 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 He used to golf for fun but doesn't feel up for it so now he enjoys  watching movies for fun. [name] is still enjoing the program. He has noticed after the increase in his spirinolactone and attending the program more, he feels stronger and more stable. He feels his fluid balance is well controlled as well as his blood pressure. He wants to get outside more and feels more down when the weather is bad. He enjoys watching movies during the dreary weather, but is ready for spring so he can get outiside more. He has completed his home exercise discussion with the EP and feels like he will be more ready to get started when warmer weather comes. [name] is doing well in rehab.  He does not have any major stressors other than his  health.  His biggest concern currently is his hip. He wants to keep moving and building strength so he doesn't hurt.  He is sleeping welll and getting out to walk on nice weather days.     Expected Outcomes Short: continue to attend cardiac rehab for education and exercise. Long: maintain positive self care habits. Short: try to get outside when the weather is pretty. Long: line up self care habits for bad weather days. Short: Continue to walk for mental boost Long: Continue to stay positive     Interventions -- -- Encouraged to attend Cardiac Rehabilitation for the exercise     Continue Psychosocial Services  Follow up required by staff Follow up required by staff Follow up required by staff              Psychosocial Discharge (Final Psychosocial Re-Evaluation):  Psychosocial Re-Evaluation - 09/10/22 1135       Psychosocial Re-Evaluation   Current issues with None Identified    Comments [name] is doing well in rehab.  He does not have any major stressors other than his health.  His biggest concern currently is his hip. He wants to keep moving and building strength so he doesn't hurt.  He is sleeping welll and getting out to walk on nice weather days.    Expected Outcomes Short: Continue to walk for mental boost Long: Continue to stay  positive    Interventions Encouraged to attend Cardiac Rehabilitation for the exercise    Continue Psychosocial Services  Follow up required by staff             Vocational Rehabilitation: Provide vocational rehab assistance to qualifying candidates.   Vocational Rehab Evaluation & Intervention:   Education: Education Goals: Education classes will be provided on a variety of topics geared toward better understanding of heart health and risk factor modification. Participant will state understanding/return demonstration of topics presented as noted by education test scores.  Learning Barriers/Preferences:   General Cardiac Education Topics:  AED/CPR: - Group verbal and written instruction with the use of models to demonstrate the basic use of the AED with the basic ABC's of resuscitation.   Anatomy and Cardiac Procedures: - Group verbal and visual presentation and models provide information about basic cardiac anatomy and function. Reviews the testing methods done to diagnose heart disease and the outcomes of the test results. Describes the treatment choices: Medical Management, Angioplasty, or Coronary Bypass Surgery for treating various heart conditions including Myocardial Infarction, Angina, Valve Disease, and Cardiac Arrhythmias.  Written material given at graduation. Flowsheet Row Cardiac Rehab from 09/24/2022 in [facility] Cardiac and Pulmonary Rehab  Education need identified 06/25/22  Date 09/17/22  Educator SB  Instruction Review Code 1- Verbalizes Understanding       Medication Safety: - Group verbal and visual instruction to review commonly prescribed medications for heart and lung disease. Reviews the medication, class of the drug, and side effects. Includes the steps to properly store meds and maintain the prescription regimen.  Written material given at graduation. Flowsheet Row Cardiac Rehab from 09/24/2022 in [facility] Cardiac and Pulmonary Rehab  Date 07/23/22  Educator  SB  Instruction Review Code 1- Verbalizes Understanding       Intimacy: - Group verbal instruction through game format to discuss how heart and lung disease can affect sexual intimacy. Written material given at graduation..   Know Your Numbers and Heart Failure: - Group verbal and visual instruction to discuss disease risk factors for cardiac and pulmonary disease and  treatment options.  Reviews associated critical values for Overweight/Obesity, Hypertension, Cholesterol, and Diabetes.  Discusses basics of heart failure: signs/symptoms and treatments.  Introduces Heart Failure Zone chart for action plan for heart failure.  Written material given at graduation. Flowsheet Row Cardiac Rehab from 09/24/2022 in [facility] Cardiac and Pulmonary Rehab  Date 07/30/22  Educator SB  Instruction Review Code 1- Verbalizes Understanding       Infection Prevention: - Provides verbal and written material to individual with discussion of infection control including proper hand washing and proper equipment cleaning during exercise session. Flowsheet Row Cardiac Rehab from 09/24/2022 in [facility] Cardiac and Pulmonary Rehab  Education need identified 06/25/22  Date 06/25/22  Educator [place]  Instruction Review Code 1- Verbalizes Understanding       Falls Prevention: - Provides verbal and written material to individual with discussion of falls prevention and safety. Flowsheet Row Cardiac Rehab from 06/17/2022 in [facility] Cardiac and Pulmonary Rehab  Date 06/17/22  Educator SB  Instruction Review Code 1- Verbalizes Understanding       Other: -Provides group and verbal instruction on various topics (see comments) Flowsheet Row Cardiac Rehab from 09/24/2022 in [facility] Cardiac and Pulmonary Rehab  Date 09/24/22  Educator SB  Instruction Review Code 1- Verbalizes Understanding       Knowledge Questionnaire Score:  Knowledge Questionnaire Score - 09/29/22 1138       Knowledge Questionnaire Score   Pre Score  24/26    Post Score 24/26             Core Components/Risk Factors/Patient Goals at Admission:  Personal Goals and Risk Factors at Admission - 06/25/22 1621       Core Components/Risk Factors/Patient Goals on Admission    Weight Management Yes;Weight Maintenance    Intervention Weight Management: Develop a combined nutrition and exercise program designed to reach desired caloric intake, while maintaining appropriate intake of nutrient and fiber, sodium and fats, and appropriate energy expenditure required for the weight goal.;Weight Management: Provide education and appropriate resources to help participant work on and attain dietary goals.    Admit Weight 169 lb (76.7 kg)    Goal Weight: Short Term 169 lb (76.7 kg)    Goal Weight: Long Term 169 lb (76.7 kg)    Expected Outcomes Short Term: Continue to assess and modify interventions until short term weight is achieved;Long Term: Adherence to nutrition and physical activity/exercise program aimed toward attainment of established weight goal;Weight Maintenance: Understanding of the daily nutrition guidelines, which includes 25-35% calories from fat, 7% or less cal from saturated fats, less than 244m cholesterol, less than 1.5gm of sodium, & 5 or more servings of fruits and vegetables daily;Understanding recommendations for meals to include 15-35% energy as protein, 25-35% energy from fat, 35-60% energy from carbohydrates, less than 2053mof dietary cholesterol, 20-35 gm of total fiber daily;Understanding of distribution of calorie intake throughout the day with the consumption of 4-5 meals/snacks    Diabetes Yes    Intervention Provide education about signs/symptoms and action to take for hypo/hyperglycemia.;Provide education about proper nutrition, including hydration, and aerobic/resistive exercise prescription along with prescribed medications to achieve blood glucose in normal ranges: Fasting glucose 65-99 mg/dL    Expected Outcomes Short  Term: Participant verbalizes understanding of the signs/symptoms and immediate care of hyper/hypoglycemia, proper foot care and importance of medication, aerobic/resistive exercise and nutrition plan for blood glucose control.;Long Term: Attainment of HbA1C < 7%.    Heart Failure Yes    Intervention Provide a  combined exercise and nutrition program that is supplemented with education, support and counseling about heart failure. Directed toward relieving symptoms such as shortness of breath, decreased exercise tolerance, and extremity edema.    Expected Outcomes Improve functional capacity of life;Short term: Attendance in program 2-3 days a week with increased exercise capacity. Reported lower sodium intake. Reported increased fruit and vegetable intake. Reports medication compliance.;Short term: Daily weights obtained and reported for increase. Utilizing diuretic protocols set by physician.;Long term: Adoption of self-care skills and reduction of barriers for early signs and symptoms recognition and intervention leading to self-care maintenance.    Hypertension Yes    Intervention Provide education on lifestyle modifcations including regular physical activity/exercise, weight management, moderate sodium restriction and increased consumption of fresh fruit, vegetables, and low fat dairy, alcohol moderation, and smoking cessation.;Monitor prescription use compliance.    Expected Outcomes Short Term: Continued assessment and intervention until BP is < 140/62m HG in hypertensive participants. < 130/818mHG in hypertensive participants with diabetes, heart failure or chronic kidney disease.;Long Term: Maintenance of blood pressure at goal levels.    Lipids Yes    Intervention Provide education and support for participant on nutrition & aerobic/resistive exercise along with prescribed medications to achieve LDL <7054mHDL >14m60m  Expected Outcomes Short Term: Participant states understanding of desired  cholesterol values and is compliant with medications prescribed. Participant is following exercise prescription and nutrition guidelines.;Long Term: Cholesterol controlled with medications as prescribed, with individualized exercise RX and with personalized nutrition plan. Value goals: LDL < 70mg23mL > 40 mg.             Education:Diabetes - Individual verbal and written instruction to review signs/symptoms of diabetes, desired ranges of glucose level fasting, after meals and with exercise. Acknowledge that pre and post exercise glucose checks will be done for 3 sessions at entry of program. [facility]  Education need identified 06/25/22  Date 06/25/22  Educator KL  IBlue Ridgetruction Review Code 1- Verbalizes Understanding       Core Components/Risk Factors/Patient Goals Review:   Goals and Risk Factor Review     Row Name 07/23/22 1107 08/20/22 1131 09/10/22 1140         Core Components/Risk Factors/Patient Goals Review   Personal Goals Review Diabetes;Heart Failure;Hypertension;Lipids Diabetes;Heart Failure;Hypertension;Lipids Diabetes;Heart Failure;Hypertension;Lipids;Weight Management/Obesity     Review Saw heart failure team yesterday and they increased his spirinolactone some and did a repeat echo. He thinks his little weight gain is not fluid, but that he has been eating more lately. He doesn't have any other signs of fluid over load right now. His diabetes is being well managed. He doesn't have to check it every day, but knows the symptoms of low/high blood sugar. His blood pressure has been stable in the program so far. MarshRuthann Cancerrts doing well with maintaining his fluid balance. His medication is going well. His most recent echo showed his EF improved from 25-30% to 40-45%. He is pleased with this. He hasn't checked his sugar lately but feels like it is going well and knows what symptoms to look out for. His main  goal is to increase his stamina. He states he likes to exercise outside and it has been hard with the dreary weather. He is motivated to keep coming and finish the program MarshRuthann Canceroing well in rehab.  He had a nurse follow up last week to review health and medications.  He denies  any swelling or shortness of breath.  His weight is staying steady too.  He has not had any swings with his sugars that he is aware of.  He is planning to ask for an A1c at his next PCP check.  His pressures are doing well as well.     Expected Outcomes Short: adhere to medication change and daily weights. Long: independenlty manage risk factors. Short: attend cardiac rehab for education and exercise. Long: independnelty manage healthy lifestyle Short: Ask about A1c check Long:Continue to monitor risk factors              Core Components/Risk Factors/Patient Goals at Discharge (Final Review):   Goals and Risk Factor Review - 09/10/22 1140       Core Components/Risk Factors/Patient Goals Review   Personal Goals Review Diabetes;Heart Failure;Hypertension;Lipids;Weight Management/Obesity    Review [name] is doing well in rehab.  He had a nurse follow up last week to review health and medications.  He denies any swelling or shortness of breath.  His weight is staying steady too.  He has not had any swings with his sugars that he is aware of.  He is planning to ask for an A1c at his next PCP check.  His pressures are doing well as well.    Expected Outcomes Short: Ask about A1c check Long:Continue to monitor risk factors             ITP Comments:  ITP Comments     Row Name 06/17/22 1608 06/25/22 1602 06/30/22 1055 07/08/22 1102 07/16/22 1257   ITP Comments Virtual orientation call completed today. he has an appointment on Date: 06/25/2022  for EP eval and gym Orientation.  Documentation of diagnosis can be found in [facility] Date: 05/28/2022 . Completed 6MWT and gym orientation. Initial ITP created and sent for  review to Dr. Emily Filbert, Medical Director. First full day of exercise!  Patient was oriented to gym and equipment including functions, settings, policies, and procedures.  Patient's individual exercise prescription and treatment plan were reviewed.  All starting workloads were established based on the results of the 6 minute walk test done at initial orientation visit.  The plan for exercise progression was also introduced and progression will be customized based on patient's performance and goals. 30 Day review completed. Medical Director ITP review done, changes made as directed, and signed approval by Medical Director.   new to program Completed initial RD consultation    [place] Name 08/05/22 1128 09/02/22 1102 09/30/22 1527       ITP Comments 30 Day review completed. Medical Director ITP review done, changes made as directed, and signed approval by Medical Director. 30 Day review completed. Medical Director ITP review done, changes made as directed, and signed approval by Medical Director. 30 day review completed. ITP sent to Dr. Emily Filbert, Medical Director of Cardiac Rehab. Continue with ITP unless changes are made by physician.              Comments: 30 day review

## 2022-10-01 ENCOUNTER — Encounter: Payer: Medicare Other | Admitting: *Deleted

## 2022-10-01 DIAGNOSIS — I5022 Chronic systolic (congestive) heart failure: Secondary | ICD-10-CM

## 2022-10-01 NOTE — Progress Notes (Signed)
Daily Session Note  Patient Details  Name: Antonio Clark MRN: [id number] Date of Birth: 09/05/1935 Referring Provider:   Flowsheet Row Cardiac Rehab from 06/25/2022 in [facility] Cardiac and Pulmonary Rehab  Referring Provider Loralie Champagne MD       Encounter Date: 10/01/2022  Check In:  Session Check In - 10/01/22 1109       Check-In   Supervising physician immediately available to respond to emergencies See telemetry face sheet for immediately available ER MD    Location [facility]-Cardiac & Pulmonary Rehab    Staff Present Darlyne Russian, RN, ADN;Jessica Luan Pulling, MA, RCEP, CCRP, Bertram Gala, MS, ACSM CEP, Exercise Physiologist    Virtual Visit No    Medication changes reported     No    Fall or balance concerns reported    No    Warm-up and Cool-down Performed on first and last piece of equipment    Resistance Training Performed Yes    VAD Patient? No    PAD/SET Patient? No      Pain Assessment   Currently in Pain? No/denies                Social History   Tobacco Use  Smoking Status Never  Smokeless Tobacco Never    Goals Met:  Independence with exercise equipment Exercise tolerated well No report of concerns or symptoms today Strength training completed today  Goals Unmet:  Not Applicable  Comments: Pt able to follow exercise prescription today without complaint.  Will continue to monitor for progression.    Dr. Emily Filbert is Medical Director for [place].  Dr. Ottie Glazier is Medical Director for [facility] Pulmonary Rehabilitation.

## 2022-10-01 NOTE — Patient Instructions (Signed)
Discharge Patient Instructions  Patient Details  Name: Antonio Clark MRN: [id number] Date of Birth: 02-05-1936 Referring Provider:  Susy Frizzle, MD   Number of Visits: 37  Reason for Discharge:  Patient reached a stable level of exercise. Patient independent in their exercise. Patient has met program and personal goals.  Smoking History:  Social History   Tobacco Use  Smoking Status Never  Smokeless Tobacco Never    Diagnosis:  No diagnosis found.  Initial Exercise Prescription:  Initial Exercise Prescription - 06/25/22 1600       Date of Initial Exercise RX and Referring Provider   Date 06/25/22    Referring Provider Loralie Champagne MD      Recumbant Bike   Level 1    RPM 60    Watts 18    Minutes 15    METs 1.6      NuStep   Level 1    SPM 80    Minutes 15    METs 1.6      T5 Nustep   Level 1    SPM 80    Minutes 15    METs 1.6      Track   Laps 20    Minutes 15    METs 2.09      Prescription Details   Frequency (times per week) 2    Duration Progress to 30 minutes of continuous aerobic without signs/symptoms of physical distress      Intensity   THRR 40-80% of Max Heartrate 104 - 125    Ratings of Perceived Exertion 11-13    Perceived Dyspnea 0-4      Progression   Progression Continue to progress workloads to maintain intensity without signs/symptoms of physical distress.      Resistance Training   Training Prescription Yes    Weight 3 lb    Reps 10-15             Discharge Exercise Prescription (Final Exercise Prescription Changes):  Exercise Prescription Changes - 09/23/22 1700       Response to Exercise   Blood Pressure (Admit) 118/60    Blood Pressure (Exit) 98/58    Heart Rate (Admit) 65 bpm    Heart Rate (Exercise) 108 bpm    Heart Rate (Exit) 70 bpm    Rating of Perceived Exertion (Exercise) 13    Symptoms none    Duration Continue with 30 min of aerobic exercise without signs/symptoms of physical distress.     Intensity THRR unchanged      Progression   Progression Continue to progress workloads to maintain intensity without signs/symptoms of physical distress.    Average METs 2.45      Resistance Training   Training Prescription Yes    Weight 3 lb    Reps 10-15      Interval Training   Interval Training No      Treadmill   MPH 2.5    Grade 2    Minutes 15    METs 3.6      NuStep   Level 5    Minutes 15    METs 2.8      Recumbant Elliptical   Level 2.7    Minutes 15    METs 2      Home Exercise Plan   Plans to continue exercise at [employer] (comment)   [name] plans to walk at home, and go to the [facility] to use their aerobic machines.   Frequency Add  3 additional days to program exercise sessions.    Initial Home Exercises Provided 07/28/22      Oxygen   Maintain Oxygen Saturation 88% or higher             Functional Capacity:  6 Minute Walk     Row Name 06/25/22 1613 09/24/22 1118       6 Minute Walk   Phase Initial Discharge    Distance 985 feet 1300 feet    Distance % Change -- 32 %    Distance Feet Change -- 315 ft    Walk Time 6 minutes 6 minutes    # of Rest Breaks 0 0    MPH 1.86 2.46    METS 1.67 2.29    RPE 12 12    Perceived Dyspnea  0 --    VO2 Peak 5.85 8.02    Symptoms No Yes (comment)    Comments -- hip pain 2/10    Resting HR 84 bpm 62 bpm    Resting BP 120/66 124/74    Resting Oxygen Saturation  99 % 95 %    Exercise Oxygen Saturation  during 6 min walk 97 % 92 %    Max Ex. HR 94 bpm 95 bpm    Max Ex. BP 130/62 148/74    2 Minute Post BP 122/68 --            Nutrition & Weight - Outcomes:  Pre Biometrics - 06/25/22 1612       Pre Biometrics   Height 5' 8.5" (1.74 m)    Weight 169 lb 6.4 oz (76.8 kg)    Waist Circumference 41 inches    Hip Circumference 39.5 inches    Waist to Hip Ratio 1.04 %    BMI (Calculated) 25.38    Single Leg Stand 3.5 seconds             Post Biometrics - 09/24/22 1119         Post  Biometrics   Height 5' 8.5" (1.74 m)    Weight 176 lb 11.2 oz (80.2 kg)    Waist Circumference 42 inches    Hip Circumference 40 inches    Waist to Hip Ratio 1.05 %    BMI (Calculated) 26.47    Single Leg Stand 3.7 seconds             Nutrition:  Nutrition Therapy & Goals - 07/16/22 1204       Nutrition Therapy   Diet Heart healthy, low Na, T2DM MNT    Drug/Food Interactions Statins/Certain Fruits    Protein (specify units) 80-90g    Fiber 21 grams    Whole Grain Foods 3 servings    Saturated Fats 12 max. grams    Fruits and Vegetables 8 servings/day    Sodium 1.5 grams      Personal Nutrition Goals   Nutrition Goal ST: eat at least 1 protien source at each meal and snack, have a consistent meal/snack at dinner time LT: meet energy and protein needs, continue with heart healthy diet    Comments 87 y.o. M admitted to cardiac rehab s/p chronic systolic HF. PMHx includes T2DM, CAD, HTN, ,malignant neoplasm of prostate (2016), HLD. Relevant medications includes lipitor, jardiance, lasix, tradjenta, ativan, zofran, trazodone. PYP Score: 66. Vegetables & Fruits 9/12. Breads, Grains & Cereals 10/12. Red & Processed Meat 10/12. Poultry 2/2. Fish & Shellfish 1/4. Beans, Nuts & Seeds 2/4. Milk & Dairy Foods 4/6. Toppings, Oils, Seasonings &  Salt 13/20. Sweets, Snacks & Restaurant Food 6/14. Beverages 9/10. B: medication with glucerna (1/2 now, 1/2 afternoon) with whole wheat toast.  L: cabbage and green beans with sometimes rice D: sometimes will not eat dinner, sometimes will go out, he eats red meat 1x/week. He also gets canned salmon to have during the week such as salmon and grits. He does not use added salt and he likes adding vinegar. He loves [place] beans - suggested to include these with his vegetables at lunch to boost the protein. Discussed heart healthy eating and T2DM MNT.      Intervention Plan   Intervention Prescribe, educate and counsel regarding individualized specific  dietary modifications aiming towards targeted core components such as weight, hypertension, lipid management, diabetes, heart failure and other comorbidities.    Expected Outcomes Short Term Goal: Understand basic principles of dietary content, such as calories, fat, sodium, cholesterol and nutrients.;Short Term Goal: A plan has been developed with personal nutrition goals set during dietitian appointment.;Long Term Goal: Adherence to prescribed nutrition plan.           Goals reviewed with patient; copy given to patient.

## 2022-10-02 ENCOUNTER — Other Ambulatory Visit (HOSPITAL_COMMUNITY): Payer: Self-pay

## 2022-10-02 ENCOUNTER — Telehealth (HOSPITAL_COMMUNITY): Payer: Self-pay

## 2022-10-02 NOTE — Telephone Encounter (Signed)
I have spoken with Antonio Clark about getting him set up for a grant, if possible. I will be following up with her on Tuesday 2/27

## 2022-10-02 NOTE — Telephone Encounter (Signed)
Cost of Jardiance has gone up per [name]. She wants to know if we can change or get some assistance for this cost. Can you follow up with her?

## 2022-10-06 ENCOUNTER — Other Ambulatory Visit (HOSPITAL_COMMUNITY): Payer: Self-pay

## 2022-10-06 NOTE — Telephone Encounter (Signed)
Due to the current coverage, this patient should be eligible to use a copay card for this medication. Spoke with [name] about obtaining manufacturer card to use with pharmacy. She will call back if there are any further issues.

## 2022-10-13 ENCOUNTER — Encounter: Payer: Medicare Other | Attending: Cardiology | Admitting: *Deleted

## 2022-10-13 DIAGNOSIS — I5022 Chronic systolic (congestive) heart failure: Secondary | ICD-10-CM | POA: Insufficient documentation

## 2022-10-13 NOTE — Patient Instructions (Addendum)
Discharge Patient Instructions  Patient Details  Name: Antonio Clark MRN: [id number] Date of Birth: 12/31/1935 Referring Provider:  Susy Frizzle, MD   Number of Visits: 5  Reason for Discharge:  Patient reached a stable level of exercise. Patient independent in their exercise. Patient has met program and personal goals.  Smoking History:  Social History   Tobacco Use  Smoking Status Never  Smokeless Tobacco Never    Diagnosis:  Systolic heart failure, chronic (HCC)  Initial Exercise Prescription:  Initial Exercise Prescription - 06/25/22 1600       Date of Initial Exercise RX and Referring Provider   Date 06/25/22    Referring Provider Loralie Champagne MD      Recumbant Bike   Level 1    RPM 60    Watts 18    Minutes 15    METs 1.6      NuStep   Level 1    SPM 80    Minutes 15    METs 1.6      T5 Nustep   Level 1    SPM 80    Minutes 15    METs 1.6      Track   Laps 20    Minutes 15    METs 2.09      Prescription Details   Frequency (times per week) 2    Duration Progress to 30 minutes of continuous aerobic without signs/symptoms of physical distress      Intensity   THRR 40-80% of Max Heartrate 104 - 125    Ratings of Perceived Exertion 11-13    Perceived Dyspnea 0-4      Progression   Progression Continue to progress workloads to maintain intensity without signs/symptoms of physical distress.      Resistance Training   Training Prescription Yes    Weight 3 lb    Reps 10-15             Discharge Exercise Prescription (Final Exercise Prescription Changes):  Exercise Prescription Changes - 09/23/22 1700       Response to Exercise   Blood Pressure (Admit) 118/60    Blood Pressure (Exit) 98/58    Heart Rate (Admit) 65 bpm    Heart Rate (Exercise) 108 bpm    Heart Rate (Exit) 70 bpm    Rating of Perceived Exertion (Exercise) 13    Symptoms none    Duration Continue with 30 min of aerobic exercise without signs/symptoms of  physical distress.    Intensity THRR unchanged      Progression   Progression Continue to progress workloads to maintain intensity without signs/symptoms of physical distress.    Average METs 2.45      Resistance Training   Training Prescription Yes    Weight 3 lb    Reps 10-15      Interval Training   Interval Training No      Treadmill   MPH 2.5    Grade 2    Minutes 15    METs 3.6      NuStep   Level 5    Minutes 15    METs 2.8      Recumbant Elliptical   Level 2.7    Minutes 15    METs 2      Home Exercise Plan   Plans to continue exercise at [employer] (comment)   [name] plans to walk at home, and go to the [facility] to use their aerobic machines.  Frequency Add 3 additional days to program exercise sessions.    Initial Home Exercises Provided 07/28/22      Oxygen   Maintain Oxygen Saturation 88% or higher             Functional Capacity:  6 Minute Walk     Row Name 06/25/22 1613 09/24/22 1118       6 Minute Walk   Phase Initial Discharge    Distance 985 feet 1300 feet    Distance % Change -- 32 %    Distance Feet Change -- 315 ft    Walk Time 6 minutes 6 minutes    # of Rest Breaks 0 0    MPH 1.86 2.46    METS 1.67 2.29    RPE 12 12    Perceived Dyspnea  0 --    VO2 Peak 5.85 8.02    Symptoms No Yes (comment)    Comments -- hip pain 2/10    Resting HR 84 bpm 62 bpm    Resting BP 120/66 124/74    Resting Oxygen Saturation  99 % 95 %    Exercise Oxygen Saturation  during 6 min walk 97 % 92 %    Max Ex. HR 94 bpm 95 bpm    Max Ex. BP 130/62 148/74    2 Minute Post BP 122/68 --              Nutrition & Weight - Outcomes:  Pre Biometrics - 06/25/22 1612       Pre Biometrics   Height 5' 8.5" (1.74 m)    Weight 169 lb 6.4 oz (76.8 kg)    Waist Circumference 41 inches    Hip Circumference 39.5 inches    Waist to Hip Ratio 1.04 %    BMI (Calculated) 25.38    Single Leg Stand 3.5 seconds             Post Biometrics -  09/24/22 1119        Post  Biometrics   Height 5' 8.5" (1.74 m)    Weight 176 lb 11.2 oz (80.2 kg)    Waist Circumference 42 inches    Hip Circumference 40 inches    Waist to Hip Ratio 1.05 %    BMI (Calculated) 26.47    Single Leg Stand 3.7 seconds             Nutrition:  Nutrition Therapy & Goals - 07/16/22 1204       Nutrition Therapy   Diet Heart healthy, low Na, T2DM MNT    Drug/Food Interactions Statins/Certain Fruits    Protein (specify units) 80-90g    Fiber 21 grams    Whole Grain Foods 3 servings    Saturated Fats 12 max. grams    Fruits and Vegetables 8 servings/day    Sodium 1.5 grams      Personal Nutrition Goals   Nutrition Goal ST: eat at least 1 protien source at each meal and snack, have a consistent meal/snack at dinner time LT: meet energy and protein needs, continue with heart healthy diet    Comments 87 y.o. M admitted to cardiac rehab s/p chronic systolic HF. PMHx includes T2DM, CAD, HTN, ,malignant neoplasm of prostate (2016), HLD. Relevant medications includes lipitor, jardiance, lasix, tradjenta, ativan, zofran, trazodone. PYP Score: 66. Vegetables & Fruits 9/12. Breads, Grains & Cereals 10/12. Red & Processed Meat 10/12. Poultry 2/2. Fish & Shellfish 1/4. Beans, Nuts & Seeds 2/4. Milk & Dairy Foods  4/6. Toppings, Oils, Seasonings & Salt 13/20. Sweets, Snacks & Restaurant Food 6/14. Beverages 9/10. B: medication with glucerna (1/2 now, 1/2 afternoon) with whole wheat toast.  L: cabbage and green beans with sometimes rice D: sometimes will not eat dinner, sometimes will go out, he eats red meat 1x/week. He also gets canned salmon to have during the week such as salmon and grits. He does not use added salt and he likes adding vinegar. He loves [place] beans - suggested to include these with his vegetables at lunch to boost the protein. Discussed heart healthy eating and T2DM MNT.      Intervention Plan   Intervention Prescribe, educate and counsel regarding  individualized specific dietary modifications aiming towards targeted core components such as weight, hypertension, lipid management, diabetes, heart failure and other comorbidities.    Expected Outcomes Short Term Goal: Understand basic principles of dietary content, such as calories, fat, sodium, cholesterol and nutrients.;Short Term Goal: A plan has been developed with personal nutrition goals set during dietitian appointment.;Long Term Goal: Adherence to prescribed nutrition plan.              Goals reviewed with patient; copy given to patient.

## 2022-10-13 NOTE — Progress Notes (Addendum)
Daily Session Note  Patient Details  Name: Antonio Clark MRN: [id number] Date of Birth: [birth year] Referring Provider:   Flowsheet Row Cardiac Rehab from 06/25/2022 in [facility] Cardiac and Pulmonary Rehab  Referring Provider Loralie Champagne MD       Encounter Date: 10/13/2022  Check In:  Session Check In - 10/13/22 1148       Check-In   Supervising physician immediately available to respond to emergencies See telemetry face sheet for immediately available ER MD    Location [facility]-Cardiac & Pulmonary Rehab    Staff Present Heath Lark, RN, BSN, CCRP;Jessica Beaver, MA, RCEP, CCRP, Bertram Gala, MS, ACSM CEP, Exercise Physiologist    Virtual Visit No    Medication changes reported     No    Fall or balance concerns reported    No    Warm-up and Cool-down Performed on first and last piece of equipment    Resistance Training Performed Yes    VAD Patient? No    PAD/SET Patient? No      Pain Assessment   Currently in Pain? No/denies                Social History   Tobacco Use  Smoking Status Never  Smokeless Tobacco Never    Goals Met:  Independence with exercise equipment Exercise tolerated well No report of concerns or symptoms today  Goals Unmet:  Not Applicable  Comments: Pt able to follow exercise prescription today without complaint.  Will continue to monitor for progression.   Tejas graduated today from  rehab with 36 sessions completed.  Details of the patient's exercise prescription and what He needs to do in order to continue the prescription and progress were discussed with patient.  Patient was given a copy of prescription and goals.  Patient verbalized understanding.  Ellijah plans to continue to exercise by walking at home.   Dr. Emily Filbert is Medical Director for [place].  Dr. Ottie Glazier is Medical Director for [facility] Pulmonary Rehabilitation.

## 2022-10-13 NOTE — Progress Notes (Signed)
Discharge Note:  Antonio Clark "[name]" [name]  DOB 2035-11-25  Antonio Clark graduated today from  rehab with 36 sessions completed.  Details of the patient's exercise prescription and what He needs to do in order to continue the prescription and progress were discussed with patient.  Patient was given a copy of prescription and goals.  Patient verbalized understanding.  [name] plans to continue to exercise by walking at home.   [name] Name 06/25/22 1613 09/24/22 1118       6 Minute Walk   Phase Initial Discharge    Distance 985 feet 1300 feet    Distance % Change -- 32 %    Distance Feet Change -- 315 ft    Walk Time 6 minutes 6 minutes    # of Rest Breaks 0 0    MPH 1.86 2.46    METS 1.67 2.29    RPE 12 12    Perceived Dyspnea  0 --    VO2 Peak 5.85 8.02    Symptoms No Yes (comment)    Comments -- hip pain 2/10    Resting HR 84 bpm 62 bpm    Resting BP 120/66 124/74    Resting Oxygen Saturation  99 % 95 %    Exercise Oxygen Saturation  during 6 min walk 97 % 92 %    Max Ex. HR 94 bpm 95 bpm    Max Ex. BP 130/62 148/74    2 Minute Post BP 122/68 --            Thank you for the referral.

## 2022-10-13 NOTE — Progress Notes (Signed)
Cardiac Individual Treatment Plan  Patient Details  Name: KHYRAN ENGBERG MRN: [id number] Date of Birth: [birth year] Referring Provider:   Flowsheet Row Cardiac Rehab from 06/25/2022 in [facility] Cardiac and Pulmonary Rehab  Referring Provider Loralie Champagne MD       Initial Encounter Date:  Flowsheet Row Cardiac Rehab from 06/25/2022 in [facility] Cardiac and Pulmonary Rehab  Date 06/25/22       Visit Diagnosis: Systolic heart failure, chronic ([place])  Patient's Home Medications on Admission:  Current Outpatient Medications:    acetaminophen (TYLENOL) 325 MG tablet, Take 2 tablets (650 mg total) by mouth every 6 (six) hours as needed for mild pain (or Fever >/= 101)., Disp: , Rfl:    amiodarone (PACERONE) 200 MG tablet, Take 0.5 tablets (100 mg total) by mouth daily., Disp: 45 tablet, Rfl: 3   atorvastatin (LIPITOR) 80 MG tablet, Take 1 tablet (80 mg total) by mouth daily., Disp: 90 tablet, Rfl: 3   ELIQUIS 2.5 MG TABS tablet, TAKE 1 TABLET BY MOUTH TWICE A DAY, Disp: 180 tablet, Rfl: 1   empagliflozin (JARDIANCE) 10 MG TABS tablet, Take 1 tablet (10 mg total) by mouth daily before breakfast., Disp: 90 tablet, Rfl: 3   finasteride (PROSCAR) 5 MG tablet, Take 5 mg by mouth daily., Disp: , Rfl:    fluticasone (FLONASE) 50 MCG/ACT nasal spray, Place 1 spray into both nostrils daily as needed for allergies or rhinitis., Disp: , Rfl:    furosemide (LASIX) 40 MG tablet, Take 0.5 tablets (20 mg total) by mouth daily., Disp: 90 tablet, Rfl: 3   linagliptin (TRADJENTA) 5 MG TABS tablet, TAKE 1 TABLET (5 MG TOTAL) BY MOUTH DAILY., Disp: 90 tablet, Rfl: 0   LORazepam (ATIVAN) 0.5 MG tablet, TAKE 1 TABLET BY MOUTH TWICE A DAY AS NEEDED FOR ANXIETY, Disp: 30 tablet, Rfl: 1   metoprolol succinate (TOPROL-XL) 50 MG 24 hr tablet, Take 0.5 tablets (25 mg total) by mouth at bedtime. Take with or immediately following a meal., Disp: 30 tablet, Rfl: 5   nitroGLYCERIN (NITROSTAT) 0.4 MG SL tablet, Place 1 tablet (0.4  mg total) under the tongue every 5 (five) minutes x 3 doses as needed for chest pain., Disp: 25 tablet, Rfl: 12   ondansetron (ZOFRAN) 4 MG tablet, Take 1 tablet (4 mg total) by mouth every 6 (six) hours as needed for nausea., Disp: 20 tablet, Rfl: 0   oxymetazoline (AFRIN) 0.05 % nasal spray, Place 1 spray into both nostrils as needed for congestion., Disp: , Rfl:    spironolactone (ALDACTONE) 25 MG tablet, Take 1 tablet (25 mg total) by mouth at bedtime., Disp: 45 tablet, Rfl: 3   traZODone (DESYREL) 50 MG tablet, TAKE 0.5 TABLETS BY MOUTH AT BEDTIME AS NEEDED FOR SLEEP., Disp: 45 tablet, Rfl: 3  Past Medical History: Past Medical History:  Diagnosis Date   Anal fissure    Arthritis    Atrial fibrillation (HCC)    Back pain    CHF (congestive heart failure) (HCC)    Colon polyps    Coronary artery disease    Diabetes mellitus type 2 with complications ([place])    Dysrhythmia    a-fib   GERD (gastroesophageal reflux disease)    History of echocardiogram 07/ 07/ 2011   History of lithotripsy 1989   Hyperlipidemia    Hypertension    Kidney stones    Melanoma ([place]) 05/01/2019   right chest wall (12/20)   Prostate CA ([place]) 2010   Sleep apnea  uses CPAP nightly   Squamous cell carcinoma of skin 05/23/1992   bowens-left parietal scalp ([id number])   Squamous cell carcinoma of skin 03/14/2008   in situ-left upper outer forehead-medial ([id number])   Squamous cell carcinoma of skin 03/14/2008   in situ-crown of scalp ([id number])   Squamous cell carcinoma of skin 04/15/2011   in situ-right dorsal forearm (txpbx)   Squamous cell carcinoma of skin 10/16/2011   in situ-left sideburn   Squamous cell carcinoma of skin 03/11/2015   ka-left sideburn ([id number])   Squamous cell carcinoma of skin 03/11/2015   ka-left forearm ([id number])   Squamous cell carcinoma of skin 06/22/2018   in situ-left forearm, sup (txpbx)   Squamous cell carcinoma of skin 05/01/2019   in situ-mid anterior scalp    Squamous  cell carcinoma of skin 05/01/2019   in situ-right upper arm    Tobacco Use: Social History   Tobacco Use  Smoking Status Never  Smokeless Tobacco Never    Labs: Review Flowsheet  More data exists      Latest Ref Rng & Units 12/17/2020 05/05/2021 10/30/2021 02/09/2022 05/28/2022  Labs for ITP Cardiac and Pulmonary Rehab  Cholestrol 0 - 200 mg/dL 112  108  103  - 142   LDL (calc) 0 - 99 mg/dL 51  45  38  - 54   HDL-C >40 mg/dL 37  43  48  - 55   Trlycerides <150 mg/dL 158  116  83  - 167   Hemoglobin A1c 4.8 - 5.6 % 6.7  6.6  6.3  6.4  -     Exercise Target Goals: Exercise Program Goal: Individual exercise prescription set using results from initial 6 min walk test and THRR while considering  patient's activity barriers and safety.   Exercise Prescription Goal: Initial exercise prescription builds to 30-45 minutes a day of aerobic activity, 2-3 days per week.  Home exercise guidelines will be given to patient during program as part of exercise prescription that the participant will acknowledge.   Education: Aerobic Exercise: - Group verbal and visual presentation on the components of exercise prescription. Introduces F.I.T.T principle from ACSM for exercise prescriptions.  Reviews F.I.T.T. principles of aerobic exercise including progression. Written material given at graduation.   Education: Resistance Exercise: - Group verbal and visual presentation on the components of exercise prescription. Introduces F.I.T.T principle from ACSM for exercise prescriptions  Reviews F.I.T.T. principles of resistance exercise including progression. Written material given at graduation. Flowsheet Row Cardiac Rehab from 10/01/2022 in [facility] Cardiac and Pulmonary Rehab  Date 10/01/22  Educator [facility]  Instruction Review Code 1- [employer] Understanding        Education: Exercise & Equipment Safety: - Individual verbal instruction and demonstration of equipment use and safety with use of the  equipment. Flowsheet Row Cardiac Rehab from 10/01/2022 in [facility] Cardiac and Pulmonary Rehab  Education need identified 06/25/22  Date 06/25/22  Educator [place]  Instruction Review Code 1- Verbalizes Understanding       Education: Exercise Physiology & General Exercise Guidelines: - Group verbal and written instruction with models to review the exercise physiology of the cardiovascular system and associated critical values. Provides general exercise guidelines with specific guidelines to those with heart or lung disease.  Flowsheet Row Cardiac Rehab from 10/01/2022 in [facility] Cardiac and Pulmonary Rehab  Education need identified 06/25/22  Date 08/20/22  Educator [facility]  Instruction Review Code 1- [employer] Understanding       Education: Flexibility, Balance, Mind/Body Relaxation: - Group  verbal and visual presentation with interactive activity on the components of exercise prescription. Introduces F.I.T.T principle from ACSM for exercise prescriptions. Reviews F.I.T.T. principles of flexibility and balance exercise training including progression. Also discusses the mind body connection.  Reviews various relaxation techniques to help reduce and manage stress (i.e. Deep breathing, progressive muscle relaxation, and visualization). Balance handout provided to take home. Written material given at graduation. Flowsheet Row Cardiac Rehab from 10/01/2022 in [facility] Cardiac and Pulmonary Rehab  Date 09/10/22  Educator [facility]  Instruction Review Code 1- Verbalizes Understanding       Activity Barriers & Risk Stratification:  Activity Barriers & Cardiac Risk Stratification - 06/25/22 1612       Activity Barriers & Cardiac Risk Stratification   Activity Barriers Balance Concerns;Back Problems;Arthritis;Assistive Device;Deconditioning   walker/cane as needed but has not used in a while   Cardiac Risk Stratification High             6 Minute Walk:  6 Minute Walk     Row Name 06/25/22 1613 09/24/22 1118        6 Minute Walk   Phase Initial Discharge    Distance 985 feet 1300 feet    Distance % Change -- 32 %    Distance Feet Change -- 315 ft    Walk Time 6 minutes 6 minutes    # of Rest Breaks 0 0    MPH 1.86 2.46    METS 1.67 2.29    RPE 12 12    Perceived Dyspnea  0 --    VO2 Peak 5.85 8.02    Symptoms No Yes (comment)    Comments -- hip pain 2/10    Resting HR 84 bpm 62 bpm    Resting BP 120/66 124/74    Resting Oxygen Saturation  99 % 95 %    Exercise Oxygen Saturation  during 6 min walk 97 % 92 %    Max Ex. HR 94 bpm 95 bpm    Max Ex. BP 130/62 148/74    2 Minute Post BP 122/68 --             Oxygen Initial Assessment:   Oxygen Re-Evaluation:   Oxygen Discharge (Final Oxygen Re-Evaluation):   Initial Exercise Prescription:  Initial Exercise Prescription - 06/25/22 1600       Date of Initial Exercise RX and Referring Provider   Date 06/25/22    Referring Provider Loralie Champagne MD      Recumbant Bike   Level 1    RPM 60    Watts 18    Minutes 15    METs 1.6      NuStep   Level 1    SPM 80    Minutes 15    METs 1.6      T5 Nustep   Level 1    SPM 80    Minutes 15    METs 1.6      Track   Laps 20    Minutes 15    METs 2.09      Prescription Details   Frequency (times per week) 2    Duration Progress to 30 minutes of continuous aerobic without signs/symptoms of physical distress      Intensity   THRR 40-80% of Max Heartrate 104 - 125    Ratings of Perceived Exertion 11-13    Perceived Dyspnea 0-4      Progression   Progression Continue to progress workloads to maintain intensity without  signs/symptoms of physical distress.      Resistance Training   Training Prescription Yes    Weight 3 lb    Reps 10-15             Perform Capillary Blood Glucose checks as needed.  Exercise Prescription Changes:   Exercise Prescription Changes     Row Name 06/25/22 1600 07/16/22 1500 07/28/22 1100 07/28/22 1400 08/13/22 0900      Response to Exercise   Blood Pressure (Admit) 120/66 120/62 -- 116/62 112/60   Blood Pressure (Exercise) 130/62 142/64 -- 128/62 142/72   Blood Pressure (Exit) 122/68 126/64 -- 110/64 124/64   Heart Rate (Admit) 84 bpm 68 bpm -- 69 bpm 74 bpm   Heart Rate (Exercise) 94 bpm 119 bpm -- 117 bpm 113 bpm   Heart Rate (Exit) 79 bpm 64 bpm -- 78 bpm 91 bpm   Oxygen Saturation (Admit) 99 % -- -- -- --   Oxygen Saturation (Exercise) 97 % -- -- -- --   Oxygen Saturation (Exit) 99 % -- -- -- --   Rating of Perceived Exertion (Exercise) 12 14 -- 15 15   Perceived Dyspnea (Exercise) 0 -- -- -- --   Symptoms none none -- none none   Comments walk test results First three rehab sessions -- -- --   Duration -- Continue with 30 min of aerobic exercise without signs/symptoms of physical distress. -- Continue with 30 min of aerobic exercise without signs/symptoms of physical distress. Continue with 30 min of aerobic exercise without signs/symptoms of physical distress.   Intensity -- THRR unchanged -- THRR unchanged THRR unchanged     Progression   Progression -- Continue to progress workloads to maintain intensity without signs/symptoms of physical distress. -- Continue to progress workloads to maintain intensity without signs/symptoms of physical distress. Continue to progress workloads to maintain intensity without signs/symptoms of physical distress.   Average METs -- 2.55 -- 2.63 2.71     Resistance Training   Training Prescription -- Yes -- Yes Yes   Weight -- 3 lb -- 3 lb 3 lb   Reps -- 10-15 -- 10-15 10-15     Interval Training   Interval Training -- No -- No No     Treadmill   MPH -- -- -- 2.5 2.5   Grade -- -- -- 1 1   Minutes -- -- -- 15 15   METs -- -- -- 3.26 3.26     Recumbant Bike   Level -- 1 -- 1.5 7   Watts -- 16 -- -- 25   Minutes -- 15 -- 15 15   METs -- 3.02 -- 1.7 --     NuStep   Level -- 4 -- 5 5   Minutes -- 15 -- 15 15   METs -- 2.8 -- 3.2 3.6     Recumbant  Elliptical   Level -- -- -- -- 1.5   Minutes -- -- -- -- 15   METs -- -- -- -- 1.6     T5 Nustep   Level -- 1 -- 3 4   Minutes -- 15 -- 15 15   METs -- 1.9 -- 2 2.1     Track   Laps -- 30 -- 30 --   Minutes -- 15 -- 15 --   METs -- 2.63 -- 2.63 --     Home Exercise Plan   Plans to continue exercise at -- -- [employer] (comment)  [name] plans to  walk at home, and go to the [facility] to use their aerobic machines. [facility] ([facility]  M[place] plans to walk at home, and go to [employer] to use their aerobic machines. [facility] (comment)  Marshall plans to walk at home, and go to [employer] to use their aerobic machines.   Frequency -- -- Add 3 additional days to program exercise sessions. Add 3 additional days to program exercise sessions. Add 3 additional days to program exercise sessions.   Initial Home Exercises Provided -- -- 07/28/22 07/28/22 07/28/22     Oxygen   Maintain Oxygen Saturation -- 88% or higher 88% or higher 88% or higher 88% or higher    Row Name 08/25/22 1500 09/10/22 1500 09/23/22 1700         Response to Exercise   Blood Pressure (Admit) 124/72 106/62 118/60     Blood Pressure (Exit) 120/70 120/66 98/58     Heart Rate (Admit) 71 bpm 72 bpm 65 bpm     Heart Rate (Exercise) 96 bpm 92 bpm 108 bpm     Heart Rate (Exit) 79 bpm 88 bpm 70 bpm     Rating of Perceived Exertion (Exercise) '14 13 13     '$ Symptoms none none none     Duration Continue with 30 min of aerobic exercise without signs/symptoms of physical distress. Continue with 30 min of aerobic exercise without signs/symptoms of physical distress. Continue with 30 min of aerobic exercise without signs/symptoms of physical distress.     Intensity THRR unchanged THRR unchanged THRR unchanged       Progression   Progression Continue to progress workloads to maintain intensity without signs/symptoms of physical distress. Continue to progress workloads to maintain intensity without  signs/symptoms of physical distress. Continue to progress workloads to maintain intensity without signs/symptoms of physical distress.     Average METs 2.64 2.87 2.45       Resistance Training   Training Prescription Yes Yes Yes     Weight 3 lb 3 lb 3 lb     Reps 10-15 10-15 10-15       Interval Training   Interval Training No No No       Treadmill   MPH 2.1 2.2 2.5     Grade 0.'5 2 2     '$ Minutes '15 15 15     '$ METs 2.75 3.29 3.6       Recumbant Bike   Level 7 -- --     Watts 25 -- --     Minutes 15 -- --     METs 3 -- --       NuStep   Level '5 5 5     '$ Minutes '15 15 15     '$ METs 3.6 3.5 2.8       Recumbant Elliptical   Level -- 1.3 2.7     Minutes -- 15 15     METs -- 1.6 2       T5 Nustep   Level 5 -- --     Minutes 15 -- --     METs 2.1 -- --       Home Exercise Plan   Plans to continue exercise at [employer] (comment)  [name] plans to walk at home, and go to the [facility] to use their aerobic machines. [facility] (comment)  [place] plans to walk at home, and go to [employer] to use their aerobic machines. [facility] (comment)  [name] plans to walk  at home, and go to the [facility] to use their aerobic machines.     Frequency Add 3 additional days to program exercise sessions. Add 3 additional days to program exercise sessions. Add 3 additional days to program exercise sessions.     Initial Home Exercises Provided 07/28/22 07/28/22 07/28/22       Oxygen   Maintain Oxygen Saturation 88% or higher 88% or higher 88% or higher              Exercise Comments:   Exercise Comments     Row Name 06/30/22 1056 10/13/22 1153         Exercise Comments First full day of exercise!  Patient was oriented to gym and equipment including functions, settings, policies, and procedures.  Patient's individual exercise prescription and treatment plan were reviewed.  All starting workloads were established based on the results of the 6 minute walk test done at initial  orientation visit.  The plan for exercise progression was also introduced and progression will be customized based on patient's performance and goals. Kendal graduated today from  rehab with 36 sessions completed.  Details of the patient's exercise prescription and what He needs to do in order to continue the prescription and progress were discussed with patient.  Patient was given a copy of prescription and goals.  Patient verbalized understanding.  Shilo plans to continue to exercise by walking at home.               Exercise Goals and Review:   Exercise Goals     Row Name 06/25/22 1621             Exercise Goals   Increase Physical Activity Yes       Intervention Provide advice, education, support and counseling about physical activity/exercise needs.;Develop an individualized exercise prescription for aerobic and resistive training based on initial evaluation findings, risk stratification, comorbidities and participant's personal goals.       Expected Outcomes Short Term: Attend rehab on a regular basis to increase amount of physical activity.;Long Term: Add in home exercise to make exercise part of routine and to increase amount of physical activity.;Long Term: Exercising regularly at least 3-5 days a week.       Increase Strength and Stamina Yes       Intervention Provide advice, education, support and counseling about physical activity/exercise needs.;Develop an individualized exercise prescription for aerobic and resistive training based on initial evaluation findings, risk stratification, comorbidities and participant's personal goals.       Expected Outcomes Short Term: Increase workloads from initial exercise prescription for resistance, speed, and METs.;Short Term: Perform resistance training exercises routinely during rehab and add in resistance training at home;Long Term: Improve cardiorespiratory fitness, muscular endurance and strength as measured by increased METs and  functional capacity (6MWT)       Able to understand and use rate of perceived exertion (RPE) scale Yes       Intervention Provide education and explanation on how to use RPE scale       Expected Outcomes Short Term: Able to use RPE daily in rehab to express subjective intensity level;Long Term:  Able to use RPE to guide intensity level when exercising independently       Able to understand and use Dyspnea scale Yes       Intervention Provide education and explanation on how to use Dyspnea scale       Expected Outcomes Short Term: Able to use Dyspnea scale daily  in rehab to express subjective sense of shortness of breath during exertion;Long Term: Able to use Dyspnea scale to guide intensity level when exercising independently       Knowledge and understanding of Target Heart Rate Range (THRR) Yes       Intervention Provide education and explanation of THRR including how the numbers were predicted and where they are located for reference       Expected Outcomes Short Term: Able to state/look up THRR;Long Term: Able to use THRR to govern intensity when exercising independently;Short Term: Able to use daily as guideline for intensity in rehab       Able to check pulse independently Yes       Intervention Provide education and demonstration on how to check pulse in carotid and radial arteries.;Review the importance of being able to check your own pulse for safety during independent exercise       Expected Outcomes Long Term: Able to check pulse independently and accurately;Short Term: Able to explain why pulse checking is important during independent exercise       Understanding of Exercise Prescription Yes       Intervention Provide education, explanation, and written materials on patient's individual exercise prescription       Expected Outcomes Short Term: Able to explain program exercise prescription;Long Term: Able to explain home exercise prescription to exercise independently                 Exercise Goals Re-Evaluation :  Exercise Goals Re-Evaluation     Row Name 06/30/22 1057 07/16/22 1518 07/28/22 1131 07/28/22 1500 08/13/22 1000     Exercise Goal Re-Evaluation   Exercise Goals Review Increase Physical Activity;Able to understand and use rate of perceived exertion (RPE) scale;Knowledge and understanding of Target Heart Rate Range (THRR);Understanding of Exercise Prescription;Increase Strength and Stamina;Able to check pulse independently Increase Physical Activity;Understanding of Exercise Prescription;Increase Strength and Stamina Increase Physical Activity;Understanding of Exercise Prescription;Increase Strength and Stamina;Able to check pulse independently;Knowledge and understanding of Target Heart Rate Range (THRR);Able to understand and use Dyspnea scale;Able to understand and use rate of perceived exertion (RPE) scale Increase Physical Activity;Increase Strength and Stamina;Understanding of Exercise Prescription Increase Physical Activity;Increase Strength and Stamina;Understanding of Exercise Prescription   Comments Reviewed RPE scale, THR and program prescription with pt today.  Pt voiced understanding and was given a copy of goals to take home. [name] is off to a good start in rehab. He had an overall average MET level of 2.55 METs during his first three sessions. He also was able to walk up to 30 laps on the track and worked up to level 4 on the T4. He did well with 3 lb hand weights for resistance training as well. We will continue to monitor his progress in the program. Reviewed home exercise with pt today.  Pt plans to walk at home, as well as go to the [facility] and use their aerobic machines for exercise.  Reviewed THR, pulse, RPE, sign and symptoms, pulse oximetery and when to call 911 or MD.  Also discussed weather considerations and indoor options.  Pt voiced understanding. [name] continues to do well in rehab. He did increase to level 5 on the T4 nustep and also was  able to use the treadmill for the first time which he did well, working over 3.2 METS! He is staying steady at 30 laps on the track and would beenfit from increasing beyond that. We will continue to monitor. [name] is doing well in  rehab. He increased his overall average MET level to 2.71 METs. He also tried increasing his speed on the treadmill to 2.6 mph with no incline, but he rated it as hard on the RPE scale. He was able to improve to level 7 on the recumbent bike and level 4 on the T5 as well. We will continue to monitor his progress in the program.   Expected Outcomes Short: Use RPE daily to regulate intensity.  Long: Follow program prescription in THR. Short: Continue to follow current exercise prescription. Long: Continue to increase strength and stamina. Short: Begin to walk at home on days away from rehab. Long: Continue to exercise independently. Short: Increase number of laps on track over 30 Long: Continue to increase overall MET level Short: Continue to increase workload on treadmill. Long: Continue to increase strength and stamina.    [place] Name 08/25/22 1518 09/10/22 1134 09/10/22 1509 09/23/22 1754       Exercise Goal Re-Evaluation   Exercise Goals Review Increase Physical Activity;Increase Strength and Stamina;Understanding of Exercise Prescription Increase Physical Activity;Increase Strength and Stamina;Understanding of Exercise Prescription Increase Physical Activity;Increase Strength and Stamina;Understanding of Exercise Prescription Increase Physical Activity;Increase Strength and Stamina;Understanding of Exercise Prescription    Comments [name] continues to do well in rehab.  His workload on the treadmill is a little inconsistent ans staff will remind patient to keep it consistent, however, he was able to work at a 2.1 mph with a 0.5% incline. He did increase to level 5 on the T5 Nustep. RPEs are staying appropriate. Patient would also benefit from increasing to 4 lb for  handweights. Will continue to monitor. [name] is doing well in rehab.  He is walking at home some to help with hip strength.  He is also building back his time at the [facility] as well.  He is feeling like his stamina is recovering. [name] continues to do well in rehab. He recently increased his overall average MET level to 2.87 METs. He also increased his workload on the treadmill to a speed of 2.2 mph and an incline of 2%. He has also continued to do well with 3 lb hand weights for resistance training, but may benefit from trying 4 lb. We will continue to monitor his progress in the program. [name] continues to do well in rehab. He has increased to a speed of 2.5 mph on the treadmill and also increased to level 2.7 on the REL. He is due for his post 6MWT and we hope to see significant improvement. We will continue to monitor.    Expected Outcomes Short: Increase to 4 lb handweights Long: Continue to increase overall MET level Short: Conitnue to walk and go to [facility] on off days Long: conitnue to improve stamina Short: Try using 4 lb hand weights. Long: Continue to improve strength and stamina. Short: Improve on post 6MWT Long: Continue to increase overall MET level and strength             Discharge Exercise Prescription (Final Exercise Prescription Changes):  Exercise Prescription Changes - 09/23/22 1700       Response to Exercise   Blood Pressure (Admit) 118/60    Blood Pressure (Exit) 98/58    Heart Rate (Admit) 65 bpm    Heart Rate (Exercise) 108 bpm    Heart Rate (Exit) 70 bpm    Rating of Perceived Exertion (Exercise) 13    Symptoms none    Duration Continue with 30 min of aerobic exercise without signs/symptoms of physical  distress.    Intensity THRR unchanged      Progression   Progression Continue to progress workloads to maintain intensity without signs/symptoms of physical distress.    Average METs 2.45      Resistance Training   Training Prescription Yes    Weight 3 lb     Reps 10-15      Interval Training   Interval Training No      Treadmill   MPH 2.5    Grade 2    Minutes 15    METs 3.6      NuStep   Level 5    Minutes 15    METs 2.8      Recumbant Elliptical   Level 2.7    Minutes 15    METs 2      Home Exercise Plan   Plans to continue exercise at [employer] (comment)   [name] plans to walk at home, and go to the [facility] to use their aerobic machines.   Frequency Add 3 additional days to program exercise sessions.    Initial Home Exercises Provided 07/28/22      Oxygen   Maintain Oxygen Saturation 88% or higher             Nutrition:  Target Goals: Understanding of nutrition guidelines, daily intake of sodium '1500mg'$ , cholesterol '200mg'$ , calories 30% from fat and 7% or less from saturated fats, daily to have 5 or more servings of fruits and vegetables.  Education: All About Nutrition: -Group instruction provided by verbal, written material, interactive activities, discussions, models, and posters to present general guidelines for heart healthy nutrition including fat, fiber, MyPlate, the role of sodium in heart healthy nutrition, utilization of the nutrition label, and utilization of this knowledge for meal planning. Follow up email sent as well. Written material given at graduation. Flowsheet Row Cardiac Rehab from 10/01/2022 in [facility] Cardiac and Pulmonary Rehab  Date 07/09/22  [Nutrition 2]  Educator [facility]  Instruction Review Code 1- Verbalizes Understanding       Biometrics:  Pre Biometrics - 06/25/22 1612       Pre Biometrics   Height 5' 8.5" (1.74 m)    Weight 169 lb 6.4 oz (76.8 kg)    Waist Circumference 41 inches    Hip Circumference 39.5 inches    Waist to Hip Ratio 1.04 %    BMI (Calculated) 25.38    Single Leg Stand 3.5 seconds             Post Biometrics - 09/24/22 1119        Post  Biometrics   Height 5' 8.5" (1.74 m)    Weight 176 lb 11.2 oz (80.2 kg)    Waist Circumference 42 inches    Hip  Circumference 40 inches    Waist to Hip Ratio 1.05 %    BMI (Calculated) 26.47    Single Leg Stand 3.7 seconds             Nutrition Therapy Plan and Nutrition Goals:  Nutrition Therapy & Goals - 07/16/22 1204       Nutrition Therapy   Diet Heart healthy, low Na, T2DM MNT    Drug/Food Interactions Statins/Certain Fruits    Protein (specify units) 80-90g    Fiber 21 grams    Whole Grain Foods 3 servings    Saturated Fats 12 max. grams    Fruits and Vegetables 8 servings/day    Sodium 1.5 grams      Personal  Nutrition Goals   Nutrition Goal ST: eat at least 1 protien source at each meal and snack, have a consistent meal/snack at dinner time LT: meet energy and protein needs, continue with heart healthy diet    Comments 87 y.o. M admitted to cardiac rehab s/p chronic systolic HF. PMHx includes T2DM, CAD, HTN, ,malignant neoplasm of prostate (2016), HLD. Relevant medications includes lipitor, jardiance, lasix, tradjenta, ativan, zofran, trazodone. PYP Score: 66. Vegetables & Fruits 9/12. Breads, Grains & Cereals 10/12. Red & Processed Meat 10/12. Poultry 2/2. Fish & Shellfish 1/4. Beans, Nuts & Seeds 2/4. Milk & Dairy Foods 4/6. Toppings, Oils, Seasonings & Salt 13/20. Sweets, Snacks & Restaurant Food 6/14. Beverages 9/10. B: medication with glucerna (1/2 now, 1/2 afternoon) with whole wheat toast.  L: cabbage and green beans with sometimes rice D: sometimes will not eat dinner, sometimes will go out, he eats red meat 1x/week. He also gets canned salmon to have during the week such as salmon and grits. He does not use added salt and he likes adding vinegar. He loves Tadros beans - suggested to include these with his vegetables at lunch to boost the protein. Discussed heart healthy eating and T2DM MNT.      Intervention Plan   Intervention Prescribe, educate and counsel regarding individualized specific dietary modifications aiming towards targeted core components such as weight,  hypertension, lipid management, diabetes, heart failure and other comorbidities.    Expected Outcomes Short Term Goal: Understand basic principles of dietary content, such as calories, fat, sodium, cholesterol and nutrients.;Short Term Goal: A plan has been developed with personal nutrition goals set during dietitian appointment.;Long Term Goal: Adherence to prescribed nutrition plan.             Nutrition Assessments:  MEDIFICTS Score Key: ?70 Need to make dietary changes  40-70 Heart Healthy Diet ? 40 Therapeutic Level Cholesterol Diet  Flowsheet Row Cardiac Rehab from 09/29/2022 in [facility] Cardiac and Pulmonary Rehab  Picture Your Plate Total Score on Admission 66  Picture Your Plate Total Score on Discharge 76      Picture Your Plate Scores: [id number] Unhealthy dietary pattern with much room for improvement. 41-50 Dietary pattern unlikely to meet recommendations for good health and room for improvement. 51-60 More healthful dietary pattern, with some room for improvement.  >60 Healthy dietary pattern, although there may be some specific behaviors that could be improved.    Nutrition Goals Re-Evaluation:  Nutrition Goals Re-Evaluation     [place] Name 08/20/22 1143 09/10/22 1137           Goals   Nutrition Goal -- Short: choose heart healthy snacks in between meals. Long: independently manage heart healthy diet.      Comment [name] has added more protein to his snacks. His favorite snack is cheese crackers, and he is monitoring his intake of sodium. He reports he has been trying to eat more consistent times [name] is doing well in rehab.  He is starting to get his appetitie back, but taste still aren't back to where he was before.  He is hoping that will still come back.  He continues to add in more protein daily into his snacks.  He is still watching his sodium intake.      Expected Outcome Short: choose heart healthy snacks in between meals. Long: independently manage heart healthy  diet. Short: Continue to monitor sodium intake Long: Continue to add in more protein.  Nutrition Goals Discharge (Final Nutrition Goals Re-Evaluation):  Nutrition Goals Re-Evaluation - 09/10/22 1137       Goals   Nutrition Goal Short: choose heart healthy snacks in between meals. Long: independently manage heart healthy diet.    Comment [name] is doing well in rehab.  He is starting to get his appetitie back, but taste still aren't back to where he was before.  He is hoping that will still come back.  He continues to add in more protein daily into his snacks.  He is still watching his sodium intake.    Expected Outcome Short: Continue to monitor sodium intake Long: Continue to add in more protein.             Psychosocial: Target Goals: Acknowledge presence or absence of significant depression and/or stress, maximize coping skills, provide positive support system. Participant is able to verbalize types and ability to use techniques and skills needed for reducing stress and depression.   Education: Stress, Anxiety, and Depression - Group verbal and visual presentation to define topics covered.  Reviews how body is impacted by stress, anxiety, and depression.  Also discusses healthy ways to reduce stress and to treat/manage anxiety and depression.  Written material given at graduation. Flowsheet Row Cardiac Rehab from 10/01/2022 in [facility] Cardiac and Pulmonary Rehab  Date 08/13/22  Educator [facility]  Instruction Review Code 1- [employer] Understanding       Education: Sleep Hygiene -Provides group verbal and written instruction about how sleep can affect your health.  Define sleep hygiene, discuss sleep cycles and impact of sleep habits. Review good sleep hygiene tips.    Initial Review & Psychosocial Screening:  Initial Psych Review & Screening - 06/17/22 [place]? Yes   Joelene Millin his care giver, no immediate family      Barriers   Psychosocial barriers to participate in program There are no identifiable barriers or psychosocial needs.      Screening Interventions   Interventions Encouraged to exercise;To provide support and resources with identified psychosocial needs;Provide feedback about the scores to participant    Expected Outcomes Short Term goal: Utilizing psychosocial counselor, staff and physician to assist with identification of specific Stressors or current issues interfering with healing process. Setting desired goal for each stressor or current issue identified.;Long Term Goal: Stressors or current issues are controlled or eliminated.;Short Term goal: Identification and review with participant of any Quality of Life or Depression concerns found by scoring the questionnaire.;Long Term goal: The participant improves quality of Life and PHQ9 Scores as seen by post scores and/or verbalization of changes             Quality of Life Scores:   Quality of Life - 09/29/22 1138       Quality of Life   Select Quality of Life      Quality of Life Scores   Health/Function Pre 6.86 %    Health/Function Post 19.83 %    Health/Function % Change 189.07 %    Socioeconomic Pre 30 %    Socioeconomic Post 20.33 %    Socioeconomic % Change  -32.23 %    Psych/Spiritual Pre 17 %    Psych/Spiritual Post 20.57 %    Psych/Spiritual % Change 21 %    Family Pre 14 %    Family Post 16.25 %    Family % Change 16.07 %    GLOBAL Pre 13.93 %  GLOBAL Post 19.64 %    GLOBAL % Change 40.99 %            Scores of 19 and below usually indicate a poorer quality of life in these areas.  A difference of  2-3 points is a clinically meaningful difference.  A difference of 2-3 points in the total score of the Quality of Life Index has been associated with significant improvement in overall quality of life, self-image, physical symptoms, and general health in studies assessing change in quality of life.  PHQ-9: Review  Flowsheet  More data exists      09/29/2022 07/21/2022 06/25/2022 02/27/2022 01/20/2022  Depression screen PHQ 2/9  Decreased Interest '1 1 1 '$ 0 0  Down, Depressed, Hopeless 1 0 1 0 0  PHQ - 2 Score '2 1 2 '$ 0 0  Altered sleeping 1 0 3 - -  Tired, decreased energy '1 1 3 '$ - -  Change in appetite 1 0 0 - -  Feeling bad or failure about yourself  1 0 1 - -  Trouble concentrating 0 0 0 - -  Moving slowly or fidgety/restless 0 0 0 - -  Suicidal thoughts 0 0 1 - -  PHQ-9 Score '6 2 10 '$ - -  Difficult doing work/chores Somewhat difficult Somewhat difficult Very difficult - -   Interpretation of Total Score  Total Score Depression Severity:  1-4 = Minimal depression, 5-9 = Mild depression, 10-14 = Moderate depression, 15-19 = Moderately severe depression, 20-27 = Severe depression   Psychosocial Evaluation and Intervention:  Psychosocial Evaluation - 06/17/22 1602       Psychosocial Evaluation & Interventions   Interventions Encouraged to exercise with the program and follow exercise prescription    Comments Anav has no barriers to attending the program. He is ready to start exercising as he has not done much in the past year. He lives alone. He has support from his caregiver of 8 years,Kimberly, and distant relatives. He is attending for a diagnosis of Heart failure and he does weigh himself every day and watches for daily weight gain over 2 pounds. He knows to call his doctor if that gain occurs. He should do well with the program.    Expected Outcomes STG Zamar attends all scheduled sessions, he returns to a routine exercise program. LTG Lynann Bologna after discharge will continue with his exercise progression.    Continue Psychosocial Services  Follow up required by staff             Psychosocial Re-Evaluation:  Psychosocial Re-Evaluation     [place] Name 07/23/22 1114 08/20/22 1139 09/10/22 1135         Psychosocial Re-Evaluation   Current issues with None Identified None Identified None  Identified     Comments [name] reports enjoying the program. He feels stronger, but still wants to increase his stamina. He still is having chronic sleep issues, he doesn't like his cpap and doesn't feel like he sleeps well nightly. His MD is aware. 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 He used to golf for fun but doesn't feel up for it so now he enjoys watching movies for fun. [name] is still enjoing the program. He has noticed after the increase in his spirinolactone and attending the program more, he feels stronger and more stable. He feels his fluid balance is well controlled as well as his blood pressure. He wants to get outside more and feels more down when the weather is bad. He enjoys  watching movies during the dreary weather, but is ready for spring so he can get outiside more. He has completed his home exercise discussion with the EP and feels like he will be more ready to get started when warmer weather comes. [name] is doing well in rehab.  He does not have any major stressors other than his health.  His biggest concern currently is his hip. He wants to keep moving and building strength so he doesn't hurt.  He is sleeping welll and getting out to walk on nice weather days.     Expected Outcomes Short: continue to attend cardiac rehab for education and exercise. Long: maintain positive self care habits. Short: try to get outside when the weather is pretty. Long: line up self care habits for bad weather days. Short: Continue to walk for mental boost Long: Continue to stay positive     Interventions -- -- Encouraged to attend Cardiac Rehabilitation for the exercise     Continue Psychosocial Services  Follow up required by staff Follow up required by staff Follow up required by staff              Psychosocial Discharge (Final Psychosocial Re-Evaluation):  Psychosocial Re-Evaluation - 09/10/22 1135       Psychosocial Re-Evaluation   Current issues with None Identified     Comments [name] is doing well in rehab.  He does not have any major stressors other than his health.  His biggest concern currently is his hip. He wants to keep moving and building strength so he doesn't hurt.  He is sleeping welll and getting out to walk on nice weather days.    Expected Outcomes Short: Continue to walk for mental boost Long: Continue to stay positive    Interventions Encouraged to attend Cardiac Rehabilitation for the exercise    Continue Psychosocial Services  Follow up required by staff             Vocational Rehabilitation: Provide vocational rehab assistance to qualifying candidates.   Vocational Rehab Evaluation & Intervention:   Education: Education Goals: Education classes will be provided on a variety of topics geared toward better understanding of heart health and risk factor modification. Participant will state understanding/return demonstration of topics presented as noted by education test scores.  Learning Barriers/Preferences:   General Cardiac Education Topics:  AED/CPR: - Group verbal and written instruction with the use of models to demonstrate the basic use of the AED with the basic ABC's of resuscitation.   Anatomy and Cardiac Procedures: - Group verbal and visual presentation and models provide information about basic cardiac anatomy and function. Reviews the testing methods done to diagnose heart disease and the outcomes of the test results. Describes the treatment choices: Medical Management, Angioplasty, or Coronary Bypass Surgery for treating various heart conditions including Myocardial Infarction, Angina, Valve Disease, and Cardiac Arrhythmias.  Written material given at graduation. Flowsheet Row Cardiac Rehab from 10/01/2022 in [facility] Cardiac and Pulmonary Rehab  Education need identified 06/25/22  Date 09/17/22  Educator SB  Instruction Review Code 1- Verbalizes Understanding       Medication Safety: - Group verbal and visual  instruction to review commonly prescribed medications for heart and lung disease. Reviews the medication, class of the drug, and side effects. Includes the steps to properly store meds and maintain the prescription regimen.  Written material given at graduation. Flowsheet Row Cardiac Rehab from 10/01/2022 in [facility] Cardiac and Pulmonary Rehab  Date 07/23/22  Educator SB  Instruction Review  Code 1- Verbalizes Understanding       Intimacy: - Group verbal instruction through game format to discuss how heart and lung disease can affect sexual intimacy. Written material given at graduation..   Know Your Numbers and Heart Failure: - Group verbal and visual instruction to discuss disease risk factors for cardiac and pulmonary disease and treatment options.  Reviews associated critical values for Overweight/Obesity, Hypertension, Cholesterol, and Diabetes.  Discusses basics of heart failure: signs/symptoms and treatments.  Introduces Heart Failure Zone chart for action plan for heart failure.  Written material given at graduation. Flowsheet Row Cardiac Rehab from 10/01/2022 in [facility] Cardiac and Pulmonary Rehab  Date 07/30/22  Educator SB  Instruction Review Code 1- Verbalizes Understanding       Infection Prevention: - Provides verbal and written material to individual with discussion of infection control including proper hand washing and proper equipment cleaning during exercise session. Flowsheet Row Cardiac Rehab from 10/01/2022 in [facility] Cardiac and Pulmonary Rehab  Education need identified 06/25/22  Date 06/25/22  Educator [place]  Instruction Review Code 1- Verbalizes Understanding       Falls Prevention: - Provides verbal and written material to individual with discussion of falls prevention and safety. Flowsheet Row Cardiac Rehab from 06/17/2022 in [facility] Cardiac and Pulmonary Rehab  Date 06/17/22  Educator SB  Instruction Review Code 1- Verbalizes Understanding       Other: -Provides  group and verbal instruction on various topics (see comments) Flowsheet Row Cardiac Rehab from 10/01/2022 in [facility] Cardiac and Pulmonary Rehab  Date 09/24/22  Educator SB  Instruction Review Code 1- Verbalizes Understanding       Knowledge Questionnaire Score:  Knowledge Questionnaire Score - 09/29/22 1138       Knowledge Questionnaire Score   Pre Score 24/26    Post Score 24/26             Core Components/Risk Factors/Patient Goals at Admission:  Personal Goals and Risk Factors at Admission - 06/25/22 1621       Core Components/Risk Factors/Patient Goals on Admission    Weight Management Yes;Weight Maintenance    Intervention Weight Management: Develop a combined nutrition and exercise program designed to reach desired caloric intake, while maintaining appropriate intake of nutrient and fiber, sodium and fats, and appropriate energy expenditure required for the weight goal.;Weight Management: Provide education and appropriate resources to help participant work on and attain dietary goals.    Admit Weight 169 lb (76.7 kg)    Goal Weight: Short Term 169 lb (76.7 kg)    Goal Weight: Long Term 169 lb (76.7 kg)    Expected Outcomes Short Term: Continue to assess and modify interventions until short term weight is achieved;Long Term: Adherence to nutrition and physical activity/exercise program aimed toward attainment of established weight goal;Weight Maintenance: Understanding of the daily nutrition guidelines, which includes 25-35% calories from fat, 7% or less cal from saturated fats, less than '200mg'$  cholesterol, less than 1.5gm of sodium, & 5 or more servings of fruits and vegetables daily;Understanding recommendations for meals to include 15-35% energy as protein, 25-35% energy from fat, 35-60% energy from carbohydrates, less than '200mg'$  of dietary cholesterol, 20-35 gm of total fiber daily;Understanding of distribution of calorie intake throughout the day with the consumption of 4-5  meals/snacks    Diabetes Yes    Intervention Provide education about signs/symptoms and action to take for hypo/hyperglycemia.;Provide education about proper nutrition, including hydration, and aerobic/resistive exercise prescription along with prescribed medications to achieve blood glucose  in normal ranges: Fasting glucose 65-99 mg/dL    Expected Outcomes Short Term: Participant verbalizes understanding of the signs/symptoms and immediate care of hyper/hypoglycemia, proper foot care and importance of medication, aerobic/resistive exercise and nutrition plan for blood glucose control.;Long Term: Attainment of HbA1C < 7%.    Heart Failure Yes    Intervention Provide a combined exercise and nutrition program that is supplemented with education, support and counseling about heart failure. Directed toward relieving symptoms such as shortness of breath, decreased exercise tolerance, and extremity edema.    Expected Outcomes Improve functional capacity of life;Short term: Attendance in program 2-3 days a week with increased exercise capacity. Reported lower sodium intake. Reported increased fruit and vegetable intake. Reports medication compliance.;Short term: Daily weights obtained and reported for increase. Utilizing diuretic protocols set by physician.;Long term: Adoption of self-care skills and reduction of barriers for early signs and symptoms recognition and intervention leading to self-care maintenance.    Hypertension Yes    Intervention Provide education on lifestyle modifcations including regular physical activity/exercise, weight management, moderate sodium restriction and increased consumption of fresh fruit, vegetables, and low fat dairy, alcohol moderation, and smoking cessation.;Monitor prescription use compliance.    Expected Outcomes Short Term: Continued assessment and intervention until BP is < 140/70m HG in hypertensive participants. < 130/824mHG in hypertensive participants with diabetes,  heart failure or chronic kidney disease.;Long Term: Maintenance of blood pressure at goal levels.    Lipids Yes    Intervention Provide education and support for participant on nutrition & aerobic/resistive exercise along with prescribed medications to achieve LDL '70mg'$ , HDL >'40mg'$ .    Expected Outcomes Short Term: Participant states understanding of desired cholesterol values and is compliant with medications prescribed. Participant is following exercise prescription and nutrition guidelines.;Long Term: Cholesterol controlled with medications as prescribed, with individualized exercise RX and with personalized nutrition plan. Value goals: LDL < '70mg'$ , HDL > 40 mg.             Education:Diabetes - Individual verbal and written instruction to review signs/symptoms of diabetes, desired ranges of glucose level fasting, after meals and with exercise. Acknowledge that pre and post exercise glucose checks will be done for 3 sessions at entry of program. [facility]  Education need identified 06/25/22  Date 06/25/22  Educator KL[place]rier[place]ruction Review Code 1- Verbalizes Understanding       Core Components/Risk Factors/Patient Goals Review:   Goals and Risk Factor Review     Row Name 07/23/22 1107 08/20/22 1131 09/10/22 1140         Core Components/Risk Factors/Patient Goals Review   Personal Goals Review Diabetes;Heart Failure;Hypertension;Lipids Diabetes;Heart Failure;Hypertension;Lipids Diabetes;Heart Failure;Hypertension;Lipids;Weight Management/Obesity     Review Saw heart failure team yesterday and they increased his spirinolactone some and did a repeat echo. He thinks his little weight gain is not fluid, but that he has been eating more lately. He doesn't have any other signs of fluid over load right now. His diabetes is being well managed. He doesn't have to check it every day, but knows the symptoms of low/high blood sugar.  His blood pressure has been stable in the program so far. MaRuthann Cancereports doing well with maintaining his fluid balance. His medication is going well. His most recent echo showed his EF improved from 25-30% to 40-45%. He is pleased with this. He hasn't checked his sugar lately but feels like it is going well and knows what symptoms to look  out for. His main goal is to increase his stamina. He states he likes to exercise outside and it has been hard with the dreary weather. He is motivated to keep coming and finish the program [name] is doing well in rehab.  He had a nurse follow up last week to review health and medications.  He denies any swelling or shortness of breath.  His weight is staying steady too.  He has not had any swings with his sugars that he is aware of.  He is planning to ask for an A1c at his next PCP check.  His pressures are doing well as well.     Expected Outcomes Short: adhere to medication change and daily weights. Long: independenlty manage risk factors. Short: attend cardiac rehab for education and exercise. Long: independnelty manage healthy lifestyle Short: Ask about A1c check Long:Continue to monitor risk factors              Core Components/Risk Factors/Patient Goals at Discharge (Final Review):   Goals and Risk Factor Review - 09/10/22 1140       Core Components/Risk Factors/Patient Goals Review   Personal Goals Review Diabetes;Heart Failure;Hypertension;Lipids;Weight Management/Obesity    Review [name] is doing well in rehab.  He had a nurse follow up last week to review health and medications.  He denies any swelling or shortness of breath.  His weight is staying steady too.  He has not had any swings with his sugars that he is aware of.  He is planning to ask for an A1c at his next PCP check.  His pressures are doing well as well.    Expected Outcomes Short: Ask about A1c check Long:Continue to monitor risk factors             ITP Comments:  ITP Comments      Row Name 06/17/22 1608 06/25/22 1602 06/30/22 1055 07/08/22 1102 07/16/22 1257   ITP Comments Virtual orientation call completed today. he has an appointment on Date: 06/25/2022  for EP eval and gym Orientation.  Documentation of diagnosis [facility] in [facility] Date: 05/28/2022 . Completed 6MWT and gym orientation. Initial ITP created and sent for review to Dr. Emily Filbert, Medical Director. First full day of exercise!  Patient was oriented to gym and equipment including functions, settings, policies, and procedures.  Patient's individual exercise prescription and treatment plan were reviewed.  All starting workloads were established based on the results of the 6 minute walk test done at initial orientation visit.  The plan for exercise progression was also introduced and progression will be customized based on patient's performance and goals. 30 Day review completed. Medical Director ITP review done, changes made as directed, and signed approval by Medical Director.   new to program Completed initial RD consultation    [place] Name 08/05/22 1128 09/02/22 1102 09/30/22 1527 10/13/22 1153     ITP Comments 30 Day review completed. Medical Director ITP review done, changes made as directed, and signed approval by Medical Director. 30 Day review completed. Medical Director ITP review done, changes made as directed, and signed approval by Medical Director. 30 day review completed. ITP sent to Dr. Emily Filbert, Medical Director of Cardiac Rehab. Continue with ITP unless changes are made by physician. Kindell graduated today from  rehab with 36 sessions completed.  Details of the patient's exercise prescription and what He needs to do in order to continue the prescription and progress were discussed with patient.  Patient was given a copy  of prescription and goals.  Patient verbalized understanding.  Dimitrious plans to continue to exercise by walking at home.             Comments: Discharge ITP

## 2022-11-03 ENCOUNTER — Encounter (HOSPITAL_COMMUNITY): Payer: Medicare Other

## 2022-11-05 ENCOUNTER — Ambulatory Visit (INDEPENDENT_AMBULATORY_CARE_PROVIDER_SITE_OTHER): Payer: Medicare Other

## 2022-11-05 VITALS — BP 128/74 | Ht 69.0 in | Wt 174.0 lb

## 2022-11-05 DIAGNOSIS — Z0000 Encounter for general adult medical examination without abnormal findings: Secondary | ICD-10-CM | POA: Diagnosis not present

## 2022-11-05 NOTE — Patient Instructions (Addendum)
Mr. Antonio Clark , Thank you for taking time to come for your Medicare Wellness Visit. I appreciate your ongoing commitment to your health goals. Please review the following plan we discussed and let me know if I can assist you in the future.   These are the goals we discussed:  Goals      Exercise 3x per week (30 min per time)     Encouraged pt to try and increase exercise as tolerated.      Pharmacy Care Plan:     CARE PLAN ENTRY (see longitudinal plan of care for additional care plan information)  Current Barriers:  Chronic Disease Management support, education, and care coordination needs related to Hypertension, Hyperlipidemia, Diabetes, and Atrial Fibrillation   Hypertension BP Readings from Last 3 Encounters:  05/10/20 124/61  05/06/20 (!) 128/58  04/17/20 (!) 125/55  Pharmacist Clinical Goal(s): Over the next 90 days, patient will work with PharmD and providers to maintain BP goal <130/80 Current regimen:  HCTZ 25mg  daily Metoprolol tartrate 25 mg bid Valsartan 320mg  daily Interventions: Reviewed home BP readings Discussed medication adherence Patient self care activities - Over the next 90 days, patient will: Check BP daily, document, and provide at future appointments Ensure daily salt intake < 2300 mg/day  Hyperlipidemia Lab Results  Component Value Date/Time   [facility] 08/14/2019 08:56 AM  Pharmacist Clinical Goal(s): Over the next 90 days, patient will work with PharmD and providers to achieve LDL goal < 70 Current regimen:  Atorvastatin 40mg  daily Interventions: Reviewed most recent lipid panel Discussed dietary modifications Patient self care activities - Over the next 90 days, patient will: Continue to take medication as directed Report to PCP for updated labs  Diabetes Lab Results  Component Value Date/Time   HGBA1C 7.0 (H) 08/14/2019 08:56 AM   HGBA1C 6.6 (H) 02/09/2019 08:16 AM  Pharmacist Clinical Goal(s): Over the next 90 days, patient will work  with PharmD and providers to achieve A1c goal <7% Current regimen:  Atorvastatin 40mg  Interventions: Reviewed most recent A1c Discussed current dietary modifications Patient self care activities - Over the next 90 days, patient will: Check blood sugar at physical in office, document, and provide at future appointments Work on dietary modifications limiting carbohydrates and snack foods.  Afib Pharmacist Clinical Goal(s) Over the next 90 days, patient will work with PharmD and providers to optimize medication and minimize symptoms of Afib. Current regimen:  Metoprolol tartrate 25mg  bid Pradaxa 150mg  bid Interventions: Discussed addition of metoprolol and what to expect Counseled on abnormal bruising and bleeding Patient self care activities - Over the next 90 days, patient will: Continue to take medication as directed Contact providers with and change in symptoms   Initial goal documentation      Track and Manage Fluids and Swelling-Heart Failure     Timeframe:  Long-Range Goal Priority:  High Start Date:   02/27/22                          Expected End Date:   08/29/21                    Follow Up Date 05/30/22/    - call office if I gain more than 2 pounds in one day or 5 pounds in one week - keep legs up while sitting - use salt in moderation    Why is this important?   It is important to check your weight daily and watch  how much salt and liquids you have.  It will help you to manage your heart failure.    Notes:         This is a list of the screening recommended for you and due dates:  Health Maintenance  Topic Date Due   Eye exam for diabetics  Never done   Yearly kidney health urinalysis for diabetes  Never done   Complete foot exam   08/16/2020   DTaP/Tdap/Td vaccine (2 - Td or Tdap) 06/10/2021   Flu Shot  03/10/2022   COVID-19 Vaccine (4 - 2023-24 season) 04/10/2022   Hemoglobin A1C  08/12/2022   Medicare Annual Wellness Visit  10/31/2022   Yearly  kidney function blood test for diabetes  08/20/2023   Pneumonia Vaccine  Completed   Zoster (Shingles) Vaccine  Completed   HPV Vaccine  Aged Out    Advanced directives: Please bring a copy of your health care power of attorney and living will to the office to be added to your chart at your convenience.  Conditions/risks identified: Aim for 30 minutes of exercise or brisk walking, 6-8 glasses of water, and 5 servings of fruits and vegetables each day.   Next appointment: Follow up in one year for your annual wellness visit.   Preventive Care 47 Years and Older, Male  Preventive care refers to lifestyle choices and visits with your health care provider that can promote health and wellness. What does preventive care include? A yearly physical exam. This is also called an annual well check. Dental exams once or twice a year. Routine eye exams. Ask your health care provider how often you should have your eyes checked. Personal lifestyle choices, including: Daily care of your teeth and gums. Regular physical activity. Eating a healthy diet. Avoiding tobacco and drug use. Limiting alcohol use. Practicing safe sex. Taking low doses of aspirin every day. Taking vitamin and mineral supplements as recommended by your health care provider. What happens during an annual well check? The services and screenings done by your health care provider during your annual well check will depend on your age, overall health, lifestyle risk factors, and family history of disease. Counseling  Your health care provider may ask you questions about your: Alcohol use. Tobacco use. Drug use. Emotional well-being. Home and relationship well-being. Sexual activity. Eating habits. History of falls. Memory and ability to understand (cognition). Work and work Statistician. Screening  You may have the following tests or measurements: Height, weight, and BMI. Blood pressure. Lipid and cholesterol levels. These  may be checked every 5 years, or more frequently if you are over 12 years old. Skin check. Lung cancer screening. You may have this screening every year starting at age 86 if you have a 30-pack-year history of smoking and currently smoke or have quit within the past 15 years. Fecal occult blood test (FOBT) of the stool. You may have this test every year starting at age 22. Flexible sigmoidoscopy or colonoscopy. You may have a sigmoidoscopy every 5 years or a colonoscopy every 10 years starting at age 57. Prostate cancer screening. Recommendations will vary depending on your family history and other risks. Hepatitis C blood test. Hepatitis B blood test. Sexually transmitted disease (STD) testing. Diabetes screening. This is done by checking your blood sugar (glucose) after you have not eaten for a while (fasting). You may have this done every 1-3 years. Abdominal aortic aneurysm (AAA) screening. You may need this if you are a current or former smoker. Osteoporosis.  You may be screened starting at age 84 if you are at high risk. Talk with your health care provider about your test results, treatment options, and if necessary, the need for more tests. Vaccines  Your health care provider may recommend certain vaccines, such as: Influenza vaccine. This is recommended every year. Tetanus, diphtheria, and acellular pertussis (Tdap, Td) vaccine. You may need a Td booster every 10 years. Zoster vaccine. You may need this after age 3. Pneumococcal 13-valent conjugate (PCV13) vaccine. One dose is recommended after age 79. Pneumococcal polysaccharide (PPSV23) vaccine. One dose is recommended after age 41. Talk to your health care provider about which screenings and vaccines you need and how often you need them. This information is not intended to replace advice given to you by your health care provider. Make sure you discuss any questions you have with your health care provider. Document Released:  08/23/2015 Document Revised: 04/15/2016 Document Reviewed: 05/28/2015 Elsevier Interactive Patient Education  2017 [place] Prevention in the Home Falls can cause injuries. They can happen to people of all ages. There are many things you can do to make your home safe and to help prevent falls. What can I do on the outside of my home? Regularly fix the edges of walkways and driveways and fix any cracks. Remove anything that might make you trip as you walk through a door, such as a raised step or threshold. Trim any bushes or trees on the path to your home. Use bright outdoor lighting. Clear any walking paths of anything that might make someone trip, such as rocks or tools. Regularly check to see if handrails are loose or broken. Make sure that both sides of any steps have handrails. Any raised decks and porches should have guardrails on the edges. Have any leaves, snow, or ice cleared regularly. Use sand or salt on walking paths during winter. Clean up any spills in your garage right away. This includes oil or grease spills. What can I do in the bathroom? Use night lights. Install grab bars by the toilet and in the tub and shower. Do not use towel bars as grab bars. Use non-skid mats or decals in the tub or shower. If you need to sit down in the shower, use a plastic, non-slip stool. Keep the floor dry. Clean up any water that spills on the floor as soon as it happens. Remove soap buildup in the tub or shower regularly. Attach bath mats securely with double-sided non-slip rug tape. Do not have throw rugs and other things on the floor that can make you trip. What can I do in the bedroom? Use night lights. Make sure that you have a light by your bed that is easy to reach. Do not use any sheets or blankets that are too big for your bed. They should not hang down onto the floor. Have a firm chair that has side arms. You can use this for support while you get dressed. Do not have  throw rugs and other things on the floor that can make you trip. What can I do in the kitchen? Clean up any spills right away. Avoid walking on wet floors. Keep items that you use a lot in easy-to-reach places. If you need to reach something above you, use a strong step stool that has a grab bar. Keep electrical cords out of the way. Do not use floor polish or wax that makes floors slippery. If you must use wax, use non-skid floor wax.  Do not have throw rugs and other things on the floor that can make you trip. What can I do with my stairs? Do not leave any items on the stairs. Make sure that there are handrails on both sides of the stairs and use them. Fix handrails that are broken or loose. Make sure that handrails are as long as the stairways. Check any carpeting to make sure that it is firmly attached to the stairs. Fix any carpet that is loose or worn. Avoid having throw rugs at the top or bottom of the stairs. If you do have throw rugs, attach them to the floor with carpet tape. Make sure that you have a light switch at the top of the stairs and the bottom of the stairs. If you do not have them, ask someone to add them for you. What else can I do to help prevent falls? Wear shoes that: Do not have high heels. Have rubber bottoms. Are comfortable and fit you well. Are closed at the toe. Do not wear sandals. If you use a stepladder: Make sure that it is fully opened. Do not climb a closed stepladder. Make sure that both sides of the stepladder are locked into place. Ask someone to hold it for you, if possible. Clearly mark and make sure that you can see: Any grab bars or handrails. First and last steps. Where the edge of each step is. Use tools that help you move around (mobility aids) if they are needed. These include: Canes. Walkers. Scooters. Crutches. Turn on the lights when you go into a dark area. Replace any light bulbs as soon as they burn out. Set up your furniture so  you have a clear path. Avoid moving your furniture around. If any of your floors are uneven, fix them. If there are any pets around you, be aware of where they are. Review your medicines with your doctor. Some medicines can make you feel dizzy. This can increase your chance of falling. Ask your doctor what other things that you can do to help prevent falls. This information is not intended to replace advice given to you by your health care provider. Make sure you discuss any questions you have with your health care provider. Document Released: 05/23/2009 Document Revised: 01/02/2016 Document Reviewed: 08/31/2014 Elsevier Interactive Patient Education  2017 [employer].

## 2022-11-05 NOTE — Progress Notes (Signed)
Subjective:   Antonio Clark is a 87 y.o. male who presents for Medicare Annual/Subsequent preventive examination.  Review of Systems     Cardiac Risk Factors include: advanced age (>73men, >44 women);dyslipidemia;diabetes mellitus;male gender     Objective:    Today's Vitals   11/05/22 1112  BP: 128/74  Weight: 174 lb (78.9 kg)  Height: 5\' 9"  (1.753 m)   Body mass index is 25.7 kg/m.     11/05/2022   12:55 PM 06/17/2022    3:47 PM 06/11/2022    7:17 PM 04/18/2022    7:48 AM 02/17/2022   11:00 AM 02/09/2022    1:00 PM 02/08/2022    4:16 PM  Advanced Directives  Does Patient Have a Medical Advance Directive? Yes No No No No No Yes  Type of Advance Directive Living will;Healthcare Power of [place];Living will  Does patient want to make changes to medical advance directive? No - Patient declined        Copy of [place] in Chart? No - copy requested        Would patient like information on creating a medical advance directive?  No - Patient declined  No - Patient declined No - Patient declined No - Patient declined     Current Medications (verified) Outpatient Encounter Medications as of 11/05/2022  Medication Sig   acetaminophen (TYLENOL) 325 MG tablet Take 2 tablets (650 mg total) by mouth every 6 (six) hours as needed for mild pain (or Fever >/= 101).   amiodarone (PACERONE) 200 MG tablet Take 0.5 tablets (100 mg total) by mouth daily.   atorvastatin (LIPITOR) 80 MG tablet Take 1 tablet (80 mg total) by mouth daily.   ELIQUIS 2.5 MG TABS tablet TAKE 1 TABLET BY MOUTH TWICE A DAY   empagliflozin (JARDIANCE) 10 MG TABS tablet Take 1 tablet (10 mg total) by mouth daily before breakfast.   finasteride (PROSCAR) 5 MG tablet Take 5 mg by mouth daily.   furosemide (LASIX) 40 MG tablet Take 0.5 tablets (20 mg total) by mouth daily.   linagliptin (TRADJENTA) 5 MG TABS tablet TAKE 1 TABLET (5 MG TOTAL) BY MOUTH DAILY.   metoprolol  succinate (TOPROL-XL) 50 MG 24 hr tablet Take 0.5 tablets (25 mg total) by mouth at bedtime. Take with or immediately following a meal.   nitroGLYCERIN (NITROSTAT) 0.4 MG SL tablet Place 1 tablet (0.4 mg total) under the tongue every 5 (five) minutes x 3 doses as needed for chest pain.   oxymetazoline (AFRIN) 0.05 % nasal spray Place 1 spray into both nostrils as needed for congestion.   spironolactone (ALDACTONE) 25 MG tablet Take 1 tablet (25 mg total) by mouth at bedtime.   fluticasone (FLONASE) 50 MCG/ACT nasal spray Place 1 spray into both nostrils daily as needed for allergies or rhinitis. (Patient not taking: Reported on 11/05/2022)   LORazepam (ATIVAN) 0.5 MG tablet TAKE 1 TABLET BY MOUTH TWICE A DAY AS NEEDED FOR ANXIETY (Patient not taking: Reported on 11/05/2022)   traZODone (DESYREL) 50 MG tablet TAKE 0.5 TABLETS BY MOUTH AT BEDTIME AS NEEDED FOR SLEEP. (Patient not taking: Reported on 11/05/2022)   [DISCONTINUED] ondansetron (ZOFRAN) 4 MG tablet Take 1 tablet (4 mg total) by mouth every 6 (six) hours as needed for nausea.   No facility-administered encounter medications on file as of 11/05/2022.    Allergies (verified) Patient has no known allergies.   History: Past  Medical History:  Diagnosis Date   Anal fissure    Arthritis    Atrial fibrillation (HCC)    Back pain    CHF (congestive heart failure) (HCC)    Colon polyps    Coronary artery disease    Diabetes mellitus type 2 with complications ([place])    Dysrhythmia    a-fib   GERD (gastroesophageal reflux disease)    History of echocardiogram 07/ 07/ 2011   History of lithotripsy 1989   Hyperlipidemia    Hypertension    Kidney stones    Melanoma ([place]) 05/01/2019   right chest wall (12/20)   Prostate CA ([place]) 2010   Sleep apnea    uses CPAP nightly   Squamous cell carcinoma of skin 05/23/1992   bowens-left parietal scalp (CX35FU)   Squamous cell carcinoma of skin 03/14/2008   in situ-left upper outer forehead-medial  (CX35FU)   Squamous cell carcinoma of skin 03/14/2008   in situ-crown of scalp (Cx35FU)   Squamous cell carcinoma of skin 04/15/2011   in situ-right dorsal forearm (txpbx)   Squamous cell carcinoma of skin 10/16/2011   in situ-left sideburn   Squamous cell carcinoma of skin 03/11/2015   ka-left sideburn (CX35FU)   Squamous cell carcinoma of skin 03/11/2015   ka-left forearm (CX35FU)   Squamous cell carcinoma of skin 06/22/2018   in situ-left forearm, sup (txpbx)   Squamous cell carcinoma of skin 05/01/2019   in situ-mid anterior scalp    Squamous cell carcinoma of skin 05/01/2019   in situ-right upper arm   Past Surgical History:  Procedure Laterality Date   ATRIAL FIBRILLATION ABLATION N/A 04/17/2020   Procedure: [place];  Surgeon: Vickie Epley, MD;  Location: [place] [facility];  Service: Cardiovascular;  Laterality: N/A;   AXILLARY SENTINEL NODE BIOPSY Right 08/23/2019   Procedure: SENTINEL LYMPH NODE BIOSPY RIGHT AXILLA;  Surgeon: Stark Klein, MD;  Location: [place];  Service: General;  Laterality: Right;   CARDIOVERSION N/A 05/10/2020   Procedure: CARDIOVERSION;  Surgeon: Freada Bergeron, MD;  Location: [place];  Service: Cardiovascular;  Laterality: N/A;   CARDIOVERSION N/A 05/08/2021   Procedure: CARDIOVERSION;  Surgeon: Donato Heinz, MD;  Location: [place];  Service: Cardiovascular;  Laterality: N/A;   CARDIOVERSION N/A 02/17/2022   Procedure: CARDIOVERSION;  Surgeon: Jerline Pain, MD;  Location: [facility] ENDOSCOPY;  Service: Cardiovascular;  Laterality: N/A;   COLONOSCOPY  2002   CORONARY ANGIOGRAPHY N/A 02/13/2022   Procedure: CORONARY ANGIOGRAPHY;  Surgeon: Sherren Mocha, MD;  Location: [place] [facility];  Service: Cardiovascular;  Laterality: N/A;   FASCIECTOMY Left 03/28/2019   Procedure: SEGMENTAL FASCIECTOMY LEFT RING FINGER;  Surgeon: Daryll Brod, MD;  Location: [place];  Service:  Orthopedics;  Laterality: Left;  ANESTHESIA  AXILLARY BLOCK   INSERTION PROSTATE RADIATION SEED  8 4 2010   per Dr Gwenevere Ghazi ULTRASOUND/IVUS N/A 02/13/2022   Procedure: Intravascular Ultrasound/IVUS;  Surgeon: Sherren Mocha, MD;  Location: [place] [facility];  Service: Cardiovascular;  Laterality: N/A;   MELANOMA EXCISION Right 06/22/2019   Procedure: WIDE LOCAL EXCISION RIGHT CHEST WALL MELANOMA, ADVANCEMENT FLAP CLOSURE FOR DEFECT 3X6 CM;  Surgeon: Stark Klein, MD;  Location: [place];  Service: General;  Laterality: Right;   POLYPECTOMY  2002   RIGHT/LEFT HEART CATH AND CORONARY ANGIOGRAPHY N/A 02/11/2022   Procedure: RIGHT/LEFT HEART CATH AND CORONARY ANGIOGRAPHY;  Surgeon: Nelva Bush, MD;  Location: [place]  [facility];  Service: Cardiovascular;  Laterality: N/A;   TEE WITHOUT CARDIOVERSION N/A 02/17/2022   Procedure: TRANSESOPHAGEAL ECHOCARDIOGRAM (TEE);  Surgeon: Jerline Pain, MD;  Location[facility] ENDOSCOPY;  Service: Cardiovascular;  Laterality: N/A;   THROAT SURGERY  1980   benign cyst   UPPER GASTROINTESTINAL ENDOSCOPY     URETHRAL STRICTURE DILATATION     also penile implant   Family History  Problem Relation Age of Onset   Coronary artery disease Brother        3 brothers had CABG   Arrhythmia Brother    Diabetes Brother    Hearing loss Brother    Hypertension Brother    Heart disease Brother    Heart failure Brother    Hypertension Brother    Heart disease Father    Hypertension Father    Sudden death Father    Stroke Mother        cerebral hemorrage   Colon cancer Mother 57       small intestine   Depression Mother    Stroke Sister    Suicidality Sister    Diabetes Brother    Hypertension Brother    Hyperlipidemia Sister    Esophageal cancer Neg Hx    Pancreatic cancer Neg Hx    Prostate cancer Neg Hx    Rectal cancer Neg Hx    Stomach cancer Neg Hx    Social History   Socioeconomic History   Marital status: Single     Spouse name: Not on file   Number of children: 0   Years of education: Not on file   Highest education level: Not on file  Occupational History   Occupation: retired  Tobacco Use   Smoking status: Never   Smokeless tobacco: Never  Vaping Use   Vaping Use: Never used  Substance and Sexual Activity   Alcohol use: Not Currently    Comment: social   Drug use: No   Sexual activity: Yes  Other Topics Concern   Not on file  Social History Narrative   Not on file   Social Determinants of Health   Financial Resource Strain: Low Risk  (11/05/2022)   Overall Financial Resource Strain (CARDIA)    Difficulty of Paying Living Expenses: Not hard at all  Food Insecurity: No Food Insecurity (11/05/2022)   Hunger Vital Sign    Worried About Running Out of Food in the Last Year: Never true    Ran Out of Food in the Last Year: Never true  Transportation Needs: No Transportation Needs (11/05/2022)   PRAPARE - Hydrologist (Medical): No    Lack of Transportation (Non-Medical): No  Physical Activity: Insufficiently Active (11/05/2022)   Exercise Vital Sign    Days of Exercise per Week: 3 days    Minutes of Exercise per Session: 30 min  Stress: No Stress Concern Present (11/05/2022)   [place]    Feeling of Stress : Not at all  Social Connections: Moderately Integrated (11/05/2022)   Social Connection and Isolation Panel [NHANES]    Frequency of Communication with Friends and Family: More than three times a week    Frequency of Social Gatherings with Friends and Family: More than three times a week    Attends Religious Services: More than 4 times per year    Active Member of [employer] or Organizations: Yes    Attends Archivist Meetings: More than 4 times per  year    Marital Status: Never married    Tobacco Counseling Counseling given: Not Answered   Clinical Intake:  Pre-visit preparation  completed: Yes  Pain : No/denies pain     Diabetes: Yes CBG done?: No Did pt. bring in CBG monitor from home?: No  How often do you need to have someone help you when you read instructions, pamphlets, or other written materials from your doctor or pharmacy?: 1 - Never  Diabetic?Yes   Nutrition Risk Assessment:  Has the patient had any N/V/D within the last 2 months?   Intermittent episodes that he describes as once a week  Does the patient have any non-healing wounds?  No  Has the patient had any unintentional weight loss or weight gain?  No   Diabetes:  Is the patient diabetic?  Yes  If diabetic, was a CBG obtained today?  No  Did the patient bring in their glucometer from home?  No  How often do you monitor your CBG's? As needed .   Financial Strains and Diabetes Management:  Are you having any financial strains with the device, your supplies or your medication? No .  Does the patient want to be seen by Chronic Care Management for management of their diabetes?  No  Would the patient like to be referred to a Nutritionist or for Diabetic Management?  No   Diabetic Exams:  Diabetic Eye Exam: Completed records requested  Diabetic Foot Exam: Overdue, Pt has been advised about the importance in completing this exam. Pt is scheduled for diabetic foot exam on at next office visit .   Interpreter Needed?: No  Information entered by :: Denman George LPN   Activities of Daily Living    11/05/2022   12:55 PM 04/08/2022   12:39 PM  In your present state of health, do you have any difficulty performing the following activities:  Hearing? 0 1  Vision? 0 0  Difficulty concentrating or making decisions? 0 0  Walking or climbing stairs? 0 0  Dressing or bathing? 0 0  Doing errands, shopping? 0 0  Preparing Food and eating ? N   Using the Toilet? N   In the past six months, have you accidently leaked urine? N   Do you have problems with loss of bowel control? N   Managing  your Medications? N   Managing your Finances? N   Housekeeping or managing your Housekeeping? N     Patient Care Team: Susy Frizzle, MD as PCP - General (Family Medicine) Jerline Pain, MD as PCP - Cardiology (Cardiology) Vickie Epley, MD as PCP - Electrophysiology (Cardiology) Lavonna Monarch, MD (Inactive) as Consulting Physician (Dermatology) Edythe Clarity, [facility] as Pharmacist (Pharmacist)  Indicate any recent Medical Services you may have received from other than [facility] providers in the past year (date may be approximate).     Assessment:   This is a routine wellness examination for Liliana.  Hearing/Vision screen Hearing Screening - Comments:: Denies hearing difficulties  Vision Screening - Comments:: up to date with routine eye exams with Dr Celene Squibb    Dietary issues and exercise activities discussed: Current Exercise Habits: Home exercise routine, Type of exercise: walking, Time (Minutes): 30, Frequency (Times/Week): 3, Weekly Exercise (Minutes/Week): 90, Intensity: Mild   Goals Addressed   None   Depression Screen    11/05/2022   12:54 PM 09/29/2022   11:39 AM 07/21/2022   11:11 AM 06/25/2022    2:55 PM 02/27/2022  1:19 PM 01/20/2022   11:41 AM 12/29/2021    4:31 PM  PHQ 2/9 Scores  PHQ - 2 Score 0 2 1 2  0 0 0  PHQ- 9 Score  6 2 10        Fall Risk    11/05/2022   12:52 PM 06/17/2022    3:45 PM 04/08/2022   12:39 PM 02/27/2022    1:18 PM 01/20/2022   11:41 AM  Fall Risk   Falls in the past year? 0  1 1 0  Number falls in past yr: 0 1 0 1   Injury with Fall? 0 0 0 1   Risk for fall due to : No Fall Risks Impaired balance/gait History of fall(s) Impaired balance/gait;Impaired mobility;History of fall(s)   Follow up Falls prevention discussed;Education provided;Falls evaluation completed Education provided;Falls prevention discussed Falls evaluation completed Falls evaluation completed;Falls prevention discussed;Education provided Falls evaluation  completed    FALL RISK PREVENTION PERTAINING TO THE HOME:  Any stairs in or around the home? Yes  If so, are there any without handrails? No  Home free of loose throw rugs in walkways, pet beds, electrical cords, etc? Yes  Adequate lighting in your home to reduce risk of falls? Yes   ASSISTIVE DEVICES UTILIZED TO PREVENT FALLS:  Life alert? No  Use of a cane, walker or w/c? No  Grab bars in the bathroom? Yes  Shower chair or bench in shower? No  Elevated toilet seat or a handicapped toilet? Yes   TIMED UP AND GO:  Was the test performed? No .  Length of time to ambulate 10 feet: 6 sec.   Gait steady and fast without use of assistive device  Cognitive Function:        11/05/2022   12:56 PM 06/07/2020    9:33 AM  6CIT Screen  What Year? 0 points 0 points  What month? 0 points 0 points  What time? 0 points 0 points  Count back from 20 0 points 0 points  Months in reverse 0 points 0 points  Repeat phrase 0 points 0 points  Total Score 0 points 0 points    Immunizations Immunization History  Administered Date(s) Administered   Fluad Quad(high Dose 65+) 04/27/2019, 05/21/2020   Influenza, High Dose Seasonal PF 06/07/2017, 05/07/2018   Influenza,inj,Quad PF,6+ Mos 05/12/2016   Influenza-Unspecified 05/30/2015   PFIZER(Purple Top)SARS-COV-2 Vaccination 09/01/2019, 09/21/2019, 05/28/2020   Pneumococcal Conjugate-13 07/08/2015   Pneumococcal Polysaccharide-23 01/20/2016   Tdap 06/11/2011   Zoster Recombinat (Shingrix) 07/09/2017, 09/30/2017    TDAP status: Due, Education has been provided regarding the importance of this vaccine. Advised may receive this vaccine at local pharmacy or Health Dept. Aware to provide a copy of the vaccination record if obtained from local pharmacy or Health Dept. Verbalized acceptance and understanding.  Flu Vaccine status: Up to date  Pneumococcal vaccine status: Up to date  Covid-19 vaccine status: Declined, Education has been provided  regarding the importance of this vaccine but patient still declined. Advised may receive this vaccine at local pharmacy or Health Dept.or vaccine clinic. Aware to provide a copy of the vaccination record if obtained from local pharmacy or Health Dept. Verbalized acceptance and understanding.  Qualifies for Shingles Vaccine? Yes   Zostavax completed No   Shingrix Completed?: Yes  Screening Tests Health Maintenance  Topic Date Due   OPHTHALMOLOGY EXAM  Never done   Diabetic kidney evaluation - Urine ACR  Never done   FOOT EXAM  08/16/2020  DTaP/Tdap/Td (2 - Td or Tdap) 06/10/2021   INFLUENZA VACCINE  03/10/2022   COVID-19 Vaccine (4 - 2023-24 season) 04/10/2022   HEMOGLOBIN A1C  08/12/2022   Diabetic kidney evaluation - eGFR measurement  08/20/2023   Medicare Annual Wellness (AWV)  11/05/2023   Pneumonia Vaccine 53+ Years old  Completed   Zoster Vaccines- Shingrix  Completed   HPV VACCINES  Aged Out    Health Maintenance  Health Maintenance Due  Topic Date Due   OPHTHALMOLOGY EXAM  Never done   Diabetic kidney evaluation - Urine ACR  Never done   FOOT EXAM  08/16/2020   DTaP/Tdap/Td (2 - Td or Tdap) 06/10/2021   INFLUENZA VACCINE  03/10/2022   COVID-19 Vaccine (4 - 2023-24 season) 04/10/2022   HEMOGLOBIN A1C  08/12/2022    Colorectal cancer screening: No longer required.   Lung Cancer Screening: (Low Dose CT Chest recommended if Age 52-80 years, 30 pack-year currently smoking OR have quit w/in 15years.) does not qualify.   Lung Cancer Screening Referral: n/a  Additional Screening:  Hepatitis C Screening: does not qualify;   Vision Screening: Recommended annual ophthalmology exams for early detection of glaucoma and other disorders of the eye. Is the patient up to date with their annual eye exam?  Yes  Who is the provider or what is the name of the office in which the patient attends annual eye exams? Dr.McQuen  If pt is not established with a provider, would they like  to be referred to a provider to establish care? No .   Dental Screening: Recommended annual dental exams for proper oral hygiene  Community Resource Referral / Chronic Care Management: CRR required this visit?  No   CCM required this visit?  No      Plan:     I have personally reviewed and noted the following in the patient's chart:   Medical and social history Use of alcohol, tobacco or illicit drugs  Current medications and supplements including opioid prescriptions. Patient is not currently taking opioid prescriptions. Functional ability and status Nutritional status Physical activity Advanced directives List of other physicians Hospitalizations, surgeries, and ER visits in previous 12 months Vitals Screenings to include cognitive, depression, and falls Referrals and appointments  In addition, I have reviewed and discussed with patient certain preventive protocols, quality metrics, and best practice recommendations. A written personalized care plan for preventive services as well as general preventive health recommendations were provided to patient.     Denman George [place], [place]   [id number]   Nurse Notes: Patient states that he has diarrhea once a week without known cause.  Scheduled for office visit

## 2022-11-06 ENCOUNTER — Other Ambulatory Visit (HOSPITAL_COMMUNITY): Payer: Self-pay | Admitting: Cardiology

## 2022-11-11 LAB — HM DIABETES EYE EXAM

## 2022-11-13 NOTE — Progress Notes (Signed)
Patient ID: Antonio Clark, male   DOB: 06/19/1936, 87 y.o.   MRN: [id number] PCP: Dr. Tanya Nones HF Cardiology: Dr. Shirlee Latch  87 y.o. with history of HTN, hyperlipidemia, CAD, atrial fibrillation, and CHF was referred by Dr. Anne Fu for evaluation of CHF.  Patient had atrial fibrillation diagnosed in 2019.  He had atrial fibrillation ablation in 9/21.  He was admitted in 7/23 with CHF and was found to have atrial fibrillation with RVR. Echo this admission showed EF 25-30%, mild LV dilation, RV normal, moderate biatrial enlargement.  LHC/RHC showed elevated PCWP and 70% left main stenosis.  By IVUS, the left main stenosis did not appear significant so PCI was not done.  Patient had TEE-guided DCCV.   Echo was done today and reviewed, EF 40-45%, mild global hypokinesis, mild LVH, mildly decreased RV systolic function.   Patient returns for followup of CHF.  He is in NSR today and denies palpitations.  He is feeling better overall, less fatigued.  He has been going to cardiac rehab.  No chest pain. No lightheadedness.  No orthopnea/PND. Weight is up.   ECG: NSR, nonspecific TW flattening  Labs (10/23): K 4.9, creatinine 1.94, hgb 13.8, LDL 54, TSH normal, BNP 126 Labs (11/23): K 4.6, creatinine 1.93, LFTs normal  PMH: 1. Prostate cancer: s/p seed implantation 8/10.  2. Hyperlipidemia: Myalgias with one of the statins, ? Crestor 3. HTN 4. Type 2 diabetes 5. CKD stage 3 6. Atrial fibrillation: Paroxysmal, diagnosed in 2019.  - Atrial fibrillation ablation in 9/21.  - DCCV 7/23 7. OSA: Cannot tolerate CPAP 8. Melanoma 9. CAD: LHC in 7/23 showed 70% osital left main stenosis, IVUS suggested not significant and PCI not done.  10. Chronic systolic CHF: Suspect primarily nonischemic, ?tachycardia-mediated.  - RHC (7/23): mean RA 8, PA 50/22, mean PCWP 20, CI 2.4 - Echo (7/23): EF 25-30%, mild LV dilation, RV normal, moderate biatrial enlargement.  - TEE (7/23): EF < 20%, moderate RV dysfunction, severe  biatrial enlargement, mild-moderate MR.   - Echo (12/23): EF 40-45%, mild global hypokinesis, mild LVH, mildly decreased RV systolic function.  SH: Never smoked, lives in [place], retired from [employer], never married  FH: 3 brothers with CAD, all found in late 3s-70s.  Father with sudden cardiac death.  Mother with CVA.   ROS: All systems reviewed and negative except as per HPI.   Current Outpatient Medications  Medication Sig Dispense Refill   acetaminophen (TYLENOL) 325 MG tablet Take 2 tablets (650 mg total) by mouth every 6 (six) hours as needed for mild pain (or Fever >/= 101).     amiodarone (PACERONE) 200 MG tablet Take 0.5 tablets (100 mg total) by mouth daily. 45 tablet 3   atorvastatin (LIPITOR) 80 MG tablet Take 1 tablet (80 mg total) by mouth daily. 90 tablet 3   ELIQUIS 2.5 MG TABS tablet TAKE 1 TABLET BY MOUTH TWICE A DAY 180 tablet 1   empagliflozin (JARDIANCE) 10 MG TABS tablet Take 1 tablet (10 mg total) by mouth daily before breakfast. 90 tablet 3   finasteride (PROSCAR) 5 MG tablet Take 5 mg by mouth daily.     fluticasone (FLONASE) 50 MCG/ACT nasal spray Place 1 spray into both nostrils daily as needed for allergies or rhinitis. (Patient not taking: Reported on 11/05/2022)     furosemide (LASIX) 40 MG tablet Take 0.5 tablets (20 mg total) by mouth daily. 90 tablet 3   linagliptin (TRADJENTA) 5 MG TABS tablet TAKE 1 TABLET (  5 MG TOTAL) BY MOUTH DAILY. 90 tablet 0   LORazepam (ATIVAN) 0.5 MG tablet TAKE 1 TABLET BY MOUTH TWICE A DAY AS NEEDED FOR ANXIETY (Patient not taking: Reported on 11/05/2022) 30 tablet 1   metoprolol succinate (TOPROL-XL) 50 MG 24 hr tablet Take 0.5 tablets (25 mg total) by mouth at bedtime. Take with or immediately following a meal. 30 tablet 5   nitroGLYCERIN (NITROSTAT) 0.4 MG SL tablet Place 1 tablet (0.4 mg total) under the tongue every 5 (five) minutes x 3 doses as needed for chest pain. 25 tablet 12   oxymetazoline (AFRIN) 0.05 %  nasal spray Place 1 spray into both nostrils as needed for congestion.     spironolactone (ALDACTONE) 25 MG tablet TAKE 1 TABLET BY MOUTH EVERYDAY AT BEDTIME 90 tablet 1   traZODone (DESYREL) 50 MG tablet TAKE 0.5 TABLETS BY MOUTH AT BEDTIME AS NEEDED FOR SLEEP. (Patient not taking: Reported on 11/05/2022) 45 tablet 3   No current facility-administered medications for this visit.    There were no vitals taken for this visit. General: NAD Neck: No JVD, no thyromegaly or thyroid nodule.  Lungs: Clear to auscultation bilaterally with normal respiratory effort. CV: Nondisplaced PMI.  Heart regular S1/S2, no S3/S4, no murmur.  No peripheral edema.  No carotid bruit.  Normal pedal pulses.  Abdomen: Soft, nontender, no hepatosplenomegaly, no distention.  Skin: Intact without lesions or rashes.  Neurologic: Alert and oriented x 3.  Psych: Normal affect. Extremities: No clubbing or cyanosis.  HEENT: Normal.   Assessment/Plan: 1. CAD:  70% left main stenosis on 7/23 cath.  IVUS suggested that this was not hemodynamically significant and PCI was not done.  No chest pain.  - Continue atorvastatin 80 mg daily, good lipids in 10/23.  - Given Eliquis use and no h/o PCI, he is not on ASA.  2. Chronic systolic CHF: Suspect nonischemic cardiomyopathy, possibly tachycardia-mediated. As above, he has coronary disease but this did not appear hemodynamically significant by IVUS evaluation.  Echo in 7/23 showed EF 25-30%, mild LV dilation, RV normal, moderate biatrial enlargement. He has been in NSR, and echo today showed EF up to 40-45%, mild global hypokinesis, mild LVH, mildly decreased RV systolic function.  He is not volume overloaded on exam.  NYHA class II symptoms.  BP remains soft.  - Continue Toprol XL 25 mg daily.  - Increase spironolactone to 25 mg daily, BMET today and in 10 days.  - Continue empagliflozin 10 mg daily.  - Would not start ARB/ARNI with low BP and elevated creatinine.  - Continue  cardiac rehab.  3. Atrial fibrillation: Paroxysmal, s/p ablation in 9/21 and DCCV in 7/23.  He is in NSR today, symptoms worse when in AF.  ?Tachycardia-mediated CMP. Need to keep in NSR as EF has improved in NSR (but not back to normal).  - Continue Eliquis 2.5 mg bid (age, creatinine).  - Continue amiodarone 100 mg daily, check LFTs and TSH.  He will need a regular eye exam.  3. Hyperlipidemia: Good lipids in 10/23. 4. OSA: Unable to tolerate CPAP.  5. CKD: Stage 3, BMET today.   Followup in 3 months with APP  [facility] 11/13/2022

## 2022-11-16 ENCOUNTER — Encounter (HOSPITAL_COMMUNITY): Payer: Self-pay

## 2022-11-16 ENCOUNTER — Ambulatory Visit (HOSPITAL_COMMUNITY)
Admission: RE | Admit: 2022-11-16 | Discharge: 2022-11-16 | Disposition: A | Discharge status: Home / Self Care | Payer: Medicare Other | Source: Ambulatory Visit | Attending: Family Medicine | Admitting: Family Medicine

## 2022-11-16 VITALS — BP 120/76 | HR 51 | Ht 69.0 in | Wt 172.6 lb

## 2022-11-16 DIAGNOSIS — I480 Paroxysmal atrial fibrillation: Secondary | ICD-10-CM | POA: Diagnosis not present

## 2022-11-16 DIAGNOSIS — N183 Chronic kidney disease, stage 3 unspecified: Secondary | ICD-10-CM | POA: Diagnosis not present

## 2022-11-16 DIAGNOSIS — Z7984 Long term (current) use of oral hypoglycemic drugs: Secondary | ICD-10-CM | POA: Insufficient documentation

## 2022-11-16 DIAGNOSIS — G4733 Obstructive sleep apnea (adult) (pediatric): Secondary | ICD-10-CM | POA: Diagnosis not present

## 2022-11-16 DIAGNOSIS — Z7901 Long term (current) use of anticoagulants: Secondary | ICD-10-CM | POA: Insufficient documentation

## 2022-11-16 DIAGNOSIS — Z79899 Other long term (current) drug therapy: Secondary | ICD-10-CM | POA: Diagnosis not present

## 2022-11-16 DIAGNOSIS — E1122 Type 2 diabetes mellitus with diabetic chronic kidney disease: Secondary | ICD-10-CM | POA: Insufficient documentation

## 2022-11-16 DIAGNOSIS — I2510 Atherosclerotic heart disease of native coronary artery without angina pectoris: Secondary | ICD-10-CM | POA: Diagnosis present

## 2022-11-16 DIAGNOSIS — I130 Hypertensive heart and chronic kidney disease with heart failure and stage 1 through stage 4 chronic kidney disease, or unspecified chronic kidney disease: Secondary | ICD-10-CM | POA: Diagnosis not present

## 2022-11-16 DIAGNOSIS — R197 Diarrhea, unspecified: Secondary | ICD-10-CM | POA: Diagnosis not present

## 2022-11-16 DIAGNOSIS — I4819 Other persistent atrial fibrillation: Secondary | ICD-10-CM

## 2022-11-16 DIAGNOSIS — E785 Hyperlipidemia, unspecified: Secondary | ICD-10-CM | POA: Diagnosis not present

## 2022-11-16 DIAGNOSIS — I5022 Chronic systolic (congestive) heart failure: Secondary | ICD-10-CM

## 2022-11-16 LAB — BASIC METABOLIC PANEL
Anion gap: 9 (ref 5–15)
BUN: 28 mg/dL — ABNORMAL HIGH (ref 8–23)
CO2: 21 mmol/L — ABNORMAL LOW (ref 22–32)
Calcium: 9.4 mg/dL (ref 8.9–10.3)
Chloride: 108 mmol/L (ref 98–111)
Creatinine, Ser: 1.85 mg/dL — ABNORMAL HIGH (ref 0.61–1.24)
GFR, Estimated: 35 mL/min — ABNORMAL LOW (ref >60)
Glucose, Bld: 115 mg/dL — ABNORMAL HIGH (ref 70–99)
Potassium: 4.7 mmol/L (ref 3.5–5.1)
Sodium: 138 mmol/L (ref 135–145)

## 2022-11-16 LAB — BRAIN NATRIURETIC PEPTIDE: B Natriuretic Peptide: 155.3 pg/mL — ABNORMAL HIGH (ref 0.0–100.0)

## 2022-11-16 MED ORDER — FUROSEMIDE 40 MG PO TABS: 20.0000 mg | ORAL_TABLET | ORAL | 8 refills | Status: DC | PRN

## 2022-11-16 NOTE — Patient Instructions (Addendum)
Thank you for coming in today  Labs were done today, if any labs are abnormal the clinic will call you No news is good news  Medications: Its ok to take Metoprolol nightly  Change Lasix to 20 mg as needed daily For weight gain of 3 lbs in 24 hours or 5 lbs in a week   Follow up appointments:  Your physician recommends that you schedule a follow-up appointment in:  4 months With Dr. Earlean Shawl will receive a reminder letter in the mail a few months in advance. If you don't receive a letter, please call our office to schedule the follow-up appointment.     Do the following things EVERYDAY: Weigh yourself in the morning before breakfast. Write it down and keep it in a log. Take your medicines as prescribed Eat low salt foods--Limit salt (sodium) to 2000 mg per day.  Stay as active as you can everyday Limit all fluids for the day to less than 2 liters   At the [facility], you and your health needs are our priority. As part of our continuing mission to provide you with exceptional heart care, we have created designated Provider Care Teams. These Care Teams include your primary Cardiologist (physician) and Advanced Practice Providers (APPs- Physician Assistants and Nurse Practitioners) who all work together to provide you with the care you need, when you need it.   You may see any of the following providers on your designated Care Team at your next follow up: Dr Arvilla Meres Dr [name] Dr. Marcos Eke, NP Robbie Lis, [place] [facility] [place], [place] Brynda Peon, NP Karle Plumber, PharmD   Please be sure to bring in all your medications bottles to every appointment.    Thank you for choosing [facility]  If you have any questions or concerns before your next appointment please send [place] a message through [place] or call our office at [phone].    TO LEAVE A MESSAGE FOR THE NURSE  SELECT OPTION 2, PLEASE LEAVE A MESSAGE INCLUDING: YOUR NAME DATE OF BIRTH CALL BACK NUMBER REASON FOR CALL**this is important as we prioritize the call backs  YOU WILL RECEIVE A CALL BACK THE SAME DAY AS LONG AS YOU CALL BEFORE 4:00 PM

## 2022-11-17 ENCOUNTER — Encounter: Payer: Self-pay | Admitting: Family Medicine

## 2022-11-18 ENCOUNTER — Other Ambulatory Visit: Payer: Self-pay

## 2022-11-18 ENCOUNTER — Telehealth: Payer: Self-pay

## 2022-11-18 DIAGNOSIS — E119 Type 2 diabetes mellitus without complications: Secondary | ICD-10-CM

## 2022-11-18 MED ORDER — LINAGLIPTIN 5 MG PO TABS: 5.0000 mg | ORAL_TABLET | Freq: Every day | ORAL | 0 refills | Status: DC

## 2022-11-18 NOTE — Telephone Encounter (Signed)
Prescription Request  11/18/2022  LOV: UPCOMING APPT 11/24/22  What is the name of the medication or equipment? linagliptin (TRADJENTA) 5 MG TABS tablet [[id number]]  Have you contacted your pharmacy to request a refill? Yes   Which pharmacy would you like this sent to?  [id number]ann Sheen, [place] - [address] [place] [address] Phone: [phone] Fax: [phone]    Patient notified that their request is being sent to the clinical staff for review and that they should receive a response within 2 business days.   Please advise at [facility] [phone]

## 2022-11-24 ENCOUNTER — Ambulatory Visit: Payer: Medicare Other | Admitting: Family Medicine

## 2022-11-24 ENCOUNTER — Encounter: Payer: Self-pay | Admitting: Family Medicine

## 2022-11-24 VITALS — BP 122/78 | HR 62 | Temp 98.7°F | Ht 69.0 in | Wt 174.0 lb

## 2022-11-24 DIAGNOSIS — I480 Paroxysmal atrial fibrillation: Secondary | ICD-10-CM | POA: Diagnosis not present

## 2022-11-24 DIAGNOSIS — N1832 Chronic kidney disease, stage 3b: Secondary | ICD-10-CM

## 2022-11-24 DIAGNOSIS — E118 Type 2 diabetes mellitus with unspecified complications: Secondary | ICD-10-CM

## 2022-11-24 DIAGNOSIS — I5022 Chronic systolic (congestive) heart failure: Secondary | ICD-10-CM

## 2022-11-24 NOTE — Progress Notes (Signed)
Wt Readings from Last 3 Encounters:  11/24/22 174 lb (78.9 kg)  11/16/22 172 lb 9.6 oz (78.3 kg)  11/05/22 174 lb (78.9 kg)     Subjective:    Patient ID: Antonio Clark, male    DOB: Dec 24, 1935, 87 y.o.   MRN: [id number] Patient is a very pleasant 87 year old gentleman with a history of hypertension, atrial fibrillation, congestive heart failure, type 2 diabetes.  His blood pressure today is well-controlled.  He denies any chest pain or shortness of breath or dyspnea on exertion.  There is no evidence of fluid overload on exam.  His weight is stable.  He is currently on a beta-blocker, spironolactone, and Jardiance.  He also has chronic kidney disease with a baseline creatinine around 1.8.  His potassium has remained normal despite taking the spironolactone and the Jardiance.  His GFR is still greater then 35.  Therefore he is on Jardiance for CHF.  However he is also taking Tradjenta for his diabetes.  His last A1c was checked 1 year ago was 6.4.  He is overdue to check that today.  He also has a history of atrial fibrillation for which he takes amiodarone and is anticoagulated with Eliquis Past Medical History:  Diagnosis Date  . Anal fissure   . Arthritis   . Atrial fibrillation   . Back pain   . CHF (congestive heart failure)   . Colon polyps   . Coronary artery disease   . Diabetes mellitus type 2 with complications   . Dysrhythmia    a-fib  . GERD (gastroesophageal reflux disease)   . History of echocardiogram 07/ 07/ 2011  . History of lithotripsy 1989  . Hyperlipidemia   . Hypertension   . Kidney stones   . Melanoma 05/01/2019   right chest wall (12/20)  . Prostate CA 2010  . Sleep apnea    uses CPAP nightly  . Squamous cell carcinoma of skin 05/23/1992   bowens-left parietal scalp (CX35FU)  . Squamous cell carcinoma of skin 03/14/2008   in situ-left upper outer forehead-medial (CX35FU)  . Squamous cell carcinoma of skin 03/14/2008   in situ-crown of scalp (Cx35FU)  .  Squamous cell carcinoma of skin 04/15/2011   in situ-right dorsal forearm (txpbx)  . Squamous cell carcinoma of skin 10/16/2011   in situ-left sideburn  . Squamous cell carcinoma of skin 03/11/2015   ka-left sideburn (CX35FU)  . Squamous cell carcinoma of skin 03/11/2015   ka-left forearm (CX35FU)  . Squamous cell carcinoma of skin 06/22/2018   in situ-left forearm, sup (txpbx)  . Squamous cell carcinoma of skin 05/01/2019   in situ-mid anterior scalp   . Squamous cell carcinoma of skin 05/01/2019   in situ-right upper arm   Past Surgical History:  Procedure Laterality Date  . ATRIAL FIBRILLATION ABLATION N/A 04/17/2020   Procedure: ATRIAL FIBRILLATION ABLATION;  Surgeon: Lanier Prude, MD;  Location[facility] INVASIVE CV LAB;  Service: Cardiovascular;  Laterality: N/A;  . AXILLARY SENTINEL NODE BIOPSY Right 08/23/2019   Procedure: SENTINEL LYMPH NODE BIOSPY RIGHT AXILLA;  Surgeon: Almond Lint, MD;  Location: [facility];  Service: General;  Laterality: Right;  . CARDIOVERSION N/A 05/10/2020   Procedure: CARDIOVERSION;  Surgeon: Meriam Sprague, MD;  Location: [facility] ENDOSCOPY;  Service: Cardiovascular;  Laterality: N/A;  . CARDIOVERSION N/A 05/08/2021   Procedure: CARDIOVERSION;  Surgeon: Little Ishikawa, MD;  Location: [facility] ENDOSCOPY;  Service: Cardiovascular;  Laterality: N/A;  . CARDIOVERSION N/A 02/17/2022  Procedure: CARDIOVERSION;  Surgeon: Jake Bathe, MD;  Location: [facility] ENDOSCOPY;  Service: Cardiovascular;  Laterality: N/A;  . COLONOSCOPY  2002  . CORONARY ANGIOGRAPHY N/A 02/13/2022   Procedure: CORONARY ANGIOGRAPHY;  Surgeon: Tonny Bollman, MD;  Location: [facility] INVASIVE CV LAB;  Service: Cardiovascular;  Laterality: N/A;  . CORONARY ULTRASOUND/IVUS N/A 02/13/2022   Procedure: Intravascular Ultrasound/IVUS;  Surgeon: Tonny Bollman, MD;  Location: [facility] INVASIVE CV LAB;  Service: Cardiovascular;  Laterality: N/A;  . FASCIECTOMY Left 03/28/2019   Procedure:  SEGMENTAL FASCIECTOMY LEFT RING FINGER;  Surgeon: Cindee Salt, MD;  Location: [facility];  Service: Orthopedics;  Laterality: Left;  ANESTHESIA  AXILLARY BLOCK  . INSERTION PROSTATE RADIATION SEED  8 4 2010   per Dr Earlene Plater  . MELANOMA EXCISION Right 06/22/2019   Procedure: WIDE LOCAL EXCISION RIGHT CHEST WALL MELANOMA, ADVANCEMENT FLAP CLOSURE FOR DEFECT 3X6 CM;  Surgeon: Almond Lint, MD;  Location: [facility];  Service: General;  Laterality: Right;  . POLYPECTOMY  2002  . RIGHT/LEFT HEART CATH AND CORONARY ANGIOGRAPHY N/A 02/11/2022   Procedure: RIGHT/LEFT HEART CATH AND CORONARY ANGIOGRAPHY;  Surgeon: Yvonne Kendall, MD;  Location: [facility] INVASIVE CV LAB;  Service: Cardiovascular;  Laterality: N/A;  . TEE WITHOUT CARDIOVERSION N/A 02/17/2022   Procedure: TRANSESOPHAGEAL ECHOCARDIOGRAM (TEE);  Surgeon: Jake Bathe, MD;  Location[facility] ENDOSCOPY;  Service: Cardiovascular;  Laterality: N/A;  . THROAT SURGERY  1980   benign cyst  . UPPER GASTROINTESTINAL ENDOSCOPY    . URETHRAL STRICTURE DILATATION     also penile implant   Current Outpatient Medications on File Prior to Visit  Medication Sig Dispense Refill  . acetaminophen (TYLENOL) 325 MG tablet Take 2 tablets (650 mg total) by mouth every 6 (six) hours as needed for mild pain (or Fever >/= 101).    Marland Kitchen amiodarone (PACERONE) 200 MG tablet Take 0.5 tablets (100 mg total) by mouth daily. 45 tablet 3  . atorvastatin (LIPITOR) 80 MG tablet Take 1 tablet (80 mg total) by mouth daily. 90 tablet 3  . ELIQUIS 2.5 MG TABS tablet TAKE 1 TABLET BY MOUTH TWICE A DAY 180 tablet 1  . empagliflozin (JARDIANCE) 10 MG TABS tablet Take 1 tablet (10 mg total) by mouth daily before breakfast. 90 tablet 3  . finasteride (PROSCAR) 5 MG tablet Take 5 mg by mouth daily.    . furosemide (LASIX) 40 MG tablet Take 0.5 tablets (20 mg total) by mouth as needed (For weight gain of 3 lbs in 24 hours or 5 lbs in a week). 30 tablet 8  .  linagliptin (TRADJENTA) 5 MG TABS tablet Take 1 tablet (5 mg total) by mouth daily. 90 tablet 0  . LORazepam (ATIVAN) 0.5 MG tablet TAKE 1 TABLET BY MOUTH TWICE A DAY AS NEEDED FOR ANXIETY 30 tablet 1  . metoprolol succinate (TOPROL-XL) 50 MG 24 hr tablet Take 0.5 tablets (25 mg total) by mouth at bedtime. Take with or immediately following a meal. 30 tablet 5  . nitroGLYCERIN (NITROSTAT) 0.4 MG SL tablet Place 1 tablet (0.4 mg total) under the tongue every 5 (five) minutes x 3 doses as needed for chest pain. 25 tablet 12  . oxymetazoline (AFRIN) 0.05 % nasal spray Place 1 spray into both nostrils as needed for congestion.    Marland Kitchen spironolactone (ALDACTONE) 25 MG tablet TAKE 1 TABLET BY MOUTH EVERYDAY AT BEDTIME (Patient taking differently: 12.5 mg daily.) 90 tablet 1  . traZODone (DESYREL) 50 MG tablet TAKE 0.5  TABLETS BY MOUTH AT BEDTIME AS NEEDED FOR SLEEP. 45 tablet 3   No current facility-administered medications on file prior to visit.   No Known Allergies Social History   Socioeconomic History  . Marital status: Single    Spouse name: Not on file  . Number of children: 0  . Years of education: Not on file  . Highest education level: Not on file  Occupational History  . Occupation: retired  Tobacco Use  . Smoking status: Never  . Smokeless tobacco: Never  Vaping Use  . Vaping Use: Never used  Substance and Sexual Activity  . Alcohol use: Not Currently    Comment: social  . Drug use: No  . Sexual activity: Yes  Other Topics Concern  . Not on file  Social History Narrative  . Not on file   Social Determinants of Health   Financial Resource Strain: Low Risk  (11/05/2022)   Overall Financial Resource Strain (CARDIA)   . Difficulty of Paying Living Expenses: Not hard at all  Food Insecurity: No Food Insecurity (11/05/2022)   Hunger Vital Sign   . Worried About Programme researcher, broadcasting/film/video in the Last Year: Never true   . Ran Out of Food in the Last Year: Never true  Transportation  Needs: No Transportation Needs (11/05/2022)   PRAPARE - Transportation   . Lack of Transportation (Medical): No   . Lack of Transportation (Non-Medical): No  Physical Activity: Insufficiently Active (11/05/2022)   Exercise Vital Sign   . Days of Exercise per Week: 3 days   . Minutes of Exercise per Session: 30 min  Stress: No Stress Concern Present (11/05/2022)   [employer] of Occupational Health - Occupational Stress Questionnaire   . Feeling of Stress : Not at all  Social Connections: Moderately Integrated (11/05/2022)   Social Connection and Isolation Panel [NHANES]   . Frequency of Communication with Friends and Family: More than three times a week   . Frequency of Social Gatherings with Friends and Family: More than three times a week   . Attends Religious Services: More than 4 times per year   . Active Member of Clubs or Organizations: Yes   . Attends Banker Meetings: More than 4 times per year   . Marital Status: Never married  Intimate Partner Violence: Not At Risk (11/05/2022)   Humiliation, Afraid, Rape, and Kick questionnaire   . Fear of Current or Ex-Partner: No   . Emotionally Abused: No   . Physically Abused: No   . Sexually Abused: No   Family History  Problem Relation Age of Onset  . Coronary artery disease Brother        3 brothers had CABG  . Arrhythmia Brother   . Diabetes Brother   . Hearing loss Brother   . Hypertension Brother   . Heart disease Brother   . Heart failure Brother   . Hypertension Brother   . Heart disease Father   . Hypertension Father   . Sudden death Father   . Stroke Mother        cerebral hemorrage  . Colon cancer Mother 60       small intestine  . Depression Mother   . Stroke Sister   . Suicidality Sister   . Diabetes Brother   . Hypertension Brother   . Hyperlipidemia Sister   . Esophageal cancer Neg Hx   . Pancreatic cancer Neg Hx   . Prostate cancer Neg Hx   . Rectal  cancer Neg Hx   . Stomach cancer Neg  Hx      Review of Systems  All other systems reviewed and are negative.      Objective:   Physical Exam Vitals reviewed.  Constitutional:      General: He is not in acute distress.    Appearance: He is well-developed. He is not diaphoretic.  HENT:     Head: Normocephalic and atraumatic.     Right Ear: External ear normal.     Left Ear: External ear normal.     Nose: Nose normal.     Mouth/Throat:     Pharynx: No oropharyngeal exudate.  Eyes:     General: No scleral icterus.       Right eye: No discharge.        Left eye: No discharge.     Conjunctiva/sclera: Conjunctivae normal.     Pupils: Pupils are equal, round, and reactive to light.  Neck:     Thyroid: No thyromegaly.     Vascular: No JVD.     Trachea: No tracheal deviation.  Cardiovascular:     Rate and Rhythm: Normal rate and regular rhythm.     Heart sounds: Normal heart sounds. No murmur heard.    No friction rub. No gallop.  Pulmonary:     Effort: Pulmonary effort is normal. No respiratory distress.     Breath sounds: No stridor. No wheezing, rhonchi or rales.  Abdominal:     General: Bowel sounds are normal. There is no distension.     Palpations: Abdomen is soft. There is no mass.     Tenderness: There is no abdominal tenderness. There is no guarding or rebound.  Musculoskeletal:        General: No tenderness. Normal range of motion.     Cervical back: Normal range of motion and neck supple.  Lymphadenopathy:     Cervical: No cervical adenopathy.  Skin:    General: Skin is warm.     Coloration: Skin is not pale.     Findings: No erythema or rash.  Neurological:     Mental Status: He is alert and oriented to person, place, and time.     Cranial Nerves: No cranial nerve deficit.     Motor: No abnormal muscle tone.     Coordination: Coordination normal.     Deep Tendon Reflexes: Reflexes are normal and symmetric.  Psychiatric:        Behavior: Behavior normal.        Thought Content: Thought  content normal.        Judgment: Judgment normal.       Assessment & Plan:  Controlled type 2 diabetes mellitus with complication, without long-term current use of insulin - Plan: Hemoglobin A1c, Protein / Creatinine Ratio, Urine  Paroxysmal atrial fibrillation  Stage 3b chronic kidney disease  Chronic systolic congestive heart failure I reviewed his most recent BMP from April 8.  His creatinine was 1.8 and his GFR was greater than 35.  I will check an A1c today.  His A1c is less than 6.5 may want to consider stopping Tradjenta due to cost.  I will check a urine protein to creatinine ratio.  Blood pressure is well-controlled.  He is on maximum medical therapy for congestive heart failure given his underlying chronic kidney disease.  He is asymptomatic.  He is in normal sinus rhythm today and is appropriately anticoagulated.  We also discussed memory loss.  The patient has some mild  occasional memory loss but both he and his significant other deny any significant memory loss.

## 2022-11-25 LAB — HEMOGLOBIN A1C
Hgb A1c MFr Bld: 6.6 % of total Hgb — ABNORMAL HIGH (ref <5.7)
Mean Plasma Glucose: 143 mg/dL
eAG (mmol/L): 7.9 mmol/L

## 2022-11-25 LAB — PROTEIN / CREATININE RATIO, URINE
Creatinine, Urine: 157 mg/dL (ref 20–320)
Protein/Creat Ratio: 76 mg/g creat (ref 25–148)
Protein/Creatinine Ratio: 0.076 mg/mg creat (ref 0.025–0.148)
Total Protein, Urine: 12 mg/dL (ref 5–25)

## 2022-12-14 ENCOUNTER — Encounter: Payer: Self-pay | Admitting: Family Medicine

## 2022-12-14 ENCOUNTER — Telehealth: Payer: Self-pay | Admitting: Family Medicine

## 2022-12-14 NOTE — Telephone Encounter (Signed)
Patient dropped off Assisted Living Intake Form for provider to complete, sign, and fax to [facility], Fax number is [phone].  Patient refused to complete our intake paperwork now required for form completion; advised him there may be a fee for completing the forms. He requested to be billed; advised him the fee has to be paid before forms will be sent to [facility].  No prepayment received.  Forms placed on nurse's desk.

## 2023-01-08 ENCOUNTER — Ambulatory Visit: Payer: Medicare Other | Admitting: Family Medicine

## 2023-01-08 ENCOUNTER — Encounter: Payer: Self-pay | Admitting: Family Medicine

## 2023-01-08 VITALS — BP 114/64 | HR 49 | Temp 97.5°F | Ht 69.0 in | Wt 178.0 lb

## 2023-01-08 DIAGNOSIS — H9201 Otalgia, right ear: Secondary | ICD-10-CM

## 2023-01-08 MED ORDER — PREDNISONE 20 MG PO TABS: ORAL_TABLET | ORAL | 0 refills | Status: DC

## 2023-01-08 MED ORDER — NEOMYCIN-POLYMYXIN-HC 3.5-10000-1 OT SOLN: 3.0000 [drp] | Freq: Four times a day (QID) | OTIC | 0 refills | Status: DC

## 2023-01-08 NOTE — Progress Notes (Signed)
Subjective:    Patient ID: Antonio Clark, male    DOB: [birth year], 87 y.o.   MRN: [id number]  Symptoms began Wednesday.  Patient reports sharp sudden pain originating within his right ear that radiates up into his right temple.  The pain is intense and last 20 seconds and then spontaneously goes away.  This been going on for the last 2 days.  States that he stepped here developed.  There is no erythema.  There is no vesicles or blisters to suggest shingles visible on the outside.  On examination, the right auditory canal has a black impacted chunk of cerumen.  I can visualize 50% of the tympanic membrane that appears pearly gray with no erythema.  Posterior oropharynx is clear.  There is no lymphadenopathy in the neck.  The pain seems to come randomly and suddenly and sounds neuropathic in nature Past Medical History:  Diagnosis Date   Anal fissure    Arthritis    Atrial fibrillation (HCC)    Back pain    CHF (congestive heart failure) (HCC)    Colon polyps    Coronary artery disease    Diabetes mellitus type 2 with complications (HCC)    Dysrhythmia    a-fib   GERD (gastroesophageal reflux disease)    History of echocardiogram 07/ 07/ 2011   History of lithotripsy 1989   Hyperlipidemia    Hypertension    Kidney stones    Melanoma (HCC) 05/01/2019   right chest wall (12/20)   Prostate CA (HCC) 2010   Sleep apnea    uses CPAP nightly   Squamous cell carcinoma of skin 05/23/1992   bowens-left parietal scalp (CX35FU)   Squamous cell carcinoma of skin 03/14/2008   in situ-left upper outer forehead-medial (CX35FU)   Squamous cell carcinoma of skin 03/14/2008   in situ-crown of scalp (Cx35FU)   Squamous cell carcinoma of skin 04/15/2011   in situ-right dorsal forearm (txpbx)   Squamous cell carcinoma of skin 10/16/2011   in situ-left sideburn   Squamous cell carcinoma of skin 03/11/2015   ka-left sideburn (CX35FU)   Squamous cell carcinoma of skin 03/11/2015   ka-left forearm  (CX35FU)   Squamous cell carcinoma of skin 06/22/2018   in situ-left forearm, sup (txpbx)   Squamous cell carcinoma of skin 05/01/2019   in situ-mid anterior scalp    Squamous cell carcinoma of skin 05/01/2019   in situ-right upper arm   Past Surgical History:  Procedure Laterality Date   ATRIAL FIBRILLATION ABLATION N/A 04/17/2020   Procedure: ATRIAL FIBRILLATION ABLATION;  Surgeon: Lanier Prude, MD;  Location: [facility];  Service: Cardiovascular;  Laterality: N/A;   AXILLARY SENTINEL NODE BIOPSY Right 08/23/2019   Procedure: SENTINEL LYMPH NODE BIOSPY RIGHT AXILLA;  Surgeon: Almond Lint, MD;  Location: [facility];  Service: General;  Laterality: Right;   CARDIOVERSION N/A 05/10/2020   Procedure: CARDIOVERSION;  Surgeon: Meriam Sprague, MD;  Location: [facility] ENDOSCOPY;  Service: Cardiovascular;  Laterality: N/A;   CARDIOVERSION N/A 05/08/2021   Procedure: CARDIOVERSION;  Surgeon: Little Ishikawa, MD;  Location: [facility] ENDOSCOPY;  Service: Cardiovascular;  Laterality: N/A;   CARDIOVERSION N/A 02/17/2022   Procedure: CARDIOVERSION;  Surgeon: Jake Bathe, MD;  Location: [facility] ENDOSCOPY;  Service: Cardiovascular;  Laterality: N/A;   COLONOSCOPY  2002   CORONARY ANGIOGRAPHY N/A 02/13/2022   Procedure: CORONARY ANGIOGRAPHY;  Surgeon: Tonny Bollman, MD;  Location: [facility] INVASIVE CV LAB;  Service: Cardiovascular;  Laterality: N/A;  CORONARY ULTRASOUND/IVUS N/A 02/13/2022   Procedure: Intravascular Ultrasound/IVUS;  Surgeon: Tonny Bollman, MD;  Location: [facility] INVASIVE CV LAB;  Service: Cardiovascular;  Laterality: N/A;   FASCIECTOMY Left 03/28/2019   Procedure: SEGMENTAL FASCIECTOMY LEFT RING FINGER;  Surgeon: Cindee Salt, MD;  Location: [facility];  Service: Orthopedics;  Laterality: Left;  ANESTHESIA  AXILLARY BLOCK   INSERTION PROSTATE RADIATION SEED  8 4 2010   per Dr Earlene Plater   MELANOMA EXCISION Right 06/22/2019   Procedure: WIDE LOCAL EXCISION RIGHT  CHEST WALL MELANOMA, ADVANCEMENT FLAP CLOSURE FOR DEFECT 3X6 CM;  Surgeon: Almond Lint, MD;  Location: [facility];  Service: General;  Laterality: Right;   POLYPECTOMY  2002   RIGHT/LEFT HEART CATH AND CORONARY ANGIOGRAPHY N/A 02/11/2022   Procedure: RIGHT/LEFT HEART CATH AND CORONARY ANGIOGRAPHY;  Surgeon: Yvonne Kendall, MD;  Location: [facility] INVASIVE CV LAB;  Service: Cardiovascular;  Laterality: N/A;   TEE WITHOUT CARDIOVERSION N/A 02/17/2022   Procedure: TRANSESOPHAGEAL ECHOCARDIOGRAM (TEE);  Surgeon: Jake Bathe, MD;  Location: [facility] ENDOSCOPY;  Service: Cardiovascular;  Laterality: N/A;   THROAT SURGERY  1980   benign cyst   UPPER GASTROINTESTINAL ENDOSCOPY     URETHRAL STRICTURE DILATATION     also penile implant   Current Outpatient Medications on File Prior to Visit  Medication Sig Dispense Refill   acetaminophen (TYLENOL) 325 MG tablet Take 2 tablets (650 mg total) by mouth every 6 (six) hours as needed for mild pain (or Fever >/= 101).     amiodarone (PACERONE) 200 MG tablet Take 0.5 tablets (100 mg total) by mouth daily. 45 tablet 3   atorvastatin (LIPITOR) 80 MG tablet Take 1 tablet (80 mg total) by mouth daily. 90 tablet 3   ELIQUIS 2.5 MG TABS tablet TAKE 1 TABLET BY MOUTH TWICE A DAY 180 tablet 1   empagliflozin (JARDIANCE) 10 MG TABS tablet Take 1 tablet (10 mg total) by mouth daily before breakfast. 90 tablet 3   finasteride (PROSCAR) 5 MG tablet Take 5 mg by mouth daily.     furosemide (LASIX) 40 MG tablet Take 0.5 tablets (20 mg total) by mouth as needed (For weight gain of 3 lbs in 24 hours or 5 lbs in a week). 30 tablet 8   linagliptin (TRADJENTA) 5 MG TABS tablet Take 1 tablet (5 mg total) by mouth daily. 90 tablet 0   LORazepam (ATIVAN) 0.5 MG tablet TAKE 1 TABLET BY MOUTH TWICE A DAY AS NEEDED FOR ANXIETY 30 tablet 1   metoprolol succinate (TOPROL-XL) 50 MG 24 hr tablet Take 0.5 tablets (25 mg total) by mouth at bedtime. Take with or immediately  following a meal. 30 tablet 5   nitroGLYCERIN (NITROSTAT) 0.4 MG SL tablet Place 1 tablet (0.4 mg total) under the tongue every 5 (five) minutes x 3 doses as needed for chest pain. 25 tablet 12   oxymetazoline (AFRIN) 0.05 % nasal spray Place 1 spray into both nostrils as needed for congestion.     spironolactone (ALDACTONE) 25 MG tablet TAKE 1 TABLET BY MOUTH EVERYDAY AT BEDTIME (Patient taking differently: 12.5 mg daily.) 90 tablet 1   traZODone (DESYREL) 50 MG tablet TAKE 0.5 TABLETS BY MOUTH AT BEDTIME AS NEEDED FOR SLEEP. 45 tablet 3   No current facility-administered medications on file prior to visit.   No Known Allergies Social History   Socioeconomic History   Marital status: Single    Spouse name: Not on file   Number of children: 0  Years of education: Not on file   Highest education level: Not on file  Occupational History   Occupation: retired  Tobacco Use   Smoking status: Never   Smokeless tobacco: Never  Vaping Use   Vaping Use: Never used  Substance and Sexual Activity   Alcohol use: Not Currently    Comment: social   Drug use: No   Sexual activity: Yes  Other Topics Concern   Not on file  Social History Narrative   Not on file   Social Determinants of Health   Financial Resource Strain: Low Risk  (11/05/2022)   Overall Financial Resource Strain (CARDIA)    Difficulty of Paying Living Expenses: Not hard at all  Food Insecurity: No Food Insecurity (11/05/2022)   Hunger Vital Sign    Worried About Running Out of Food in the Last Year: Never true    Ran Out of Food in the Last Year: Never true  Transportation Needs: No Transportation Needs (11/05/2022)   PRAPARE - Administrator, Civil Service (Medical): No    Lack of Transportation (Non-Medical): No  Physical Activity: Insufficiently Active (11/05/2022)   Exercise Vital Sign    Days of Exercise per Week: 3 days    Minutes of Exercise per Session: 30 min  Stress: No Stress Concern Present  (11/05/2022)   [employer] of Occupational Health - Occupational Stress Questionnaire    Feeling of Stress : Not at all  Social Connections: Moderately Integrated (11/05/2022)   Social Connection and Isolation Panel [NHANES]    Frequency of Communication with Friends and Family: More than three times a week    Frequency of Social Gatherings with Friends and Family: More than three times a week    Attends Religious Services: More than 4 times per year    Active Member of [employer] or Organizations: Yes    Attends Engineer, structural: More than 4 times per year    Marital Status: Never married  Intimate Partner Violence: Not At Risk (11/05/2022)   Humiliation, Afraid, Rape, and Kick questionnaire    Fear of Current or Ex-Partner: No    Emotionally Abused: No    Physically Abused: No    Sexually Abused: No   Family History  Problem Relation Age of Onset   Coronary artery disease Brother        3 brothers had CABG   Arrhythmia Brother    Diabetes Brother    Hearing loss Brother    Hypertension Brother    Heart disease Brother    Heart failure Brother    Hypertension Brother    Heart disease Father    Hypertension Father    Sudden death Father    Stroke Mother        cerebral hemorrage   Colon cancer Mother 79       small intestine   Depression Mother    Stroke Sister    Suicidality Sister    Diabetes Brother    Hypertension Brother    Hyperlipidemia Sister    Esophageal cancer Neg Hx    Pancreatic cancer Neg Hx    Prostate cancer Neg Hx    Rectal cancer Neg Hx    Stomach cancer Neg Hx      Review of Systems  All other systems reviewed and are negative.      Objective:   Physical Exam Vitals reviewed.  Constitutional:      General: He is not in acute distress.  Appearance: He is well-developed. He is not diaphoretic.  HENT:     Head: Normocephalic and atraumatic.     Salivary Glands: Right salivary gland is not diffusely enlarged or tender. Left  salivary gland is not diffusely enlarged or tender.     Right Ear: Tympanic membrane, ear canal and external ear normal. No decreased hearing noted. Tenderness present. No drainage or swelling. There is impacted cerumen. No foreign body. No hemotympanum. Tympanic membrane is not injected or scarred.     Left Ear: Tympanic membrane, ear canal and external ear normal. No tenderness. There is no impacted cerumen.     Nose: Nose normal.     Mouth/Throat:     Pharynx: No oropharyngeal exudate.  Eyes:     General: No scleral icterus.       Right eye: No discharge.        Left eye: No discharge.     Conjunctiva/sclera: Conjunctivae normal.     Pupils: Pupils are equal, round, and reactive to light.  Neck:     Thyroid: No thyromegaly.     Vascular: No JVD.     Trachea: No tracheal deviation.  Cardiovascular:     Rate and Rhythm: Normal rate and regular rhythm.     Heart sounds: Normal heart sounds. No murmur heard.    No friction rub. No gallop.  Pulmonary:     Effort: Pulmonary effort is normal. No respiratory distress.     Breath sounds: No stridor. No wheezing, rhonchi or rales.  Musculoskeletal:     Cervical back: Normal range of motion and neck supple.  Lymphadenopathy:     Cervical: No cervical adenopathy.  Skin:    General: Skin is warm.     Coloration: Skin is not pale.     Findings: No erythema or rash.  Neurological:     General: No focal deficit present.     Mental Status: He is alert and oriented to person, place, and time.     Motor: No abnormal muscle tone.     Deep Tendon Reflexes: Reflexes are normal and symmetric.       Assessment & Plan:  Otalgia of right ear After removing the wax from his right auditory canal, I can see some mild erythema in the floor of the canal and some slight exudate suggesting possible mild otitis externa.  I will treat this with Cortisporin HC otic.  However I do not feel that this is causing shooting nervelike pain radiating from his ear  up into his right temple.  The differential diagnosis for this would be cervical radiculopathy, trigeminal neuralgia, or less likely temporal arteritis.  I will start the patient on a prednisone taper pack through the weekend.  I suspect it is nerve in his upper neck is the most likely cause.  The pain is too quick to sleep I believe the suggestive arteritis.  trigeminal neuralgia is also different diagnosis.  Await to see his response to the prednisone

## 2023-01-21 ENCOUNTER — Other Ambulatory Visit: Payer: Self-pay

## 2023-01-21 ENCOUNTER — Other Ambulatory Visit: Payer: Self-pay | Admitting: Nurse Practitioner

## 2023-01-21 DIAGNOSIS — E119 Type 2 diabetes mellitus without complications: Secondary | ICD-10-CM

## 2023-01-21 MED ORDER — LINAGLIPTIN 5 MG PO TABS
5.0000 mg | ORAL_TABLET | Freq: Every day | ORAL | 1 refills | Status: AC
Start: 2023-01-21 — End: ?

## 2023-01-21 MED ORDER — EMPAGLIFLOZIN 10 MG PO TABS: 10.0000 mg | ORAL_TABLET | Freq: Every day | ORAL | 1 refills | Status: DC

## 2023-01-21 NOTE — Telephone Encounter (Signed)
Requested Prescriptions  Pending Prescriptions Disp Refills   linagliptin (TRADJENTA) 5 MG TABS tablet 90 tablet 1    Sig: Take 1 tablet (5 mg total) by mouth daily.     Endocrinology:  Diabetes - DPP-4 Inhibitors - linagliptin Failed - 01/21/2023 12:02 PM      Failed - Valid encounter within last 6 months    Recent Outpatient Visits           1 year ago TMJ (temporomandibular joint syndrome)   [facility] Donita Brooks, MD   1 year ago Pleurisy   [facility] Donita Brooks, MD   1 year ago Wheezing   [facility] Tanya Nones, Priscille Heidelberg, MD   1 year ago Balance problem   [facility] Valentino Nose, NP   2 years ago Persistent atrial fibrillation (HCC)   [facility] Pickard, Priscille Heidelberg, MD              Passed - HBA1C is between 0 and 7.9 and within 180 days    Hgb A1c MFr Bld  Date Value Ref Range Status  11/24/2022 6.6 (H) <5.7 % of total Hgb Final    Comment:    For someone without known diabetes, a hemoglobin A1c value of 6.5% or greater indicates that they may have  diabetes and this should be confirmed with a follow-up  test. . For someone with known diabetes, a value <7% indicates  that their diabetes is well controlled and a value  greater than or equal to 7% indicates suboptimal  control. A1c targets should be individualized based on  duration of diabetes, age, comorbid conditions, and  other considerations. . Currently, no consensus exists regarding use of hemoglobin A1c for diagnosis of diabetes for children. Marland Kitchen

## 2023-01-21 NOTE — Telephone Encounter (Signed)
Prescription Request  01/21/2023  LOV: 01/08/23  What is the name of the medication or equipment? linagliptin (TRADJENTA) 5 MG TABS tablet  Have you contacted your pharmacy to request a refill? Yes   Which pharmacy would you like this sent to?  [id number] Judithann Sheen,  - [address] [place] [address] Phone: [phone] Fax: [phone]    Patient notified that their request is being sent to the clinical staff for review and that they should receive a response within 2 business days.   Please advise at [facility] [phone]

## 2023-02-19 ENCOUNTER — Other Ambulatory Visit: Payer: Self-pay | Admitting: Cardiology

## 2023-02-19 DIAGNOSIS — I480 Paroxysmal atrial fibrillation: Secondary | ICD-10-CM

## 2023-02-19 NOTE — Telephone Encounter (Signed)
Prescription refill request for Eliquis received. Indication:afib Last office visit:9/23 Scr:1.85  4/24 Age: 87 Weight:80.7  kg  Prescription refilled

## 2023-04-05 ENCOUNTER — Other Ambulatory Visit: Payer: Self-pay | Admitting: Family Medicine

## 2023-04-05 ENCOUNTER — Other Ambulatory Visit (HOSPITAL_COMMUNITY): Payer: Self-pay | Admitting: Cardiology

## 2023-04-06 NOTE — Telephone Encounter (Signed)
Requested medication (s) are due for refill today: yes  Requested medication (s) are on the active medication list: yes  Last refill:  08/27/22  Future visit scheduled: no  Notes to clinic:  Unable to refill per protocol, cannot delegate.      Requested Prescriptions  Pending Prescriptions Disp Refills   LORazepam (ATIVAN) 0.5 MG tablet [Pharmacy Med Name: LORAZEPAM 0.5 MG TABLET] 30 tablet     Sig: TAKE 1 TABLET BY MOUTH TWICE A DAY AS NEEDED FOR ANXIETY     Not Delegated - Psychiatry: Anxiolytics/Hypnotics 2 Failed - 04/05/2023 10:53 AM      Failed - This refill cannot be delegated      Failed - Urine Drug Screen completed in last 360 days      Failed - Valid encounter within last 6 months    Recent Outpatient Visits           1 year ago TMJ (temporomandibular joint syndrome)   [facility] Donita Brooks, MD   1 year ago Pleurisy   [facility] Donita Brooks, MD   1 year ago Wheezing   [facility] Tanya Nones, Priscille Heidelberg, MD   2 years ago Balance problem   [facility] Valentino Nose, NP   2 years ago Persistent atrial fibrillation [facility])   [facility] Pickard, Priscille Heidelberg, MD              Passed - Patient is not pregnant

## 2023-05-12 ENCOUNTER — Other Ambulatory Visit (HOSPITAL_COMMUNITY): Payer: Self-pay | Admitting: Cardiology

## 2023-05-25 ENCOUNTER — Other Ambulatory Visit: Payer: Self-pay | Admitting: Urology

## 2023-06-01 NOTE — Progress Notes (Signed)
COVID Vaccine Completed: yes  Date of COVID positive in last 90 days:  PCP - Lynnea Ferrier, MD Cardiologist - Dr. Shirlee Latch  Chest x-ray -  EKG - 11/16/22 Epic Stress Test - 2013 ECHO - 07/22/22 Epic Cardiac Cath - 02/11/22 Epic Pacemaker/ICD device last checked: Spinal Cord Stimulator:  Bowel Prep -   Sleep Study -  CPAP -   Fasting Blood Sugar -  Checks Blood Sugar _____ times a day  Last dose of GLP1 agonist-  N/A GLP1 instructions:  N/A   Last dose of SGLT-2 inhibitors-  N/A SGLT-2 instructions: N/A   Blood Thinner Instructions:  Eliquis Aspirin Instructions: Last Dose:  Activity level:  Can go up a flight of stairs and perform activities of daily living without stopping and without symptoms of chest pain or shortness of breath.  Able to exercise without symptoms  Unable to go up a flight of stairs without symptoms of     Anesthesia review: DM2, CHF, HFrEF, HTN, DM2  Patient denies shortness of breath, fever, cough and chest pain at PAT appointment  Patient verbalized understanding of instructions that were given to them at the PAT appointment. Patient was also instructed that they will need to review over the PAT instructions again at home before surgery.

## 2023-06-02 ENCOUNTER — Other Ambulatory Visit: Payer: Self-pay

## 2023-06-02 ENCOUNTER — Encounter (HOSPITAL_COMMUNITY)
Admission: RE | Admit: 2023-06-02 | Discharge: 2023-06-02 | Disposition: A | Discharge status: Home / Self Care | Payer: Medicare Other | Source: Ambulatory Visit | Attending: Urology | Admitting: Urology

## 2023-06-02 ENCOUNTER — Emergency Department (HOSPITAL_COMMUNITY): Payer: Medicare Other

## 2023-06-02 ENCOUNTER — Emergency Department (HOSPITAL_COMMUNITY)
Admission: EM | Admit: 2023-06-02 | Discharge: 2023-06-02 | Disposition: A | Discharge status: Home / Self Care | Payer: Medicare Other | Attending: Emergency Medicine | Admitting: Emergency Medicine

## 2023-06-02 ENCOUNTER — Encounter (HOSPITAL_COMMUNITY): Payer: Self-pay

## 2023-06-02 VITALS — BP 110/63 | HR 70 | Temp 98.0°F | Resp 16 | Ht 70.0 in | Wt 168.0 lb

## 2023-06-02 DIAGNOSIS — I2510 Atherosclerotic heart disease of native coronary artery without angina pectoris: Secondary | ICD-10-CM | POA: Diagnosis not present

## 2023-06-02 DIAGNOSIS — G4730 Sleep apnea, unspecified: Secondary | ICD-10-CM | POA: Insufficient documentation

## 2023-06-02 DIAGNOSIS — R3912 Poor urinary stream: Secondary | ICD-10-CM | POA: Insufficient documentation

## 2023-06-02 DIAGNOSIS — Z01812 Encounter for preprocedural laboratory examination: Secondary | ICD-10-CM | POA: Insufficient documentation

## 2023-06-02 DIAGNOSIS — N189 Chronic kidney disease, unspecified: Secondary | ICD-10-CM | POA: Diagnosis not present

## 2023-06-02 DIAGNOSIS — E1122 Type 2 diabetes mellitus with diabetic chronic kidney disease: Secondary | ICD-10-CM | POA: Insufficient documentation

## 2023-06-02 DIAGNOSIS — E118 Type 2 diabetes mellitus with unspecified complications: Secondary | ICD-10-CM | POA: Insufficient documentation

## 2023-06-02 DIAGNOSIS — Z7901 Long term (current) use of anticoagulants: Secondary | ICD-10-CM | POA: Diagnosis not present

## 2023-06-02 DIAGNOSIS — E875 Hyperkalemia: Secondary | ICD-10-CM | POA: Insufficient documentation

## 2023-06-02 DIAGNOSIS — I4891 Unspecified atrial fibrillation: Secondary | ICD-10-CM | POA: Insufficient documentation

## 2023-06-02 DIAGNOSIS — I130 Hypertensive heart and chronic kidney disease with heart failure and stage 1 through stage 4 chronic kidney disease, or unspecified chronic kidney disease: Secondary | ICD-10-CM | POA: Insufficient documentation

## 2023-06-02 DIAGNOSIS — I110 Hypertensive heart disease with heart failure: Secondary | ICD-10-CM | POA: Insufficient documentation

## 2023-06-02 DIAGNOSIS — I5022 Chronic systolic (congestive) heart failure: Secondary | ICD-10-CM | POA: Insufficient documentation

## 2023-06-02 DIAGNOSIS — R799 Abnormal finding of blood chemistry, unspecified: Secondary | ICD-10-CM | POA: Diagnosis present

## 2023-06-02 HISTORY — DX: Personal history of urinary calculi: Z87.442

## 2023-06-02 LAB — HEMOGLOBIN A1C
Hgb A1c MFr Bld: 6 % — ABNORMAL HIGH (ref 4.8–5.6)
Mean Plasma Glucose: 125.5 mg/dL

## 2023-06-02 LAB — CBC
HCT: 40.6 % (ref 39.0–52.0)
HCT: 41.9 % (ref 39.0–52.0)
Hemoglobin: 12.9 g/dL — ABNORMAL LOW (ref 13.0–17.0)
Hemoglobin: 13.6 g/dL (ref 13.0–17.0)
MCH: 30.7 pg (ref 26.0–34.0)
MCH: 31.2 pg (ref 26.0–34.0)
MCHC: 31.8 g/dL (ref 30.0–36.0)
MCHC: 32.5 g/dL (ref 30.0–36.0)
MCV: 96.7 fL (ref 80.0–100.0)
MCV: 96.1 fL (ref 80.0–100.0)
Platelets: 125 10*3/uL — ABNORMAL LOW (ref 150–400)
Platelets: 142 10*3/uL — ABNORMAL LOW (ref 150–400)
RBC: 4.2 MIL/uL — ABNORMAL LOW (ref 4.22–5.81)
RBC: 4.36 MIL/uL (ref 4.22–5.81)
RDW: 13.7 % (ref 11.5–15.5)
RDW: 13.9 % (ref 11.5–15.5)
WBC: 5.6 10*3/uL (ref 4.0–10.5)
WBC: 6.3 10*3/uL (ref 4.0–10.5)
nRBC: 0 % (ref 0.0–0.2)
nRBC: 0 % (ref 0.0–0.2)

## 2023-06-02 LAB — BASIC METABOLIC PANEL
Anion gap: 7 (ref 5–15)
Anion gap: 9 (ref 5–15)
BUN: 49 mg/dL — ABNORMAL HIGH (ref 8–23)
BUN: 49 mg/dL — ABNORMAL HIGH (ref 8–23)
CO2: 22 mmol/L (ref 22–32)
CO2: 23 mmol/L (ref 22–32)
Calcium: 9.3 mg/dL (ref 8.9–10.3)
Calcium: 9.6 mg/dL (ref 8.9–10.3)
Chloride: 107 mmol/L (ref 98–111)
Chloride: 104 mmol/L (ref 98–111)
Creatinine, Ser: 2.22 mg/dL — ABNORMAL HIGH (ref 0.61–1.24)
Creatinine, Ser: 2.24 mg/dL — ABNORMAL HIGH (ref 0.61–1.24)
GFR, Estimated: 28 mL/min — ABNORMAL LOW (ref >60)
GFR, Estimated: 28 mL/min — ABNORMAL LOW (ref >60)
Glucose, Bld: 181 mg/dL — ABNORMAL HIGH (ref 70–99)
Glucose, Bld: 164 mg/dL — ABNORMAL HIGH (ref 70–99)
Potassium: 6.5 mmol/L (ref 3.5–5.1)
Potassium: 5.6 mmol/L — ABNORMAL HIGH (ref 3.5–5.1)
Sodium: 136 mmol/L (ref 135–145)
Sodium: 136 mmol/L (ref 135–145)

## 2023-06-02 LAB — GLUCOSE, CAPILLARY: Glucose-Capillary: 212 mg/dL — ABNORMAL HIGH (ref 70–99)

## 2023-06-02 LAB — POTASSIUM: Potassium: 4.9 mmol/L (ref 3.5–5.1)

## 2023-06-02 MED ORDER — SODIUM CHLORIDE 0.9 % IV BOLUS
500.0000 mL | Freq: Once | INTRAVENOUS | Status: AC
  Administered 2023-06-02: 500 mL via INTRAVENOUS

## 2023-06-02 MED ORDER — LOKELMA 5 G PO PACK: 5.0000 g | PACK | Freq: Every day | ORAL | 0 refills | Status: AC

## 2023-06-02 MED ORDER — ALBUTEROL SULFATE (2.5 MG/3ML) 0.083% IN NEBU
10.0000 mg | INHALATION_SOLUTION | Freq: Once | RESPIRATORY_TRACT | Status: AC
  Administered 2023-06-02: 10 mg via RESPIRATORY_TRACT
  Filled 2023-06-02: qty 12

## 2023-06-02 MED ORDER — SODIUM ZIRCONIUM CYCLOSILICATE 10 G PO PACK
10.0000 g | PACK | Freq: Once | ORAL | Status: AC
  Administered 2023-06-02: 10 g via ORAL
  Filled 2023-06-02: qty 1

## 2023-06-02 MED ORDER — SODIUM CHLORIDE 0.9 % IV BOLUS: 1000.0000 mL | Freq: Once | INTRAVENOUS | Status: DC

## 2023-06-02 NOTE — Patient Instructions (Addendum)
SURGICAL WAITING ROOM VISITATION  Patients having surgery or a procedure may have no more than 2 support people in the waiting area - these visitors may rotate.    Children under the age of 84 must have an adult with them who is not the patient.  Due to an increase in RSV and influenza rates and associated hospitalizations, children ages 56 and under may not visit patients in [facility] hospitals.  If the patient needs to stay at the hospital during part of their recovery, the visitor guidelines for inpatient rooms apply. Pre-op nurse will coordinate an appropriate time for 1 support person to accompany patient in pre-op.  This support person may not rotate.    Please refer to the [facility] website for the visitor guidelines for Inpatients (after your surgery is over and you are in a regular room).    Your procedure is scheduled on: 06/08/23   Report to [facility] Main Entrance    Report to admitting at 11:15 AM   Call this number if you have problems the morning of surgery [phone]   Do not eat food or drink liquids :After Midnight.          If you have questions, please contact your surgeon's office.   FOLLOW BOWEL PREP AND ANY ADDITIONAL PRE OP INSTRUCTIONS YOU RECEIVED FROM YOUR SURGEON'S OFFICE!!!     Oral Hygiene is also important to reduce your risk of infection.                                    Remember - BRUSH YOUR TEETH THE MORNING OF SURGERY WITH YOUR REGULAR TOOTHPASTE  DENTURES WILL BE REMOVED PRIOR TO SURGERY PLEASE DO NOT APPLY "Poly grip" OR ADHESIVES!!!   Stop all vitamins and herbal supplements 7 days before surgery.   Take these medicines the morning of surgery with A SIP OF WATER: Tylenol, Amiodarone, Atorvastatin, Finasteride, Ativan, Metoprolol   DO NOT TAKE ANY ORAL DIABETIC MEDICATIONS DAY OF YOUR SURGERY  How to Manage Your Diabetes Before and After Surgery  Why is it important to control my blood sugar before and after  surgery? Improving blood sugar levels before and after surgery helps healing and can limit problems. A way of improving blood sugar control is eating a healthy diet by:  Eating less sugar and carbohydrates  Increasing activity/exercise  Talking with your doctor about reaching your blood sugar goals High blood sugars (greater than 180 mg/dL) can raise your risk of infections and slow your recovery, so you will need to focus on controlling your diabetes during the weeks before surgery. Make sure that the doctor who takes care of your diabetes knows about your planned surgery including the date and location.  How do I manage my blood sugar before surgery? Check your blood sugar at least 4 times a day, starting 2 days before surgery, to make sure that the level is not too high or low. Check your blood sugar the morning of your surgery when you wake up and every 2 hours until you get to the Short Stay unit. If your blood sugar is less than 70 mg/dL, you will need to treat for low blood sugar: Do not take insulin. Treat a low blood sugar (less than 70 mg/dL) with  cup of clear juice (cranberry or apple), 4 glucose tablets, OR glucose gel. Recheck blood sugar in 15 minutes after treatment (to make  sure it is greater than 70 mg/dL). If your blood sugar is not greater than 70 mg/dL on recheck, call [phone] for further instructions. Report your blood sugar to the short stay nurse when you get to Short Stay.  If you are admitted to the hospital after surgery: Your blood sugar will be checked by the staff and you will probably be given insulin after surgery (instead of oral diabetes medicines) to make sure you have good blood sugar levels. The goal for blood sugar control after surgery is 80-180 mg/dL.   WHAT DO I DO ABOUT MY DIABETES MEDICATION?  Do not take oral diabetes medicines (pills) the morning of surgery.  Hold Jardiance for 3 days. Last dose 06/04/23.  THE DAY BEFORE SURGERY, take  Trajenta as prescribed.      THE MORNING OF SURGERY, do not take Trajenta.   Reviewed and Endorsed by [facility] Patient Education Committee, August 2015                              You may not have any metal on your body including jewelry, and body piercing             Do not wear lotions, powders, cologne, or deodorant              Men may shave face and neck.   Do not bring valuables to the hospital. [facility] IS NOT             RESPONSIBLE   FOR VALUABLES.   Contacts, glasses, dentures or bridgework may not be worn into surgery.   DO NOT BRING YOUR HOME MEDICATIONS TO THE HOSPITAL. PHARMACY WILL DISPENSE MEDICATIONS LISTED ON YOUR MEDICATION LIST TO YOU DURING YOUR ADMISSION IN THE HOSPITAL!    Patients discharged on the day of surgery will not be allowed to drive home.  Someone NEEDS to stay with you for the first 24 hours after anesthesia.   Special Instructions: Bring a copy of your healthcare power of attorney and living will documents the day of surgery if you haven't scanned them before.              Please read over the following fact sheets you were given: IF YOU HAVE QUESTIONS ABOUT YOUR PRE-OP INSTRUCTIONS PLEASE CALL [phone] [place]ras    If you received a COVID test during your pre-op visit  it is requested that you wear a mask when out in public, stay away from anyone that may not be feeling well and notify your surgeon if you develop symptoms. If you test positive for Covid or have been in contact with anyone that has tested positive in the last 10 days please notify you surgeon.    [facility] - Preparing for Surgery Before surgery, you can play an important role.  Because skin is not sterile, your skin needs to be as free of germs as possible.  You can reduce the number of germs on your skin by washing with CHG (chlorahexidine gluconate) soap before surgery.  CHG is an antiseptic cleaner which kills germs and bonds with the skin to continue killing germs even  after washing. Please DO NOT use if you have an allergy to CHG or antibacterial soaps.  If your skin becomes reddened/irritated stop using the CHG and inform your nurse when you arrive at Short Stay. Do not shave (including legs and underarms) for at least 48 hours prior to the  first CHG shower.  You may shave your face/neck.  Please follow these instructions carefully:  1.  Shower with CHG Soap the night before surgery and the  morning of surgery.  2.  If you choose to wash your hair, wash your hair first as usual with your normal  shampoo.  3.  After you shampoo, rinse your hair and body thoroughly to remove the shampoo.                             4.  Use CHG as you would any other liquid soap.  You can apply chg directly to the skin and wash.  Gently with a scrungie or clean washcloth.  5.  Apply the CHG Soap to your body ONLY FROM THE NECK DOWN.   Do   not use on face/ open                           Wound or open sores. Avoid contact with eyes, ears mouth and   genitals (private parts).                       Wash face,  Genitals (private parts) with your normal soap.             6.  Wash thoroughly, paying special attention to the area where your    surgery  will be performed.  7.  Thoroughly rinse your body with warm water from the neck down.  8.  DO NOT shower/wash with your normal soap after using and rinsing off the CHG Soap.                9.  Pat yourself dry with a clean towel.            10.  Wear clean pajamas.            11.  Place clean sheets on your bed the night of your first shower and do not  sleep with pets. Day of Surgery : Do not apply any lotions/deodorants the morning of surgery.  Please wear clean clothes to the hospital/surgery center.  FAILURE TO FOLLOW THESE INSTRUCTIONS MAY RESULT IN THE CANCELLATION OF YOUR SURGERY  PATIENT SIGNATURE_________________________________  NURSE  SIGNATURE__________________________________  ________________________________________________________________________

## 2023-06-02 NOTE — ED Triage Notes (Signed)
Patient states he had blood drawn for the urologist and they informed him his potassium was elevated at 6.5. Patient has a history of Afib. Patient takes eliquis. Denies chest discomfort or muscle cramps.

## 2023-06-02 NOTE — ED Provider Notes (Signed)
[place] EMERGENCY DEPARTMENT AT [facility] Provider Note   CSN: [id number] Arrival date & time: 06/02/23  1859     History  Chief Complaint  Patient presents with   Abnormal Labs    Antonio Clark is a 87 y.o. male presenting to ED with concern for elevated K 6.5 on labs today.  Hx of CKD.  Patient reports he tries to keep himself well-hydrated with water.  He denies prior history of hyperkalemia.  He reports he feels asymptomatic and at baseline.  HPI     Home Medications Prior to Admission medications   Medication Sig Start Date End Date Taking? Authorizing Provider  sodium zirconium cyclosilicate (LOKELMA) 5 g packet Take 5 g by mouth daily for 3 doses. 06/02/23 06/05/23 Yes Sherwood Castilla, Kermit Balo, MD  acetaminophen (TYLENOL) 325 MG tablet Take 2 tablets (650 mg total) by mouth every 6 (six) hours as needed for mild pain (or Fever >/= 101). 02/11/22   Sunnie Nielsen, DO  amiodarone (PACERONE) 200 MG tablet TAKE 1/2 TABLET BY MOUTH DAILY 04/07/23   Laurey Morale, MD  atorvastatin (LIPITOR) 80 MG tablet TAKE 1 TABLET BY MOUTH EVERY DAY 04/07/23   Laurey Morale, MD  ELIQUIS 2.5 MG TABS tablet TAKE 1 TABLET BY MOUTH TWICE A DAY 02/19/23   Jake Bathe, MD  empagliflozin (JARDIANCE) 10 MG TABS tablet Take 1 tablet (10 mg total) by mouth daily before breakfast. 01/21/23   Jake Bathe, MD  finasteride (PROSCAR) 5 MG tablet Take 5 mg by mouth daily. 08/22/21   [provider]  furosemide (LASIX) 40 MG tablet Take 0.5 tablets (20 mg total) by mouth as needed (For weight gain of 3 lbs in 24 hours or 5 lbs in a week). 11/16/22   Mil[place], Anderson Malta, FNP  linagliptin (TRADJENTA) 5 MG TABS tablet Take 1 tablet (5 mg total) by mouth daily. 01/21/23   Donita Brooks, MD  LORazepam (ATIVAN) 0.5 MG tablet TAKE 1 TABLET BY MOUTH TWICE A DAY AS NEEDED FOR ANXIETY 04/09/23   Donita Brooks, MD  metoprolol succinate (TOPROL-XL) 50 MG 24 hr tablet TAKE 1/2 TABLET (25 MG  TOTAL) BY MOUTH AT BEDTIME. TAKE WITH OR IMMEDIATELY FOLLOWING A MEAL. 05/13/23   Laurey Morale, MD  neomycin-polymyxin-hydrocortisone (CORTISPORIN) OTIC solution Place 3 drops into the right ear 4 (four) times daily. Patient not taking: Reported on 05/26/2023 01/08/23   Donita Brooks, MD  nitroGLYCERIN (NITROSTAT) 0.4 MG SL tablet Place 1 tablet (0.4 mg total) under the tongue every 5 (five) minutes x 3 doses as needed for chest pain. 02/24/22   Jonita Albee, PA-C  oxymetazoline (AFRIN) 0.05 % nasal spray Place 1 spray into both nostrils as needed for congestion.    [provider]  spironolactone (ALDACTONE) 25 MG tablet TAKE 1 TABLET BY MOUTH EVERYDAY AT BEDTIME 11/09/22   Laurey Morale, MD  traZODone (DESYREL) 50 MG tablet TAKE 0.5 TABLETS BY MOUTH AT BEDTIME AS NEEDED FOR SLEEP. 06/23/22   Swinyer, Zachary George, NP      Allergies    Patient has no known allergies.    Review of Systems   Review of Systems  Physical Exam Updated Vital Signs BP 109/66 (BP Location: Left Arm)   Pulse 68   Temp 97.8 F (36.6 C) (Oral)   Resp 16   SpO2 99%  Physical Exam Constitutional:      General: He is not in acute distress. HENT:  Head: Normocephalic and atraumatic.  Eyes:     Conjunctiva/sclera: Conjunctivae normal.     Pupils: Pupils are equal, round, and reactive to light.  Cardiovascular:     Rate and Rhythm: Normal rate and regular rhythm.  Pulmonary:     Effort: Pulmonary effort is normal. No respiratory distress.  Abdominal:     General: There is no distension.     Tenderness: There is no abdominal tenderness.  Skin:    General: Skin is warm and dry.  Neurological:     General: No focal deficit present.     Mental Status: He is alert. Mental status is at baseline.  Psychiatric:        Mood and Affect: Mood normal.        Behavior: Behavior normal.     ED Results / Procedures / Treatments   Labs (all labs ordered are listed, but only abnormal results  are displayed) Labs Reviewed  BASIC METABOLIC PANEL - Abnormal; Notable for the following components:      Result Value   Potassium 5.6 (*)    Glucose, Bld 164 (*)    BUN 49 (*)    Creatinine, Ser 2.24 (*)    GFR, Estimated 28 (*)    All other components within normal limits  CBC - Abnormal; Notable for the following components:   Platelets 142 (*)    All other components within normal limits  POTASSIUM    EKG EKG Interpretation Date/Time:  Wednesday June 02 2023 20:18:33 EDT Ventricular Rate:  69 PR Interval:  211 QRS Duration:  101 QT Interval:  408 QTC Calculation: 438 R Axis:   -9  Text Interpretation: Sinus rhythm Low voltage, precordial leads Confirmed by Alvester Chou [phone]) on 06/02/2023 8:19:56 PM  Radiology DG Chest Port 1 View  Result Date: 06/02/2023 CLINICAL DATA:  Atrial fibrillation EXAM: PORTABLE CHEST 1 VIEW COMPARISON:  Chest x-ray 04/18/2022 FINDINGS: The heart size and mediastinal contours are within normal limits. Both lungs are clear. The visualized skeletal structures are unremarkable. Right axillary surgical clips are again seen. IMPRESSION: No active disease. Electronically Signed   By: Darliss Cheney M.D.   On: 06/02/2023 21:29    Procedures .Critical Care  Performed by: Terald Sleeper, MD Authorized by: Terald Sleeper, MD   Critical care provider statement:    Critical care time (minutes):  30   Critical care time was exclusive of:  Separately billable procedures and treating other patients   Critical care was necessary to treat or prevent imminent or life-threatening deterioration of the following conditions:  Metabolic crisis   Critical care was time spent personally by me on the following activities:  Ordering and performing treatments and interventions, ordering and review of laboratory studies, ordering and review of radiographic studies, pulse oximetry, review of old charts, examination of patient and evaluation of patient's  response to treatment Comments:     Hyperkalemia management     Medications Ordered in ED Medications  sodium zirconium cyclosilicate (LOKELMA) packet 10 g (10 g Oral Given 06/02/23 2116)  albuterol (PROVENTIL) (2.5 MG/3ML) 0.083% nebulizer solution 10 mg (10 mg Nebulization Given 06/02/23 2116)  sodium chloride 0.9 % bolus 500 mL (0 mLs Intravenous Stopped 06/02/23 2215)    ED Course/ Medical Decision Making/ A&P Clinical Course as of 06/02/23 2324  Wed Jun 02, 2023  2223 Completed medications - repeating K now [MT]    Clinical Course User Index [MT] Terald Sleeper, MD  Medical Decision Making Amount and/or Complexity of Data Reviewed Labs: ordered. Radiology: ordered.  Risk Prescription drug management.   This patient presents to the ED with concern for incidental hyperkalemia. This involves an extensive number of treatment options, and is a complaint that carries with it a high risk of complications and morbidity.  The differential diagnosis includes hemolysis or lab error versus kidney injury versus other  Co-morbidities that complicate the patient evaluation: History of chronic kidney disease at high risk of kidney injury complications   I ordered and personally interpreted labs.  The pertinent results include:  K 5.6 -> 4.9   The patient was maintained on a cardiac monitor.  I personally viewed and interpreted the cardiac monitored which showed an underlying rhythm of: NSR  Per my interpretation the patient's ECG shows NSR, no acute hyperkalemic changes  I ordered medication including fluid bolus, Lokelma, and albuterol for hyperkalemia  I have reviewed the patients home medicines and have made adjustments as needed  After the interventions noted above, I reevaluated the patient and found that they have: stayed the same   Dispostion:  After consideration of the diagnostic results and the patients response to treatment, I  feel that the patent would benefit from outpatient follow-up.  At this point I suspect the patient's mild hyperkalemia may have been related to some mild dehydration with elevated BUN level.  I suspect the fluids helped improve his potassium.  I encouraged him to stay well-hydrated at home and to follow-up with his urologist or PCP to have his potassium rechecked.  I did prescribe a very short course of Lokelma in the interim.  I do not see an indication for hospitalization based on his presentation.  The patient was comfortable this plan and stable for discharge.         Final Clinical Impression(s) / ED Diagnoses Final diagnoses:  Hyperkalemia    Rx / DC Orders ED Discharge Orders          Ordered    sodium zirconium cyclosilicate (LOKELMA) 5 g packet  Daily        06/02/23 2249              Terald Sleeper, MD 06/02/23 2324

## 2023-06-03 NOTE — Anesthesia Preprocedure Evaluation (Addendum)
Anesthesia Evaluation  Patient identified by MRN, date of birth, ID band Patient awake    Reviewed: Allergy & Precautions, NPO status , Patient's Chart, lab work & pertinent test results, reviewed documented beta blocker date and time   Airway Mallampati: III  TM Distance: >3 FB Neck ROM: Full    Dental no notable dental hx. (+) Dental Advisory Given, Teeth Intact   Pulmonary sleep apnea and Continuous Positive Airway Pressure Ventilation    Pulmonary exam normal breath sounds clear to auscultation       Cardiovascular hypertension, Pt. on home beta blockers and Pt. on medications + angina  + CAD and +CHF  Normal cardiovascular exam+ dysrhythmias Atrial Fibrillation  Rhythm:Regular Rate:Normal  TTE 2023 1. Left ventricular ejection fraction, by estimation, is 40 to 45%. The  left ventricle has mildly decreased function. The left ventricle  demonstrates global hypokinesis. There is moderate concentric left  ventricular hypertrophy. Left ventricular  diastolic parameters are consistent with Grade I diastolic dysfunction  (impaired relaxation).   2. Right ventricular systolic function is mildly reduced. The right  ventricular size is normal. There is normal pulmonary artery systolic  pressure.   3. No evidence of mitral valve regurgitation.   4. Aortic valve regurgitation is not visualized.   5. Aneurysm of the ascending aorta, measuring 45 mm.   6. The inferior vena cava is normal in size with greater than 50%  respiratory variability, suggesting right atrial pressure of 3 mmHg.   Cath 2023 Intravascular ultrasound demonstrates moderate eccentric calcific left main stenosis with a minimal lumen area of approximately 12 mm.  PCI is deferred.   IVUS findings: There is concentric LAD calcification with no stenosis.  There is mild nonobstructive plaquing in the distal and midportion of the left main.  There is moderate eccentric  calcified stenosis at the ostium of the left main with a minimal lumen area of 12 mm.    Neuro/Psych negative neurological ROS  negative psych ROS   GI/Hepatic Neg liver ROS,GERD  ,,  Endo/Other  diabetes, Type 2, Oral Hypoglycemic Agents    Renal/GU Renal InsufficiencyRenal diseaseLab Results      Component                Value               Date                      NA                       138                 06/08/2023                CL                       110                 06/08/2023                K                        5.3 (H)             06/08/2023                CO2  23                  06/02/2023                BUN                      46 (H)              06/08/2023                CREATININE               2.20 (H)            06/08/2023                GFRNONAA                 28 (L)              06/02/2023                CALCIUM                  9.6                 06/02/2023                PHOS                     5.3 (H)             02/17/2022                ALBUMIN                  4.1                 07/22/2022                GLUCOSE                  90                  06/08/2023             negative genitourinary   Musculoskeletal  (+) Arthritis ,    Abdominal   Peds  Hematology  (+) Blood dyscrasia (eliquis: LD today, OK per Dr. Arita Miss)   Anesthesia Other Findings Last solid at 8am  Reproductive/Obstetrics                             Anesthesia Physical Anesthesia Plan  ASA: 3  Anesthesia Plan: General   Post-op Pain Management: Tylenol PO (pre-op)*   Induction: Intravenous  PONV Risk Score and Plan: 3 and Ondansetron, Dexamethasone and Treatment may vary due to age or medical condition  Airway Management Planned: LMA  Additional Equipment:   Intra-op Plan:   Post-operative Plan: Extubation in OR  Informed Consent: I have reviewed the patients History and Physical, chart, labs and discussed  the procedure including the risks, benefits and alternatives for the proposed anesthesia with the patient or authorized representative who has indicated his/her understanding and acceptance.     Dental advisory given  Plan Discussed with: CRNA  Anesthesia Plan Comments: (See PAT note 06/02/23)       Anesthesia Quick Evaluation

## 2023-06-03 NOTE — Progress Notes (Signed)
Anesthesia Chart Review   Case: [id number] Date/Time: 06/08/23 1315   Procedure: DIAGNOSTIC CYSTOSCOPY WITH POSSIBLE OPTILUME URETHRAL DILATATION, AND OTHER INDICATED PROCEDURE - 45 MINUTES   Anesthesia type: Monitor Anesthesia Care   Pre-op diagnosis: WEAK URINARY STREAM   Location: [facility] ROOM 03 / [facility]   Surgeons: Noel Christmas, MD       DISCUSSION:87 y.o. never smoker with h/o HTN, sleep apnea unable to tolerate CPAP, atrial fibrillation, CAD, CHF, DM II, weak urinary stream scheduled for above procedure 06/08/2023 with Dr. Kasandra Knudsen.   Pt last seen by cardiology 11/16/2022. Per OV note, "Chronic systolic CHF: Suspect nonischemic cardiomyopathy, possibly tachycardia-mediated. As above, he has coronary disease but this did not appear hemodynamically significant by IVUS evaluation.  Echo in 7/23 showed EF 25-30%, mild LV dilation, RV normal, moderate biatrial enlargement. He has been in NSR, and echo 12/23 showed EF up to 40-45%, mild global hypokinesis, mild LVH, mildly decreased RV systolic function.  He is not volume overloaded on exam.  NYHA class II symptoms. GDMT limited by soft BP and CKD. "  Anticipate pt can proceed with planned procedure barring acute status change, evaluate DOS. Seen in ED 10/23 for hyperkalemia, K 6.5, on discharge K 4.9.  VS: BP 110/63   Pulse 70   Temp 36.7 C (Oral)   Resp 16   Ht 5\' 10"  (1.778 m)   Wt 76.2 kg   SpO2 97%   BMI 24.11 kg/m   PROVIDERS: Donita Brooks, MD is PCP    LABS: Labs reviewed: Acceptable for surgery. and pt went to ED due to hyperkalemia, K 4.9 at discharge  (all labs ordered are listed, but only abnormal results are displayed)  Labs Reviewed  HEMOGLOBIN A1C - Abnormal; Notable for the following components:      Result Value   Hgb A1c MFr Bld 6.0 (*)    All other components within normal limits  BASIC METABOLIC PANEL - Abnormal; Notable for the following components:   Potassium 6.5 (*)    Glucose, Bld 181 (*)     BUN 49 (*)    Creatinine, Ser 2.22 (*)    GFR, Estimated 28 (*)    All other components within normal limits  CBC - Abnormal; Notable for the following components:   RBC 4.20 (*)    Hemoglobin 12.9 (*)    Platelets 125 (*)    All other components within normal limits  GLUCOSE, CAPILLARY - Abnormal; Notable for the following components:   Glucose-Capillary 212 (*)    All other components within normal limits     IMAGES:   EKG:   CV: Echo 07/22/2022  1. Left ventricular ejection fraction, by estimation, is 40 to 45%. The  left ventricle has mildly decreased function. The left ventricle  demonstrates global hypokinesis. There is moderate concentric left  ventricular hypertrophy. Left ventricular  diastolic parameters are consistent with Grade I diastolic dysfunction  (impaired relaxation).   2. Right ventricular systolic function is mildly reduced. The right  ventricular size is normal. There is normal pulmonary artery systolic  pressure.   3. No evidence of mitral valve regurgitation.   4. Aortic valve regurgitation is not visualized.   5. Aneurysm of the ascending aorta, measuring 45 mm.   6. The inferior vena cava is normal in size with greater than 50%  respiratory variability, suggesting right atrial pressure of 3 mmHg.   Conclusion(s)/Recommendation(s): Compared to prior TTE 02/17/2022, EF has  improved.  Past  Medical History:  Diagnosis Date   Anal fissure    Arthritis    Atrial fibrillation (HCC)    Back pain    CHF (congestive heart failure) (HCC)    Colon polyps    Coronary artery disease    Diabetes mellitus type 2 with complications (HCC)    Dysrhythmia    a-fib   GERD (gastroesophageal reflux disease)    History of echocardiogram 07/ 07/ 2011   History of kidney stones    History of lithotripsy 1989   Hyperlipidemia    Hypertension    Kidney stones    Melanoma (HCC) 05/01/2019   right chest wall (12/20)   Prostate CA (HCC) 2010   Sleep apnea     uses CPAP nightly   Squamous cell carcinoma of skin 05/23/1992   bowens-left parietal scalp (CX35FU)   Squamous cell carcinoma of skin 03/14/2008   in situ-left upper outer forehead-medial (CX35FU)   Squamous cell carcinoma of skin 03/14/2008   in situ-crown of scalp (Cx35FU)   Squamous cell carcinoma of skin 04/15/2011   in situ-right dorsal forearm (txpbx)   Squamous cell carcinoma of skin 10/16/2011   in situ-left sideburn   Squamous cell carcinoma of skin 03/11/2015   ka-left sideburn (CX35FU)   Squamous cell carcinoma of skin 03/11/2015   ka-left forearm (CX35FU)   Squamous cell carcinoma of skin 06/22/2018   in situ-left forearm, sup (txpbx)   Squamous cell carcinoma of skin 05/01/2019   in situ-mid anterior scalp    Squamous cell carcinoma of skin 05/01/2019   in situ-right upper arm    Past Surgical History:  Procedure Laterality Date   ATRIAL FIBRILLATION ABLATION N/A 04/17/2020   Procedure: ATRIAL FIBRILLATION ABLATION;  Surgeon: Lanier Prude, MD;  Location: [facility];  Service: Cardiovascular;  Laterality: N/A;   AXILLARY SENTINEL NODE BIOPSY Right 08/23/2019   Procedure: SENTINEL LYMPH NODE BIOSPY RIGHT AXILLA;  Surgeon: Almond Lint, MD;  Location: [facility];  Service: General;  Laterality: Right;   CARDIOVERSION N/A 05/10/2020   Procedure: CARDIOVERSION;  Surgeon: Meriam Sprague, MD;  Location: [facility] ENDOSCOPY;  Service: Cardiovascular;  Laterality: N/A;   CARDIOVERSION N/A 05/08/2021   Procedure: CARDIOVERSION;  Surgeon: Little Ishikawa, MD;  Location: [facility] ENDOSCOPY;  Service: Cardiovascular;  Laterality: N/A;   CARDIOVERSION N/A 02/17/2022   Procedure: CARDIOVERSION;  Surgeon: Jake Bathe, MD;  Location: [facility] ENDOSCOPY;  Service: Cardiovascular;  Laterality: N/A;   COLONOSCOPY  2002   CORONARY ANGIOGRAPHY N/A 02/13/2022   Procedure: CORONARY ANGIOGRAPHY;  Surgeon: Tonny Bollman, MD;  Location: [facility] INVASIVE CV LAB;  Service:  Cardiovascular;  Laterality: N/A;   CORONARY ULTRASOUND/IVUS N/A 02/13/2022   Procedure: Intravascular Ultrasound/IVUS;  Surgeon: Tonny Bollman, MD;  Location: [facility] INVASIVE CV LAB;  Service: Cardiovascular;  Laterality: N/A;   FASCIECTOMY Left 03/28/2019   Procedure: SEGMENTAL FASCIECTOMY LEFT RING FINGER;  Surgeon: Cindee Salt, MD;  Location: [facility];  Service: Orthopedics;  Laterality: Left;  ANESTHESIA  AXILLARY BLOCK   INSERTION PROSTATE RADIATION SEED  8 4 2010   per Dr Earlene Plater   MELANOMA EXCISION Right 06/22/2019   Procedure: WIDE LOCAL EXCISION RIGHT CHEST WALL MELANOMA, ADVANCEMENT FLAP CLOSURE FOR DEFECT 3X6 CM;  Surgeon: Almond Lint, MD;  Location: [facility];  Service: General;  Laterality: Right;   POLYPECTOMY  2002   RIGHT/LEFT HEART CATH AND CORONARY ANGIOGRAPHY N/A 02/11/2022   Procedure: RIGHT/LEFT HEART CATH AND CORONARY ANGIOGRAPHY;  Surgeon: Yvonne Kendall, MD;  Location: [facility] INVASIVE CV LAB;  Service: Cardiovascular;  Laterality: N/A;   TEE WITHOUT CARDIOVERSION N/A 02/17/2022   Procedure: TRANSESOPHAGEAL ECHOCARDIOGRAM (TEE);  Surgeon: Jake Bathe, MD;  Location: [facility] ENDOSCOPY;  Service: Cardiovascular;  Laterality: N/A;   THROAT SURGERY  1980   benign cyst   UPPER GASTROINTESTINAL ENDOSCOPY     URETHRAL STRICTURE DILATATION     also penile implant    MEDICATIONS:  acetaminophen (TYLENOL) 325 MG tablet   amiodarone (PACERONE) 200 MG tablet   atorvastatin (LIPITOR) 80 MG tablet   ELIQUIS 2.5 MG TABS tablet   empagliflozin (JARDIANCE) 10 MG TABS tablet   finasteride (PROSCAR) 5 MG tablet   furosemide (LASIX) 40 MG tablet   linagliptin (TRADJENTA) 5 MG TABS tablet   LORazepam (ATIVAN) 0.5 MG tablet   metoprolol succinate (TOPROL-XL) 50 MG 24 hr tablet   neomycin-polymyxin-hydrocortisone (CORTISPORIN) OTIC solution   nitroGLYCERIN (NITROSTAT) 0.4 MG SL tablet   oxymetazoline (AFRIN) 0.05 % nasal spray   sodium zirconium  cyclosilicate (LOKELMA) 5 g packet   spironolactone (ALDACTONE) 25 MG tablet   traZODone (DESYREL) 50 MG tablet   No current facility-administered medications for this encounter.    Jodell Cipro Ward, PA-C [facility] [phone]

## 2023-06-07 ENCOUNTER — Other Ambulatory Visit (HOSPITAL_COMMUNITY): Payer: Self-pay | Admitting: Cardiology

## 2023-06-07 ENCOUNTER — Other Ambulatory Visit: Payer: Self-pay | Admitting: *Deleted

## 2023-06-07 DIAGNOSIS — I480 Paroxysmal atrial fibrillation: Secondary | ICD-10-CM

## 2023-06-07 MED ORDER — EMPAGLIFLOZIN 10 MG PO TABS: 10.0000 mg | ORAL_TABLET | Freq: Every day | ORAL | 3 refills | Status: DC

## 2023-06-07 MED ORDER — ATORVASTATIN CALCIUM 80 MG PO TABS: 80.0000 mg | ORAL_TABLET | Freq: Every day | ORAL | 1 refills | Status: DC

## 2023-06-07 MED ORDER — FUROSEMIDE 40 MG PO TABS: 20.0000 mg | ORAL_TABLET | ORAL | 8 refills | Status: DC | PRN

## 2023-06-07 MED ORDER — METOPROLOL SUCCINATE ER 25 MG PO TB24: 25.0000 mg | ORAL_TABLET | Freq: Every day | ORAL | 3 refills | Status: DC

## 2023-06-07 MED ORDER — AMIODARONE HCL 200 MG PO TABS: 100.0000 mg | ORAL_TABLET | Freq: Every day | ORAL | 3 refills | Status: DC

## 2023-06-07 MED ORDER — SPIRONOLACTONE 25 MG PO TABS: 25.0000 mg | ORAL_TABLET | Freq: Every day | ORAL | 1 refills | Status: DC

## 2023-06-07 MED ORDER — APIXABAN 2.5 MG PO TABS: 2.5000 mg | ORAL_TABLET | Freq: Two times a day (BID) | ORAL | 1 refills | Status: DC

## 2023-06-07 NOTE — Telephone Encounter (Signed)
Eliquis 2.5mg  refill request received. Patient is 87 years old, weight-76.2kg, Crea-2.24 on 06/02/23, Diagnosis-AFIB, and last seen by Dr. Shirlee Latch on 11/16/22 & Robin Searing 05/04/22. Dose is appropriate based on dosing criteria.    The fax number for [facility] is different from AND DOES NOT GO THROUGH. Will have the original faxed rx signed by MD and sent to the correct fax

## 2023-06-08 ENCOUNTER — Ambulatory Visit (HOSPITAL_BASED_OUTPATIENT_CLINIC_OR_DEPARTMENT_OTHER): Payer: Medicare Other | Admitting: Anesthesiology

## 2023-06-08 ENCOUNTER — Ambulatory Visit (HOSPITAL_COMMUNITY): Payer: Medicare Other

## 2023-06-08 ENCOUNTER — Ambulatory Visit (HOSPITAL_COMMUNITY)
Admission: RE | Admit: 2023-06-08 | Discharge: 2023-06-08 | Disposition: A | Discharge status: Home / Self Care | Payer: Medicare Other | Attending: Urology | Admitting: Urology

## 2023-06-08 ENCOUNTER — Encounter (HOSPITAL_COMMUNITY): Payer: Self-pay | Admitting: Urology

## 2023-06-08 ENCOUNTER — Ambulatory Visit (HOSPITAL_COMMUNITY): Payer: Medicare Other | Admitting: Physician Assistant

## 2023-06-08 ENCOUNTER — Encounter (HOSPITAL_COMMUNITY)
Admission: RE | Disposition: A | Discharge status: Home / Self Care | Payer: Self-pay | Source: Home / Self Care | Attending: Urology

## 2023-06-08 DIAGNOSIS — N35912 Unspecified bulbous urethral stricture, male: Secondary | ICD-10-CM | POA: Insufficient documentation

## 2023-06-08 DIAGNOSIS — I110 Hypertensive heart disease with heart failure: Secondary | ICD-10-CM | POA: Diagnosis not present

## 2023-06-08 DIAGNOSIS — I4891 Unspecified atrial fibrillation: Secondary | ICD-10-CM | POA: Insufficient documentation

## 2023-06-08 DIAGNOSIS — I509 Heart failure, unspecified: Secondary | ICD-10-CM | POA: Diagnosis not present

## 2023-06-08 DIAGNOSIS — Z8546 Personal history of malignant neoplasm of prostate: Secondary | ICD-10-CM | POA: Diagnosis not present

## 2023-06-08 DIAGNOSIS — Z9889 Other specified postprocedural states: Secondary | ICD-10-CM | POA: Insufficient documentation

## 2023-06-08 DIAGNOSIS — Z7901 Long term (current) use of anticoagulants: Secondary | ICD-10-CM | POA: Insufficient documentation

## 2023-06-08 DIAGNOSIS — R3912 Poor urinary stream: Secondary | ICD-10-CM | POA: Diagnosis present

## 2023-06-08 DIAGNOSIS — Z7984 Long term (current) use of oral hypoglycemic drugs: Secondary | ICD-10-CM | POA: Diagnosis not present

## 2023-06-08 DIAGNOSIS — Z923 Personal history of irradiation: Secondary | ICD-10-CM | POA: Diagnosis not present

## 2023-06-08 DIAGNOSIS — G4733 Obstructive sleep apnea (adult) (pediatric): Secondary | ICD-10-CM

## 2023-06-08 DIAGNOSIS — I2510 Atherosclerotic heart disease of native coronary artery without angina pectoris: Secondary | ICD-10-CM | POA: Diagnosis not present

## 2023-06-08 DIAGNOSIS — E118 Type 2 diabetes mellitus with unspecified complications: Secondary | ICD-10-CM

## 2023-06-08 DIAGNOSIS — E119 Type 2 diabetes mellitus without complications: Secondary | ICD-10-CM | POA: Insufficient documentation

## 2023-06-08 DIAGNOSIS — Z79899 Other long term (current) drug therapy: Secondary | ICD-10-CM | POA: Insufficient documentation

## 2023-06-08 HISTORY — PX: CYSTOSCOPY WITH URETHRAL DILATATION: SHX5125

## 2023-06-08 LAB — GLUCOSE, CAPILLARY
Glucose-Capillary: 137 mg/dL — ABNORMAL HIGH (ref 70–99)
Glucose-Capillary: 88 mg/dL (ref 70–99)
Glucose-Capillary: 94 mg/dL (ref 70–99)

## 2023-06-08 LAB — POCT I-STAT, CHEM 8
BUN: 46 mg/dL — ABNORMAL HIGH (ref 8–23)
Calcium, Ion: 1.25 mmol/L (ref 1.15–1.40)
Chloride: 110 mmol/L (ref 98–111)
Creatinine, Ser: 2.2 mg/dL — ABNORMAL HIGH (ref 0.61–1.24)
Glucose, Bld: 90 mg/dL (ref 70–99)
HCT: 39 % (ref 39.0–52.0)
Hemoglobin: 13.3 g/dL (ref 13.0–17.0)
Potassium: 5.3 mmol/L — ABNORMAL HIGH (ref 3.5–5.1)
Sodium: 138 mmol/L (ref 135–145)
TCO2: 20 mmol/L — ABNORMAL LOW (ref 22–32)

## 2023-06-08 SURGERY — CYSTOSCOPY, WITH URETHRAL DILATION
Anesthesia: General

## 2023-06-08 MED ORDER — PROPOFOL 10 MG/ML IV BOLUS
INTRAVENOUS | Status: AC
Start: 2023-06-08 — End: ?
  Filled 2023-06-08: qty 20

## 2023-06-08 MED ORDER — PROPOFOL 10 MG/ML IV BOLUS
INTRAVENOUS | Status: DC | PRN
  Administered 2023-06-08: 20 mg via INTRAVENOUS
  Administered 2023-06-08: 100 mg via INTRAVENOUS

## 2023-06-08 MED ORDER — STERILE WATER FOR IRRIGATION IR SOLN
Status: DC | PRN
  Administered 2023-06-08: 500 mL

## 2023-06-08 MED ORDER — DEXAMETHASONE SODIUM PHOSPHATE 4 MG/ML IJ SOLN
INTRAMUSCULAR | Status: DC | PRN
  Administered 2023-06-08: 5 mg via INTRAVENOUS

## 2023-06-08 MED ORDER — CHLORHEXIDINE GLUCONATE 0.12 % MT SOLN
15.0000 mL | Freq: Once | OROMUCOSAL | Status: AC
Start: 2023-06-08 — End: 2023-06-08
  Administered 2023-06-08: 15 mL via OROMUCOSAL

## 2023-06-08 MED ORDER — SODIUM CHLORIDE 0.9 % IR SOLN
Status: DC | PRN
  Administered 2023-06-08: 3000 mL via INTRAVESICAL

## 2023-06-08 MED ORDER — CEFAZOLIN SODIUM-DEXTROSE 2-4 GM/100ML-% IV SOLN
2.0000 g | INTRAVENOUS | Status: AC
  Administered 2023-06-08: 2 g via INTRAVENOUS
  Filled 2023-06-08: qty 100

## 2023-06-08 MED ORDER — DROPERIDOL 2.5 MG/ML IJ SOLN: 0.6250 mg | Freq: Once | INTRAMUSCULAR | Status: DC | PRN

## 2023-06-08 MED ORDER — IOHEXOL 300 MG/ML  SOLN
INTRAMUSCULAR | Status: DC | PRN
  Administered 2023-06-08: 10 mL

## 2023-06-08 MED ORDER — FENTANYL CITRATE PF 50 MCG/ML IJ SOSY: 25.0000 ug | PREFILLED_SYRINGE | INTRAMUSCULAR | Status: DC | PRN

## 2023-06-08 MED ORDER — LIDOCAINE HCL (PF) 2 % IJ SOLN
INTRAMUSCULAR | Status: AC
Start: 2023-06-08 — End: ?
  Filled 2023-06-08: qty 5

## 2023-06-08 MED ORDER — LIDOCAINE 2% (20 MG/ML) 5 ML SYRINGE
INTRAMUSCULAR | Status: DC | PRN
  Administered 2023-06-08: 80 mg via INTRAVENOUS

## 2023-06-08 MED ORDER — ONDANSETRON HCL 4 MG/2ML IJ SOLN
INTRAMUSCULAR | Status: DC | PRN
  Administered 2023-06-08: 4 mg via INTRAVENOUS

## 2023-06-08 MED ORDER — ACETAMINOPHEN 500 MG PO TABS
1000.0000 mg | ORAL_TABLET | Freq: Once | ORAL | Status: AC
Start: 2023-06-08 — End: 2023-06-08
  Administered 2023-06-08: 1000 mg via ORAL
  Filled 2023-06-08: qty 2

## 2023-06-08 MED ORDER — LACTATED RINGERS IV SOLN: INTRAVENOUS | Status: DC

## 2023-06-08 MED ORDER — ORAL CARE MOUTH RINSE: 15.0000 mL | Freq: Once | OROMUCOSAL | Status: AC

## 2023-06-08 MED ORDER — DEXAMETHASONE SODIUM PHOSPHATE 10 MG/ML IJ SOLN
INTRAMUSCULAR | Status: AC
  Filled 2023-06-08: qty 1

## 2023-06-08 MED ORDER — FENTANYL CITRATE PF 50 MCG/ML IJ SOSY
PREFILLED_SYRINGE | INTRAMUSCULAR | Status: AC
  Administered 2023-06-08: 25 ug via INTRAVENOUS
  Filled 2023-06-08: qty 2

## 2023-06-08 MED ORDER — ONDANSETRON HCL 4 MG/2ML IJ SOLN
INTRAMUSCULAR | Status: AC
  Filled 2023-06-08: qty 2

## 2023-06-08 SURGICAL SUPPLY — 24 items
BAG DRN RND TRDRP ANRFLXCHMBR (UROLOGICAL SUPPLIES) ×1
BAG URINE DRAIN 2000ML AR STRL (UROLOGICAL SUPPLIES) ×1 IMPLANT
BALLN NEPHROSTOMY (BALLOONS)
BALLN OPTILUME DCB 30X5X75 (BALLOONS) ×1
BALLOON NEPHROSTOMY (BALLOONS) IMPLANT
BALLOON OPTILUME DCB 30X5X75 (BALLOONS) IMPLANT
CATH FOLEY 2W COUNCIL 20FR 5CC (CATHETERS) IMPLANT
CATH FOLEY 2W COUNCIL 5CC 16FR (CATHETERS) IMPLANT
CATH ROBINSON RED A/P 14FR (CATHETERS) IMPLANT
CATH URET 5FR 70CM CONE TIP (BALLOONS) IMPLANT
CATH URETL OPEN END 6FR 70 (CATHETERS) IMPLANT
CLOTH BEACON ORANGE TIMEOUT ST (SAFETY) ×1 IMPLANT
GLOVE BIO SURGEON STRL SZ 6.5 (GLOVE) ×1 IMPLANT
GOWN STRL REUS W/ TWL LRG LVL3 (GOWN DISPOSABLE) ×1 IMPLANT
GOWN STRL REUS W/TWL LRG LVL3 (GOWN DISPOSABLE) ×1
GUIDEWIRE ANG ZIPWIRE 038X150 (WIRE) IMPLANT
GUIDEWIRE STR DUAL SENSOR (WIRE) IMPLANT
HOLDER FOLEY CATH W/STRAP (MISCELLANEOUS) IMPLANT
KIT TURNOVER KIT A (KITS) IMPLANT
MANIFOLD NEPTUNE II (INSTRUMENTS) IMPLANT
NS IRRIG 1000ML POUR BTL (IV SOLUTION) IMPLANT
PACK CYSTO (CUSTOM PROCEDURE TRAY) ×1 IMPLANT
PAD PREP 24X48 CUFFED NSTRL (MISCELLANEOUS) ×1 IMPLANT
WATER STERILE IRR 3000ML UROMA (IV SOLUTION) ×1 IMPLANT

## 2023-06-08 NOTE — Progress Notes (Signed)
  Dr. Arita Miss and Dr. Armond Hang aware that patient had an egg this morning @ 0800 this morning . Pt took his eliquis , Jardiance and Tradjenta  this morning.  Ok to proceed with surgery with a new start time of 1600 . No further orders at this time

## 2023-06-08 NOTE — Transfer of Care (Signed)
Immediate Anesthesia Transfer of Care Note  Patient: Antonio Clark  Procedure(s) Performed: DIAGNOSTIC CYSTOSCOPY WITH OPTILUME URETHRAL DILATATION  Patient Location: PACU  Anesthesia Type:General  Level of Consciousness: awake and patient cooperative  Airway & Oxygen Therapy: Patient Spontanous Breathing  Post-op Assessment: Report given to RN and Post -op Vital signs reviewed and stable  Post vital signs: Reviewed and stable  Last Vitals:  Vitals Value Taken Time  BP    Temp    Pulse 53 06/08/23 1643  Resp 12 06/08/23 1643  SpO2 100 % 06/08/23 1643  Vitals shown include unfiled device data.  Last Pain:  Vitals:   06/08/23 1406  TempSrc:   PainSc: 0-No pain         Complications: No notable events documented.

## 2023-06-08 NOTE — Anesthesia Procedure Notes (Signed)
Procedure Name: LMA Insertion Date/Time: 06/08/2023 4:11 PM  Performed by: Vanessa [place], CRNAPre-anesthesia Checklist: Emergency Drugs available, Patient identified, Suction available and Patient being monitored Patient Re-evaluated:Patient Re-evaluated prior to induction Oxygen Delivery Method: Circle system utilized Preoxygenation: Pre-oxygenation with 100% oxygen Induction Type: IV induction Ventilation: Mask ventilation without difficulty LMA: LMA inserted LMA Size: 4.0 Number of attempts: 1 Placement Confirmation: positive ETCO2 and breath sounds checked- equal and bilateral Tube secured with: Tape Dental Injury: Teeth and Oropharynx as per pre-operative assessment

## 2023-06-08 NOTE — Op Note (Signed)
Operative Note  Preoperative diagnosis:  1.  Bulbar urethral stricture  Postoperative diagnosis: 1.  Bulbar urethral stricture  Procedure(s): 1.  Diagnostic cystoscopy with Optilume urethral dilation  Surgeon: Kasandra Knudsen, MD  Assistants:  None  Anesthesia:  General  Complications:  None  EBL: Minimal  Specimens: 1.  None  Drains/Catheters: 1.  16 French council tip Foley  Intraoperative findings:   Bulbar urethral stricture Lateral lobe hypertrophy.  No extruded brachytherapy seed seen. Bilateral ureteral orifices Moderate trabeculation.  No bladder masses seen.  Large capacity bladder.  Indication:  Antonio Clark is a 87 y.o. male with history of prostate cancer treated with brachytherapy in 2010 also with a history of urethral stricture requiring multiple dilations in the past last which was done 7 to 8 years ago.  He has had increasing LUTS with weak stream and frequency.  He did not tolerate dilation well in the office previously.  Description of procedure:  After risks, benefits and alternatives were discussed with the patient, informed consent was obtained. The patient was taken to the operating room and placed in the supine position.  Anesthesia was induced and antibiotics were administered.  The patient was then repositioned in the dorsolithotomy position.  He was prepped and draped in usual sterile fashion throughout was performed.  A 21 French rigid cystoscope was placed in the urethra meatus and advanced to bulbar urethra at which time a stricture was encountered.  A 0.38 sensor wire was used to cannulate this area and the wire was advanced into the bladder with fluoroscopy.  The Optilume balloon dilator was then advanced over the wire so that the radiopaque marker spanned the strictured segment.  The wound was inflated to 12 mmHg and kept in place for 3 minutes.  It was then deflated and removed.  Cystoscopy took place alongside the wire and the scope could be  navigated into the bladder.  Diagnostic cystoscopy was normal.  The cystoscope was removed.  A 16 French council tip Foley was then placed over the wire and inflated with 10 cc sterile water.  The wire was removed.  The patient emerged from anesthesia and was transferred the PACU in stable condition.  Plan: Continue Foley catheter for 3 days.  Void trial planned in the office.

## 2023-06-08 NOTE — Discharge Instructions (Signed)
Cystoscopy with Urethral Dilation patient instructions  Following a cystoscopy, a catheter (a flexible rubber tube) is sometimes left in place to empty the bladder. This may cause some discomfort or a feeling that you need to urinate. Your doctor determines the period of time that the catheter will be left in place. You may have bloody urine for two to three days (Call your doctor if the amount of bleeding increases or does not subside).  You may pass blood clots in your urine, especially if you had a biopsy. It is not unusual to pass small blood clots and have some bloody urine a couple of weeks after your cystoscopy. Again, call your doctor if the bleeding does not subside. You may have: Dysuria (painful urination) Frequency (urinating often) Urgency (strong desire to urinate)  These symptoms are common especially if medicine is instilled into the bladder or a ureteral stent is placed. Avoiding alcohol and caffeine, such as coffee, tea, and chocolate, may help relieve these symptoms. Drink plenty of water, unless otherwise instructed. Your doctor may also prescribe an antibiotic or other medicine to reduce these symptoms.  Cystoscopy results are available soon after the procedure; biopsy results usually take two to four days. Your doctor will discuss the results of your exam with you. Before you go home, you will be given specific instructions for follow-up care. Special Instructions:   If you are going home with a catheter in place do not take a tub bath until removed by your doctor.   You may resume your normal activities.   Do not drive or operate machinery if you are taking narcotic pain medicine.   Be sure to keep all follow-up appointments with your doctor.   Call Your Doctor If: The catheter is not draining You have severe pain You are unable to urinate You have a fever over 101 You have severe bleeding

## 2023-06-08 NOTE — H&P (Signed)
CC/HPI: cc: Weak urinary stream, history of prostate cancer   05/05/2023: 87 year old man with a history of prostate cancer treated brachytherapy in 2010 also with a history of urethral stricture requiring multiple urethral dilations. He was previously followed by Dr. Earlene Plater and then Dr. Jerrel Ivory. He does have an IPP for ED. Patient is on Eliquis for A-fib. His last urethral dilation was about 7 or 8 years ago. He was given tamsulosin to try however had increased urinary leakage with this.    IPSS: 2, 1, 2, 2, 4, 2, 2=15/35  Mixed QOL 3/6     ALLERGIES: No Allergies    MEDICATIONS: Finasteride 5 mg tablet  Metoprolol Succinate 50 mg tablet, extended release 24 hr  Amiodarone Hcl 200 mg tablet  Eliquis 5 mg tablet  Furosemide 20 mg tablet  Jardiance 10 mg tablet  Spironolactone 25 mg tablet  Tradjenta 5 mg tablet     GU PSH: Cystoscopy VIU - 2010, 2009 ESWL - 2009 TRANSPERI NEEDLE PLACE, PROS - 2010       PSH Notes: Surgery Prostate Transperineal Placement Of Needles, Cystoscopy With Internal Urethrotomy, Direct Vision, Needle/Cath Placement For Brachyther Applic Into Genitalia, Cystoscopy With Internal Urethrotomy, Direct Vision, Lithotripsy, Neck Surgery   NON-GU PSH: Neck Surgery (Unspecified) Place Needles Pelvic For Rt - 2010     GU PMH: Bladder-neck stenosis/contracture, Bladder neck obstruction - 2014 ED due to arterial insufficiency, Erectile dysfunction due to arterial insufficiency - 2014 Elevated PSA, PSA,Elevated - 2014 History of urolithiasis, Nephrolithiasis - 2014 Oth GU systems Signs/Symptoms, Bladder pain - 2014 Personal Hx Oth Urinary System diseases, History of hematuria - 2014 Prostate Cancer, Prostate cancer - 2014 Prostate nodule w/o LUTS, Prostate Hard Area Or Nodule - 2014 Renal cyst, Renal cyst, acquired - 2014 Urethral Stricture, Unspec, Urethral stricture - 2014 History of prostate cancer      PMH Notes:  2013-06-03 05:08:13 - Note:  Cholelithiasis, unspecified acuity, unspecified acuity  1898-08-10 00:00:00 - Note: Normal Routine History And Physical Senior Citizen (65-80)   NON-GU PMH: Muscle weakness (generalized), Muscle weakness - 2014 Other lack of coordination, Other lack of coordination - 2014 Other muscle spasm, Muscle spasm - 2014 Personal history of other diseases of the circulatory system, History of hypertension - 2014 Personal history of other diseases of the digestive system, History of esophageal reflux - 2014 Anxiety Arthritis Atrial Fibrillation Diabetes Type 2 Heart disease, unspecified Hypercholesterolemia Hypertension Other heart failure Sleep Apnea    FAMILY HISTORY: Cancer - Mother Heart Disease - Father, Brother Kidney Stones - Runs In Family   SOCIAL HISTORY: Marital Status: Single Preferred Language: English; Ethnicity: Not Hispanic Or Latino; Race: [place] Current Smoking Status: Patient has never smoked.   Tobacco Use Assessment Completed: Used Tobacco in last 30 days? Does not drink anymore.  Does not drink caffeine.     Notes: Activities Of Daily Living, Living Independently With Spouse, Self-reliant In Usual Daily Activities, Exercise Habits, Caffeine Use, Tobacco Use, Alcohol Use, Marital History - Single, Occupation:   REVIEW OF SYSTEMS:    GU Review Male:   Patient reports frequent urination, get up at night to urinate, leakage of urine, stream starts and stops, trouble starting your stream, and penile pain. Patient denies hard to postpone urination, burning/ pain with urination, have to strain to urinate , and erection problems.  Gastrointestinal (Upper):   Patient denies nausea, vomiting, and indigestion/ heartburn.  Gastrointestinal (Lower):   Patient reports diarrhea. Patient denies constipation.  Constitutional:   Patient reports  fatigue. Patient denies fever, night sweats, and weight loss.  Skin:   Patient denies skin rash/ lesion and itching.  Eyes:   Patient denies  blurred vision and double vision.  Ears/ Nose/ Throat:   Patient denies sore throat and sinus problems.  Hematologic/Lymphatic:   Patient denies easy bruising and swollen glands.  Cardiovascular:   Patient denies leg swelling and chest pains.  Respiratory:   Patient denies cough and shortness of breath.  Endocrine:   Patient denies excessive thirst.  Musculoskeletal:   Patient reports back pain and joint pain.   Neurological:   Patient denies headaches and dizziness.  Psychologic:   Patient denies depression and anxiety.   VITAL SIGNS:      05/05/2023 01:06 PM  Weight 167 lb / 75.75 kg  Height 177 in / 449.58 cm  BP 117/72 mmHg  Pulse 61 /min  Temperature 97.3 F / 36.2 C  BMI 3.7 kg/m   MULTI-SYSTEM PHYSICAL EXAMINATION:    Constitutional: Well-nourished. No physical deformities. Normally developed. Good grooming.  Neck: Neck symmetrical, not swollen. Normal tracheal position.  Respiratory: No labored breathing, no use of accessory muscles.   Skin: No paleness, no jaundice, no cyanosis. No lesion, no ulcer, no rash.  Neurologic / Psychiatric: Oriented to time, oriented to place, oriented to person. No depression, no anxiety, no agitation.  Eyes: Normal conjunctivae. Normal eyelids.  Ears, Nose, Mouth, and Throat: Left ear no scars, no lesions, no masses. Right ear no scars, no lesions, no masses. Nose no scars, no lesions, no masses. Normal hearing. Normal lips.  Musculoskeletal: Normal gait and station of head and neck.     Complexity of Data:  Records Review:   AUA Symptom Score, Previous Patient Records, POC Tool  Urine Test Review:   Urinalysis  Urodynamics Review:   Review Bladder Scan   10/10/09 05/23/09 09/28/08 05/04/08 03/02/08 06/03/07 12/02/06 11/05/06  PSA  Total PSA 0.50  0.93  1.88  4.09  5.35  3.34  3.47  5.54   Free PSA    0.53  0.60    0.56   % Free PSA    13.0  11.2    10.1     07/02/04  Hormones  Testosterone, Total 3.96    Notes:                      /8/24: BUN 28, creatinine 1.85   PROCEDURES:         PVR Ultrasound - 95621  Scanned Volume: 64 cc         Visit Complexity - G2211          Urinalysis w/Scope Dipstick Dipstick Cont'd Micro  Color: Yellow Bilirubin: Neg mg/dL WBC/hpf: 0 - 5/hpf  Appearance: Cloudy Ketones: Neg mg/dL RBC/hpf: NS (Not Seen)  Specific Gravity: 1.015 Blood: Neg ery/uL Bacteria: Many (>50/hpf)  pH: <=5.0 Protein: Neg mg/dL Cystals: NS (Not Seen)  Glucose: 3+ mg/dL Urobilinogen: 0.2 mg/dL Casts: NS (Not Seen)    Nitrites: Positive Trichomonas: Not Present    Leukocyte Esterase: Neg leu/uL Mucous: Present      Epithelial Cells: NS (Not Seen)      Yeast: NS (Not Seen)      Sperm: Not Present    ASSESSMENT:      ICD-10 Details  1 GU:   History of prostate cancer - Z85.46 Chronic, Stable  2   History of urolithiasis - Z87.442 Chronic, Stable  3   Weak Urinary Stream -  R39.12 Chronic, Worsening   PLAN:           Orders Labs PSA          Document Letter(s):  Created for Patient: Clinical Summary         Notes:   1. Weak urinary stream: Concern for urethral stricture recurrence. Patient has had multiple urethral dilations and has found them very painful in the office. We discussed proceeding with diagnostic cystoscopy and possible Optilume dilation under sedation. He understands that he will have a Foley catheter following this. Risks and benefits including bleeding, infection, damage surrounding structures, recurrence.   2. History of prostate cancer: Will repeat PSA today.

## 2023-06-09 ENCOUNTER — Encounter (HOSPITAL_COMMUNITY): Payer: Self-pay | Admitting: Urology

## 2023-06-09 NOTE — Anesthesia Postprocedure Evaluation (Signed)
Anesthesia Post Note  Patient: Antonio Clark  Procedure(s) Performed: DIAGNOSTIC CYSTOSCOPY WITH OPTILUME URETHRAL DILATATION     Patient location during evaluation: PACU Anesthesia Type: General Level of consciousness: sedated and patient cooperative Pain management: pain level controlled Vital Signs Assessment: post-procedure vital signs reviewed and stable Respiratory status: spontaneous breathing Cardiovascular status: stable Anesthetic complications: no   No notable events documented.  Last Vitals:  Vitals:   06/08/23 1738 06/08/23 1800  BP: 130/60 129/61  Pulse: (!) 49   Resp:    Temp: 37 C   SpO2: 100% 98%    Last Pain:  Vitals:   06/08/23 1800  TempSrc:   PainSc: 1                  [name]

## 2023-07-15 ENCOUNTER — Other Ambulatory Visit (HOSPITAL_COMMUNITY): Payer: Self-pay | Admitting: Cardiology

## 2023-07-15 ENCOUNTER — Ambulatory Visit: Payer: Medicare Other | Admitting: Internal Medicine

## 2023-07-15 ENCOUNTER — Encounter: Payer: Self-pay | Admitting: Internal Medicine

## 2023-07-15 VITALS — BP 118/70 | HR 70 | Temp 97.3°F | Resp 18 | Ht 70.0 in | Wt 168.4 lb

## 2023-07-15 DIAGNOSIS — C61 Malignant neoplasm of prostate: Secondary | ICD-10-CM | POA: Insufficient documentation

## 2023-07-15 DIAGNOSIS — I10 Essential (primary) hypertension: Secondary | ICD-10-CM

## 2023-07-15 DIAGNOSIS — N1832 Chronic kidney disease, stage 3b: Secondary | ICD-10-CM

## 2023-07-15 DIAGNOSIS — I480 Paroxysmal atrial fibrillation: Secondary | ICD-10-CM

## 2023-07-15 DIAGNOSIS — Z6824 Body mass index (BMI) 24.0-24.9, adult: Secondary | ICD-10-CM

## 2023-07-15 DIAGNOSIS — I2510 Atherosclerotic heart disease of native coronary artery without angina pectoris: Secondary | ICD-10-CM | POA: Diagnosis not present

## 2023-07-15 DIAGNOSIS — E118 Type 2 diabetes mellitus with unspecified complications: Secondary | ICD-10-CM

## 2023-07-15 DIAGNOSIS — E119 Type 2 diabetes mellitus without complications: Secondary | ICD-10-CM | POA: Insufficient documentation

## 2023-07-15 NOTE — Progress Notes (Signed)
Office Visit  Subjective   Patient ID: Antonio Clark   DOB: 06-30-1936   Age: 87 y.o.   MRN: [id number]   Chief Complaint No chief complaint on file.    History of Present Illness Antonio Clark is a 87 yo male who comes in to establish care.  He just moved in to [facility] about 1 month ago.  He was previously followed by Dr. Tanya Nones with last visit several months.  The patient has a history of T2 diabetes that he states he was diagnosed years ago.   Since the last visit, there have been no problems. He is currently on:  Jardiance 10mg  daily and Tradjenta 5mg  daily.   He is not walking as much as they would like.Marland Kitchen He specifically denies unexplained abdominal pain, nausea or vomiting.  He does not routinely check blood sugars. His last HgBA1c was done on 06/02/2023 and was 6%.  He has no long term complications of diabetic retinopathy, nephropathy, or neuropathy  but he does have a history of CAD.  This patient has paroxysmal atrial fibrillation which he states was diagnosed in 2021.  He states that he went into A. Fib due to his COVID-19 immunization.  He presents today for a status visit. He is s/p ablation in 04/2020 and DCCV in 02/2022.  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  His last ECHO was done in 07/22/2022 that showed his LVEF was 40-45% with mildly decrease LV function.  The LV demonstrated global hypokinesis and there was moderate concentric LVH and he had Grade I diastolic dysfunction and his RV systolic function was mildly reduced.  There was normal pulmonary artery systolic pressure.  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  When compared to prior TTE 02/17/2022, his EF has improved. His atrial fibrillation is controlled with therapy as summarized in the medication list and previous notes.  He is currently on amiodarone 100mg  daily and toprol XL 25mg  daily.   Specifically denied complaints: chest pain, generalized weakness, SOB, palpitations,  syncope or TIAs. The patient is currently on anticoagulation with eliquis 2.5mg  BID.  This patient also has CAD of about years known duration and presents today for a status visit.  The patient had a left heart cath 02/2022 that showed a 70% lesion of the left main.  They did IVUS suggested that this was not hemodynamically significant and PCI was not done.  They continued him on atorvastatin 80mg  at bedtime and given eliquis use, he is not on an ASA.  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  Echo in 7/23 showed EF 25-30%, mild LV dilation, RV normal, moderate biatrial enlargement. He has been in NSR, and echo today showed EF up to 40-45%, mild global hypokinesis, mild LVH, mildly decreased RV systolic function.  Today, he denies any peripheral edema.  His last cardiology visit was in 07/2022.  The patient is a 87 year old male who presents for a follow-up evaluation of hypertension. The patient has been checking her blood pressure at home. The patient's blood pressure has ranged systollically 120-130. The patient's current medications include: no medications. The patient has been tolerating her medications well. The patient denies any headache, visual changes, dizziness, lightheadness, chest pain, shortness of breath, weakness/numbness, and edema. She reports there have been no other symptoms noted.   The patient also has Stage IIIb CKD which is probably due to his diabetes and hypertension.  It looks like his  baseline creatinine is 1.8-2.2 and his GFR 28-35.  The patient denies any NSAID use.    The patient also has a history of prostate cancer diagnosed in 2010 where he was treated with brachytherapy.  He is followed by urology with last visit in 06/08/2023 where he has a history of urethral stricture requiring multiple urethral dilations. He was noted to have a weak urinary stream and there was concern for urethral stricture  recurrence and underwent urethral stricture.   He remains on proscar at this time.       Past Medical History Past Medical History:  Diagnosis Date   Anal fissure    Arthritis    Atrial fibrillation (HCC)    Back pain    CHF (congestive heart failure) (HCC)    Colon polyps    Coronary artery disease    Diabetes mellitus type 2 with complications (HCC)    Dysrhythmia    a-fib   GERD (gastroesophageal reflux disease)    History of echocardiogram 07/ 07/ 2011   History of kidney stones    History of lithotripsy 1989   Hyperlipidemia    Hypertension    Kidney stones    Melanoma (HCC) 05/01/2019   right chest wall (12/20)   Prostate CA (HCC) 2010   Sleep apnea    uses CPAP nightly   Squamous cell carcinoma of skin 05/23/1992   bowens-left parietal scalp (CX35FU)   Squamous cell carcinoma of skin 03/14/2008   in situ-left upper outer forehead-medial (CX35FU)   Squamous cell carcinoma of skin 03/14/2008   in situ-crown of scalp (Cx35FU)   Squamous cell carcinoma of skin 04/15/2011   in situ-right dorsal forearm (txpbx)   Squamous cell carcinoma of skin 10/16/2011   in situ-left sideburn   Squamous cell carcinoma of skin 03/11/2015   ka-left sideburn (CX35FU)   Squamous cell carcinoma of skin 03/11/2015   ka-left forearm (CX35FU)   Squamous cell carcinoma of skin 06/22/2018   in situ-left forearm, sup (txpbx)   Squamous cell carcinoma of skin 05/01/2019   in situ-mid anterior scalp    Squamous cell carcinoma of skin 05/01/2019   in situ-right upper arm     Allergies No Known Allergies   Medications  Current Outpatient Medications:    acetaminophen (TYLENOL) 325 MG tablet, Take 2 tablets (650 mg total) by mouth every 6 (six) hours as needed for mild pain (or Fever >/= 101)., Disp: , Rfl:    amiodarone (PACERONE) 200 MG tablet, Take 0.5 tablets (100 mg total) by mouth daily., Disp: 45 tablet, Rfl: 3   apixaban (ELIQUIS) 2.5 MG TABS tablet, Take 1 tablet (2.5 mg  total) by mouth 2 (two) times daily., Disp: 180 tablet, Rfl: 1   atorvastatin (LIPITOR) 80 MG tablet, Take 1 tablet (80 mg total) by mouth daily., Disp: 90 tablet, Rfl: 1   empagliflozin (JARDIANCE) 10 MG TABS tablet, Take 1 tablet (10 mg total) by mouth daily before breakfast., Disp: 90 tablet, Rfl: 3   finasteride (PROSCAR) 5 MG tablet, Take 5 mg by mouth daily., Disp: , Rfl:    furosemide (LASIX) 40 MG tablet, Take 0.5 tablets (20 mg total) by mouth as needed (For weight gain of 3 lbs in 24 hours or 5 lbs in a week)., Disp: 30 tablet, Rfl: 8   linagliptin (TRADJENTA) 5 MG TABS tablet, Take 1 tablet (5 mg total) by mouth daily., Disp: 90 tablet, Rfl: 1   LORazepam (ATIVAN) 0.5 MG tablet, TAKE 1 TABLET BY MOUTH  TWICE A DAY AS NEEDED FOR ANXIETY, Disp: 30 tablet, Rfl: 0   metoprolol succinate (TOPROL-XL) 25 MG 24 hr tablet, Take 1 tablet (25 mg total) by mouth daily. Take with or immediately following a meal., Disp: 90 tablet, Rfl: 3   neomycin-polymyxin-hydrocortisone (CORTISPORIN) OTIC solution, Place 3 drops into the right ear 4 (four) times daily. (Patient not taking: Reported on 05/26/2023), Disp: 10 mL, Rfl: 0   nitroGLYCERIN (NITROSTAT) 0.4 MG SL tablet, Place 1 tablet (0.4 mg total) under the tongue every 5 (five) minutes x 3 doses as needed for chest pain., Disp: 25 tablet, Rfl: 12   oxymetazoline (AFRIN) 0.05 % nasal spray, Place 1 spray into both nostrils as needed for congestion., Disp: , Rfl:    spironolactone (ALDACTONE) 25 MG tablet, Take 1 tablet (25 mg total) by mouth at bedtime., Disp: 90 tablet, Rfl: 1   traZODone (DESYREL) 50 MG tablet, TAKE 0.5 TABLETS BY MOUTH AT BEDTIME AS NEEDED FOR SLEEP., Disp: 45 tablet, Rfl: 3   Review of Systems Review of Systems  Constitutional:  Negative for chills and fever.  Eyes:  Negative for blurred vision and double vision.  Respiratory:  Negative for cough, hemoptysis and shortness of breath.   Cardiovascular:  Negative for chest pain,  palpitations and leg swelling.  Gastrointestinal:  Negative for abdominal pain, blood in stool, constipation, diarrhea, heartburn, melena, nausea and vomiting.  Genitourinary:  Negative for frequency and hematuria.       Weak stream  Musculoskeletal:  Negative for myalgias.  Skin:  Negative for itching and rash.  Neurological:  Negative for dizziness, weakness and headaches.  Endo/Heme/Allergies:  Negative for polydipsia.       Objective:    Vitals BP 118/70 (BP Location: Left Arm, Patient Position: Sitting, Cuff Size: Normal)   Pulse 70   Temp (!) 97.3 F (36.3 C)   Resp 18   Ht 5\' 10"  (1.778 m)   Wt 168 lb 6 oz (76.4 kg)   SpO2 98%   BMI 24.16 kg/m    Physical Examination Physical Exam Constitutional:      Appearance: Normal appearance. He is not ill-appearing.  Cardiovascular:     Rate and Rhythm: Normal rate and regular rhythm.     Pulses: Normal pulses.     Heart sounds: No murmur heard.    No friction rub. No gallop.  Pulmonary:     Effort: Pulmonary effort is normal. No respiratory distress.     Breath sounds: No wheezing, rhonchi or rales.  Abdominal:     General: Abdomen is flat. Bowel sounds are normal. There is no distension.     Palpations: Abdomen is soft.     Tenderness: There is no abdominal tenderness.  Musculoskeletal:     Right lower leg: No edema.     Left lower leg: No edema.  Skin:    General: Skin is warm and dry.     Findings: No rash.  Neurological:     General: No focal deficit present.     Mental Status: He is alert and oriented to person, place, and time.  Psychiatric:        Mood and Affect: Mood normal.        Behavior: Behavior normal.        Assessment & Plan:   Paroxysmal atrial fibrillation (HCC) His A Fib is currently rate controlled.  We will continue on her current meds and continue on eliquis.  Essential hypertension His BP is currently controlled.  Continue on his current meds.  Coronary artery disease involving  native coronary artery of native heart without angina pectoris His cardiology notes where reviewed.  He currently denies any angina.  Continue on eliquis.  Type 2 diabetes mellitus with complication, without long-term current use of insulin (HCC) We will check his HgBA1c on his next visit.  Continue his current meds.  Stage 3b chronic kidney disease (HCC) He is on jardiance.  He is to avoid NSAIDS.    Return in about 3 months (around 10/13/2023).   Crist Fat, MD

## 2023-07-15 NOTE — Assessment & Plan Note (Signed)
We will check his HgBA1c on his next visit.  Continue his current meds.

## 2023-07-15 NOTE — Assessment & Plan Note (Signed)
His BP is currently controlled.  Continue on his current meds.

## 2023-07-15 NOTE — Assessment & Plan Note (Signed)
His cardiology notes where reviewed.  He currently denies any angina.  Continue on eliquis.

## 2023-07-15 NOTE — Assessment & Plan Note (Signed)
He is on jardiance.  He is to avoid NSAIDS.

## 2023-07-15 NOTE — Assessment & Plan Note (Signed)
His A Fib is currently rate controlled.  We will continue on her current meds and continue on eliquis.

## 2023-09-23 ENCOUNTER — Other Ambulatory Visit: Payer: Self-pay | Admitting: Urology

## 2023-09-23 ENCOUNTER — Telehealth: Payer: Self-pay | Admitting: Cardiology

## 2023-09-23 NOTE — Telephone Encounter (Signed)
   Pre-operative Risk Assessment    Patient Name: Antonio Clark  DOB: 1936/06/16 MRN: [id number]      Request for Surgical Clearance    Procedure:   urethral dialation  Date of Surgery:  Clearance 10/07/23                                 Surgeon:  Estrellita Ludwig Surgeon's Group or Practice Name:  [facility] Phone number:  [phone] Fax number:  [phone]   Type of Clearance Requested:   - Medical and pharmacy hold of Eliquis x 2 days   Type of Anesthesia:  MAC   Additional requests/questions:   None  Signed, April L Harrington   09/23/2023, 4:33 PM

## 2023-09-24 NOTE — Telephone Encounter (Signed)
Called patient to set up an office visit for a pre-op.

## 2023-09-24 NOTE — Telephone Encounter (Signed)
   Name: GABRIELE LOVELAND  DOB: 1936-05-27  MRN: [id number]  Primary Cardiologist: None  Chart reviewed as part of pre-operative protocol coverage. Because of Jonas Goh Radilla's past medical history and time since last visit, he will require a follow-up in-office visit in order to better assess preoperative cardiovascular risk.  Pre-op covering staff: - Please schedule appointment and call patient to inform them. If patient already had an upcoming appointment within acceptable timeframe, please add "pre-op clearance" to the appointment notes so provider is aware. - Please contact requesting surgeon's office via preferred method (i.e, phone, fax) to inform them of need for appointment prior to surgery.    Napoleon Form, Leodis Rains, NP  09/24/2023, 8:42 AM

## 2023-09-27 NOTE — Telephone Encounter (Signed)
 Patient with diagnosis of A fib on Eliquis for anticoagulation.    Procedure: urethral dilation Date of procedure: 10/07/23   CHA2DS2-VASc Score = 6  This indicates a 9.7% annual risk of stroke. The patient's score is based upon: CHF History: 1 HTN History: 1 Diabetes History: 1 Stroke History: 0 Vascular Disease History: 1 Age Score: 2 Gender Score: 0   CrCl 26 ml/min Platelet count 142K   Per office protocol, patient can hold Eliquis for 2 days prior to procedure.   .  **This guidance is not considered finalized until pre-operative APP has relayed final recommendations.**

## 2023-09-30 NOTE — Progress Notes (Addendum)
 COVID Vaccine Completed: yes  Date of COVID positive in last 90 days: no  PCP - Crist Fat, MD LOV 07/15/23 Cardiologist - [name], MD  Cardiac clearance appointment 10/04/23 at 1330  Chest x-ray - 06/02/23 Epic EKG - 06/02/23 Epic Stress Test - 2013 ECHO - 07/22/22 Epic Cardiac Cath - 02/11/22 Epic Pacemaker/ICD device last checked: n/a Spinal Cord Stimulator:n/a  Bowel Prep - no  Sleep Study - yes CPAP - no longer, did not like mask   Fasting Blood Sugar -  Checks Blood Sugar  does not check at home  Last dose of GLP1 agonist-  N/A GLP1 instructions:  Hold 7 days before surgery    Last dose of SGLT-2 inhibitors-  N/A SGLT-2 instructions:  Hold 3 days before surgery    Blood Thinner Instructions:  Eliquis, hold 2 days Aspirin Instructions: Last Dose: 10/04/23 2000  Activity level: Can go up a flight of stairs and perform activities of daily living without stopping and without symptoms of chest pain or shortness of breath. Ambulates with walker for safety   Anesthesia review: CHF, DM2, CAD, a fib, CKD3, OSA, need cardiac clearance   Patient denies shortness of breath, fever, cough and chest pain at PAT appointment  Patient verbalized understanding of instructions that were given to them at the PAT appointment. Patient was also instructed that they will need to review over the PAT instructions again at home before surgery.

## 2023-09-30 NOTE — Patient Instructions (Addendum)
 SURGICAL WAITING ROOM VISITATION  Patients having surgery or a procedure may have no more than 2 support people in the waiting area - these visitors may rotate.    Children under the age of 81 must have an adult with them who is not the patient.  Due to an increase in RSV and influenza rates and associated hospitalizations, children ages 74 and under may not visit patients in [facility] hospitals.  Visitors with respiratory illnesses are discouraged from visiting and should remain at home.  If the patient needs to stay at the hospital during part of their recovery, the visitor guidelines for inpatient rooms apply. Pre-op nurse will coordinate an appropriate time for 1 support person to accompany patient in pre-op.  This support person may not rotate.    Please refer to the [facility] website for the visitor guidelines for Inpatients (after your surgery is over and you are in a regular room).    Your procedure is scheduled on: 10/07/23   Report to [facility] Main Entrance    Report to admitting at 7:15 AM   Call this number if you have problems the morning of surgery [phone]   Do not eat food or drink liquids :After Midnight.          If you have questions, please contact your surgeon's office.   FOLLOW BOWEL PREP AND ANY ADDITIONAL PRE OP INSTRUCTIONS YOU RECEIVED FROM YOUR SURGEON'S OFFICE!!!     Oral Hygiene is also important to reduce your risk of infection.                                    Remember - BRUSH YOUR TEETH THE MORNING OF SURGERY WITH YOUR REGULAR TOOTHPASTE  DENTURES WILL BE REMOVED PRIOR TO SURGERY PLEASE DO NOT APPLY "Poly grip" OR ADHESIVES!!!   Stop all vitamins and herbal supplements 7 days before surgery.   Take these medicines the morning of surgery with A SIP OF WATER: Tylenol, Amiodarone, Finasteride, Metoprolol   These are anesthesia recommendations for holding your anticoagulants.  Please contact your prescribing physician to confirm  IF it is safe to hold your anticoagulants for this length of time.   Eliquis Apixaban   72 hours   Xarelto Rivaroxaban   72 hours  Plavix Clopidogrel   120 hours  Pletal Cilostazol   120 hours    DO NOT TAKE ANY ORAL DIABETIC MEDICATIONS DAY OF YOUR SURGERY  How to Manage Your Diabetes Before and After Surgery  Why is it important to control my blood sugar before and after surgery? Improving blood sugar levels before and after surgery helps healing and can limit problems. A way of improving blood sugar control is eating a healthy diet by:  Eating less sugar and carbohydrates  Increasing activity/exercise  Talking with your doctor about reaching your blood sugar goals High blood sugars (greater than 180 mg/dL) can raise your risk of infections and slow your recovery, so you will need to focus on controlling your diabetes during the weeks before surgery. Make sure that the doctor who takes care of your diabetes knows about your planned surgery including the date and location.  How do I manage my blood sugar before surgery? Check your blood sugar at least 4 times a day, starting 2 days before surgery, to make sure that the level is not too high or low. Check your blood sugar the morning of  your surgery when you wake up and every 2 hours until you get to the Short Stay unit. If your blood sugar is less than 70 mg/dL, you will need to treat for low blood sugar: Do not take insulin. Treat a low blood sugar (less than 70 mg/dL) with  cup of clear juice (cranberry or apple), 4 glucose tablets, OR glucose gel. Recheck blood sugar in 15 minutes after treatment (to make sure it is greater than 70 mg/dL). If your blood sugar is not greater than 70 mg/dL on recheck, call [phone] for further instructions. Report your blood sugar to the short stay nurse when you get to Short Stay.  If you are admitted to the hospital after surgery: Your blood sugar will be checked by the staff and you will  probably be given insulin after surgery (instead of oral diabetes medicines) to make sure you have good blood sugar levels. The goal for blood sugar control after surgery is 80-180 mg/dL.   WHAT DO I DO ABOUT MY DIABETES MEDICATION?  Do not take oral diabetes medicines (pills) the morning of surgery.  THE DAY BEFORE SURGERY, take Metformin as prescribed. Do not take Tradjenta.     THE MORNING OF SURGERY, do not take Metformin or Tradjenta.  Reviewed and Endorsed by [facility] Patient Education Committee, August 2015                              You may not have any metal on your body including jewelry, and body piercing             Do not wear lotions, powders, cologne, or deodorant              Men may shave face and neck.   Do not bring valuables to the hospital. [place] IS NOT             RESPONSIBLE   FOR VALUABLES.   Contacts, glasses, dentures or bridgework may not be worn into surgery.  DO NOT BRING YOUR HOME MEDICATIONS TO THE HOSPITAL. PHARMACY WILL DISPENSE MEDICATIONS LISTED ON YOUR MEDICATION LIST TO YOU DURING YOUR ADMISSION IN THE HOSPITAL!    Patients discharged on the day of surgery will not be allowed to drive home.  Someone NEEDS to stay with you for the first 24 hours after anesthesia.   Special Instructions: Bring a copy of your healthcare power of attorney and living will documents the day of surgery if you haven't scanned them before.              Please read over the following fact sheets you were given: IF YOU HAVE QUESTIONS ABOUT YOUR PRE-OP INSTRUCTIONS PLEASE CALL [name]   If you received a COVID test during your pre-op visit  it is requested that you wear a mask when out in public, stay away from anyone that may not be feeling well and notify your surgeon if you develop symptoms. If you test positive for Covid or have been in contact with anyone that has tested positive in the last 10 days please notify you surgeon.     -  Preparing for Surgery Before surgery, you can play an important role.  Because skin is not sterile, your skin needs to be as free of germs as possible.  You can reduce the number of germs on your skin by washing with CHG (chlorahexidine gluconate) soap before surgery.  CHG is  an antiseptic cleaner which kills germs and bonds with the skin to continue killing germs even after washing. Please DO NOT use if you have an allergy to CHG or antibacterial soaps.  If your skin becomes reddened/irritated stop using the CHG and inform your nurse when you arrive at Short Stay. Do not shave (including legs and underarms) for at least 48 hours prior to the first CHG shower.  You may shave your face/neck.  Please follow these instructions carefully:  1.  Shower with CHG Soap the night before surgery and the  morning of surgery.  2.  If you choose to wash your hair, wash your hair first as usual with your normal  shampoo.  3.  After you shampoo, rinse your hair and body thoroughly to remove the shampoo.                             4.  Use CHG as you would any other liquid soap.  You can apply chg directly to the skin and wash.  Gently with a scrungie or clean washcloth.  5.  Apply the CHG Soap to your body ONLY FROM THE NECK DOWN.   Do   not use on face/ open                           Wound or open sores. Avoid contact with eyes, ears mouth and   genitals (private parts).                       Wash face,  Genitals (private parts) with your normal soap.             6.  Wash thoroughly, paying special attention to the area where your    surgery  will be performed.  7.  Thoroughly rinse your body with warm water from the neck down.  8.  DO NOT shower/wash with your normal soap after using and rinsing off the CHG Soap.                9.  Pat yourself dry with a clean towel.            10.  Wear clean pajamas.            11.  Place clean sheets on your bed the night of your first shower and do not  sleep with  pets. Day of Surgery : Do not apply any lotions/deodorants the morning of surgery.  Please wear clean clothes to the hospital/surgery center.  FAILURE TO FOLLOW THESE INSTRUCTIONS MAY RESULT IN THE CANCELLATION OF YOUR SURGERY  PATIENT SIGNATURE_________________________________  NURSE SIGNATURE__________________________________  ________________________________________________________________________

## 2023-10-01 NOTE — H&P (Signed)
 CC/HPI: cc: Weak urinary stream, history of prostate cancer, urethral stricture   05/05/2023: 88 year old man with a history of prostate cancer treated brachytherapy in 2010 also with a history of urethral stricture requiring multiple urethral dilations. He was previously followed by Dr. Earlene Plater and then Dr. Jerrel Ivory. He does have an IPP for ED. Patient is on Eliquis for A-fib. His last urethral dilation was about 7 or 8 years ago. He was given tamsulosin to try however had increased urinary leakage with this.    IPSS: 2, 1, 2, 2, 4, 2, 2=15/35  Mixed QOL 3/6   06/11/2023: 88 year old man with a history of prostate cancer s/p brachytherapy in 2010 also with a history of urethral stricture now POD#3 s/p Optilume urethral balloon dilation here for void trial.   07/20/2023: Antonio Clark is an 88 year old man who underwent a Optilume urethral balloon dilation due to urethral stricture. He passed his voiding trial. He reports that since the catheter came out, he had a week that was good but then he developed splaying of his urinary stream and a weak urinary stream. He goes about every 2-3 hours and sometimes has difficulty holding it. He does endorse urinary leakage. He does not feel that his stream is strong and it remains very weak. He does get up to urinate a few times at night as well. He denies gross hematuria, dysuria, fevers and chills. His PVR today is 34 mL.   08/23/2023: 88 year old man with a history of prostate cancer treated with brachytherapy in 2010 also with history of bulbar urethral stricture requiring multiple urethral dilations. He underwent Optilume urethral dilation in September 2025. He had improved stream for approximately 1 week and then symptoms returned to baseline which is spraying, slow stream and incomplete emptying. He also has occasional incontinence. PVR today is 90 cc and urinalysis is normal.     ALLERGIES: No Allergies    MEDICATIONS: Finasteride 5 mg tablet  Metoprolol  Succinate 50 mg tablet, extended release 24 hr  Amiodarone Hcl 200 mg tablet  Eliquis 5 mg tablet  Furosemide 20 mg tablet  Jardiance 10 mg tablet  Spironolactone 25 mg tablet  Tradjenta 5 mg tablet     GU PSH: Cystoscopy VIU - 2010, 2009 ESWL - 2009 TRANSPERI NEEDLE PLACE, PROS - 2010       PSH Notes: Surgery Prostate Transperineal Placement Of Needles, Cystoscopy With Internal Urethrotomy, Direct Vision, Needle/Cath Placement For Brachyther Applic Into Genitalia, Cystoscopy With Internal Urethrotomy, Direct Vision, Lithotripsy, Neck Surgery   NON-GU PSH: Neck Surgery (Unspecified) Place Needles Pelvic For Rt - 2010 Visit Complexity (formerly GPC1X) - 05/05/2023     GU PMH: 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/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, Bladder neck obstruction, - 2014 Weak Urinary Stream - 07/20/2023, - 05/05/2023 Bulbar urethral stricture - 06/11/2023 History of prostate cancer - 06/11/2023, - 05/05/2023 History of urolithiasis - 05/05/2023, Nephrolithiasis, - 2014 ED due to arterial insufficiency, Erectile dysfunction due to arterial insufficiency - 2014 Elevated PSA, PSA,Elevated - 2014 Oth GU systems Signs/Symptoms, Bladder pain - 2014 Personal Hx Oth Urinary System diseases, History of hematuria - 2014 Prostate Cancer, Prostate cancer - 2014 Prostate nodule w/o LUTS, Prostate Hard Area Or Nodule - 2014 Renal cyst, Renal cyst, acquired - 2014 Urethral Stricture, Unspec, Urethral stricture - 2014      PMH Notes:  2013-06-03 05:08:13 - Note: Cholelithiasis, unspecified acuity, unspecified acuity  1898-08-10 00:00:00 - Note: Normal Routine History And Physical Senior Citizen (65-80)   NON-GU PMH: Muscle weakness (generalized), Muscle weakness -  2014 Other lack of coordination, Other lack of coordination - 2014 Other muscle spasm, Muscle spasm - 2014 Personal history of other diseases of the circulatory system, History of hypertension - 2014 Personal history of other diseases of the  digestive system, History of esophageal reflux - 2014 Anxiety Arthritis Atrial Fibrillation Diabetes Type 2 Heart disease, unspecified Hypercholesterolemia Hypertension Other heart failure Sleep Apnea    FAMILY HISTORY: Cancer - Mother Heart Disease - Father, Brother Kidney Stones - Runs In Family   SOCIAL HISTORY: Marital Status: Single Preferred Language: English; Ethnicity: Not Hispanic Or Latino; Race: Antonio Clark Current Smoking Status: Patient has never smoked.   Tobacco Use Assessment Completed: Used Tobacco in last 30 days? Does not drink anymore.  Does not drink caffeine.     Notes: Activities Of Daily Living, Living Independently With Spouse, Self-reliant In Usual Daily Activities, Exercise Habits, Caffeine Use, Tobacco Use, Alcohol Use, Marital History - Single, Occupation:   REVIEW OF SYSTEMS:    GU Review Male:   Patient denies frequent urination, hard to postpone urination, burning/ pain with urination, get up at night to urinate, leakage of urine, stream starts and stops, trouble starting your stream, have to strain to urinate , erection problems, and penile pain.  Gastrointestinal (Upper):   Patient denies nausea, vomiting, and indigestion/ heartburn.  Gastrointestinal (Lower):   Patient denies diarrhea and constipation.  Constitutional:   Patient denies fever, night sweats, weight loss, and fatigue.  Skin:   Patient denies skin rash/ lesion and itching.  Eyes:   Patient denies blurred vision and double vision.  Ears/ Nose/ Throat:   Patient denies sore throat and sinus problems.  Hematologic/Lymphatic:   Patient denies swollen glands and easy bruising.  Cardiovascular:   Patient denies leg swelling and chest pains.  Respiratory:   Patient denies cough and shortness of breath.  Endocrine:   Patient denies excessive thirst.  Musculoskeletal:   Patient denies back pain and joint pain.  Neurological:   Patient denies headaches and dizziness.  Psychologic:   Patient  denies depression and anxiety.   VITAL SIGNS: None   MULTI-SYSTEM PHYSICAL EXAMINATION:    Constitutional: Well-nourished. No physical deformities. Normally developed. Good grooming.  Neck: Neck symmetrical, not swollen. Normal tracheal position.  Respiratory: No labored breathing, no use of accessory muscles.   Skin: No paleness, no jaundice, no cyanosis. No lesion, no ulcer, no rash.  Neurologic / Psychiatric: Oriented to time, oriented to place, oriented to person. No depression, no anxiety, no agitation.  Eyes: Normal conjunctivae. Normal eyelids.  Ears, Nose, Mouth, and Throat: Left ear no scars, no lesions, no masses. Right ear no scars, no lesions, no masses. Nose no scars, no lesions, no masses. Normal hearing. Normal lips.  Musculoskeletal: Normal gait and station of head and neck.     Complexity of Data:  Records Review:   Previous Patient Records, POC Tool  Urine Test Review:   Urinalysis  Urodynamics Review:   Review Bladder Scan   05/05/23 10/10/09 05/23/09 09/28/08 05/04/08 03/02/08 06/03/07 12/02/06  PSA  Total PSA 0.015 ng/mL 0.50  0.93  1.88  4.09  5.35  3.34  3.47   Free PSA     0.53  0.60     % Free PSA     13.0  11.2       07/02/04  Hormones  Testosterone, Total 3.96     PROCEDURES:         PVR Ultrasound - 78295  Scanned Volume: 90 cc  Visit Complexity - G2211          Urinalysis Dipstick Dipstick Cont'd  Color: Yellow Bilirubin: Neg mg/dL  Appearance: Clear Ketones: Neg mg/dL  Specific Gravity: 4.098 Blood: Neg ery/uL  pH: <=5.0 Protein: Neg mg/dL  Glucose: 3+ mg/dL Urobilinogen: 0.2 mg/dL    Nitrites: Neg    Leukocyte Esterase: Neg leu/uL    ASSESSMENT:      ICD-10 Details  1 GU:   Bulbar urethral stricture - N35.011 Chronic, Stable  2   History of prostate cancer - Z85.46 Chronic, Stable   PLAN:           Document Letter(s):  Created for Patient: Clinical Summary         Notes:   1. LUTS: Sounds like patient's urethral  stricture has recurred. I offered cystoscopy in the office today but patient would like to do everything under sedation at 1 time. We discussed moving forward with cystoscopy, possible DVIU versus repeat balloon dilation. Risks and benefits include but are not limited to bleeding, infection, recurrence, damage to surrounding structures, need for additional treatment.

## 2023-10-03 NOTE — Progress Notes (Unsigned)
 Cardiology Office Note    Date:  10/04/2023  ID:  Antonio, Clark 09-28-1935, MRN [id number] PCP:  Crist Fat, MD  Cardiologist:  Donato Schultz, MD  Electrophysiologist:  None   Chief Complaint: Preoperative cardiac evaluation   History of Present Illness: .    Antonio Clark is a 88 y.o. male with visit-pertinent history of hypertension, hyperlipidemia, CAD, atrial fibrillation and CHF.  Patient had history of atrial fibrillation, initially diagnosed in 2019.  In 04/2020 he had an atrial fibrillation ablation.  In 02/2022 he was admitted with CHF exacerbation and was found to be in atrial fibrillation with RVR.  Echo during that admission indicated an EF of 25 to 30%, mild LV dilation, RV normal, moderate biatrial enlargement.  Patient underwent left and right heart catheterization which showed elevated PCWP and 70% left main stenosis, by IVUS the left main stenosis did not appear significant so PCI was not done.  Patient then underwent TEE guided DCCV.   Patient was last seen by Dr. Shirlee Latch on 07/22/2022.  Echocardiogram done that day indicated EF of 40 to 45%, mild global hypokinesis, mild LVH, mildly decreased RV systolic function.  Patient reported at the time he was feeling better overall and was less fatigued.  He remained stable from a cardiac standpoint.  It was felt that his cardiomyopathy was nonischemic and possibly tachycardia mediated.  Today he presents for preoperative cardiac evaluation for urethral dilation on 10/07/2023. He reports that he is doing well. He recently moved into independent living. He denies chest pain, shortness of breath, lower extremity edema, orthopnea or pnd. He denies palpitations. He remains active at his independent living facility, patient reports that he refuses to use the elevator, will always take the stairs and tolerates this well.   Labwork independently reviewed: 06/08/2023: Hemoglobin 13.3, hematocrit 39, creatinine 2.20 10/04/2023: Sodium 140,  potassium 4.5, creatinine 1.49, hemoglobin 13, hematocrit 41.7 ROS: .   Today he denies chest pain, shortness of breath, lower extremity edema, fatigue, palpitations, melena, hematuria, hemoptysis, diaphoresis, weakness, presyncope, syncope, orthopnea, and PND.  All other systems are reviewed and otherwise negative. Studies Reviewed: Marland Kitchen    EKG:  EKG is ordered today, personally reviewed, demonstrating  EKG Interpretation Date/Time:  Monday October 04 2023 13:55:38 EST Ventricular Rate:  58 PR Interval:  198 QRS Duration:  92 QT Interval:  432 QTC Calculation: 424 R Axis:   -4  Text Interpretation: Sinus bradycardia Low voltage QRS Confirmed by Reather Littler [phone]) on 10/04/2023 2:08:04 PM    CV Studies:  Cardiac Studies & Procedures   ______________________________________________________________________________________________ CARDIAC CATHETERIZATION  CARDIAC CATHETERIZATION 02/13/2022  Narrative Intravascular ultrasound demonstrates moderate eccentric calcific left main stenosis with a minimal lumen area of approximately 12 mm.  PCI is deferred.  IVUS findings: There is concentric LAD calcification with no stenosis.  There is mild nonobstructive plaquing in the distal and midportion of the left main.  There is moderate eccentric calcified stenosis at the ostium of the left main with a minimal lumen area of 12 mm.  Recommendations: Discontinue clopidogrel Start amiodarone to control heart rate and facilitate cardioversion in this patient with severe LV dysfunction and atrial fibrillation Consider TEE cardioversion Monday if patient remains in atrial fibrillation Resume heparin in 6 hours  Findings Coronary Findings Diagnostic  Dominance: Right  Left Main Vessel is large. Ost LM to Mid LM lesion is 70% stenosed. The lesion is focal and eccentric. The lesion is mildly calcified.  Left Anterior Descending  Vessel is moderate in size. Prox LAD to Mid LAD lesion is 20%  stenosed. The lesion is moderately calcified.  First Diagonal Branch Vessel is small in size.  Second Diagonal Branch Vessel is moderate in size.  Left Circumflex Vessel is moderate in size. Vessel is angiographically normal.  First Obtuse Marginal Branch Vessel is small in size.  Second Obtuse Marginal Branch Vessel is small in size.  Third Obtuse Marginal Branch Vessel is moderate in size.  Right Coronary Artery Vessel is moderate in size. The vessel exhibits minimal luminal irregularities.  Right Posterior Descending Artery Vessel is moderate in size.  Right Posterior Atrioventricular Artery Vessel is moderate in size.  First Right Posterolateral Branch Vessel is small in size.  Second Right Posterolateral Branch Vessel is small in size.  Third Right Posterolateral Branch Vessel is small in size.  Intervention  No interventions have been documented.   CARDIAC CATHETERIZATION  CARDIAC CATHETERIZATION 02/11/2022  Narrative Conclusions: Severe ostial/proximal LMCA disease with focal, eccentric 70% stenosis.  There is also 20% proximal/mid LAD stenosis as well as minimal luminal irregularities in the RCA. Mildly elevated left heart filling pressures (PCWP/LVEDP 20 mmHg). Mild pulmonary hypertension (mean PAP 31 mmHg). Mildly reduced Fick cardiac output/index (CO 4.7 L/min, CI 2.4 L/min/m^2). Relatively small left radial artery by ultrasound with significant vasospasm.  Recommend alternative access for PCI.  Recommendations: Images reviewed with Dr. [place]; we will plan to transfer [name] to [facility] for revascularization of ostial/proximal LMCA stenosis.  The lesion appears well-suited to stenting; I would favor IVUS-guided PCI over CABG in the setting of low Syntax score, advanced age, and other comorbidities. Continue diuresis and optimization of GDMT for acute HFrEF. Aggressive secondary prevention of coronary artery disease.  Yvonne Kendall, MD [facility]  [facility]  Findings Coronary Findings Diagnostic  Dominance: Right  Left Main Vessel is large. Ost LM to Mid LM lesion is 70% stenosed. The lesion is focal and eccentric. The lesion is mildly calcified.  Left Anterior Descending Vessel is moderate in size. Prox LAD to Mid LAD lesion is 20% stenosed. The lesion is moderately calcified.  First Diagonal Branch Vessel is small in size.  Second Diagonal Branch Vessel is moderate in size.  Left Circumflex Vessel is moderate in size. Vessel is angiographically normal.  First Obtuse Marginal Branch Vessel is small in size.  Second Obtuse Marginal Branch Vessel is small in size.  Third Obtuse Marginal Branch Vessel is moderate in size.  Right Coronary Artery Vessel is moderate in size. The vessel exhibits minimal luminal irregularities.  Right Posterior Descending Artery Vessel is moderate in size.  Right Posterior Atrioventricular Artery Vessel is moderate in size.  First Right Posterolateral Branch Vessel is small in size.  Second Right Posterolateral Branch Vessel is small in size.  Third Right Posterolateral Branch Vessel is small in size.  Intervention  No interventions have been documented.     ECHOCARDIOGRAM  ECHOCARDIOGRAM COMPLETE 07/22/2022  Narrative ECHOCARDIOGRAM REPORT    Patient Name:   Antonio Clark Scroggin Date of Exam: 07/22/2022 Medical Rec #:  [id number]     Height:       68.5 in Accession #:    [id number]    Weight:       169.4 lb Date of Birth:  01/14/1936     BSA:          1.915 m Patient Age:    86 years      BP:  134/69 mmHg Patient Gender: M             HR:           61 bpm. Exam Location:  Outpatient  Procedure: 2D Echo, Cardiac Doppler and Color Doppler  Indications:    CHF  History:        Patient has prior history of Echocardiogram examinations, most recent 02/17/2022. CHF, Arrythmias:Atrial Fibrillation; Risk Factors:Dyslipidemia and Diabetes.  Sonographer:     Ross Ludwig RDCS (AE) Referring Phys: [id number]   Sonographer Comments: Suboptimal subcostal window. IMPRESSIONS   1. Left ventricular ejection fraction, by estimation, is 40 to 45%. The left ventricle has mildly decreased function. The left ventricle demonstrates global hypokinesis. There is moderate concentric left ventricular hypertrophy. Left ventricular diastolic parameters are consistent with Grade I diastolic dysfunction (impaired relaxation). 2. Right ventricular systolic function is mildly reduced. The right ventricular size is normal. There is normal pulmonary artery systolic pressure. 3. No evidence of mitral valve regurgitation. 4. Aortic valve regurgitation is not visualized. 5. Aneurysm of the ascending aorta, measuring 45 mm. 6. The inferior vena cava is normal in size with greater than 50% respiratory variability, suggesting right atrial pressure of 3 mmHg.  Conclusion(s)/Recommendation(s): Compared to prior TTE 02/17/2022, EF has improved.  FINDINGS Left Ventricle: Left ventricular ejection fraction, by estimation, is 40 to 45%. The left ventricle has mildly decreased function. The left ventricle demonstrates global hypokinesis. The left ventricular internal cavity size was normal in size. There is moderate concentric left ventricular hypertrophy. Left ventricular diastolic parameters are consistent with Grade I diastolic dysfunction (impaired relaxation).  Right Ventricle: The right ventricular size is normal. Right ventricular systolic function is mildly reduced. There is normal pulmonary artery systolic pressure. The tricuspid regurgitant velocity is 2.09 m/s, and with an assumed right atrial pressure of 3 mmHg, the estimated right ventricular systolic pressure is 20.5 mmHg.  Left Atrium: Left atrial size was normal in size.  Right Atrium: Right atrial size was normal in size.  Pericardium: There is no evidence of pericardial effusion.  Mitral Valve: Mild  mitral annular calcification. No evidence of mitral valve regurgitation.  Tricuspid Valve: Tricuspid valve regurgitation is trivial.  Aortic Valve: Aortic valve regurgitation is not visualized. Aortic regurgitation PHT measures 676 msec. Aortic valve mean gradient measures 1.0 mmHg. Aortic valve peak gradient measures 2.8 mmHg. Aortic valve area, by VTI measures 3.39 cm.  Pulmonic Valve: Pulmonic valve regurgitation is not visualized.  Aorta: There is an aneurysm involving the ascending aorta measuring 45 mm.  Venous: The inferior vena cava is normal in size with greater than 50% respiratory variability, suggesting right atrial pressure of 3 mmHg.   LEFT VENTRICLE PLAX 2D LVIDd:         4.40 cm     Diastology LVIDs:         3.50 cm     LV e' medial:    4.35 cm/s LV PW:         1.30 cm     LV E/e' medial:  11.2 LV IVS:        1.10 cm     LV e' lateral:   8.05 cm/s LVOT diam:     2.30 cm     LV E/e' lateral: 6.1 LV SV:         61 LV SV Index:   32 LVOT Area:     4.15 cm  LV Volumes (MOD) LV vol d, MOD A2C: 87.3 ml  LV vol d, MOD A4C: 78.4 ml LV vol s, MOD A2C: 49.2 ml LV vol s, MOD A4C: 43.5 ml LV SV MOD A2C:     38.1 ml LV SV MOD A4C:     78.4 ml LV SV MOD BP:      39.3 ml  RIGHT VENTRICLE RV Basal diam:  2.90 cm RV S prime:     8.05 cm/s TAPSE (M-mode): 1.3 cm  LEFT ATRIUM             Index        RIGHT ATRIUM           Index LA diam:        3.70 cm 1.93 cm/m   RA Area:     14.00 cm LA Vol (A2C):   33.0 ml 17.23 ml/m  RA Volume:   36.40 ml  19.01 ml/m LA Vol (A4C):   29.0 ml 15.14 ml/m LA Biplane Vol: 31.9 ml 16.66 ml/m AORTIC VALVE AV Area (Vmax):    3.09 cm AV Area (Vmean):   3.21 cm AV Area (VTI):     3.39 cm AV Vmax:           84.20 cm/s AV Vmean:          56.000 cm/s AV VTI:            0.180 m AV Peak Grad:      2.8 mmHg AV Mean Grad:      1.0 mmHg LVOT Vmax:         62.60 cm/s LVOT Vmean:        43.200 cm/s LVOT VTI:          0.147 m LVOT/AV VTI  ratio: 0.82 AI PHT:            676 msec  AORTA Ao Root diam: 3.80 cm Ao Asc diam:  4.50 cm  MITRAL VALVE               TRICUSPID VALVE MV Area (PHT): 1.84 cm    TR Peak grad:   17.5 mmHg MV Decel Time: 412 msec    TR Vmax:        209.00 cm/s MV E velocity: 48.80 cm/s MV A velocity: 59.90 cm/s  SHUNTS MV E/A ratio:  0.81        Systemic VTI:  0.15 m Systemic Diam: 2.30 cm  Carolan Clines Electronically signed by Carolan Clines Signature Date/Time: 07/22/2022/6:53:22 PM    Final   TEE  ECHO TEE 02/17/2022  Narrative TRANSESOPHOGEAL ECHO REPORT    Patient Name:   Antonio Clark Hayton Date of Exam: 02/17/2022 Medical Rec #:  [id number]     Height:       69.0 in Accession #:    [id number]    Weight:       176.9 lb Date of Birth:  01-Aug-1936     BSA:          1.961 m Patient Age:    86 years      BP:           107/87 mmHg Patient Gender: M             HR:           70 bpm. Exam Location:  Inpatient  Procedure: Transesophageal Echo, Cardiac Doppler and Color Doppler  Indications:     Afib  History:         Patient has prior history of Echocardiogram examinations,  most recent 02/09/2022. CHF; Risk Factors:Hypertension and Dyslipidemia.  Sonographer:     Leta Jungling RDCS Referring Phys:  [id number] Veverly Fells SKAINS Diagnosing Phys: Donato Schultz MD  PROCEDURE: After discussion of the risks and benefits of a TEE, an informed consent was obtained from the patient. TEE procedure time was 8 minutes. The transesophogeal probe was passed without difficulty through the esophogus of the patient. Imaged were obtained with the patient in a left lateral decubitus position. Sedation performed by different physician. The patient was monitored while under deep sedation. Anesthestetic sedation was provided intravenously by Anesthesiology: 191.58mg  of Propofol, 100mg  of Lidocaine. Image quality was good. The patient's vital signs; including heart rate, blood pressure, and oxygen saturation; remained stable  throughout the procedure. The patient developed no complications during the procedure. A successful direct current cardioversion was performed at 200 joules with 1 attempt.  IMPRESSIONS   1. Left ventricular ejection fraction, by estimation, is <20%. The left ventricle has severely decreased function. The left ventricle demonstrates global hypokinesis. 2. Right ventricular systolic function is moderately reduced. The right ventricular size is normal. There is normal pulmonary artery systolic pressure. The estimated right ventricular systolic pressure is 30.2 mmHg. 3. Left atrial size was severely dilated. No left atrial/left atrial appendage thrombus was detected. 4. Right atrial size was severely dilated. 5. The mitral valve is normal in structure. Mild to moderate mitral valve regurgitation. No evidence of mitral stenosis. 6. Tricuspid valve regurgitation is severe. 7. The aortic valve is tricuspid. There is mild calcification of the aortic valve. Aortic valve regurgitation is mild. No aortic stenosis is present. 8. There is Moderate (Grade III) plaque. 9. The inferior vena cava is normal in size with greater than 50% respiratory variability, suggesting right atrial pressure of 3 mmHg. 10. Agitated saline contrast bubble study was negative, with no evidence of any interatrial shunt.  Conclusion(s)/Recommendation(s): No LA/LAA thrombus identified. Successful cardioversion performed with restoration of normal sinus rhythm.  FINDINGS Left Ventricle: Left ventricular ejection fraction, by estimation, is <20%. The left ventricle has severely decreased function. The left ventricle demonstrates global hypokinesis. The left ventricular internal cavity size was normal in size. There is no left ventricular hypertrophy.  Right Ventricle: The right ventricular size is normal. No increase in right ventricular wall thickness. Right ventricular systolic function is moderately reduced. There is normal  pulmonary artery systolic pressure. The tricuspid regurgitant velocity is 2.61 m/s, and with an assumed right atrial pressure of 3 mmHg, the estimated right ventricular systolic pressure is 30.2 mmHg.  Left Atrium: Left atrial size was severely dilated. No left atrial/left atrial appendage thrombus was detected.  Right Atrium: Right atrial size was severely dilated.  Pericardium: There is no evidence of pericardial effusion.  Mitral Valve: The mitral valve is normal in structure. Mild to moderate mitral valve regurgitation. No evidence of mitral valve stenosis.  Tricuspid Valve: The tricuspid valve is normal in structure. Tricuspid valve regurgitation is severe. No evidence of tricuspid stenosis.  Aortic Valve: The aortic valve is tricuspid. There is mild calcification of the aortic valve. Aortic valve regurgitation is mild. No aortic stenosis is present.  Pulmonic Valve: The pulmonic valve was normal in structure. Pulmonic valve regurgitation is mild. No evidence of pulmonic stenosis.  Aorta: The aortic root is normal in size and structure. There is moderate (Grade III) plaque.  Venous: The inferior vena cava is normal in size with greater than 50% respiratory variability, suggesting right atrial pressure of 3 mmHg.  IAS/Shunts: No atrial  level shunt detected by color flow Doppler. Agitated saline contrast was given intravenously to evaluate for intracardiac shunting. Agitated saline contrast bubble study was negative, with no evidence of any interatrial shunt. There is no evidence of a patent foramen ovale. There is no evidence of an atrial septal defect.   TRICUSPID VALVE TR Peak grad:   27.2 mmHg TR Vmax:        261.00 cm/s  Donato Schultz MD Electronically signed by Donato Schultz MD Signature Date/Time: 02/17/2022/9:49:50 AM    Final        ______________________________________________________________________________________________      Current Reported Medications:.     Current Meds  Medication Sig   acetaminophen (TYLENOL) 325 MG tablet Take 2 tablets (650 mg total) by mouth every 6 (six) hours as needed for mild pain (or Fever >/= 101).   amiodarone (PACERONE) 100 MG tablet Take 100 mg by mouth daily.   apixaban (ELIQUIS) 2.5 MG TABS tablet Take 1 tablet (2.5 mg total) by mouth 2 (two) times daily.   atorvastatin (LIPITOR) 80 MG tablet Take 1 tablet (80 mg total) by mouth daily. (Patient taking differently: Take 80 mg by mouth at bedtime.)   empagliflozin (JARDIANCE) 10 MG TABS tablet Take 1 tablet (10 mg total) by mouth daily before breakfast.   finasteride (PROSCAR) 5 MG tablet Take 5 mg by mouth daily.   linagliptin (TRADJENTA) 5 MG TABS tablet Take 1 tablet (5 mg total) by mouth daily.   metoprolol succinate (TOPROL-XL) 25 MG 24 hr tablet Take 1 tablet (25 mg total) by mouth daily. Take with or immediately following a meal.   nitroGLYCERIN (NITROSTAT) 0.4 MG SL tablet Place 1 tablet (0.4 mg total) under the tongue every 5 (five) minutes x 3 doses as needed for chest pain.   oxymetazoline (AFRIN) 0.05 % nasal spray Place 1 spray into both nostrils as needed for congestion.   spironolactone (ALDACTONE) 25 MG tablet Take 1 tablet (25 mg total) by mouth at bedtime.   Physical Exam:    VS:  BP 132/68 (BP Location: Left Arm, Patient Position: Sitting, Cuff Size: Normal)   Pulse (!) 58   Ht 5\' 9"  (1.753 m)   Wt 174 lb 12.8 oz (79.3 kg)   SpO2 96%   BMI 25.81 kg/m    Wt Readings from Last 3 Encounters:  10/04/23 174 lb 12.8 oz (79.3 kg)  10/04/23 170 lb (77.1 kg)  07/15/23 168 lb 6 oz (76.4 kg)    GEN: Well nourished, well developed in no acute distress NECK: No JVD; No carotid bruits CARDIAC: RRR, no murmurs, rubs, gallops RESPIRATORY:  Clear to auscultation without rales, wheezing or rhonchi  ABDOMEN: Soft, non-tender, non-distended EXTREMITIES:  No edema; No acute deformity   Asessement and Plan:.    Preoperative cardiac evaluation:  Urethral dialation with Dr. Kasandra Knudsen on 10/07/23. Mr. Cherian perioperative risk of a major cardiac event is 6.6% according to the Revised Cardiac Risk Index (RCRI).  Therefore, he is at high risk for perioperative complications.   His functional capacity is good at 5.81 METs according to the Duke Activity Status Index (DASI).  Discussed with Dr. Anne Fu who agreed patient can proceed without further cardiovascular testing.  Recommendations: According to ACC/AHA guidelines, no further cardiovascular testing needed.  The patient may proceed to surgery at acceptable risk.  Patient currently has echocardiogram ordered for routine monitoring, this should not preclude surgery.  Antiplatelet and/or Anticoagulation Recommendations: Eliquis (Apixaban) can be held for 2 days prior to  surgery.  Please resume post op when felt to be safe.    CAD: Left heart catheterization in 02/2022 indicated 70% left main stenosis, IVUS suggested this was not hemodynamically significant and PCI was not completed. Today he reports that he is doing well, he denies chest pain or shortness of breath. Patient is not on aspirin given need for Eliquis. Heart healthy diet and regular cardiovascular exercise encouraged.  Continue Jardiance 10 mg daily, metoprolol succinate 25 mg daily, spironolactone 25 mg daily.  HFrEF: Echo in 02/2022 indicated EF 25 to 30%, mild LV dilation, RV normal, moderate biatrial enlargement.  LHC as noted above.  Felt to be possibly tachycardia mediated given atrial fibrillation.  It was recommended that we work to keep him in normal sinus rhythm.  Echo in 07/2022 indicated EF 40 to 45%, mild global hypokinesis, mild LVH, mildly decreased RV systolic function. Today he appears euvolemic and well compensated on exam.  Patient denies palpitations, shortness of breath, lower extremity edema, orthopnea or PND.  Will have patient complete echocardiogram prior to visit with Dr. Anne Fu for monitoring, this should not  preclude his procedure as noted above.  Reviewed ED precautions.  Continue Jardiance 10 mg daily, metoprolol succinate 25 mg daily and spironolactone 25 mg daily.  Paroxysmal atrial fibrillation: S/p ablation in 04/2020 and DCCV in 02/2022.  Per Dr. Shirlee Latch patient needs to be kept in sinus rhythm as EF has previously improved in sinus rhythm. EKG today indicates sinus bradycardia at 58 bpm, patient denies palpitations or feeling of increased heart rate. He denies bleeding problems on Eliquis. Continue amiodarone 100 mg daily. Check TSH and LFTs. Continue Eliquis 2.5 mg twice daily. Patient last creatinine 1.49 on 10/04/23, baseline ranging from 1.6-2.20.  Will continue patient on Eliquis 2.5 mg twice daily, continue to monitor.  Hyperlipidemia: Last lipid profile on 05/28/2022 indicated total cholesterol 142, HDL 55, triglycerides 167 and LDL 54. Check fasting lipid profile and LFTs.   CKD: Last creatinine 1.49 on 10/04/23. Monitored and managed per PCP.   OSA: Patient previously unable to tolerate CPAP.    Disposition: F/u with Dr. Anne Fu in 2 months.   Signed, [name], NP

## 2023-10-04 ENCOUNTER — Ambulatory Visit: Payer: Medicare Other | Attending: Cardiology | Admitting: Cardiology

## 2023-10-04 ENCOUNTER — Encounter: Payer: Self-pay | Admitting: Cardiology

## 2023-10-04 ENCOUNTER — Encounter (HOSPITAL_COMMUNITY): Payer: Self-pay

## 2023-10-04 ENCOUNTER — Other Ambulatory Visit: Payer: Self-pay

## 2023-10-04 ENCOUNTER — Encounter (HOSPITAL_COMMUNITY)
Admission: RE | Admit: 2023-10-04 | Discharge: 2023-10-04 | Disposition: A | Discharge status: Home / Self Care | Payer: Medicare Other | Source: Ambulatory Visit | Attending: Urology | Admitting: Urology

## 2023-10-04 VITALS — BP 132/68 | HR 58 | Ht 69.0 in | Wt 174.8 lb

## 2023-10-04 VITALS — BP 98/64 | HR 76 | Temp 97.8°F | Resp 16 | Ht 69.0 in | Wt 170.0 lb

## 2023-10-04 DIAGNOSIS — N183 Chronic kidney disease, stage 3 unspecified: Secondary | ICD-10-CM

## 2023-10-04 DIAGNOSIS — I5022 Chronic systolic (congestive) heart failure: Secondary | ICD-10-CM

## 2023-10-04 DIAGNOSIS — I2510 Atherosclerotic heart disease of native coronary artery without angina pectoris: Secondary | ICD-10-CM | POA: Diagnosis not present

## 2023-10-04 DIAGNOSIS — Z01818 Encounter for other preprocedural examination: Secondary | ICD-10-CM | POA: Diagnosis present

## 2023-10-04 DIAGNOSIS — E119 Type 2 diabetes mellitus without complications: Secondary | ICD-10-CM | POA: Insufficient documentation

## 2023-10-04 DIAGNOSIS — I4819 Other persistent atrial fibrillation: Secondary | ICD-10-CM | POA: Diagnosis not present

## 2023-10-04 DIAGNOSIS — Z01810 Encounter for preprocedural cardiovascular examination: Secondary | ICD-10-CM | POA: Diagnosis not present

## 2023-10-04 DIAGNOSIS — E785 Hyperlipidemia, unspecified: Secondary | ICD-10-CM

## 2023-10-04 DIAGNOSIS — G4733 Obstructive sleep apnea (adult) (pediatric): Secondary | ICD-10-CM

## 2023-10-04 LAB — BASIC METABOLIC PANEL
Anion gap: 8 (ref 5–15)
BUN: 29 mg/dL — ABNORMAL HIGH (ref 8–23)
CO2: 19 mmol/L — ABNORMAL LOW (ref 22–32)
Calcium: 9.3 mg/dL (ref 8.9–10.3)
Chloride: 113 mmol/L — ABNORMAL HIGH (ref 98–111)
Creatinine, Ser: 1.49 mg/dL — ABNORMAL HIGH (ref 0.61–1.24)
GFR, Estimated: 46 mL/min — ABNORMAL LOW (ref >60)
Glucose, Bld: 206 mg/dL — ABNORMAL HIGH (ref 70–99)
Potassium: 4.5 mmol/L (ref 3.5–5.1)
Sodium: 140 mmol/L (ref 135–145)

## 2023-10-04 LAB — HEMOGLOBIN A1C
Hgb A1c MFr Bld: 6.2 % — ABNORMAL HIGH (ref 4.8–5.6)
Mean Plasma Glucose: 131.24 mg/dL

## 2023-10-04 LAB — CBC
HCT: 41.7 % (ref 39.0–52.0)
Hemoglobin: 13 g/dL (ref 13.0–17.0)
MCH: 30.4 pg (ref 26.0–34.0)
MCHC: 31.2 g/dL (ref 30.0–36.0)
MCV: 97.4 fL (ref 80.0–100.0)
Platelets: 136 10*3/uL — ABNORMAL LOW (ref 150–400)
RBC: 4.28 MIL/uL (ref 4.22–5.81)
RDW: 14.4 % (ref 11.5–15.5)
WBC: 6.5 10*3/uL (ref 4.0–10.5)
nRBC: 0 % (ref 0.0–0.2)

## 2023-10-04 LAB — GLUCOSE, CAPILLARY: Glucose-Capillary: 184 mg/dL — ABNORMAL HIGH (ref 70–99)

## 2023-10-04 NOTE — Patient Instructions (Signed)
 Medication Instructions:  No changes *If you need a refill on your cardiac medications before your next appointment, please call your pharmacy*  Lab Work: At your earliest available we will need a fasting lipid panel, LFTs, and TSH If you have labs (blood work) drawn today and your tests are completely normal, you will receive your results only by: MyChart Message (if you have MyChart) OR A paper copy in the mail If you have any lab test that is abnormal or we need to change your treatment, we will call you to review the results.  Testing/Procedures: Your physician has requested that you have an echocardiogram. Echocardiography is a painless test that uses sound waves to create images of your heart. It provides your doctor with information about the size and shape of your heart and how well your heart's chambers and valves are working. This procedure takes approximately one hour. There are no restrictions for this procedure. Please do NOT wear cologne, perfume, aftershave, or lotions (deodorant is allowed). Please arrive 15 minutes prior to your appointment time.  Please note: We ask at that you not bring children with you during ultrasound (echo/ vascular) testing. Due to room size and safety concerns, children are not allowed in the ultrasound rooms during exams. Our front office staff cannot provide observation of children in our lobby area while testing is being conducted. An adult accompanying a patient to their appointment will only be allowed in the ultrasound room at the discretion of the ultrasound technician under special circumstances. We apologize for any inconvenience.  Follow-Up: At [facility], you and your health needs are our priority.  As part of our continuing mission to provide you with exceptional heart care, we have created designated Provider Care Teams.  These Care Teams include your primary Cardiologist (physician) and Advanced Practice Providers (APPs -   Physician Assistants and Nurse Practitioners) who all work together to provide you with the care you need, when you need it.  We recommend signing up for the patient portal called "MyChart".  Sign up information is provided on this After Visit Summary.  MyChart is used to connect with patients for Virtual Visits (Telemedicine).  Patients are able to view lab/test results, encounter notes, upcoming appointments, etc.  Non-urgent messages can be sent to your provider as well.   To learn more about what you can do with MyChart, go to [email or web address].    Your next appointment:   Already scheduled

## 2023-10-05 NOTE — Progress Notes (Signed)
 Anesthesia Chart Review   Case: [id number] Date/Time: 10/07/23 0915   Procedures:      CYSTOSCOPY WITH URETHRAL DILATATION (OPTILUM) - 60 MINUTES     DIRECT VISION INTERNAL URETHROTOMY     RETROGRADE URETHROGRAM   Anesthesia type: Monitor Anesthesia Care   Pre-op diagnosis: URETHRAL STRICTURE   Location: [facility] ROOM 08 / [facility]   Surgeons: Noel Christmas, MD       DISCUSSION:88 y.o. never smoker with h/o HTN, sleep apnea, atrial fibrillation, CAD, CHF, DM II, urethral stricture scheduled for above procedure 10/07/2023 with Dr. Kasandra Knudsen.   Per cardiology preoperative evaluation 10/04/2023, "Preoperative cardiac evaluation: Urethral dialation with Dr. Kasandra Knudsen on 10/07/23. Antonio Clark perioperative risk of a major cardiac event is 6.6% according to the Revised Cardiac Risk Index (RCRI).  Therefore, he is at high risk for perioperative complications.   His functional capacity is good at 5.81 METs according to the Duke Activity Status Index (DASI).  Discussed with Dr. Anne Fu who agreed patient can proceed without further cardiovascular testing.  Recommendations: According to ACC/AHA guidelines, no further cardiovascular testing needed.  The patient may proceed to surgery at acceptable risk.  Patient currently has echocardiogram ordered for routine monitoring, this should not preclude surgery.  Antiplatelet and/or Anticoagulation Recommendations: Eliquis (Apixaban) can be held for 2 days prior to surgery.  Please resume post op when felt to be safe."  VS: BP 98/64   Pulse 76   Temp 36.6 C (Oral)   Resp 16   Ht 5\' 9"  (1.753 m)   Wt 77.1 kg   SpO2 100%   BMI 25.10 kg/m   PROVIDERS: Crist Fat, MD is PCP   Cardiologist:  Donato Schultz, MD  LABS: Labs reviewed: Acceptable for surgery. (all labs ordered are listed, but only abnormal results are displayed)  Labs Reviewed  HEMOGLOBIN A1C - Abnormal; Notable for the following components:      Result Value   Hgb A1c MFr Bld 6.2 (*)     All other components within normal limits  BASIC METABOLIC PANEL - Abnormal; Notable for the following components:   Chloride 113 (*)    CO2 19 (*)    Glucose, Bld 206 (*)    BUN 29 (*)    Creatinine, Ser 1.49 (*)    GFR, Estimated 46 (*)    All other components within normal limits  CBC - Abnormal; Notable for the following components:   Platelets 136 (*)    All other components within normal limits  GLUCOSE, CAPILLARY - Abnormal; Notable for the following components:   Glucose-Capillary 184 (*)    All other components within normal limits     IMAGES:   EKG:   CV: Echo 07/22/2022 1. Left ventricular ejection fraction, by estimation, is 40 to 45%. The  left ventricle has mildly decreased function. The left ventricle  demonstrates global hypokinesis. There is moderate concentric left  ventricular hypertrophy. Left ventricular  diastolic parameters are consistent with Grade I diastolic dysfunction  (impaired relaxation).   2. Right ventricular systolic function is mildly reduced. The right  ventricular size is normal. There is normal pulmonary artery systolic  pressure.   3. No evidence of mitral valve regurgitation.   4. Aortic valve regurgitation is not visualized.   5. Aneurysm of the ascending aorta, measuring 45 mm.   6. The inferior vena cava is normal in size with greater than 50%  respiratory variability, suggesting right atrial pressure of 3 mmHg.  Past  Medical History:  Diagnosis Date   Anal fissure    Arthritis    Atrial fibrillation (HCC)    Back pain    CHF (congestive heart failure) (HCC)    Colon polyps    Coronary artery disease    Diabetes mellitus type 2 with complications (HCC)    Dysrhythmia    a-fib   GERD (gastroesophageal reflux disease)    History of echocardiogram 07/ 07/ 2011   History of kidney stones    History of lithotripsy 1989   Hyperlipidemia    Hypertension    Kidney stones    Melanoma (HCC) 05/01/2019   right chest wall  (12/20)   Prostate CA (HCC) 2010   Sleep apnea    no CPAP   Squamous cell carcinoma of skin 05/23/1992   bowens-left parietal scalp (CX35FU)   Squamous cell carcinoma of skin 03/14/2008   in situ-left upper outer forehead-medial (CX35FU)   Squamous cell carcinoma of skin 03/14/2008   in situ-crown of scalp (Cx35FU)   Squamous cell carcinoma of skin 04/15/2011   in situ-right dorsal forearm (txpbx)   Squamous cell carcinoma of skin 10/16/2011   in situ-left sideburn   Squamous cell carcinoma of skin 03/11/2015   ka-left sideburn (CX35FU)   Squamous cell carcinoma of skin 03/11/2015   ka-left forearm (CX35FU)   Squamous cell carcinoma of skin 06/22/2018   in situ-left forearm, sup (txpbx)   Squamous cell carcinoma of skin 05/01/2019   in situ-mid anterior scalp    Squamous cell carcinoma of skin 05/01/2019   in situ-right upper arm    Past Surgical History:  Procedure Laterality Date   ATRIAL FIBRILLATION ABLATION N/A 04/17/2020   Procedure: ATRIAL FIBRILLATION ABLATION;  Surgeon: Lanier Prude, MD;  Location: [facility];  Service: Cardiovascular;  Laterality: N/A;   AXILLARY SENTINEL NODE BIOPSY Right 08/23/2019   Procedure: SENTINEL LYMPH NODE BIOSPY RIGHT AXILLA;  Surgeon: Almond Lint, MD;  Location: [facility];  Service: General;  Laterality: Right;   CARDIOVERSION N/A 05/10/2020   Procedure: CARDIOVERSION;  Surgeon: Meriam Sprague, MD;  Location: [facility] ENDOSCOPY;  Service: Cardiovascular;  Laterality: N/A;   CARDIOVERSION N/A 05/08/2021   Procedure: CARDIOVERSION;  Surgeon: Little Ishikawa, MD;  Location: [facility] ENDOSCOPY;  Service: Cardiovascular;  Laterality: N/A;   CARDIOVERSION N/A 02/17/2022   Procedure: CARDIOVERSION;  Surgeon: Jake Bathe, MD;  Location: [facility] ENDOSCOPY;  Service: Cardiovascular;  Laterality: N/A;   COLONOSCOPY  2002   CORONARY ANGIOGRAPHY N/A 02/13/2022   Procedure: CORONARY ANGIOGRAPHY;  Surgeon: Tonny Bollman, MD;   Location: [facility] INVASIVE CV LAB;  Service: Cardiovascular;  Laterality: N/A;   CORONARY ULTRASOUND/IVUS N/A 02/13/2022   Procedure: Intravascular Ultrasound/IVUS;  Surgeon: Tonny Bollman, MD;  Location: [facility] INVASIVE CV LAB;  Service: Cardiovascular;  Laterality: N/A;   CYSTOSCOPY WITH URETHRAL DILATATION N/A 06/08/2023   Procedure: DIAGNOSTIC CYSTOSCOPY WITH OPTILUME URETHRAL DILATATION;  Surgeon: Noel Christmas, MD;  Location: [facility];  Service: Urology;  Laterality: N/A;  45 MINUTES   FASCIECTOMY Left 03/28/2019   Procedure: SEGMENTAL FASCIECTOMY LEFT RING FINGER;  Surgeon: Cindee Salt, MD;  Location: [facility];  Service: Orthopedics;  Laterality: Left;  ANESTHESIA  AXILLARY BLOCK   INSERTION PROSTATE RADIATION SEED  8 4 2010   per Dr Earlene Plater   MELANOMA EXCISION Right 06/22/2019   Procedure: WIDE LOCAL EXCISION RIGHT CHEST WALL MELANOMA, ADVANCEMENT FLAP CLOSURE FOR DEFECT 3X6 CM;  Surgeon: Almond Lint, MD;  Location:  [facility];  Service: General;  Laterality: Right;   POLYPECTOMY  2002   RIGHT/LEFT HEART CATH AND CORONARY ANGIOGRAPHY N/A 02/11/2022   Procedure: RIGHT/LEFT HEART CATH AND CORONARY ANGIOGRAPHY;  Surgeon: Yvonne Kendall, MD;  Location: [facility] INVASIVE CV LAB;  Service: Cardiovascular;  Laterality: N/A;   TEE WITHOUT CARDIOVERSION N/A 02/17/2022   Procedure: TRANSESOPHAGEAL ECHOCARDIOGRAM (TEE);  Surgeon: Jake Bathe, MD;  Location: [facility] ENDOSCOPY;  Service: Cardiovascular;  Laterality: N/A;   THROAT SURGERY  1980   benign cyst   UPPER GASTROINTESTINAL ENDOSCOPY     URETHRAL STRICTURE DILATATION     also penile implant    MEDICATIONS:  acetaminophen (TYLENOL) 325 MG tablet   amiodarone (PACERONE) 100 MG tablet   apixaban (ELIQUIS) 2.5 MG TABS tablet   atorvastatin (LIPITOR) 80 MG tablet   empagliflozin (JARDIANCE) 10 MG TABS tablet   finasteride (PROSCAR) 5 MG tablet   linagliptin (TRADJENTA) 5 MG TABS tablet   metoprolol succinate (TOPROL-XL)  25 MG 24 hr tablet   nitroGLYCERIN (NITROSTAT) 0.4 MG SL tablet   oxymetazoline (AFRIN) 0.05 % nasal spray   spironolactone (ALDACTONE) 25 MG tablet   No current facility-administered medications for this encounter.    Jodell Cipro Ward, PA-C [facility] [phone]

## 2023-10-07 ENCOUNTER — Ambulatory Visit (HOSPITAL_BASED_OUTPATIENT_CLINIC_OR_DEPARTMENT_OTHER): Payer: Medicare Other | Admitting: Anesthesiology

## 2023-10-07 ENCOUNTER — Ambulatory Visit: Payer: Medicare Other | Admitting: Internal Medicine

## 2023-10-07 ENCOUNTER — Encounter (HOSPITAL_COMMUNITY)
Admission: RE | Disposition: A | Discharge status: Home / Self Care | Payer: Self-pay | Source: Home / Self Care | Attending: Urology

## 2023-10-07 ENCOUNTER — Other Ambulatory Visit: Payer: Self-pay

## 2023-10-07 ENCOUNTER — Ambulatory Visit (HOSPITAL_COMMUNITY): Payer: Medicare Other | Admitting: Physician Assistant

## 2023-10-07 ENCOUNTER — Ambulatory Visit (HOSPITAL_COMMUNITY): Payer: Medicare Other

## 2023-10-07 ENCOUNTER — Encounter (HOSPITAL_COMMUNITY): Payer: Self-pay | Admitting: Urology

## 2023-10-07 ENCOUNTER — Ambulatory Visit (HOSPITAL_COMMUNITY)
Admission: RE | Admit: 2023-10-07 | Discharge: 2023-10-07 | Disposition: A | Discharge status: Home / Self Care | Payer: Medicare Other | Attending: Urology | Admitting: Urology

## 2023-10-07 DIAGNOSIS — I5023 Acute on chronic systolic (congestive) heart failure: Secondary | ICD-10-CM

## 2023-10-07 DIAGNOSIS — N35919 Unspecified urethral stricture, male, unspecified site: Secondary | ICD-10-CM

## 2023-10-07 DIAGNOSIS — Z923 Personal history of irradiation: Secondary | ICD-10-CM | POA: Diagnosis not present

## 2023-10-07 DIAGNOSIS — I130 Hypertensive heart and chronic kidney disease with heart failure and stage 1 through stage 4 chronic kidney disease, or unspecified chronic kidney disease: Secondary | ICD-10-CM

## 2023-10-07 DIAGNOSIS — I4891 Unspecified atrial fibrillation: Secondary | ICD-10-CM | POA: Diagnosis not present

## 2023-10-07 DIAGNOSIS — Z8546 Personal history of malignant neoplasm of prostate: Secondary | ICD-10-CM | POA: Insufficient documentation

## 2023-10-07 DIAGNOSIS — E1122 Type 2 diabetes mellitus with diabetic chronic kidney disease: Secondary | ICD-10-CM

## 2023-10-07 DIAGNOSIS — I110 Hypertensive heart disease with heart failure: Secondary | ICD-10-CM | POA: Diagnosis not present

## 2023-10-07 DIAGNOSIS — G4730 Sleep apnea, unspecified: Secondary | ICD-10-CM | POA: Diagnosis not present

## 2023-10-07 DIAGNOSIS — I509 Heart failure, unspecified: Secondary | ICD-10-CM | POA: Insufficient documentation

## 2023-10-07 DIAGNOSIS — Z7901 Long term (current) use of anticoagulants: Secondary | ICD-10-CM | POA: Diagnosis not present

## 2023-10-07 DIAGNOSIS — N1832 Chronic kidney disease, stage 3b: Secondary | ICD-10-CM

## 2023-10-07 DIAGNOSIS — E119 Type 2 diabetes mellitus without complications: Secondary | ICD-10-CM | POA: Insufficient documentation

## 2023-10-07 DIAGNOSIS — N35912 Unspecified bulbous urethral stricture, male: Secondary | ICD-10-CM | POA: Diagnosis present

## 2023-10-07 HISTORY — PX: CYSTOSCOPY WITH URETHRAL DILATATION: SHX5125

## 2023-10-07 HISTORY — PX: CYSTOSCOPY WITH RETROGRADE URETHROGRAM: SHX6309

## 2023-10-07 LAB — GLUCOSE, CAPILLARY
Glucose-Capillary: 132 mg/dL — ABNORMAL HIGH (ref 70–99)
Glucose-Capillary: 124 mg/dL — ABNORMAL HIGH (ref 70–99)

## 2023-10-07 SURGERY — CYSTOSCOPY, WITH URETHRAL DILATION
Anesthesia: Monitor Anesthesia Care | Site: Urethra

## 2023-10-07 MED ORDER — FENTANYL CITRATE PF 50 MCG/ML IJ SOSY: 25.0000 ug | PREFILLED_SYRINGE | INTRAMUSCULAR | Status: DC | PRN

## 2023-10-07 MED ORDER — DEXAMETHASONE SODIUM PHOSPHATE 10 MG/ML IJ SOLN
INTRAMUSCULAR | Status: AC
  Filled 2023-10-07: qty 1

## 2023-10-07 MED ORDER — IOHEXOL 300 MG/ML  SOLN
INTRAMUSCULAR | Status: DC | PRN
  Administered 2023-10-07: 10 mL via URETHRAL

## 2023-10-07 MED ORDER — PROPOFOL 10 MG/ML IV BOLUS
INTRAVENOUS | Status: AC
  Filled 2023-10-07: qty 20

## 2023-10-07 MED ORDER — ORAL CARE MOUTH RINSE: 15.0000 mL | Freq: Once | OROMUCOSAL | Status: AC

## 2023-10-07 MED ORDER — OXYCODONE HCL 5 MG PO TABS
ORAL_TABLET | ORAL | Status: AC
Start: 2023-10-07 — End: 2023-10-07
  Administered 2023-10-07: 5 mg
  Filled 2023-10-07: qty 1

## 2023-10-07 MED ORDER — OXYCODONE HCL 5 MG PO TABS: 5.0000 mg | ORAL_TABLET | Freq: Once | ORAL | Status: DC | PRN

## 2023-10-07 MED ORDER — DEXAMETHASONE SODIUM PHOSPHATE 4 MG/ML IJ SOLN
INTRAMUSCULAR | Status: DC | PRN
  Administered 2023-10-07: 5 mg via INTRAVENOUS

## 2023-10-07 MED ORDER — PHENYLEPHRINE HCL (PRESSORS) 10 MG/ML IV SOLN
INTRAVENOUS | Status: DC | PRN
  Administered 2023-10-07 (×5): 80 ug via INTRAVENOUS

## 2023-10-07 MED ORDER — EPHEDRINE 5 MG/ML INJ
INTRAVENOUS | Status: AC
  Filled 2023-10-07: qty 5

## 2023-10-07 MED ORDER — ACETAMINOPHEN 10 MG/ML IV SOLN: 1000.0000 mg | Freq: Once | INTRAVENOUS | Status: DC | PRN

## 2023-10-07 MED ORDER — SODIUM CHLORIDE 0.9 % IR SOLN
Status: DC | PRN
  Administered 2023-10-07: 3000 mL

## 2023-10-07 MED ORDER — FENTANYL CITRATE PF 50 MCG/ML IJ SOSY
PREFILLED_SYRINGE | INTRAMUSCULAR | Status: AC
Start: 2023-10-07 — End: 2023-10-07
  Administered 2023-10-07: 50 ug
  Filled 2023-10-07: qty 2

## 2023-10-07 MED ORDER — ONDANSETRON HCL 4 MG/2ML IJ SOLN
INTRAMUSCULAR | Status: AC
  Filled 2023-10-07: qty 2

## 2023-10-07 MED ORDER — CEFAZOLIN SODIUM-DEXTROSE 2-4 GM/100ML-% IV SOLN
2.0000 g | INTRAVENOUS | Status: AC
  Administered 2023-10-07: 2 g via INTRAVENOUS
  Filled 2023-10-07: qty 100

## 2023-10-07 MED ORDER — ONDANSETRON HCL 4 MG/2ML IJ SOLN: 4.0000 mg | Freq: Once | INTRAMUSCULAR | Status: DC | PRN

## 2023-10-07 MED ORDER — OXYCODONE HCL 5 MG/5ML PO SOLN: 5.0000 mg | Freq: Once | ORAL | Status: DC | PRN

## 2023-10-07 MED ORDER — LACTATED RINGERS IV SOLN: INTRAVENOUS | Status: DC

## 2023-10-07 MED ORDER — LIDOCAINE 2% (20 MG/ML) 5 ML SYRINGE
INTRAMUSCULAR | Status: DC | PRN
  Administered 2023-10-07: 100 mg via INTRAVENOUS

## 2023-10-07 MED ORDER — CHLORHEXIDINE GLUCONATE 0.12 % MT SOLN
15.0000 mL | Freq: Once | OROMUCOSAL | Status: AC
  Administered 2023-10-07: 15 mL via OROMUCOSAL

## 2023-10-07 MED ORDER — LIDOCAINE HCL (PF) 2 % IJ SOLN
INTRAMUSCULAR | Status: AC
  Filled 2023-10-07: qty 5

## 2023-10-07 MED ORDER — PHENYLEPHRINE 80 MCG/ML (10ML) SYRINGE FOR IV PUSH (FOR BLOOD PRESSURE SUPPORT)
PREFILLED_SYRINGE | INTRAVENOUS | Status: AC
  Filled 2023-10-07: qty 10

## 2023-10-07 MED ORDER — PROPOFOL 10 MG/ML IV BOLUS
INTRAVENOUS | Status: DC | PRN
  Administered 2023-10-07: 100 mg via INTRAVENOUS

## 2023-10-07 MED ORDER — ONDANSETRON HCL 4 MG/2ML IJ SOLN
INTRAMUSCULAR | Status: DC | PRN
  Administered 2023-10-07: 4 mg via INTRAVENOUS

## 2023-10-07 SURGICAL SUPPLY — 25 items
BAG URINE DRAIN 2000ML AR STRL (UROLOGICAL SUPPLIES) ×2 IMPLANT
BAG URINE LEG 500ML (DRAIN) ×1 IMPLANT
BAG URO CATCHER STRL LF (MISCELLANEOUS) ×2 IMPLANT
BALLN NEPHROSTOMY (BALLOONS) ×2 IMPLANT
BALLN OPTILUME DCB 30X5X75 (BALLOONS) ×2 IMPLANT
BALLOON NEPHROSTOMY (BALLOONS) IMPLANT
BALLOON OPTILUME DCB 30X5X75 (BALLOONS) IMPLANT
CATH FOLEY 2W COUNCIL 20FR 5CC (CATHETERS) IMPLANT
CATH FOLEY 2W COUNCIL 5CC 16FR (CATHETERS) ×1 IMPLANT
CATH FOLEY 2W COUNCIL 5CC 18FR (CATHETERS) IMPLANT
CATH ROBINSON RED A/P 14FR (CATHETERS) IMPLANT
CATH URET 5FR 70CM CONE TIP (BALLOONS) IMPLANT
CATH URETL OPEN END 6FR 70 (CATHETERS) IMPLANT
CLOTH BEACON ORANGE TIMEOUT ST (SAFETY) ×2 IMPLANT
GLOVE BIO SURGEON STRL SZ 6.5 (GLOVE) ×2 IMPLANT
GOWN STRL REUS W/ TWL LRG LVL3 (GOWN DISPOSABLE) ×2 IMPLANT
GUIDEWIRE ANG ZIPWIRE 038X150 (WIRE) ×1 IMPLANT
GUIDEWIRE STR DUAL SENSOR (WIRE) ×1 IMPLANT
KIT TURNOVER KIT A (KITS) ×1 IMPLANT
MANIFOLD NEPTUNE II (INSTRUMENTS) ×2 IMPLANT
NS IRRIG 1000ML POUR BTL (IV SOLUTION) IMPLANT
PACK CYSTO (CUSTOM PROCEDURE TRAY) ×2 IMPLANT
SYR 30ML LL (SYRINGE) IMPLANT
TUBING CONNECTING 10 (TUBING) ×2 IMPLANT
WATER STERILE IRR 3000ML UROMA (IV SOLUTION) ×2 IMPLANT

## 2023-10-07 NOTE — Anesthesia Postprocedure Evaluation (Signed)
 Anesthesia Post Note  Patient: Antonio Clark  Procedure(s) Performed: CYSTOSCOPY WITH URETHRAL DILATATION (OPTILUM) (Urethra) RETROGRADE URETHROGRAM (Urethra)     Patient location during evaluation: PACU Anesthesia Type: General Level of consciousness: awake and alert Pain management: pain level controlled Vital Signs Assessment: post-procedure vital signs reviewed and stable Respiratory status: spontaneous breathing, nonlabored ventilation, respiratory function stable and patient connected to nasal cannula oxygen Cardiovascular status: blood pressure returned to baseline and stable Postop Assessment: no apparent nausea or vomiting Anesthetic complications: no   No notable events documented.  Last Vitals:  Vitals:   10/07/23 1045 10/07/23 1100  BP: (!) 137/59 133/69  Pulse: (!) 51 (!) 51  Resp: 16 17  Temp:    SpO2: 99% 99%    Last Pain:  Vitals:   10/07/23 1100  TempSrc:   PainSc: 0-No pain                 Mariann Barter

## 2023-10-07 NOTE — Anesthesia Procedure Notes (Signed)
 Procedure Name: LMA Insertion Date/Time: 10/07/2023 9:37 AM  Performed by: Alwyn Ren, CRNAPre-anesthesia Checklist: Patient identified, Emergency Drugs available, Suction available, Patient being monitored and Timeout performed Patient Re-evaluated:Patient Re-evaluated prior to induction Oxygen Delivery Method: Circle system utilized Preoxygenation: Pre-oxygenation with 100% oxygen Induction Type: IV induction LMA Size: 4.0 Number of attempts: 1

## 2023-10-07 NOTE — Anesthesia Preprocedure Evaluation (Signed)
 Anesthesia Evaluation  Patient identified by MRN, date of birth, ID band Patient awake    Reviewed: Allergy & Precautions, NPO status , Patient's Chart, lab work & pertinent test results, reviewed documented beta blocker date and time   History of Anesthesia Complications Negative for: history of anesthetic complications  Airway Mallampati: II  TM Distance: >3 FB     Dental  (+) Partial Upper   Pulmonary sleep apnea and Continuous Positive Airway Pressure Ventilation , neg COPD   breath sounds clear to auscultation       Cardiovascular hypertension, + angina with exertion + CAD and +CHF  + dysrhythmias Atrial Fibrillation  Rhythm:Regular Rate:Normal  LVEF 40-45%, mild RV dysfunction   Neuro/Psych neg Seizures    GI/Hepatic ,GERD  ,,  Endo/Other  diabetes, Type 2    Renal/GU CRFRenal disease     Musculoskeletal  (+) Arthritis ,    Abdominal   Peds  Hematology   Anesthesia Other Findings   Reproductive/Obstetrics                              Anesthesia Physical Anesthesia Plan  ASA: 3  Anesthesia Plan: MAC   Post-op Pain Management:    Induction: Intravenous  PONV Risk Score and Plan: 2 and Ondansetron and Propofol infusion  Airway Management Planned: Natural Airway and Simple Face Mask  Additional Equipment:   Intra-op Plan:   Post-operative Plan:   Informed Consent: I have reviewed the patients History and Physical, chart, labs and discussed the procedure including the risks, benefits and alternatives for the proposed anesthesia with the patient or authorized representative who has indicated his/her understanding and acceptance.     Dental advisory given  Plan Discussed with:   Anesthesia Plan Comments:          Anesthesia Quick Evaluation

## 2023-10-07 NOTE — Progress Notes (Signed)
 Sinus Bradycardia assessed by Dr Arrie Aran, Anesthesia.  OK to discharge per Dr. Ace Gins

## 2023-10-07 NOTE — Discharge Instructions (Addendum)
 Cystoscopy with Optilume Urethral balloon patient instructions  Following a cystoscopy, a catheter (a flexible rubber tube) is sometimes left in place to empty the bladder. This may cause some discomfort or a feeling that you need to urinate. Your doctor determines the period of time that the catheter will be left in place. You may have bloody urine for two to three days (Call your doctor if the amount of bleeding increases or does not subside).  You may pass blood clots in your urine, especially if you had a biopsy. It is not unusual to pass small blood clots and have some bloody urine a couple of weeks after your cystoscopy. Again, call your doctor if the bleeding does not subside. You may have: Dysuria (painful urination) Frequency (urinating often) Urgency (strong desire to urinate)  These symptoms are common especially if medicine is instilled into the bladder or a ureteral stent is placed. Avoiding alcohol and caffeine, such as coffee, tea, and chocolate, may help relieve these symptoms. Drink plenty of water, unless otherwise instructed. Your doctor may also prescribe an antibiotic or other medicine to reduce these symptoms.  Cystoscopy results are available soon after the procedure; biopsy results usually take two to four days. Your doctor will discuss the results of your exam with you. Before you go home, you will be given specific instructions for follow-up care. Special Instructions:   If you are going home with a catheter in place do not take a tub bath until removed by your doctor.   You may resume your normal activities.   Do not drive or operate machinery if you are taking narcotic pain medicine.   Be sure to keep all follow-up appointments with your doctor.   Call Your Doctor If: The catheter is not draining You have severe pain You are unable to urinate You have a fever over 101 You have severe bleeding

## 2023-10-07 NOTE — Interval H&P Note (Signed)
 History and Physical Interval Note:  10/07/2023 9:14 AM  Antonio Clark  has presented today for surgery, with the diagnosis of URETHRAL STRICTURE.  The various methods of treatment have been discussed with the patient and family. After consideration of risks, benefits and other options for treatment, the patient has consented to  Procedure(s) with comments: CYSTOSCOPY WITH URETHRAL DILATATION (OPTILUM) (N/A) - 60 MINUTES DIRECT VISION INTERNAL URETHROTOMY (N/A) RETROGRADE URETHROGRAM (N/A) as a surgical intervention.  The patient's history has been reviewed, patient examined, no change in status, stable for surgery.  I have reviewed the patient's chart and labs.  Questions were answered to the patient's satisfaction.     Aryanne Gilleland D Yulieth Carrender

## 2023-10-07 NOTE — Op Note (Signed)
 Operative Note  Preoperative diagnosis:  1.  Bulbar urethral stricture  Postoperative diagnosis: 1.  Bulbar uethral stricture  Procedure(s): 1.  Cystoscopy with urethral dilation  Surgeon: Kasandra Knudsen, MD  Assistants:  None  Anesthesia:  General  Complications:  None  EBL:  none  Specimens: 1. none  Drains/Catheters: 1.  16Fr council tip foley  Intraoperative findings:   Fluoroscopy showed previous brachytherapy seeds implanted in prostate as well as inflatable penile prosthetic Bulbar urethral stricture Pale prostatic tissue with lateral lobe hypertrophy Moderate trabeculation Bilateral orthotopic UOs  Indication:  Antonio Clark is a 88 y.o. male with history of prostate cancer s/p brachytherapy also with history of IPP with recurrent bulbar urethral stricture.  Description of procedure: After risks and benefits of the procedure discussed with the patient, informed consent was obtained.  The patient is taken the operating placed in supine position.  Anesthesia was induced and antibiotics were administered.  He was then repositioned in the dorsolithotomy position.  The patient was prepped and draped in the usual sterile fashion and timeout was performed.  A 21 French rigid cystoscope was placed in the urethra meatus and advanced to the bulbar urethra at which point the stricture was seen.  A 0.38 sensor wire was used to cannulate this strictured area and advanced to the bladder with fluoroscopic guidance.  The Ultrex urethral balloon dilator was then used to dilate this area.  After dilation occurred the balloon was removed and diagnostic cystoscopy took place.    Next the Optilume balloon dilator was placed over the wire so the radiopaque markers spanned the strictured area.  It was inflated and held in place for 3 minutes.  It was then deflated and a 16 [place] council tip Foley catheter was placed over the wire and into the bladder.  The wire was removed and the Foley  was inflated with 10 cc sterile water.  The patient emerged from anesthesia and sent to the PACU in stable condition.   Plan:   Discharge home with foley

## 2023-10-07 NOTE — Progress Notes (Signed)
 Attempted to call [facility] at [phone] and [phone] and requested call back to discuss discharge teaching. Additional set of discharge instructions to be sent home with patient.

## 2023-10-07 NOTE — Transfer of Care (Signed)
 Immediate Anesthesia Transfer of Care Note  Patient: Antonio Clark  Procedure(s) Performed: CYSTOSCOPY WITH URETHRAL DILATATION (OPTILUM) (Urethra) RETROGRADE URETHROGRAM (Urethra)  Patient Location: PACU  Anesthesia Type:General  Level of Consciousness: awake, alert , and oriented  Airway & Oxygen Therapy: Patient Spontanous Breathing and Patient connected to face mask oxygen  Post-op Assessment: Report given to RN and Post -op Vital signs reviewed and stable  Post vital signs: Reviewed and stable  Last Vitals:  Vitals Value Taken Time  BP 147/63 10/07/23 1013  Temp    Pulse 60 10/07/23 1015  Resp 22 10/07/23 1015  SpO2 100 % 10/07/23 1015  Vitals shown include unfiled device data.  Last Pain:  Vitals:   10/07/23 0801  TempSrc:   PainSc: 0-No pain         Complications: No notable events documented.

## 2023-10-08 ENCOUNTER — Encounter (HOSPITAL_COMMUNITY): Payer: Self-pay | Admitting: Urology

## 2023-10-21 ENCOUNTER — Ambulatory Visit: Payer: Medicare Other | Admitting: Internal Medicine

## 2023-10-21 ENCOUNTER — Encounter: Payer: Self-pay | Admitting: Internal Medicine

## 2023-10-21 VITALS — BP 124/70 | HR 97 | Temp 98.0°F | Resp 18 | Ht 69.0 in

## 2023-10-21 DIAGNOSIS — E119 Type 2 diabetes mellitus without complications: Secondary | ICD-10-CM

## 2023-10-21 DIAGNOSIS — I10 Essential (primary) hypertension: Secondary | ICD-10-CM

## 2023-10-21 DIAGNOSIS — N1832 Chronic kidney disease, stage 3b: Secondary | ICD-10-CM | POA: Diagnosis not present

## 2023-10-21 NOTE — Assessment & Plan Note (Signed)
 He remains on jardiance for his kidney function.  This is stable.  I asked him not to take any NSAIDS.

## 2023-10-21 NOTE — Assessment & Plan Note (Signed)
 His BP is currently controlled on his diabetic meds.  He needs to get a dilated eye exam and has an appointment coming up.

## 2023-10-21 NOTE — Assessment & Plan Note (Signed)
 His diabetes has been controlled.  He does not need to check his FSBS at this time.  We will continue his current meds.

## 2023-10-21 NOTE — Progress Notes (Signed)
 Office Visit  Subjective   Patient ID: Antonio Clark   DOB: 1936-06-27   Age: 88 y.o.   MRN: [id number]   Chief Complaint Chief Complaint  Patient presents with   Follow-up    3 month follow up     History of Present Illness The patient returns today for followup of his T2 diabetes.  He was diagnosed with T2 diabetes years ago.  Since the last visit, there have been no problems. He is currently on:  Jardiance 10mg  daily and Tradjenta 5mg  daily.   He is exercising by walking. He specifically denies unexplained abdominal pain, nausea or vomiting.  He does not routinely check blood sugars. His last HgBA1c was done on 10/04/2023 and was 6.2%.  He has no long term complications of diabetic retinopathy, nephropathy, or neuropathy but he does have a history of CAD.  He is going to see Dr. Charlotte Sanes in her office on 11/27/2023 for his yearly eye exam.   The patient is a 88 year old male who presents for a follow-up evaluation of hypertension. Since his last visit, he has not had any problems.  The patient has been checking her blood pressure at home. The patient's blood pressure has ranged systollically 120-130's. The patient's current medications include: Toprol XL 25mg  daily and spironolactone 25mg  daily.  The patient has been tolerating her medications well. The patient denies any headache, visual changes, dizziness, lightheadness, chest pain, shortness of breath, weakness/numbness, and edema. She reports there have been no other symptoms noted.    The patient also has Stage IIIb CKD which is probably due to his diabetes and hypertension.  It looks like his baseline creatinine is 1.8-2.2 and his GFR 28-35.  The patient denies any NSAID use.  His last kidney function was done on 10/04/2023 and his BUN was 29 and creatinine of 1.4.  He did have a procedure done on 10/07/2023 where he had a urethral stricture which they ballooned and he states they placed a stent.     Past Medical History Past Medical  History:  Diagnosis Date   Anal fissure    Arthritis    Atrial fibrillation (HCC)    Back pain    CHF (congestive heart failure) (HCC)    Colon polyps    Coronary artery disease    Diabetes mellitus type 2 with complications (HCC)    Dysrhythmia    a-fib   GERD (gastroesophageal reflux disease)    History of echocardiogram 07/ 07/ 2011   History of kidney stones    History of lithotripsy 1989   Hyperlipidemia    Hypertension    Kidney stones    Melanoma (HCC) 05/01/2019   right chest wall (12/20)   Prostate CA (HCC) 2010   Sleep apnea    no CPAP   Squamous cell carcinoma of skin 05/23/1992   bowens-left parietal scalp (CX35FU)   Squamous cell carcinoma of skin 03/14/2008   in situ-left upper outer forehead-medial ([id number])   Squamous cell carcinoma of skin 03/14/2008   in situ-crown of scalp ([id number])   Squamous cell carcinoma of skin 04/15/2011   in situ-right dorsal forearm (txpbx)   Squamous cell carcinoma of skin 10/16/2011   in situ-left sideburn   Squamous cell carcinoma of skin 03/11/2015   ka-left sideburn ([id number])   Squamous cell carcinoma of skin 03/11/2015   ka-left forearm ([id number])   Squamous cell carcinoma of skin 06/22/2018   in situ-left forearm, sup (txpbx)   Squamous cell carcinoma  of skin 05/01/2019   in situ-mid anterior scalp    Squamous cell carcinoma of skin 05/01/2019   in situ-right upper arm     Allergies No Known Allergies   Medications  Current Outpatient Medications:    acetaminophen (TYLENOL) 325 MG tablet, Take 2 tablets (650 mg total) by mouth every 6 (six) hours as needed for mild pain (or Fever >/= 101)., Disp: , Rfl:    amiodarone (PACERONE) 100 MG tablet, Take 100 mg by mouth daily., Disp: , Rfl:    apixaban (ELIQUIS) 2.5 MG TABS tablet, Take 1 tablet (2.5 mg total) by mouth 2 (two) times daily., Disp: 180 tablet, Rfl: 1   atorvastatin (LIPITOR) 80 MG tablet, Take 1 tablet (80 mg total) by mouth daily. (Patient taking  differently: Take 80 mg by mouth at bedtime.), Disp: 90 tablet, Rfl: 1   empagliflozin (JARDIANCE) 10 MG TABS tablet, Take 1 tablet (10 mg total) by mouth daily before breakfast., Disp: 90 tablet, Rfl: 3   finasteride (PROSCAR) 5 MG tablet, Take 5 mg by mouth daily., Disp: , Rfl:    linagliptin (TRADJENTA) 5 MG TABS tablet, Take 1 tablet (5 mg total) by mouth daily., Disp: 90 tablet, Rfl: 1   metoprolol succinate (TOPROL-XL) 25 MG 24 hr tablet, Take 1 tablet (25 mg total) by mouth daily. Take with or immediately following a meal., Disp: 90 tablet, Rfl: 3   nitroGLYCERIN (NITROSTAT) 0.4 MG SL tablet, Place 1 tablet (0.4 mg total) under the tongue every 5 (five) minutes x 3 doses as needed for chest pain., Disp: 25 tablet, Rfl: 12   oxymetazoline (AFRIN) 0.05 % nasal spray, Place 1 spray into both nostrils as needed for congestion., Disp: , Rfl:    spironolactone (ALDACTONE) 25 MG tablet, Take 1 tablet (25 mg total) by mouth at bedtime., Disp: 90 tablet, Rfl: 1   Review of Systems Review of Systems  Constitutional:  Negative for chills and fever.  Eyes:  Negative for blurred vision and double vision.  Respiratory:  Negative for hemoptysis and shortness of breath.   Cardiovascular:  Negative for chest pain, palpitations and leg swelling.  Gastrointestinal:  Negative for abdominal pain, blood in stool, constipation, diarrhea, melena, nausea and vomiting.  Genitourinary:  Negative for frequency and hematuria.  Musculoskeletal:  Negative for myalgias.  Skin:  Negative for itching and rash.  Neurological:  Negative for dizziness, weakness and headaches.  Endo/Heme/Allergies:  Negative for polydipsia.       Objective:    Vitals BP 124/70 (BP Location: Left Arm, Patient Position: Sitting, Cuff Size: Normal)   Pulse 97   Temp 98 F (36.7 C)   Resp 18   Ht 5\' 9"  (1.753 m)   SpO2 97%   BMI 25.81 kg/m    Physical Examination Physical Exam Constitutional:      Appearance: Normal appearance.  He is not ill-appearing.  Cardiovascular:     Rate and Rhythm: Normal rate and regular rhythm.     Pulses: Normal pulses.     Heart sounds: No murmur heard.    No friction rub. No gallop.  Pulmonary:     Effort: Pulmonary effort is normal. No respiratory distress.     Breath sounds: No wheezing, rhonchi or rales.  Abdominal:     General: Abdomen is flat. Bowel sounds are normal. There is no distension.     Palpations: Abdomen is soft.     Tenderness: There is no abdominal tenderness.  Musculoskeletal:     Right  lower leg: No edema.     Left lower leg: No edema.  Skin:    General: Skin is warm and dry.     Findings: No rash.  Neurological:     General: No focal deficit present.     Mental Status: He is alert and oriented to person, place, and time.  Psychiatric:        Mood and Affect: Mood normal.        Behavior: Behavior normal.        Assessment & Plan:   Essential hypertension His BP is currently controlled on his diabetic meds.  He needs to get a dilated eye exam and has an appointment coming up.  Diabetes mellitus without complication (HCC) His diabetes has been controlled.  He does not need to check his FSBS at this time.  We will continue his current meds.  Stage 3b chronic kidney disease (HCC) He remains on jardiance for his kidney function.  This is stable.  I asked him not to take any NSAIDS.    Return in about 3 months (around 01/21/2024).   Crist Fat, MD

## 2023-10-27 ENCOUNTER — Ambulatory Visit (HOSPITAL_COMMUNITY): Payer: Medicare Other | Attending: Cardiology

## 2023-10-27 DIAGNOSIS — E785 Hyperlipidemia, unspecified: Secondary | ICD-10-CM | POA: Insufficient documentation

## 2023-10-27 DIAGNOSIS — I4819 Other persistent atrial fibrillation: Secondary | ICD-10-CM | POA: Insufficient documentation

## 2023-10-27 DIAGNOSIS — I5022 Chronic systolic (congestive) heart failure: Secondary | ICD-10-CM | POA: Insufficient documentation

## 2023-10-27 DIAGNOSIS — I351 Nonrheumatic aortic (valve) insufficiency: Secondary | ICD-10-CM

## 2023-10-27 DIAGNOSIS — N183 Chronic kidney disease, stage 3 unspecified: Secondary | ICD-10-CM | POA: Insufficient documentation

## 2023-10-27 DIAGNOSIS — G4733 Obstructive sleep apnea (adult) (pediatric): Secondary | ICD-10-CM | POA: Insufficient documentation

## 2023-10-27 DIAGNOSIS — I2510 Atherosclerotic heart disease of native coronary artery without angina pectoris: Secondary | ICD-10-CM | POA: Insufficient documentation

## 2023-10-27 DIAGNOSIS — Z01810 Encounter for preprocedural cardiovascular examination: Secondary | ICD-10-CM | POA: Diagnosis not present

## 2023-10-27 LAB — ECHOCARDIOGRAM COMPLETE
Area-P 1/2: 2.34 cm2
P 1/2 time: 548 ms
S' Lateral: 2.8 cm

## 2023-10-29 ENCOUNTER — Telehealth: Payer: Self-pay

## 2023-10-29 NOTE — Telephone Encounter (Signed)
 Called patient advised of below they verbalized understanding.

## 2023-10-29 NOTE — Telephone Encounter (Signed)
-----   Message from [name] sent at 10/28/2023  7:13 AM EDT ----- Please let Antonio Clark know that his echocardiogram showed improvement in his heart squeeze and function, his EF is now normal at 55 to 60%. There is mild stiffening of the left lower chamber of the heart that is a common finding with aging and history of hypertension. There is mild leaking of the mitral valve and moderate leaking of the aortic valve as well as leaking of the pulmonic valve. The is some mild enlargement of the aorta that has been noted previously. Overall good results! Please remind him to let us know if he starts to have increased lower extremity edema, weight gain or increased shortness of breath. Otherwise follow up with Dr. Anne Fu as planned.

## 2023-11-11 ENCOUNTER — Ambulatory Visit: Payer: Medicare Other

## 2023-11-11 ENCOUNTER — Encounter: Payer: Self-pay | Admitting: Family Medicine

## 2023-11-11 VITALS — BP 118/70 | HR 68 | Temp 98.5°F | Ht 69.0 in | Wt 173.1 lb

## 2023-11-11 DIAGNOSIS — Z0001 Encounter for general adult medical examination with abnormal findings: Secondary | ICD-10-CM | POA: Diagnosis not present

## 2023-11-11 DIAGNOSIS — H9191 Unspecified hearing loss, right ear: Secondary | ICD-10-CM

## 2023-11-11 DIAGNOSIS — Z0000 Encounter for general adult medical examination without abnormal findings: Secondary | ICD-10-CM

## 2023-11-11 DIAGNOSIS — Z23 Encounter for immunization: Secondary | ICD-10-CM | POA: Insufficient documentation

## 2023-11-11 DIAGNOSIS — H9190 Unspecified hearing loss, unspecified ear: Secondary | ICD-10-CM | POA: Insufficient documentation

## 2023-11-11 NOTE — Progress Notes (Signed)
 Patient Office Visit  Assessment & Plan:  Encounter for Medicare annual wellness exam  Need for Tdap vaccination  Hearing loss of right ear, unspecified hearing loss type -     Hearing screening; Future -     Ambulatory referral to Audiology   Reviewed hearing screen done here- patient aware the right side has hearing loss. Audiology consult ordered.  Patient is agreeable to plan.  Patient will get a tetanus at the pharmacy Return in 1 year (on 11/10/2024), or if symptoms worsen or fail to improve.   Subjective:    Patient ID: Antonio Clark, male    DOB: Dec 19, 1935  Age: 88 y.o. MRN: [id number]  Chief Complaint  Patient presents with   Medicare Wellness    HPI See medicare wellness questions/answers.  Right ear decreased hearing- pt had ear wax last year and felt better once it was removed. Pt wonders if it's the same thing . No fever or chills or ear pain or drainage. Pt having hearing loss and this was evaluated at the same time as the medicare wellness. Pt has noticed decreased hearing right side for a long time and had an injury to right ear drum many years ago. Pt not sure if he is open to getting hearing aids if he needs them. Patient did not realize that hearing loss is associated with increased risk of dementia.  Health maintenance= pt does not want any more COVID vaccines. Pt believes the last vaccine may have caused his Afib.  The ASCVD Risk score (Arnett DK, et al., 2019) failed to calculate for the following reasons:   The 2019 ASCVD risk score is only valid for ages 60 to 38  Past Medical History:  Diagnosis Date   Anal fissure    Arthritis    Atrial fibrillation (HCC)    Back pain    CHF (congestive heart failure) (HCC)    Colon polyps    Coronary artery disease    Diabetes mellitus type 2 with complications (HCC)    Dysrhythmia    a-fib   GERD (gastroesophageal reflux disease)    History of echocardiogram 07/ 07/ 2011   History of kidney stones    History  of lithotripsy 1989   Hyperlipidemia    Hypertension    Kidney stones    Melanoma (HCC) 05/01/2019   right chest wall (12/20)   Prostate CA (HCC) 2010   Sleep apnea    no CPAP   Squamous cell carcinoma of skin 05/23/1992   bowens-left parietal scalp (CX35FU)   Squamous cell carcinoma of skin 03/14/2008   in situ-left upper outer forehead-medial (CX35FU)   Squamous cell carcinoma of skin 03/14/2008   in situ-crown of scalp (Cx35FU)   Squamous cell carcinoma of skin 04/15/2011   in situ-right dorsal forearm (txpbx)   Squamous cell carcinoma of skin 10/16/2011   in situ-left sideburn   Squamous cell carcinoma of skin 03/11/2015   ka-left sideburn (CX35FU)   Squamous cell carcinoma of skin 03/11/2015   ka-left forearm (CX35FU)   Squamous cell carcinoma of skin 06/22/2018   in situ-left forearm, sup (txpbx)   Squamous cell carcinoma of skin 05/01/2019   in situ-mid anterior scalp    Squamous cell carcinoma of skin 05/01/2019   in situ-right upper arm   Past Surgical History:  Procedure Laterality Date   ATRIAL FIBRILLATION ABLATION N/A 04/17/2020   Procedure: ATRIAL FIBRILLATION ABLATION;  Surgeon: Lanier Prude, MD;  [facility];  Service: Cardiovascular;  Laterality: N/A;   AXILLARY SENTINEL NODE BIOPSY Right 08/23/2019   Procedure: SENTINEL LYMPH NODE BIOSPY RIGHT AXILLA;  Surgeon: Almond Lint, MD;  Location: [facility];  Service: General;  Laterality: Right;   CARDIOVERSION N/A 05/10/2020   Procedure: CARDIOVERSION;  Surgeon: Meriam Sprague, MD;  Location: [facility] ENDOSCOPY;  Service: Cardiovascular;  Laterality: N/A;   CARDIOVERSION N/A 05/08/2021   Procedure: CARDIOVERSION;  Surgeon: Little Ishikawa, MD;  Location: [facility] ENDOSCOPY;  Service: Cardiovascular;  Laterality: N/A;   CARDIOVERSION N/A 02/17/2022   Procedure: CARDIOVERSION;  Surgeon: Jake Bathe, MD;  Location: [facility] ENDOSCOPY;  Service: Cardiovascular;  Laterality: N/A;    COLONOSCOPY  2002   CORONARY ANGIOGRAPHY N/A 02/13/2022   Procedure: CORONARY ANGIOGRAPHY;  Surgeon: Tonny Bollman, MD;  Location: [facility] INVASIVE CV LAB;  Service: Cardiovascular;  Laterality: N/A;   CORONARY ULTRASOUND/IVUS N/A 02/13/2022   Procedure: Intravascular Ultrasound/IVUS;  Surgeon: Tonny Bollman, MD;  Location: [facility] INVASIVE CV LAB;  Service: Cardiovascular;  Laterality: N/A;   CYSTOSCOPY WITH RETROGRADE URETHROGRAM N/A 10/07/2023   Procedure: RETROGRADE URETHROGRAM;  Surgeon: Noel Christmas, MD;  Location: [facility];  Service: Urology;  Laterality: N/A;   CYSTOSCOPY WITH URETHRAL DILATATION N/A 06/08/2023   Procedure: DIAGNOSTIC CYSTOSCOPY WITH OPTILUME URETHRAL DILATATION;  Surgeon: Noel Christmas, MD;  Location: [facility];  Service: Urology;  Laterality: N/A;  45 MINUTES   CYSTOSCOPY WITH URETHRAL DILATATION N/A 10/07/2023   Procedure: CYSTOSCOPY WITH URETHRAL DILATATION (OPTILUM);  Surgeon: Noel Christmas, MD;  Location[facility];  Service: Urology;  Laterality: N/A;  60 MINUTES   FASCIECTOMY Left 03/28/2019   Procedure: SEGMENTAL FASCIECTOMY LEFT RING FINGER;  Surgeon: Cindee Salt, MD;  Location: [facility];  Service: Orthopedics;  Laterality: Left;  ANESTHESIA  AXILLARY BLOCK   INSERTION PROSTATE RADIATION SEED  8 4 2010   per Dr Earlene Plater   MELANOMA EXCISION Right 06/22/2019   Procedure: WIDE LOCAL EXCISION RIGHT CHEST WALL MELANOMA, ADVANCEMENT FLAP CLOSURE FOR DEFECT 3X6 CM;  Surgeon: Almond Lint, MD;  Location: [facility];  Service: General;  Laterality: Right;   POLYPECTOMY  2002   RIGHT/LEFT HEART CATH AND CORONARY ANGIOGRAPHY N/A 02/11/2022   Procedure: RIGHT/LEFT HEART CATH AND CORONARY ANGIOGRAPHY;  Surgeon: Yvonne Kendall, MD;  Location: [facility] INVASIVE CV LAB;  Service: Cardiovascular;  Laterality: N/A;   TEE WITHOUT CARDIOVERSION N/A 02/17/2022   Procedure: TRANSESOPHAGEAL ECHOCARDIOGRAM (TEE);  Surgeon: Jake Bathe, MD;  Location: [facility] ENDOSCOPY;   Service: Cardiovascular;  Laterality: N/A;   THROAT SURGERY  1980   benign cyst   UPPER GASTROINTESTINAL ENDOSCOPY     URETHRAL STRICTURE DILATATION     also penile implant   Social History   Tobacco Use   Smoking status: Never   Smokeless tobacco: Never  Vaping Use   Vaping status: Never Used  Substance Use Topics   Alcohol use: Yes    Comment: rare   Drug use: No   Family History  Problem Relation Age of Onset   Coronary artery disease Brother        3 brothers had CABG   Arrhythmia Brother    Diabetes Brother    Hearing loss Brother    Hypertension Brother    Heart disease Brother    Heart failure Brother    Hypertension Brother    Heart disease Father    Hypertension Father    Sudden death Father    Stroke Mother  cerebral hemorrage   Colon cancer Mother 50       small intestine   Depression Mother    Stroke Sister    Suicidality Sister    Diabetes Brother    Hypertension Brother    Hyperlipidemia Sister    Esophageal cancer Neg Hx    Pancreatic cancer Neg Hx    Prostate cancer Neg Hx    Rectal cancer Neg Hx    Stomach cancer Neg Hx    No Known Allergies  ROS    Objective:    BP 118/70   Pulse 68   Temp 98.5 F (36.9 C)   Ht 5\' 9"  (1.753 m)   Wt 173 lb 2 oz (78.5 kg)   SpO2 99%   BMI 25.57 kg/m  BP Readings from Last 3 Encounters:  11/11/23 118/70  10/21/23 124/70  10/07/23 124/64   Wt Readings from Last 3 Encounters:  11/11/23 173 lb 2 oz (78.5 kg)  10/07/23 174 lb 12.8 oz (79.3 kg)  10/04/23 170 lb (77.1 kg)    Physical Exam Vitals and nursing note reviewed.  Constitutional:      Appearance: Normal appearance.  HENT:     Right Ear: External ear normal. There is no impacted cerumen.     Left Ear: Tympanic membrane and external ear normal. There is no impacted cerumen.     Ears:     Comments: Previous TM rupture (right) Cardiovascular:     Rate and Rhythm: Normal rate. Rhythm irregular.  Pulmonary:     Effort: Pulmonary  effort is normal.     Breath sounds: No wheezing or rhonchi.  Neurological:     General: No focal deficit present.     Mental Status: He is alert and oriented to person, place, and time. Mental status is at baseline.  Psychiatric:        Mood and Affect: Mood normal.        Behavior: Behavior normal.      No results found for any visits on 11/11/23.        Subjective:   DECKER COGDELL is a 88 y.o. male who presents for Medicare Annual/Subsequent preventive examination.  Visit Complete: In person  Patient Medicare AWV questionnaire was completed by the patient on 11/11/23; I have confirmed that all information answered by patient is correct and no changes since this date. Pt has moved to [facility] in [place] since last [facility].  Walking at his facility at [facility]. Pt has had no recent falls.  Family has one brother. Has nephews and nieces  Right sided hearing loss- pt wondered if he had wax. No ear pain. Pt does not wear hearing aids. Pt did mention at end of office visit that he had skiing accident many years ago and ruptured her eardrum.  Cardiac Risk Factors include: male gender;diabetes mellitus;advanced age (>90men, >17 women);Other (see comment);hypertension, Risk factor comments: Afib     Objective:    Today's Vitals   11/11/23 1113 11/11/23 1115  BP: 118/70   Pulse: 68   Temp: 98.5 F (36.9 C)   SpO2: 99%   Weight: 173 lb 2 oz (78.5 kg)   Height: 5\' 9"  (1.753 m)   PainSc:  4    Body mass index is 25.57 kg/m.     11/11/2023   11:29 AM 10/07/2023    7:54 AM 10/04/2023    9:23 AM 06/02/2023    7:47 PM 06/02/2023    2:09 PM 11/05/2022  12:55 PM 06/17/2022    3:47 PM  Advanced Directives  Does Patient Have a Medical Advance Directive? Yes Yes Yes No Yes Yes No  Type of Estate agent of [place];Living will Healthcare Power of [place];Living will Healthcare Power of [place];Living will  Healthcare  Power of [place];Living will Living will;Healthcare Power of Attorney   Does patient want to make changes to medical advance directive? No - Patient declined No - Patient declined    No - Patient declined   Copy of Healthcare Power of Attorney in Chart? No - copy requested No - copy requested No - copy requested  No - copy requested No - copy requested   Would patient like information on creating a medical advance directive?       No - Patient declined    Current Medications (verified) Outpatient Encounter Medications as of 11/11/2023  Medication Sig   acetaminophen (TYLENOL) 325 MG tablet Take 2 tablets (650 mg total) by mouth every 6 (six) hours as needed for mild pain (or Fever >/= 101).   apixaban (ELIQUIS) 2.5 MG TABS tablet Take 1 tablet (2.5 mg total) by mouth 2 (two) times daily.   atorvastatin (LIPITOR) 80 MG tablet Take 1 tablet (80 mg total) by mouth daily. (Patient taking differently: Take 80 mg by mouth at bedtime.)   empagliflozin (JARDIANCE) 10 MG TABS tablet Take 1 tablet (10 mg total) by mouth daily before breakfast.   finasteride (PROSCAR) 5 MG tablet Take 5 mg by mouth daily.   linagliptin (TRADJENTA) 5 MG TABS tablet Take 1 tablet (5 mg total) by mouth daily.   metoprolol succinate (TOPROL-XL) 25 MG 24 hr tablet Take 1 tablet (25 mg total) by mouth daily. Take with or immediately following a meal.   nitroGLYCERIN (NITROSTAT) 0.4 MG SL tablet Place 1 tablet (0.4 mg total) under the tongue every 5 (five) minutes x 3 doses as needed for chest pain.   oxymetazoline (AFRIN) 0.05 % nasal spray Place 1 spray into both nostrils as needed for congestion.   spironolactone (ALDACTONE) 25 MG tablet Take 1 tablet (25 mg total) by mouth at bedtime.   amiodarone (PACERONE) 100 MG tablet Take 100 mg by mouth daily.   No facility-administered encounter medications on file as of 11/11/2023.    Allergies (verified) Patient has no known allergies.   History: Past Medical History:  Diagnosis  Date   Anal fissure    Arthritis    Atrial fibrillation (HCC)    Back pain    CHF (congestive heart failure) (HCC)    Colon polyps    Coronary artery disease    Diabetes mellitus type 2 with complications (HCC)    Dysrhythmia    a-fib   GERD (gastroesophageal reflux disease)    History of echocardiogram 07/ 07/ 2011   History of kidney stones    History of lithotripsy 1989   Hyperlipidemia    Hypertension    Kidney stones    Melanoma (HCC) 05/01/2019   right chest wall (12/20)   Prostate CA (HCC) 2010   Sleep apnea    no CPAP   Squamous cell carcinoma of skin 05/23/1992   bowens-left parietal scalp (CX35FU)   Squamous cell carcinoma of skin 03/14/2008   in situ-left upper outer forehead-medial (CX35FU)   Squamous cell carcinoma of skin 03/14/2008   in situ-crown of scalp (Cx35FU)   Squamous cell carcinoma of skin 04/15/2011   in situ-right dorsal forearm (txpbx)   Squamous cell carcinoma of skin  10/16/2011   in situ-left sideburn   Squamous cell carcinoma of skin 03/11/2015   ka-left sideburn (CX35FU)   Squamous cell carcinoma of skin 03/11/2015   ka-left forearm (CX35FU)   Squamous cell carcinoma of skin 06/22/2018   in situ-left forearm, sup (txpbx)   Squamous cell carcinoma of skin 05/01/2019   in situ-mid anterior scalp    Squamous cell carcinoma of skin 05/01/2019   in situ-right upper arm   Past Surgical History:  Procedure Laterality Date   ATRIAL FIBRILLATION ABLATION N/A 04/17/2020   Procedure: ATRIAL FIBRILLATION ABLATION;  Surgeon: Lanier Prude, MD;  [facility];  Service: Cardiovascular;  Laterality: N/A;   AXILLARY SENTINEL NODE BIOPSY Right 08/23/2019   Procedure: SENTINEL LYMPH NODE BIOSPY RIGHT AXILLA;  Surgeon: Almond Lint, MD;  Location: [facility];  Service: General;  Laterality: Right;   CARDIOVERSION N/A 05/10/2020   Procedure: CARDIOVERSION;  Surgeon: Meriam Sprague, MD;  Location: [facility] ENDOSCOPY;   Service: Cardiovascular;  Laterality: N/A;   CARDIOVERSION N/A 05/08/2021   Procedure: CARDIOVERSION;  Surgeon: Little Ishikawa, MD;  Location: [facility] ENDOSCOPY;  Service: Cardiovascular;  Laterality: N/A;   CARDIOVERSION N/A 02/17/2022   Procedure: CARDIOVERSION;  Surgeon: Jake Bathe, MD;  Location: [facility] ENDOSCOPY;  Service: Cardiovascular;  Laterality: N/A;   COLONOSCOPY  2002   CORONARY ANGIOGRAPHY N/A 02/13/2022   Procedure: CORONARY ANGIOGRAPHY;  Surgeon: Tonny Bollman, MD;  Location: [facility] INVASIVE CV LAB;  Service: Cardiovascular;  Laterality: N/A;   CORONARY ULTRASOUND/IVUS N/A 02/13/2022   Procedure: Intravascular Ultrasound/IVUS;  Surgeon: Tonny Bollman, MD;  Location: [facility] INVASIVE CV LAB;  Service: Cardiovascular;  Laterality: N/A;   CYSTOSCOPY WITH RETROGRADE URETHROGRAM N/A 10/07/2023   Procedure: RETROGRADE URETHROGRAM;  Surgeon: Noel Christmas, MD;  [facility];  Service: Urology;  Laterality: N/A;   CYSTOSCOPY WITH URETHRAL DILATATION N/A 06/08/2023   Procedure: DIAGNOSTIC CYSTOSCOPY WITH OPTILUME URETHRAL DILATATION;  Surgeon: Noel Christmas, MD;  Location: [facility];  Service: Urology;  Laterality: N/A;  45 MINUTES   CYSTOSCOPY WITH URETHRAL DILATATION N/A 10/07/2023   Procedure: CYSTOSCOPY WITH URETHRAL DILATATION (OPTILUM);  Surgeon: Noel Christmas, MD;  Location: [facility];  Service: Urology;  Laterality: N/A;  60 MINUTES   FASCIECTOMY Left 03/28/2019   Procedure: SEGMENTAL FASCIECTOMY LEFT RING FINGER;  Surgeon: Cindee Salt, MD;  Location: [facility];  Service: Orthopedics;  Laterality: Left;  ANESTHESIA  AXILLARY BLOCK   INSERTION PROSTATE RADIATION SEED  8 4 2010   per Dr Earlene Plater   MELANOMA EXCISION Right 06/22/2019   Procedure: WIDE LOCAL EXCISION RIGHT CHEST WALL MELANOMA, ADVANCEMENT FLAP CLOSURE FOR DEFECT 3X6 CM;  Surgeon: Almond Lint, MD;  Location: [facility];  Service: General;  Laterality: Right;   POLYPECTOMY  2002    RIGHT/LEFT HEART CATH AND CORONARY ANGIOGRAPHY N/A 02/11/2022   Procedure: RIGHT/LEFT HEART CATH AND CORONARY ANGIOGRAPHY;  Surgeon: Yvonne Kendall, MD;  Location: [facility] INVASIVE CV LAB;  Service: Cardiovascular;  Laterality: N/A;   TEE WITHOUT CARDIOVERSION N/A 02/17/2022   Procedure: TRANSESOPHAGEAL ECHOCARDIOGRAM (TEE);  Surgeon: Jake Bathe, MD;  Location: [facility] ENDOSCOPY;  Service: Cardiovascular;  Laterality: N/A;   THROAT SURGERY  1980   benign cyst   UPPER GASTROINTESTINAL ENDOSCOPY     URETHRAL STRICTURE DILATATION     also penile implant   Family History  Problem Relation Age of Onset   Coronary artery disease Brother  3 brothers had CABG   Arrhythmia Brother    Diabetes Brother    Hearing loss Brother    Hypertension Brother    Heart disease Brother    Heart failure Brother    Hypertension Brother    Heart disease Father    Hypertension Father    Sudden death Father    Stroke Mother        cerebral hemorrage   Colon cancer Mother 69       small intestine   Depression Mother    Stroke Sister    Suicidality Sister    Diabetes Brother    Hypertension Brother    Hyperlipidemia Sister    Esophageal cancer Neg Hx    Pancreatic cancer Neg Hx    Prostate cancer Neg Hx    Rectal cancer Neg Hx    Stomach cancer Neg Hx    Social History   Socioeconomic History   Marital status: Single    Spouse name: Not on file   Number of children: 0   Years of education: Not on file   Highest education level: Not on file  Occupational History   Occupation: retired  Tobacco Use   Smoking status: Never   Smokeless tobacco: Never  Vaping Use   Vaping status: Never Used  Substance and Sexual Activity   Alcohol use: Yes    Comment: rare   Drug use: No   Sexual activity: Yes  Other Topics Concern   Not on file  Social History Narrative   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    Social Drivers of Research scientist (physical sciences) Strain: Low Risk  (11/11/2023)   Overall Financial Resource Strain (CARDIA)    Difficulty of Paying Living Expenses: Not hard at all  Food Insecurity: No Food Insecurity (11/11/2023)   Hunger Vital Sign    Worried About Running Out of Food in the Last Year: Never true    Ran Out of Food in the Last Year: Never true  Transportation Needs: No Transportation Needs (11/11/2023)   PRAPARE - Administrator, Civil Service (Medical): No    Lack of Transportation (Non-Medical): No  Physical Activity: Sufficiently Active (11/11/2023)   Exercise Vital Sign    Days of Exercise per Week: 7 days    Minutes of Exercise per Session: 30 min  Stress: No Stress Concern Present (11/11/2023)   [employer] of Occupational Health - Occupational Stress Questionnaire    Feeling of Stress : Not at all  Social Connections: Moderately Integrated (11/11/2023)   Social Connection and Isolation Panel [NHANES]    Frequency of Communication with Friends and Family: More than three times a week    Frequency of Social Gatherings with Friends and Family: More than three times a week    Attends Religious Services: More than 4 times per year    Active Member of [employer] or Organizations: Yes    Attends Engineer, structural: More than 4 times per year    Marital Status: Never married    Tobacco Counseling Counseling given: Not Answered   Clinical Intake:  Pre-visit preparation completed: Yes  Pain : 0-10 Pain Score: 4  Pain Type: Acute pain Pain Location: Finger (Comment which one) (Right thumb) Pain Orientation: Right Pain Descriptors / Indicators: Aching Pain Onset: More than a month ago Pain Frequency: Several days a week Pain Relieving Factors: Pt does not like taking tylenol so he  has not taken anything. Effect of Pain on Daily Activities: Pt states that he works with his hands and when he picks things up, it is painful.  Pain Relieving Factors: Pt does not like taking tylenol so he  has not taken anything.  BMI - recorded: 25.57 Nutritional Status: BMI 25 -29 Overweight Nutritional Risks: None Diabetes: Yes CBG done?: No Did pt. bring in CBG monitor from home?: No  How often do you need to have someone help you when you read instructions, pamphlets, or other written materials from your doctor or pharmacy?: 1 - Never What is the last grade level you completed in school?: Technical school  Interpreter Needed?: No  Information entered by :: Renee Pain, CMA   Activities of Daily Living    11/11/2023   11:21 AM 10/04/2023    9:25 AM  In your present state of health, do you have any difficulty performing the following activities:  Hearing? 0   Vision? 0   Comment Wears readers   Difficulty concentrating or making decisions? 0   Walking or climbing stairs? 0   Dressing or bathing? 0   Doing errands, shopping? 0 0  Preparing Food and eating ? N   Using the Toilet? N   In the past six months, have you accidently leaked urine? N   Do you have problems with loss of bowel control? N   Managing your Medications? Y   Comment Pt lives at retirement community- his medications are prepared for him   Managing your Finances? N   Housekeeping or managing your Housekeeping? N     Patient Care Team: Donita Brooks, MD as PCP - General (Family Medicine) Jake Bathe, MD as PCP - Cardiology (Cardiology) Erroll Luna, [facility] (Inactive) as Pharmacist (Pharmacist) Maris Berger, MD as Consulting Physician (Ophthalmology) Laurey Morale, MD as Consulting Physician (Cardiology) Gillian Shields Alta Corning, MD as Referring Physician (Urology)  Indicate any recent Medical Services you may have received from other than [facility] providers in the past year (date may be approximate).     Assessment:   This is a routine wellness examination for Brailen.  Hearing/Vision screen Hearing Screening   500Hz  1000Hz  2000Hz  3000Hz  4000Hz   Right ear 40 NR NR 25 NR  Left ear  40 40 40 NR NR  Comments: Concerned about wax in R ear.  Vision Screening - Comments:: Sees eye dr regularly- Appt is April 19th.   Goals Addressed             This Visit's Progress    Activity and Exercise Increased       Evidence-based guidance:  Review current exercise levels.  Assess patient perspective on exercise or activity level, barriers to increasing activity, motivation and readiness for change.  Recommend or set healthy exercise goal based on individual tolerance.  Encourage small steps toward making change in amount of exercise or activity.  Urge reduction of sedentary activities or screen time.  Promote group activities within the community or with family or support person.  Consider referral to rehabiliation therapist for assessment and exercise/activity plan.   Notes:       Depression Screen    11/11/2023   11:32 AM 01/08/2023   10:01 AM 11/24/2022   12:01 PM 11/05/2022   12:54 PM 09/29/2022   11:39 AM 07/21/2022   11:11 AM 06/25/2022    2:55 PM  PHQ 2/9 Scores  PHQ - 2 Score 0 0 0 0 2 1 2   PHQ-  9 Score     6 2 10     Fall Risk    11/11/2023   11:31 AM 01/08/2023   10:01 AM 11/24/2022   12:01 PM 11/05/2022   12:52 PM 06/17/2022    3:45 PM  Fall Risk   Falls in the past year? 0 0 0 0   Number falls in past yr: 0 0 0 0 1  Injury with Fall? 0 0 0 0 0  Risk for fall due to :  No Fall Risks No Fall Risks No Fall Risks Impaired balance/gait  Follow up  Falls prevention discussed Falls prevention discussed Falls prevention discussed;Education provided;Falls evaluation completed Education provided;Falls prevention discussed    MEDICARE RISK AT HOME: Medicare Risk at Home Any stairs in or around the home?: Yes If so, are there any without handrails?: No Home free of loose throw rugs in walkways, pet beds, electrical cords, etc?: Yes Adequate lighting in your home to reduce risk of falls?: Yes Life alert?: Yes Use of a cane, walker or w/c?: Yes (Has a cane and  walker but does not use) Grab bars in the bathroom?: Yes Shower chair or bench in shower?: Yes Elevated toilet seat or a handicapped toilet?: Yes  TIMED UP AND GO:  Was the test performed?  No    Cognitive Function: normal N        11/11/2023   11:32 AM 11/05/2022   12:56 PM 06/07/2020    9:33 AM  6CIT Screen  What Year? 0 points 0 points 0 points  What month? 0 points 0 points 0 points  What time? 0 points 0 points 0 points  Count back from 20 0 points 0 points 0 points  Months in reverse 4 points 0 points 0 points  Repeat phrase 4 points 0 points 0 points  Total Score 8 points 0 points 0 points    Immunizations Immunization History  Administered Date(s) Administered   Fluad Quad(high Dose 65+) 04/27/2019, 05/21/2020   Fluad Trivalent(High Dose 65+) 05/17/2023   Influenza, High Dose Seasonal PF 06/07/2017, 05/07/2018   Influenza,inj,Quad PF,6+ Mos 05/12/2016   Influenza-Unspecified 05/30/2015   PFIZER(Purple Top)SARS-COV-2 Vaccination 09/01/2019, 09/21/2019, 05/28/2020   Pneumococcal Conjugate-13 07/08/2015   Pneumococcal Polysaccharide-23 01/20/2016, 05/17/2023   Tdap 06/11/2011   Zoster Recombinant(Shingrix) 07/09/2017, 09/30/2017    TDAP status: Due, Education has been provided regarding the importance of this vaccine. Advised may receive this vaccine at local pharmacy or Health Dept. Aware to provide a copy of the vaccination record if obtained from local pharmacy or Health Dept. Verbalized acceptance and understanding.  Flu Vaccine status: Up to date  Pneumococcal vaccine status: Up to date  Covid-19 vaccine status: Declined, Education has been provided regarding the importance of this vaccine but patient still declined. Advised may receive this vaccine at local pharmacy or Health Dept.or vaccine clinic. Aware to provide a copy of the vaccination record if obtained from local pharmacy or Health Dept. Verbalized acceptance and understanding.  Qualifies for Shingles  Vaccine? Yes   Zostavax completed Yes   Shingrix Completed?: Yes  Screening Tests Health Maintenance  Topic Date Due   COVID-19 Vaccine (4 - 2024-25 season) 04/11/2023   OPHTHALMOLOGY EXAM  11/11/2023   Diabetic kidney evaluation - Urine ACR  11/24/2023   FOOT EXAM  11/24/2023   INFLUENZA VACCINE  03/10/2024   HEMOGLOBIN A1C  04/02/2024   Diabetic kidney evaluation - eGFR measurement  10/03/2024   Medicare Annual Wellness (AWV)  11/10/2024   Pneumonia  Vaccine 30+ Years old  Completed   Zoster Vaccines- Shingrix  Completed   HPV VACCINES  Aged Out   DTaP/Tdap/Td  Discontinued    Health Maintenance  Health Maintenance Due  Topic Date Due   COVID-19 Vaccine (4 - 2024-25 season) 04/11/2023   OPHTHALMOLOGY EXAM  11/11/2023   Diabetic kidney evaluation - Urine ACR  11/24/2023    Colorectal cancer screening: Type of screening: Colonoscopy. Completed 10/11/2020. Repeat every 10 years  Lung Cancer Screening: (Low Dose CT Chest recommended if Age 28-80 years, 20 pack-year currently smoking OR have quit w/in 15years.) does not qualify.   Lung Cancer Screening Referral: N/A   Additional Screening:  Hepatitis C Screening: does not qualify; Completed No  Vision Screening: Recommended annual ophthalmology exams for early detection of glaucoma and other disorders of the eye. Is the patient up to date with their annual eye exam?  No  Has appt 11/27/2023 Who is the provider or what is the name of the office in which the patient attends annual eye exams? [facility] If pt is not established with a provider, would they like to be referred to a provider to establish care? No .   Dental Screening: Recommended annual dental exams for proper oral hygiene  Diabetic Foot Exam: Diabetic Foot Exam: Completed 11/24/2022  Community Resource Referral / Chronic Care Management: CRR required this visit?  No   CCM required this visit?  No     Plan:    Audiology consult ordered.  Tdap  is not covered by his insurance so he will get it at the pharmacy. I have personally reviewed and noted the following in the patient's chart:   Medical and social history Use of alcohol, tobacco or illicit drugs  Current medications and supplements including opioid prescriptions. Patient is not currently taking opioid prescriptions. Functional ability and status Nutritional status Physical activity Advanced directives List of other physicians Hospitalizations, surgeries, and ER visits in previous 12 months Vitals Screenings to include cognitive, depression, and falls- pt states he feels depressed at times but able to get out of it, generally very upbeat person  Referrals and appointments  In addition, I have reviewed and discussed with patient certain preventive protocols, quality metrics, and best practice recommendations. A written personalized care plan for preventive services as well as general preventive health recommendations were provided to patient.     Bernadette Hoit, MD   11/11/2023   After Visit Summary: (In Person-Printed) AVS printed and given to the patient  Nurse Notes: It was a pleasure to assist you with your AWV. I enjoyed talking with you!

## 2023-11-17 LAB — HM DIABETES EYE EXAM

## 2023-11-30 ENCOUNTER — Other Ambulatory Visit (HOSPITAL_COMMUNITY): Payer: Self-pay | Admitting: Cardiology

## 2023-11-30 MED ORDER — METOPROLOL SUCCINATE ER 25 MG PO TB24: 25.0000 mg | ORAL_TABLET | Freq: Every day | ORAL | 3 refills | Status: DC

## 2023-11-30 MED ORDER — AMIODARONE HCL 100 MG PO TABS: 100.0000 mg | ORAL_TABLET | Freq: Every day | ORAL | 6 refills | Status: DC

## 2023-12-06 ENCOUNTER — Other Ambulatory Visit (HOSPITAL_COMMUNITY): Payer: Self-pay | Admitting: Cardiology

## 2023-12-07 ENCOUNTER — Telehealth (HOSPITAL_COMMUNITY): Payer: Self-pay | Admitting: Cardiology

## 2023-12-07 NOTE — Telephone Encounter (Signed)
 Front office received message from the CMA Jasmine Torrence that this patient is overdue for a f/u appt with Dr. Mitzie Anda in the [facility]. Left voice message for patient to call front office back to scheule an follow up visit in the clinic.

## 2023-12-07 NOTE — Telephone Encounter (Signed)
 Patient called front office to let the [facility] know that he is now being followed by Dr. Renna Cary at [facility]. Patient wanted to let Dr. Montez Ape clinical staff know that because at his last visit he stated they transitioned him to Dr. Renna Cary.   Patient would like to hold off on f/u appt with Dr. Mitzie Anda at this time.

## 2023-12-08 ENCOUNTER — Other Ambulatory Visit (HOSPITAL_COMMUNITY): Payer: Self-pay

## 2023-12-08 DIAGNOSIS — I5022 Chronic systolic (congestive) heart failure: Secondary | ICD-10-CM

## 2023-12-08 MED ORDER — AMIODARONE HCL 100 MG PO TABS: 100.0000 mg | ORAL_TABLET | Freq: Every day | ORAL | 1 refills | Status: DC

## 2023-12-23 ENCOUNTER — Ambulatory Visit (HOSPITAL_BASED_OUTPATIENT_CLINIC_OR_DEPARTMENT_OTHER): Payer: Medicare Other | Admitting: Cardiology

## 2023-12-23 ENCOUNTER — Encounter (HOSPITAL_BASED_OUTPATIENT_CLINIC_OR_DEPARTMENT_OTHER): Payer: Self-pay | Admitting: Cardiology

## 2023-12-23 VITALS — BP 122/76 | HR 88 | Ht 69.0 in | Wt 168.6 lb

## 2023-12-23 DIAGNOSIS — I2510 Atherosclerotic heart disease of native coronary artery without angina pectoris: Secondary | ICD-10-CM | POA: Diagnosis not present

## 2023-12-23 DIAGNOSIS — I4819 Other persistent atrial fibrillation: Secondary | ICD-10-CM

## 2023-12-23 DIAGNOSIS — N183 Chronic kidney disease, stage 3 unspecified: Secondary | ICD-10-CM

## 2023-12-23 DIAGNOSIS — I5022 Chronic systolic (congestive) heart failure: Secondary | ICD-10-CM | POA: Diagnosis not present

## 2023-12-23 NOTE — Progress Notes (Signed)
 Cardiology Office Note:  .   Date:  12/23/2023  ID:  Antonio Clark, DOB 22-Mar-1936, MRN [id number] PCP: Austine Lefort, MD   HeartCare Providers Cardiologist:  Dorothye Gathers, MD     History of Present Illness: .   Antonio Clark is a 88 y.o. male Discussed the use of AI scribe software for clinical note transcription with the patient, who gave verbal consent to proceed.  History of Present Illness Antonio Clark "Charon Copper" is an 88 year old male with coronary artery disease, atrial fibrillation, hypertension, hyperlipidemia, and chronic systolic heart failure who presents for follow-up. He was formerly seen by Doctor Mitzie Anda for management of his cardiac conditions.  He has a history of coronary artery disease with a cardiac catheterization on February 13, 2022, showing calcified left main stenosis with a luminal area of 12 mm. Percutaneous coronary intervention was deferred at that time. He is currently managed with atorvastatin  80 mg at bedtime and other cardiac medications. No chest pain is reported.  He has chronic atrial fibrillation and underwent ablation in 2021. He is currently on amiodarone  100 mg daily and Eliquis  2.5 mg twice daily, which is dose-adjusted. No palpitations are reported, and he maintains a regular heart rate. He has experienced episodes of leg weakness but no recent falls.  His chronic systolic heart failure has shown improvement with an ejection fraction that was as low as 25-30% but improved to 40-45% in 2023. He is on a regimen including Jardiance  10 mg daily, Toprol  XL 25 mg daily, and spironolactone  25 mg daily. He experiences fatigue and decreased energy levels, impacting his ability to perform activities such as mowing his lawn, leading to his move to independent living at [facility].  He has a history of urethral dilatation in February 2025 due to urinary issues and reports ongoing problems with urinary flow, requiring further evaluation.  He has obstructive  sleep apnea but is unable to tolerate CPAP therapy.  His creatinine has ranged from 1.5 to 2.2, with a recent value of 1.49, and his LDL was previously noted to be 54. No dizziness, blackouts, or chest pain are reported. He reports episodes where his legs have given way, but he has not experienced falls recently.       Studies Reviewed: .        Results LABS Creatinine: 1.49 (10/04/2023) Hemoglobin: 13 (10/04/2023)  DIAGNOSTIC Cardiac catheterization: Calcified left main stenosis with luminal area of 12 mm (02/13/2022) Risk Assessment/Calculations:            Physical Exam:   VS:  BP 122/76   Pulse 88   Ht 5\' 9"  (1.753 m)   Wt 168 lb 9.6 oz (76.5 kg)   SpO2 99%   BMI 24.90 kg/m    Wt Readings from Last 3 Encounters:  12/23/23 168 lb 9.6 oz (76.5 kg)  11/11/23 173 lb 2 oz (78.5 kg)  10/07/23 174 lb 12.8 oz (79.3 kg)    GEN: Well nourished, well developed in no acute distress NECK: No JVD; No carotid bruits CARDIAC: Rare ectopy RRR, no murmurs, no rubs, no gallops RESPIRATORY:  Clear to auscultation without rales, wheezing or rhonchi  ABDOMEN: Soft, non-tender, non-distended EXTREMITIES:  No edema; No deformity   ASSESSMENT AND PLAN: .    Assessment and Plan Assessment & Plan Atrial Fibrillation Chronic atrial fibrillation with a history of ablation in 2021. Currently on low dose amiodarone  100 mg daily to maintain sinus rhythm, as EF improved in sinus rhythm.  On Eliquis  2.5 mg twice daily for stroke prevention. No recent episodes of palpitations or dizziness. Occasional leg weakness without dizziness or blackout. - Continue amiodarone  100 mg daily. - Continue Eliquis  2.5 mg twice daily.  Coronary Artery Disease Coronary artery disease with calcified left main stenosis noted on cardiac catheterization in July 2023. PCI was deferred. No chest pain reported.  Chronic Systolic Heart Failure Chronic systolic heart failure with previous EF as low as 25-30%, improved  to 40-45% in 2023. Now 55-60% Well-managed on spironolactone , metoprolol , and Jardiance . Reports reduced exercise tolerance but remains active by walking stairs. - Continue spironolactone  25 mg daily. - Continue metoprolol  25 mg daily. - Continue Jardiance  10 mg daily.  Hypertension Hypertension managed with current medication regimen. Blood pressure appears well-controlled.  Hyperlipidemia Hyperlipidemia managed with atorvastatin  80 mg at bedtime. Previous LDL was 54, indicating good control. - Continue atorvastatin  80 mg at bedtime.  Obstructive Sleep Apnea Obstructive sleep apnea. Unable to tolerate CPAP therapy.  Urinary Obstruction Urinary obstruction with recent urethral dilatation and stent placement. Ongoing issues with urinary flow and nocturia. Plans to follow up with urologist for further evaluation. - Follow up with urologist for further evaluation of urinary obstruction.         6 mths APP, 12 months me  Signed, Dorothye Gathers, MD

## 2023-12-23 NOTE — Patient Instructions (Signed)
 Medication Instructions:  Your physician recommends that you continue on your current medications as directed. Please refer to the Current Medication list given to you today.  *If you need a refill on your cardiac medications before your next appointment, please call your pharmacy*  Lab Work: NONE  Testing/Procedures: NONE  Follow-Up: At [facility], you and your health needs are our priority.  As part of our continuing mission to provide you with exceptional heart care, our providers are all part of one team.  This team includes your primary Cardiologist (physician) and Advanced Practice Providers or APPs (Physician Assistants and Nurse Practitioners) who all work together to provide you with the care you need, when you need it.  Your next appointment:   6 month(s)  Provider:   One of our Advanced Practice Providers (APPs): Melita Springer, PA-C  Friddie Jetty, NP Evaline Hill, NP  Theotis Flake, PA-C Lawana Pray, NP  Willis Harter, PA-C Lovette Rud, PA-C  Alva, PA-C Ernest Dick, NP  Marlana Silvan, NP Marcie Sever, PA-C  Laquita Plant, PA-C    Dayna Dunn, PA-C  Scott Weaver, PA-C Palmer Bobo, NP Katlyn West, NP Callie Goodrich, PA-C  Evan Williams, PA-C Sheng Haley, PA-C  Xika Zhao, NP Kathleen Johnson, PA-C   Then, Dorothye Gathers, MD will plan to see you again in 12 month(s).    We recommend signing up for the patient portal called "MyChart".  Sign up information is provided on this After Visit Summary.  MyChart is used to connect with patients for Virtual Visits (Telemedicine).  Patients are able to view lab/test results, encounter notes, upcoming appointments, etc.  Non-urgent messages can be sent to your provider as well.   To learn more about what you can do with MyChart, go to [email or web address].

## 2024-01-13 ENCOUNTER — Other Ambulatory Visit: Payer: Self-pay | Admitting: Internal Medicine

## 2024-01-13 ENCOUNTER — Ambulatory Visit: Admitting: Internal Medicine

## 2024-01-13 ENCOUNTER — Encounter: Payer: Self-pay | Admitting: Internal Medicine

## 2024-01-13 VITALS — BP 120/60 | HR 78 | Temp 97.3°F | Resp 18 | Ht 69.0 in | Wt 174.1 lb

## 2024-01-13 DIAGNOSIS — I480 Paroxysmal atrial fibrillation: Secondary | ICD-10-CM

## 2024-01-13 DIAGNOSIS — I10 Essential (primary) hypertension: Secondary | ICD-10-CM | POA: Diagnosis not present

## 2024-01-13 DIAGNOSIS — E119 Type 2 diabetes mellitus without complications: Secondary | ICD-10-CM | POA: Diagnosis not present

## 2024-01-13 DIAGNOSIS — N1832 Chronic kidney disease, stage 3b: Secondary | ICD-10-CM | POA: Diagnosis not present

## 2024-01-13 NOTE — Progress Notes (Signed)
 Office Visit  Subjective   Patient ID: Antonio Clark   DOB: 1936/07/08   Age: 88 y.o.   MRN: [id number]   Chief Complaint Chief Complaint  Patient presents with   Follow-up    3 month follow up     History of Present Illness The patient returns today for followup of his T2 diabetes.  He was diagnosed with T2 diabetes years ago.  Since the last visit, there have been no problems. He is currently on:  Jardiance  10mg  daily and Tradjenta  5mg  daily.   He is exercising by walking. He specifically denies unexplained abdominal pain, nausea or vomiting.  He does not routinely check blood sugars. His last HgBA1c was done on 10/04/2023 and was 6.2%.  He has no long term complications of diabetic retinopathy, nephropathy, or neuropathy but he does have a history of CAD.  He is going to see Dr. McCuen in her office on 11/27/2023 for his yearly eye exam.  The patient also has Stage IIIb CKD which is probably due to his diabetes and hypertension.  It looks like his baseline creatinine is 1.8-2.2 and his GFR 28-35.  The patient denies any NSAID use.  His last kidney function was done on 10/04/2023 and his BUN was 29 and creatinine of 1.4.   The patient is a 88 year old male who presents for a follow-up evaluation of hypertension. Since his last visit, he has not had any problems.  The patient has been checking her blood pressure at home. The patient's blood pressure has ranged systollically 120-130's. The patient's current medications include: Toprol  XL 25mg  daily and spironolactone  25mg  daily.  The patient has been tolerating her medications well. The patient denies any headache, visual changes, dizziness, lightheadness, chest pain, shortness of breath, weakness/numbness, and edema. She reports there have been no other symptoms noted.   This patient has paroxysmal atrial fibrillation which he states was diagnosed in 2021.  He states that he went into A. Fib due to his COVID-19 immunization.  He presents today for  a status visit. He is s/p ablation in 04/2020 and DCCV in 02/2022.  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  His last ECHO was done in 07/22/2022 that showed his LVEF was 40-45% with mildly decrease LV function.  The LV demonstrated global hypokinesis and there was moderate concentric LVH and he had Grade I diastolic dysfunction and his RV systolic function was mildly reduced.  There was normal pulmonary artery systolic pressure.  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  When compared to prior TTE 02/17/2022, his EF has improved. His atrial fibrillation is controlled with therapy as summarized in the medication list and previous notes.  He is currently on amiodarone  100mg  daily and toprol  XL 25mg  daily.   Specifically denied complaints: chest pain, generalized weakness, SOB, palpitations, syncope or TIAs. The patient is currently on anticoagulation with eliquis  2.5mg  BID.  He last saw his cardiologist on 12/23/2023 where he felt his A. Fib was controlled.  He wants to continue him on amiodarone  and eliquis .  He has some skin bruising but no hemoptysis, BRBPR, or melena.       Past Medical History Past Medical History:  Diagnosis Date   Anal fissure    Arthritis    Atrial fibrillation (HCC)    Back pain    CHF (congestive heart failure) (HCC)    Colon polyps    Coronary artery disease    Diabetes mellitus type 2 with complications (  HCC)    Dysrhythmia    a-fib   GERD (gastroesophageal reflux disease)    History of echocardiogram 07/ 07/ 2011   History of kidney stones    History of lithotripsy 1989   Hyperlipidemia    Hypertension    Kidney stones    Melanoma (HCC) 05/01/2019   right chest wall (12/20)   Prostate CA (HCC) 2010   Sleep apnea    no CPAP   Squamous cell carcinoma of skin 05/23/1992   bowens-left parietal scalp (CX35FU)   Squamous cell carcinoma of skin 03/14/2008   in situ-left upper outer forehead-medial ([id number])   Squamous cell carcinoma of  skin 03/14/2008   in situ-crown of scalp ([id number])   Squamous cell carcinoma of skin 04/15/2011   in situ-right dorsal forearm (txpbx)   Squamous cell carcinoma of skin 10/16/2011   in situ-left sideburn   Squamous cell carcinoma of skin 03/11/2015   ka-left sideburn ([id number])   Squamous cell carcinoma of skin 03/11/2015   ka-left forearm ([id number])   Squamous cell carcinoma of skin 06/22/2018   in situ-left forearm, sup (txpbx)   Squamous cell carcinoma of skin 05/01/2019   in situ-mid anterior scalp    Squamous cell carcinoma of skin 05/01/2019   in situ-right upper arm     Allergies No Known Allergies   Medications  Current Outpatient Medications:    acetaminophen  (TYLENOL ) 325 MG tablet, Take 2 tablets (650 mg total) by mouth every 6 (six) hours as needed for mild pain (or Fever >/= 101)., Disp: , Rfl:    amiodarone  (PACERONE ) 100 MG tablet, Take 1 tablet (100 mg total) by mouth daily., Disp: 30 tablet, Rfl: 1   apixaban  (ELIQUIS ) 2.5 MG TABS tablet, Take 1 tablet (2.5 mg total) by mouth 2 (two) times daily., Disp: 180 tablet, Rfl: 1   atorvastatin  (LIPITOR ) 80 MG tablet, Take 1 tablet (80 mg total) by mouth daily. (Patient taking differently: Take 80 mg by mouth at bedtime.), Disp: 90 tablet, Rfl: 1   empagliflozin  (JARDIANCE ) 10 MG TABS tablet, Take 1 tablet (10 mg total) by mouth daily before breakfast., Disp: 90 tablet, Rfl: 3   finasteride  (PROSCAR ) 5 MG tablet, Take 5 mg by mouth daily., Disp: , Rfl:    linagliptin  (TRADJENTA ) 5 MG TABS tablet, Take 1 tablet (5 mg total) by mouth daily., Disp: 90 tablet, Rfl: 1   metoprolol  succinate (TOPROL -XL) 25 MG 24 hr tablet, TAKE 1 TABLET (25MG ) BY MOUTH DAILY. TAKE WITH OR IMMEDIATELY FOLLOWING A MEAL, Disp: 30 tablet, Rfl: 1   nitroGLYCERIN  (NITROSTAT ) 0.4 MG SL tablet, Place 1 tablet (0.4 mg total) under the tongue every 5 (five) minutes x 3 doses as needed for chest pain., Disp: 25 tablet, Rfl: 12   oxymetazoline  (AFRIN) 0.05 % nasal  spray, Place 1 spray into both nostrils as needed for congestion., Disp: , Rfl:    spironolactone  (ALDACTONE ) 25 MG tablet, Take 1 tablet (25 mg total) by mouth at bedtime., Disp: 90 tablet, Rfl: 1   Review of Systems Review of Systems  Constitutional:  Negative for chills and fever.  Eyes:  Negative for blurred vision and double vision.  Respiratory:  Negative for cough, hemoptysis and shortness of breath.   Cardiovascular:  Negative for chest pain, palpitations and leg swelling.  Gastrointestinal:  Negative for abdominal pain, blood in stool, constipation, diarrhea, melena, nausea and vomiting.  Genitourinary:  Negative for hematuria.  Musculoskeletal:  Negative for myalgias.  Skin:  Negative for itching and  rash.  Neurological:  Negative for dizziness, weakness and headaches.  Endo/Heme/Allergies:  Negative for polydipsia.       Objective:    Vitals BP 120/60   Pulse 78   Temp (!) 97.3 F (36.3 C)   Resp 18   Ht 5\' 9"  (1.753 m)   Wt 174 lb 2 oz (79 kg)   SpO2 97%   BMI 25.71 kg/m    Physical Examination Physical Exam Constitutional:      Appearance: Normal appearance. He is not ill-appearing.  Cardiovascular:     Rate and Rhythm: Normal rate and regular rhythm.     Pulses: Normal pulses.     Heart sounds: No murmur heard.    No friction rub. No gallop.  Pulmonary:     Effort: Pulmonary effort is normal. No respiratory distress.     Breath sounds: No wheezing, rhonchi or rales.  Abdominal:     General: Abdomen is flat. Bowel sounds are normal. There is no distension.     Palpations: Abdomen is soft.     Tenderness: There is no abdominal tenderness.  Musculoskeletal:     Right lower leg: No edema.     Left lower leg: No edema.  Skin:    General: Skin is warm and dry.     Findings: No rash.  Neurological:     General: No focal deficit present.     Mental Status: He is alert and oriented to person, place, and time.  Psychiatric:        Mood and Affect: Mood  normal.        Behavior: Behavior normal.        Assessment & Plan:   Paroxysmal atrial fibrillation (HCC) His A. Fib is rate controlled on his toprol  XL and he remains on amiodarone .  He will continue on anticoagulation.  He is in sinus rhtyhm today.  Diabetes mellitus without complication (HCC) His goal HgBa1c is <7%.  We will check a HgBa1c on him today and adjust his meds if needed.  I want him to continue to eat healthy and continue to be active.  Essential hypertension His BP is controlled today.  We will continue his meds and continue to monitor.  Stage 3b chronic kidney disease (HCC) He has CKD due to DM and HTN.  We will continue to control these risk factors and he will continue to avoid NSAIDS.    Return in about 3 months (around 04/14/2024).   Wayne Haines, MD

## 2024-01-13 NOTE — Assessment & Plan Note (Signed)
 He has CKD due to DM and HTN.  We will continue to control these risk factors and he will continue to avoid NSAIDS.

## 2024-01-13 NOTE — Assessment & Plan Note (Signed)
 His goal HgBa1c is <7%.  We will check a HgBa1c on him today and adjust his meds if needed.  I want him to continue to eat healthy and continue to be active.

## 2024-01-13 NOTE — Assessment & Plan Note (Signed)
 His BP is controlled today.  We will continue his meds and continue to monitor.

## 2024-01-13 NOTE — Assessment & Plan Note (Signed)
 His A. Fib is rate controlled on his toprol  XL and he remains on amiodarone .  He will continue on anticoagulation.  He is in sinus rhtyhm today.

## 2024-01-18 LAB — BASIC METABOLIC PANEL WITH GFR
BUN: 27 mg/dL (ref 8–27)
Calcium: 8.9 mg/dL (ref 8.6–10.2)
Chloride: 108 mmol/L — ABNORMAL HIGH (ref 96–106)
Glucose: 113 mg/dL — ABNORMAL HIGH (ref 70–99)
Sodium: 140 mmol/L (ref 134–144)

## 2024-01-18 LAB — HGB A1C W/O EAG: Hgb A1c MFr Bld: 6.4 % — ABNORMAL HIGH (ref 4.8–5.6)

## 2024-01-24 ENCOUNTER — Other Ambulatory Visit (HOSPITAL_COMMUNITY): Payer: Self-pay | Admitting: Cardiology

## 2024-01-24 MED ORDER — ATORVASTATIN CALCIUM 80 MG PO TABS
80.0000 mg | ORAL_TABLET | Freq: Every day | ORAL | 3 refills | Status: AC
Start: 2024-01-24 — End: ?

## 2024-01-24 MED ORDER — SPIRONOLACTONE 25 MG PO TABS: 25.0000 mg | ORAL_TABLET | Freq: Every day | ORAL | 3 refills | Status: AC

## 2024-01-27 ENCOUNTER — Ambulatory Visit: Payer: Self-pay

## 2024-01-27 NOTE — Progress Notes (Signed)
 Patient called.  Left message for patient to call back.  I have called and left the patient a voicemail to return our phone call. The patient needs to be informed His HgBA1c was good. Aaron Aas

## 2024-01-28 NOTE — Progress Notes (Signed)
 Patient called.  Patient aware.  The patient returned our phone call and I have informed him His HgBA1c was good. Aaron Aas  Pt aware.

## 2024-02-08 ENCOUNTER — Ambulatory Visit: Admitting: Internal Medicine

## 2024-02-08 ENCOUNTER — Encounter: Payer: Self-pay | Admitting: Internal Medicine

## 2024-02-08 ENCOUNTER — Other Ambulatory Visit: Payer: Self-pay | Admitting: Internal Medicine

## 2024-02-08 VITALS — BP 124/74 | HR 91 | Temp 97.3°F | Resp 18 | Ht 69.0 in | Wt 170.2 lb

## 2024-02-08 DIAGNOSIS — N390 Urinary tract infection, site not specified: Secondary | ICD-10-CM | POA: Diagnosis not present

## 2024-02-08 DIAGNOSIS — K5792 Diverticulitis of intestine, part unspecified, without perforation or abscess without bleeding: Secondary | ICD-10-CM | POA: Diagnosis not present

## 2024-02-08 LAB — POCT URINALYSIS DIPSTICK
Bilirubin, UA: NEGATIVE
Blood, UA: NEGATIVE
Glucose, UA: POSITIVE — AB
Ketones, UA: NEGATIVE
Nitrite, UA: NEGATIVE
Protein, UA: POSITIVE — AB
Spec Grav, UA: 1.015 (ref 1.010–1.025)
Urobilinogen, UA: 0.2 U/dL
pH, UA: 5 (ref 5.0–8.0)

## 2024-02-08 MED ORDER — CIPROFLOXACIN HCL 500 MG PO TABS
500.0000 mg | ORAL_TABLET | Freq: Two times a day (BID) | ORAL | 0 refills | Status: AC
Start: 2024-02-08 — End: 2024-02-15

## 2024-02-08 MED ORDER — METRONIDAZOLE 500 MG PO TABS: 500.0000 mg | ORAL_TABLET | Freq: Three times a day (TID) | ORAL | 0 refills | Status: AC

## 2024-02-09 NOTE — Progress Notes (Signed)
 Acute Office Visit  Subjective:     Patient ID: Antonio Clark, male    DOB: [birth year], 88 y.o.   MRN: [id number]  Chief Complaint  Patient presents with   office visit    Patient having abdominal pain    HPI Patient is in today for bilateral lower abdominal pain with pelvic pressure for last 4 days.  He says that the pain is worse in the left lower abdomen.  He also has diarrhea today that he attributes that to eating apple.  He denies having any blood or mucus.  He says that pain is worse 2 days ago but it slightly better now.  Pain get worse when he stand up.  He denies having any burning urination or increased urinary frequency.  No fever or chills.  His urine analysis shows 2+ leukocyte esterase but no nitrites.  Review of Systems  Constitutional: Negative.   Respiratory: Negative.    Cardiovascular: Negative.   Gastrointestinal:  Positive for abdominal pain and diarrhea.        Objective:    BP 124/74   Pulse 91   Temp (!) 97.3 F (36.3 C)   Resp 18   Ht 5' 9 (1.753 m)   Wt 170 lb 4 oz (77.2 kg)   SpO2 98%   BMI 25.14 kg/m    Physical Exam Constitutional:      Appearance: Normal appearance.  Abdominal:     Palpations: Abdomen is soft.     Tenderness: There is abdominal tenderness.     Comments:   Tenderness left lower abdomen.  Neurological:     Mental Status: He is alert.     Results for orders placed or performed in visit on 02/08/24  POCT urinalysis dipstick  Result Value Ref Range   Color, UA yellow    Clarity, UA clear    Glucose, UA Positive (A) Negative   Bilirubin, UA neg    Ketones, UA neg    Spec Grav, UA 1.015 1.010 - 1.025   Blood, UA neg    pH, UA 5.0 5.0 - 8.0   Protein, UA Positive (A) Negative   Urobilinogen, UA 0.2 0.2 or 1.0 E.U./dL   Nitrite, UA neg    Leukocytes, UA Moderate (2+) (A) Negative   Appearance clear    Odor yes         Assessment & Plan:   Problem List Items Addressed This Visit       Other    Diverticulitis     I will start Flagyl 500 mg 3 times a day and Cipro twice a day for 7 days.  If pain get worse then he will call.  If pain is not better may need to do CT scan abdomen.  I will also send his urine for culture as well.      Other Visit Diagnoses       Urinary tract infection without hematuria, site unspecified    -  Primary   Relevant Medications   metroNIDAZOLE (FLAGYL) 500 MG tablet   Other Relevant Orders   POCT urinalysis dipstick (Completed)   Urine Culture       Meds ordered this encounter  Medications   ciprofloxacin (CIPRO) 500 MG tablet    Sig: Take 1 tablet (500 mg total) by mouth 2 (two) times daily for 7 days.    Dispense:  14 tablet    Refill:  0   metroNIDAZOLE (FLAGYL) 500 MG tablet    Sig:  Take 1 tablet (500 mg total) by mouth 3 (three) times daily.    Dispense:  21 tablet    Refill:  0    No follow-ups on file.  Roetta Dare, MD

## 2024-02-09 NOTE — Assessment & Plan Note (Signed)
 I will start Flagyl 500 mg 3 times a day and Cipro twice a day for 7 days.  If pain get worse then he will call.  If pain is not better may need to do CT scan abdomen.  I will also send his urine for culture as well.

## 2024-02-10 LAB — URINE CULTURE: Organism ID, Bacteria: NO GROWTH

## 2024-04-13 ENCOUNTER — Encounter: Payer: Self-pay | Admitting: Internal Medicine

## 2024-04-13 ENCOUNTER — Ambulatory Visit: Admitting: Internal Medicine

## 2024-04-13 VITALS — BP 124/78 | HR 68 | Temp 97.3°F | Resp 18 | Ht 69.0 in | Wt 164.2 lb

## 2024-04-13 DIAGNOSIS — B372 Candidiasis of skin and nail: Secondary | ICD-10-CM | POA: Insufficient documentation

## 2024-04-13 DIAGNOSIS — E1121 Type 2 diabetes mellitus with diabetic nephropathy: Secondary | ICD-10-CM | POA: Diagnosis not present

## 2024-04-13 DIAGNOSIS — I10 Essential (primary) hypertension: Secondary | ICD-10-CM

## 2024-04-13 DIAGNOSIS — N1832 Chronic kidney disease, stage 3b: Secondary | ICD-10-CM | POA: Diagnosis not present

## 2024-04-13 MED ORDER — NYSTATIN 100000 UNIT/GM EX POWD: 1.0000 | Freq: Three times a day (TID) | CUTANEOUS | 1 refills | Status: DC

## 2024-04-13 NOTE — Assessment & Plan Note (Signed)
 His BP is doing well.  We will continue his current BP meds and continue to monitor.

## 2024-04-13 NOTE — Progress Notes (Signed)
 Office Visit  Subjective   Patient ID: Antonio Clark   DOB: May 24, 1936   Age: 88 y.o.   MRN: [id number]   Chief Complaint Chief Complaint  Patient presents with   Follow-up    3 month follow up     History of Present Illness The patient returns today for followup of his T2 diabetes.  He was diagnosed with T2 diabetes years ago.  Since the last visit, there have been no problems. He is currently on:  Jardiance  10mg  daily and Tradjenta  5mg  daily.   He is exercising by walking. He specifically denies unexplained abdominal pain, nausea or vomiting.  He does not routinely check blood sugars. His last HgBA1c was done 3 months ago and was 6.4%.  He has no long term complications of diabetic retinopathy, or neuropathy but he does have a history of CAD and has probable diabetic nephropathy.  His last dilated diabetic eye exam was done with  Dr. Leslee in her office on 11/17/2023 and this showed no evidence of diabetic retinopathy.   The patient also has Stage IIIb CKD which is probably due to his diabetes and hypertension.  It looks like his baseline creatinine is 1.8-2.2 and his GFR 28-35.  The patient denies any NSAID use.  His last kidney function was done on 10/04/2023 and his BUN was 29 and creatinine of 1.4.   The patient is a 88 year old male who presents for a follow-up evaluation of hypertension. Since his last visit, he has not had any problems.  The patient has been checking her blood pressure at home. The patient's blood pressure has ranged systollically 130's. The patient's current medications include: Toprol  XL 25mg  daily and spironolactone  25mg  daily.  The patient has been tolerating her medications well. The patient denies any headache, visual changes, dizziness, lightheadness, chest pain, shortness of breath, weakness/numbness, and edema. She reports there have been no other symptoms noted.       Past Medical History Past Medical History:  Diagnosis Date   Anal fissure    Arthritis     Atrial fibrillation (HCC)    Back pain    CHF (congestive heart failure) (HCC)    Colon polyps    Coronary artery disease    Diabetes mellitus type 2 with complications (HCC)    Dysrhythmia    a-fib   GERD (gastroesophageal reflux disease)    History of echocardiogram 07/ 07/ 2011   History of kidney stones    History of lithotripsy 1989   Hyperlipidemia    Hypertension    Kidney stones    Melanoma (HCC) 05/01/2019   right chest wall (12/20)   Prostate CA (HCC) 2010   Sleep apnea    no CPAP   Squamous cell carcinoma of skin 05/23/1992   bowens-left parietal scalp (CX35FU)   Squamous cell carcinoma of skin 03/14/2008   in situ-left upper outer forehead-medial ([id number])   Squamous cell carcinoma of skin 03/14/2008   in situ-crown of scalp ([id number])   Squamous cell carcinoma of skin 04/15/2011   in situ-right dorsal forearm (txpbx)   Squamous cell carcinoma of skin 10/16/2011   in situ-left sideburn   Squamous cell carcinoma of skin 03/11/2015   ka-left sideburn ([id number])   Squamous cell carcinoma of skin 03/11/2015   ka-left forearm ([id number])   Squamous cell carcinoma of skin 06/22/2018   in situ-left forearm, sup (txpbx)   Squamous cell carcinoma of skin 05/01/2019   in situ-mid anterior scalp  Squamous cell carcinoma of skin 05/01/2019   in situ-right upper arm     Allergies No Known Allergies   Medications  Current Outpatient Medications:    acetaminophen  (TYLENOL ) 325 MG tablet, Take 2 tablets (650 mg total) by mouth every 6 (six) hours as needed for mild pain (or Fever >/= 101)., Disp: , Rfl:    amiodarone  (PACERONE ) 100 MG tablet, Take 1 tablet (100 mg total) by mouth daily., Disp: 30 tablet, Rfl: 1   apixaban  (ELIQUIS ) 2.5 MG TABS tablet, Take 1 tablet (2.5 mg total) by mouth 2 (two) times daily., Disp: 180 tablet, Rfl: 1   atorvastatin  (LIPITOR ) 80 MG tablet, Take 1 tablet (80 mg total) by mouth at bedtime., Disp: 90 tablet, Rfl: 3   empagliflozin   (JARDIANCE ) 10 MG TABS tablet, Take 1 tablet (10 mg total) by mouth daily before breakfast., Disp: 90 tablet, Rfl: 3   finasteride  (PROSCAR ) 5 MG tablet, Take 5 mg by mouth daily., Disp: , Rfl:    linagliptin  (TRADJENTA ) 5 MG TABS tablet, Take 1 tablet (5 mg total) by mouth daily., Disp: 90 tablet, Rfl: 1   metoprolol  succinate (TOPROL -XL) 25 MG 24 hr tablet, TAKE 1 TABLET (25MG ) BY MOUTH DAILY. TAKE WITH OR IMMEDIATELY FOLLOWING A MEAL, Disp: 30 tablet, Rfl: 1   metroNIDAZOLE  (FLAGYL ) 500 MG tablet, Take 1 tablet (500 mg total) by mouth 3 (three) times daily., Disp: 21 tablet, Rfl: 0   nitroGLYCERIN  (NITROSTAT ) 0.4 MG SL tablet, Place 1 tablet (0.4 mg total) under the tongue every 5 (five) minutes x 3 doses as needed for chest pain., Disp: 25 tablet, Rfl: 12   oxymetazoline  (AFRIN) 0.05 % nasal spray, Place 1 spray into both nostrils as needed for congestion., Disp: , Rfl:    spironolactone  (ALDACTONE ) 25 MG tablet, Take 1 tablet (25 mg total) by mouth at bedtime., Disp: 90 tablet, Rfl: 3   Review of Systems Review of Systems  Constitutional:  Negative for chills, fever and malaise/fatigue.  Eyes:  Negative for blurred vision and double vision.  Respiratory:  Negative for cough and shortness of breath.   Cardiovascular:  Negative for chest pain, palpitations and leg swelling.  Gastrointestinal:  Negative for abdominal pain, constipation, diarrhea, nausea and vomiting.  Genitourinary:  Negative for frequency.  Musculoskeletal:  Negative for myalgias.  Skin:  Negative for itching and rash.  Neurological:  Negative for dizziness, weakness and headaches.  Endo/Heme/Allergies:  Negative for polydipsia.       Objective:    Vitals BP 124/78   Pulse 68   Temp (!) 97.3 F (36.3 C)   Resp 18   Ht 5' 9 (1.753 m)   Wt 164 lb 4 oz (74.5 kg)   SpO2 94%   BMI 24.26 kg/m    Physical Examination Physical Exam Constitutional:      Appearance: Normal appearance. He is not ill-appearing.   Cardiovascular:     Rate and Rhythm: Normal rate and regular rhythm.     Pulses: Normal pulses.     Heart sounds: No murmur heard.    No friction rub. No gallop.  Pulmonary:     Effort: Pulmonary effort is normal. No respiratory distress.     Breath sounds: No wheezing, rhonchi or rales.  Abdominal:     General: Abdomen is flat. Bowel sounds are normal. There is no distension.     Palpations: Abdomen is soft.     Tenderness: There is no abdominal tenderness.  Musculoskeletal:     Right  lower leg: No edema.     Left lower leg: No edema.  Skin:    General: Skin is warm and dry.     Findings: No rash.     Comments: He has a red rash of his right groin with red satelitte lesions on the periphery.  Neurological:     Mental Status: He is alert.        Assessment & Plan:   Essential hypertension His BP is doing well.  We will continue his current BP meds and continue to monitor.  Diabetic nephropathy associated with type 2 diabetes mellitus (HCC) His HgBA1c was controlled.  We will check this again in 3 months on his yearly exam.  Stage 3b chronic kidney disease (HCC) We will continue control and management of his diabetes and HTN.  He is to avoid NSAIDS.  Yeast infection of the skin He has a candidial yeast infection of his groin.  We will start him on nystatin  powder.  He is to keep the area dry.    Return in about 14 weeks (around 07/20/2024) for annual.   Selinda Fleeta Finger, MD

## 2024-04-13 NOTE — Assessment & Plan Note (Signed)
 His HgBA1c was controlled.  We will check this again in 3 months on his yearly exam.

## 2024-04-13 NOTE — Assessment & Plan Note (Signed)
 We will continue control and management of his diabetes and HTN.  He is to avoid NSAIDS.

## 2024-04-13 NOTE — Assessment & Plan Note (Signed)
 He has a candidial yeast infection of his groin.  We will start him on nystatin  powder.  He is to keep the area dry.

## 2024-04-25 ENCOUNTER — Other Ambulatory Visit: Payer: Self-pay

## 2024-04-25 MED ORDER — NYSTATIN 100000 UNIT/GM EX POWD: 1.0000 | Freq: Three times a day (TID) | CUTANEOUS | 1 refills | Status: AC

## 2024-06-06 ENCOUNTER — Other Ambulatory Visit: Payer: Self-pay | Admitting: Cardiology

## 2024-06-06 DIAGNOSIS — I480 Paroxysmal atrial fibrillation: Secondary | ICD-10-CM

## 2024-06-06 MED ORDER — APIXABAN 2.5 MG PO TABS
2.5000 mg | ORAL_TABLET | Freq: Two times a day (BID) | ORAL | 1 refills | Status: AC
Start: 2024-06-06 — End: ?

## 2024-06-06 NOTE — Telephone Encounter (Signed)
 Prescription refill request for Eliquis  received. Indication:afib Last office visit:5/25 Scr:1.49  2/25 Age: 88 Weight:74.5  Prescription refilled

## 2024-06-12 ENCOUNTER — Other Ambulatory Visit: Payer: Self-pay | Admitting: Cardiology

## 2024-06-12 ENCOUNTER — Other Ambulatory Visit (HOSPITAL_COMMUNITY): Payer: Self-pay | Admitting: Cardiology

## 2024-06-14 MED ORDER — EMPAGLIFLOZIN 10 MG PO TABS: 10.0000 mg | ORAL_TABLET | Freq: Every day | ORAL | 1 refills | Status: AC

## 2024-07-10 ENCOUNTER — Other Ambulatory Visit (HOSPITAL_COMMUNITY): Payer: Self-pay | Admitting: Cardiology

## 2024-07-10 DIAGNOSIS — I5022 Chronic systolic (congestive) heart failure: Secondary | ICD-10-CM

## 2024-07-10 MED ORDER — AMIODARONE HCL 100 MG PO TABS: 100.0000 mg | ORAL_TABLET | Freq: Every day | ORAL | 0 refills | Status: DC

## 2024-07-10 NOTE — Addendum Note (Signed)
 Addended by: JERONA DALTON HERO on: 07/10/2024 02:14 PM   Modules accepted: Orders

## 2024-07-20 ENCOUNTER — Encounter: Admitting: Internal Medicine

## 2024-09-11 ENCOUNTER — Other Ambulatory Visit (HOSPITAL_COMMUNITY): Payer: Self-pay | Admitting: Cardiology

## 2024-09-11 DIAGNOSIS — I5022 Chronic systolic (congestive) heart failure: Secondary | ICD-10-CM

## 2024-09-12 ENCOUNTER — Telehealth (HOSPITAL_COMMUNITY): Payer: Self-pay

## 2024-09-12 NOTE — Telephone Encounter (Signed)
 error
